# Patient Record
Sex: Male | Born: 1955
Health system: Southern US, Community
[De-identification: ages and names within clinical notes are randomized; demographics above are authoritative.]

## PROBLEM LIST (undated history)

## (undated) DIAGNOSIS — G8929 Other chronic pain: Secondary | ICD-10-CM

## (undated) DIAGNOSIS — H35039 Hypertensive retinopathy, unspecified eye: Secondary | ICD-10-CM

## (undated) DIAGNOSIS — Z1509 Genetic susceptibility to other malignant neoplasm: Secondary | ICD-10-CM

## (undated) DIAGNOSIS — Z86718 Personal history of other venous thrombosis and embolism: Secondary | ICD-10-CM

## (undated) DIAGNOSIS — I10 Essential (primary) hypertension: Secondary | ICD-10-CM

## (undated) DIAGNOSIS — F419 Anxiety disorder, unspecified: Secondary | ICD-10-CM

## (undated) DIAGNOSIS — N200 Calculus of kidney: Secondary | ICD-10-CM

## (undated) DIAGNOSIS — I2510 Atherosclerotic heart disease of native coronary artery without angina pectoris: Secondary | ICD-10-CM

## (undated) DIAGNOSIS — F17200 Nicotine dependence, unspecified, uncomplicated: Secondary | ICD-10-CM

## (undated) DIAGNOSIS — M1990 Unspecified osteoarthritis, unspecified site: Secondary | ICD-10-CM

## (undated) DIAGNOSIS — H269 Unspecified cataract: Secondary | ICD-10-CM

## (undated) DIAGNOSIS — D039 Melanoma in situ, unspecified: Secondary | ICD-10-CM

## (undated) DIAGNOSIS — K219 Gastro-esophageal reflux disease without esophagitis: Secondary | ICD-10-CM

## (undated) DIAGNOSIS — C649 Malignant neoplasm of unspecified kidney, except renal pelvis: Secondary | ICD-10-CM

## (undated) DIAGNOSIS — Z9289 Personal history of other medical treatment: Secondary | ICD-10-CM

## (undated) DIAGNOSIS — Z981 Arthrodesis status: Secondary | ICD-10-CM

## (undated) DIAGNOSIS — F32A Depression, unspecified: Secondary | ICD-10-CM

## (undated) DIAGNOSIS — F329 Major depressive disorder, single episode, unspecified: Secondary | ICD-10-CM

## (undated) DIAGNOSIS — Z79899 Other long term (current) drug therapy: Secondary | ICD-10-CM

## (undated) DIAGNOSIS — J449 Chronic obstructive pulmonary disease, unspecified: Secondary | ICD-10-CM

## (undated) DIAGNOSIS — G709 Myoneural disorder, unspecified: Secondary | ICD-10-CM

## (undated) DIAGNOSIS — C4359 Malignant melanoma of other part of trunk: Secondary | ICD-10-CM

## (undated) DIAGNOSIS — M549 Dorsalgia, unspecified: Secondary | ICD-10-CM

## (undated) DIAGNOSIS — D219 Benign neoplasm of connective and other soft tissue, unspecified: Secondary | ICD-10-CM

## (undated) DIAGNOSIS — E559 Vitamin D deficiency, unspecified: Secondary | ICD-10-CM

## (undated) DIAGNOSIS — C159 Malignant neoplasm of esophagus, unspecified: Secondary | ICD-10-CM

## (undated) DIAGNOSIS — E785 Hyperlipidemia, unspecified: Secondary | ICD-10-CM

## (undated) DIAGNOSIS — IMO0002 Reserved for concepts with insufficient information to code with codable children: Secondary | ICD-10-CM

## (undated) DIAGNOSIS — K635 Polyp of colon: Secondary | ICD-10-CM

## (undated) HISTORY — DX: Melanoma in situ, unspecified: D03.9

## (undated) HISTORY — DX: Other long term (current) drug therapy: Z79.899

## (undated) HISTORY — PX: ANTERIOR CRUCIATE LIGAMENT REPAIR: SHX115

## (undated) HISTORY — DX: Unspecified osteoarthritis, unspecified site: M19.90

## (undated) HISTORY — PX: MELANOMA EXCISION: SHX5266

## (undated) HISTORY — DX: Personal history of other venous thrombosis and embolism: Z86.718

## (undated) HISTORY — DX: Gastro-esophageal reflux disease without esophagitis: K21.9

## (undated) HISTORY — DX: Essential (primary) hypertension: I10

## (undated) HISTORY — DX: Hyperlipidemia, unspecified: E78.5

## (undated) HISTORY — DX: Polyp of colon: K63.5

## (undated) HISTORY — DX: Hypertensive retinopathy, unspecified eye: H35.039

## (undated) HISTORY — PX: HAND LIGAMENT RECONSTRUCTION: SHX1726

## (undated) HISTORY — DX: Anxiety disorder, unspecified: F41.9

## (undated) HISTORY — DX: Malignant neoplasm of esophagus, unspecified: C15.9

## (undated) HISTORY — DX: Arthrodesis status: Z98.1

## (undated) HISTORY — DX: Reserved for concepts with insufficient information to code with codable children: IMO0002

## (undated) HISTORY — PX: CARDIAC CATHETERIZATION: SHX172

## (undated) HISTORY — DX: Myoneural disorder, unspecified: G70.9

## (undated) HISTORY — DX: Unspecified cataract: H26.9

## (undated) HISTORY — PX: LIGAMENT REPAIR: SHX5444

## (undated) HISTORY — DX: Benign neoplasm of connective and other soft tissue, unspecified: D21.9

## (undated) HISTORY — DX: Vitamin D deficiency, unspecified: E55.9

## (undated) HISTORY — PX: SPINE SURGERY: SHX786

## (undated) HISTORY — DX: Chronic obstructive pulmonary disease, unspecified: J44.9

## (undated) HISTORY — DX: Calculus of kidney: N20.0

## (undated) HISTORY — DX: Nicotine dependence, unspecified, uncomplicated: F17.200

## (undated) LAB — BASIC METABOLIC PANEL: Sodium: 141 (ref 137–147)

---
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
## 1990-06-19 HISTORY — PX: HARVEST BONE GRAFT: SHX377

## 1990-06-19 HISTORY — PX: ANTERIOR CERVICAL DECOMP/DISCECTOMY FUSION: SHX1161

## 1998-05-04 ENCOUNTER — Ambulatory Visit: Admission: RE | Admit: 1998-05-04 | Discharge: 1998-05-04 | Payer: Self-pay | Admitting: Internal Medicine

## 1999-06-27 ENCOUNTER — Ambulatory Visit (HOSPITAL_COMMUNITY): Admission: RE | Admit: 1999-06-27 | Discharge: 1999-06-27 | Payer: Self-pay | Admitting: Orthopedic Surgery

## 1999-06-27 ENCOUNTER — Encounter: Payer: Self-pay | Admitting: Orthopedic Surgery

## 1999-07-12 ENCOUNTER — Ambulatory Visit (HOSPITAL_BASED_OUTPATIENT_CLINIC_OR_DEPARTMENT_OTHER): Admission: RE | Admit: 1999-07-12 | Discharge: 1999-07-12 | Payer: Self-pay | Admitting: Orthopedic Surgery

## 2002-06-19 HISTORY — PX: INGUINAL HERNIA REPAIR: SUR1180

## 2003-01-30 ENCOUNTER — Ambulatory Visit (HOSPITAL_BASED_OUTPATIENT_CLINIC_OR_DEPARTMENT_OTHER): Admission: RE | Admit: 2003-01-30 | Discharge: 2003-01-30 | Payer: Self-pay

## 2006-03-16 ENCOUNTER — Ambulatory Visit: Payer: Self-pay | Admitting: Internal Medicine

## 2006-03-29 ENCOUNTER — Ambulatory Visit: Payer: Self-pay | Admitting: Internal Medicine

## 2006-03-29 ENCOUNTER — Encounter (INDEPENDENT_AMBULATORY_CARE_PROVIDER_SITE_OTHER): Payer: Self-pay | Admitting: *Deleted

## 2008-09-26 ENCOUNTER — Emergency Department (HOSPITAL_COMMUNITY): Admission: EM | Admit: 2008-09-26 | Discharge: 2008-09-27 | Payer: Self-pay | Admitting: Emergency Medicine

## 2008-09-26 IMAGING — CR DG CHEST 2V
2 series · 2 of 2 positions shown · non-contrast
Comparison: None

CLINICAL DATA: Cough

CHEST - 2 VIEW

[w chest pa]
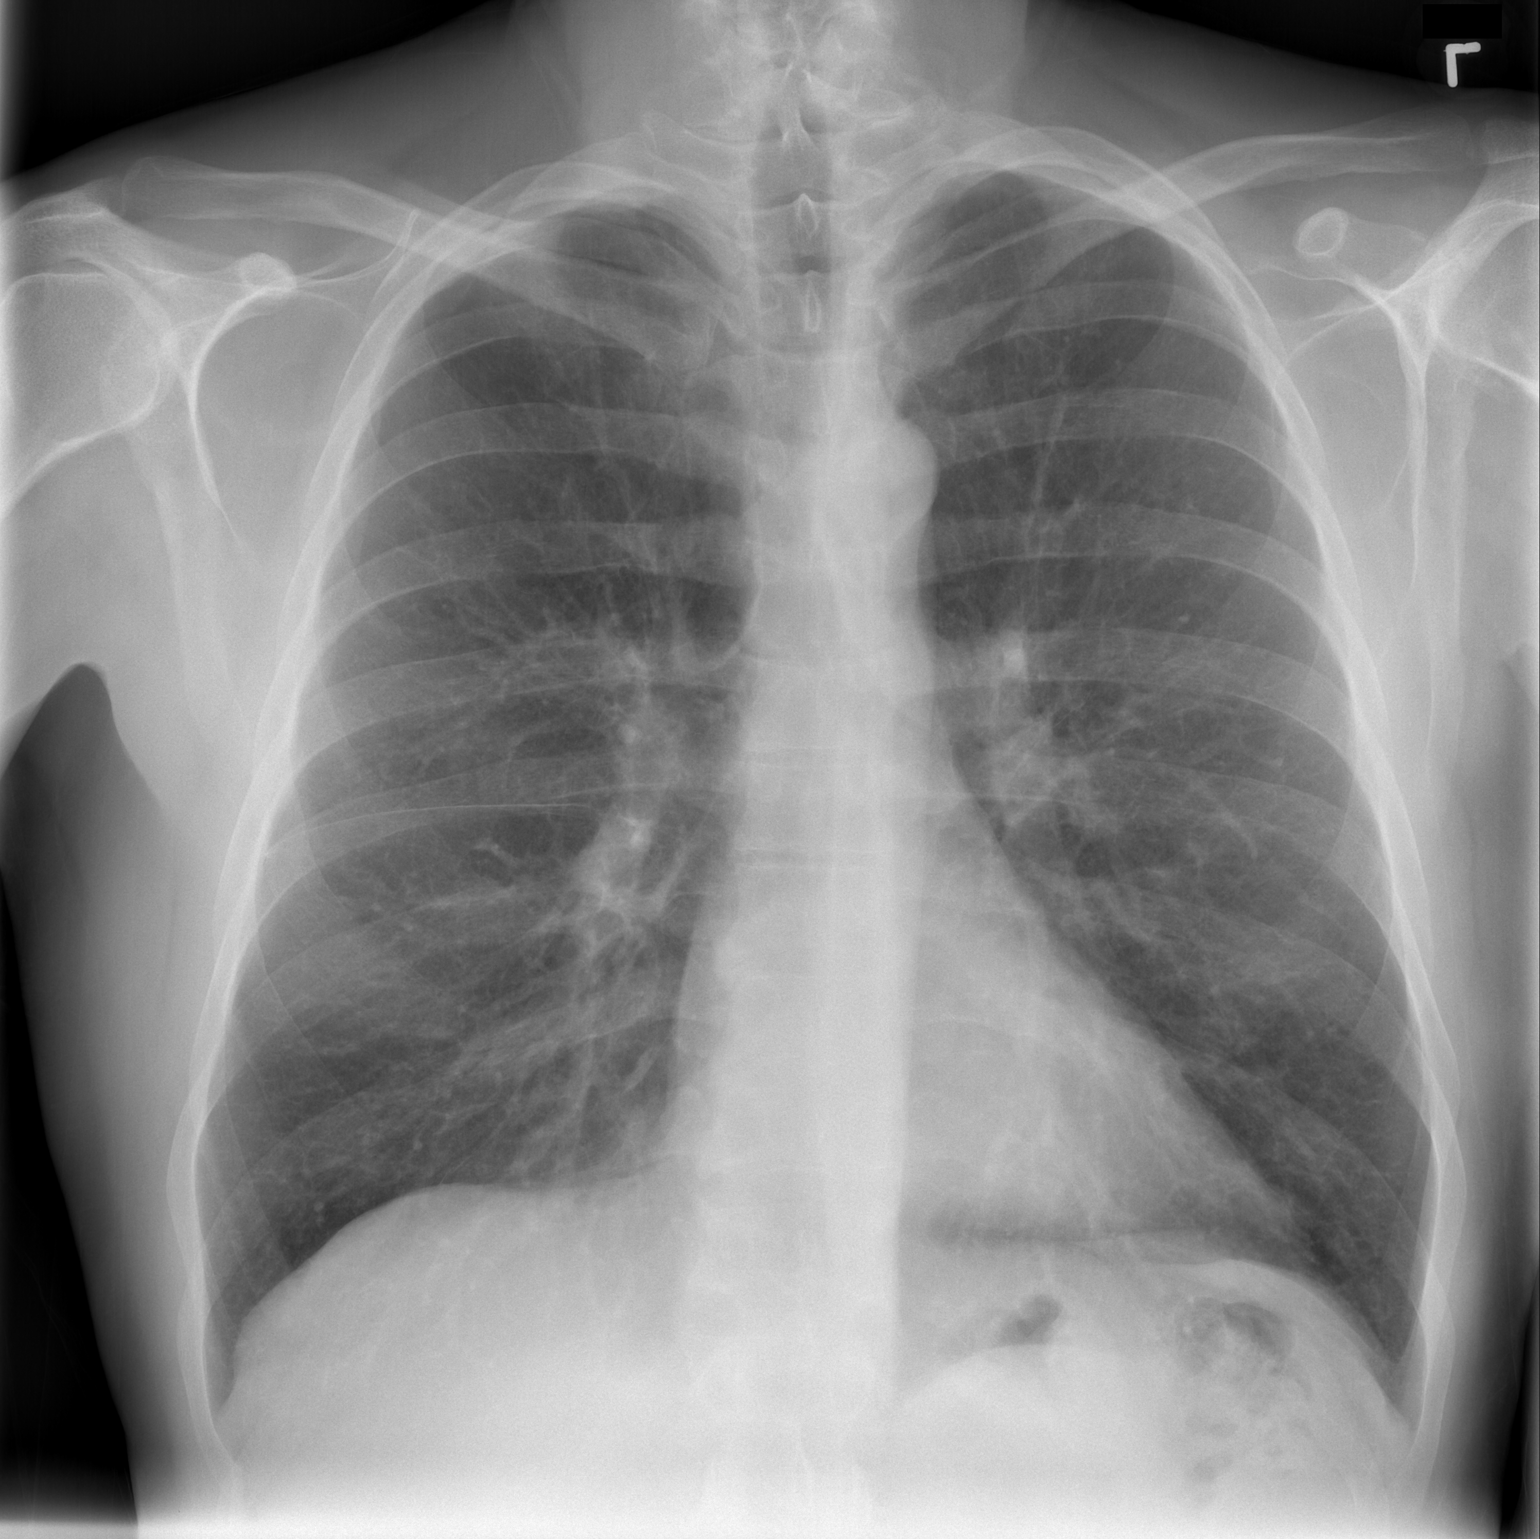

[w chest lat]
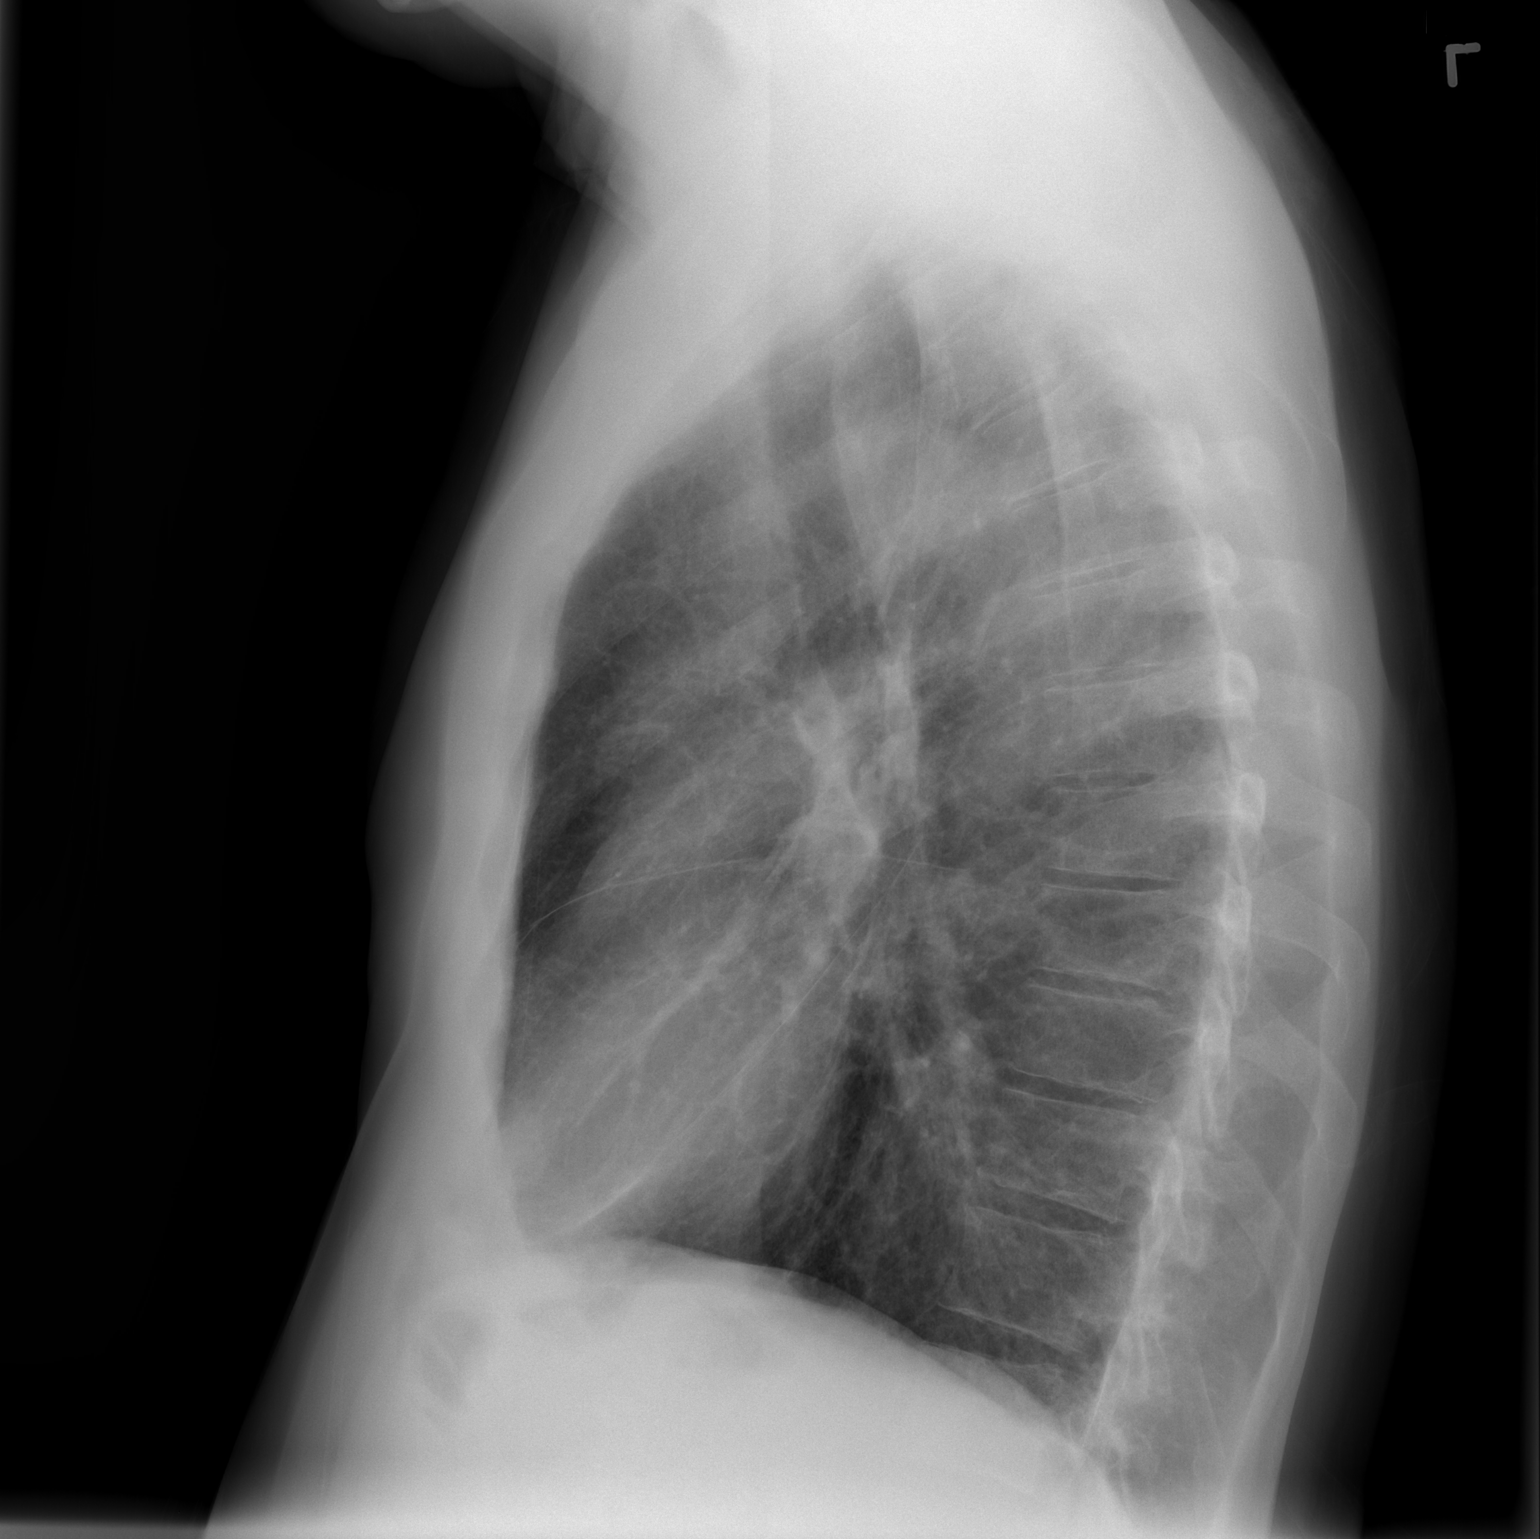

[2 of 2 positions shown; findings below may reference images not displayed]

FINDINGS: The lungs are clear.  The heart and mediastinal contours
are normal.  Osseous structures unremarkable.
IMPRESSION: No acute cardiopulmonary process.

## 2008-10-16 ENCOUNTER — Encounter: Admission: RE | Admit: 2008-10-16 | Discharge: 2008-10-16 | Payer: Self-pay | Admitting: Emergency Medicine

## 2008-10-16 IMAGING — CT CT CHEST W/O CM
2 of 3 series · 15 of 36 positions shown, 18 images · non-contrast
Comparison: Chest x-ray [DATE]

CLINICAL DATA: Right lower lobe pulmonary nodule.

CT CHEST WITHOUT CONTRAST
TECHNIQUE: Multidetector CT imaging of the chest was performed
following the standard protocol without IV contrast.

[Series 2: wo · axial · 0.73mm/px · z∈[+1258,+1578]mm · 12 of 76 slices shown, 15 images]
[im 6/76  mediastinal]
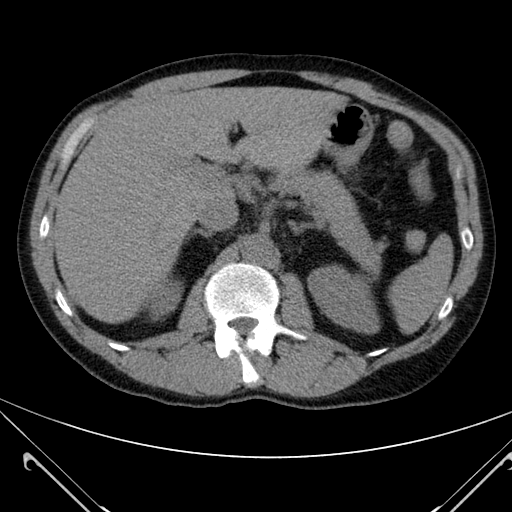
[im 6/76  lung]
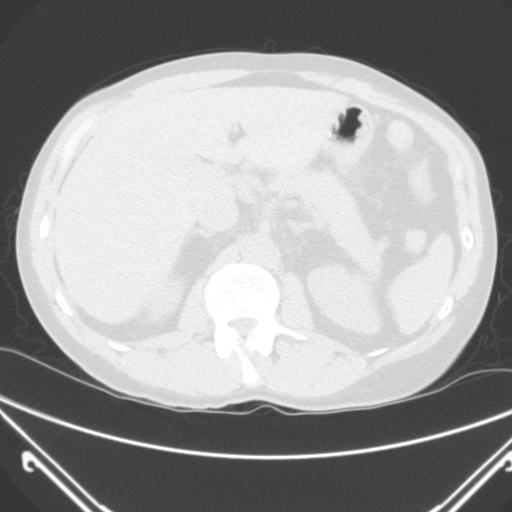
[im 12/76  lung]
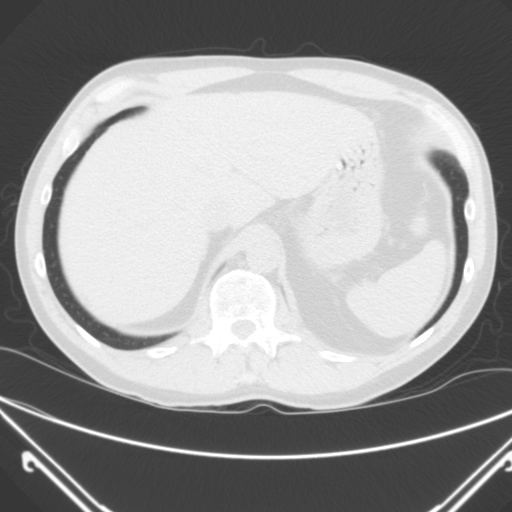
[im 17/76  lung]
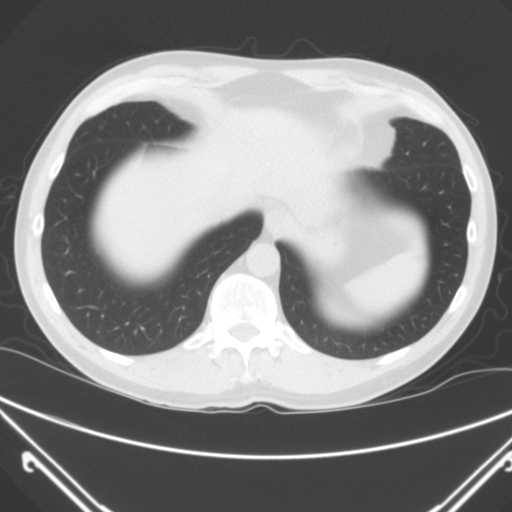
[im 23/76  lung]
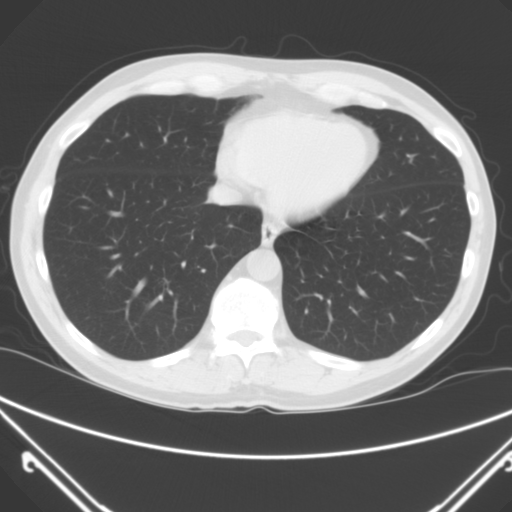
[im 28/76  mediastinal]
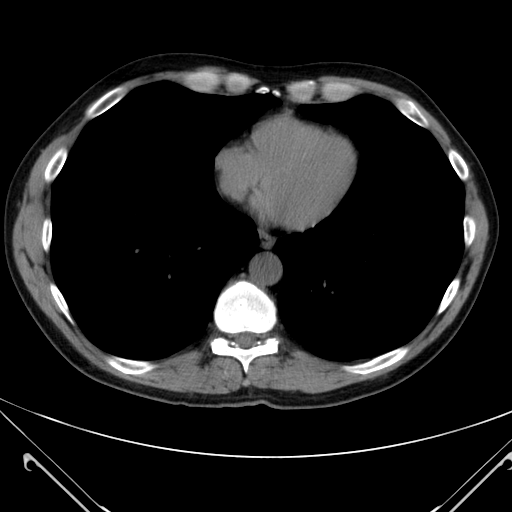
[im 28/76  lung]
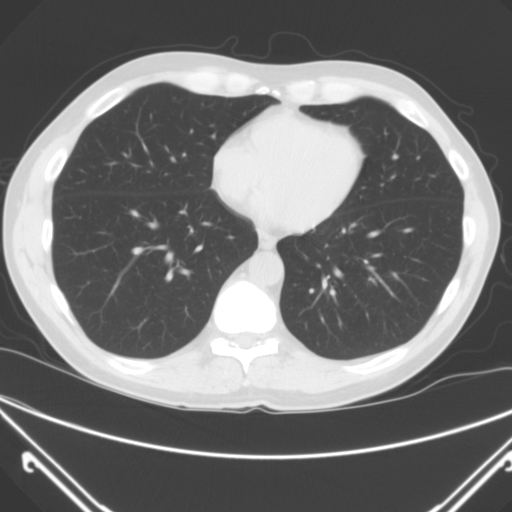
[im 34/76  lung]
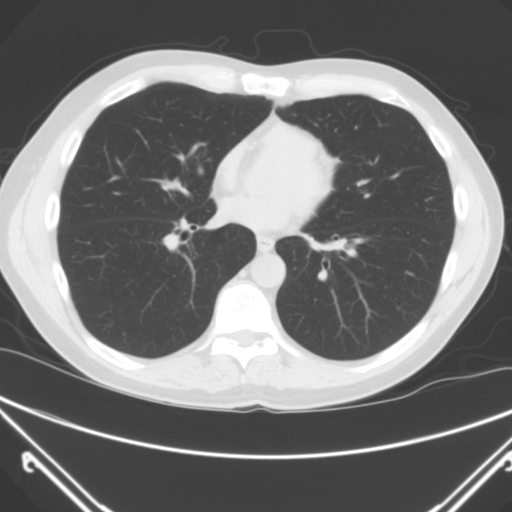
[im 42/76  lung]
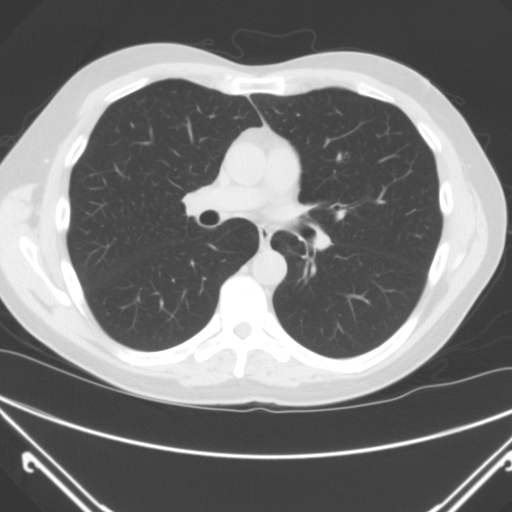
[im 48/76  lung]
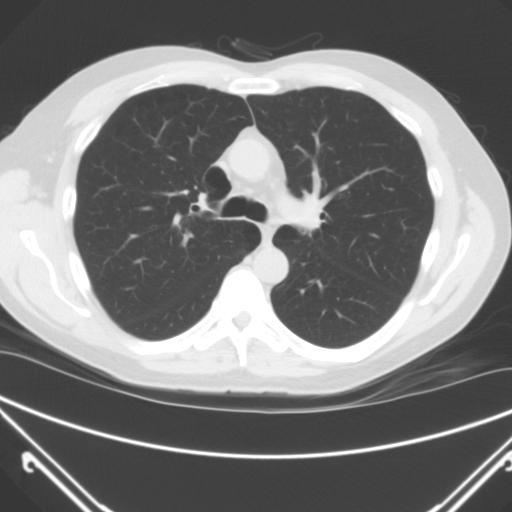
[im 53/76  mediastinal]
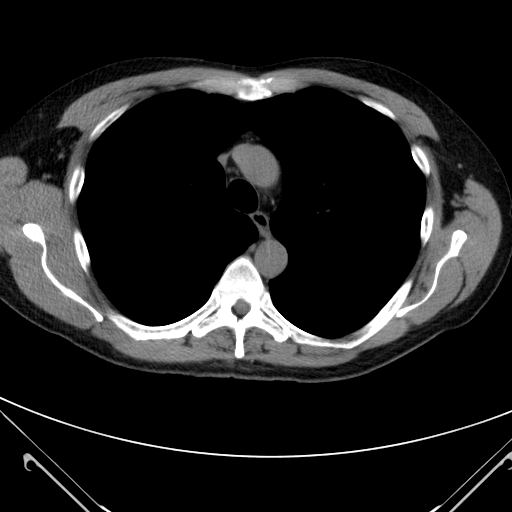
[im 53/76  lung]
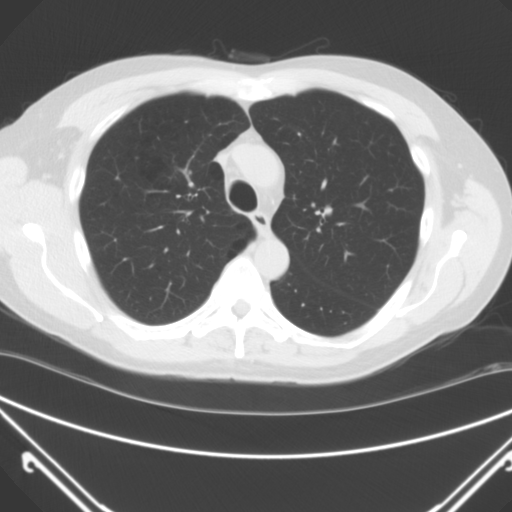
[im 59/76  lung]
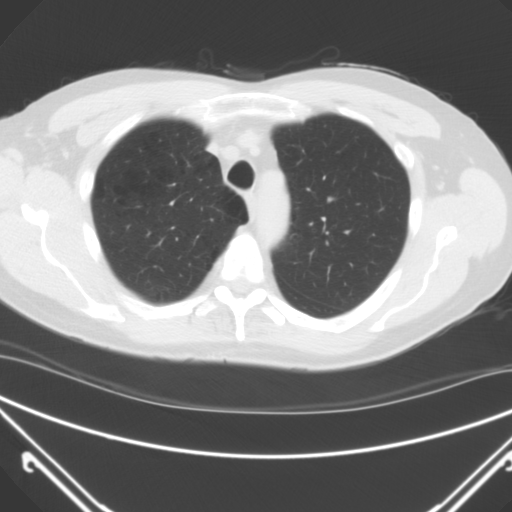
[im 64/76  lung]
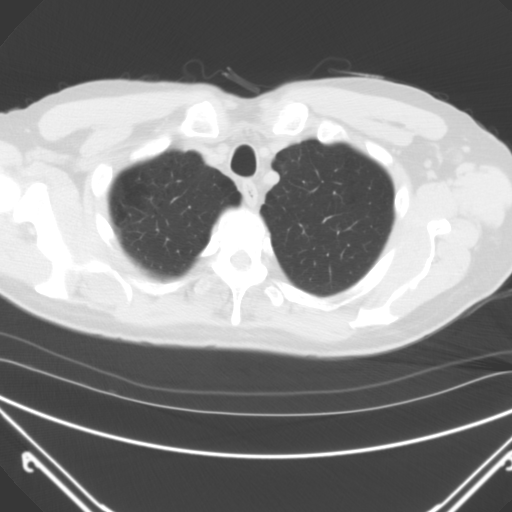
[im 70/76  lung]
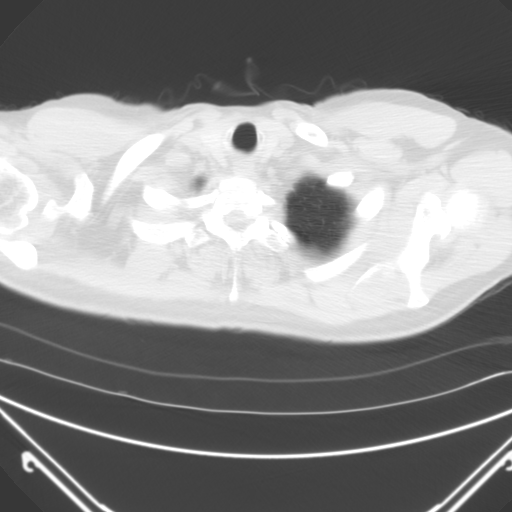

[coronals · coronal · 0.73mm/px · 3 of 100 slices shown]
[im 20/100  lung]
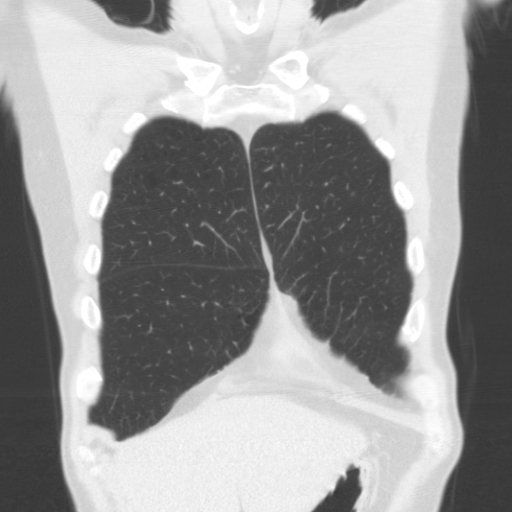
[im 40/100  lung]
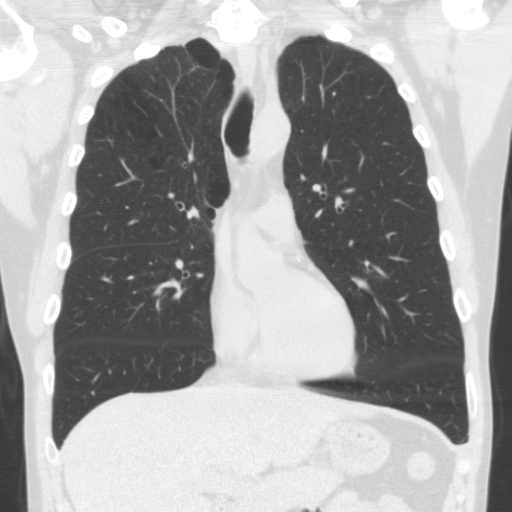
[im 60/100  lung]
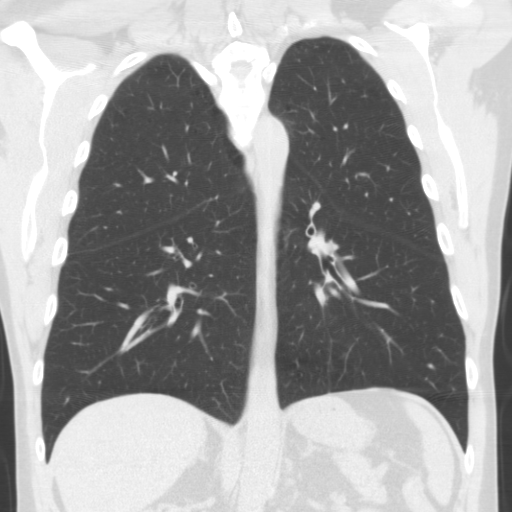

[15 of 36 positions shown; findings below may reference images not displayed]

FINDINGS: The chest wall is unremarkable.  No supraclavicular or
axillary adenopathy.  The bony thorax is intact.

The heart is normal in size.  No pericardial effusion.  No
mediastinal or hilar adenopathy.  The aorta is normal in caliber.
There are a few scattered coronary artery calcifications noted.
The esophagus is unremarkable.

Examination of the lung parenchyma demonstrates no acute pulmonary
findings.  There are early changes of COPD in the upper lobes
bilaterally with bullous changes.  No pulmonary mass lesions or
worrisome pulmonary nodules.  No pleural effusions.  The
tracheobronchial tree appears normal.

The upper abdomen demonstrates no significant findings.
IMPRESSION: 1.  No pulmonary mass lesions or nodules are identified.
2.  COPD changes.
3.  Normal CT appearance of the heart and great vessels.
4.  No mediastinal or hilar adenopathy.

## 2009-02-05 ENCOUNTER — Encounter: Admission: RE | Admit: 2009-02-05 | Discharge: 2009-02-05 | Payer: Self-pay | Admitting: Emergency Medicine

## 2009-02-05 IMAGING — CT CT PARANASAL SINUSES LIMITED
1 series · 15 of 17 positions shown, 19 images · non-contrast
Comparison: None

CLINICAL DATA: Chronic congestion, history of reconstructive nasal
surgery

CT PARANASAL SINUS LIMITED WITHOUT CONTRAST
TECHNIQUE: Multidetector CT images of the paranasal sinuses were
obtained in a single plane without contrast.

[Series 2: sinus · axial · 0.33mm/px · z∈[-352,-251]mm · 15 of 17 slices shown, 19 images]
[im 2/17  brain]
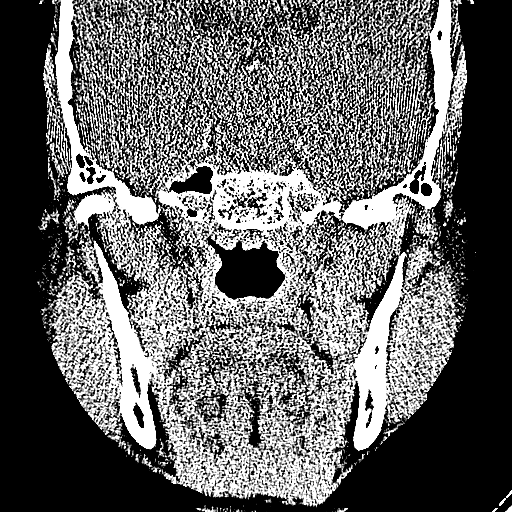
[im 2/17  bone]
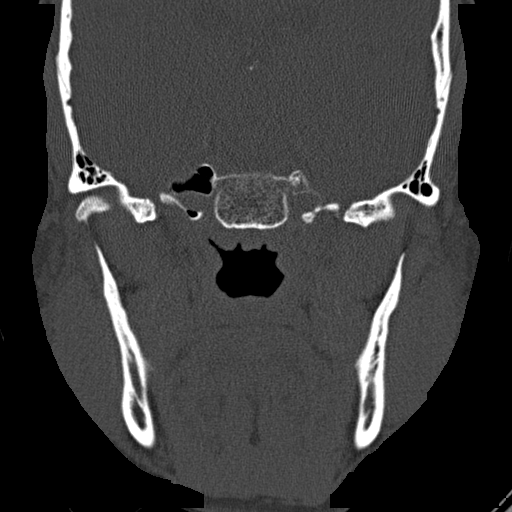
[im 3/17  bone]
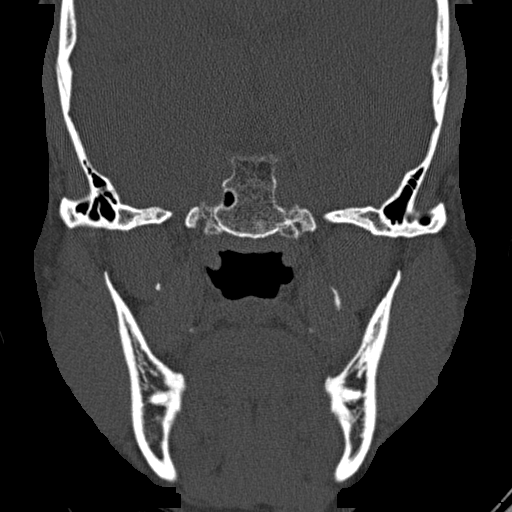
[im 4/17  bone]
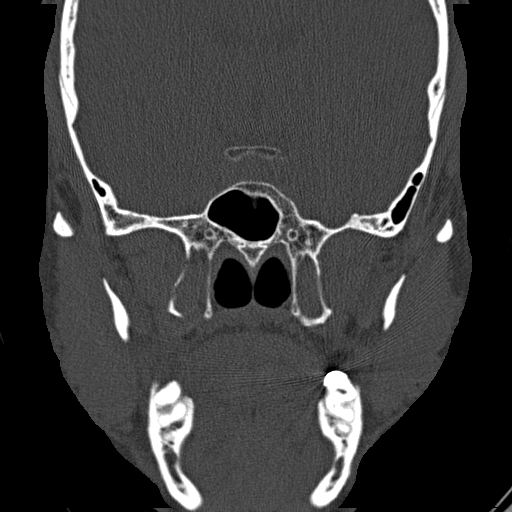
[im 5/17  bone]
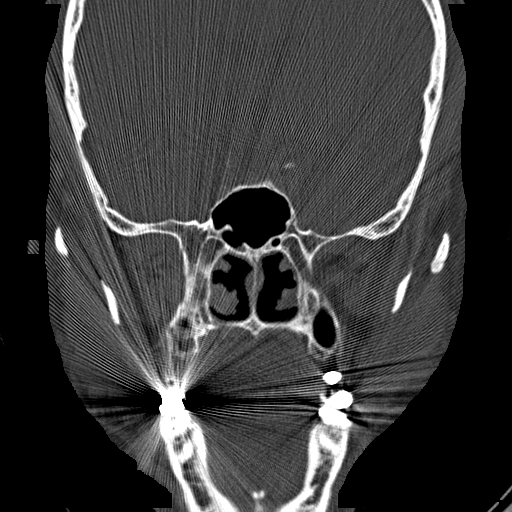
[im 6/17  brain]
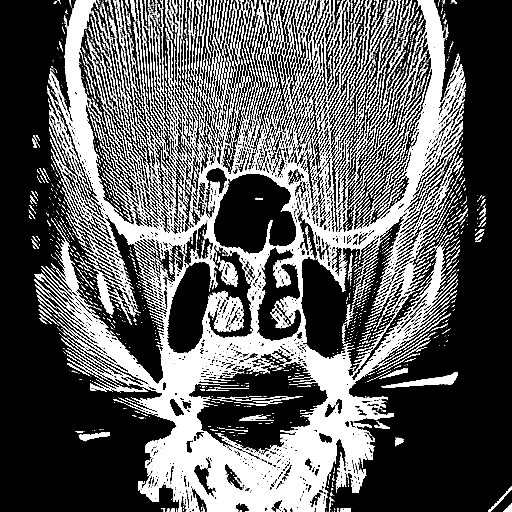
[im 6/17  bone]
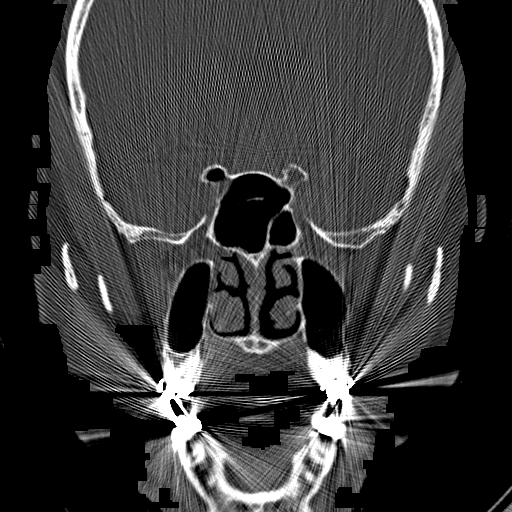
[im 7/17  bone]
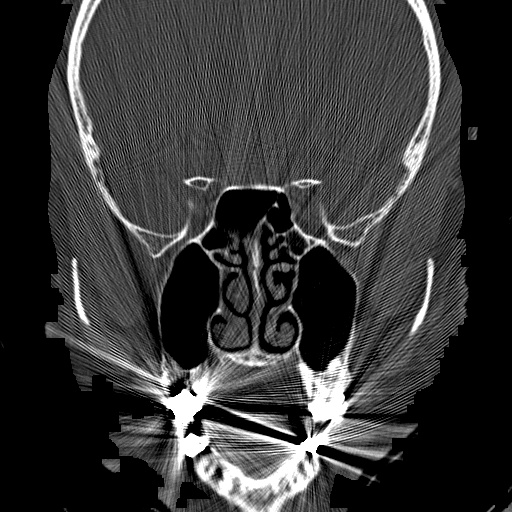
[im 8/17  bone]
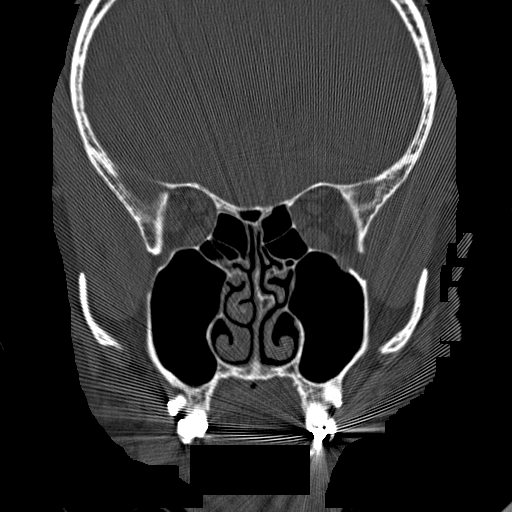
[im 9/17  bone]
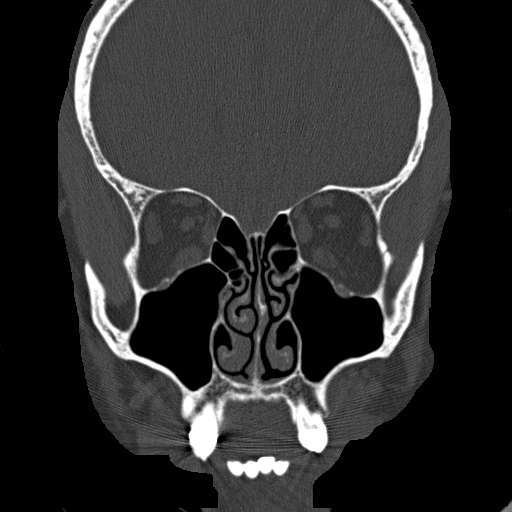
[im 10/17  brain]
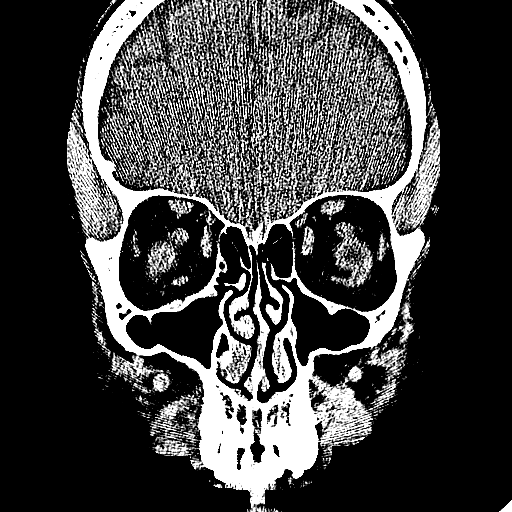
[im 10/17  bone]
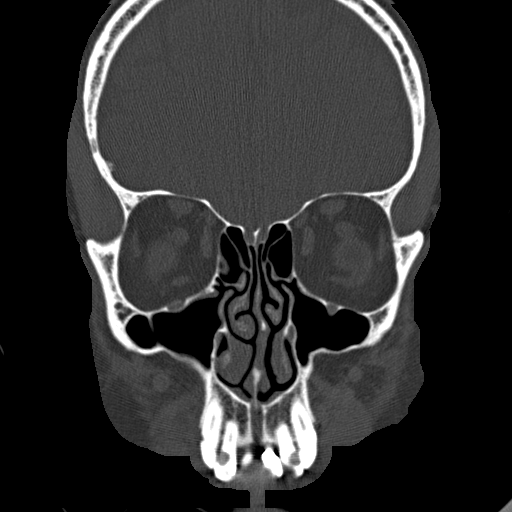
[im 11/17  bone]
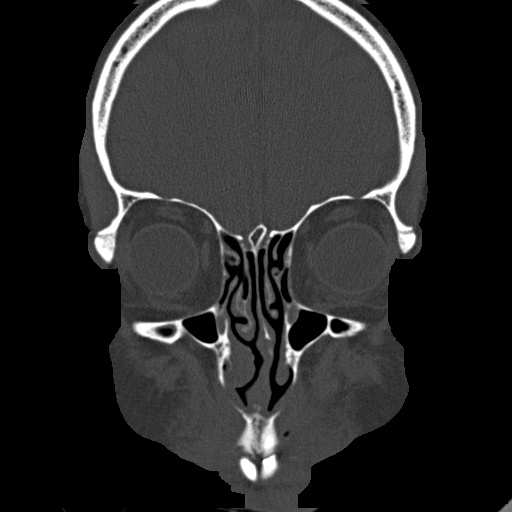
[im 12/17  bone]
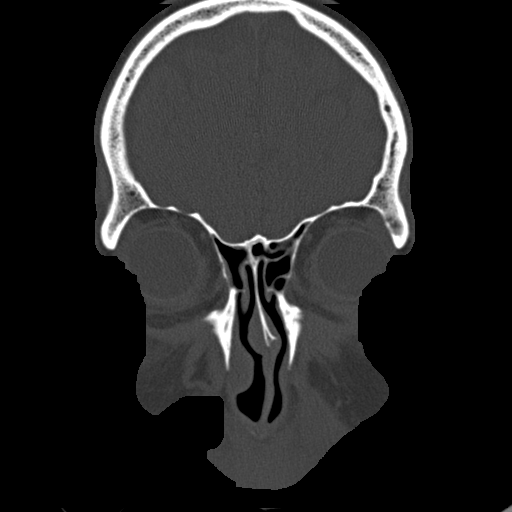
[im 13/17  bone]
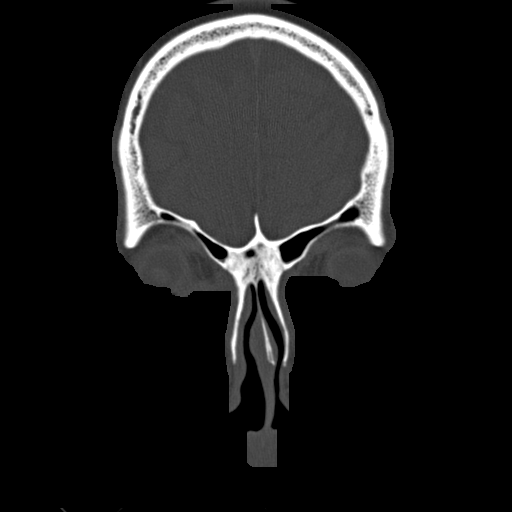
[im 14/17  brain]
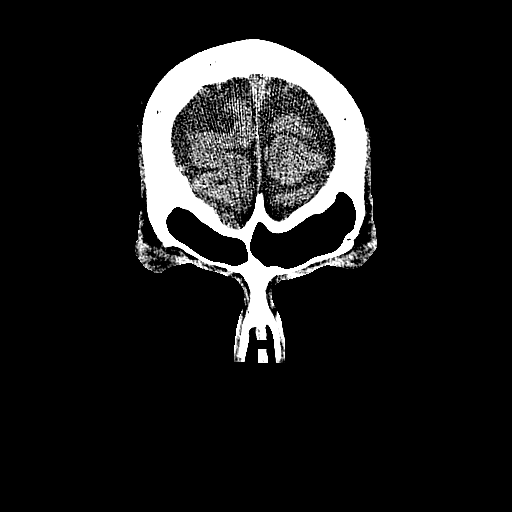
[im 14/17  bone]
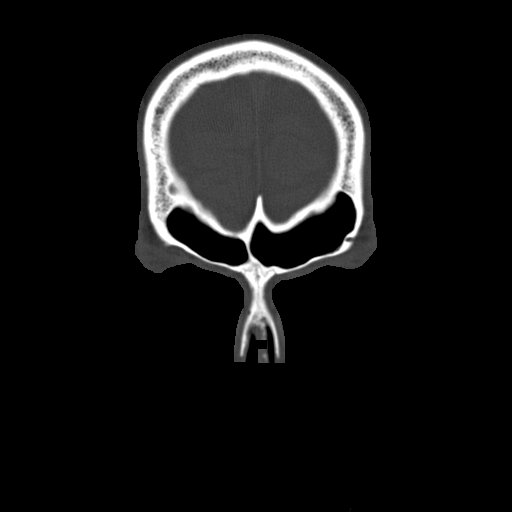
[im 15/17  bone]
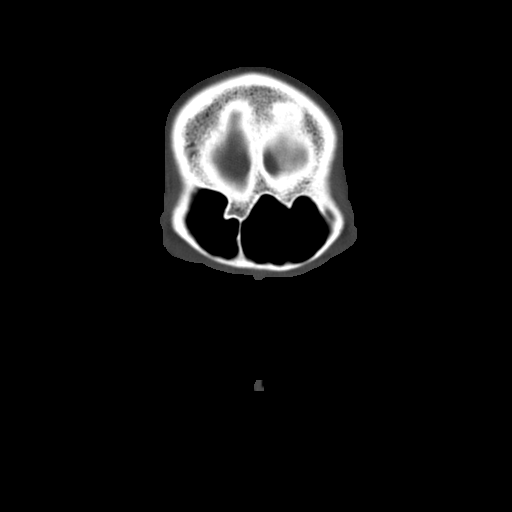
[im 16/17  bone]
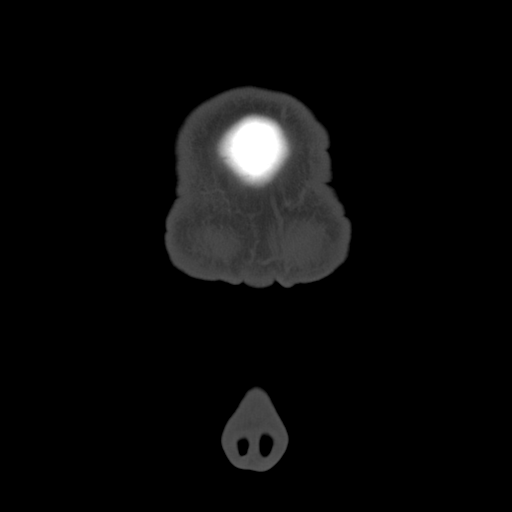

[15 of 17 positions shown; findings below may reference images not displayed]

FINDINGS: The paranasal sinuses are well pneumatized.  There is no
evidence of sinusitis.  The nasal turbinates are slightly
prominent, somewhat compromising the nasal airway, right more so
than left.  No bony abnormality is seen.
IMPRESSION: 1.  No evidence of sinusitis.
2.  Slight narrowing of the right nasal airway by somewhat
prominent turbinates.

## 2010-02-04 ENCOUNTER — Encounter: Admission: RE | Admit: 2010-02-04 | Discharge: 2010-02-04 | Payer: Self-pay | Admitting: Emergency Medicine

## 2010-02-04 IMAGING — CT CT ABD-PELV W/ CM
2 of 5 series · 16 of 46 positions shown, 18 images · IV contrast (READICAT/WATER & [ID] OMNI 300)
Comparison: None.

CLINICAL DATA: Recent skin biopsy from the left upper arm
suspicious for leiomyosarcoma.  Unknown primary.  History of
melanoma removed from his back approximately 4 years ago.  Surgical
history includes right inguinal hernia repair.

CT ABDOMEN AND PELVIS WITH CONTRAST [DATE]:
TECHNIQUE: Multidetector CT imaging of the abdomen and pelvis was
performed following the standard protocol during bolus
administration of intravenous contrast.
Contrast: 100 ml [L7] IV.  Dilute Omnipaque as oral
contrast.

[Series 2: abdomen w/ · axial · 0.75mm/px · z∈[-439,-84]mm · 13 of 80 slices shown, 15 images]
[im 5/80  soft-tissue]
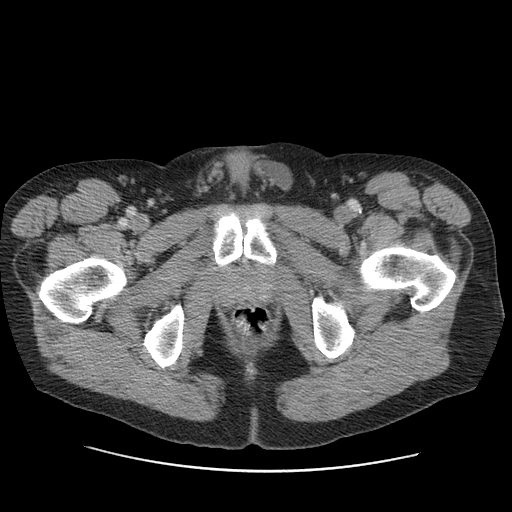
[im 5/80  bone]
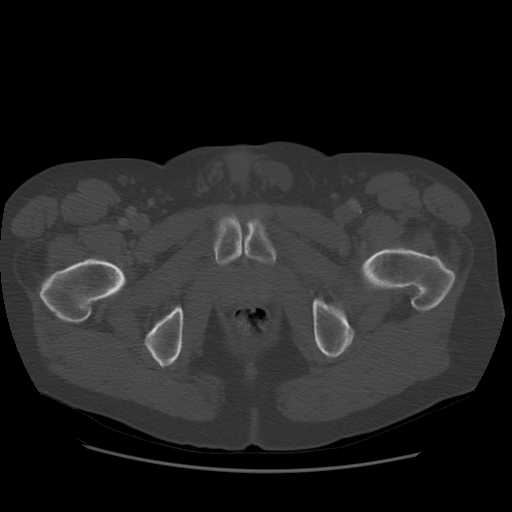
[im 9/80  soft-tissue]
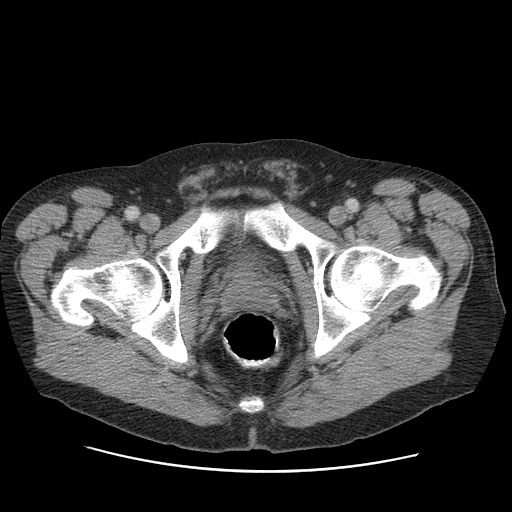
[im 18/80  soft-tissue]
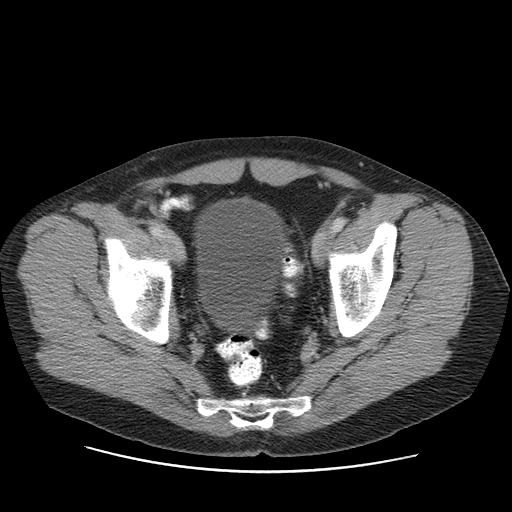
[im 22/80  soft-tissue]
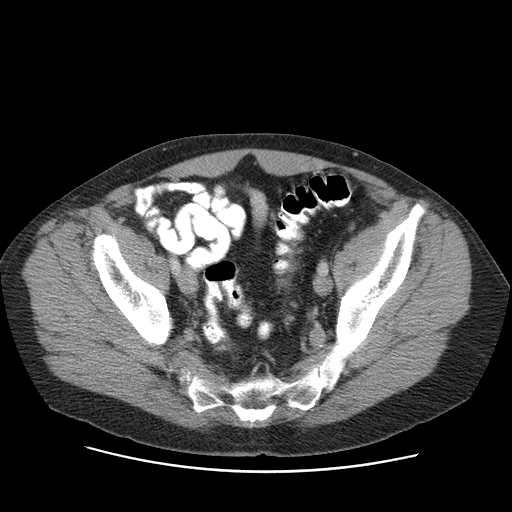
[im 27/80  soft-tissue]
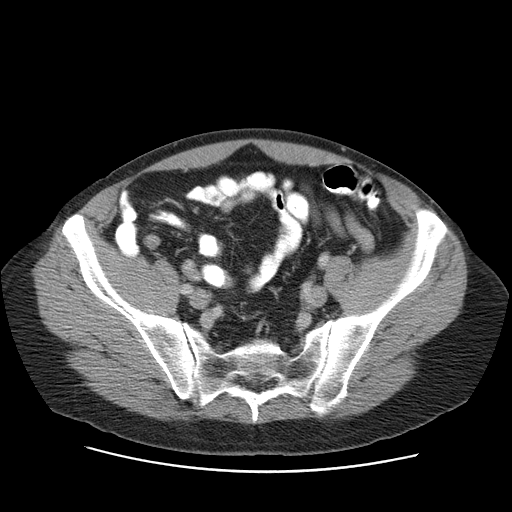
[im 36/80  soft-tissue]
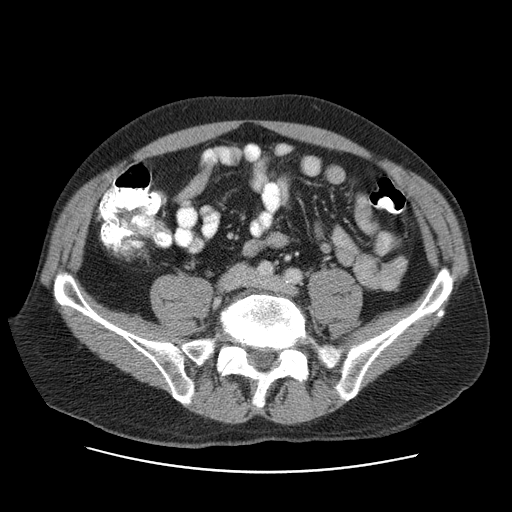
[im 40/80  soft-tissue]
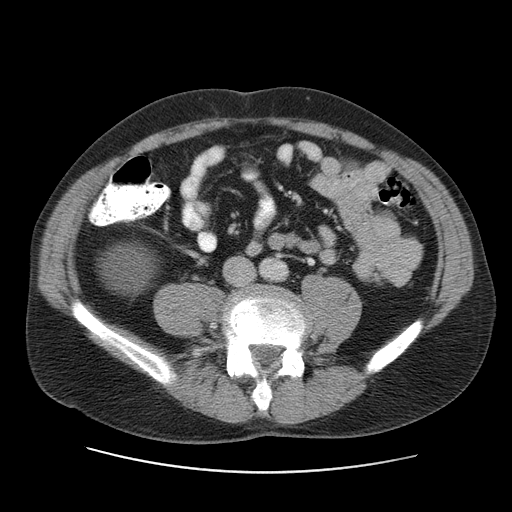
[im 44/80  soft-tissue]
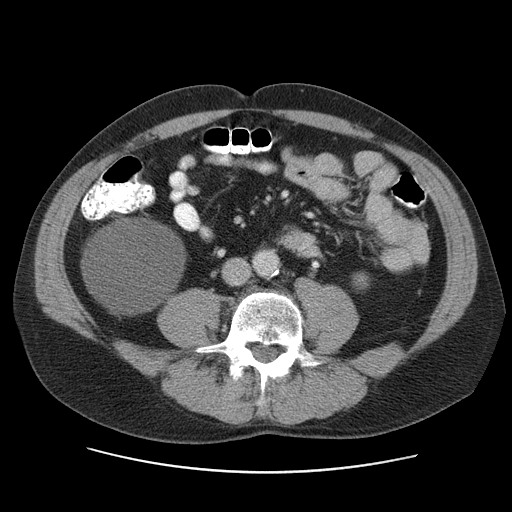
[im 53/80  soft-tissue]
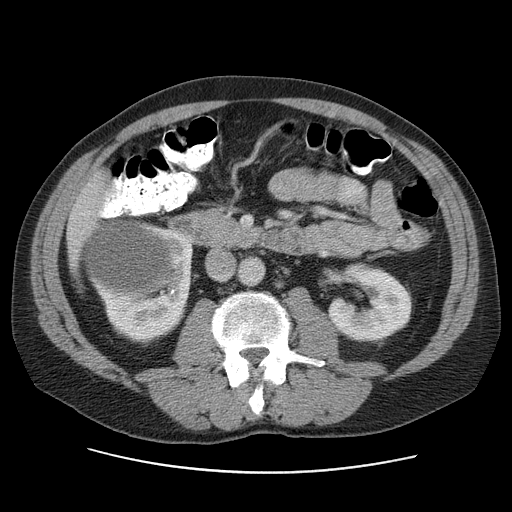
[im 53/80  bone]
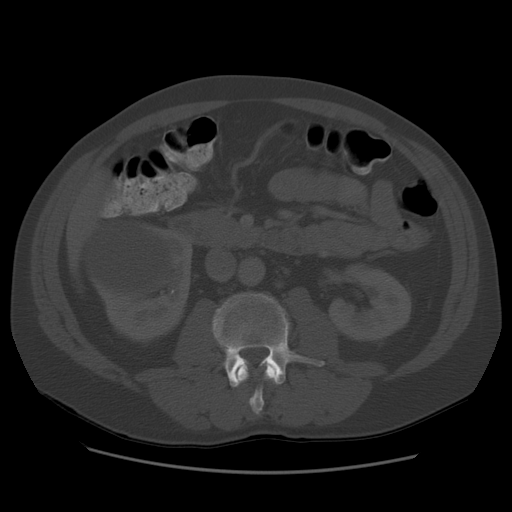
[im 58/80  soft-tissue]
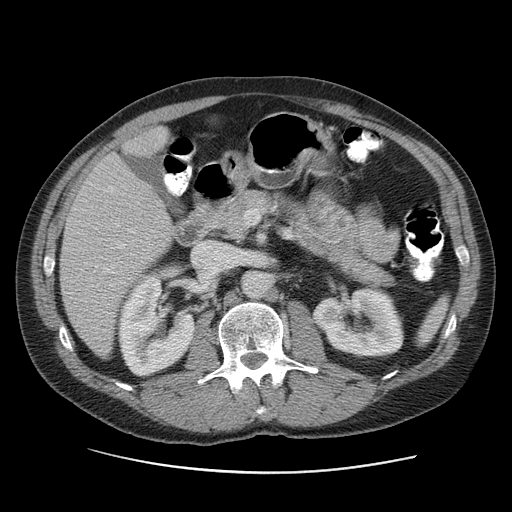
[im 62/80  soft-tissue]
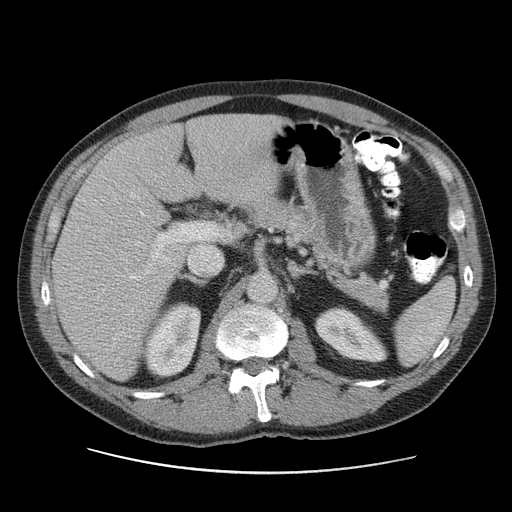
[im 71/80  soft-tissue]
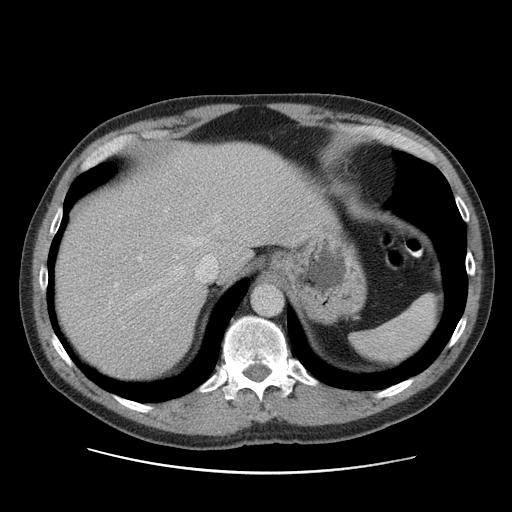
[im 75/80  soft-tissue]
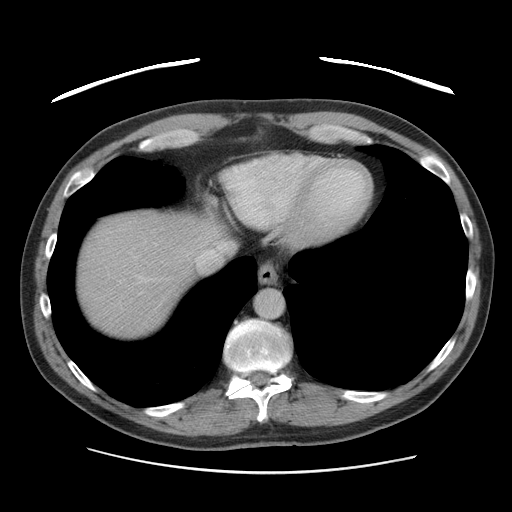

[Series 400: coronal · coronal · 0.85mm/px · 3 of 126 slices shown]
[im 42/126  soft-tissue]
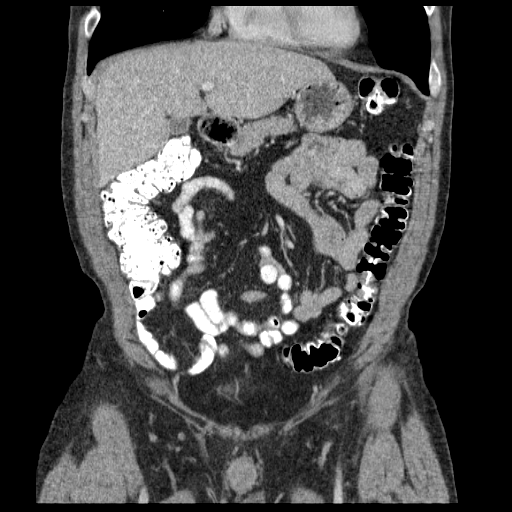
[im 56/126  soft-tissue]
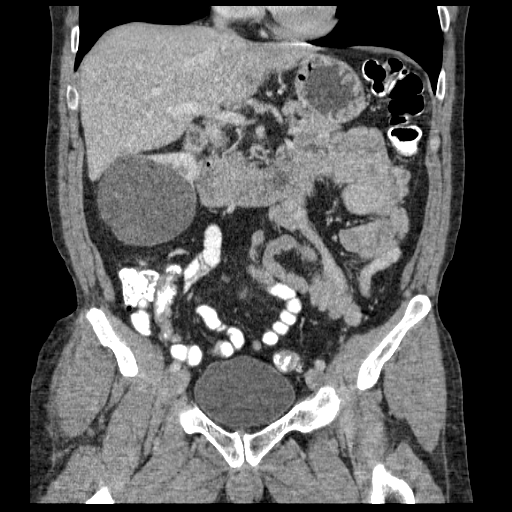
[im 70/126  soft-tissue]
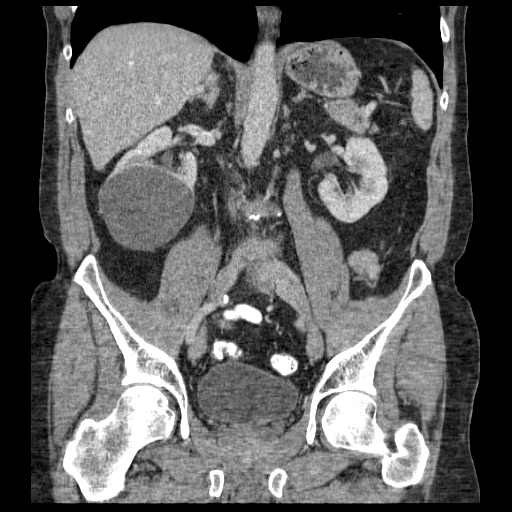

[16 of 46 positions shown; findings below may reference images not displayed]

FINDINGS: No masses identified within the abdomen or pelvis to
suggest a primary lesion.  Normal appearing liver, spleen,
pancreas, adrenal glands, and left kidney.  Focus of accessory
splenic tissue anterior to the lower pole of the spleen.
Approximate 8-9 cm cyst arising from the lower pole of the right
kidney.  Approximate 4 mm non-obstructing calculus in a lower pole
calix of the right kidney.  Gallbladder contracted and unremarkable
by CT.  No biliary ductal dilation.

Stomach normal in appearance by CT.  Small bowel and colon normal
in appearance.  Normal appendix in the right upper pelvis.  No
significant lymphadenopathy.  No ascites.  Mild aorto-iliofemoral
atherosclerosis.  Patent visceral arteries.

Urinary bladder unremarkable.  Prostate gland and seminal vesicles
normal for age.  Bilateral scrotal hydroceles, left greater than
right, with fluid in the left inguinal canal.  No inguinal hernias.
Visualized lung bases clear.  Bone window images demonstrate
degenerative disc disease and spondylosis at L3-4, L4-5, and L5-S1
and diffuse facet degenerative changes throughout the lumbar spine.
IMPRESSION: 1.  No masses identified within the abdomen or pelvis to suggest a
primary tumor.  No evidence of metastatic disease in the abdomen or
pelvis.
2.  No acute abnormalities in the abdomen or pelvis.
3.  Approximate 8-9 cm cyst arising from the lower pole of the
right kidney.
4.  Non-obstructing approximate 4 mm right lower pole renal
calculus.
5.  Bilateral scrotal hydroceles, left greater than right.

## 2010-08-11 ENCOUNTER — Other Ambulatory Visit: Payer: Self-pay | Admitting: Dermatology

## 2010-09-28 LAB — COMPREHENSIVE METABOLIC PANEL
AST: 53 U/L — ABNORMAL HIGH (ref 0–37)
Albumin: 3.3 g/dL — ABNORMAL LOW (ref 3.5–5.2)
BUN: 12 mg/dL (ref 6–23)
CO2: 24 mEq/L (ref 19–32)
Calcium: 9.2 mg/dL (ref 8.4–10.5)
Chloride: 103 mEq/L (ref 96–112)
Creatinine, Ser: 1.09 mg/dL (ref 0.4–1.5)
GFR calc Af Amer: >60 mL/min (ref >60)
GFR calc non Af Amer: >60 mL/min (ref >60)
Total Bilirubin: 0.5 mg/dL (ref 0.3–1.2)

## 2010-09-28 LAB — CBC
HCT: 43.6 % (ref 39.0–52.0)
Hemoglobin: 15 g/dL (ref 13.0–17.0)
MCV: 95.7 fL (ref 78.0–100.0)
Platelets: 354 10*3/uL (ref 150–400)
RDW: 12.3 % (ref 11.5–15.5)

## 2010-09-28 LAB — DIFFERENTIAL
Basophils Absolute: 0 10*3/uL (ref 0.0–0.1)
Eosinophils Absolute: 0 10*3/uL (ref 0.0–0.7)
Eosinophils Relative: 0 % (ref 0–5)
Lymphocytes Relative: 17 % (ref 12–46)
Monocytes Absolute: 0.3 10*3/uL (ref 0.1–1.0)

## 2010-11-04 NOTE — Op Note (Signed)
[place]  Patient:    Douglas [name]                     MRN: [id number] Proc. Date: 07/12/99 Adm. Date:  [id number] Attending:  Ronne Binning CC:         Nicki Reaper, M.D. (2 copies)                           Operative Report  PREOPERATIVE DIAGNOSIS:  Rupture of radial collateral ligament, metacarpophalangeal joint, right little finger.  POSTOPERATIVE DIAGNOSIS:   Rupture of radial collateral ligament, metacarpophalangeal joint, right little finger.  OPERATION:  Repair radial collateral ligament, metacarpophalangeal joint, right  little finger.  SURGEON:  Nicki Reaper, M.D.  ASSISTANT:  Joaquin Courts, R.N.  ANESTHESIA:  Axillary block.  ANESTHESIOLOGIST:  Janetta Hora. Gelene Mink, M.D.  HISTORY:  The patient is a 55 year old male who suffered an injury to his right  little finger.  He has had instability. X-rays reveal a detachment of the radial collateral ligament, metacarpophalangeal joint, right little finger.  DESCRIPTION OF PROCEDURE:  The patient was brought to the operating room where n axillary block was carried out without  difficulty.  He was prepped and draped using Betadine scrub and solution with the right arm free.  The limb was exsanguinated with an Esmarch bandage.  Tourniquet placed high on the arm was inflated to 250 mmHg.  A curvilinear incision was made over the dorsum of the metacarpophalangeal joint, carrying the apex to the radial side, carried down through subcutaneous tissues.  Bleeders were electrocauterized.  The extensor tendons were protected.  An incision was made in the sagittal fibers over the radial side, carried down into the joint.  The joint was opened, and the ruptured collateral ligament was immediately apparent.  There was no attachment to the proximal phalanx.  The old attachment area was roughened with a curet.  Drill holes were then passed with a 3-5 K wire.  The ligament was freshened  on its end.  A Bunnell modified suture was placed into the tendon substance using 2-0 Prolene.  The suture was then passed to the ulnar side of the finger and tied over a button with a bolster made out of Webril protection of the skin.  The finger was placed in full flexion and pinned with a 3-5 K wire, confirmed with AP, lateral, and oblique x-rays.  The suture was then tied over the button, securing the collateral ligament back into the defect on the proximal phalanx.  The patient was copiously irrigated with saline, the capsule closed with interrupted 5-0 Mersilene sutures.  The extensor tendon was repaired with figure-of-eight 5-0 Mersilene, and the skin with interrupted 5-0 nylon sutures.  A sterile compressive dressing and splint was applied.  The patient tolerated the procedure well and was taken to the recovery room for observation in satisfactory condition.  He is discharged home to return to the [facility] in one week on Vicodin and Keflex. DD:  07/12/99 TD:  07/12/99 Job: [id number]

## 2010-11-04 NOTE — Op Note (Signed)
   NAME:  Douglas [name], [place] NO.:  [id number]   MEDICAL RECORD NO.:  [id number]                   PATIENT TYPE:  AMB   LOCATION:  [facility]SC                                  FACILITY:  [facility]   PHYSICIAN:  Lorre Munroe., M.D.            DATE OF BIRTH:  Nov 08, 1955   DATE OF PROCEDURE:  01/30/2003  DATE OF DISCHARGE:                                 OPERATIVE REPORT   PREOPERATIVE DIAGNOSIS:  Indirect right inguinal hernia.   POSTOPERATIVE DIAGNOSIS:  Indirect right inguinal hernia.   OPERATION:  Repair of right inguinal hernia.   SURGEON:  Zigmund Daniel, M.D.   ANESTHESIA:  Local with sedation.   PROCEDURE:  After the patient was monitored and sedated and had routine  preparation and draping of the right inguinal region, I liberally infused  local anesthetic in the area and achieved a good block.  I reinforced that  as I deepened my dissection.  I made a transverse 6 cm incision beginning  just above the pubic tubercle and dissected down to the external oblique  getting hemostasis with the Bovie.  I opened the external oblique in the  direction of its fibers and took care to avoid injury to the ilioinguinal  nerve.  I encircled the cord with a Penrose drain at the level of the pubic  tubercle and dissected it off the floor to the level of the inguinal ring.  There was no direct hernia.  I dissected the cord proximally and found a  large indirect sac which was easily reducible and contained no viscera at  the time of dissection.  I reduced it and then plugged the superior aspect  of the deep ring with a plug of polypropylene mesh held in place with a  single 2-0 silk stitch.  I fashioned a patch of polypropylene mesh to fit  the inguinal floor and sewed it from the pubic tubercle superiorly and  medially with a basting 2-0 Prolene stitch in the internal oblique fascia  and laterally and inferiorly with a running stitch in the shelving edge of  the  inguinal ligament.  I used a single suture to join the tails of the mesh  together lateral to the cord and I felt I had a good hernia repair.  Hemostasis was good.  I closed the external oblique and subcutaneous tissues  with separate running layers of 3-0 Vicryl and closed the skin with  interrupted intracuticular 4-0 Vicryl and Steri-Strips.  The patient  tolerated the procedure well.                                               Lorre Munroe., M.D.    WB/MEDQ  D:  01/30/2003  T:  01/30/2003  Job:  [id number]

## 2011-01-24 ENCOUNTER — Other Ambulatory Visit: Payer: Self-pay | Admitting: Emergency Medicine

## 2011-01-24 DIAGNOSIS — C649 Malignant neoplasm of unspecified kidney, except renal pelvis: Secondary | ICD-10-CM

## 2011-02-07 ENCOUNTER — Ambulatory Visit
Admission: RE | Admit: 2011-02-07 | Discharge: 2011-02-07 | Disposition: A | Discharge status: Home / Self Care | Payer: BC Managed Care – PPO | Source: Ambulatory Visit | Attending: Emergency Medicine | Admitting: Emergency Medicine

## 2011-02-07 DIAGNOSIS — C649 Malignant neoplasm of unspecified kidney, except renal pelvis: Secondary | ICD-10-CM

## 2011-02-07 IMAGING — CT CT ABD-PELV W/ CM
2 of 5 series · 17 of 46 positions shown, 19 images · IV contrast (READICAT/WATER & [ID] OMNI 300)
Comparison: CT abdomen and pelvis of [DATE]

CLINICAL DATA: No right renal cyst, history of leiomyosarcoma also
history of melanoma removed 4 years ago

CT ABDOMEN AND PELVIS WITH CONTRAST
TECHNIQUE: Multidetector CT imaging of the abdomen and pelvis was
performed following the standard protocol during bolus
administration of intravenous contrast.
Contrast: 100 ml [FS]

[Series 2: abdomen w/ · axial · 0.70mm/px · z∈[-416,-56]mm · 14 of 81 slices shown, 16 images]
[im 5/81  soft-tissue]
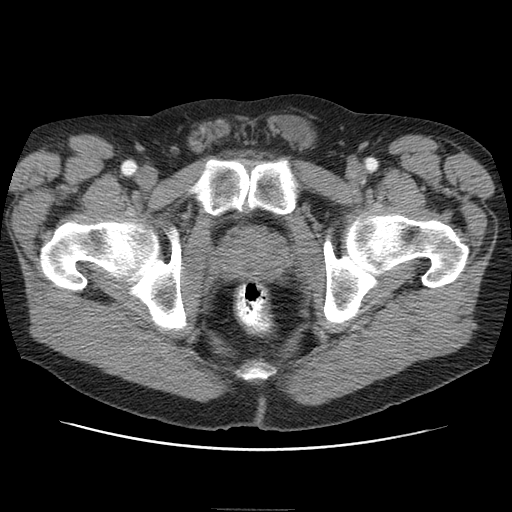
[im 5/81  bone]
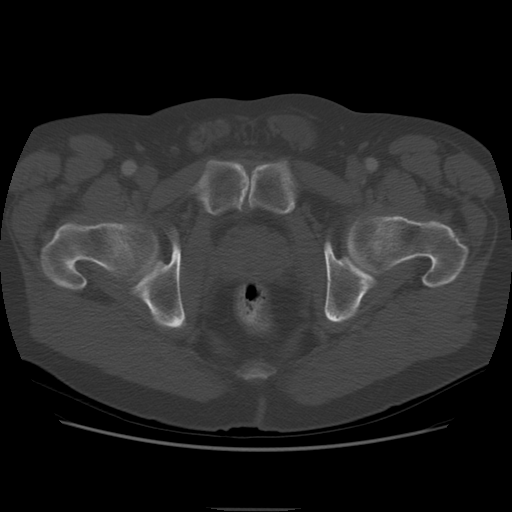
[im 9/81  soft-tissue]
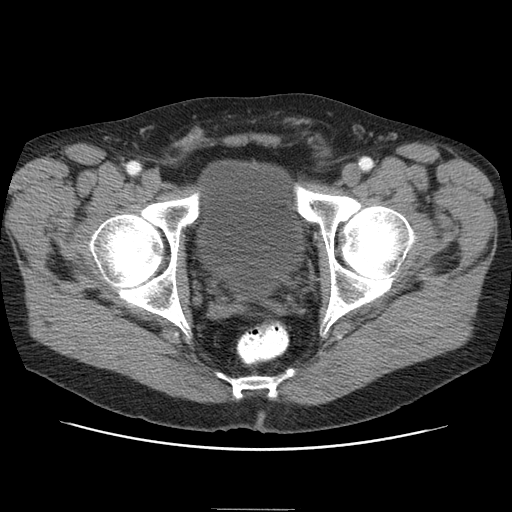
[im 18/81  soft-tissue]
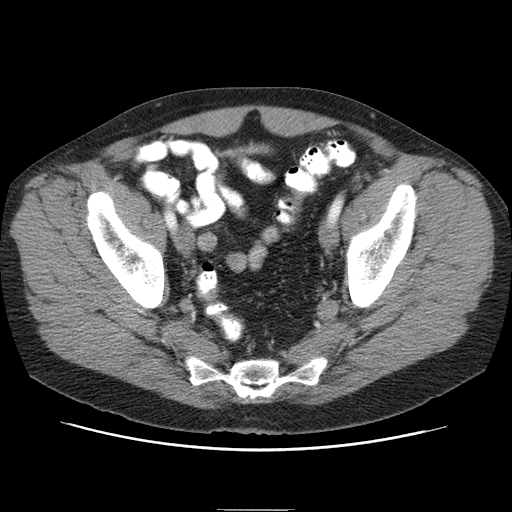
[im 23/81  soft-tissue]
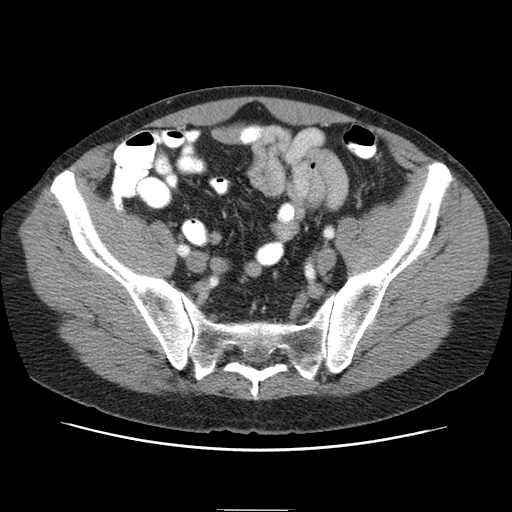
[im 27/81  soft-tissue]
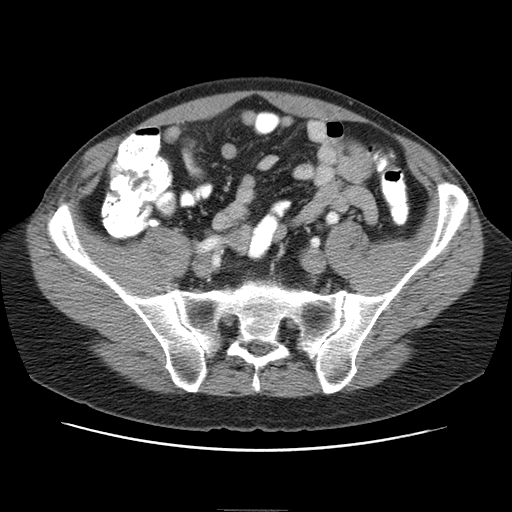
[im 32/81  soft-tissue]
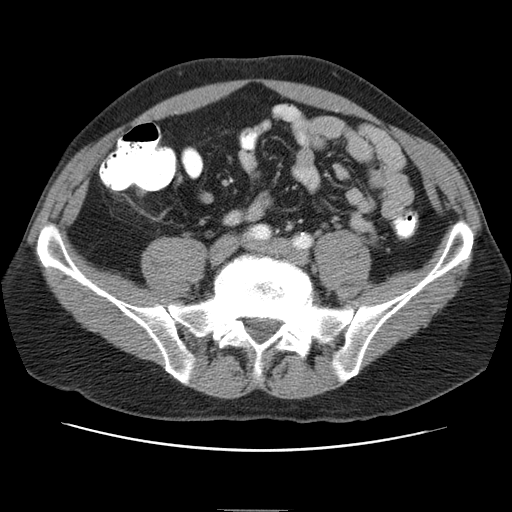
[im 36/81  soft-tissue]
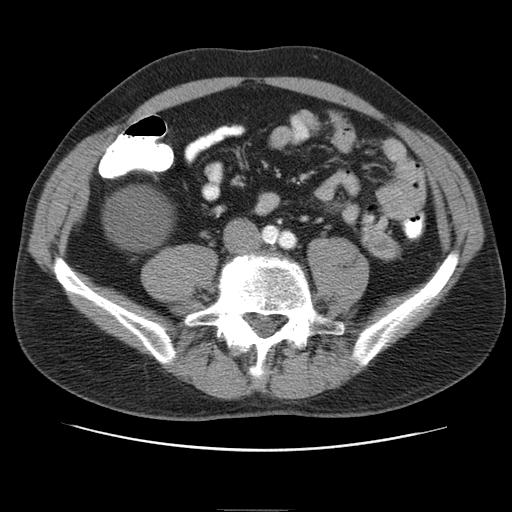
[im 45/81  soft-tissue]
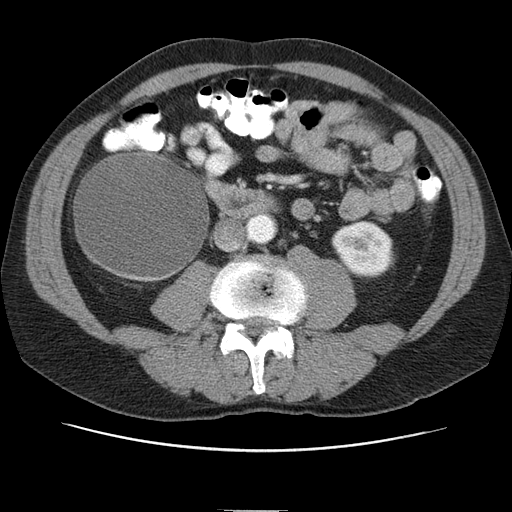
[im 49/81  soft-tissue]
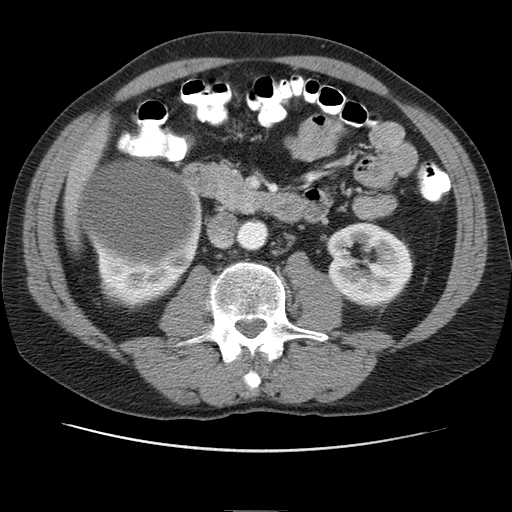
[im 49/81  bone]
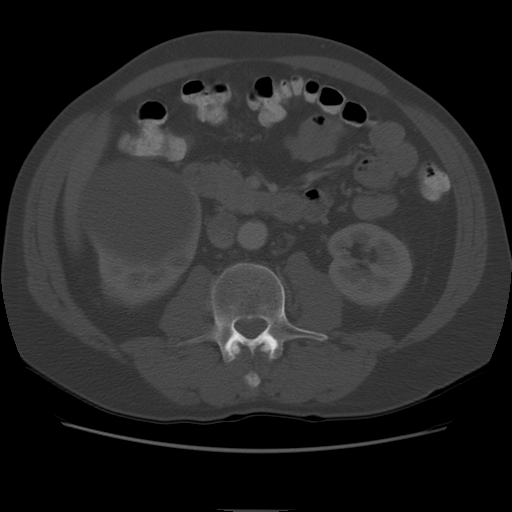
[im 54/81  soft-tissue]
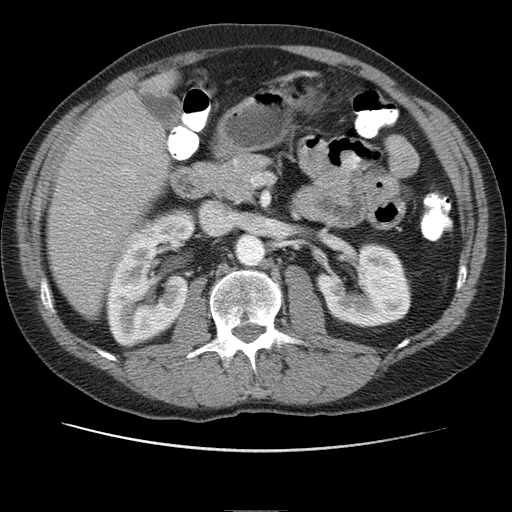
[im 58/81  soft-tissue]
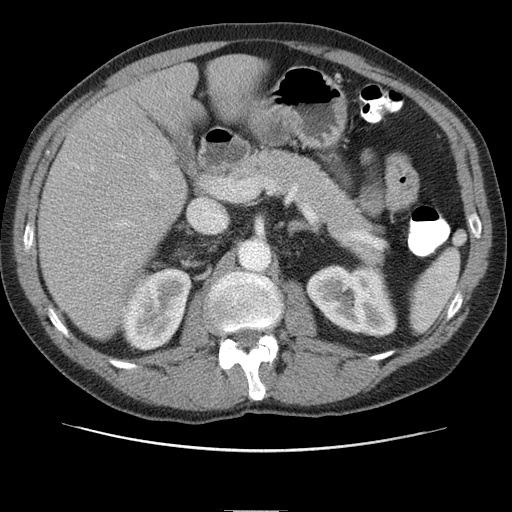
[im 63/81  soft-tissue]
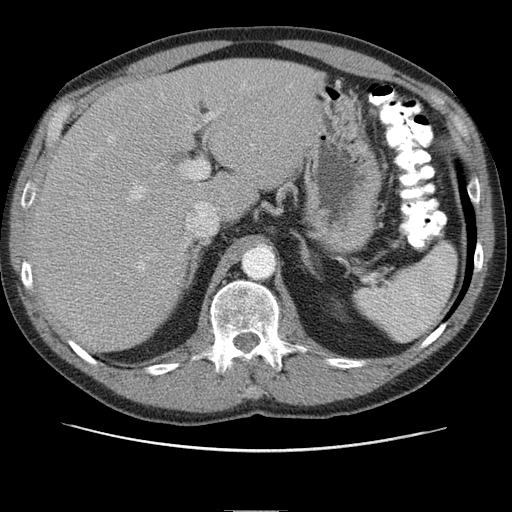
[im 72/81  soft-tissue]
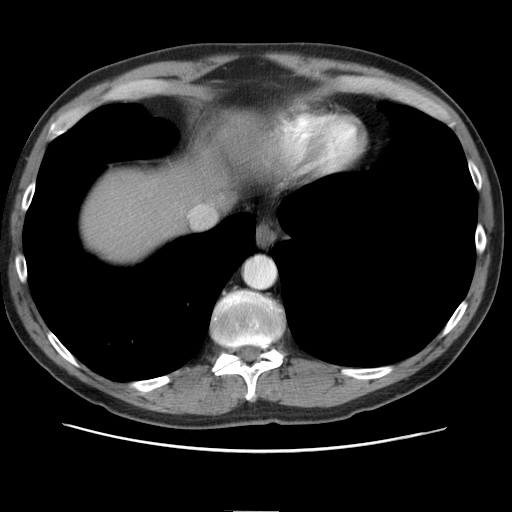
[im 76/81  soft-tissue]
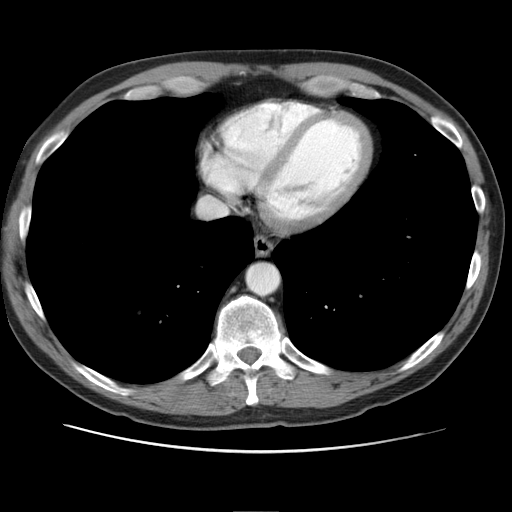

[Series 400: cor · coronal · 0.93mm/px · 3 of 124 slices shown]
[im 42/124  soft-tissue]
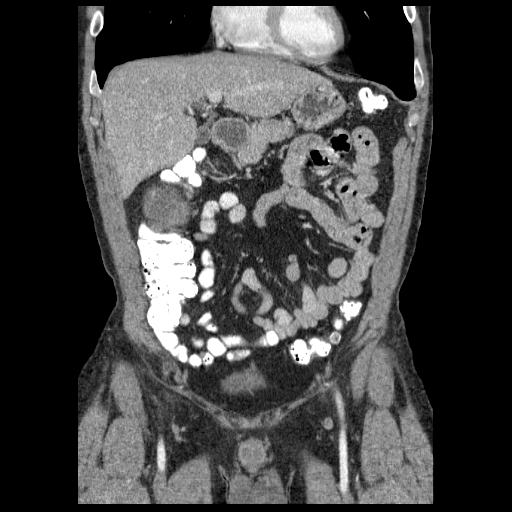
[im 55/124  soft-tissue]
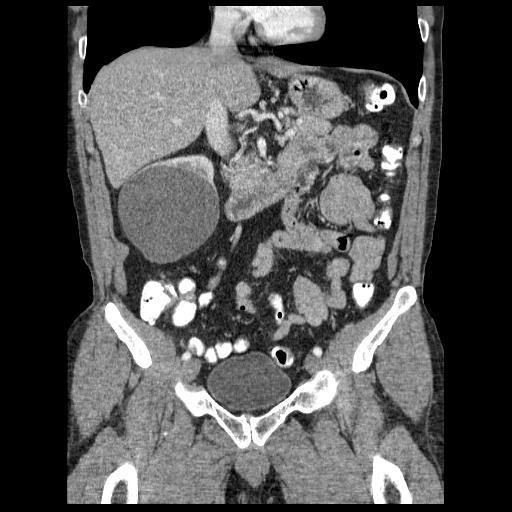
[im 69/124  soft-tissue]
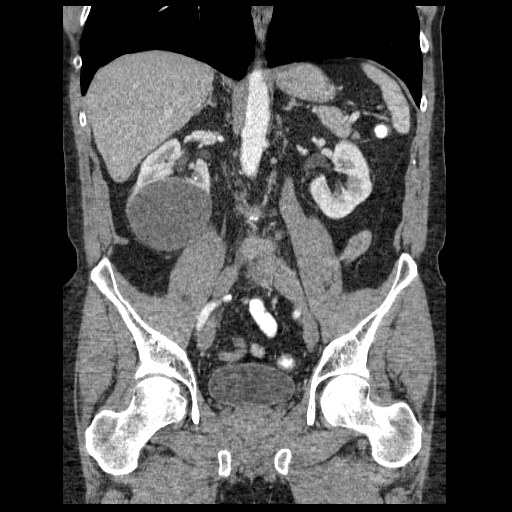

[17 of 46 positions shown; findings below may reference images not displayed]

FINDINGS: The lung bases are clear.  The liver enhances with no
focal abnormality.  No ductal dilatation is seen.  The gallbladder
is unremarkable.  The pancreas is normal in size and the pancreatic
duct is not dilated.  The adrenal glands and spleen are
unremarkable.  The stomach is not well distended.  The kidneys
enhance and again a large cyst is noted in the lower pole of the
right kidney measuring 9.5 x 9.0 cm compared to 8.8 x 8.5 cm
previously.  This cyst appears simple with no complicating
features.  A nonobstructing calculus is again noted within the
right lower pole collecting system.  The abdominal aorta is normal
in caliber with atheromatous change present.  No adenopathy is
seen.

The urinary bladder is unremarkable.  The prostate is normal in
size.  There do appear to be a scrotal hydroceles present on the
images obtained.  Scattered rectosigmoid colonic diverticula are
present.  The appendix and terminal ileum are moderately well seen
and are unremarkable.  No pelvic mass or fluid is seen.
Degenerative disc disease again is noted at L3-4, L4-5, and L5-S1
levels.
IMPRESSION: 1.  No abdominal or pelvic mass or adenopathy.
2.  Little change in simple appearing right lower pole renal cyst.
3.  Nonobstructing right lower pole renal calculus of 4-5 mm in
diameter.
4.  The appendix and terminal ileum appear normal.
5.  Scrotal hydroceles.

## 2011-02-07 MED ORDER — IOHEXOL 300 MG/ML  SOLN
100.0000 mL | Freq: Once | INTRAMUSCULAR | Status: AC | PRN
  Administered 2011-02-07: 100 mL via INTRAVENOUS

## 2011-05-23 ENCOUNTER — Encounter: Payer: Self-pay | Admitting: Internal Medicine

## 2011-05-24 ENCOUNTER — Ambulatory Visit: Payer: BC Managed Care – PPO

## 2011-07-08 ENCOUNTER — Encounter: Payer: Self-pay | Admitting: *Deleted

## 2011-07-08 DIAGNOSIS — J309 Allergic rhinitis, unspecified: Secondary | ICD-10-CM | POA: Insufficient documentation

## 2011-07-08 DIAGNOSIS — I10 Essential (primary) hypertension: Secondary | ICD-10-CM

## 2011-07-08 DIAGNOSIS — F17200 Nicotine dependence, unspecified, uncomplicated: Secondary | ICD-10-CM

## 2011-07-08 DIAGNOSIS — J449 Chronic obstructive pulmonary disease, unspecified: Secondary | ICD-10-CM

## 2011-07-08 DIAGNOSIS — E559 Vitamin D deficiency, unspecified: Secondary | ICD-10-CM | POA: Insufficient documentation

## 2011-07-08 DIAGNOSIS — N200 Calculus of kidney: Secondary | ICD-10-CM

## 2011-07-08 DIAGNOSIS — Z79899 Other long term (current) drug therapy: Secondary | ICD-10-CM

## 2011-07-08 DIAGNOSIS — M1990 Unspecified osteoarthritis, unspecified site: Secondary | ICD-10-CM

## 2011-07-08 DIAGNOSIS — K219 Gastro-esophageal reflux disease without esophagitis: Secondary | ICD-10-CM

## 2011-07-08 DIAGNOSIS — E785 Hyperlipidemia, unspecified: Secondary | ICD-10-CM

## 2011-07-19 ENCOUNTER — Ambulatory Visit (INDEPENDENT_AMBULATORY_CARE_PROVIDER_SITE_OTHER): Payer: BC Managed Care – PPO | Admitting: Emergency Medicine

## 2011-07-19 VITALS — BP 124/80 | HR 84 | Temp 98.3°F | Resp 16 | Ht 72.5 in | Wt 186.0 lb

## 2011-07-19 DIAGNOSIS — M542 Cervicalgia: Secondary | ICD-10-CM

## 2011-07-19 MED ORDER — HYDROCODONE-ACETAMINOPHEN 7.5-750 MG PO TABS: 1.0000 | ORAL_TABLET | Freq: Four times a day (QID) | ORAL | Status: DC | PRN

## 2011-07-19 NOTE — Progress Notes (Signed)
  Subjective:    Patient ID: Douglas [name], male    DOB: Dec 24, 1955, 56 y.o.   MRN: [id number]  HPIPersistent neck and shoulder pain    Review of Systems  Constitutional: Negative.   Musculoskeletal: Positive for back pain and joint swelling.       Objective:   Physical Exam  Musculoskeletal: He exhibits tenderness.       Decrease rom left shoulder.Tender left side of neck          Assessment & Plan:

## 2011-07-19 NOTE — Patient Instructions (Signed)
Discharge Instructions Browse by Alphabet A B C D E F G H I J K L M N O P Q R S T U V W X Y Z  Browse by Category All Documents Allergy and Immunology Anesthesiology [facility] Bioterrorism Cardiology Critical Care Dentistry Dermatology Diabetes Dietary Easy-to-Read Emergency Medicine Endocrinology ENT Family Medicine Forms Gastroenterology Geriatrics Infectious Disease Internal Medicine Labs and Tests Neonatology Nephrology Neurology Obstetrics and Gynecology Oncology Ophthalmology Orthopedics Pediatrics Pharmacology Physical Medicine and Rehabilitation Podiatry Preventative Medicine Procedures Psychiatry Pulmonary Medicine Radiology Rheumatology Surgery Urology Drug Information Sheets All Drug Information Sheets  Browse by Alphabet A B C D E F G H I J K L M N O P Q R S T U V W X Y Z Cholesterol Cholesterol is a white, waxy, fat-like protein needed by your body in small amounts. The liver makes all the cholesterol you need. It is carried from the liver by the blood through the blood vessels. Deposits (plaque) may build up on blood vessel walls. This makes the arteries narrower and stiffer. Plaque increases the risk for heart attack and stroke. You cannot feel your cholesterol level even if it is very high. The only way to know is by a blood test to check your lipid (fats) levels. Once you know your cholesterol levels, you should keep a record of the test results. Work with your caregiver to to keep your levels in the desired range. WHAT THE RESULTS MEAN:  Total cholesterol is a rough measure of all the cholesterol in your blood.     LDL is the so-called bad cholesterol. This is the type that deposits cholesterol in the walls of the arteries. You want this level to be low.     HDL is the good cholesterol because it cleans the arteries and carries the LDL away. You want this level to be high.     Triglycerides are fat that the body can either burn for energy  or store. High levels are closely linked to heart disease.  DESIRED LEVELS:  Total cholesterol below 200.     LDL below 100 for people at risk, below 70 for very high risk.     HDL above 50 is good, above 60 is best.     Triglycerides below 150.  HOW TO LOWER YOUR CHOLESTEROL:  Diet.     Choose fish or white meat chicken and [place], roasted or baked. Limit fatty cuts of red meat, fried foods, and processed meats, such as sausage and lunch meat.     Eat lots of fresh fruits and vegetables. Choose whole grains, beans, pasta, potatoes and cereals.     Use only small amounts of olive, corn or canola oils. Avoid butter, mayonnaise, shortening or palm kernel oils. Avoid foods with trans-fats.     Use skim/nonfat milk and low-fat/nonfat yogurt and cheeses. Avoid whole milk, cream, ice cream, egg yolks and cheeses. Healthy desserts include angel food cake, ginger snaps, animal crackers, hard candy, popsicles, and low-fat/nonfat frozen yogurt. Avoid pastries, cakes, pies and cookies.     Exercise.    A regular program helps decrease LDL and raises HDL.     Helps with weight control.     Do things that increase your activity level like gardening, walking, or taking the stairs.     Medication.    May be prescribed by your caregiver to help lowering cholesterol and the risk for heart disease.     You may need medicine even if your  levels are normal if you have several risk factors.  HOME CARE INSTRUCTIONS    Follow your diet and exercise programs as suggested by your caregiver.     Take medications as directed.     Have blood work done when your caregiver feels it is necessary.  MAKE SURE YOU:    Understand these instructions.     Will watch your condition.     Will get help right away if you are not doing well or get worse.  Document Released: 02/28/2001 Document Revised: 02/15/2011 Document Reviewed: 08/21/2007 [facility] Patient Information 2012 [place],  [place].Cholesterol Cholesterol is a white, waxy, fat-like protein needed by your body in small amounts. The liver makes all the cholesterol you need. It is carried from the liver by the blood through the blood vessels. Deposits (plaque) may build up on blood vessel walls. This makes the arteries narrower and stiffer. Plaque increases the risk for heart attack and stroke. You cannot feel your cholesterol level even if it is very high. The only way to know is by a blood test to check your lipid (fats) levels. Once you know your cholesterol levels, you should keep a record of the test results. Work with your caregiver to to keep your levels in the desired range. WHAT THE RESULTS MEAN:  Total cholesterol is a rough measure of all the cholesterol in your blood.     LDL is the so-called bad cholesterol. This is the type that deposits cholesterol in the walls of the arteries. You want this level to be low.     HDL is the good cholesterol because it cleans the arteries and carries the LDL away. You want this level to be high.     Triglycerides are fat that the body can either burn for energy or store. High levels are closely linked to heart disease.  DESIRED LEVELS:  Total cholesterol below 200.     LDL below 100 for people at risk, below 70 for very high risk.     HDL above 50 is good, above 60 is best.     Triglycerides below 150.  HOW TO LOWER YOUR CHOLESTEROL:  Diet.     Choose fish or white meat chicken and [place], roasted or baked. Limit fatty cuts of red meat, fried foods, and processed meats, such as sausage and lunch meat.     Eat lots of fresh fruits and vegetables. Choose whole grains, beans, pasta, potatoes and cereals.     Use only small amounts of olive, corn or canola oils. Avoid butter, mayonnaise, shortening or palm kernel oils. Avoid foods with trans-fats.     Use skim/nonfat milk and low-fat/nonfat yogurt and cheeses. Avoid whole milk, cream, ice cream, egg yolks and cheeses.  Healthy desserts include angel food cake, ginger snaps, animal crackers, hard candy, popsicles, and low-fat/nonfat frozen yogurt. Avoid pastries, cakes, pies and cookies.     Exercise.    A regular program helps decrease LDL and raises HDL.     Helps with weight control.     Do things that increase your activity level like gardening, walking, or taking the stairs.     Medication.    May be prescribed by your caregiver to help lowering cholesterol and the risk for heart disease.     You may need medicine even if your levels are normal if you have several risk factors.  HOME CARE INSTRUCTIONS    Follow your diet and exercise programs as suggested by your caregiver.  Take medications as directed.     Have blood work done when your caregiver feels it is necessary.  MAKE SURE YOU:    Understand these instructions.     Will watch your condition.     Will get help right away if you are not doing well or get worse.  Document Released: 02/28/2001 Document Revised: 02/15/2011 Document Reviewed: 08/21/2007 [facility] Patient Information 2012 [place], [place].Cholesterol Cholesterol is a white, waxy, fat-like protein needed by your body in small amounts. The liver makes all the cholesterol you need. It is carried from the liver by the blood through the blood vessels. Deposits (plaque) may build up on blood vessel walls. This makes the arteries narrower and stiffer. Plaque increases the risk for heart attack and stroke. You cannot feel your cholesterol level even if it is very high. The only way to know is by a blood test to check your lipid (fats) levels. Once you know your cholesterol levels, you should keep a record of the test results. Work with your caregiver to to keep your levels in the desired range. WHAT THE RESULTS MEAN:  Total cholesterol is a rough measure of all the cholesterol in your blood.     LDL is the so-called bad cholesterol. This is the type that deposits cholesterol in  the walls of the arteries. You want this level to be low.     HDL is the good cholesterol because it cleans the arteries and carries the LDL away. You want this level to be high.     Triglycerides are fat that the body can either burn for energy or store. High levels are closely linked to heart disease.  DESIRED LEVELS:  Total cholesterol below 200.     LDL below 100 for people at risk, below 70 for very high risk.     HDL above 50 is good, above 60 is best.     Triglycerides below 150.  HOW TO LOWER YOUR CHOLESTEROL:  Diet.     Choose fish or white meat chicken and [place], roasted or baked. Limit fatty cuts of red meat, fried foods, and processed meats, such as sausage and lunch meat.     Eat lots of fresh fruits and vegetables. Choose whole grains, beans, pasta, potatoes and cereals.     Use only small amounts of olive, corn or canola oils. Avoid butter, mayonnaise, shortening or palm kernel oils. Avoid foods with trans-fats.     Use skim/nonfat milk and low-fat/nonfat yogurt and cheeses. Avoid whole milk, cream, ice cream, egg yolks and cheeses. Healthy desserts include angel food cake, ginger snaps, animal crackers, hard candy, popsicles, and low-fat/nonfat frozen yogurt. Avoid pastries, cakes, pies and cookies.     Exercise.    A regular program helps decrease LDL and raises HDL.     Helps with weight control.     Do things that increase your activity level like gardening, walking, or taking the stairs.     Medication.    May be prescribed by your caregiver to help lowering cholesterol and the risk for heart disease.     You may need medicine even if your levels are normal if you have several risk factors.  HOME CARE INSTRUCTIONS    Follow your diet and exercise programs as suggested by your caregiver.     Take medications as directed.     Have blood work done when your caregiver feels it is necessary.  MAKE SURE YOU:    Understand these instructions.  Will  watch your condition.     Will get help right away if you are not doing well or get worse.  Document Released: 02/28/2001 Document Revised: 02/15/2011 Document Reviewed: 08/21/2007 [facility] Patient Information 2012 [place], [place].

## 2011-08-08 ENCOUNTER — Ambulatory Visit: Payer: Self-pay | Admitting: Emergency Medicine

## 2011-08-21 ENCOUNTER — Encounter: Payer: Self-pay | Admitting: Internal Medicine

## 2011-09-13 ENCOUNTER — Other Ambulatory Visit: Payer: Self-pay

## 2011-09-13 MED ORDER — ATORVASTATIN CALCIUM 10 MG PO TABS: 10.0000 mg | ORAL_TABLET | Freq: Every day | ORAL | Status: DC

## 2011-09-13 MED ORDER — LOSARTAN POTASSIUM 50 MG PO TABS: 50.0000 mg | ORAL_TABLET | Freq: Every day | ORAL | Status: DC

## 2011-09-13 MED ORDER — FLUTICASONE PROPIONATE 50 MCG/ACT NA SUSP: 2.0000 | Freq: Every day | NASAL | Status: DC

## 2011-09-25 ENCOUNTER — Ambulatory Visit (AMBULATORY_SURGERY_CENTER): Payer: BC Managed Care – PPO | Admitting: *Deleted

## 2011-09-25 VITALS — Ht 74.0 in | Wt 190.0 lb

## 2011-09-25 DIAGNOSIS — Z1211 Encounter for screening for malignant neoplasm of colon: Secondary | ICD-10-CM

## 2011-09-25 DIAGNOSIS — Z86010 Personal history of colonic polyps: Secondary | ICD-10-CM

## 2011-09-25 MED ORDER — PEG-KCL-NACL-NASULF-NA ASC-C 100 G PO SOLR: ORAL | Status: DC

## 2011-10-09 ENCOUNTER — Ambulatory Visit (AMBULATORY_SURGERY_CENTER): Payer: BC Managed Care – PPO | Admitting: Internal Medicine

## 2011-10-09 ENCOUNTER — Encounter: Payer: Self-pay | Admitting: Internal Medicine

## 2011-10-09 VITALS — BP 127/84 | HR 67 | Temp 98.6°F | Resp 18 | Ht 74.0 in | Wt 190.0 lb

## 2011-10-09 DIAGNOSIS — D126 Benign neoplasm of colon, unspecified: Secondary | ICD-10-CM

## 2011-10-09 DIAGNOSIS — Z86010 Personal history of colonic polyps: Secondary | ICD-10-CM

## 2011-10-09 DIAGNOSIS — Z1211 Encounter for screening for malignant neoplasm of colon: Secondary | ICD-10-CM

## 2011-10-09 DIAGNOSIS — K635 Polyp of colon: Secondary | ICD-10-CM

## 2011-10-09 MED ORDER — SODIUM CHLORIDE 0.9 % IV SOLN: 500.0000 mL | INTRAVENOUS | Status: DC

## 2011-10-09 NOTE — Op Note (Signed)
[facility] [address] [place], [place]  [address]  COLONOSCOPY PROCEDURE REPORT  PATIENT:  Douglas [name], Douglas [name]  MR#:  [id number] BIRTHDATE:  [birth year], 55 yrs. old  GENDER:  male ENDOSCOPIST:  Hedwig Morton. Juanda Chance, MD REF. BY:  Lesle Chris, M.D. PROCEDURE DATE:  10/09/2011 PROCEDURE:  Colonoscopy with biopsy and snare polypectomy ASA CLASS:  Class I INDICATIONS:  history of pre-cancerous (adenomatous) colon polyps 2007 colon- 2 adenomatous polyps MEDICATIONS:   MAC sedation, administered by CRNA, propofol (Diprivan) 400 mg  DESCRIPTION OF PROCEDURE:   After the risks and benefits and of the procedure were explained, informed consent was obtained. Digital rectal exam was performed and revealed no rectal masses. The LB [id number]-[id number] endoscope was introduced through the anus and advanced to the cecum, which was identified by both the appendix and ileocecal valve.  The quality of the prep was good, using MoviPrep.  The instrument was then slowly withdrawn as the colon was fully examined. <<PROCEDUREIMAGES>>  FINDINGS:  There were multiple polyps identified and removed. throughout the colon. 3 diminutive 2 mm polyps in rectosigmoid at 15 cm, 1 sessile 6 mm polyp at 140 cm, 1 5 mm sessile polyp at 100c The polyps were removed using cold biopsy forceps. Polyps were snared without cautery. Retrieval was successful (see image1, image2, image4, image9, and image8). snare polyp  This was otherwise a normal examination of the colon (see image11, image6, image7, and image5).   Retroflexed views in the rectum revealed no abnormalities.    The scope was then withdrawn from the patient and the procedure completed.  COMPLICATIONS:  None ENDOSCOPIC IMPRESSION: 1) Polyps, multiple throughout the colon 2) Otherwise normal examination RECOMMENDATIONS: 1) Await pathology results 2) High fiber diet.  REPEAT EXAM:  In 5 year(s) for.  ______________________________ Hedwig Morton. Juanda Chance,  MD  CC:  n. eSIGNED:   Hedwig Morton. Kahne Helfand at 10/09/2011 10:29 AM  [name], [phone]

## 2011-10-09 NOTE — Progress Notes (Signed)
Patient did not experience any of the following events: a burn prior to discharge; a fall within the facility; wrong site/side/patient/procedure/implant event; or a hospital transfer or hospital admission upon discharge from the facility. (G8907) Patient did not have preoperative order for IV antibiotic SSI prophylaxis. (G8918)  

## 2011-10-09 NOTE — Patient Instructions (Signed)

## 2011-10-10 ENCOUNTER — Telehealth: Payer: Self-pay | Admitting: *Deleted

## 2011-10-12 ENCOUNTER — Encounter: Payer: Self-pay | Admitting: Internal Medicine

## 2011-10-16 ENCOUNTER — Other Ambulatory Visit: Payer: Self-pay | Admitting: Dermatology

## 2011-10-18 NOTE — Telephone Encounter (Signed)
  Follow up Call-  Call back number 10/09/2011  Post procedure Call Back phone  # cell [phone]  Permission to leave phone message Yes     Patient questions:  Do you have a fever, pain , or abdominal swelling? no Pain Score  0 *  Have you tolerated food without any problems? yes  Have you been able to return to your normal activities? yes  Do you have any questions about your discharge instructions: Diet   no Medications  no Follow up visit  no  Do you have questions or concerns about your Care? no  Actions: * If pain score is 4 or above: No action needed, pain <4.

## 2011-12-17 ENCOUNTER — Ambulatory Visit (INDEPENDENT_AMBULATORY_CARE_PROVIDER_SITE_OTHER): Payer: BC Managed Care – PPO | Admitting: Emergency Medicine

## 2011-12-17 VITALS — BP 119/80 | HR 76 | Temp 98.1°F | Resp 16 | Ht 73.0 in | Wt 186.2 lb

## 2011-12-17 DIAGNOSIS — E559 Vitamin D deficiency, unspecified: Secondary | ICD-10-CM

## 2011-12-17 DIAGNOSIS — N281 Cyst of kidney, acquired: Secondary | ICD-10-CM

## 2011-12-17 DIAGNOSIS — J449 Chronic obstructive pulmonary disease, unspecified: Secondary | ICD-10-CM

## 2011-12-17 DIAGNOSIS — M542 Cervicalgia: Secondary | ICD-10-CM

## 2011-12-17 LAB — COMPREHENSIVE METABOLIC PANEL
ALT: 26 U/L (ref 0–53)
CO2: 24 mEq/L (ref 19–32)
Calcium: 10 mg/dL (ref 8.4–10.5)
Chloride: 106 mEq/L (ref 96–112)
Creat: 0.86 mg/dL (ref 0.50–1.35)
Total Protein: 6.8 g/dL (ref 6.0–8.3)

## 2011-12-17 LAB — POCT CBC
HCT, POC: 51.7 % (ref 43.5–53.7)
Hemoglobin: 16.7 g/dL (ref 14.1–18.1)
MCH, POC: 32.1 pg — AB (ref 27–31.2)
MCV: 99.2 fL — AB (ref 80–97)
MID (cbc): 0.3 (ref 0–0.9)
RBC: 5.21 M/uL (ref 4.69–6.13)
WBC: 6 10*3/uL (ref 4.6–10.2)

## 2011-12-17 MED ORDER — HYDROCODONE-ACETAMINOPHEN 7.5-750 MG PO TABS: 1.0000 | ORAL_TABLET | Freq: Four times a day (QID) | ORAL | Status: DC | PRN

## 2011-12-17 NOTE — Progress Notes (Signed)
  Subjective:    Patient ID: Douglas [name], male    DOB: May 13, 1956, 56 y.o.   MRN: [id number]  HPI Shoulder worsening.  Shooting pain radiating down shoulder.  Passed kidney stone. Smoking 1/2 pack cigarette daily.  Using gum to quit smoking. Will try electronic cigarette.    Review of Systems     Objective:   Physical Exam  Constitutional: He appears well-developed and well-nourished.  HENT:  Head: Normocephalic.  Eyes: Pupils are equal, round, and reactive to light.  Neck:       Tender over c spine. Decreased ROM.  Cardiovascular: Normal rate and regular rhythm.   Pulmonary/Chest: Effort normal and breath sounds normal.  Abdominal: Soft. Bowel sounds are normal. There is no tenderness. There is no rebound.          Assessment & Plan:

## 2011-12-17 NOTE — Progress Notes (Signed)
  Subjective:    Patient ID: Douglas [name], male    DOB: 1956-06-19, 56 y.o.   MRN: [id number]  HPI patient moves his neck yesterday and tweaked his neck. He had significant pain down his left arm to his also had significant discomfort in his left shoulder and his shoulder seems to slip when he tries to abduct it. He has been to see Dr. Dion Saucier who has recommended surgery but he wants to hold off on this.    Review of Systems     Objective:   Physical Exam the neck is tender over the left posterior cervical area. When the patient abducts his left arm he can only get to about 90 and feels a popping sensation. Chest is clear heart regular rate no murmur        Assessment & Plan:  We'll check a CBC and Cmet today. Ultrasound of the abdomen is ordered to followup a renal cyst.  he has a history of abnormal skin biopsies that put him at risk for developing renal cell carcinoma. He had a CT of the abdomen last year which did not reveal any signs of a renal cell cancer. His regular physical is scheduled for September. We'll get the ultrasound done prior to his visit. Hydrocodone was refilled this as it has been taking a half in the morning and one at night .

## 2011-12-18 ENCOUNTER — Encounter: Payer: Self-pay | Admitting: *Deleted

## 2012-02-05 ENCOUNTER — Ambulatory Visit
Admission: RE | Admit: 2012-02-05 | Discharge: 2012-02-05 | Disposition: A | Discharge status: Home / Self Care | Payer: BC Managed Care – PPO | Source: Ambulatory Visit | Attending: Emergency Medicine | Admitting: Emergency Medicine

## 2012-02-05 DIAGNOSIS — N281 Cyst of kidney, acquired: Secondary | ICD-10-CM

## 2012-02-05 IMAGING — US US ABDOMEN COMPLETE
1 series · 13 of 25 positions shown · non-contrast
Comparison: CT abdomen pelvis of [DATE]

CLINICAL DATA: Follow up of renal cysts, genetic tendency to
develop renal cell carcinoma, leiomyosarcoma, and melanoma

COMPLETE ABDOMINAL ULTRASOUND

[Series 1: us abdomen complete · 0.20mm/px · 13 of 89 slices shown]
[im 1/89]
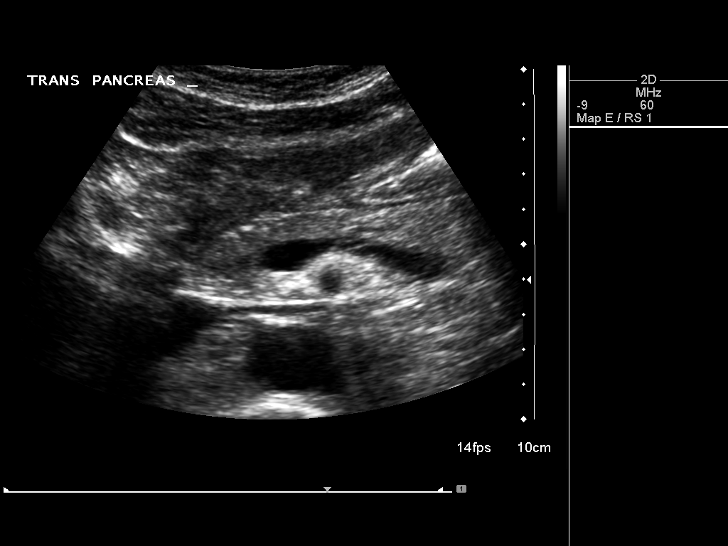
[im 8/89]
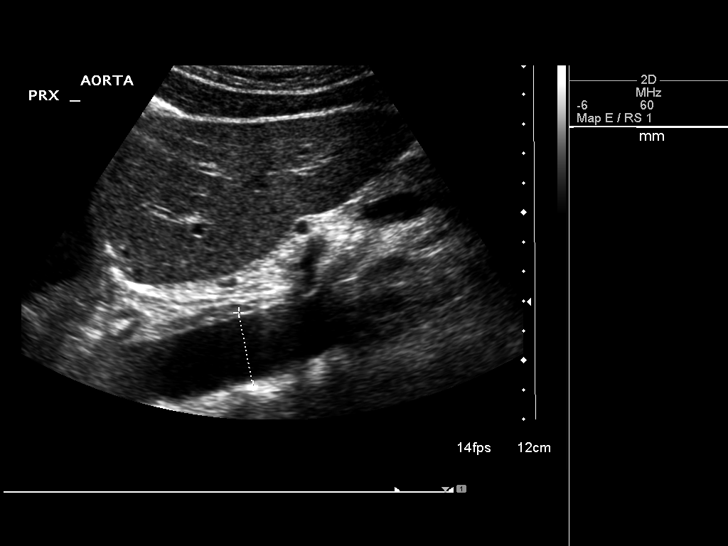
[im 15/89]
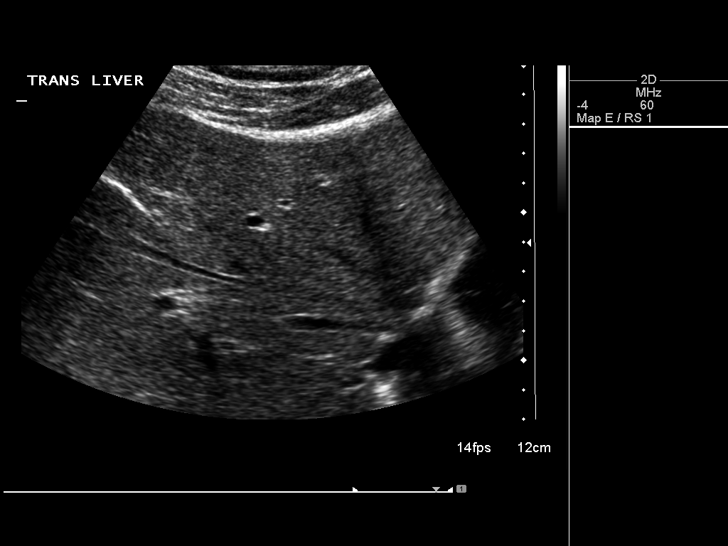
[im 23/89]
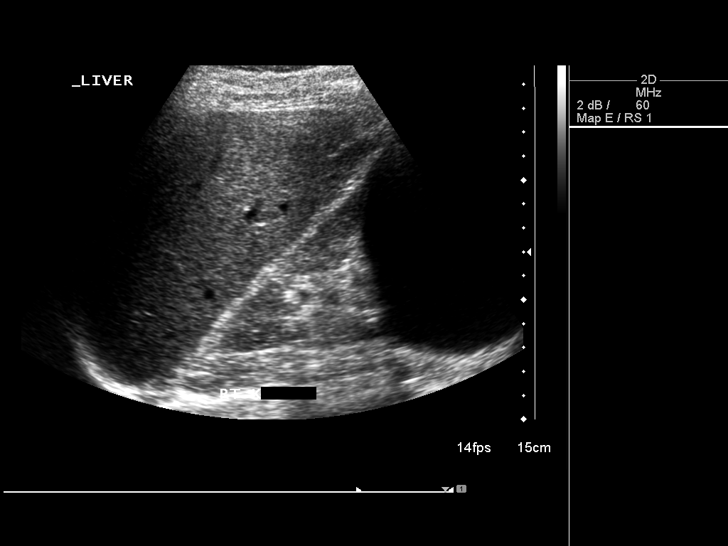
[im 30/89]
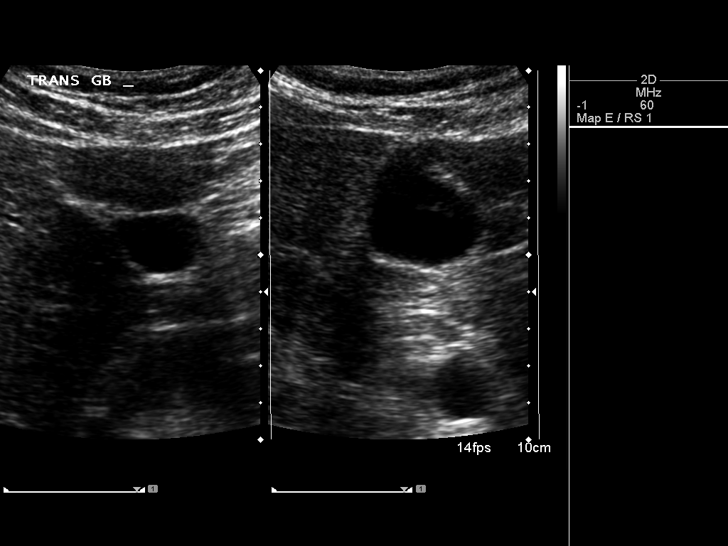
[im 37/89]
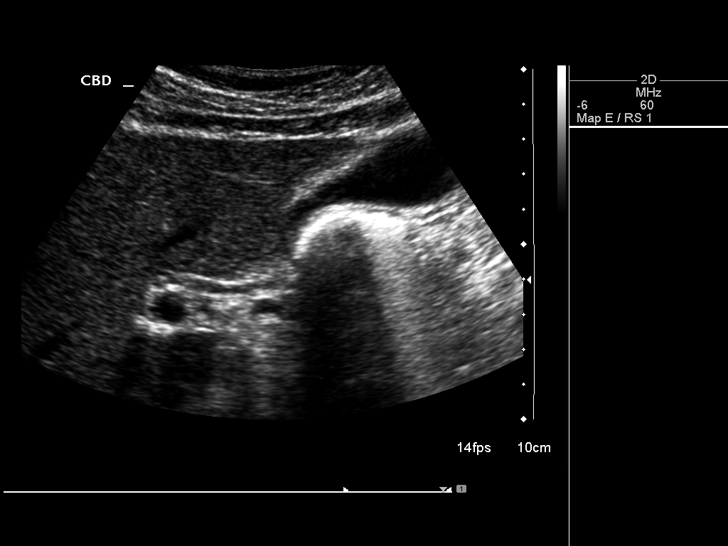
[im 45/89]
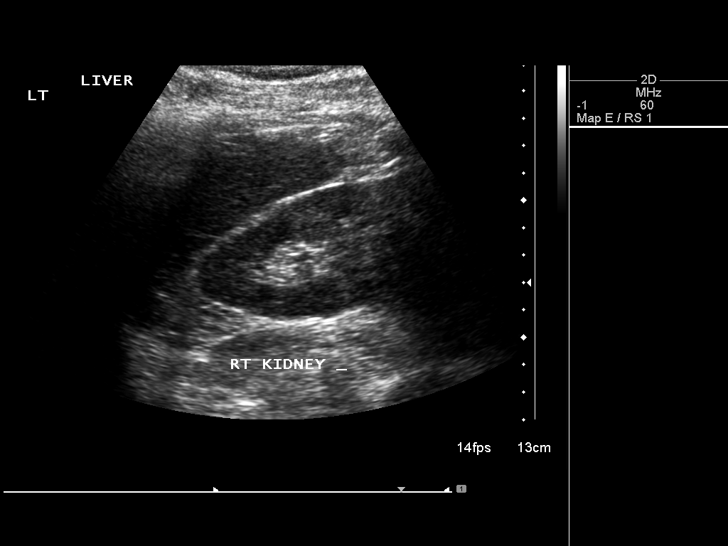
[im 52/89]
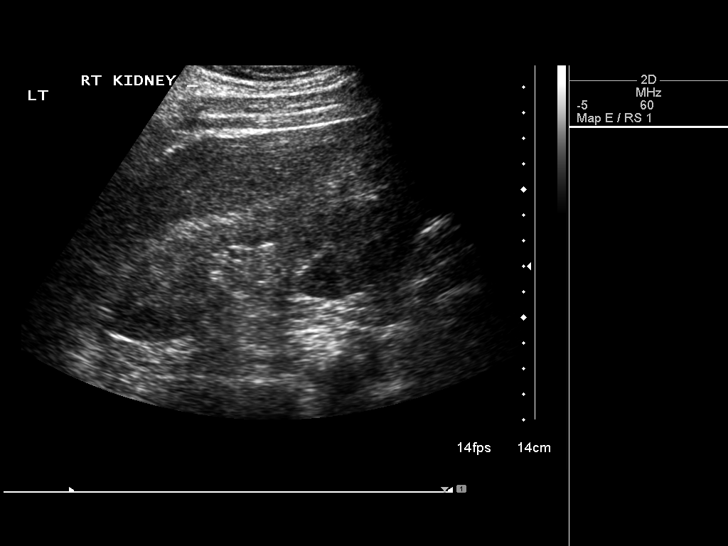
[im 59/89]
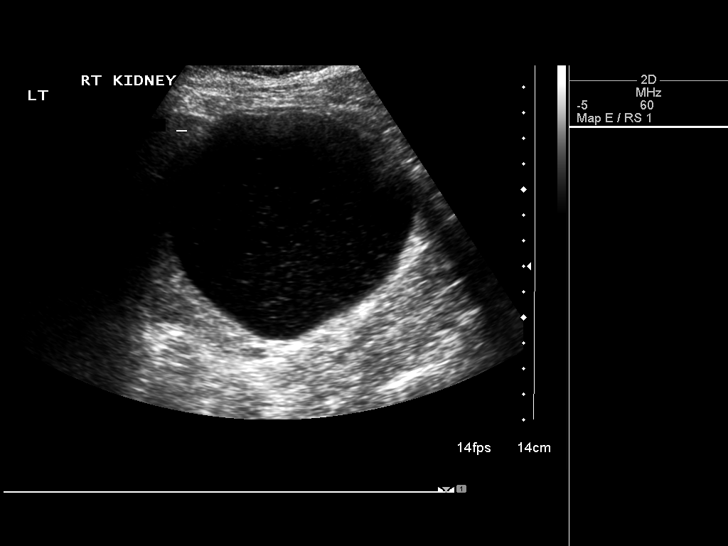
[im 67/89]
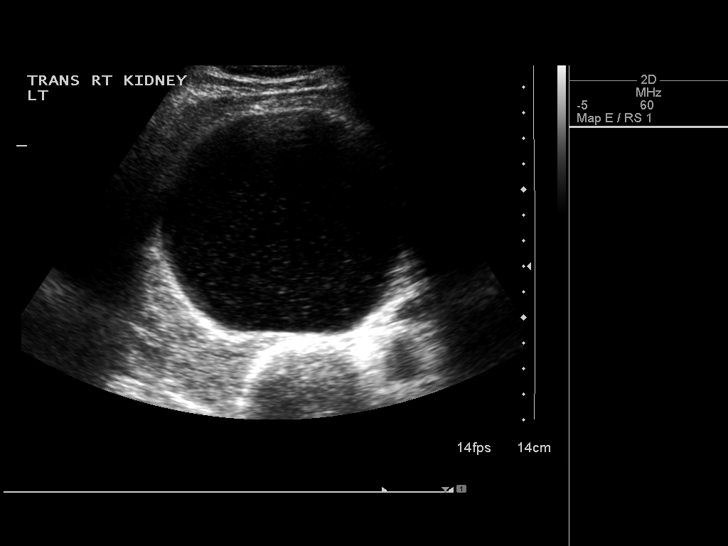
[im 74/89]
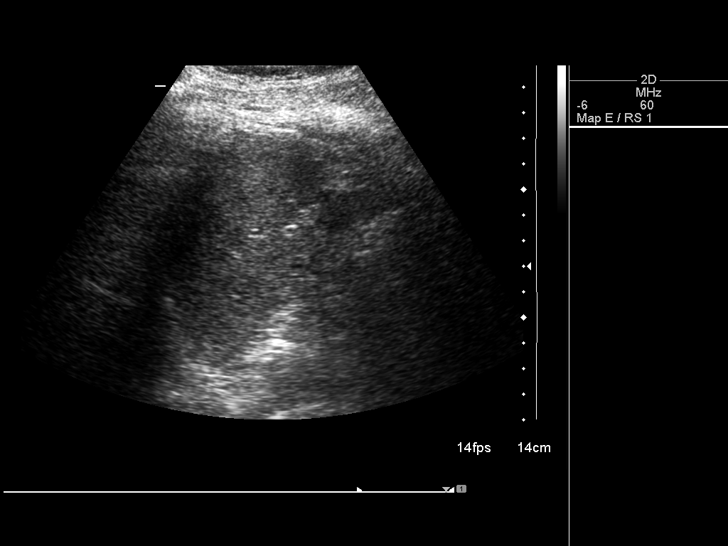
[im 81/89]
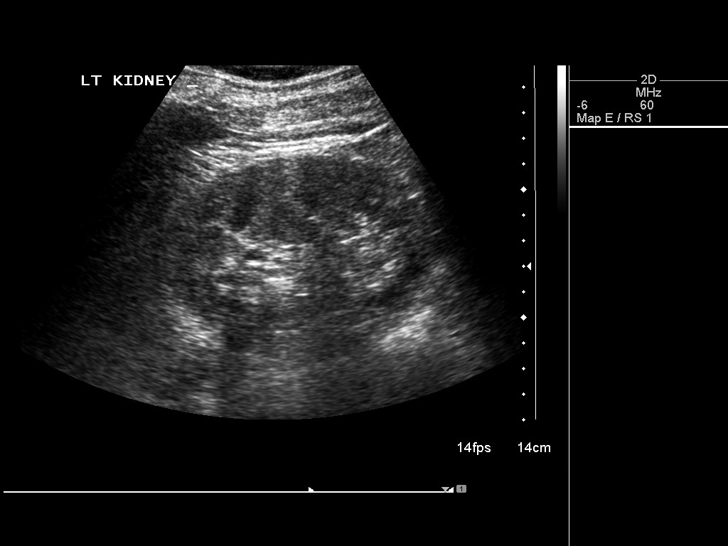
[im 89/89]
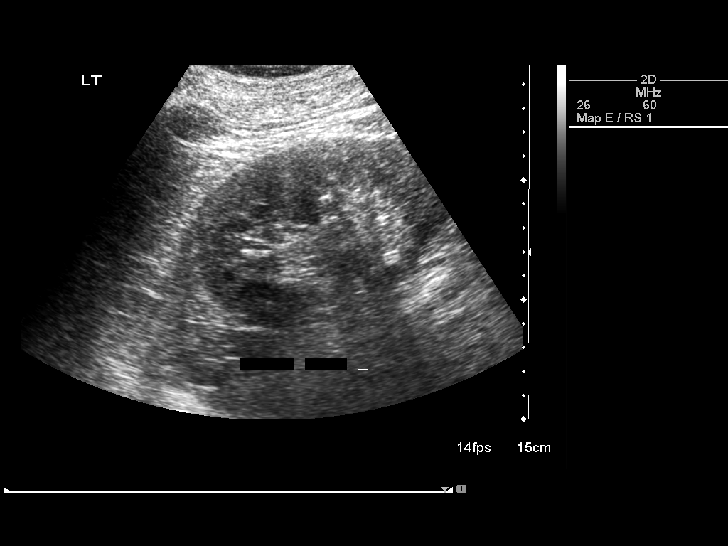

[13 of 25 positions shown; findings below may reference images not displayed]

FINDINGS: Gallbladder:  The gallbladder is visualized and no gallstones are
noted.  There is no pain over gallbladder with compression.

Common bile duct:  The common bile duct is normal measuring 3.0 mm
in diameter.

Liver:  The liver has a normal echogenic pattern.  No ductal
dilatation is seen.

IVC:  Appears normal.

Pancreas:  No focal abnormality seen.

Spleen:  The spleen is normal measuring 6.2 cm sagittally.

Right Kidney:  No hydronephrosis is seen.  However, there is a
large somewhat complex cyst emanating from the lower pole measuring
10.0 x 9.5 x 9.9 cm.  There is a soft tissue mural nodule within
this lesion and a cystic neoplasm cannot be excluded. In view of
the mural component, Urology consultation may be warranted.
Inclusive of the cyst the right kidney measures 16.6 cm sagittally,
12.2 cm not included to assess.

Left Kidney:  No hydronephrosis is seen.  The left kidney measures
12.6 cm sagittally.

Abdominal aorta:  The abdominal aorta is normal in caliber.
IMPRESSION: Complex cyst in the lower pole of the right kidney of 10.0 x 9.5 x
9.9 cm with a soft tissue mural nodule.  A cystic renal neoplasm
cannot be excluded.  Urology consultation is recommended.

## 2012-02-06 ENCOUNTER — Other Ambulatory Visit: Payer: Self-pay | Admitting: Emergency Medicine

## 2012-02-06 DIAGNOSIS — N281 Cyst of kidney, acquired: Secondary | ICD-10-CM

## 2012-02-20 ENCOUNTER — Ambulatory Visit: Payer: BC Managed Care – PPO

## 2012-02-20 ENCOUNTER — Ambulatory Visit (INDEPENDENT_AMBULATORY_CARE_PROVIDER_SITE_OTHER): Payer: BC Managed Care – PPO | Admitting: Emergency Medicine

## 2012-02-20 VITALS — BP 122/82 | HR 72 | Temp 98.2°F | Resp 16 | Ht 73.0 in | Wt 185.8 lb

## 2012-02-20 DIAGNOSIS — M542 Cervicalgia: Secondary | ICD-10-CM

## 2012-02-20 DIAGNOSIS — D4100 Neoplasm of uncertain behavior of unspecified kidney: Secondary | ICD-10-CM

## 2012-02-20 DIAGNOSIS — E782 Mixed hyperlipidemia: Secondary | ICD-10-CM

## 2012-02-20 DIAGNOSIS — Z0000 Encounter for general adult medical examination without abnormal findings: Secondary | ICD-10-CM

## 2012-02-20 DIAGNOSIS — N2889 Other specified disorders of kidney and ureter: Secondary | ICD-10-CM

## 2012-02-20 DIAGNOSIS — B354 Tinea corporis: Secondary | ICD-10-CM

## 2012-02-20 DIAGNOSIS — F17200 Nicotine dependence, unspecified, uncomplicated: Secondary | ICD-10-CM

## 2012-02-20 LAB — CBC WITH DIFFERENTIAL/PLATELET
Basophils Absolute: 0 10*3/uL (ref 0.0–0.1)
Eosinophils Absolute: 0.2 10*3/uL (ref 0.0–0.7)
Eosinophils Relative: 3 % (ref 0–5)
Lymphs Abs: 2 10*3/uL (ref 0.7–4.0)
MCH: 32.2 pg (ref 26.0–34.0)
Neutrophils Relative %: 59 % (ref 43–77)
Platelets: 313 10*3/uL (ref 150–400)
RBC: 5.13 MIL/uL (ref 4.22–5.81)
RDW: 13 % (ref 11.5–15.5)
WBC: 6.4 10*3/uL (ref 4.0–10.5)

## 2012-02-20 LAB — POCT URINALYSIS DIPSTICK
Bilirubin, UA: NEGATIVE
Glucose, UA: NEGATIVE
Leukocytes, UA: NEGATIVE
Nitrite, UA: NEGATIVE
Urobilinogen, UA: 0.2

## 2012-02-20 LAB — COMPREHENSIVE METABOLIC PANEL
ALT: 26 U/L (ref 0–53)
AST: 18 U/L (ref 0–37)
Alkaline Phosphatase: 37 U/L — ABNORMAL LOW (ref 39–117)
CO2: 21 mEq/L (ref 19–32)
Creat: 0.9 mg/dL (ref 0.50–1.35)
Sodium: 138 mEq/L (ref 135–145)
Total Bilirubin: 0.7 mg/dL (ref 0.3–1.2)
Total Protein: 6.6 g/dL (ref 6.0–8.3)

## 2012-02-20 LAB — POCT UA - MICROSCOPIC ONLY
Casts, Ur, LPF, POC: NEGATIVE
Mucus, UA: NEGATIVE
WBC, Ur, HPF, POC: NEGATIVE
Yeast, UA: NEGATIVE

## 2012-02-20 LAB — LIPID PANEL
HDL: 58 mg/dL (ref >39)
LDL Cholesterol: 96 mg/dL (ref 0–99)
Total CHOL/HDL Ratio: 3 Ratio
VLDL: 22 mg/dL (ref 0–40)

## 2012-02-20 LAB — PSA: PSA: 0.95 ng/mL (ref <=4.00)

## 2012-02-20 LAB — TSH: TSH: 1.393 u[IU]/mL (ref 0.350–4.500)

## 2012-02-20 LAB — POCT SKIN KOH: Skin KOH, POC: NEGATIVE

## 2012-02-20 IMAGING — CR DG CHEST 2V
2 series · 2 of 2 positions shown · non-contrast
Comparison: None.

CLINICAL DATA: Smoker.  Physical exam.

CHEST - 2 VIEW

[PA]
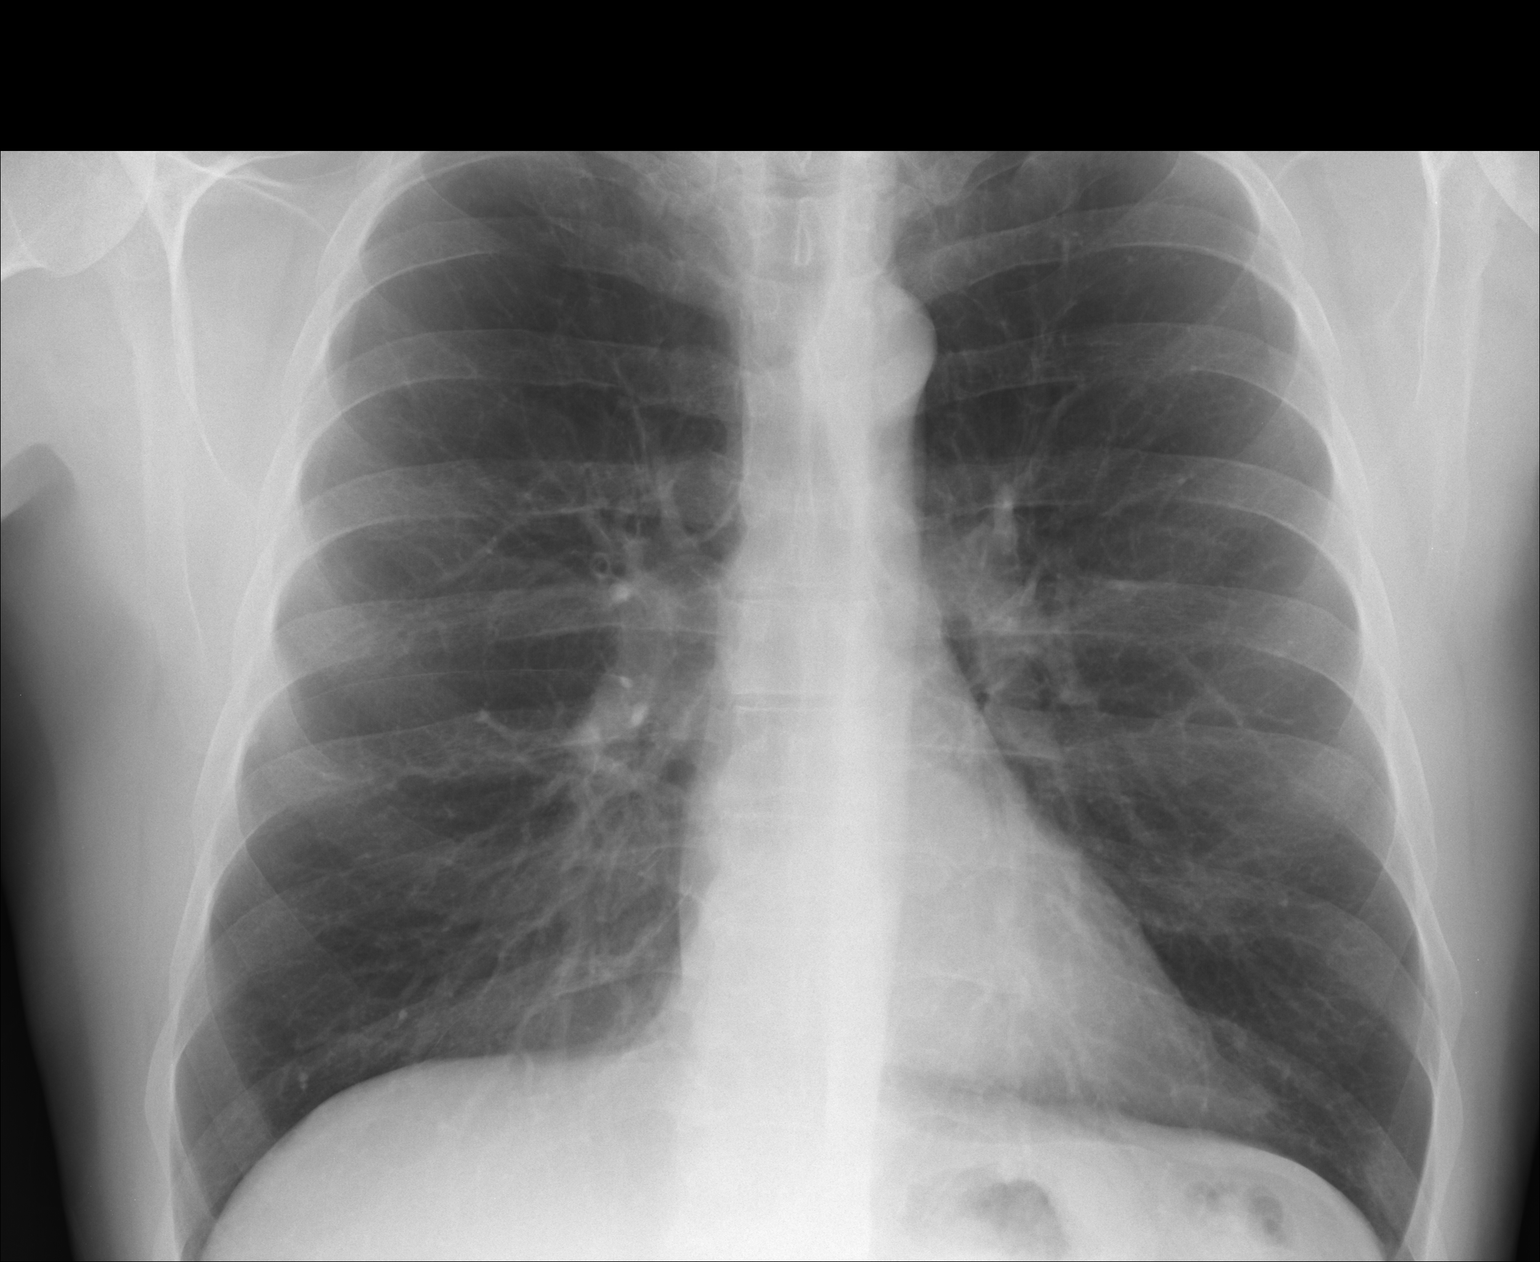

[lateral]
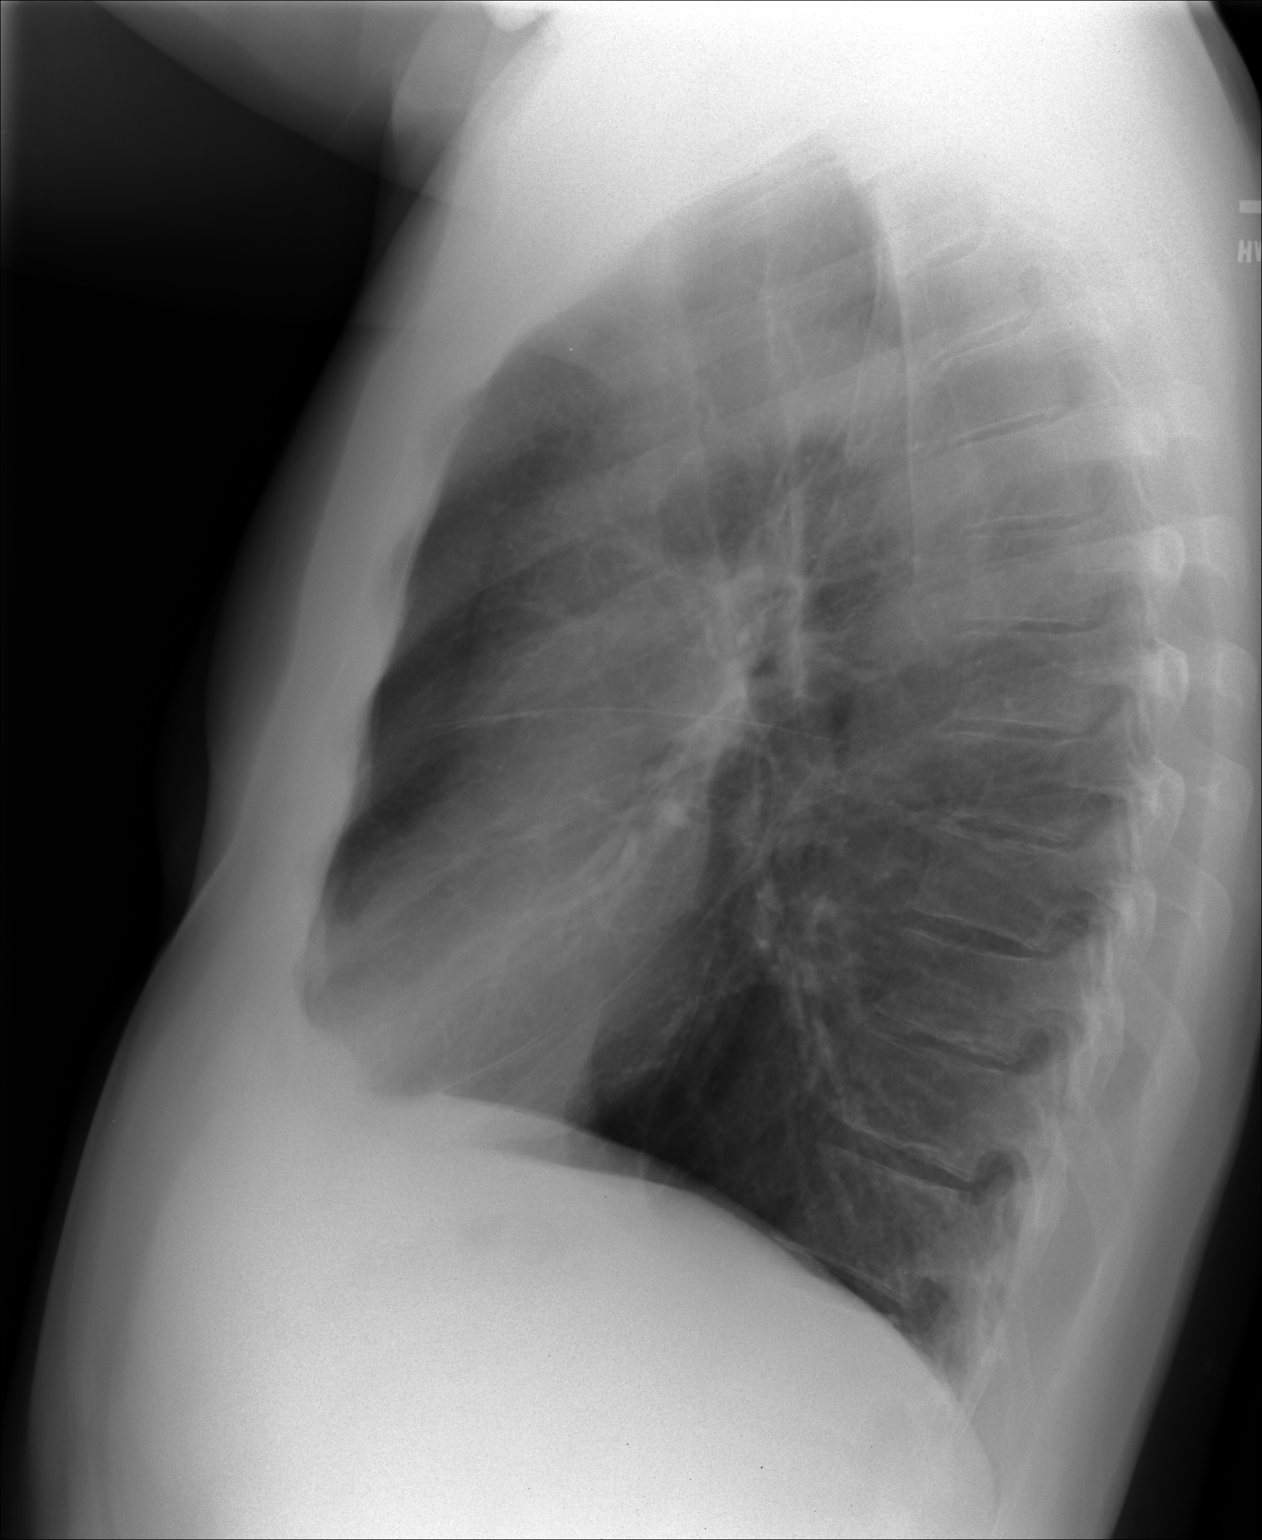

[2 of 2 positions shown; findings below may reference images not displayed]

FINDINGS: Cardiopericardial silhouette within normal limits.
Mediastinal contours normal. Trachea midline.  No airspace disease
or effusion.  Hyperinflation is present.  Flattening of the
hemidiaphragms and enlargement retrosternal clear space compatible
with emphysema. Extreme costophrenic angles are excluded from view.
IMPRESSION: Emphysema without acute cardiopulmonary disease.

Clinically significant discrepancy from primary report, if
provided: None

## 2012-02-20 MED ORDER — FLUTICASONE PROPIONATE 50 MCG/ACT NA SUSP: 2.0000 | Freq: Every day | NASAL | Status: DC

## 2012-02-20 MED ORDER — KETOCONAZOLE 2 % EX CREA: TOPICAL_CREAM | Freq: Every day | CUTANEOUS | Status: DC

## 2012-02-20 MED ORDER — ATORVASTATIN CALCIUM 10 MG PO TABS: 10.0000 mg | ORAL_TABLET | Freq: Every day | ORAL | Status: DC

## 2012-02-20 MED ORDER — LOSARTAN POTASSIUM 50 MG PO TABS: 50.0000 mg | ORAL_TABLET | Freq: Every day | ORAL | Status: DC

## 2012-02-20 MED ORDER — OMEPRAZOLE 40 MG PO CPDR: 40.0000 mg | DELAYED_RELEASE_CAPSULE | Freq: Every day | ORAL | Status: DC

## 2012-02-20 MED ORDER — HYDROCODONE-ACETAMINOPHEN 7.5-750 MG PO TABS: 1.0000 | ORAL_TABLET | Freq: Four times a day (QID) | ORAL | Status: DC | PRN

## 2012-02-20 NOTE — Progress Notes (Signed)
@[facility] @  Patient ID: Douglas [name] MRN: [id number], DOB: [birth year] 56 y.o. Date of Encounter: 02/20/2012, 8:37 AM  Primary Physician: Lucilla Edin, MD  Chief Complaint: Physical (CPE)  HPI: 56 y.o. y/o male with history noted below here for CPE.  Doing well. No issues/complaints.  Review of Systems: Patient has been under a lot of stress recently with his wife's recent surgery for colloid cyst in the third ventricle Consitutional: No fever, chills, fatigue, night sweats, lymphadenopathy, or weight changes. Eyes: No visual changes, eye redness, or discharge. ENT/Mouth: Ears: No otalgia, tinnitus, hearing loss, discharge. Nose: No congestion, rhinorrhea, sinus pain, or epistaxis. Throat: No sore throat, post nasal drip, or teeth pain. Cardiovascular: No CP, palpitations, diaphoresis, DOE, edema, orthopnea, PND. Respiratory: No cough, hemoptysis, SOB, or wheezing. Gastrointestinal: No anorexia, dysphagia, reflux, pain, nausea, vomiting, hematemesis, diarrhea, constipation, BRBPR, or melena. Genitourinary: No dysuria, frequency, urgency, hematuria, incontinence, nocturia, decreased urinary stream, discharge, impotence, or testicular pain/masses patient was found to have a renal mass on recent ultrasound screening and is scheduled for CT of the kidney on Friday. Musculoskeletal: He has a lot of discomfort in his neck and into her shoulder and is probably going to need surgery on his shoulder.  Skin: No rash, erythema, lesion changes, pain, warmth, jaundice, or pruritis. Neurological: No headache, dizziness, syncope, seizures, tremors, memory loss, coordination problems, or paresthesias. Psychological: No anxiety, depression, hallucinations, SI/HI. Endocrine: No fatigue, polydipsia, polyphagia, polyuria, or known diabetes. All other systems were reviewed and are otherwise negative.  Past Medical History  Diagnosis Date  . Renal calculi   . Compression fracture     S/P L-SPINE  .  GERD (gastroesophageal reflux disease)   . History of surgical fusion joint     DDD C-SPINE  . Hemoptysis   . Allergic rhinitis   . Osteoarthritis   . Hyperlipidemia   . Encounter for long-term (current) use of other medications   . COPD (chronic obstructive pulmonary disease)     FVC .66  . Tobacco use disorder   . Melanoma in situ   . Unspecified vitamin D deficiency   . Leiomyoma OF SKIN    INCREASED RISK RENAL CELL CA  NEEDS CT OF KIDNEYS EVERY 2 YEARS NEXT DUE  02/06/13  . Hypertension      Past Surgical History  Procedure Date  . Anterior cruciate ligament repair     BILATERAL  . Cervicalfusion     with bone graft from right hip  . Hernia repair   . Hand ligament reconstruction     right hand    Home Meds:  Prior to Admission medications   Medication Sig Start Date End Date Taking? Authorizing Provider  atorvastatin (LIPITOR) 10 MG tablet Take 1 tablet (10 mg total) by mouth at bedtime. 09/13/11  Yes Morrell Riddle, PA-C  b complex vitamins tablet Take 1 tablet by mouth daily.   Yes Historical Provider, MD  cholecalciferol (VITAMIN D) 1000 UNITS tablet Take 1,000 Units by mouth daily.   Yes Historical Provider, MD  fluticasone (FLONASE) 50 MCG/ACT nasal spray Place 2 sprays into the nose daily. 09/13/11  Yes Morrell Riddle, PA-C  HYDROcodone-acetaminophen (VICODIN ES) 7.5-750 MG per tablet Take 1 tablet by mouth every 6 (six) hours as needed. 12/17/11  Yes Collene Gobble, MD  losartan (COZAAR) 50 MG tablet Take 1 tablet (50 mg total) by mouth daily. 09/13/11  Yes Morrell Riddle, PA-C  Multiple Vitamin (MULTIVITAMIN) tablet Take 1 tablet by  mouth daily.   Yes Historical Provider, MD  omeprazole (PRILOSEC) 40 MG capsule Take 20 mg by mouth daily.    Yes Historical Provider, MD  Pseudoephedrine HCl (SUDAFED PO) Take by mouth as needed.   Yes Historical Provider, MD  vitamin C (ASCORBIC ACID) 500 MG tablet Take 500 mg by mouth daily.   Yes Historical Provider, MD  vitamin E 400  UNIT capsule Take 400 Units by mouth daily.   Yes Historical Provider, MD  PHENYLEPHRINE HCL PO Take 1-2 tablets by mouth daily.    Historical Provider, MD    Allergies:  Allergies  Allergen Reactions  . Avelox (Moxifloxacin Hcl In Nacl) Hives and Swelling    History   Social History  . Marital Status: Married    Spouse Name: N/A    Number of Children: N/A  . Years of Education: N/A   Occupational History  . Not on file.   Social History Main Topics  . Smoking status: Current Everyday Smoker -- 0.5 packs/day for 35 years    Types: Cigarettes  . Smokeless tobacco: Current User    Types: Snuff   Comment: Pt chewing Nicorette gum currently  . Alcohol Use: 12.6 oz/week    21 Cans of beer per week     3 beers daily  . Drug Use: No  . Sexually Active: Not on file   Other Topics Concern  . Not on file   Social History Narrative  . No narrative on file    Family History  Problem Relation Age of Onset  . Adopted: Yes    Physical Exam:  Blood pressure 122/82, pulse 72, temperature 98.2 F (36.8 C), temperature source Oral, resp. rate 16, height 6\' 1"  (1.854 m), weight 185 lb 12.8 oz (84.278 kg), SpO2 97.00%.  General: Well developed, well nourished, in no acute distress. HEENT: Normocephalic, atraumatic. Conjunctiva pink, sclera non-icteric. Pupils 2 mm constricting to 1 mm, round, regular, and equally reactive to light and accomodation. EOMI. Internal auditory canal clear. TMs with good cone of light and without pathology. Nasal mucosa pink. Nares are without discharge. No sinus tenderness. Oral mucosa pink. Dentition . Pharynx without exudate.   Neck: Supple. Trachea midline. No thyromegaly. Full ROM. No lymphadenopathy. Lungs: Clear to auscultation bilaterally without wheezes, rales, or rhonchi. Breathing is of normal effort and unlabored. Cardiovascular: RRR with S1 S2. No murmurs, rubs, or gallops appreciated. Distal pulses 2+ symmetrically. No carotid or abdominal  bruits.  Abdomen: Soft, non-tender, non-distended with normoactive bowel sounds. No hepatosplenomegaly or masses. No rebound/guarding. No CVA tenderness. Without hernias.  Rectal: No external hemorrhoids or fissures. Rectal vault without masses.   Genitourinary:   circumcised male. No penile lesions. Testes descended bilaterally, and smooth without tenderness or masses.  Musculoskeletal: Full range of motion and 5/5 strength throughout. Without swelling, atrophy, tenderness, crepitus, or warmth. Extremities without clubbing, cyanosis, or edema. Calves supple. Skin: Warm and moist without erythema, ecchymosis, wounds, Is redness and scaling involving the right anterior portion of the distal right foot as well as some redness over the lateral portion of the left foot. Neuro: A+Ox3. CN II-XII grossly intact. Moves all extremities spontaneously. Full sensation throughout. Normal gait. DTR 2+ throughout upper and lower extremities. Finger to nose intact. Psych:  Responds to questions appropriately with a normal affect.   Studies: CBC, CMET, Lipid, PSA, TSH,   all pending. Patient is doing fairly well. For CT of his kidney on Friday and then make a urological referral following that.  He is continuing to smoke but using Nicorette gum. We'll check labs plus EKG chest x-ray today.  UA: [facility] reading (PRIMARY) by  Dr.Travaris Kosh COPD   Assessment/Plan:  56 y.o. y/o   male here for CPE  -  Signed, Earl Lites, MD 02/20/2012 8:37 AM

## 2012-02-21 ENCOUNTER — Encounter: Payer: Self-pay | Admitting: *Deleted

## 2012-02-23 ENCOUNTER — Ambulatory Visit
Admission: RE | Admit: 2012-02-23 | Discharge: 2012-02-23 | Disposition: A | Discharge status: Home / Self Care | Payer: BC Managed Care – PPO | Source: Ambulatory Visit | Attending: Emergency Medicine | Admitting: Emergency Medicine

## 2012-02-23 ENCOUNTER — Other Ambulatory Visit: Payer: Self-pay | Admitting: Emergency Medicine

## 2012-02-23 ENCOUNTER — Telehealth: Payer: Self-pay

## 2012-02-23 DIAGNOSIS — N281 Cyst of kidney, acquired: Secondary | ICD-10-CM

## 2012-02-23 IMAGING — CT CT ABD-PEL WO/W CM
2 of 9 series · 12 of 36 positions shown, 18 images · IV contrast (READICAT/WATER)
Comparison: [DATE]

CLINICAL DATA: Follow up abnormal ultrasound

CT ABDOMEN AND PELVIS WITHOUT AND WITH CONTRAST
TECHNIQUE: Multidetector CT imaging of the abdomen and pelvis was
performed without contrast material in one or both body regions,
followed by contrast material(s) and further sections in one or
both body regions.
Contrast: 100mL OMNIPAQUE IOHEXOL 300 MG/ML  SOLN

[Series 5: arterial/nephrographic · axial · arterial · 0.74mm/px · z∈[-440,-75]mm · 10 of 349 slices shown, 16 images]
[im 32/349  soft-tissue]
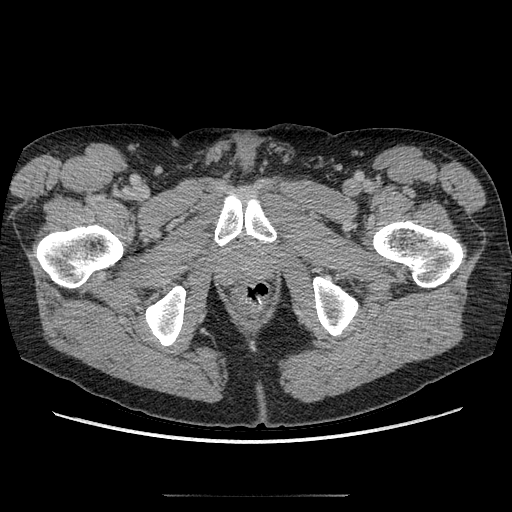
[im 32/349  bone]
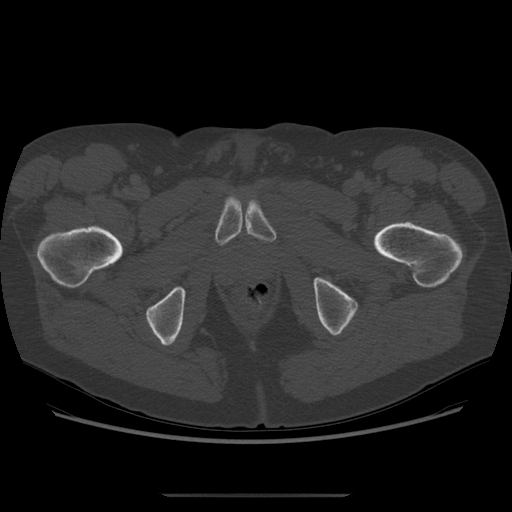
[im 64/349  soft-tissue]
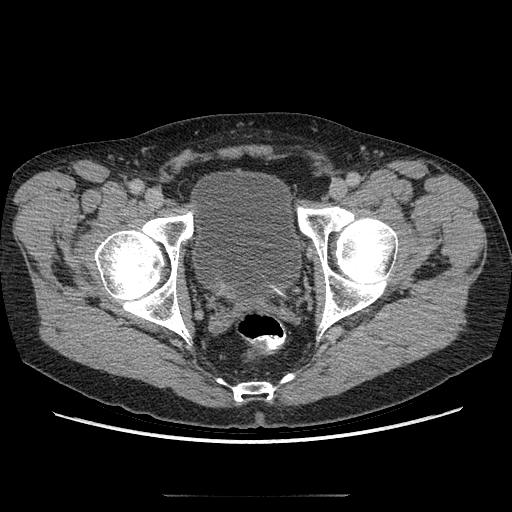
[im 95/349  soft-tissue]
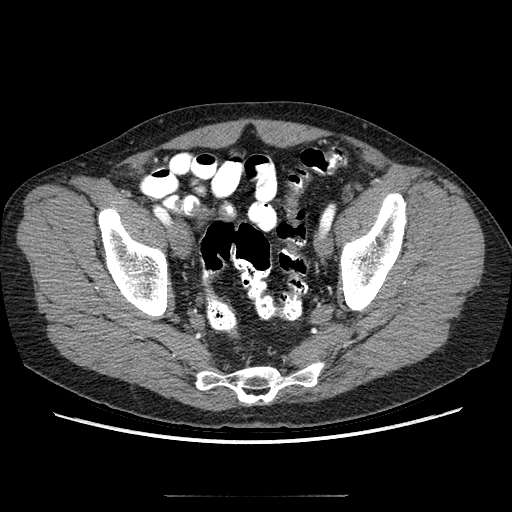
[im 127/349  soft-tissue]
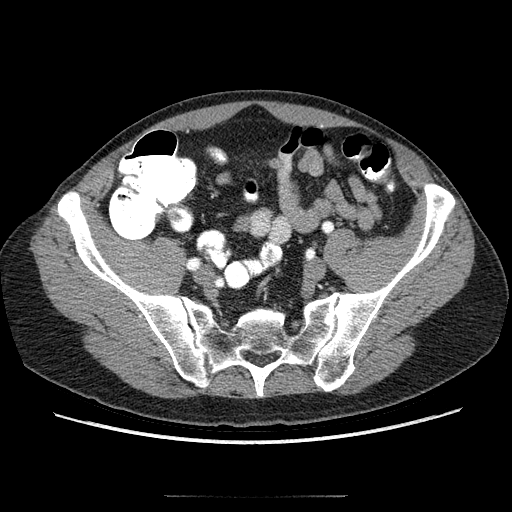
[im 159/349  soft-tissue]
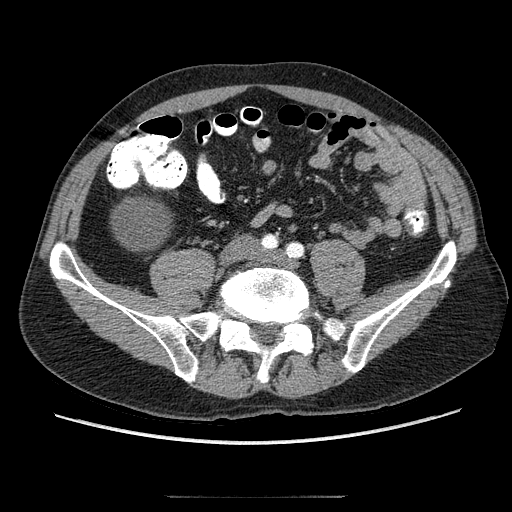
[im 190/349  soft-tissue]
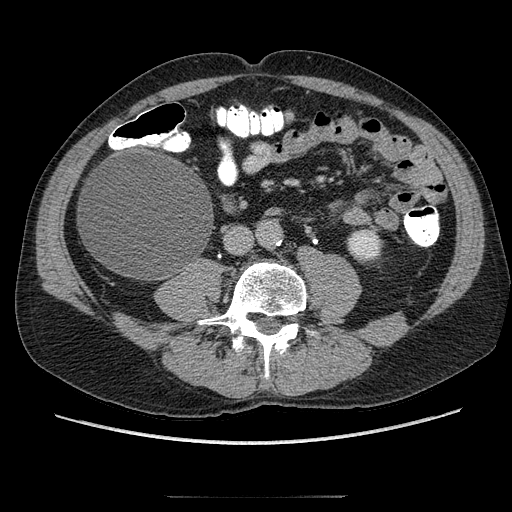
[im 222/349  soft-tissue]
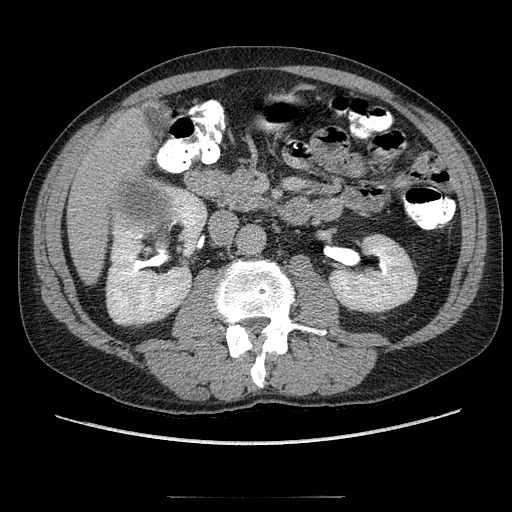
[im 222/349  lung]
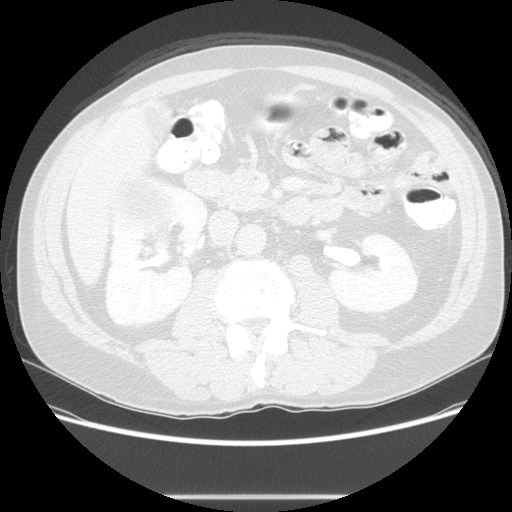
[im 254/349  soft-tissue]
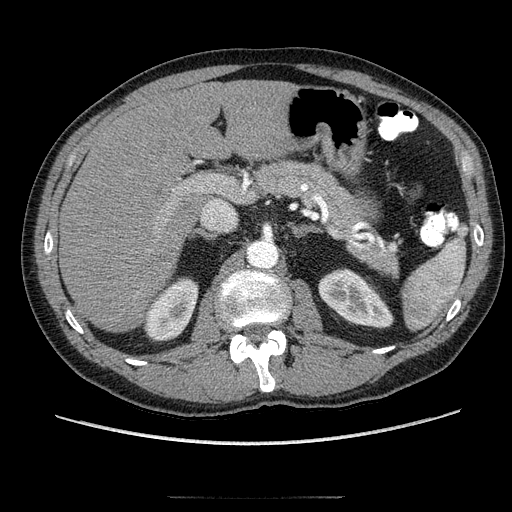
[im 254/349  lung]
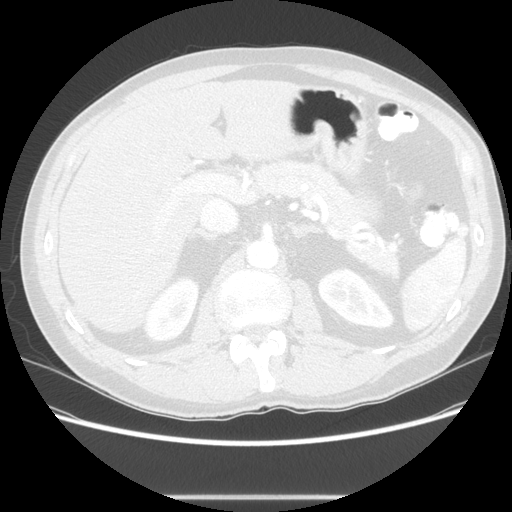
[im 285/349  soft-tissue]
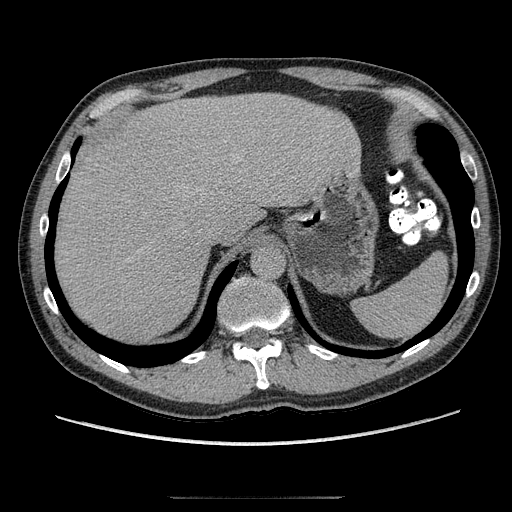
[im 285/349  lung]
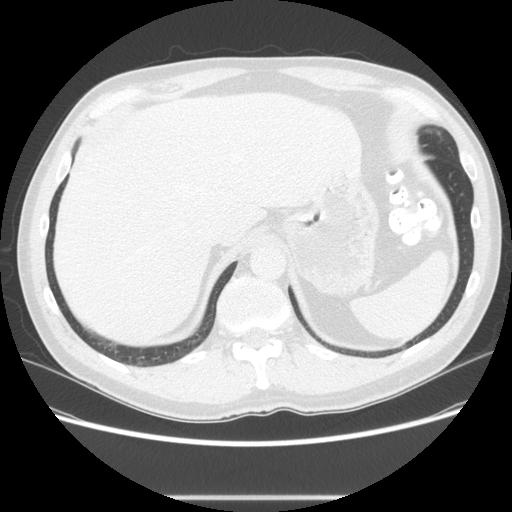
[im 285/349  bone]
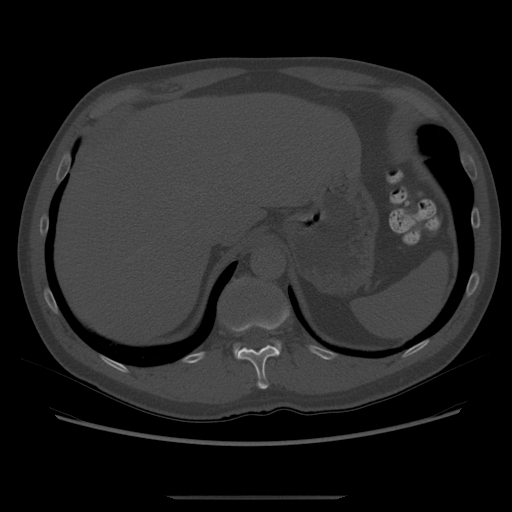
[im 317/349  soft-tissue]
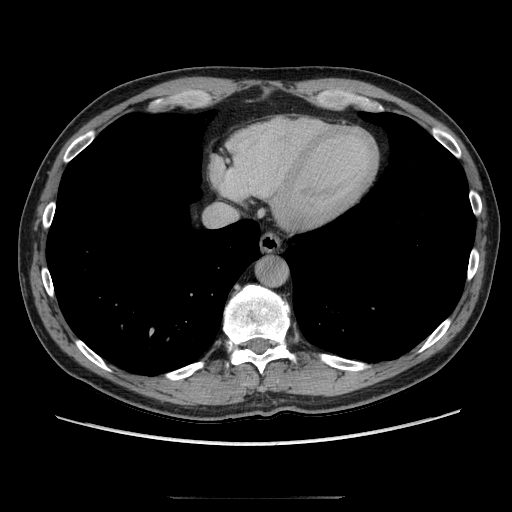
[im 317/349  lung]
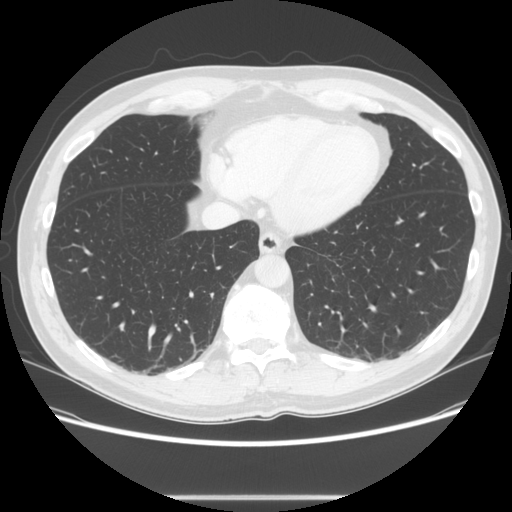

[Series 602: sagittal body · sagittal · 0.80mm/px · 2 of 153 slices shown]
[im 39/153  soft-tissue]
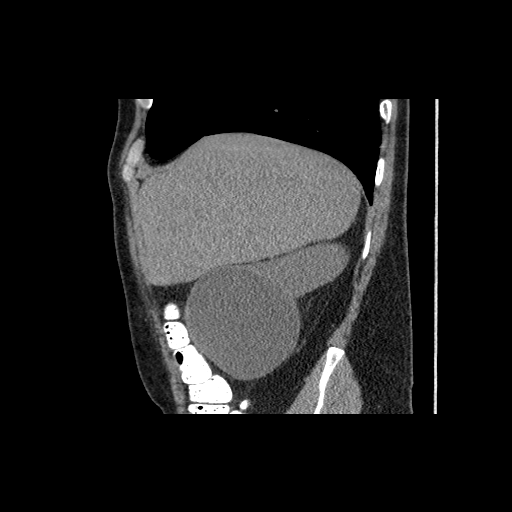
[im 77/153  soft-tissue]
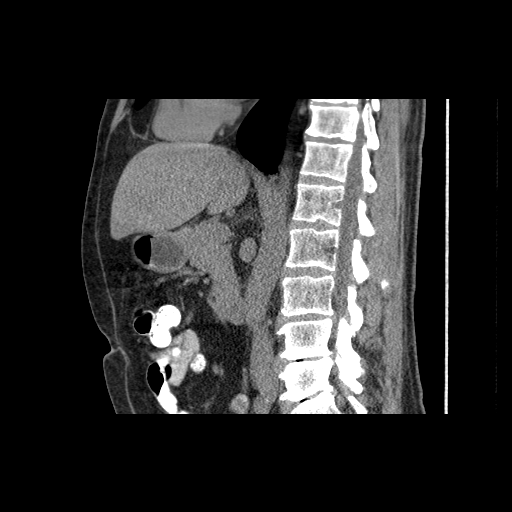

[12 of 36 positions shown; findings below may reference images not displayed]

FINDINGS: The lung bases appear clear.  There is no pericardial or
pleural effusion identified.  Small hypodensity within the right
hepatic lobe measures 3 mm, image 41.  Too small to reliably
characterize.  The gallbladder appears within normal limits.  The
pancreas appears normal.  Normal appearance of the spleen.

Both adrenal glands are normal.  The left kidney is unremarkable.
Large cyst is identified involving the lower pole of the right
kidney.  This measures 8.4 x 9.8 cm, image 255 of series 5. Along
the medial wall of this cyst there is a small enhancing soft tissue
attenuating structure which measures 6 x 10.30 mm, image 75. This
is indeterminate.  This may represent small area of renal
parenchyma versus
small renal cell carcinoma.  This is unchanged when compared with
[DATE].   Again noted is a small 4 mm nonobstructing stone
within the lower pole of the right kidney, image 69.  The urinary
bladder appears normal.  The prostate gland and seminal vesicles
are unremarkable.

No enlarged lymph nodes within the upper abdomen.  There is no
pelvic or inguinal adenopathy identified.

The stomach and the small bowel loops are normal in appearance.
The appendix is identified and appears normal.  Normal appearance
of the colon.

Review of the visualized osseous structures shows a sclerotic
density within the left iliac bone.  This contains a chondroid
matrix and likely represents a benign enchondroma or bone infarct.
Degenerative disc disease is seen at multiple levels within the
lumbar spine.
IMPRESSION: 1.  Bosniak type IIF  right renal cyst.  Continued follow-up is
recommended to ensure stability.  The next follow-up examination
should be obtained in 1 year.

## 2012-02-23 MED ORDER — IOHEXOL 300 MG/ML  SOLN
100.0000 mL | Freq: Once | INTRAMUSCULAR | Status: AC | PRN
  Administered 2012-02-23: 100 mL via INTRAVENOUS

## 2012-02-23 NOTE — Telephone Encounter (Signed)
Pt called and states dr Cleta Alberts wrote rx for mail order comp and scripts were faxed to them at the time of appt on Tuesday 02/20/12. Per pt mailorder comp has questions re: the script. They need [place] to call them at [phone] and reference order number [id number] to resolve

## 2012-02-27 NOTE — Telephone Encounter (Signed)
Called Mail Order and clarified rx for Flonase.

## 2012-04-16 ENCOUNTER — Other Ambulatory Visit: Payer: Self-pay | Admitting: Urology

## 2012-05-06 ENCOUNTER — Encounter (HOSPITAL_COMMUNITY): Payer: Self-pay | Admitting: Pharmacy Technician

## 2012-05-08 ENCOUNTER — Other Ambulatory Visit (HOSPITAL_COMMUNITY): Payer: Self-pay | Admitting: Urology

## 2012-05-09 ENCOUNTER — Encounter (HOSPITAL_COMMUNITY): Payer: Self-pay

## 2012-05-09 ENCOUNTER — Encounter (HOSPITAL_COMMUNITY)
Admission: RE | Admit: 2012-05-09 | Discharge: 2012-05-09 | Disposition: A | Discharge status: Home / Self Care | Payer: BC Managed Care – PPO | Source: Ambulatory Visit | Attending: Urology | Admitting: Urology

## 2012-05-09 LAB — BASIC METABOLIC PANEL
BUN: 13 mg/dL (ref 6–23)
Chloride: 103 mEq/L (ref 96–112)
Creatinine, Ser: 0.92 mg/dL (ref 0.50–1.35)
GFR calc Af Amer: >90 mL/min (ref >90)
Glucose, Bld: 70 mg/dL (ref 70–99)

## 2012-05-09 LAB — CBC
HCT: 45.5 % (ref 39.0–52.0)
Hemoglobin: 16.1 g/dL (ref 13.0–17.0)
MCH: 32.5 pg (ref 26.0–34.0)
MCHC: 35.4 g/dL (ref 30.0–36.0)
RDW: 12.6 % (ref 11.5–15.5)

## 2012-05-09 LAB — SURGICAL PCR SCREEN: Staphylococcus aureus: POSITIVE — AB

## 2012-05-09 NOTE — Patient Instructions (Signed)
20      Your procedure is scheduled on:  Wednesday 05/22/2012 at 1215 pm   Report to [facility] at 0945 AM.  Call this number if you have problems the morning of surgery: [phone]   Remember: FOLLOW BOWEL PREP INSTRUCTIONS FROM DR. MANNY'S OFFICE AND ON CLEAR LIQUID DIET 05/21/2012 ALL DAY!   Do not eat food after 05/20/2012 and then ON CLEAR LIQUID DIET Tuesday 05/21/2012.  Take these medicines the morning of surgery with A SIP OF WATER: Prilosec   Do not bring valuables to the hospital.  .  Leave suitcase in the car. After surgery it may be brought to your room.  For patients admitted to the hospital, checkout time is 11:00 AM the day of              Discharge.    Special Instructions: See [facility] Preparing  For Surgery Instruction Sheet. Do not wear jewelry, lotions powders, perfumes. Women do not shave legs or underarms for 12 hours before showers. Contacts, partial plates,  or dentures may not be worn into surgery.                          Patients discharged the day of surgery will not be allowed to drive home. If going home the same day of surgery, must have someone stay with you first 24 hrs.at home and arrange for someone to drive you home from the              Hospital. YOUR DRIVER IS:   Please read over the following fact sheets that you were given: MRSA INFORMATION,INCENTIVE SPIROMETRY SHEET, SLEEP APNEA SHEET, BLOOD TRANSFUSION SHEET                            Telford Nab.Shene Maxfield,RN,BSN     [phone]

## 2012-05-09 NOTE — Progress Notes (Signed)
05/09/12 1130  OBSTRUCTIVE SLEEP APNEA  Have you ever been diagnosed with sleep apnea through a sleep study? No  Do you snore loudly (loud enough to be heard through closed doors)?  1  Do you often feel tired, fatigued, or sleepy during the daytime? 0  Has anyone observed you stop breathing during your sleep? 0  Do you have, or are you being treated for high blood pressure? 1  BMI more than 35 kg/m2? 0  Age over 57 years old? 1  Neck circumference greater than 40 cm/18 inches? 0  Gender: 1  Obstructive Sleep Apnea Score 4   Score 4 or greater  Results sent to PCP

## 2012-05-10 ENCOUNTER — Other Ambulatory Visit: Payer: Self-pay | Admitting: Emergency Medicine

## 2012-05-14 ENCOUNTER — Telehealth: Payer: Self-pay

## 2012-05-14 NOTE — Telephone Encounter (Signed)
Called pt per Dr Ellis Parents request to advise him his pre-surg OSA screen was pos and asked pt if he would like [place] to refer him for a sleep study. Pt stated that he is scheduled for surgery next week and really doesn't want to schedule anything else until he has recovered from that. Pt states that he thinks he just snores (probably from his deviated septum), but he doesn't have any daytime sleepiness and wife has never noticed that he stops breathing while sleeping. Pt will discuss w/Dr Cleta Alberts at next OV, or will call after his surgery to get something scheduled. Dr Cleta Alberts, forwarding this to you just FYI.

## 2012-05-21 ENCOUNTER — Telehealth: Payer: Self-pay | Admitting: Emergency Medicine

## 2012-05-22 ENCOUNTER — Ambulatory Visit (HOSPITAL_COMMUNITY): Payer: BC Managed Care – PPO | Admitting: Anesthesiology

## 2012-05-22 ENCOUNTER — Encounter (HOSPITAL_COMMUNITY): Payer: Self-pay | Admitting: *Deleted

## 2012-05-22 ENCOUNTER — Encounter (HOSPITAL_COMMUNITY): Payer: Self-pay | Admitting: Anesthesiology

## 2012-05-22 ENCOUNTER — Encounter (HOSPITAL_COMMUNITY)
Admission: RE | Disposition: A | Discharge status: Home / Self Care | Payer: Self-pay | Source: Ambulatory Visit | Attending: Urology

## 2012-05-22 ENCOUNTER — Inpatient Hospital Stay (HOSPITAL_COMMUNITY)
Admission: RE | Admit: 2012-05-22 | Discharge: 2012-05-24 | DRG: 305 | Disposition: A | Discharge status: Home / Self Care | Payer: BC Managed Care – PPO | Source: Ambulatory Visit | Attending: Urology | Admitting: Urology

## 2012-05-22 DIAGNOSIS — Q619 Cystic kidney disease, unspecified: Secondary | ICD-10-CM

## 2012-05-22 DIAGNOSIS — N289 Disorder of kidney and ureter, unspecified: Principal | ICD-10-CM | POA: Diagnosis present

## 2012-05-22 HISTORY — PX: ROBOTIC ASSITED PARTIAL NEPHRECTOMY: SHX6087

## 2012-05-22 LAB — BASIC METABOLIC PANEL
BUN: 13 mg/dL (ref 6–23)
CO2: 26 mEq/L (ref 19–32)
Chloride: 102 mEq/L (ref 96–112)
Creatinine, Ser: 1.23 mg/dL (ref 0.50–1.35)
Glucose, Bld: 130 mg/dL — ABNORMAL HIGH (ref 70–99)

## 2012-05-22 LAB — HEMOGLOBIN AND HEMATOCRIT, BLOOD
HCT: 41.3 % (ref 39.0–52.0)
Hemoglobin: 15.2 g/dL (ref 13.0–17.0)

## 2012-05-22 LAB — TYPE AND SCREEN
ABO/RH(D): B POS
Antibody Screen: NEGATIVE

## 2012-05-22 SURGERY — ROBOTIC ASSITED PARTIAL NEPHRECTOMY
Anesthesia: General | Laterality: Right | Wound class: Clean

## 2012-05-22 MED ORDER — OXYCODONE HCL 5 MG/5ML PO SOLN
5.0000 mg | Freq: Once | ORAL | Status: DC | PRN
  Filled 2012-05-22: qty 5

## 2012-05-22 MED ORDER — ACETAMINOPHEN 10 MG/ML IV SOLN
INTRAVENOUS | Status: AC
  Filled 2012-05-22: qty 100

## 2012-05-22 MED ORDER — ONDANSETRON HCL 4 MG/2ML IJ SOLN
INTRAMUSCULAR | Status: DC | PRN
  Administered 2012-05-22 (×2): 2 mg via INTRAVENOUS

## 2012-05-22 MED ORDER — DEXAMETHASONE SODIUM PHOSPHATE 10 MG/ML IJ SOLN
INTRAMUSCULAR | Status: DC | PRN
  Administered 2012-05-22: 10 mg via INTRAVENOUS

## 2012-05-22 MED ORDER — ONDANSETRON HCL 4 MG/2ML IJ SOLN
4.0000 mg | INTRAMUSCULAR | Status: DC | PRN
  Administered 2012-05-22 – 2012-05-23 (×2): 4 mg via INTRAVENOUS
  Filled 2012-05-22 (×2): qty 2

## 2012-05-22 MED ORDER — HYDROMORPHONE HCL PF 1 MG/ML IJ SOLN: 0.2500 mg | INTRAMUSCULAR | Status: DC | PRN

## 2012-05-22 MED ORDER — PANTOPRAZOLE SODIUM 40 MG PO TBEC
40.0000 mg | DELAYED_RELEASE_TABLET | Freq: Every day | ORAL | Status: DC
  Administered 2012-05-23 – 2012-05-24 (×2): 40 mg via ORAL
  Filled 2012-05-22 (×2): qty 1

## 2012-05-22 MED ORDER — HYDROMORPHONE HCL PF 1 MG/ML IJ SOLN
INTRAMUSCULAR | Status: AC
  Filled 2012-05-22: qty 1

## 2012-05-22 MED ORDER — PROPOFOL 10 MG/ML IV EMUL
INTRAVENOUS | Status: DC | PRN
  Administered 2012-05-22: 200 mg via INTRAVENOUS

## 2012-05-22 MED ORDER — CEFAZOLIN SODIUM-DEXTROSE 2-3 GM-% IV SOLR
2.0000 g | INTRAVENOUS | Status: AC
  Administered 2012-05-22: 2 g via INTRAVENOUS

## 2012-05-22 MED ORDER — ACETAMINOPHEN 10 MG/ML IV SOLN: 1000.0000 mg | Freq: Once | INTRAVENOUS | Status: DC | PRN

## 2012-05-22 MED ORDER — SUCCINYLCHOLINE CHLORIDE 20 MG/ML IJ SOLN
INTRAMUSCULAR | Status: DC | PRN
  Administered 2012-05-22: 100 mg via INTRAVENOUS

## 2012-05-22 MED ORDER — PROMETHAZINE HCL 25 MG/ML IJ SOLN: 6.2500 mg | INTRAMUSCULAR | Status: DC | PRN

## 2012-05-22 MED ORDER — BUPIVACAINE LIPOSOME 1.3 % IJ SUSP
20.0000 mL | Freq: Once | INTRAMUSCULAR | Status: AC
  Administered 2012-05-22: 18 mL
  Filled 2012-05-22: qty 20

## 2012-05-22 MED ORDER — STERILE WATER FOR IRRIGATION IR SOLN
Status: DC | PRN
  Administered 2012-05-22: 3000 mL

## 2012-05-22 MED ORDER — NEOSTIGMINE METHYLSULFATE 1 MG/ML IJ SOLN
INTRAMUSCULAR | Status: DC | PRN
  Administered 2012-05-22: 4 mg via INTRAVENOUS

## 2012-05-22 MED ORDER — ATORVASTATIN CALCIUM 10 MG PO TABS
10.0000 mg | ORAL_TABLET | Freq: Every day | ORAL | Status: DC
  Administered 2012-05-22 – 2012-05-23 (×2): 10 mg via ORAL
  Filled 2012-05-22 (×3): qty 1

## 2012-05-22 MED ORDER — CEFAZOLIN SODIUM-DEXTROSE 2-3 GM-% IV SOLR
INTRAVENOUS | Status: AC
  Filled 2012-05-22: qty 50

## 2012-05-22 MED ORDER — MANNITOL 25 % IV SOLN
25.0000 g | Freq: Once | INTRAVENOUS | Status: DC
  Filled 2012-05-22: qty 100

## 2012-05-22 MED ORDER — KCL IN DEXTROSE-NACL 10-5-0.45 MEQ/L-%-% IV SOLN
INTRAVENOUS | Status: AC
  Filled 2012-05-22: qty 1000

## 2012-05-22 MED ORDER — OXYCODONE HCL 5 MG PO TABS
5.0000 mg | ORAL_TABLET | ORAL | Status: DC | PRN
  Administered 2012-05-23 – 2012-05-24 (×5): 5 mg via ORAL
  Filled 2012-05-22 (×6): qty 1

## 2012-05-22 MED ORDER — OXYCODONE HCL 5 MG PO TABS: 5.0000 mg | ORAL_TABLET | Freq: Once | ORAL | Status: DC | PRN

## 2012-05-22 MED ORDER — LACTATED RINGERS IV SOLN
INTRAVENOUS | Status: DC
  Administered 2012-05-22: 1000 mL via INTRAVENOUS

## 2012-05-22 MED ORDER — CISATRACURIUM BESYLATE (PF) 10 MG/5ML IV SOLN
INTRAVENOUS | Status: DC | PRN
  Administered 2012-05-22: 6 mg via INTRAVENOUS
  Administered 2012-05-22: 2 mg via INTRAVENOUS

## 2012-05-22 MED ORDER — KCL IN DEXTROSE-NACL 10-5-0.45 MEQ/L-%-% IV SOLN
INTRAVENOUS | Status: DC
  Administered 2012-05-22: 1000 mL via INTRAVENOUS
  Administered 2012-05-23 (×2): via INTRAVENOUS
  Filled 2012-05-22 (×4): qty 1000

## 2012-05-22 MED ORDER — GLYCOPYRROLATE 0.2 MG/ML IJ SOLN
INTRAMUSCULAR | Status: DC | PRN
  Administered 2012-05-22: .6 mg via INTRAVENOUS

## 2012-05-22 MED ORDER — ACETAMINOPHEN 10 MG/ML IV SOLN
1000.0000 mg | Freq: Four times a day (QID) | INTRAVENOUS | Status: AC
  Administered 2012-05-22 – 2012-05-23 (×4): 1000 mg via INTRAVENOUS
  Filled 2012-05-22 (×4): qty 100

## 2012-05-22 MED ORDER — DOCUSATE SODIUM 100 MG PO CAPS
100.0000 mg | ORAL_CAPSULE | Freq: Two times a day (BID) | ORAL | Status: DC
  Administered 2012-05-22 – 2012-05-24 (×4): 100 mg via ORAL
  Filled 2012-05-22 (×5): qty 1

## 2012-05-22 MED ORDER — HYDROMORPHONE HCL PF 1 MG/ML IJ SOLN
0.2500 mg | INTRAMUSCULAR | Status: DC | PRN
  Administered 2012-05-22 (×5): 0.5 mg via INTRAVENOUS

## 2012-05-22 MED ORDER — ACETAMINOPHEN 325 MG PO TABS: 650.0000 mg | ORAL_TABLET | ORAL | Status: DC | PRN

## 2012-05-22 MED ORDER — LACTATED RINGERS IV SOLN
INTRAVENOUS | Status: DC | PRN
  Administered 2012-05-22 (×2): via INTRAVENOUS

## 2012-05-22 MED ORDER — LACTATED RINGERS IR SOLN
Status: DC | PRN
  Administered 2012-05-22: 1000 mL

## 2012-05-22 MED ORDER — OMEPRAZOLE MAGNESIUM 20 MG PO TBEC: 20.0000 mg | DELAYED_RELEASE_TABLET | Freq: Every day | ORAL | Status: DC

## 2012-05-22 MED ORDER — SENNA 8.6 MG PO TABS
1.0000 | ORAL_TABLET | Freq: Two times a day (BID) | ORAL | Status: DC
  Administered 2012-05-22 – 2012-05-24 (×4): 8.6 mg via ORAL
  Filled 2012-05-22 (×4): qty 1

## 2012-05-22 MED ORDER — LIDOCAINE HCL (CARDIAC) 20 MG/ML IV SOLN
INTRAVENOUS | Status: DC | PRN
  Administered 2012-05-22: 100 mg via INTRAVENOUS

## 2012-05-22 MED ORDER — ACETAMINOPHEN 10 MG/ML IV SOLN
INTRAVENOUS | Status: DC | PRN
  Administered 2012-05-22: 1000 mg via INTRAVENOUS

## 2012-05-22 MED ORDER — MEPERIDINE HCL 50 MG/ML IJ SOLN: 6.2500 mg | INTRAMUSCULAR | Status: DC | PRN

## 2012-05-22 MED ORDER — HYDROMORPHONE HCL PF 1 MG/ML IJ SOLN
0.5000 mg | INTRAMUSCULAR | Status: DC | PRN
  Administered 2012-05-22 – 2012-05-24 (×10): 1 mg via INTRAVENOUS
  Filled 2012-05-22 (×9): qty 1

## 2012-05-22 MED ORDER — FLUTICASONE PROPIONATE 50 MCG/ACT NA SUSP
1.0000 | Freq: Every day | NASAL | Status: DC
  Administered 2012-05-22 – 2012-05-23 (×2): 1 via NASAL
  Filled 2012-05-22 (×2): qty 16

## 2012-05-22 MED ORDER — MIDAZOLAM HCL 5 MG/5ML IJ SOLN
INTRAMUSCULAR | Status: DC | PRN
  Administered 2012-05-22 (×2): 1 mg via INTRAVENOUS
  Administered 2012-05-22: 2 mg via INTRAVENOUS

## 2012-05-22 MED ORDER — LOSARTAN POTASSIUM 50 MG PO TABS
50.0000 mg | ORAL_TABLET | Freq: Every day | ORAL | Status: DC
  Administered 2012-05-23 – 2012-05-24 (×2): 50 mg via ORAL
  Filled 2012-05-22 (×2): qty 1

## 2012-05-22 MED ORDER — FENTANYL CITRATE 0.05 MG/ML IJ SOLN
INTRAMUSCULAR | Status: DC | PRN
  Administered 2012-05-22: 50 ug via INTRAVENOUS
  Administered 2012-05-22: 100 ug via INTRAVENOUS
  Administered 2012-05-22 (×3): 50 ug via INTRAVENOUS
  Administered 2012-05-22: 100 ug via INTRAVENOUS
  Administered 2012-05-22: 50 ug via INTRAVENOUS

## 2012-05-22 SURGICAL SUPPLY — 79 items
ADH SKN CLS APL DERMABOND .7 (GAUZE/BANDAGES/DRESSINGS) ×1
APL ESCP 34 STRL LF DISP (HEMOSTASIS)
APPLICATOR SURGIFLO ENDO (HEMOSTASIS) IMPLANT
BAG SPEC RTRVL LRG 6X4 10 (ENDOMECHANICALS) ×2
CANNULA SEAL DVNC (CANNULA) ×3 IMPLANT
CANNULA SEALS DA VINCI (CANNULA)
CHLORAPREP W/TINT 26ML (MISCELLANEOUS) ×2 IMPLANT
CLIP LIGATING HEM O LOK PURPLE (MISCELLANEOUS) ×1 IMPLANT
CLIP LIGATING HEMO LOK XL GOLD (MISCELLANEOUS) ×2 IMPLANT
CLIP LIGATING HEMO O LOK GREEN (MISCELLANEOUS) ×2 IMPLANT
CLOTH BEACON ORANGE TIMEOUT ST (SAFETY) ×2 IMPLANT
CORDS BIPOLAR (ELECTRODE) ×2 IMPLANT
COVER SURGICAL LIGHT HANDLE (MISCELLANEOUS) ×2 IMPLANT
COVER TIP SHEARS 8 DVNC (MISCELLANEOUS) ×1 IMPLANT
COVER TIP SHEARS 8MM DA VINCI (MISCELLANEOUS) ×1
CUTTER FLEX LINEAR 45M (STAPLE) ×1 IMPLANT
DECANTER SPIKE VIAL GLASS SM (MISCELLANEOUS) ×1 IMPLANT
DERMABOND ADVANCED (GAUZE/BANDAGES/DRESSINGS) ×1
DERMABOND ADVANCED .7 DNX12 (GAUZE/BANDAGES/DRESSINGS) ×2 IMPLANT
DRAIN CHANNEL 15F RND FF 3/16 (WOUND CARE) ×2 IMPLANT
DRAPE INCISE IOBAN 66X45 STRL (DRAPES) ×2 IMPLANT
DRAPE LAPAROSCOPIC ABDOMINAL (DRAPES) ×2 IMPLANT
DRAPE LG THREE QUARTER DISP (DRAPES) ×4 IMPLANT
DRAPE TABLE BACK 44X90 PK DISP (DRAPES) ×2 IMPLANT
DRAPE WARM FLUID 44X44 (DRAPE) ×2 IMPLANT
DRSG TEGADERM 4X4.75 (GAUZE/BANDAGES/DRESSINGS) ×1 IMPLANT
ELECT REM PT RETURN 9FT ADLT (ELECTROSURGICAL) ×4
ELECTRODE REM PT RTRN 9FT ADLT (ELECTROSURGICAL) ×2 IMPLANT
EVACUATOR SILICONE 100CC (DRAIN) ×2 IMPLANT
GAUZE SPONGE 2X2 8PLY STRL LF (GAUZE/BANDAGES/DRESSINGS) IMPLANT
GAUZE VASELINE 3X9 (GAUZE/BANDAGES/DRESSINGS) IMPLANT
GLOVE BIO SURGEON STRL SZ 6.5 (GLOVE) ×2 IMPLANT
GLOVE BIOGEL M STRL SZ7.5 (GLOVE) ×4 IMPLANT
GLOVE SURG SS PI 8.5 STRL IVOR (GLOVE) ×2
GLOVE SURG SS PI 8.5 STRL STRW (GLOVE) IMPLANT
GOWN STRL NON-REIN LRG LVL3 (GOWN DISPOSABLE) ×5 IMPLANT
GOWN STRL REIN 2XL LVL4 (GOWN DISPOSABLE) ×1 IMPLANT
GOWN STRL REIN XL XLG (GOWN DISPOSABLE) ×5 IMPLANT
KIT ACCESSORY DA VINCI DISP (KITS) ×1
KIT ACCESSORY DVNC DISP (KITS) ×1 IMPLANT
KIT BASIN OR (CUSTOM PROCEDURE TRAY) ×2 IMPLANT
LOOP VESSEL MAXI BLUE (MISCELLANEOUS) ×1 IMPLANT
NEEDLE INSUFFLATION 14GA 120MM (NEEDLE) ×2 IMPLANT
NS IRRIG 1000ML POUR BTL (IV SOLUTION) ×1 IMPLANT
PENCIL BUTTON HOLSTER BLD 10FT (ELECTRODE) ×2 IMPLANT
POSITIONER SURGICAL ARM (MISCELLANEOUS) IMPLANT
POUCH SPECIMEN RETRIEVAL 10MM (ENDOMECHANICALS) ×3 IMPLANT
RELOAD 45 VASCULAR/THIN (ENDOMECHANICALS) IMPLANT
RELOAD STAPLE 45 2.5 WHT GRN (ENDOMECHANICALS) ×3 IMPLANT
SET TUBE IRRIG SUCTION NO TIP (IRRIGATION / IRRIGATOR) IMPLANT
SOLUTION ANTI FOG 6CC (MISCELLANEOUS) ×2 IMPLANT
SOLUTION ELECTROLUBE (MISCELLANEOUS) ×2 IMPLANT
SPONGE DRAIN TRACH 4X4 STRL 2S (GAUZE/BANDAGES/DRESSINGS) ×1 IMPLANT
SPONGE GAUZE 2X2 STER 10/PKG (GAUZE/BANDAGES/DRESSINGS) ×1
SPONGE LAP 18X18 X RAY DECT (DISPOSABLE) ×1 IMPLANT
SPONGE LAP 4X18 X RAY DECT (DISPOSABLE) ×2 IMPLANT
SURGIFLO W/THROMBIN 8M KIT (HEMOSTASIS) ×1 IMPLANT
SUT ETHILON 3 0 PS 1 (SUTURE) ×3 IMPLANT
SUT MNCRL AB 4-0 PS2 18 (SUTURE) ×4 IMPLANT
SUT PDS AB 1 TP1 54 (SUTURE) ×2 IMPLANT
SUT PDS AB 1 TP1 96 (SUTURE) ×1 IMPLANT
SUT V-LOC BARB 180 2/0GR6 GS22 (SUTURE)
SUT V-LOC BARB 180 2/0GR9 GS23 (SUTURE)
SUT VIC AB 0 CT1 27 (SUTURE) ×2
SUT VIC AB 0 CT1 27XBRD ANTBC (SUTURE) ×2 IMPLANT
SUT VICRYL 0 UR6 27IN ABS (SUTURE) ×5 IMPLANT
SUT VLOC BARB 180 ABS3/0GR12 (SUTURE)
SUTURE V-LC BRB 180 2/0GR6GS22 (SUTURE) IMPLANT
SUTURE V-LC BRB 180 2/0GR9GS23 (SUTURE) IMPLANT
SUTURE VLOC BRB 180 ABS3/0GR12 (SUTURE) ×1 IMPLANT
SYR BULB IRRIGATION 50ML (SYRINGE) IMPLANT
TAPE CLOTH SURG 4X10 WHT LF (GAUZE/BANDAGES/DRESSINGS) ×1 IMPLANT
TOWEL OR NON WOVEN STRL DISP B (DISPOSABLE) ×3 IMPLANT
TRAY FOLEY CATH 14FRSI W/METER (CATHETERS) ×2 IMPLANT
TRAY LAP CHOLE (CUSTOM PROCEDURE TRAY) ×2 IMPLANT
TROCAR ENDOPATH XCEL 12X100 BL (ENDOMECHANICALS) ×2 IMPLANT
TROCAR XCEL 12X100 BLDLESS (ENDOMECHANICALS) ×2 IMPLANT
TUBING INSUFFLATION 10FT LAP (TUBING) ×2 IMPLANT
WATER STERILE IRR 1500ML POUR (IV SOLUTION) ×2 IMPLANT

## 2012-05-22 NOTE — Addendum Note (Signed)
Addendum  created 05/22/12 1702 by Einar Pheasant, MD   Modules edited:Orders, PRL Based Order Sets

## 2012-05-22 NOTE — Brief Op Note (Signed)
05/22/2012  3:06 PM  PATIENT:  Douglas [name]  56 y.o. male  PRE-OPERATIVE DIAGNOSIS:  RIGHT COMPLEX KIDNEY CYSTS  POST-OPERATIVE DIAGNOSIS:  RIGHT COMPLEX KIDNEY CYSTS  PROCEDURE:  Procedure(s) (LRB) with comments: ROBOTIC ASSITED PARTIAL NEPHRECTOMY (Right) - Right Robotic Cyst Decortication and Partial Nephrectomy   SURGEON:  Surgeon(s) and Role:    * Sebastian Ache, MD - Primary  PHYSICIAN ASSISTANT: Lujean Rave, PA  ASSISTANTS: none   ANESTHESIA:   local and general  EBL:  Total I/O In: 1000 [I.V.:1000] Out: 525 [Urine:100; Other:400; Blood:25]  BLOOD ADMINISTERED:none  DRAINS: (1) Jackson-Pratt drain(s) with closed bulb suction in the RLQ   LOCAL MEDICATIONS USED:  MARCAINE     SPECIMEN:  Source of Specimen:  1 - Rt Renal Cyst Wall 2- Rt Renal Cyst Nodule  DISPOSITION OF SPECIMEN:  PATHOLOGY  COUNTS:  YES  TOURNIQUET:  * No tourniquets in log *  DICTATION: .Other Dictation: Dictation Number  [id number]  PLAN OF CARE: Admit to inpatient   PATIENT DISPOSITION:  PACU - hemodynamically stable.   Delay start of Pharmacological VTE agent (>24hrs) due to surgical blood loss or risk of bleeding: yes

## 2012-05-22 NOTE — Progress Notes (Signed)
Note dressing around JP drain saturated with bloody drainage and scant oozing from dressing; JP drain emptied 30 cc; abdomen soft; scant amt bloody drainage at umbilicus; 2 x 2 dressing applied; Dr. Berneice Heinrich notified

## 2012-05-22 NOTE — H&P (Signed)
Douglas [name] is an 56 y.o. male.   Chief Complaint: Pre-Op Rt Robotic Renal Cyst Decortication + Partial Nephrectomy HPI:   1 -Expanding Right Renal Cyst with Enhancing Nodule - Incidental predominantly simple cyst first noted 2011 on w/u multiple leimyomas. First 8cm now 11+ cm by CT imaging this year with stable but enhancing 1cm nodule on antero-medial lip. [place] confimrs otherwise simple cyst. Rt 1 vein, 1 early branching dominant artery, 1 small accessory lower pole artery. Contralateral kidney normal / no additional lesions. No flank pain / early satiety / hematuria.  Recent CBC, CMP normal.  PMH sig for ortho surgeries, Right inguinal hernia repair with mesh, GERD, arthritis, history of melanoma of his skin, hypertension and hypercholesterolemia. No CV disease. No blood thinners.   Today Douglas [name] presents for robotic rt cyst decortication + partial nephrectomy.  Past Medical History  Diagnosis Date  . Renal calculi   . Compression fracture     S/P L-SPINE  . GERD (gastroesophageal reflux disease)   . History of surgical fusion joint     DDD C-SPINE  . Hemoptysis   . Allergic rhinitis   . Osteoarthritis   . Hyperlipidemia   . Encounter for long-term (current) use of other medications   . COPD (chronic obstructive pulmonary disease)     FVC .66  . Tobacco use disorder   . Melanoma in situ   . Unspecified vitamin D deficiency   . Leiomyoma OF SKIN    INCREASED RISK RENAL CELL CA  NEEDS CT OF KIDNEYS EVERY 2 YEARS NEXT DUE  02/06/13  . Hypertension     Past Surgical History  Procedure Date  . Anterior cruciate ligament repair     BILATERAL  . Cervicalfusion     with bone graft from right hip  . Hernia repair   . Hand ligament reconstruction     right hand  . Knee surgery     bilateral-(R)1974,(L)1975    Family History  Problem Relation Age of Onset  . Adopted: Yes   Social History:  reports that he has been smoking Cigarettes.  He has a 17.5 pack-year smoking  history. He uses smokeless tobacco. He reports that he drinks about 12.6 ounces of alcohol per week. He reports that he does not use illicit drugs.  Allergies:  Allergies  Allergen Reactions  . Avelox (Moxifloxacin Hcl In Nacl) Hives, Shortness Of Breath and Swelling    No prescriptions prior to admission    No results found for this or any previous visit (from the past 48 hour(s)). No results found.  Review of Systems  Constitutional: Negative.  Negative for fever and chills.  HENT: Negative.   Eyes: Negative.   Respiratory: Negative.   Cardiovascular: Negative.   Gastrointestinal: Negative.   Genitourinary: Negative.   Musculoskeletal: Negative.   Skin: Negative.   Neurological: Negative.   Endo/Heme/Allergies: Negative.  Does not bruise/bleed easily.  Psychiatric/Behavioral: Negative.     There were no vitals taken for this visit. Physical Exam  Constitutional: He is oriented to person, place, and time. He appears well-developed and well-nourished.  HENT:  Head: Normocephalic and atraumatic.  Eyes: EOM are normal. Pupils are equal, round, and reactive to light.  Neck: Normal range of motion. Neck supple.  Cardiovascular: Normal rate and regular rhythm.   Respiratory: Effort normal and breath sounds normal.  GI: Soft. Bowel sounds are normal.       Rt abdominal fullness c/w large cyst.  Genitourinary: Penis normal.  Musculoskeletal:  Normal range of motion.  Neurological: He is alert and oriented to person, place, and time.  Skin: Skin is warm and dry.  Psychiatric: He has a normal mood and affect. His behavior is normal. Judgment and thought content normal.     Assessment/Plan  1 -Expanding Right Renal Cyst with Enhancing Nodule -   Although mass predominantly simple cyst, its expanding size and enhanding nodule are worrisome. I specifically adressed the role of surgery, with preferred approach being robotic cyst decortication with separate partial nephrectomy /  excision of worrisome nodule as my recomended treatment v. continued surveillance.    He wants to proceed with surgery.  We the re-iterated role of partial nephrectomy and cyst excision with the overall goals being a balance of trying to achieve complete surgical excision (negative margins) while minimizing loss of normally functioning kidney. We then discussed surgical approaches including robotic and open techniques with robotic associated with a shorter convalescence. I showed the patient on their abdomen the approximately 4-6 incision (trocar) sites as well as presumed extraction sites with robotic approach as well as possible open incision sites. We specifically addressed that there may be need to alter operative plans according to intraopertive findings including conversion to open procedure or conversion to radical nephrectomy as well as need for adjunctive procedures such as ureteral stenting to promote correct renal healing. We discussed specific peri-operative risks including bleeding, infection, deep vein thrombosis, pulmonary embolism, compartment syndrome, neuropathy / neuropraxia, heart attack, stroke, death, as well as long-term risks such as non-cure / need for additional therapy and need for imaging and lab based post-op surveillance protocols. We discussed typical hospital course of approximately 2 day hospitalization, need for peri-operative drains / catheters, and typical post-hospital course with return to most non-strenuous activities by 2 weeks and ability to return to most jobs and more strenuous activity such as exercise by 6 weeks.  Charene Mccallister 05/22/2012, 6:43 AM

## 2012-05-22 NOTE — Anesthesia Postprocedure Evaluation (Signed)
Anesthesia Post Note  Patient: Douglas [name]  Procedure(s) Performed: Procedure(s) (LRB): ROBOTIC ASSITED PARTIAL NEPHRECTOMY (Right)  Anesthesia type: General  Patient location: PACU  Post pain: Pain level controlled  Post assessment: Post-op Vital signs reviewed  Last Vitals: BP 140/87  Pulse 88  Temp 36.7 C  Resp 14  SpO2 98%  Post vital signs: Reviewed  Level of consciousness: sedated  Complications: No apparent anesthesia complications

## 2012-05-22 NOTE — Progress Notes (Signed)
Result of H&H called to Dr. Sherron Monday; OK to transfer pt to room

## 2012-05-22 NOTE — Anesthesia Preprocedure Evaluation (Addendum)
Anesthesia Evaluation  Patient identified by MRN, date of birth, ID band Patient awake    Reviewed: Allergy & Precautions, H&P , NPO status , Patient's Chart, lab work & pertinent test results  Airway Mallampati: I TM Distance: >3 FB Neck ROM: Limited    Dental  (+) Dental Advisory Given and Teeth Intact   Pulmonary COPD COPD inhaler, Current Smoker,  breath sounds clear to auscultation  Pulmonary exam normal       Cardiovascular hypertension, Pt. on medications Rhythm:Regular Rate:Normal     Neuro/Psych    GI/Hepatic Neg liver ROS, GERD-  Medicated,  Endo/Other  negative endocrine ROS  Renal/GU      Musculoskeletal negative musculoskeletal ROS (+)   Abdominal   Peds  Hematology negative hematology ROS (+)   Anesthesia Other Findings   Reproductive/Obstetrics                        Anesthesia Physical Anesthesia Plan  ASA: III  Anesthesia Plan: General   Post-op Pain Management:    Induction: Intravenous  Airway Management Planned: Oral ETT  Additional Equipment:   Intra-op Plan:   Post-operative Plan: Extubation in OR  Informed Consent: I have reviewed the patients History and Physical, chart, labs and discussed the procedure including the risks, benefits and alternatives for the proposed anesthesia with the patient or authorized representative who has indicated his/her understanding and acceptance.   Dental advisory given  Plan Discussed with: CRNA  Anesthesia Plan Comments:         Anesthesia Quick Evaluation

## 2012-05-22 NOTE — Preoperative (Signed)
Beta Blockers   Reason not to administer Beta Blockers:Not Applicable 

## 2012-05-22 NOTE — Progress Notes (Signed)
Dr. Sherron Monday in to check pt; then new dressing applied around JP drain

## 2012-05-22 NOTE — Transfer of Care (Signed)
Immediate Anesthesia Transfer of Care Note  Patient: Douglas [name]  Procedure(s) Performed: Procedure(s) (LRB) with comments: ROBOTIC ASSITED PARTIAL NEPHRECTOMY (Right) - Right Robotic Cyst Decortication and Partial Nephrectomy   Patient Location: PACU  Anesthesia Type:General  Level of Consciousness: awake, alert , oriented and patient cooperative  Airway & Oxygen Therapy: Patient Spontanous Breathing and Patient connected to face mask oxygen  Post-op Assessment: Report given to PACU RN and Post -op Vital signs reviewed and stable  Post vital signs: Reviewed and stable  Complications: No apparent anesthesia complications

## 2012-05-23 ENCOUNTER — Encounter (HOSPITAL_COMMUNITY): Payer: Self-pay | Admitting: Urology

## 2012-05-23 LAB — BASIC METABOLIC PANEL
BUN: 11 mg/dL (ref 6–23)
CO2: 24 mEq/L (ref 19–32)
Calcium: 9.2 mg/dL (ref 8.4–10.5)
Chloride: 101 mEq/L (ref 96–112)
Creatinine, Ser: 0.92 mg/dL (ref 0.50–1.35)
Glucose, Bld: 123 mg/dL — ABNORMAL HIGH (ref 70–99)

## 2012-05-23 LAB — HEMOGLOBIN AND HEMATOCRIT, BLOOD: Hemoglobin: 14.3 g/dL (ref 13.0–17.0)

## 2012-05-23 MED ORDER — DIPHENHYDRAMINE HCL 50 MG PO CAPS
50.0000 mg | ORAL_CAPSULE | Freq: Every day | ORAL | Status: DC
  Administered 2012-05-23: 50 mg via ORAL
  Filled 2012-05-23 (×2): qty 1

## 2012-05-23 NOTE — Progress Notes (Signed)
1 Day Post-Op  Subjective: 1 - Rt Renal Cyst and Enhancing Nodule - POD1 s/p right robotic cyst decortication and partial nephrectomy. Pt with abd soreness as expected. NO n/v. JP output minimal. Hgb and Cr stable.  Objective: Vital signs in last 24 hours: Temp:  [97.2 F (36.2 C)-98.7 F (37.1 C)] 98.3 F (36.8 C) (12/05 0618) Pulse Rate:  [66-92] 66  (12/05 0618) Resp:  [12-20] 18  (12/05 0618) BP: (118-162)/(75-104) 121/81 mmHg (12/05 0618) SpO2:  [97 %-100 %] 97 % (12/05 0618) Weight:  [86.183 kg (190 lb)] 86.183 kg (190 lb) (12/04 1700) Last BM Date: 05/21/12  Intake/Output from previous day: 12/04 0701 - 12/05 0700 In: 3260 [I.V.:3020; IV Piggyback:200] Out: 1805 [Urine:1380; Blood:25] Intake/Output this shift: Total I/O In: 1055 [I.V.:835; Other:20; IV Piggyback:200] Out: 1200 [Urine:1200]  General appearance: alert, cooperative and appears stated age Head: Normocephalic, without obvious abnormality, atraumatic Eyes: conjunctivae/corneas clear. PERRL, EOM's intact. Fundi benign. Ears: normal TM's and external ear canals both ears Nose: Nares normal. Septum midline. Mucosa normal. No drainage or sinus tenderness. Throat: lips, mucosa, and tongue normal; teeth and gums normal Neck: no adenopathy, no carotid bruit, no JVD, supple, symmetrical, trachea midline and thyroid not enlarged, symmetric, no tenderness/mass/nodules Back: symmetric, no curvature. ROM normal. No CVA tenderness. Resp: clear to auscultation bilaterally Chest wall: no tenderness Cardio: regular rate and rhythm, S1, S2 normal, no murmur, click, rub or gallop GI: soft, non-tender; bowel sounds normal; no masses,  no organomegaly Male genitalia: normal, Foley removed Extremities: extremities normal, atraumatic, no cyanosis or edema Pulses: 2+ and symmetric Skin: Skin color, texture, turgor normal. No rashes or lesions Lymph nodes: Cervical, supraclavicular, and axillary nodes normal. Neurologic: Grossly  normal Incision/Wound: Incision sites c/d/i. RLQ JP with minimal serosanguinous output.   Lab Results:   Basename 05/23/12 0455 05/22/12 1927  WBC -- --  HGB 14.3 14.7  HCT 40.8 41.3  PLT -- --   BMET  Basename 05/23/12 0455 05/22/12 1522  NA 136 139  K 4.2 4.1  CL 101 102  CO2 24 26  GLUCOSE 123* 130*  BUN 11 13  CREATININE 0.92 1.23  CALCIUM 9.2 9.4   PT/INR No results found for this basename: LABPROT:2,INR:2 in the last 72 hours ABG No results found for this basename: PHART:2,PCO2:2,PO2:2,HCO3:2 in the last 72 hours  Studies/Results: No results found.  Anti-infectives: Anti-infectives     Start     Dose/Rate Route Frequency Ordered Stop   05/22/12 0957   ceFAZolin (ANCEF) IVPB 2 g/50 mL premix        2 g 100 mL/hr over 30 Minutes Intravenous 30 min pre-op 05/22/12 0957 05/22/12 1230          Assessment/Plan: 1 - Rt Renal Cyst and Enhancing Nodule - Doing well POD 1.  - DC Foley, JP to stay for now - Ambulate -Regular Diet - Encouraged PO pain meds to mimic home regimen - Possible DC later today v. Tomorrow.  [facility], Jacquelina Hewins 05/23/2012

## 2012-05-23 NOTE — Telephone Encounter (Signed)
Patient having surgery tomorrow.

## 2012-05-23 NOTE — Op Note (Signed)
NAME:  Douglas [name], Douglas [name] NO.:  [id number]  MEDICAL RECORD NO.:  [id number]  LOCATION:  1421                         FACILITY:  [facility]  PHYSICIAN:  Douglas Ache, MD     DATE OF BIRTH:  [birth year]  DATE OF PROCEDURE:  05/22/2012 DATE OF DISCHARGE:                              OPERATIVE REPORT   DIAGNOSIS: 1. Large right renal cyst. 2. Right renal mass.  PROCEDURE: 1. Robotic-assisted laparoscopic right renal cyst decortication. 2. Right robotic partial nephrectomy.  ASSISTANT:  Douglas Leisure, PA.  SPECIMEN: 1. Right renal cyst wall. 2. Right renal cyst wall nodule/renal mass.  FINDINGS: 1. Predominantly simple-appearing right renal cyst, containing 400 mL     of rust-colored fluid. 2. Single nodule of the cyst wall and renal parenchymal border, this     was completely excised. 3. Significant adhesions of the perirenal fat and ascending colonic mesentery to     the medial border of the cyst on the right kidney.  DRAINS: 1. Jackson-Pratt drain to bulb suction right lower quadrant. 2. Foley catheter straight drain.  COMPLICATIONS:  None.  INDICATION:  Douglas [name] is a pleasant 56 year old gentleman who is found on work-up of  right flank pain to have a large right renal cyst.  He underwent initial observation, however, the cyst increased in size in complexity, went from 8-11 cm and a nodule, that was somewhat enhancing, that appeared to be involved with the cyst wall and inferior lip of the kidney border.  Options were discussed for management including percutaneous aspiration, active surveillance, radical nephrectomy or variations of partial nephrectomy and cyst decortication with and without minimally invasive assistance.  The patient elected to proceed with robotic-assisted decortication with partial nephrectomy.  Informed consent was obtained and placed in medical record.  PROCEDURE IN DETAIL:  The patient being Douglas [name], was verified  and the procedure being right robotic-assisted cyst decortication, partial nephrectomy was confirmed.  Procedure was carried out.  Time-out was performed.  Intravenous antibiotics administered.  General LMA anesthesia was introduced.  The patient was placed into a right side up with full flank position applying the superior arm elevator axillary roll.  Kidney rest, sequential compression devices, and bean bag apparatus and the patient's left shoulder was very carefully padded and placed into position which he felt comfortable with before anesthesia due to prior left shoulder problems.  Sterile field was created by prepping and draping the patient's right flank and entire abdomen using chlorhexidine gluconate x3.  Next a high-flow low pressure pneumoperitoneum was obtained using Veress technique in the right lower quadrant, having passed the aspiration and drop test.  A 12-mm robotic camera port was then placed 3 fingerbreadths to the right of midline, midway between the xiphoid and the umbilicus.  Camera was then placed in the peritoneal cavity revealed no significant adhesions, no visceral injury.  A large retroperitoneal cyst was clearly seen and additional port was then placed as follows; a right subcostal 8-mm robotic port, right far lateral 8-mm robotic port, 2 fingerbreadths superomedial to the anterior iliac spine, a right paramedian inferior 8- mm robotic port, and a 12-mm assistant port in the midline just below the umbilicus.  Robot was  docked and passed its electronic checks.  Incision was made lateral to the ascending colon from the area of the hepatic flexure inferiorly towards the cecum, this was carefully mobilized medially.  The duodenum was encountered and carefully Kocherized medially as well the anterior surface of the kidney, cyst wall.  There was significant and very dense fatty adhesions to the anterior border of the cyst wall and the colonic mesentery and these were  very carefully taken down sharply.  The lateral edges of the cyst attachments were also taken down.  The cyst was then entered on the  lateral aspect away from where the presumed nodular area was and immediately 400 mL of rust-colored fluid were suctioned.  The cyst was then completely decorticated excising approximately 80% of its volume, remaining 20% being directly opposed to the kidney, directly apposed portion was fulgurated using coagulation current.  On the medial border of the kidney and cyst wall indeed nodular mass was found.  The cyst wall was excised in such fashion with a wide margin around this and the cyst wall specimen was set aside for permanent pathology. Attention was then directed to partial nephrectomy of this nodular mass. As mass appeared somewhat small less than 2 cm at the very distal aspect of the lip of the kidney, it was felt that no ischemia was warranted as such coagulation current scissorts were used to free up this nodule from the inferomedial lip of the kidney which what appeared to be grossly normal parenchymal margin, this was set aside as partial nephrectomy specimen. Following this,  hemostasis appeared excellent.  All sponge and needle counts were correct.  There were significant adhesions to the medial aspect of the cyst, so by the identification of the ureter was warranted.  The ureter was seen coursing over the anteromedial psoas muscle.  It was seen from the area of the UPJ towards the area of the iliac vessels and was completely intact and non-injured.  Closed suction drain was brought through the right lateral most port site.  Robot was then undocked.  Specimens retrieved by bringing the endocatch bags out of the previous 12 mm assistant port site in their entirety setting aside for permanent pathology.  All 12-mm port sites were closed at the level of fascia using figure-of-eight, 0 Vicryl, 40 mL of 50:50 mix of Lipolized Marcain was then  infiltrated into all incisions.  The skin was reapproximated using subcuticular Monocryl followed by Dermabond. Drain stitch was applied.  Procedure was then terminated.  The patient tolerated the procedure well.  There were no immediate periprocedural complications.  The patient was taken to postanesthesia care unit in stable condition.          ______________________________ Douglas Ache, MD     TM/MEDQ  D:  05/22/2012  T:  05/23/2012  Job:  [id number]

## 2012-05-23 NOTE — Care Management Note (Signed)
    Page 1 of 1   05/24/2012     11:42:19 AM   CARE MANAGEMENT NOTE 05/24/2012  Patient:  Douglas [name], Douglas [name]   Account Number:  [id number]  Date Initiated:  05/23/2012  Documentation initiated by:  Lanier Clam  Subjective/Objective Assessment:   ADMITTED W/R RENAL MASS.     Action/Plan:   FROM HOME   Anticipated DC Date:  05/24/2012   Anticipated DC Plan:  HOME/SELF CARE      DC Planning Services  CM consult      Choice offered to / List presented to:             Status of service:  Completed, signed off Medicare Important Message given?   (If response is "NO", the following Medicare IM given date fields will be blank) Date Medicare IM given:   Date Additional Medicare IM given:    Discharge Disposition:  HOME/SELF CARE  Per UR Regulation:  Reviewed for med. necessity/level of care/duration of stay  If discussed at Long Length of Stay Meetings, dates discussed:    Comments:  05/23/12 Areej Tayler RN,BSN NCM [phone] POD#1 R PARTIAL NEPHRECTOMY.

## 2012-05-24 MED ORDER — SENNA-DOCUSATE SODIUM 8.6-50 MG PO TABS: 1.0000 | ORAL_TABLET | Freq: Every day | ORAL | Status: DC

## 2012-05-24 MED ORDER — OXYCODONE-ACETAMINOPHEN 5-325 MG PO TABS
1.0000 | ORAL_TABLET | ORAL | Status: DC | PRN
Start: 2012-05-24 — End: 2012-09-10

## 2012-05-24 NOTE — Discharge Summary (Signed)
Physician Discharge Summary  Patient ID: Douglas [name] MRN: [id number] DOB/AGE: [birth year] 56 y.o.  Admit date: 05/22/2012 Discharge date: 05/24/2012  Admission Diagnoses: Rt Renal Cyst + Mass  Discharge Diagnoses:  Rt Renal Cyst + Mass   Discharged Condition: good  Hospital Course:   Pt underwent robotic rt renal cyst decortication and partial nephrectomy on the day of admission without acute complications. He was admitted to the 4th floor Urology post-op where he began his vigorous recovery. His Hgb post-op remained stable. His foley was removed POD1 and he voided well. His JP was also removed POD1 as the output had been minimal and serous. By POD2, the day of discharge, the patient was ambulatory, tolerating a diet, and pain controlled on PO meds. He was felt adequate for discharge.   Path pending at time of DC.   Consults: None  Significant Diagnostic Studies: labs: Hgb, Cr both stable pre/post-op.  Treatments: surgery: robotic rt renal cyst decortication and partial nephrectomy  Discharge Exam: Blood pressure 130/74, pulse 73, temperature 98.7 F (37.1 C), temperature source Oral, resp. rate 16, height 6\' 2"  (1.88 m), weight 86.183 kg (190 lb), SpO2 97.00%. General appearance: alert, cooperative and appears stated age Head: Normocephalic, without obvious abnormality, atraumatic Eyes: conjunctivae/corneas clear. PERRL, EOM's intact. Fundi benign. Ears: normal TM's and external ear canals both ears Nose: Nares normal. Septum midline. Mucosa normal. No drainage or sinus tenderness. Throat: lips, mucosa, and tongue normal; teeth and gums normal Neck: no adenopathy, no carotid bruit, no JVD, supple, symmetrical, trachea midline and thyroid not enlarged, symmetric, no tenderness/mass/nodules Back: symmetric, no curvature. ROM normal. No CVA tenderness. Resp: clear to auscultation bilaterally Chest wall: no tenderness Cardio: regular rate and rhythm, S1, S2 normal, no murmur,  click, rub or gallop GI: Recent port sites and extraction sties c/d/i. No pus, No swelling. Prior JP site c/d/i. Male genitalia: normal Extremities: extremities normal, atraumatic, no cyanosis or edema Pulses: 2+ and symmetric Skin: Skin color, texture, turgor normal. No rashes or lesions Lymph nodes: Cervical, supraclavicular, and axillary nodes normal. Neurologic: Grossly normal Incision/Wound:  Disposition: Final discharge disposition not confirmed  Discharge Orders    Future Appointments: Provider: Department: Dept Phone: Center:   08/20/2012 7:45 AM Collene Gobble, MD [facility] [phone]       Medication List     As of 05/24/2012  7:19 AM    STOP taking these medications         HYDROcodone-acetaminophen 7.5-750 MG per tablet   Commonly known as: VICODIN ES      TAKE these medications         atorvastatin 10 MG tablet   Commonly known as: LIPITOR   Take 1 tablet (10 mg total) by mouth at bedtime.      b complex vitamins tablet   Take 1 tablet by mouth daily.      cholecalciferol 1000 UNITS tablet   Commonly known as: VITAMIN D   Take 1,000 Units by mouth daily.      fluticasone 50 MCG/ACT nasal spray   Commonly known as: FLONASE   Place 2 sprays into the nose at bedtime.      losartan 50 MG tablet   Commonly known as: COZAAR   Take 50 mg by mouth daily before breakfast.      multivitamin tablet   Take 1 tablet by mouth daily.      omeprazole 20 MG tablet   Commonly known as: PRILOSEC OTC  Take 20 mg by mouth daily.      oxyCODONE-acetaminophen 5-325 MG per tablet   Commonly known as: PERCOCET/ROXICET   Take 1-2 tablets by mouth every 4 (four) hours as needed for pain.      phenylephrine 10 MG Tabs   Commonly known as: SUDAFED PE   Take 10 mg by mouth 2 (two) times daily as needed. congestion      sennosides-docusate sodium 8.6-50 MG tablet   Commonly known as: SENOKOT-S   Take 1 tablet by mouth daily.      vitamin C 500 MG  tablet   Commonly known as: ASCORBIC ACID   Take 500 mg by mouth daily.      vitamin E 400 UNIT capsule   Take 400 Units by mouth daily.           Follow-up Information    Follow up with Sebastian Ache, MD. On 06/06/2012. (8:30 AM)    Contact information:   [address] [place] [address] [phone]          Signed: Sebastian Ache 05/24/2012, 7:19 AM

## 2012-06-13 ENCOUNTER — Other Ambulatory Visit: Payer: Self-pay | Admitting: Dermatology

## 2012-06-27 ENCOUNTER — Ambulatory Visit (INDEPENDENT_AMBULATORY_CARE_PROVIDER_SITE_OTHER): Payer: BC Managed Care – PPO | Admitting: Emergency Medicine

## 2012-06-27 VITALS — BP 118/80 | HR 82 | Temp 98.4°F | Resp 18 | Ht 72.5 in | Wt 188.8 lb

## 2012-06-27 DIAGNOSIS — C649 Malignant neoplasm of unspecified kidney, except renal pelvis: Secondary | ICD-10-CM

## 2012-06-27 DIAGNOSIS — M25519 Pain in unspecified shoulder: Secondary | ICD-10-CM

## 2012-06-27 MED ORDER — BACITRACIN 500 UNIT/GM EX OINT: 1.0000 "application " | TOPICAL_OINTMENT | Freq: Two times a day (BID) | CUTANEOUS | Status: DC

## 2012-06-27 MED ORDER — HYDROCODONE-ACETAMINOPHEN 5-325 MG PO TABS: 1.0000 | ORAL_TABLET | Freq: Four times a day (QID) | ORAL | Status: DC | PRN

## 2012-06-27 NOTE — Progress Notes (Signed)
  Subjective:    Patient ID: Douglas [name], male    DOB: [birth year], 57 y.o.   MRN: [id number]  HPI patient here for followup recent surgery for renal cell cancer. He had a renal cell tumor developed in the wall of a cyst. Surgery was successful and the margin of the surgical sites was clear of infection. He is in today for recheck he overall is doing well. He is having significant trouble with his left shoulder. He has a lot of pain when he raises his shoulder. He is seeing Dr. Dion Saucier in the past and had injections with improvement    Review of Systems     Objective:   Physical Exam patient is alert and cooperative and not in distress. His neck is supple. His chest is clear to both auscultation and percussion. The portal sites for his surgery are healing there is some weeping from one of the portal sites right midabdomen. There is significant discomfort with elevation of the left arm.        Assessment & Plan:  The portal sites clean with soap and water. He can apply Bactroban ointment to these areas. I did refill his hydrocodone. I did encourage him to make an appointment to see the orthopedist regarding his shoulder discomfort.

## 2012-07-31 ENCOUNTER — Ambulatory Visit (INDEPENDENT_AMBULATORY_CARE_PROVIDER_SITE_OTHER): Payer: BC Managed Care – PPO | Admitting: Emergency Medicine

## 2012-07-31 VITALS — BP 123/81 | HR 88 | Temp 98.1°F | Resp 16 | Ht 73.25 in | Wt 186.0 lb

## 2012-07-31 DIAGNOSIS — L24A9 Irritant contact dermatitis due friction or contact with other specified body fluids: Secondary | ICD-10-CM

## 2012-07-31 DIAGNOSIS — S31109A Unspecified open wound of abdominal wall, unspecified quadrant without penetration into peritoneal cavity, initial encounter: Secondary | ICD-10-CM

## 2012-07-31 DIAGNOSIS — M25519 Pain in unspecified shoulder: Secondary | ICD-10-CM

## 2012-07-31 DIAGNOSIS — C649 Malignant neoplasm of unspecified kidney, except renal pelvis: Secondary | ICD-10-CM

## 2012-07-31 DIAGNOSIS — T148XXA Other injury of unspecified body region, initial encounter: Secondary | ICD-10-CM

## 2012-07-31 MED ORDER — HYDROCODONE-ACETAMINOPHEN 7.5-325 MG PO TABS: 1.0000 | ORAL_TABLET | Freq: Four times a day (QID) | ORAL | Status: DC | PRN

## 2012-07-31 MED ORDER — MUPIROCIN 2 % EX OINT: TOPICAL_OINTMENT | Freq: Three times a day (TID) | CUTANEOUS | Status: DC

## 2012-07-31 NOTE — Progress Notes (Signed)
  Subjective:    Patient ID: Douglas [name], male    DOB: 08/01/1955, 57 y.o.   MRN: [id number]  HPI patient in for recheck. He is 10 weeks out from surgery for renal cell cancer. Psychologically he stresses over this a lot and is worried about recurrence of his cancer in the future. He is also concerned about a continued draining site right lower abdomen. He sees Dr. Dion Saucier for his chronic shoulder pain.    Review of Systems     Objective:   Physical Exam looks to be some atrophy around the left deltoid area. He is unable to abduct. His chest is clear his cardiac exam is unremarkable the portal sites on the abdomen continued to be slightly red the portal site on the right lower abdomen is also draining some.        Assessment & Plan:  Wound culture was done of the lesion right lower abdomen. He's going to be going to a support group at [facility] if this does not work out will refer him to [facility]. I have refilled his pain medications. Last time he had a 5 mg hydrocodone he actually needs to 7.5 hydrocodone.

## 2012-08-02 LAB — WOUND CULTURE
Gram Stain: NONE SEEN
Gram Stain: NONE SEEN
Gram Stain: NONE SEEN
Organism ID, Bacteria: NO GROWTH

## 2012-08-03 ENCOUNTER — Other Ambulatory Visit: Payer: Self-pay

## 2012-08-20 ENCOUNTER — Other Ambulatory Visit: Payer: Self-pay | Admitting: Emergency Medicine

## 2012-08-20 ENCOUNTER — Ambulatory Visit (INDEPENDENT_AMBULATORY_CARE_PROVIDER_SITE_OTHER): Payer: BC Managed Care – PPO

## 2012-08-20 ENCOUNTER — Ambulatory Visit (INDEPENDENT_AMBULATORY_CARE_PROVIDER_SITE_OTHER): Payer: BC Managed Care – PPO | Admitting: Emergency Medicine

## 2012-08-20 ENCOUNTER — Encounter: Payer: Self-pay | Admitting: Emergency Medicine

## 2012-08-20 VITALS — BP 108/76 | HR 109 | Temp 98.2°F | Resp 16 | Ht 72.25 in | Wt 189.0 lb

## 2012-08-20 DIAGNOSIS — S31109A Unspecified open wound of abdominal wall, unspecified quadrant without penetration into peritoneal cavity, initial encounter: Secondary | ICD-10-CM

## 2012-08-20 DIAGNOSIS — F439 Reaction to severe stress, unspecified: Secondary | ICD-10-CM

## 2012-08-20 DIAGNOSIS — C641 Malignant neoplasm of right kidney, except renal pelvis: Secondary | ICD-10-CM

## 2012-08-20 DIAGNOSIS — C649 Malignant neoplasm of unspecified kidney, except renal pelvis: Secondary | ICD-10-CM

## 2012-08-20 DIAGNOSIS — T8189XS Other complications of procedures, not elsewhere classified, sequela: Secondary | ICD-10-CM

## 2012-08-20 DIAGNOSIS — Z1509 Genetic susceptibility to other malignant neoplasm: Secondary | ICD-10-CM | POA: Insufficient documentation

## 2012-08-20 DIAGNOSIS — Z733 Stress, not elsewhere classified: Secondary | ICD-10-CM

## 2012-08-20 NOTE — Progress Notes (Signed)
  Subjective:    Patient ID: Douglas [name], male    DOB: 12-Jul-1955, 57 y.o.   MRN: [id number]  HPI this patient has been diagnosed with a genetic disorder predisposing him to renal cell cancer. This was diagnosed after removal of 2 angiolipoma's by Dr. Karlyn [name]. He had genetic counseling at [facility] and has yearly followup scans of his kidneys. Last year he was found to have a cystic lesion with a small less than 1 cm appearing nodule in the wall of the cyst. Subsequently in December he had surgery which revealed this to be a type II renal cell carcinoma. He discussed this with Dr. Sharlene Motts who is a geneticist at [facility] who was recommended he undergo extensive workup for metastatic disease in that he carries the HLRCC mutation.   Patient is very concerned and he already has metastatic disease. In the research that he has done lesions greater than 1 cm apparently are known to already metastasized.   Review of Systems     Objective:   Physical Exam physical exam reveals a 1 x 1 cm open draining area right side of abdomen. There are no other masses felt .        Assessment & Plan:  Patient is a scheduled to be seen by the oncologist at [facility] in 10 days. He is scheduled to have scans of the head, chest ,and abdomen. I called Douglas [name] to see if we can get him started with counseling. I put a call into Dr. Stevie [name] just to be sure that they have all the information they need. I put in a call to  Dr. Berneice [name] to talk with him and I made a referral to Douglas [name] for a lesion right side of the abdomen which is nonhealing . A culture was done of this lesion. Douglas [name] said he did not want to take medication at the present time for stress.

## 2012-08-22 ENCOUNTER — Telehealth: Payer: Self-pay | Admitting: Radiology

## 2012-08-22 ENCOUNTER — Other Ambulatory Visit: Payer: Self-pay | Admitting: Dermatology

## 2012-08-22 LAB — WOUND CULTURE: Organism ID, Bacteria: NO GROWTH

## 2012-08-22 NOTE — Telephone Encounter (Signed)
I have spoken with the nurse practitioner and relayed as much information as I had regarding his imaging studies.

## 2012-08-22 NOTE — Telephone Encounter (Signed)
[facility] Dr Cleta Alberts wants to speak to Barkley Bruns the RN who will see this patient at [facility]. Dr Max Fickle number may be [phone] (this is the main #). I called and this patient will be seen in radiology, a different number given is [phone] I was transferred and her pager # [phone] I have paged her to call you on your cell/ if you do not hear from her please let me know we can call again.

## 2012-08-22 NOTE — Telephone Encounter (Signed)
Called Dr Kathrynn Running office at [facility] urology to have him call you back on your cell #. It will be Monday before you hear back from him.

## 2012-08-22 NOTE — Telephone Encounter (Signed)
Called again, since they have not called, Dr Cleta Alberts has spoken to the nurse. Amy

## 2012-09-10 ENCOUNTER — Ambulatory Visit (INDEPENDENT_AMBULATORY_CARE_PROVIDER_SITE_OTHER): Payer: BC Managed Care – PPO | Admitting: Emergency Medicine

## 2012-09-10 VITALS — BP 118/82 | HR 72 | Temp 98.0°F | Resp 16 | Ht 73.0 in | Wt 191.0 lb

## 2012-09-10 DIAGNOSIS — L929 Granulomatous disorder of the skin and subcutaneous tissue, unspecified: Secondary | ICD-10-CM

## 2012-09-10 DIAGNOSIS — D235 Other benign neoplasm of skin of trunk: Secondary | ICD-10-CM

## 2012-09-10 DIAGNOSIS — M25519 Pain in unspecified shoulder: Secondary | ICD-10-CM

## 2012-09-10 DIAGNOSIS — C649 Malignant neoplasm of unspecified kidney, except renal pelvis: Secondary | ICD-10-CM

## 2012-09-10 NOTE — Progress Notes (Signed)
  Subjective:    Patient ID: Douglas [name], male    DOB: 06/30/1955, 57 y.o.   MRN: [id number]  HPI patient is to followup on his papillary renal cell cancer. His appointment at [facility] did not disclose any evidence of metastatic disease. He's currently on no further treatment for this disorder. He is scheduled to see Dr. Berneice Heinrich this summer. He is feeling well he is no longer depressed he continues to deal with a lesion on his abdomen at the site of his surgery.      Review of Systems     Objective:   Physical Exam patient is alert and cooperative. Chest is clear. Abdomen is soft there is a 1.5 x 0.5 cm raised red area at the portal site right upper abdomen        Assessment & Plan:  He is going to see Dr. Karlyn Agee for evaluation of the site and consideration for steroid injections

## 2012-10-09 ENCOUNTER — Ambulatory Visit (INDEPENDENT_AMBULATORY_CARE_PROVIDER_SITE_OTHER): Payer: BC Managed Care – PPO | Admitting: Emergency Medicine

## 2012-10-09 VITALS — BP 124/76 | HR 80 | Temp 98.7°F | Resp 16 | Ht 72.5 in | Wt 186.8 lb

## 2012-10-09 DIAGNOSIS — M25519 Pain in unspecified shoulder: Secondary | ICD-10-CM

## 2012-10-09 DIAGNOSIS — M542 Cervicalgia: Secondary | ICD-10-CM

## 2012-10-09 DIAGNOSIS — Z5189 Encounter for other specified aftercare: Secondary | ICD-10-CM

## 2012-10-09 DIAGNOSIS — IMO0001 Reserved for inherently not codable concepts without codable children: Secondary | ICD-10-CM

## 2012-10-09 DIAGNOSIS — M25512 Pain in left shoulder: Secondary | ICD-10-CM

## 2012-10-09 MED ORDER — HYDROCODONE-ACETAMINOPHEN 7.5-325 MG PO TABS: 1.0000 | ORAL_TABLET | Freq: Four times a day (QID) | ORAL | Status: DC | PRN

## 2012-10-09 MED ORDER — DOXYCYCLINE HYCLATE 100 MG PO CAPS: 100.0000 mg | ORAL_CAPSULE | Freq: Two times a day (BID) | ORAL | Status: DC

## 2012-10-09 NOTE — Progress Notes (Signed)
  Subjective:    Patient ID: Douglas [name], male    DOB: [birth year], 57 y.o.   MRN: [id number]  HPI 57 yo male who had surgery for kidney cancer in December. Now having draining from the site where the drainage tube was placed. First noticed the drainage 5 days ago. Squeezed it and got a tablespoon of yellow pus. Has continued to squeeze it and is able to express more purulence.  Saw dermatologist last week who said this spot was healing well, although there is a lump underneath the lesion that is questionable. Has had cultures and biopsies which have revealed nothing.  Also reports he had an illness last week. Had been coughing until he vomits. Had fever until this weekend.    Review of Systems  Constitutional: Positive for fever (Last week). Negative for chills.  HENT: Positive for congestion and postnasal drip.   Respiratory: Positive for cough.        Objective:   Physical Exam  Constitutional: He is oriented to person, place, and time. He appears well-developed and well-nourished.  HENT:  Head: Normocephalic and atraumatic.  Cardiovascular: Normal rate, regular rhythm and normal heart sounds.   Pulmonary/Chest: Effort normal and breath sounds normal.  Neurological: He is alert and oriented to person, place, and time.  Skin:     Small 1cm ucler with small erythematous ring around it. Expressed yellow purulence.           Assessment & Plan:  Pain meds were refilled today. We'll treat with doxycycline 100 twice a day. Culture was done of the purulent drainage. Call was placed to arrange for a dermatologist and they will see the patient next week when Dr. Yetta Barre returns. Call was placed to Dr. Berneice Heinrich  and he is to return my call in 3 and we'll get Mr. sink one in to see him to evaluate the surgical site.

## 2012-10-10 ENCOUNTER — Encounter: Payer: Self-pay | Admitting: Family Medicine

## 2012-10-11 LAB — WOUND CULTURE
Gram Stain: NONE SEEN
Organism ID, Bacteria: NO GROWTH

## 2012-10-14 ENCOUNTER — Encounter: Payer: Self-pay | Admitting: Radiology

## 2012-10-17 ENCOUNTER — Other Ambulatory Visit: Payer: Self-pay | Admitting: Dermatology

## 2013-01-06 ENCOUNTER — Ambulatory Visit (INDEPENDENT_AMBULATORY_CARE_PROVIDER_SITE_OTHER): Payer: BC Managed Care – PPO | Admitting: Emergency Medicine

## 2013-01-06 DIAGNOSIS — M25519 Pain in unspecified shoulder: Secondary | ICD-10-CM

## 2013-01-06 DIAGNOSIS — L03116 Cellulitis of left lower limb: Secondary | ICD-10-CM

## 2013-01-06 DIAGNOSIS — L24A9 Irritant contact dermatitis due friction or contact with other specified body fluids: Secondary | ICD-10-CM

## 2013-01-06 DIAGNOSIS — L02419 Cutaneous abscess of limb, unspecified: Secondary | ICD-10-CM

## 2013-01-06 DIAGNOSIS — C641 Malignant neoplasm of right kidney, except renal pelvis: Secondary | ICD-10-CM

## 2013-01-06 DIAGNOSIS — C649 Malignant neoplasm of unspecified kidney, except renal pelvis: Secondary | ICD-10-CM

## 2013-01-06 DIAGNOSIS — T148XXA Other injury of unspecified body region, initial encounter: Secondary | ICD-10-CM

## 2013-01-06 DIAGNOSIS — M25512 Pain in left shoulder: Secondary | ICD-10-CM

## 2013-01-06 DIAGNOSIS — L03119 Cellulitis of unspecified part of limb: Secondary | ICD-10-CM

## 2013-01-06 LAB — POCT UA - MICROSCOPIC ONLY
Bacteria, U Microscopic: NEGATIVE
Casts, Ur, LPF, POC: NEGATIVE
Crystals, Ur, HPF, POC: NEGATIVE
Yeast, UA: NEGATIVE

## 2013-01-06 LAB — POCT URINALYSIS DIPSTICK
Protein, UA: NEGATIVE
Spec Grav, UA: 1.015
Urobilinogen, UA: 0.2

## 2013-01-06 MED ORDER — HYDROCODONE-ACETAMINOPHEN 7.5-325 MG PO TABS: 1.0000 | ORAL_TABLET | Freq: Four times a day (QID) | ORAL | Status: DC | PRN

## 2013-01-06 MED ORDER — MUPIROCIN 2 % EX OINT: TOPICAL_OINTMENT | Freq: Three times a day (TID) | CUTANEOUS | Status: DC

## 2013-01-06 NOTE — Progress Notes (Signed)
  Subjective:    Patient ID: Douglas [name], male    DOB: [birth year], 57 y.o.   MRN: [id number]  HPI patient enters for refill of his pain medication. He has persistent pain in his neck and down into his left shoulder. He also has noticed an unusual odor to his urine. He is concerned about a bite-like area on the inside of his left leg. He was hiking last week at [facility] but does not know of any tick exposure. He did not see a bite. He also has an irritated area on his right arm    Review of Systems     Objective:   Physical Exam patient has decreased range of motion of the left shoulder. He is unable to abduct his arm past 45. He has decreased range of motion of his neck. His chest is clear the abdomen is soft the portal site on the right side of his abdomen is healed. There is a 1 x 2 cm bite-like area on the inside of his left thigh  Results for orders placed in visit on 01/06/13  POCT UA - MICROSCOPIC ONLY      Result Value Range   WBC, Ur, HPF, POC neg     RBC, urine, microscopic 1-2     Bacteria, U Microscopic neg     Mucus, UA neg     Epithelial cells, urine per micros neg     Crystals, Ur, HPF, POC neg     Casts, Ur, LPF, POC neg     Yeast, UA neg    POCT URINALYSIS DIPSTICK      Result Value Range   Color, UA yellow     Clarity, UA clear     Glucose, UA neg     Bilirubin, UA neg     Ketones, UA neg     Spec Grav, UA 1.015     Blood, UA trace-lysed     pH, UA 6.0     Protein, UA neg     Urobilinogen, UA 0.2     Nitrite, UA neg     Leukocytes, UA Negative          Assessment & Plan:  Pain meds refilled. He is to use Bactroban to the bite on his left leg and to notify me if he gets any symptoms of tick  disease.

## 2013-01-08 LAB — WOUND CULTURE
Gram Stain: NONE SEEN
Gram Stain: NONE SEEN

## 2013-02-21 ENCOUNTER — Other Ambulatory Visit: Payer: Self-pay

## 2013-02-21 MED ORDER — FLUTICASONE PROPIONATE 50 MCG/ACT NA SUSP: 2.0000 | Freq: Every day | NASAL | Status: DC

## 2013-02-21 MED ORDER — ATORVASTATIN CALCIUM 10 MG PO TABS: 10.0000 mg | ORAL_TABLET | Freq: Every day | ORAL | Status: DC

## 2013-02-21 MED ORDER — LOSARTAN POTASSIUM 50 MG PO TABS: 50.0000 mg | ORAL_TABLET | Freq: Every day | ORAL | Status: DC

## 2013-02-21 NOTE — Telephone Encounter (Signed)
Pt has appt sch for CPE on 03/25/13. I will RF losartan fluticasone and atorvastatin through [employer] to cover him until appt (90 day supplies for mail order).

## 2013-03-18 ENCOUNTER — Telehealth: Payer: Self-pay

## 2013-03-18 NOTE — Telephone Encounter (Signed)
DR. Cleta Alberts IS REQUESTING RECORDS FROM [facility] FROM SCANS DONE 03/03/13. [facility] SAYS THE FASTEST WAY TO DO THAT IS FOR OUR OFFICE TO SEND A REQUEST TO THEM AND THERE ARE TWO DIFFERENT OFFICES TO FAX THE REQUEST.  FAX# FOR SCANS DONE ON 03/03/13 [phone] FAX# FOR RECORDS ON 03/03/13 [phone]

## 2013-03-19 NOTE — Telephone Encounter (Signed)
Requests for records faxed to both fax numbers with confirmation. Will wait for records.

## 2013-03-25 ENCOUNTER — Ambulatory Visit (INDEPENDENT_AMBULATORY_CARE_PROVIDER_SITE_OTHER): Payer: BC Managed Care – PPO | Admitting: Emergency Medicine

## 2013-03-25 ENCOUNTER — Encounter: Payer: Self-pay | Admitting: Emergency Medicine

## 2013-03-25 VITALS — HR 74 | Temp 98.8°F | Resp 16 | Ht 72.0 in | Wt 189.9 lb

## 2013-03-25 DIAGNOSIS — M25519 Pain in unspecified shoulder: Secondary | ICD-10-CM

## 2013-03-25 DIAGNOSIS — C649 Malignant neoplasm of unspecified kidney, except renal pelvis: Secondary | ICD-10-CM

## 2013-03-25 DIAGNOSIS — M25512 Pain in left shoulder: Secondary | ICD-10-CM

## 2013-03-25 DIAGNOSIS — Z0000 Encounter for general adult medical examination without abnormal findings: Secondary | ICD-10-CM

## 2013-03-25 DIAGNOSIS — R21 Rash and other nonspecific skin eruption: Secondary | ICD-10-CM

## 2013-03-25 LAB — CBC WITH DIFFERENTIAL/PLATELET
Basophils Absolute: 0 10*3/uL (ref 0.0–0.1)
Basophils Relative: 0 % (ref 0–1)
Eosinophils Absolute: 0.2 10*3/uL (ref 0.0–0.7)
Eosinophils Relative: 2 % (ref 0–5)
HCT: 46.4 % (ref 39.0–52.0)
MCHC: 35.3 g/dL (ref 30.0–36.0)
MCV: 91.5 fL (ref 78.0–100.0)
Monocytes Absolute: 0.4 10*3/uL (ref 0.1–1.0)
RDW: 13.3 % (ref 11.5–15.5)

## 2013-03-25 LAB — LIPID PANEL
HDL: 62 mg/dL (ref >39)
LDL Cholesterol: 72 mg/dL (ref 0–99)
Total CHOL/HDL Ratio: 2.5 Ratio

## 2013-03-25 LAB — POCT SKIN KOH: Skin KOH, POC: NEGATIVE

## 2013-03-25 LAB — COMPREHENSIVE METABOLIC PANEL
AST: 16 U/L (ref 0–37)
Alkaline Phosphatase: 39 U/L (ref 39–117)
BUN: 16 mg/dL (ref 6–23)
Calcium: 10 mg/dL (ref 8.4–10.5)
Creat: 0.98 mg/dL (ref 0.50–1.35)

## 2013-03-25 MED ORDER — HYDROCODONE-ACETAMINOPHEN 7.5-325 MG PO TABS: ORAL_TABLET | ORAL | Status: DC

## 2013-03-25 NOTE — Progress Notes (Signed)
  Subjective:    Patient ID: Douglas [name], male    DOB: [birth year], 57 y.o.   MRN: [id number]  HPI    Review of Systems  Constitutional: Negative.   HENT: Positive for neck pain.   Eyes: Negative.   Respiratory: Negative.   Cardiovascular: Negative.   Gastrointestinal: Negative.   Endocrine: Negative.   Genitourinary: Negative.   Musculoskeletal: Positive for myalgias and back pain.  Skin: Positive for rash.  Allergic/Immunologic: Negative.   Neurological: Negative.   Hematological: Negative.   Psychiatric/Behavioral: Negative.    patient is in for followup of his known genetic mutation which puts him at risk for papillary renal cancer. Apparently on his last CT at [facility] 2 lesions were seen at the surgical site. He has been into see Dr. Berneice Heinrich he is now scheduled to be seen and assessed further evaluation . They will do a PET scan to air. If he needs to have surgery he wants Dr. Berneice Heinrich to be his surgeon .     Objective:   Physical Exam HEENT exam is unremarkable except for limited range of motion of the neck. He also has limited range of motion involving the left shoulder with popping sensation. His chest was clear his heart is regular rate without murmurs abdomen is soft liver and spleen not enlarged no areas of tenderness. Skin exam reveals reddened crusty areas on the right forearm.        Results for orders placed in visit on 03/25/13  POCT SKIN KOH      Result Value Range   Skin KOH, POC Negative     Assessment & Plan:  Patient is abnormal areas on his last CT of the abdomen. Apparently the chest CT was unremarkable. I have requested copies of his reports. [facility]. They will do a PET scan. Recommendations will be made at that time regarding need for further surgery and removal of the kidney. He will discuss with them whether he needs to continue to be seen at [facility] or whether he can see an oncologist here in  [place]. Dr. Berneice Heinrich will be his surgeon. I am sure he will have regular visits to [facility]. Decision to be made on whether he also needs his oncologist in [place]. Contact numbers for [facility] office are Mardene Cel[facility] [address] [place]  [place] [address] [phone].

## 2013-03-26 LAB — PSA: PSA: 1.1 ng/mL (ref <=4.00)

## 2013-04-24 ENCOUNTER — Other Ambulatory Visit: Payer: Self-pay

## 2013-04-29 ENCOUNTER — Encounter: Payer: Self-pay | Admitting: Emergency Medicine

## 2013-04-29 ENCOUNTER — Ambulatory Visit (INDEPENDENT_AMBULATORY_CARE_PROVIDER_SITE_OTHER): Payer: BC Managed Care – PPO | Admitting: Emergency Medicine

## 2013-04-29 VITALS — BP 120/74 | HR 79 | Temp 98.5°F | Resp 16 | Ht 72.0 in | Wt 192.4 lb

## 2013-04-29 DIAGNOSIS — C649 Malignant neoplasm of unspecified kidney, except renal pelvis: Secondary | ICD-10-CM

## 2013-04-29 DIAGNOSIS — C641 Malignant neoplasm of right kidney, except renal pelvis: Secondary | ICD-10-CM

## 2013-04-29 NOTE — Progress Notes (Signed)
  Subjective:    Patient ID: Douglas [name], male    DOB: 02-15-1956, 57 y.o.   MRN: [id number]  HPI patient here to discuss findings of his recent visit to the [facility]. He carries the HL RCC mutation putting him at risk for renal cell cancer. He had surgery for his renal cell cancer but on recent scanning there is a abnormal area noted on scan . He went to the [facility] where a PET scan revealed no liver lesion however there is a suspicious area in the right hip. He does not know anymore about it except that he did have an MRI of the hip a student does not know the results. Plan was to have repeat studies done at the [facility] in 3 months to include a PET scan .    Review of Systems     Objective:   Physical Exam He is alert and cooperative we spent our time discussing his situation.       Assessment & Plan:  A put a call in to talk to Manon Hilding phone number [phone] she is a Financial planner for the oncology division at the [facility]. I asked for her to return my call so I can get more information regarding the MRI he had done of his hip. Once I have this information I will have speech come back in and we will discuss results. He has a followup visit with Dr. Berneice Heinrich next week.

## 2013-04-30 ENCOUNTER — Telehealth: Payer: Self-pay | Admitting: Emergency Medicine

## 2013-04-30 NOTE — Addendum Note (Signed)
Addended by: Lesle Chris A on: 04/30/2013 08:00 AM   Modules accepted: Orders

## 2013-04-30 NOTE — Telephone Encounter (Signed)
Call patient and let him know I am scheduling a CT of the pelvis to be sure the area that was seen on previous CT is stable

## 2013-04-30 NOTE — Telephone Encounter (Signed)
Called him. Left message for him to call me back.  

## 2013-05-01 NOTE — Telephone Encounter (Signed)
[facility] wants this to be ordered as CT abdomen and pelvis/ please advise.

## 2013-05-01 NOTE — Telephone Encounter (Signed)
Go ahead and cancel this order. He is going to followup [facility].

## 2013-05-02 NOTE — Addendum Note (Signed)
Addended byCaffie Damme on: 05/02/2013 08:21 AM   Modules accepted: Orders

## 2013-05-02 NOTE — Telephone Encounter (Signed)
Cancelled order. To Sherlon Handing

## 2013-05-26 ENCOUNTER — Other Ambulatory Visit: Payer: Self-pay | Admitting: Radiology

## 2013-05-26 MED ORDER — FLUTICASONE PROPIONATE 50 MCG/ACT NA SUSP: 2.0000 | Freq: Every day | NASAL | Status: DC

## 2013-05-26 MED ORDER — ATORVASTATIN CALCIUM 10 MG PO TABS: 10.0000 mg | ORAL_TABLET | Freq: Every day | ORAL | Status: DC

## 2013-05-26 MED ORDER — LOSARTAN POTASSIUM 50 MG PO TABS: 50.0000 mg | ORAL_TABLET | Freq: Every day | ORAL | Status: DC

## 2013-06-03 ENCOUNTER — Ambulatory Visit: Payer: BC Managed Care – PPO | Admitting: Emergency Medicine

## 2013-06-05 ENCOUNTER — Ambulatory Visit (INDEPENDENT_AMBULATORY_CARE_PROVIDER_SITE_OTHER): Payer: BC Managed Care – PPO | Admitting: Emergency Medicine

## 2013-06-05 VITALS — BP 124/84 | HR 87 | Temp 98.5°F | Resp 16 | Ht 73.5 in | Wt 194.0 lb

## 2013-06-05 DIAGNOSIS — M542 Cervicalgia: Secondary | ICD-10-CM

## 2013-06-05 DIAGNOSIS — M25512 Pain in left shoulder: Secondary | ICD-10-CM

## 2013-06-05 DIAGNOSIS — M25519 Pain in unspecified shoulder: Secondary | ICD-10-CM

## 2013-06-05 DIAGNOSIS — C649 Malignant neoplasm of unspecified kidney, except renal pelvis: Secondary | ICD-10-CM

## 2013-06-05 MED ORDER — HYDROCODONE-ACETAMINOPHEN 7.5-325 MG PO TABS: ORAL_TABLET | ORAL | Status: DC

## 2013-06-05 NOTE — Progress Notes (Signed)
Subjective:  This chart was scribed for Lesle Chris, MD by Carl Best, Medical Scribe. This patient was seen in Room 4 and the patient's care was started at 8:57 AM.   Patient ID: Douglas [name], male    DOB: [birth year], 57 y.o.   MRN: [id number]  HPI HPI Comments: Douglas [name] is a 57 y.o. male with a history of Hyperlipidemia, GERD, Hypertension, Papillary Renal Cell Cancer caused by a genetic mutation, and Osteoarthritis who presents to the [facility] needing a refill for his pain medication.  The patient is still being followed [facility].  The patient states that his left shoulder pain worsens with the weather and he recently strained it again on Saturday while cleaning gutters.  He states that he believes he will run out of his pain medication by the end of the month.  He states that he is thinking about getting surgery in the next three years.  He states that the right fifth metacarpal pain has progressively gotten better but has migrated to his middle finger.  The patient is also complaining of constant back pain three weeks ago when he pulled a muscle in his back fishing.  He states that his pain medication has helped alleviate his pain.  He states that his back pain has resolved.  He denies needing a refill for any of his other prescriptions.   The patient states that he is doing well but states that he gets more apprehensive the closer it gets to February.  He states that he would not like to take Xanax for his mild anxiety.  The patient states that his wife is stressed out and has used his oxygen at home.  He states that she is currently taking anti-seizure medication and she is worried about the side effects.      Past Medical History  Diagnosis Date  . Renal calculi   . Compression fracture     S/P L-SPINE  . GERD (gastroesophageal reflux disease)   . History of surgical fusion joint     DDD C-SPINE  . Hemoptysis   . Allergic rhinitis   .  Osteoarthritis   . Hyperlipidemia   . Encounter for long-term (current) use of other medications   . COPD (chronic obstructive pulmonary disease)     FVC .66  . Tobacco use disorder   . Melanoma in situ   . Unspecified vitamin D deficiency   . Leiomyoma OF SKIN    INCREASED RISK RENAL CELL CA  NEEDS CT OF KIDNEYS EVERY 2 YEARS NEXT DUE  02/06/13  . Hypertension   . Cancer   . Neuromuscular disorder    Past Surgical History  Procedure Laterality Date  . Anterior cruciate ligament repair      BILATERAL  . Cervicalfusion      with bone graft from right hip  . Hernia repair    . Hand ligament reconstruction      right hand  . Knee surgery      bilateral-(R)1974,(L)1975  . Robotic assited partial nephrectomy  05/22/2012    Procedure: ROBOTIC ASSITED PARTIAL NEPHRECTOMY;  Surgeon: Sebastian Ache, MD;  Location: [facility];  Service: Urology;  Laterality: Right;  Right Robotic Cyst Decortication and Partial Nephrectomy   . Spine surgery     Family History  Problem Relation Age of Onset  . Adopted: Yes   History   Social History  . Marital Status: Married    Spouse Name: N/A  Number of Children: N/A  . Years of Education: N/A   Occupational History  . sales [employer]   Social History Main Topics  . Smoking status: Current Every Day Smoker -- 0.50 packs/day for 35 years    Types: Cigarettes  . Smokeless tobacco: Current User     Comment: Pt chewing Nicorette gum currently  . Alcohol Use: 12.6 oz/week    21 Cans of beer per week     Comment: 3 beers daily  . Drug Use: No  . Sexual Activity: Yes   Other Topics Concern  . Not on file   Social History Narrative   Walks daily for exercise.   Allergies  Allergen Reactions  . Avelox [Moxifloxacin Hcl In Nacl] Hives, Shortness Of Breath and Swelling    Review of Systems  Musculoskeletal: Positive for arthralgias (left shoulder, right middle finger).  All other systems reviewed and are  negative.     Objective:  Physical Exam Physical Exam  Nursing note and vitals reviewed. Constitutional: She is oriented to person, place, and time. She appears well-developed and well-nourished. No distress.  HENT:  Head: Normocephalic and atraumatic.  Eyes: Conjunctivae and EOM are normal. Pupils are equal, round, and reactive to light.  Neck: Neck supple.  Cardiovascular: Normal rate.   Pulmonary/Chest: Effort normal.  Neurological: She is alert and oriented to person, place, and time. No cranial nerve deficit.  Psychiatric: She has a normal mood and affect. Her behavior is normal.  There is tenderness over the lower lumbar spine. Deep tendon reflexes 2+ and symmetrical ankles and the He has decreased range of motion of the left shoulder    Assessment & Plan:  Pain meds were refilled. [facility]. He will see me after that appointment. He is having followup CAT scans and MRIs. He has some anxiety related to this but overall is compensating for this well. I personally performed the services described in this documentation, which was scribed in my presence. The recorded information has been reviewed and is accurate.

## 2013-07-31 ENCOUNTER — Other Ambulatory Visit: Payer: Self-pay | Admitting: Emergency Medicine

## 2013-08-05 ENCOUNTER — Ambulatory Visit: Payer: BC Managed Care – PPO | Admitting: Emergency Medicine

## 2013-08-08 ENCOUNTER — Encounter: Payer: Self-pay | Admitting: Emergency Medicine

## 2013-08-08 ENCOUNTER — Ambulatory Visit (INDEPENDENT_AMBULATORY_CARE_PROVIDER_SITE_OTHER): Payer: BC Managed Care – PPO | Admitting: Emergency Medicine

## 2013-08-08 VITALS — BP 124/72 | HR 88 | Temp 98.6°F | Resp 16 | Ht 72.0 in | Wt 192.0 lb

## 2013-08-08 DIAGNOSIS — C649 Malignant neoplasm of unspecified kidney, except renal pelvis: Secondary | ICD-10-CM

## 2013-08-08 DIAGNOSIS — F4311 Post-traumatic stress disorder, acute: Secondary | ICD-10-CM

## 2013-08-08 DIAGNOSIS — F4310 Post-traumatic stress disorder, unspecified: Secondary | ICD-10-CM

## 2013-08-08 DIAGNOSIS — M542 Cervicalgia: Secondary | ICD-10-CM

## 2013-08-08 MED ORDER — HYDROCODONE-ACETAMINOPHEN 7.5-325 MG PO TABS: ORAL_TABLET | ORAL | Status: DC

## 2013-08-08 NOTE — Progress Notes (Deleted)
   Subjective:    Patient ID: Douglas [name], male    DOB: [birth year], 58 y.o.   MRN: [id number]  HPI   Patient here to discuss reports.  He met with a surgeon and had a bone biopsy last week.  The radiologist was not impressed with the spot on the bone.  He felt like they should get on with surgery.  Dr. Hartford Poli went ahead and done the procedure.  Patient has doubts about the place.  Dr. Anderson Malta wanted to be aggressive and just cut the place out.  He feels like they want him to go into a chemotherapy trial.  Patient would like to go ahead and have procedure.  He feels like the spot is aggressive and he would like to have this taken out.  Wife is going to contact [facility] and get a second opinion.  Patient is really afraid and feels like he is going to be fighting for his life with the spot growing.      Review of Systems     Objective:   Physical Exam        Assessment & Plan:

## 2013-08-08 NOTE — Progress Notes (Signed)
   Subjective:    Patient ID: Douglas [name], male    DOB: 1956/03/28, 58 y.o.   MRN: [id number]  HPI patient here to followup and discuss his renal cell cancer. [facility] has been at the [facility] and he found to suspicious lesions that are going to be removed surgically. He is scheduled for surgery in the near future. He also had an abnormal area in his right pelvis which was recently biopsied. He overall feels well anxious to have his surgery done . He is worried he is going to die .    Review of Systems     Objective:   Physical Exam patient alert and cooperative anxious. Chest clear. Heart regular rate no murmurs        Assessment & Plan:  Awaiting biopsy results. Further decisions to be planned after the biopsy results are in. Initial plan is to have surgical removal of suspected implants. These metastatic implants appear to be in the tissue surrounding his previous surgical site. Preliminary report on his bone biopsy is benign.

## 2013-08-18 ENCOUNTER — Other Ambulatory Visit: Payer: Self-pay | Admitting: Emergency Medicine

## 2013-08-22 ENCOUNTER — Other Ambulatory Visit: Payer: Self-pay

## 2013-08-22 MED ORDER — ATORVASTATIN CALCIUM 10 MG PO TABS: 10.0000 mg | ORAL_TABLET | Freq: Every day | ORAL | Status: DC

## 2013-08-22 MED ORDER — LOSARTAN POTASSIUM 50 MG PO TABS: 50.0000 mg | ORAL_TABLET | Freq: Every day | ORAL | Status: DC

## 2013-09-17 DIAGNOSIS — Z9289 Personal history of other medical treatment: Secondary | ICD-10-CM

## 2013-09-17 HISTORY — DX: Personal history of other medical treatment: Z92.89

## 2013-09-22 HISTORY — PX: APPENDECTOMY: SHX54

## 2013-09-22 HISTORY — PX: CHOLECYSTECTOMY: SHX55

## 2013-09-22 HISTORY — PX: PARTIAL NEPHRECTOMY: SHX414

## 2013-09-22 HISTORY — PX: LYMPH NODE DISSECTION: SHX5087

## 2013-09-22 HISTORY — PX: RENAL ARTERY STENT: SHX2321

## 2013-09-22 HISTORY — PX: RIGHT COLECTOMY: SHX853

## 2013-09-22 HISTORY — PX: OTHER SURGICAL HISTORY: SHX169

## 2013-10-01 ENCOUNTER — Telehealth: Payer: Self-pay

## 2013-10-01 NOTE — Telephone Encounter (Signed)
Patients social worker wanted to ask if one of the providers are willing to sign orders for home health services at [facility] (Dr. Everlene Farrier or Dr. Lorelei Pont. ) patients sees them both. For further questions call Leonides Sake the Education officer, museum.    Leonides Sake telephone: [phone]

## 2013-10-01 NOTE — Telephone Encounter (Signed)
She is faxing the orders today BTW

## 2013-10-02 ENCOUNTER — Telehealth: Payer: Self-pay | Admitting: *Deleted

## 2013-10-02 NOTE — Telephone Encounter (Signed)
Pt is post right heminephrectomy, right choley he has a peritoneal drain that empties into a urostomy bag due to high output- Including urine leak He is unable to be set up with a home health care agency due to staffing issues- Leonides Sake has called all of the agencies in the area and the soonest pt would receive wound care is the end of next week- [facility] He is nervous about doing self wound care- he is very anxious and concerned about getting infection.  Can we provide wound care for him in our office until hh can be set up? Pt wife will transport pt from home to [place] if possible.   Leonides Sake [phone]

## 2013-10-02 NOTE — Telephone Encounter (Signed)
Call patient and let him know we will be sure he has wound care once he gets back to [facility].

## 2013-10-02 NOTE — Telephone Encounter (Signed)
Lm for Leonides Sake to call back- pt needs to RTC as soon as possible once pt rtn to [place]. Need to give her Dr. Everlene Farrier schedule for pt to come in

## 2013-10-04 NOTE — Telephone Encounter (Signed)
lmom that we would do his wound care and pt wife to call with schedule

## 2013-10-05 ENCOUNTER — Ambulatory Visit (INDEPENDENT_AMBULATORY_CARE_PROVIDER_SITE_OTHER): Payer: BC Managed Care – PPO | Admitting: Emergency Medicine

## 2013-10-05 VITALS — BP 110/70 | HR 90 | Temp 98.2°F | Resp 18 | Ht 72.0 in | Wt 190.0 lb

## 2013-10-05 DIAGNOSIS — L24A9 Irritant contact dermatitis due friction or contact with other specified body fluids: Secondary | ICD-10-CM

## 2013-10-05 DIAGNOSIS — F17200 Nicotine dependence, unspecified, uncomplicated: Secondary | ICD-10-CM

## 2013-10-05 DIAGNOSIS — T148XXA Other injury of unspecified body region, initial encounter: Secondary | ICD-10-CM

## 2013-10-05 DIAGNOSIS — C649 Malignant neoplasm of unspecified kidney, except renal pelvis: Secondary | ICD-10-CM

## 2013-10-05 DIAGNOSIS — S31109A Unspecified open wound of abdominal wall, unspecified quadrant without penetration into peritoneal cavity, initial encounter: Secondary | ICD-10-CM

## 2013-10-05 NOTE — Progress Notes (Addendum)
   Subjective:  This chart was scribed for [name], MD by Ludger Nutting, ED Scribe. This patient was seen in room 5 and the patient's care was started 3:15 PM.    Patient ID: Douglas [name], male    DOB: [birth year], 58 y.o.   MRN: [id number]  HPI HPI Comments: Douglas [name] is a 58 y.o. male who presents to [facility] requesting follow up today. He has a history of hereditary leiomyomatosis and renal cell cancer and recently had surgery to remove the right colon, appendix, gallbladder, and fascia overlying the right psoas. He has a stent placed in the right renal pelvis to drain urine but he is also able to void. Patient states he was discharged from the hospital on 10/03/13. Patient states he developed an infection to the surgical site which was caused by the surgical glue that was used. He is currently taking Keflex and applying an astringent solution to the wound site with improvement. Patient has been able to tolerate food but does have a few dietary restrictions.    Review of Systems  Constitutional: Negative for fever and chills.  Respiratory: Negative for shortness of breath.   Skin:       + irritation to the surgical site        Objective:   Physical Exam  Vitals reviewed.   CONSTITUTIONAL: Well developed/well nourished alert and talkative. HEAD: Normocephalic/atraumatic EYES: EOMI/PERRL ENMT: Mucous membranes moist NECK: supple no meningeal signs SPINE:entire spine nontender CV: S1/S2 noted, no murmurs/rubs/gallops noted LUNGS: Lungs are clear to auscultation bilaterally, no apparent distress ABDOMEN: There is a L-shaped incision over the midportion of the abdomen extending towards right lower quadrant. There is a renal pelvis stent with bag in the right lower abdomen. There are no areas of specific tenderness. There is redness with some blister formation over the surgical site. GU:no cva tenderness NEURO: Pt is awake/alert, moves all extremitiesx4 EXTREMITIES: pulses  normal, full ROM SKIN: warm, color normal PSYCH: no abnormalities of mood noted        Assessment & Plan:  Patient here for followup of his renal papillary cancer. He carries a genetic predisposition to renal cancer. He has had metastatic spread to the tissue around the right kidney extending into the right side of the colon and tissue over the psoas muscle. He underwent any extensive surgical resection. A portion of the kidney was left in order to have good kidney function but the possibility of chemotherapy in the future. I did speak with his treating physician Dr. [name].. We will arrange home health care. Medications will be left unchanged. He will see Dr. Jonette Eva in [facility] who trained with Dr. Thereasa Distance I personally performed the services described in this documentation, which was scribed in my presence. The recorded information has been reviewed and is accurate.

## 2013-10-06 ENCOUNTER — Telehealth: Payer: Self-pay | Admitting: Emergency Medicine

## 2013-10-06 NOTE — Telephone Encounter (Signed)
I received a call from Dr. Meade Maw. 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 He is agreeable to accept [facility] as a patient.

## 2013-10-07 NOTE — Telephone Encounter (Signed)
Gave wife [name] info about Dr Nevada Crane and she stated that Home health was out to see pt yesterday and will be returning Friday. Dr Everlene Farrier, [facility].

## 2013-10-08 ENCOUNTER — Other Ambulatory Visit: Payer: Self-pay | Admitting: Emergency Medicine

## 2013-10-08 MED ORDER — TRIAMCINOLONE ACETONIDE 0.1 % EX CREA: 1.0000 "application " | TOPICAL_CREAM | Freq: Two times a day (BID) | CUTANEOUS | Status: DC

## 2013-10-08 MED ORDER — CLINDAMYCIN PHOSPHATE 1 % EX LOTN: TOPICAL_LOTION | Freq: Two times a day (BID) | CUTANEOUS | Status: DC

## 2013-10-09 ENCOUNTER — Telehealth: Payer: Self-pay

## 2013-10-09 NOTE — Telephone Encounter (Signed)
Patient's wife called to request a refill for patient's pain medication (oxycodon 5mg  tablets). She states she spoke with Dr. Everlene Farrier today and he advised her to call in an put in a refill request so that he could refill the medication. Thank you.

## 2013-10-10 ENCOUNTER — Other Ambulatory Visit: Payer: Self-pay | Admitting: Emergency Medicine

## 2013-10-10 MED ORDER — OXYCODONE HCL 5 MG PO TABS: ORAL_TABLET | ORAL | Status: DC

## 2013-10-10 MED ORDER — OXYCODONE HCL 5 MG PO TABA: 1.0000 | ORAL_TABLET | ORAL | Status: DC

## 2013-10-10 NOTE — Telephone Encounter (Signed)
Rx printed and wife notified ready for p/up.

## 2013-10-13 ENCOUNTER — Other Ambulatory Visit: Payer: Self-pay | Admitting: Emergency Medicine

## 2013-10-13 ENCOUNTER — Other Ambulatory Visit: Payer: Self-pay | Admitting: Radiology

## 2013-10-13 ENCOUNTER — Telehealth: Payer: Self-pay | Admitting: Emergency Medicine

## 2013-10-13 DIAGNOSIS — C649 Malignant neoplasm of unspecified kidney, except renal pelvis: Secondary | ICD-10-CM

## 2013-10-13 MED ORDER — OXYCODONE HCL ER 20 MG PO T12A: 20.0000 mg | EXTENDED_RELEASE_TABLET | Freq: Two times a day (BID) | ORAL | Status: DC

## 2013-10-13 MED ORDER — OXYCODONE HCL ER 20 MG PO T12A: 40.0000 mg | EXTENDED_RELEASE_TABLET | Freq: Two times a day (BID) | ORAL | Status: DC

## 2013-10-13 NOTE — Telephone Encounter (Signed)
Patient has had decreased drainage from his indwelling catheter which is placed in the right renal pelvis. He talked with his Psychologist, sport and exercise at [place]. Plan is for him to go to the emergency room at [facility] long if he develops increasing pain.

## 2013-10-16 ENCOUNTER — Ambulatory Visit (INDEPENDENT_AMBULATORY_CARE_PROVIDER_SITE_OTHER): Payer: BC Managed Care – PPO | Admitting: Emergency Medicine

## 2013-10-16 ENCOUNTER — Inpatient Hospital Stay (HOSPITAL_COMMUNITY)
Admission: AD | Admit: 2013-10-16 | Discharge: 2013-10-19 | DRG: 690 | Disposition: A | Discharge status: Home / Self Care | Payer: BC Managed Care – PPO | Source: Ambulatory Visit | Attending: Family Medicine | Admitting: Family Medicine

## 2013-10-16 ENCOUNTER — Inpatient Hospital Stay (HOSPITAL_COMMUNITY): Payer: BC Managed Care – PPO

## 2013-10-16 ENCOUNTER — Encounter (HOSPITAL_COMMUNITY): Payer: Self-pay | Admitting: General Practice

## 2013-10-16 VITALS — BP 106/64 | HR 109 | Temp 98.1°F | Resp 18 | Ht 72.0 in | Wt 178.8 lb

## 2013-10-16 DIAGNOSIS — G8929 Other chronic pain: Secondary | ICD-10-CM | POA: Diagnosis present

## 2013-10-16 DIAGNOSIS — Z905 Acquired absence of kidney: Secondary | ICD-10-CM

## 2013-10-16 DIAGNOSIS — F17200 Nicotine dependence, unspecified, uncomplicated: Secondary | ICD-10-CM | POA: Diagnosis present

## 2013-10-16 DIAGNOSIS — R350 Frequency of micturition: Secondary | ICD-10-CM

## 2013-10-16 DIAGNOSIS — F411 Generalized anxiety disorder: Secondary | ICD-10-CM | POA: Diagnosis present

## 2013-10-16 DIAGNOSIS — E43 Unspecified severe protein-calorie malnutrition: Secondary | ICD-10-CM | POA: Insufficient documentation

## 2013-10-16 DIAGNOSIS — Z1509 Genetic susceptibility to other malignant neoplasm: Secondary | ICD-10-CM | POA: Diagnosis present

## 2013-10-16 DIAGNOSIS — N12 Tubulo-interstitial nephritis, not specified as acute or chronic: Secondary | ICD-10-CM | POA: Diagnosis present

## 2013-10-16 DIAGNOSIS — Z79899 Other long term (current) drug therapy: Secondary | ICD-10-CM

## 2013-10-16 DIAGNOSIS — J449 Chronic obstructive pulmonary disease, unspecified: Secondary | ICD-10-CM | POA: Diagnosis present

## 2013-10-16 DIAGNOSIS — K5900 Constipation, unspecified: Secondary | ICD-10-CM | POA: Diagnosis present

## 2013-10-16 DIAGNOSIS — C786 Secondary malignant neoplasm of retroperitoneum and peritoneum: Secondary | ICD-10-CM | POA: Diagnosis present

## 2013-10-16 DIAGNOSIS — M545 Low back pain, unspecified: Secondary | ICD-10-CM | POA: Diagnosis present

## 2013-10-16 DIAGNOSIS — J309 Allergic rhinitis, unspecified: Secondary | ICD-10-CM | POA: Diagnosis present

## 2013-10-16 DIAGNOSIS — I10 Essential (primary) hypertension: Secondary | ICD-10-CM

## 2013-10-16 DIAGNOSIS — E46 Unspecified protein-calorie malnutrition: Secondary | ICD-10-CM | POA: Diagnosis present

## 2013-10-16 DIAGNOSIS — C649 Malignant neoplasm of unspecified kidney, except renal pelvis: Secondary | ICD-10-CM | POA: Diagnosis present

## 2013-10-16 DIAGNOSIS — N390 Urinary tract infection, site not specified: Secondary | ICD-10-CM

## 2013-10-16 DIAGNOSIS — N3090 Cystitis, unspecified without hematuria: Principal | ICD-10-CM | POA: Diagnosis present

## 2013-10-16 DIAGNOSIS — J4489 Other specified chronic obstructive pulmonary disease: Secondary | ICD-10-CM | POA: Diagnosis present

## 2013-10-16 DIAGNOSIS — E785 Hyperlipidemia, unspecified: Secondary | ICD-10-CM | POA: Diagnosis present

## 2013-10-16 DIAGNOSIS — K219 Gastro-esophageal reflux disease without esophagitis: Secondary | ICD-10-CM | POA: Diagnosis present

## 2013-10-16 DIAGNOSIS — R1110 Vomiting, unspecified: Secondary | ICD-10-CM

## 2013-10-16 HISTORY — DX: Major depressive disorder, single episode, unspecified: F32.9

## 2013-10-16 HISTORY — DX: Genetic susceptibility to other malignant neoplasm: Z15.09

## 2013-10-16 HISTORY — DX: Malignant melanoma of other part of trunk: C43.59

## 2013-10-16 HISTORY — DX: Unspecified osteoarthritis, unspecified site: M19.90

## 2013-10-16 HISTORY — DX: Other chronic pain: G89.29

## 2013-10-16 HISTORY — DX: Personal history of other medical treatment: Z92.89

## 2013-10-16 HISTORY — DX: Dorsalgia, unspecified: M54.9

## 2013-10-16 HISTORY — DX: Malignant neoplasm of unspecified kidney, except renal pelvis: C64.9

## 2013-10-16 HISTORY — DX: Depression, unspecified: F32.A

## 2013-10-16 LAB — CBC WITH DIFFERENTIAL/PLATELET
BASOS ABS: 0 10*3/uL (ref 0.0–0.1)
Basophils Relative: 0 % (ref 0–1)
EOS ABS: 0 10*3/uL (ref 0.0–0.7)
Eosinophils Relative: 0 % (ref 0–5)
HCT: 34.4 % — ABNORMAL LOW (ref 39.0–52.0)
Hemoglobin: 11.7 g/dL — ABNORMAL LOW (ref 13.0–17.0)
Lymphocytes Relative: 8 % — ABNORMAL LOW (ref 12–46)
Lymphs Abs: 1.3 10*3/uL (ref 0.7–4.0)
MCH: 30.4 pg (ref 26.0–34.0)
MCHC: 34 g/dL (ref 30.0–36.0)
MCV: 89.4 fL (ref 78.0–100.0)
Monocytes Absolute: 1.1 10*3/uL — ABNORMAL HIGH (ref 0.1–1.0)
Monocytes Relative: 7 % (ref 3–12)
NEUTROS PCT: 85 % — AB (ref 43–77)
Neutro Abs: 13.3 10*3/uL — ABNORMAL HIGH (ref 1.7–7.7)
PLATELETS: 308 10*3/uL (ref 150–400)
RBC: 3.85 MIL/uL — ABNORMAL LOW (ref 4.22–5.81)
RDW: 14.1 % (ref 11.5–15.5)
WBC: 15.6 10*3/uL — ABNORMAL HIGH (ref 4.0–10.5)

## 2013-10-16 LAB — POCT UA - MICROSCOPIC ONLY
AMORPHOUS UA: POSITIVE
CRYSTALS, UR, HPF, POC: NEGATIVE
Casts, Ur, LPF, POC: NEGATIVE
Epithelial cells, urine per micros: NEGATIVE
MUCUS UA: NEGATIVE
Yeast, UA: NEGATIVE

## 2013-10-16 LAB — BASIC METABOLIC PANEL
BUN: 12 mg/dL (ref 6–23)
CHLORIDE: 101 meq/L (ref 96–112)
CO2: 22 mEq/L (ref 19–32)
Calcium: 9.3 mg/dL (ref 8.4–10.5)
Creat: 0.95 mg/dL (ref 0.50–1.35)
Glucose, Bld: 103 mg/dL — ABNORMAL HIGH (ref 70–99)
POTASSIUM: 4 meq/L (ref 3.5–5.3)
Sodium: 135 mEq/L (ref 135–145)

## 2013-10-16 LAB — POCT URINALYSIS DIPSTICK
Bilirubin, UA: NEGATIVE
Bilirubin, UA: NEGATIVE
Glucose, UA: NEGATIVE
Glucose, UA: NEGATIVE
KETONES UA: NEGATIVE
KETONES UA: NEGATIVE
NITRITE UA: POSITIVE
Nitrite, UA: POSITIVE
PH UA: 5.5
Protein, UA: >=300
Protein, UA: 100
Spec Grav, UA: >=1.03
Spec Grav, UA: 1.025
Urobilinogen, UA: 0.2
Urobilinogen, UA: 0.2
pH, UA: 5.5

## 2013-10-16 LAB — POCT CBC
Granulocyte percent: 86.4 %G — AB (ref 37–80)
HEMATOCRIT: 95.5 % — AB (ref 43.5–53.7)
HEMOGLOBIN: 11.6 g/dL — AB (ref 14.1–18.1)
LYMPH, POC: 1.5 (ref 0.6–3.4)
MCH, POC: 30.2 pg (ref 27–31.2)
MCHC: 32.7 g/dL (ref 31.8–35.4)
MCV: 92.5 fL (ref 80–97)
MID (cbc): 0.9 (ref 0–0.9)
MPV: 8 fL (ref 0–99.8)
PLATELET COUNT, POC: 363 10*3/uL (ref 142–424)
POC Granulocyte: 15.4 — AB (ref 2–6.9)
POC LYMPH PERCENT: 8.6 %L — AB (ref 10–50)
POC MID %: 5 %M (ref 0–12)
RBC: 3.84 M/uL — AB (ref 4.69–6.13)
RDW, POC: 13.9 %
WBC: 17.8 10*3/uL — AB (ref 4.6–10.2)

## 2013-10-16 LAB — CREATININE, SERUM
CREATININE: 1.05 mg/dL (ref 0.50–1.35)
GFR calc Af Amer: 89 mL/min — ABNORMAL LOW (ref >90)
GFR, EST NON AFRICAN AMERICAN: 76 mL/min — AB (ref >90)

## 2013-10-16 IMAGING — CT CT ABD-PELV W/ CM
2 of 5 series · 15 of 46 positions shown, 17 images · IV contrast (Omni 300)
Comparison: [DATE] and CT from [DATE]

CLINICAL DATA: Right-sided discomfort. Trouble urinating. History
of right partial nephrectomy with right colectomy, cholecystectomy
and appendectomy.

EXAM:
CT ABDOMEN AND PELVIS WITH CONTRAST
TECHNIQUE: Multidetector CT imaging of the abdomen and pelvis was performed
using the standard protocol following bolus administration of
intravenous contrast.
CONTRAST:  100mL OMNIPAQUE IOHEXOL 300 MG/ML  SOLN

[Series 2: abd/ pelvis 5.0 i30f 1 · axial · 0.76mm/px · z∈[+973,+1398]mm · 12 of 95 slices shown, 14 images]
[im 5/95  soft-tissue]
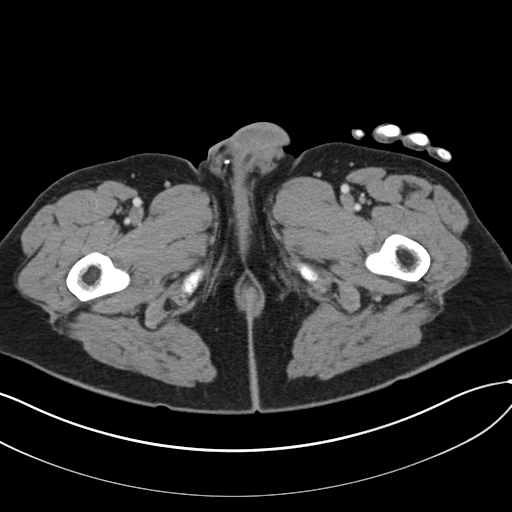
[im 5/95  bone]
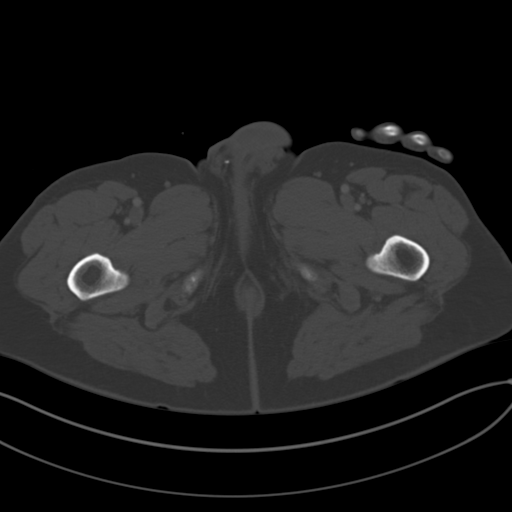
[im 15/95  soft-tissue]
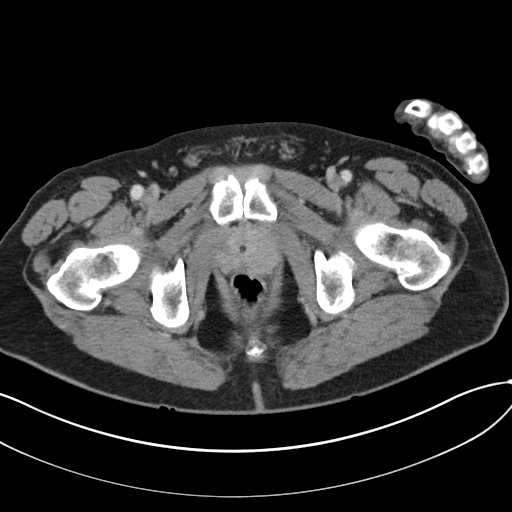
[im 20/95  soft-tissue]
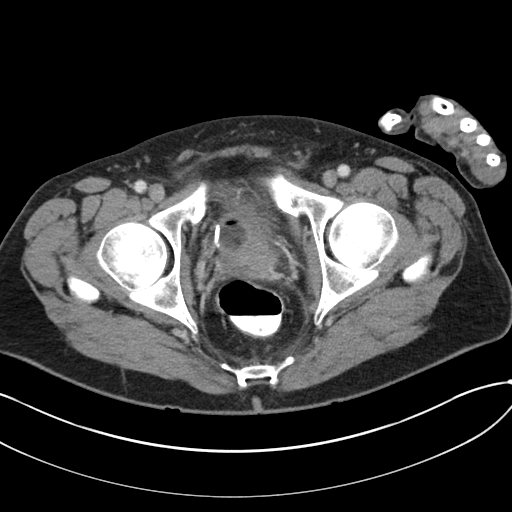
[im 30/95  soft-tissue]
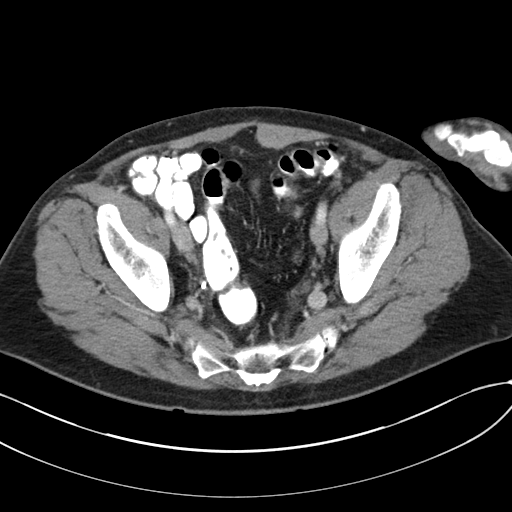
[im 35/95  soft-tissue]
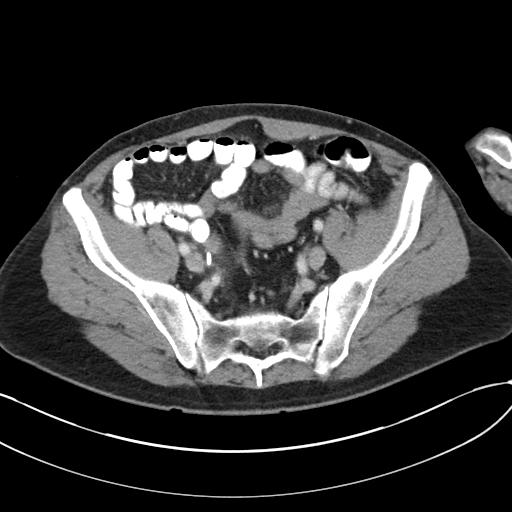
[im 45/95  soft-tissue]
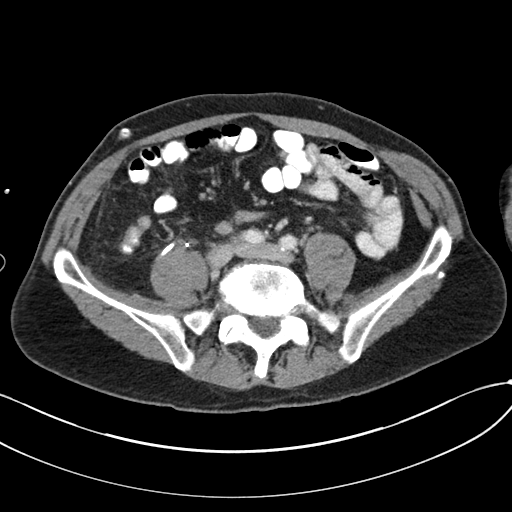
[im 50/95  soft-tissue]
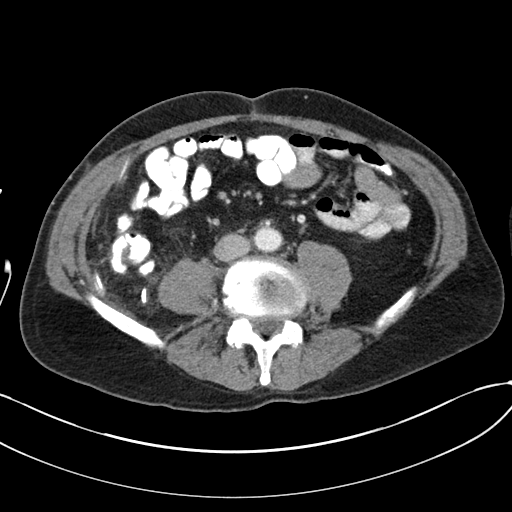
[im 60/95  soft-tissue]
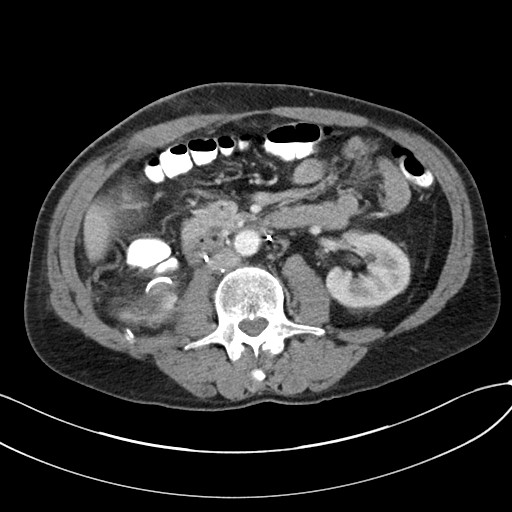
[im 65/95  soft-tissue]
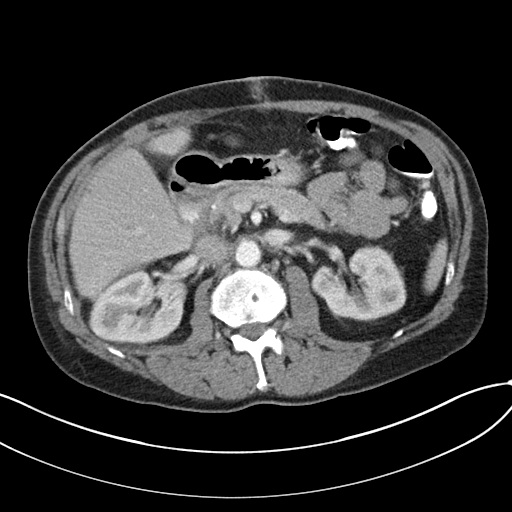
[im 65/95  bone]
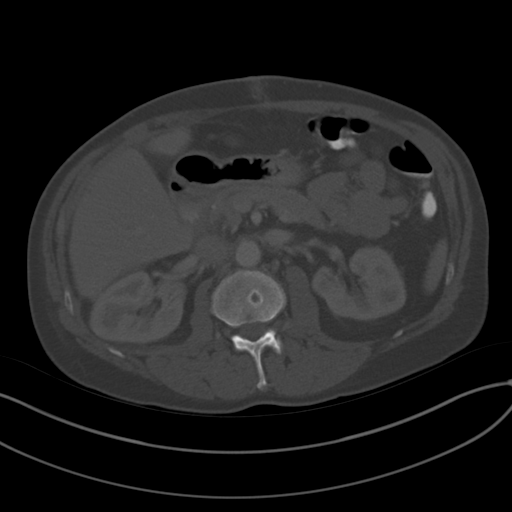
[im 75/95  soft-tissue]
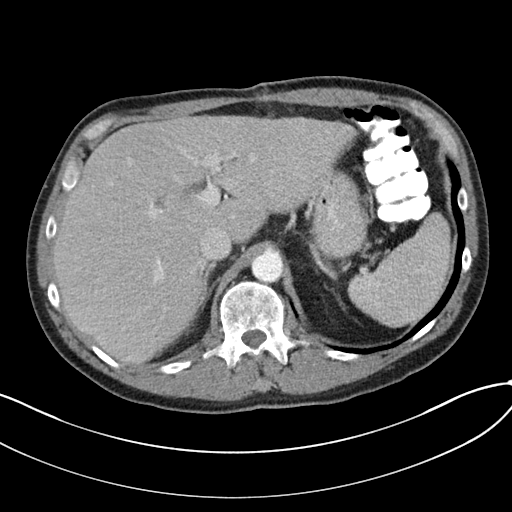
[im 80/95  soft-tissue]
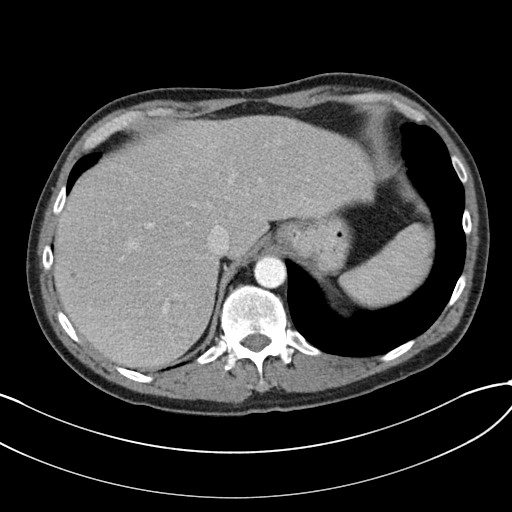
[im 90/95  soft-tissue]
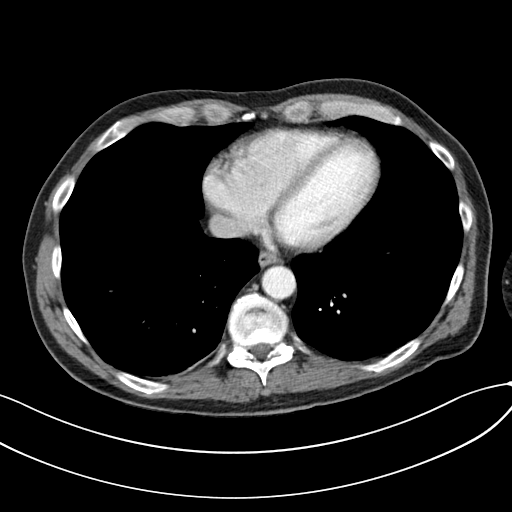

[Series 5: coronals · coronal · 0.72mm/px · 3 of 122 slices shown]
[im 41/122  soft-tissue]
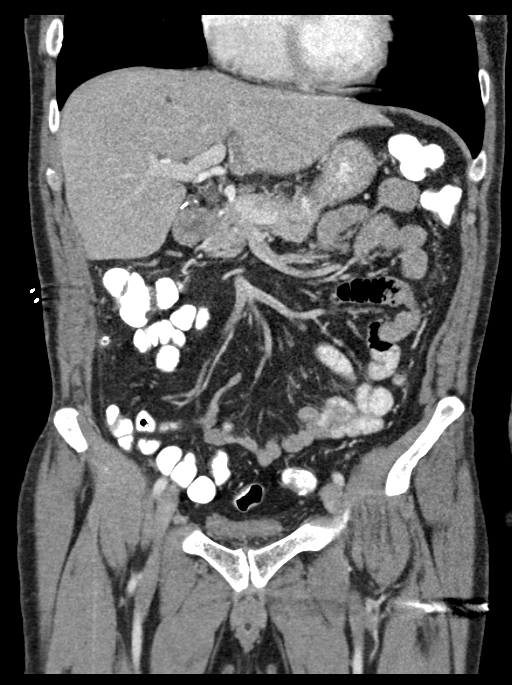
[im 54/122  soft-tissue]
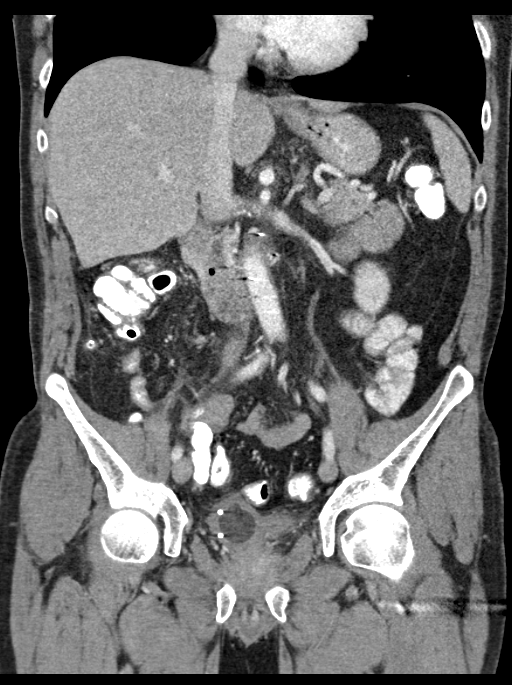
[im 68/122  soft-tissue]
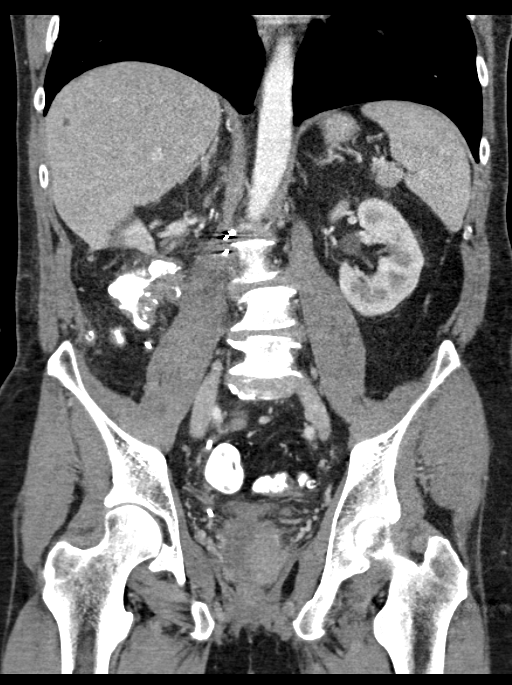

[15 of 46 positions shown; findings below may reference images not displayed]

FINDINGS: Lung bases are clear.  No evidence for free air.

Multiple tiny low-density structures in the liver probably represent
small cysts. The portal venous system is patent and the gallbladder
has been removed. There is new focal low-density in the liver near
the gallbladder fossa which could represent focal edema or fluid.
This area is as seen on sequence 2, image 27. Normal appearance of
the adrenal glands, spleen and left kidney. There is a Foley
catheter in the urinary bladder. There is a right ureter stent that
extends from the bladder to the right renal pelvis.

Patient had a partial nephrectomy along the inferior right kidney.
There is a right surgical drain that terminates posterior to the
right kidney. There is no significant fluid in the right abdomen or
around the drain. No evidence for an abscess collection. Prostate
measures 5.5 cm in transverse dimension. There are surgical clips
around the inferior vena cava and aorta consistent with prior lymph
node dissection.

There appears to be focal wall thickening or possibly stool-like
material in the right colon near the surgical clips. This is best
seen on sequence 2, image 40. The ileal-colonic anastomosis is
patent. There is contrast in the small and large bowel and no
evidence for an obstruction. Mild stranding or edematous changes in
the right abdomen consistent with recent surgery. There appears to
be a duodenum diverticulum near the second and third portions of the
duodenum. No gross abnormality to the pancreas. Distal common bile
duct is mildly dilated and probably secondary to the
cholecystectomy.

Stable area of sclerosis along the left ilium and left side of the
SI joint. Degenerative disc disease in the lower lumbar spine.
IMPRESSION: Postsurgical changes in the right abdomen consistent with a partial
right nephrectomy and right colectomy. There is no evidence for a
postoperative fluid collection or abscess.

Focal wall thickening in the right colon near the surgical clips.
The anastomosis is patent.

Post cholecystectomy changes. There is a new area of low density
involving the central aspect of the left hepatic lobe. This could
represent edematous changes from the recent surgery.

Right ureter stent appears to be appropriately positioned.

## 2013-10-16 MED ORDER — SODIUM CHLORIDE 0.9 % IV SOLN
INTRAVENOUS | Status: DC
  Administered 2013-10-16: 10 mL/h via INTRAVENOUS

## 2013-10-16 MED ORDER — ATORVASTATIN CALCIUM 10 MG PO TABS
10.0000 mg | ORAL_TABLET | Freq: Every day | ORAL | Status: DC
  Administered 2013-10-16 – 2013-10-18 (×3): 10 mg via ORAL
  Filled 2013-10-16 (×6): qty 1

## 2013-10-16 MED ORDER — ONE-DAILY MULTI VITAMINS PO TABS: 1.0000 | ORAL_TABLET | Freq: Every day | ORAL | Status: DC

## 2013-10-16 MED ORDER — IOHEXOL 300 MG/ML  SOLN
100.0000 mL | Freq: Once | INTRAMUSCULAR | Status: AC | PRN
  Administered 2013-10-16: 100 mL via INTRAVENOUS

## 2013-10-16 MED ORDER — NICOTINE POLACRILEX 2 MG MT GUM: 4.0000 mg | CHEWING_GUM | OROMUCOSAL | Status: DC | PRN

## 2013-10-16 MED ORDER — B COMPLEX PO TABS: 1.0000 | ORAL_TABLET | Freq: Every day | ORAL | Status: DC

## 2013-10-16 MED ORDER — VITAMIN E 180 MG (400 UNIT) PO CAPS: 400.0000 [IU] | ORAL_CAPSULE | Freq: Every day | ORAL | Status: DC

## 2013-10-16 MED ORDER — ZOLPIDEM TARTRATE 5 MG PO TABS
5.0000 mg | ORAL_TABLET | Freq: Every evening | ORAL | Status: DC | PRN
  Administered 2013-10-17 – 2013-10-19 (×3): 5 mg via ORAL
  Filled 2013-10-16 (×3): qty 1

## 2013-10-16 MED ORDER — ONDANSETRON HCL 4 MG/2ML IJ SOLN
4.0000 mg | Freq: Once | INTRAMUSCULAR | Status: AC
  Administered 2013-10-17: 4 mg via INTRAVENOUS
  Filled 2013-10-16: qty 2

## 2013-10-16 MED ORDER — TRIAMCINOLONE ACETONIDE 0.1 % EX CREA
1.0000 "application " | TOPICAL_CREAM | Freq: Two times a day (BID) | CUTANEOUS | Status: DC
  Administered 2013-10-17 – 2013-10-19 (×4): 1 via TOPICAL
  Filled 2013-10-16: qty 15

## 2013-10-16 MED ORDER — ACETAMINOPHEN 325 MG PO TABS: 650.0000 mg | ORAL_TABLET | Freq: Four times a day (QID) | ORAL | Status: DC | PRN

## 2013-10-16 MED ORDER — OXYCODONE HCL 5 MG PO TABS
5.0000 mg | ORAL_TABLET | ORAL | Status: DC | PRN
  Administered 2013-10-16 – 2013-10-19 (×15): 5 mg via ORAL
  Filled 2013-10-16 (×16): qty 1

## 2013-10-16 MED ORDER — LIDOCAINE HCL 2 % EX GEL
Freq: Once | CUTANEOUS | Status: AC
  Administered 2013-10-16: 19:00:00 via URETHRAL
  Filled 2013-10-16: qty 5

## 2013-10-16 MED ORDER — FLUTICASONE PROPIONATE 50 MCG/ACT NA SUSP
1.0000 | Freq: Every day | NASAL | Status: DC
  Administered 2013-10-16 – 2013-10-19 (×4): 1 via NASAL
  Filled 2013-10-16: qty 16

## 2013-10-16 MED ORDER — HEPARIN SODIUM (PORCINE) 5000 UNIT/ML IJ SOLN
5000.0000 [IU] | Freq: Three times a day (TID) | INTRAMUSCULAR | Status: DC
  Administered 2013-10-16 – 2013-10-19 (×8): 5000 [IU] via SUBCUTANEOUS
  Filled 2013-10-16 (×13): qty 1

## 2013-10-16 MED ORDER — CLINDAMYCIN PHOSPHATE 1 % EX LOTN
TOPICAL_LOTION | Freq: Two times a day (BID) | CUTANEOUS | Status: DC
  Administered 2013-10-17 – 2013-10-18 (×2): via TOPICAL

## 2013-10-16 MED ORDER — LOSARTAN POTASSIUM 50 MG PO TABS
50.0000 mg | ORAL_TABLET | Freq: Every day | ORAL | Status: DC
  Administered 2013-10-17 – 2013-10-19 (×3): 50 mg via ORAL
  Filled 2013-10-16 (×4): qty 1

## 2013-10-16 MED ORDER — SENNOSIDES-DOCUSATE SODIUM 8.6-50 MG PO TABS
1.0000 | ORAL_TABLET | Freq: Every day | ORAL | Status: DC
  Administered 2013-10-16: 1 via ORAL
  Filled 2013-10-16: qty 1

## 2013-10-16 MED ORDER — ACETAMINOPHEN 650 MG RE SUPP
650.0000 mg | Freq: Four times a day (QID) | RECTAL | Status: DC | PRN
Start: 2013-10-16 — End: 2013-10-19

## 2013-10-16 MED ORDER — IOHEXOL 300 MG/ML  SOLN
25.0000 mL | INTRAMUSCULAR | Status: AC
  Administered 2013-10-16: 25 mL via ORAL

## 2013-10-16 MED ORDER — VITAMIN C 500 MG PO TABS: 500.0000 mg | ORAL_TABLET | Freq: Every day | ORAL | Status: DC

## 2013-10-16 MED ORDER — POLYETHYLENE GLYCOL 3350 17 G PO PACK
17.0000 g | PACK | Freq: Every day | ORAL | Status: DC
  Filled 2013-10-16: qty 1

## 2013-10-16 MED ORDER — DOCUSATE SODIUM 100 MG PO CAPS
100.0000 mg | ORAL_CAPSULE | Freq: Every day | ORAL | Status: DC
  Administered 2013-10-19: 100 mg via ORAL
  Filled 2013-10-16 (×2): qty 1

## 2013-10-16 MED ORDER — PIPERACILLIN-TAZOBACTAM 3.375 G IVPB
3.3750 g | Freq: Three times a day (TID) | INTRAVENOUS | Status: DC
  Administered 2013-10-16 – 2013-10-19 (×8): 3.375 g via INTRAVENOUS
  Filled 2013-10-16 (×10): qty 50

## 2013-10-16 MED ORDER — VITAMIN D3 25 MCG (1000 UNIT) PO TABS: 1000.0000 [IU] | ORAL_TABLET | Freq: Every day | ORAL | Status: DC

## 2013-10-16 MED ORDER — NICOTINE POLACRILEX 2 MG MT GUM
4.0000 mg | CHEWING_GUM | OROMUCOSAL | Status: DC | PRN
  Filled 2013-10-16: qty 2

## 2013-10-16 MED ORDER — PANTOPRAZOLE SODIUM 40 MG PO TBEC
40.0000 mg | DELAYED_RELEASE_TABLET | Freq: Every day | ORAL | Status: DC
  Administered 2013-10-16: 40 mg via ORAL
  Filled 2013-10-16: qty 1

## 2013-10-16 MED ORDER — DIAZEPAM 2 MG PO TABS
2.0000 mg | ORAL_TABLET | Freq: Three times a day (TID) | ORAL | Status: DC | PRN
  Administered 2013-10-16: 4 mg via ORAL
  Filled 2013-10-16: qty 2

## 2013-10-16 NOTE — Progress Notes (Signed)
UR completed.  Maraya Gwilliam, RN BSN [facility] CCM Trauma/Neuro ICU Case Manager [phone]  

## 2013-10-16 NOTE — H&P (Signed)
[facility] ATTENDING ADMISSION NOTE Douglas Boulos,Douglas [name] I  have seen and examined this patient, reviewed their chart. I have discussed this patient with the resident. I agree with the resident's findings, assessment and care plan.  58 Y/O M with Pmx of Hereditary Leiomyomatosis Renal Cell CA S/P partial nephrectomy x2,GERD,HLD,HTN was sent to the hospital by his PCP for worsening urinary urgency and hesitancy,chills but no fever, he has also noticed change in his urine color over the past week. He described his urine to be cloudy. No obvious blood in his urine, he denies any dysuria but feels pressure like symptom on the tip of his penis and well as irritation of his scrotum in the last few days, he denies scrotal swelling or pain. Last partial nephrectomy was about 3wks ago at [facility].  No current facility-administered medications on file prior to encounter.   Current Outpatient Prescriptions on File Prior to Encounter  Medication Sig Dispense Refill  . atorvastatin (LIPITOR) 10 MG tablet Take 1 tablet (10 mg total) by mouth at bedtime.  90 tablet  0  . clindamycin (CLEOCIN T) 1 % lotion Apply topically 2 (two) times daily. Applied to the bumps on your anterior chest twice a day  60 mL  0  . diazepam (VALIUM) 2 MG tablet Take 2-4 mg by mouth every 8 (eight) hours as needed for anxiety.      . docusate sodium (COLACE) 100 MG capsule Take 100 mg by mouth daily.      . fluticasone (FLONASE) 50 MCG/ACT nasal spray USE 2 SPRAYS IN EACH NOSTRIL AT BEDTIME  48 g  1  . losartan (COZAAR) 50 MG tablet Take 1 tablet (50 mg total) by mouth daily before breakfast.  90 tablet  0  . nicotine polacrilex (NICORETTE) 4 MG gum Take 4 mg by mouth as needed for smoking cessation.      Marland Kitchen omeprazole (PRILOSEC OTC) 20 MG tablet Take 20 mg by mouth daily.      Marland Kitchen oxyCODONE (OXY IR/ROXICODONE) 5 MG immediate release tablet Take 1-2 tablets by mouth every 4 hours as needed for pain.  60 tablet  0  . OxyCODONE (OXYCONTIN) 20 mg T12A 12  hr tablet Take 2 tablets (40 mg total) by mouth every 12 (twelve) hours.  60 tablet  0  . triamcinolone cream (KENALOG) 0.1 % Apply 1 application topically 2 (two) times daily. Applied to the adjacent area around your incision twice a day  90 g  2  . zolpidem (AMBIEN) 5 MG tablet Take 5 mg by mouth at bedtime as needed for sleep.       Past Medical History  Diagnosis Date  . Renal calculi   . Compression fracture     S/P L-SPINE; pt does not recall this hx on 10/16/2013  . GERD (gastroesophageal reflux disease)   . History of surgical fusion joint     DDD C-SPINE  . Hemoptysis   . Allergic rhinitis   . Hyperlipidemia   . Encounter for long-term (current) use of other medications   . COPD (chronic obstructive pulmonary disease)     FVC .66  . Tobacco use disorder   . Melanoma in situ   . Unspecified vitamin D deficiency   . Leiomyoma OF SKIN    INCREASED RISK RENAL CELL CA  NEEDS CT OF KIDNEYS EVERY 2 YEARS NEXT DUE  02/06/13  . Hypertension   . Neuromuscular disorder   . History of blood transfusion 09/2013    "think so;  not sure; related to big OR"  . Osteoarthritis   . Arthritis     "all over my body" (10/16/2013)  . Chronic back pain     "neck to lower back" (10/16/2013)  . Depression     "situational since 09/22/2013 OR"  . Melanoma of back   . 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 Renal cell carcinoma     type 2; papillary   Filed Vitals:   10/16/13 1317  BP: 115/70  Pulse: 88  Temp: 98.5 F (36.9 C)  TempSrc: Oral  Resp: 18  Height: 6' (1.829 m)  Weight: 177 lb (80.287 kg)  SpO2: 100%   Exam: Gen: Awake and alert,not in distress,calm in bed. Resp: Air entry equal and clear B/L. CV: S1 S2 normal,no murmur. Abdomen:BS+ and normoactive. Presence of healing incision scar on his abdomen from around his umbilicus to his right mid quadrant,this is mildly tender to touch. Mild suprapubic tenderness,no abdominal distension.Presence of bulb suction  on his right mid abdominal quadrant draining cloudy yellow urine,does not appear bloody. Ext: No edema  A/P: 58 Y/O M with urinary symptoms ( Urgency, hesitancy and chills): R/O Pyelonephritis and or perinephric abscess s/p kidney instrumentation ( partial right nephrectomy).     UA check, urine culture pending.     Currently on broad spectrum a/b coverage.     Dr Purcell Mouton ( Urologist from [facility]) contacted me to check on patient. He discussed the procedure done for him 3 wks ago and arrangement made for him to follow up in 3 months for stent replacement.     He mentioned after his procedure he had urine leak, he was concern if this could be contributing to his symptoms.     He will call back tomorrow for update about patient.     We will obtain CT abdomen and pelvic to assess for abscess.     He might need stent removal.     Urologist consulted here.

## 2013-10-16 NOTE — Progress Notes (Signed)
This chart was scribed for [name] MD by Allena Earing, ED Scribe. This patient was seen in room 10 and the patient's care was started at 8:10 AM .  Subjective:    Patient ID: Douglas [name], male    DOB: 1955/10/26, 58 y.o.   MRN: [id number]  HPI  HPI Comments: RAQUAN IANNONE is a 58 y.o. male with h/o renal cell canceer who presents to the [facility] for a f/u following hospitalization, "he wonders if he will ever be better and able to fish again".  He reports with concerns about bladder, he "constantly feels like he needs to go, but he is unable to". These concerns began yesterday, he states that it comes on suddenly and he has had "accidents before". He also reports sweats and chills, but he has not measured a temperature that was above normal. He reports associated lose of appetite, "when he tries to eat, he gags and throws up".   He also states that his medications are making him emotional.  He also has sinus drainage that he believes causes his vomiting.     Review of Systems  Constitutional: Positive for chills. Negative for fever.  HENT: Positive for rhinorrhea.   Respiratory: Positive for cough.   Genitourinary: Positive for dysuria and urgency.  Psychiatric/Behavioral: Negative for confusion.       Objective:   Physical Exam  Nursing note and vitals reviewed.  CONSTITUTIONAL: tearful male. HEAD: Normocephalic/atraumatic EYES: EOMI/PERRL ENMT: Mucous membranes moist NECK: supple no meningeal signs SPINE:entire spine nontender CV: S1/S2 noted, no murmurs/rubs/gallops noted, pt is tachycardic LUNGS: Lungs are clear to auscultation bilaterally, no apparent distress ABDOMEN: soft, nontender, no rebound or guarding. Well healed scars, the previous inflammation and redness have healed. Stent tube present RLQ. GU:no cva tenderness NEURO: Pt is awake/alert, moves all extremitiesx4 EXTREMITIES: pulses normal, full ROM SKIN: warm, color  normal PSYCH: no abnormalities of mood noted        Results for orders placed in visit on 10/16/13  POCT UA - MICROSCOPIC ONLY      Result Value Ref Range   WBC, Ur, HPF, POC TNTC     RBC, urine, microscopic TNTC     Bacteria, U Microscopic 2+     Mucus, UA neg     Epithelial cells, urine per micros neg     Crystals, Ur, HPF, POC neg     Casts, Ur, LPF, POC neg     Yeast, UA neg    POCT URINALYSIS DIPSTICK      Result Value Ref Range   Color, UA YELLOW     Clarity, UA TURBID     Glucose, UA NEG     Bilirubin, UA NEG     Ketones, UA NEG     Spec Grav, UA >=1.030     Blood, UA LARGE     pH, UA 5.5     Protein, UA >=300     Urobilinogen, UA 0.2     Nitrite, UA POSITIVE     Leukocytes, UA large (3+)    POCT CBC      Result Value Ref Range   WBC 17.8 (*) 4.6 - 10.2 K/uL   Lymph, poc 1.5  0.6 - 3.4   POC LYMPH PERCENT 8.6 (*) 10 - 50 %L   MID (cbc) 0.9  0 - 0.9   POC MID % 5.0  0 - 12 %M   POC Granulocyte 15.4 (*) 2 - 6.9  Granulocyte percent 86.4 (*) 37 - 80 %G   RBC 3.84 (*) 4.69 - 6.13 M/uL   Hemoglobin 11.6 (*) 14.1 - 18.1 g/dL   HCT, POC 95.5 (*) 43.5 - 53.7 %   MCV 92.5  80 - 97 fL   MCH, POC 30.2  27 - 31.2 pg   MCHC 32.7  31.8 - 35.4 g/dL   RDW, POC 13.9     Platelet Count, POC 363  142 - 424 K/uL   MPV 8.0  0 - 99.8 fL  POCT UA - MICROSCOPIC ONLY      Result Value Ref Range   WBC, Ur, HPF, POC TNTC     RBC, urine, microscopic TNTC     Bacteria, U Microscopic 2+     Mucus, UA       Epithelial cells, urine per micros       Crystals, Ur, HPF, POC       Casts, Ur, LPF, POC       Yeast, UA       Amorphous, UA positive    POCT URINALYSIS DIPSTICK      Result Value Ref Range   Color, UA dk yellow     Clarity, UA cloudy     Glucose, UA neg     Bilirubin, UA neg     Ketones, UA neg     Spec Grav, UA 1.025     Blood, UA moderate     pH, UA 5.5     Protein, UA 100     Urobilinogen, UA 0.2     Nitrite, UA positive     Leukocytes, UA large (3+)       Assessment & Plan:  I suspect the sweats are more likely secondary to infection rather than withdrawal from his pain medications. The urine from his stent has too numerous to count reds whites with positive nitrite. Patient will be a direct admit for IV antibiotics I personally performed the services described in this documentation, which was scribed in my presence. The recorded information has been reviewed and is accurate.

## 2013-10-16 NOTE — H&P (Signed)
[facility] Admission History and Physical Service Pager: [phone]  Patient name: Douglas [name] Medical record number: [id number] Date of birth: Oct 13, 1955 Age: 58 y.o. Gender: male  Primary Care Provider: Jenny Reichmann, MD Consultants: urology Code Status: full  Chief Complaint: hesitancy  Assessment and Plan: Douglas [name] is a 58 y.o. male presenting with hesit . PMH is significant for hereditary leiomyomatosis and renal cell cancer.   #Urology; Renal Cell Carcinoma/hereditary leiomyomatosis; Hesitancy and dysuria for several days duration in setting of recent right partial nephrectomy and right colectomy, now with renal stent in place; Ddx includes pyelo/uti (elevated white count, rigors and chills, u/a with TNTC rbcs and wbcs) vs displaced drain or stent (decreased drain/urinary output) vs neurogenic bladder (recent extensive exploratory surgery involving muscle manipulation, near L-spine) vs prostatitis (mildly enlarged on exam, but non tender with no nodules) vs constipation (may have increased pressure placed on bladder with obstructive sx). Pt otherwise well appearing. Consider current stent and nephrostomy drain as source of infection. -admit to med-surg under Dr. Gwendlyn Deutscher -vs per floor protocol -obtain blood cultures X2 -urine culture -start zosyn 3.375 q6 -CT abdomen pelvis wo contrast -oxycodone 5mg  q4 (avoid over medication) -tylenol 650prn mild pain -bladder scan -track i/os -urology consulted, appreciate recs -[facility] surgeon Dr. [name] to be updated as needed  #ID: concern for pyelo/abscess (see above); topical skin reaction to surgical glue; s/p 5 day course of keflex; drain site clean  -cultures and antibiotics as above -topical clinda and kenalog cream -wound care  #CV: HTN; on home lipitor and cozaar; normotensive on admission -restart home meds in house -vs per floor protocol  #FEN/GI: constipation/recent weight loss  (severe malnutrition); pt reporting recent gaging on food; may be related to anxiety around recent illness vs delayed gut emptying post surgery vs GER; no signs of obstruction in oral cavity -colace 100mg  daily -miralax 17g daily -senokot -reg diet  -consider nutrition eval -if continues to gag consider barium swallow  #Pulm/HEENT: COPD; allergic rhinitis; good air movement on admission; no home medications or O2 requirement -monitor O2 sats on vitals -smoking cessation -flonase BID  #Psych/Social: Tobacco abuse; anxiety -nicorette gum -smoking cessation -valium 2-4mg  q8 prn -ambien nightly -monitor for signs of delirium in setting of likely acute infection  Prophylaxis: hepsq  Disposition: admit to med-surg under Dr. Gwendlyn Deutscher   History of Present Illness: Douglas [name] is a 58 y.o. male presenting with increased hesitancy and dysuria after recent extensive surgery (including right partial nephrectomy colectomy, cholecystectomy, appendectomy and removal of fascia overlying the right psoas). Pt is s/p stent and drain placement into right renal pelvis. Surgery done at [facility]. Pt discharge on 10/03/13, followed up with Dr. Everlene Farrier (PCP) two days later with concerns for infection at surgical site (2/2 glue). Pt initially received keflex (for approx 5 day course). But then switched over to topical steroids and antibiotics.   Since Monday patient has noted decreased drainage from his renal drain and reports hx of attempts to urinate with much difficulty. No pain with stream/at penis but suprapubic discomfort upon straining. This was followed by episode of total incontinence. Also reporting chills and rigors for the past 2 weeks. Decreased appetite and gagging on food while trying to eat. Has a 14lb weight loss since the beginning of April. Tmin at home 97, no febrile episodes.    At PCPs office, U/A with TNTC wbcs, reds and positive nitrites. Pt admitted for further w/up and IV antibiotics for  presumed infection. CBC with WBC of 17.8. BMET largely unremarkable.   Review Of Systems: Per HPI with the following additions: Does note some issues with taking high dose oxy Otherwise 12 point review of systems was performed and was unremarkable.  Patient Active Problem List   Diagnosis Date Noted  . Complicated UTI (urinary tract infection) 10/16/2013  . 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 Renal cell cancer 06/27/2012  . Renal calculi   . GERD (gastroesophageal reflux disease)   . Allergic rhinitis   . Osteoarthritis   . Encounter for long-term (current) use of other medications   . Hyperlipidemia   . Tobacco use disorder   . COPD (chronic obstructive pulmonary disease)   . Unspecified vitamin D deficiency   . Hypertension    Past Medical History: Past Medical History  Diagnosis Date  . Renal calculi   . Compression fracture     S/P L-SPINE  . GERD (gastroesophageal reflux disease)   . History of surgical fusion joint     DDD C-SPINE  . Hemoptysis   . Allergic rhinitis   . Osteoarthritis   . Hyperlipidemia   . Encounter for long-term (current) use of other medications   . COPD (chronic obstructive pulmonary disease)     FVC .66  . Tobacco use disorder   . Melanoma in situ   . Unspecified vitamin D deficiency   . Leiomyoma OF SKIN    INCREASED RISK RENAL CELL CA  NEEDS CT OF KIDNEYS EVERY 2 YEARS NEXT DUE  02/06/13  . Hypertension   . Cancer   . Neuromuscular disorder    Past Surgical History: Past Surgical History  Procedure Laterality Date  . Anterior cruciate ligament repair      BILATERAL  . Cervicalfusion      with bone graft from right hip  . Hernia repair    . Hand ligament reconstruction      right hand  . Knee surgery      bilateral-(R)1974,(L)1975  . Robotic assited partial nephrectomy  05/22/2012    Procedure: ROBOTIC ASSITED PARTIAL NEPHRECTOMY;  Surgeon: Alexis Frock, MD;  Location: [facility];  Service:  Urology;  Laterality: Right;  Right Robotic Cyst Decortication and Partial Nephrectomy   . Spine surgery     Social History: History  Substance Use Topics  . Smoking status: Current Every Day Smoker -- 0.50 packs/day for 35 years    Types: Cigarettes  . Smokeless tobacco: Current User     Comment: Pt chewing Nicorette gum currently  . Alcohol Use: 12.6 oz/week    21 Cans of beer per week     Comment: 3 beers daily   Additional social history: current smoker Please also refer to relevant sections of EMR.  Family History: Family History  Problem Relation Age of Onset  . Adopted: Yes   Allergies and Medications: Allergies  Allergen Reactions  . Avelox [Moxifloxacin Hcl In Nacl] Hives, Shortness Of Breath and Swelling   No current facility-administered medications on file prior to encounter.   Current Outpatient Prescriptions on File Prior to Encounter  Medication Sig Dispense Refill  . Aluminum Acetate SOLN Apply topically.      Marland Kitchen atorvastatin (LIPITOR) 10 MG tablet Take 1 tablet (10 mg total) by mouth at bedtime.  90 tablet  0  . b complex vitamins tablet Take 1 tablet by mouth daily.      . bacitracin 500 UNIT/GM ointment Apply 1 application topically 2 (two) times  daily.  15 g  1  . cephALEXin (KEFLEX) 500 MG capsule Take 500 mg by mouth 4 (four) times daily.      . cholecalciferol (VITAMIN D) 1000 UNITS tablet Take 1,000 Units by mouth daily.      . clindamycin (CLEOCIN T) 1 % lotion Apply topically 2 (two) times daily. Applied to the bumps on your anterior chest twice a day  60 mL  0  . diazepam (VALIUM) 2 MG tablet Take 2-4 mg by mouth every 8 (eight) hours as needed for anxiety.      . docusate sodium (COLACE) 100 MG capsule Take 100 mg by mouth daily.      . fluticasone (FLONASE) 50 MCG/ACT nasal spray USE 2 SPRAYS IN EACH NOSTRIL AT BEDTIME  48 g  1  . HYDROcodone-acetaminophen (NORCO) 7.5-325 MG per tablet One in AM  And one half at lunch  135 tablet  0  . losartan  (COZAAR) 50 MG tablet Take 1 tablet (50 mg total) by mouth daily before breakfast.  90 tablet  0  . Multiple Vitamin (MULTIVITAMIN) tablet Take 1 tablet by mouth daily.      . mupirocin ointment (BACTROBAN) 2 % Apply topically 3 (three) times daily.  22 g  5  . nicotine polacrilex (NICORETTE) 4 MG gum Take 4 mg by mouth as needed for smoking cessation.      Marland Kitchen omeprazole (PRILOSEC OTC) 20 MG tablet Take 20 mg by mouth daily.      Marland Kitchen oxyCODONE (OXY IR/ROXICODONE) 5 MG immediate release tablet Take 1-2 tablets by mouth every 4 hours as needed for pain.  60 tablet  0  . OxyCODONE (OXYCONTIN) 20 mg T12A 12 hr tablet Take 2 tablets (40 mg total) by mouth every 12 (twelve) hours.  60 tablet  0  . phenylephrine (SUDAFED PE) 10 MG TABS Take 10 mg by mouth every 4 (four) hours as needed.      . triamcinolone cream (KENALOG) 0.1 % Apply 1 application topically 2 (two) times daily. Applied to the adjacent area around your incision twice a day  90 g  2  . vitamin C (ASCORBIC ACID) 500 MG tablet Take 500 mg by mouth daily.      . vitamin E 400 UNIT capsule Take 400 Units by mouth daily.      Marland Kitchen zolpidem (AMBIEN) 5 MG tablet Take 5 mg by mouth at bedtime as needed for sleep.        Objective: BP 115/70  Pulse 88  Temp(Src) 98.5 F (36.9 C) (Oral)  Resp 18  Ht 6' (1.829 m)  Wt 177 lb (80.287 kg)  BMI 24.00 kg/m2  SpO2 100% Exam: General: awake, alert, talkative; gagging on food when entering the room HEENT: NCAT, MMM, EOMI, PERRL, poor dentition Cardiovascular: RRR, nml s/1, no murmurs Respiratory: CTAB, normal WOB, no retractions or wheezing Abdomen: soft; visible peristalsis, midline scar extending to right flank with visible raised papular rash, tender to light palpation over surgical incision, bulb drain with cloudy pink output GU: minimally enlarged, smooth, nontender Extremities: WWP, moves spontaneously Skin: scattered papules (see above) Neuro: A&OX3; verbose   Labs and Imaging: CBC BMET    Recent Labs Lab 10/16/13 0845  WBC 17.8*  HGB 11.6*  HCT 95.5*    Recent Labs Lab 10/16/13 0841 10/16/13 1520  NA 135  --   K 4.0  --   CL 101  --   CO2 22  --   BUN 12  --  CREATININE 0.95 1.05  GLUCOSE 103*  --   CALCIUM 9.3  --       Langston Masker, MD 10/16/2013, 1:20 PM PGY-1, [facility] pager: [phone], text pages welcome  Upper Level Addendum:  I have seen and evaluated this patient along with Dr. Skeet Simmer and reviewed the above note, making necessary revisions in red.   Tommi Rumps, MD Family Medicine PGY-2

## 2013-10-16 NOTE — Consult Note (Signed)
Urology Consult  CC: Referring physician: Dr. Gwendlyn Deutscher Reason for referral: Recent complex urologic surgery with probably infected urine from bladder and surgical drain.  History of Present Illness: Douglas [name] is a 58 year old male with Renal Cell Carcinoma/hereditary leiomyomatosis who underwent a previous right robotic partial nephrectomy by Dr. Tresa Moore. He was found to have this rare hereditary condition with multiple tumors involving the right kidney and therefore underwent a repeat right partial nephrectomy with associated right colectomy, cholecystectomy, appendectomy and excision of lesions from the area of the psoas fascia on the right side as well. Postoperatively a drain was left in the area of the right kidney and a right renal stent was placed as well. The patient has been home and monitoring the output from his drain. He reports it has been putting out approximately 100 cc every 2-3 hours. It was placed to an ostomy bag but was converted to a JP drain about 2 days ago.  For about 2-3 days he has been experiencing increase in his urinary frequency as well as significant nocturia with urgency and also experienced an episode of urge incontinence. He said the urine in the bulb appeared to be cloudy although his wife believes it has been cloudy for some time before this. He then noted the drain ceased to have any output 3 days ago. He increased his fluid intake and eventually noted return of output through his drain. He has been experiencing what sounds like shaking chills with but no fever. He also reports that he had been taking a great deal of narcotic pain medication and developed some constipation. After he had a bowel movement recently his sense of urgency improved significantly. He presented to his primary care physician's office earlier today and was found to have an elevated white count of 17.8. A voided urine specimen as well as the fluid from his drain revealed nitrate positivity as well as  too numerous to count white blood cells and red blood cells. He was sent to the hospital for antibiotic therapy and admission.   Past Medical History  Diagnosis Date  . Renal calculi   . Compression fracture     S/P L-SPINE  . GERD (gastroesophageal reflux disease)   . History of surgical fusion joint     DDD C-SPINE  . Hemoptysis   . Allergic rhinitis   . Osteoarthritis   . Hyperlipidemia   . Encounter for long-term (current) use of other medications   . COPD (chronic obstructive pulmonary disease)     FVC .66  . Tobacco use disorder   . Melanoma in situ   . Unspecified vitamin D deficiency   . Leiomyoma OF SKIN    INCREASED RISK RENAL CELL CA  NEEDS CT OF KIDNEYS EVERY 2 YEARS NEXT DUE  02/06/13  . Hypertension   . Cancer   . Neuromuscular disorder    Past Surgical History  Procedure Laterality Date  . Anterior cruciate ligament repair      BILATERAL  . Cervicalfusion      with bone graft from right hip  . Hernia repair    . Hand ligament reconstruction      right hand  . Knee surgery      bilateral-(R)1974,(L)1975  . Robotic assited partial nephrectomy  05/22/2012    Procedure: ROBOTIC ASSITED PARTIAL NEPHRECTOMY;  Surgeon: Alexis Frock, MD;  Location: [facility];  Service: Urology;  Laterality: Right;  Right Robotic Cyst Decortication and Partial Nephrectomy   . Spine surgery  Medications:  Prior to Admission:  Prescriptions prior to admission  Medication Sig Dispense Refill  . atorvastatin (LIPITOR) 10 MG tablet Take 1 tablet (10 mg total) by mouth at bedtime.  90 tablet  0  . clindamycin (CLEOCIN T) 1 % lotion Apply topically 2 (two) times daily. Applied to the bumps on your anterior chest twice a day  60 mL  0  . diazepam (VALIUM) 2 MG tablet Take 2-4 mg by mouth every 8 (eight) hours as needed for anxiety.      . docusate sodium (COLACE) 100 MG capsule Take 100 mg by mouth daily.      . fluticasone (FLONASE) 50 MCG/ACT nasal spray USE 2 SPRAYS IN EACH  NOSTRIL AT BEDTIME  48 g  1  . losartan (COZAAR) 50 MG tablet Take 1 tablet (50 mg total) by mouth daily before breakfast.  90 tablet  0  . nicotine polacrilex (NICORETTE) 4 MG gum Take 4 mg by mouth as needed for smoking cessation.      Marland Kitchen omeprazole (PRILOSEC OTC) 20 MG tablet Take 20 mg by mouth daily.      Marland Kitchen oxyCODONE (OXY IR/ROXICODONE) 5 MG immediate release tablet Take 1-2 tablets by mouth every 4 hours as needed for pain.  60 tablet  0  . OxyCODONE (OXYCONTIN) 20 mg T12A 12 hr tablet Take 2 tablets (40 mg total) by mouth every 12 (twelve) hours.  60 tablet  0  . psyllium (METAMUCIL) 58.6 % packet Take 1 packet by mouth daily.      Marland Kitchen senna (SENOKOT) 8.6 MG tablet Take 1 tablet by mouth daily.      Marland Kitchen triamcinolone cream (KENALOG) 0.1 % Apply 1 application topically 2 (two) times daily. Applied to the adjacent area around your incision twice a day  90 g  2  . zolpidem (AMBIEN) 5 MG tablet Take 5 mg by mouth at bedtime as needed for sleep.       Scheduled: . atorvastatin  10 mg Oral QHS  . clindamycin   Topical BID  . [START ON 10/17/2013] docusate sodium  100 mg Oral Daily  . fluticasone  1 spray Each Nare Daily  . heparin  5,000 Units Subcutaneous 3 times per day  . [START ON 10/17/2013] losartan  50 mg Oral QAC breakfast  . pantoprazole  40 mg Oral Daily  . piperacillin-tazobactam (ZOSYN)  IV  3.375 g Intravenous Q8H  . polyethylene glycol  17 g Oral Daily  . senna-docusate  1 tablet Oral QHS  . triamcinolone cream  1 application Topical BID   Continuous: . sodium chloride     XFG:[id number], acetaminophen, diazepam, nicotine polacrilex, oxyCODONE, zolpidem  Allergies:  Allergies  Allergen Reactions  . Avelox [Moxifloxacin Hcl In Nacl] Hives, Shortness Of Breath and Swelling    Family History  Problem Relation Age of Onset  . Adopted: Yes    Social History:  reports that he has been smoking Cigarettes.  He has a 17.5 pack-year smoking history. He uses smokeless tobacco.  He reports that he drinks about 12.6 ounces of alcohol per week. He reports that he does not use illicit drugs.  Review of Systems: Pertinent items are noted in HPI. A comprehensive review of systems was negative except as above as well as a rash on his anterior abdomen where his previous surgical glue was located.  Physical Exam:  Vital signs in last 24 hours: Temp:  [98.1 F (36.7 C)-98.5 F (36.9 C)] 98.5 F (36.9 C) (04/30  1317) Pulse Rate:  [88-109] 88 (04/30 1317) Resp:  [18] 18 (04/30 1317) BP: (106-115)/(64-70) 115/70 mmHg (04/30 1317) SpO2:  [98 %-100 %] 100 % (04/30 1317) Weight:  [80.287 kg (177 lb)-81.103 kg (178 lb 12.8 oz)] 80.287 kg (177 lb) (04/30 1317) General appearance: alert and appears stated age Head: Normocephalic, without obvious abnormality, atraumatic Eyes: conjunctivae/corneas clear. EOM's intact.  Oropharynx: moist mucous membranes Neck: supple, symmetrical, trachea midline Resp: normal respiratory effort Cardio: regular rate and rhythm Back: symmetric, no curvature. ROM normal. No CVA tenderness. GI: soft, slightly tender; bowel sounds normal; no masses,  no organomegaly there is a healing right-sided chevron incision. A drain is exiting the abdomen on the right-hand side. Male genitalia: penis: normal male phallus with no lesions or discharge.Testes: bilaterally descended with no masses or tenderness. no hernias Extremities: extremities normal, atraumatic, no cyanosis or edema Skin: Skin color normal. No visible rashes or lesions Neurologic: Grossly normal  Laboratory Data:   Recent Labs  10/16/13 0845  WBC 17.8*  HGB 11.6*  HCT 95.5*   BMET  Recent Labs  10/16/13 0841  NA 135  K 4.0  CL 101  CO2 22  GLUCOSE 103*  BUN 12  CREATININE 0.95  CALCIUM 9.3   No results found for this basename: LABPT, INR,  in the last 72 hours No results found for this basename: LABURIN,  in the last 72 hours Results for orders placed in visit on  01/06/13  WOUND CULTURE     Status: None   Collection Time    01/06/13 10:15 AM      Result Value Ref Range Status   Gram Stain No WBC Seen   Final   Gram Stain No Squamous Epithelial Cells Seen   Final   Gram Stain No Organisms Seen   Final   Organism ID, Bacteria NO GROWTH 2 DAYS   Final   Creatinine:  Recent Labs  10/16/13 0841  CREATININE 0.95    Imaging: No results found.  Impression/Assessment:  Renal Cell Carcinoma/hereditary leiomyomatosis: He underwent significant surgery up at the [facility] and is scheduled to undergo what sounds like some form of experimental chemotherapy once he fully recovers.  Apparent urine leak from the right kidney: He has a stent in place but does not have a Foley catheter in at this time. His drain is putting out a significant amount and I don't believe this needs to be sent for creatinine at this time as this is almost assuredly urine. It sounds like he was sent home with his drain placed to passive drainage and a bag but this was converted to bulb suction by home health. Until I see the results of his CT scan am going to leave his drain to bulb suction but I believe in the long-term placing this to passive rather than active drainage will hasten closure of his renal leak. In addition I have discussed with him the fact that this tube could potentially be removed sooner if a Foley catheter was indwelling although apparently this was quite uncomfortable for him and therefore I told him I would wait at least until I saw the CT scan results before having a catheter placed but I believe this will be necessary as well.  Urinary tract infection: It would appear that he either got a bladder infection that also has infected the kidney in a retrograde fashion as the fluid from his drain appears infected however I think is more likely that when his drain was to  passive drainage which was an ostomy bag that he very possibly got infection into the system that way and  secondarily infected the bladder which is what resulted in his significant urinary symptoms such as urgency, frequency, urge incontinence and suprapubic discomfort as well as a feeling of incomplete emptying. That has improved after having a bowel movement and I therefore am not going to recommend any form of anticholinergic at this time. He was not having difficulty with bladder spasms from the stent after the surgery and therefore treatment of the infection will likely result in resolution of most if not all of his bladder symptoms. His urine has been cultured and he is on appropriate broad spectrum antibiotics at this time.  Postoperative drains: He has a ureteral stent in place and he told me he is [facility] in July to undergo stent removal under some form of sedation. I don't believe anything needs to be done with the stent at this time. As for his right renal drain the output indicates this is almost certainly a urine leak and I think placing the drain to a passive drainage system and placing a Foley catheter would likely hasten closure of his right renal leak. He asked if local anesthetic could be used for placement of the catheter it was necessary. I told him that that was a possibility.    Plan:  1. A CT scan will be obtained to make sure there is no perinephric fluid present. 2. His drain will remain to bulb suction with monitoring of output overnight. 3. I'm going to hold off for Foley catheter for now but this will likely be needed. 4. Urine and drain fluid cultures are pending. He is currently on broad-spectrum antibiotics.  5. I told him I would inform Dr. Tresa Moore of his admission.  Douglas [name] 10/16/2013, 4:08 PM

## 2013-10-17 ENCOUNTER — Telehealth: Payer: Self-pay

## 2013-10-17 DIAGNOSIS — N390 Urinary tract infection, site not specified: Secondary | ICD-10-CM

## 2013-10-17 DIAGNOSIS — C649 Malignant neoplasm of unspecified kidney, except renal pelvis: Secondary | ICD-10-CM

## 2013-10-17 DIAGNOSIS — D4819 Other specified neoplasm of uncertain behavior of connective and other soft tissue: Secondary | ICD-10-CM

## 2013-10-17 DIAGNOSIS — E43 Unspecified severe protein-calorie malnutrition: Secondary | ICD-10-CM | POA: Insufficient documentation

## 2013-10-17 DIAGNOSIS — D481 Neoplasm of uncertain behavior of connective and other soft tissue: Secondary | ICD-10-CM

## 2013-10-17 LAB — COMPREHENSIVE METABOLIC PANEL
ALBUMIN: 3.2 g/dL — AB (ref 3.5–5.2)
ALT: 48 U/L (ref 0–53)
AST: 46 U/L — ABNORMAL HIGH (ref 0–37)
Alkaline Phosphatase: 65 U/L (ref 39–117)
BUN: 13 mg/dL (ref 6–23)
CALCIUM: 9.4 mg/dL (ref 8.4–10.5)
CO2: 20 mEq/L (ref 19–32)
CREATININE: 1.02 mg/dL (ref 0.50–1.35)
Chloride: 96 mEq/L (ref 96–112)
GFR calc Af Amer: >90 mL/min (ref >90)
GFR calc non Af Amer: 79 mL/min — ABNORMAL LOW (ref >90)
Glucose, Bld: 125 mg/dL — ABNORMAL HIGH (ref 70–99)
Potassium: 3.7 mEq/L (ref 3.7–5.3)
Sodium: 134 mEq/L — ABNORMAL LOW (ref 137–147)
Total Bilirubin: 0.4 mg/dL (ref 0.3–1.2)
Total Protein: 7 g/dL (ref 6.0–8.3)

## 2013-10-17 LAB — CBC WITH DIFFERENTIAL/PLATELET
BASOS ABS: 0 10*3/uL (ref 0.0–0.1)
BASOS PCT: 0 % (ref 0–1)
EOS PCT: 0 % (ref 0–5)
Eosinophils Absolute: 0 10*3/uL (ref 0.0–0.7)
HCT: 33.6 % — ABNORMAL LOW (ref 39.0–52.0)
Hemoglobin: 11.3 g/dL — ABNORMAL LOW (ref 13.0–17.0)
Lymphocytes Relative: 9 % — ABNORMAL LOW (ref 12–46)
Lymphs Abs: 0.8 10*3/uL (ref 0.7–4.0)
MCH: 29.8 pg (ref 26.0–34.0)
MCHC: 33.6 g/dL (ref 30.0–36.0)
MCV: 88.7 fL (ref 78.0–100.0)
MONO ABS: 0.6 10*3/uL (ref 0.1–1.0)
Monocytes Relative: 7 % (ref 3–12)
Neutro Abs: 7.9 10*3/uL — ABNORMAL HIGH (ref 1.7–7.7)
Neutrophils Relative %: 84 % — ABNORMAL HIGH (ref 43–77)
Platelets: 310 10*3/uL (ref 150–400)
RBC: 3.79 MIL/uL — ABNORMAL LOW (ref 4.22–5.81)
RDW: 14 % (ref 11.5–15.5)
WBC: 9.4 10*3/uL (ref 4.0–10.5)

## 2013-10-17 LAB — CREATININE, FLUID (PLEURAL, PERITONEAL, JP DRAINAGE): Creat, Fluid: >50 mg/dL

## 2013-10-17 MED ORDER — PANTOPRAZOLE SODIUM 40 MG PO TBEC
40.0000 mg | DELAYED_RELEASE_TABLET | Freq: Every day | ORAL | Status: DC
  Administered 2013-10-17 – 2013-10-19 (×3): 40 mg via ORAL
  Filled 2013-10-17 (×3): qty 1

## 2013-10-17 MED ORDER — ADULT MULTIVITAMIN W/MINERALS CH
1.0000 | ORAL_TABLET | Freq: Every day | ORAL | Status: DC
  Administered 2013-10-17 – 2013-10-19 (×3): 1 via ORAL
  Filled 2013-10-17 (×5): qty 1

## 2013-10-17 NOTE — Progress Notes (Signed)
[facility]MTS ATTENDING  NOTE Kehinde Eniola,MD I  have seen and examined this patient, reviewed their chart. I have discussed this patient with the resident. I agree with the resident's findings, assessment and care plan. Patient clinically improving,symptoms resolving. vital signs stables. I discussed with patient Dr Nicholes Stairs call to me from [facility] on behalf of Dr Arnetha Massy agreed to give update to Dr Purcell Mouton and or Dr Thereasa Distance, he is aware of his 3 months follow up at [facility]. All his questions were answered today. Urine culture pending,may d/c on oral a/b once we have urine culture report and sensitivity.

## 2013-10-17 NOTE — Progress Notes (Signed)
Subjective:  1 - Right Renal Urine Leak / Nephrocuteneous Fistuala - s/p open right partial nephrectomy, partial colectomy, psoas stripping, chole, appy at [facility] in [place] 09/24/2013 for metastatic renal cell carcinoma. Final path from that surgery showed benign kidney, but focal metastasis in colonic mesentery / psoas fascia. Send home with JP drain and has right JJ stent in place with apparent copious urine output per drain site since surgery, sometimes up to 100cc per hour.   2 -  Hereditary Leiyomyomas and Renal Cell Carcinoma Syndrome - Pt with confirmed [facility], now with primary urologic care at [facility] in [place]. S/p left partial nephrectomy 2013 and subsequent partial 09/2012 with concomitant extirpation as per above. CT this admission without any obvious new foci of disease.   3- Complex Urinary Tract Infection - Pt admitted 4/30 with luekocytosis, several days malaise and pyuria c/w complex UTI. Now on empiric Zosyn, UCX pending.   Today [name] is feeling much better. Less malaise, apatite coming back. Does not like foley, but understands necessary.    Objective: Vital signs in last 24 hours: Temp:  [98 F (36.7 C)-98.5 F (36.9 C)] 98 F (36.7 C) (05/01 0558) Pulse Rate:  [88-95] 88 (05/01 0558) Resp:  [18] 18 (05/01 0558) BP: (115-121)/(70-72) 121/70 mmHg (05/01 0558) SpO2:  [99 %-100 %] 99 % (05/01 0558) Weight:  [80.287 kg (177 lb)] 80.287 kg (177 lb) (04/30 1317) Last BM Date: 10/16/13  Intake/Output from previous day: 04/30 0701 - 05/01 0700 In: 441.7 [P.O.:240; I.V.:101.7; IV Piggyback:100] Out: 1210 [Urine:925; Drains:285] Intake/Output this shift:    General appearance: alert, cooperative, appears stated age and wife at bedside Head: Normocephalic, without obvious abnormality, atraumatic Throat: lips, mucosa, and tongue normal; teeth and gums normal Back: symmetric, no curvature. ROM normal. No CVA tenderness. Resp:  no labored breathing Cardio: Nl rate GI: soft, non-tender; bowel sounds normal; no masses,  no organomegaly Male genitalia: normal, foley c/d/i with yellow urine. Extremities: extremities normal, atraumatic, no cyanosis or edema Pulses: 2+ and symmetric Skin: Skin color, texture, turgor normal. No rashes or lesions Lymph nodes: Cervical, supraclavicular, and axillary nodes normal. Neurologic: Grossly normal Incision/Wound: Prior chevron incision c/d/i. JP with copious uriniferous fluid in JP bulb, this was take OFF suction.   Lab Results:   Recent Labs  10/16/13 0845 10/16/13 1520  WBC 17.8* 15.6*  HGB 11.6* 11.7*  HCT 95.5* 34.4*  PLT  --  308   BMET  Recent Labs  10/16/13 0841 10/16/13 1520  NA 135  --   K 4.0  --   CL 101  --   CO2 22  --   GLUCOSE 103*  --   BUN 12  --   CREATININE 0.95 1.05  CALCIUM 9.3  --    PT/INR No results found for this basename: LABPROT, INR,  in the last 72 hours ABG No results found for this basename: PHART, PCO2, PO2, HCO3,  in the last 72 hours  Studies/Results: Ct Abdomen Pelvis W Contrast  10/17/2013   CLINICAL DATA:  Right-sided discomfort. Trouble urinating. History of right partial nephrectomy with right colectomy, cholecystectomy and appendectomy.  EXAM: CT ABDOMEN AND PELVIS WITH CONTRAST  TECHNIQUE: Multidetector CT imaging of the abdomen and pelvis was performed using the standard protocol following bolus administration of intravenous contrast.  CONTRAST:  161mL OMNIPAQUE IOHEXOL 300 MG/ML  SOLN  COMPARISON:  02/23/2012 and CT from 08/30/2012  FINDINGS: Lung bases are clear.  No evidence for  free air.  Multiple tiny low-density structures in the liver probably represent small cysts. The portal venous system is patent and the gallbladder has been removed. There is new focal low-density in the liver near the gallbladder fossa which could represent focal edema or fluid. This area is as seen on sequence 2, image 27. Normal appearance of  the adrenal glands, spleen and left kidney. There is a Foley catheter in the urinary bladder. There is a right ureter stent that extends from the bladder to the right renal pelvis.  Patient had a partial nephrectomy along the inferior right kidney. There is a right surgical drain that terminates posterior to the right kidney. There is no significant fluid in the right abdomen or around the drain. No evidence for an abscess collection. Prostate measures 5.5 cm in transverse dimension. There are surgical clips around the inferior vena cava and aorta consistent with prior lymph node dissection.  There appears to be focal wall thickening or possibly stool-like material in the right colon near the surgical clips. This is best seen on sequence 2, image 40. The ileal-colonic anastomosis is patent. There is contrast in the small and large bowel and no evidence for an obstruction. Mild stranding or edematous changes in the right abdomen consistent with recent surgery. There appears to be a duodenum diverticulum near the second and third portions of the duodenum. No gross abnormality to the pancreas. Distal common bile duct is mildly dilated and probably secondary to the cholecystectomy.  Stable area of sclerosis along the left ilium and left side of the SI joint. Degenerative disc disease in the lower lumbar spine.  IMPRESSION: Postsurgical changes in the right abdomen consistent with a partial right nephrectomy and right colectomy. There is no evidence for a postoperative fluid collection or abscess.  Focal wall thickening in the right colon near the surgical clips. The anastomosis is patent.  Post cholecystectomy changes. There is a new area of low density involving the central aspect of the left hepatic lobe. This could represent edematous changes from the recent surgery.  Right ureter stent appears to be appropriately positioned.   Electronically Signed   By: Markus Daft M.D.   On: 10/17/2013 08:38     Anti-infectives: Anti-infectives   Start     Dose/Rate Route Frequency Ordered Stop   10/16/13 1600  piperacillin-tazobactam (ZOSYN) IVPB 3.375 g     3.375 g 12.5 mL/hr over 240 Minutes Intravenous Every 8 hours 10/16/13 1415        Assessment/Plan:  1 - Right Renal Urine Leak / Nephrocuteneous Fistuala - difficult problem. Explained to patient and wife need for foley and take JP off suction to maximally favor urine down-stream flow which will hopefully allow urine leak to heal, but may take several weeks. They will keep foley bag lower than kidney and JP and record daily JP output at discharge.   2 -  Hereditary Leiyomyomas and Renal Cell Carcinoma Syndrome - Pt with metastatic cancer. He is planning on undergoing "experimental" chemo via [facility] once healed from recent surgery.   3- Complex Urinary Tract Infection - Improving on empiric therapy. I feel pt would likely need 2 week course of CX-specific ABX given his urine leak. PO ABX at discharge likley sufficient as no abscess by imaging.   4 - Will follow, pt clear for DC from GU perspective once UCX final and we will arrange outpatient f/u in about 2 weeks. Call with questions.    Alexis Frock 10/17/2013

## 2013-10-17 NOTE — Progress Notes (Signed)
Dr Purcell Mouton from [facility] called for update about patient. CT report discussed. Urine culture + for Ecoli but sensitivity pending. He is advised to contact residents tomorrow if necessary for update.

## 2013-10-17 NOTE — Progress Notes (Signed)
A request was made by patient to receive communion, Chaplain made visit to inform pt.that [facility] would be by on today to offer communion. Pt was agreeable to this information.  Charyl Dancer, Chaplain

## 2013-10-17 NOTE — Progress Notes (Signed)
INITIAL NUTRITION ASSESSMENT  DOCUMENTATION CODES Per approved criteria  -Severe malnutrition in the context of acute illness or injury   INTERVENTION: Provide Carnation Instant Breakfast BID Provide Magic Cup ice cream once daily Provide Multivitamin with minerals daily Encourage PO intake  NUTRITION DIAGNOSIS: Inadequate oral intake related to poor appetite as evidenced by 7% weight loss in less than one month.   Goal: Pt to meet >/= 90% of their estimated nutrition needs   Monitor:  PO intake, weight trend, labs  Reason for Assessment: Malnutrition Screening Tool  58 y.o. male  Admitting Dx: Complicated UTI (urinary tract infection)  ASSESSMENT: 58 Y/O M with Pmx of Hereditary Leiomyomatosis Renal Cell CA S/P partial nephrectomy x2,GERD,HLD,HTN was sent to the hospital by his PCP for worsening urinary urgency and hesitancy,chills but no fever, he has also noticed change in his urine color over the past week. Last partial nephrectomy was about 3wks ago at [facility].  Weight history shows pt has lost 13 lbs (7% of his body weight) in the past 2 weeks. Pt states that after surgery the beginning of April he didn't have much of an appetite and he lost from his usual weight of 190 lbs down to 177 lbs. He states he usually eats 3 meals daily plus snacks but, for the past 3-4 weeks he has been eating >50% less than usual.  Pt reports his appetite is better today and he ate 100% of his breakfast. Per nursing notes, pt ate 75% of dinner last night.   Nutrition Focused Physical Exam:  Subcutaneous Fat:  Orbital Region: wnl Upper Arm Region: mild wasting Thoracic and Lumbar Region: NA  Muscle:  Temple Region: mild wasting Clavicle Bone Region: moderate wasting Clavicle and Acromion Bone Region: mild wasting Scapular Bone Region: wnl Dorsal Hand: wnl Patellar Region: mild wasting Anterior Thigh Region: moderate wasting Posterior Calf Region: mild wasting  Edema: none noted  Pt  meets criteria for SEVERE MALNUTRITION in the context of ACUTE ILLNESS/INJURY as evidenced by estimated energy intake <50% of estimated energy needs for > 3 weeks, 7% weight loss in less than one month, and moderate muscle wasting per physical exam.   Height: Ht Readings from Last 1 Encounters:  10/16/13 6' (1.829 m)    Weight: Wt Readings from Last 1 Encounters:  10/16/13 177 lb (80.287 kg)    Ideal Body Weight: 178 lbs  % Ideal Body Weight: 99%  Wt Readings from Last 10 Encounters:  10/16/13 177 lb (80.287 kg)  10/16/13 178 lb 12.8 oz (81.103 kg)  10/05/13 190 lb (86.183 kg)  08/08/13 192 lb (87.091 kg)  06/05/13 194 lb (87.998 kg)  04/29/13 192 lb 6.4 oz (87.272 kg)  03/25/13 189 lb 14.4 oz (86.138 kg)  10/09/12 186 lb 12.8 oz (84.732 kg)  09/10/12 191 lb (86.637 kg)  08/20/12 189 lb (85.73 kg)    Usual Body Weight: 190 lbs  % Usual Body Weight: 93%  BMI:  Body mass index is 24 kg/(m^2).  Estimated Nutritional Needs: Kcal: 2200-2400 Protein: 95-110 grams Fluid: 2.2-2.4 L/day  Skin: closed system drain to RLQ  Diet Order: General  EDUCATION NEEDS: -No education needs identified at this time   Intake/Output Summary (Last 24 hours) at 10/17/13 1009 Last data filed at 10/17/13 0500  Gross per 24 hour  Intake 441.67 ml  Output   1210 ml  Net -768.33 ml    Last BM: 4/30  Labs:   Recent Labs Lab 10/16/13 0841 10/16/13 1520  NA 135  --  K 4.0  --   CL 101  --   CO2 22  --   BUN 12  --   CREATININE 0.95 1.05  CALCIUM 9.3  --   GLUCOSE 103*  --     CBG (last 3)  No results found for this basename: GLUCAP,  in the last 72 hours  Scheduled Meds: . atorvastatin  10 mg Oral QHS  . clindamycin   Topical BID  . docusate sodium  100 mg Oral Daily  . fluticasone  1 spray Each Nare Daily  . heparin  5,000 Units Subcutaneous 3 times per day  . losartan  50 mg Oral QAC breakfast  . pantoprazole  40 mg Oral Daily  . piperacillin-tazobactam (ZOSYN)   IV  3.375 g Intravenous Q8H  . triamcinolone cream  1 application Topical BID    Continuous Infusions: . sodium chloride 10 mL/hr (10/16/13 1850)    Past Medical History  Diagnosis Date  . Renal calculi   . Compression fracture     S/P L-SPINE; pt does not recall this hx on 10/16/2013  . GERD (gastroesophageal reflux disease)   . History of surgical fusion joint     DDD C-SPINE  . Hemoptysis   . Allergic rhinitis   . Hyperlipidemia   . Encounter for long-term (current) use of other medications   . COPD (chronic obstructive pulmonary disease)     FVC .66  . Tobacco use disorder   . Melanoma in situ   . Unspecified vitamin D deficiency   . Leiomyoma OF SKIN    INCREASED RISK RENAL CELL CA  NEEDS CT OF KIDNEYS EVERY 2 YEARS NEXT DUE  02/06/13  . Hypertension   . Neuromuscular disorder   . History of blood transfusion 09/2013    "think so; not sure; related to big OR"  . Osteoarthritis   . Arthritis     "all over my body" (10/16/2013)  . Chronic back pain     "neck to lower back" (10/16/2013)  . Depression     "situational since 09/22/2013 OR"  . Melanoma of back   . 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 Renal cell carcinoma     type 2; papillary    Past Surgical History  Procedure Laterality Date  . Anterior cruciate ligament repair Bilateral [id number]    [phone]  . Anterior cervical decomp/discectomy fusion  1992  . Hand ligament reconstruction Right   . Robotic assited partial nephrectomy  05/22/2012    Procedure: ROBOTIC ASSITED PARTIAL NEPHRECTOMY;  Surgeon: Alexis Frock, MD;  Location: [facility];  Service: Urology;  Laterality: Right;  Right Robotic Cyst Decortication and Partial Nephrectomy   . Spine surgery    . Right colectomy Right 09/22/2013  . Cholecystectomy  09/22/2013    "had tumors in it"  . Appendectomy  09/22/2013  . Removal of fascia overlying the right psoas Right 09/22/2013  . Partial nephrectomy Right 09/22/2013  . Renal artery  stent Right 09/22/2013  . Lymph node dissection Right 09/22/2013    "in the area of the kidney; they were clean"  . Harvest bone graft Right 1992    "hip; for neck fusion"  . Inguinal hernia repair Right 2004  . Ligament repair Right ~ 2001    "little finger"  . Melanoma excision  ~ 2007    "off my back"    Pryor Ochoa RD, LDN Inpatient Clinical Dietitian Pager: [phone] After Hours Pager: [phone]

## 2013-10-17 NOTE — Telephone Encounter (Signed)
Pt is currently in the hospital and saw Dr. Tresa Moore this morning. Pt text Dr. Everlene Farrier and asked if we could call Dr. Zettie Pho nurse and see if Dr. Tresa Moore could call pt on his cell later because he had more questions. Spoke with Dr. Zettie Pho nurse. He is in surgery this morning but will replay the message to him for me

## 2013-10-17 NOTE — Progress Notes (Signed)
Family Medicine Teaching Service Daily Progress Note Intern Pager: [phone]  Patient name: Douglas [name] Medical record number: [id number] Date of birth: Jun 03, 1956 Age: 58 y.o. Gender: male  Primary Care Provider: Jenny Reichmann, MD Consultants: urology Code Status: full  Pt Overview and Major Events to Date:   Assessment and Plan: Douglas [name] is a 58 y.o. male presenting with hesit . PMH is significant for hereditary leiomyomatosis and renal cell cancer.   #Urology; Renal Cell Carcinoma/hereditary leiomyomatosis; Hesitancy and dysuria for several days duration in setting of recent right partial nephrectomy and right colectomy, now with renal stent in place; Most likely pyelo/uti (elevated white count, rigors and chills, u/a with TNTC rbcs and wbc current stent and nephrostomy drain as possible source of infection. However no signs of abscess of cyst formation on imaging) vs displaced drain or stent (CT demonstrating proper positioning) vs neurogenic bladder (recent extensive exploratory surgery involving muscle manipulation, near L-spine) vs prostatitis (mildly enlarged on exam, but non tender with no nodules). Pt otherwise well appearing. WBC trending down 17.8 -> 15.6. S/p placement of foley cath last night.  -vs per floor protocol  - blood cultures X2 pending -urine culture  -zosyn 3.375 q6, consider broadening with addition of vanc -oxycodone 5mg  q4 (avoid over medication)  -tylenol 650prn mild pain  -track i/os  -urology consulted, appreciate recs, possible need for stent removal (as nidus for infection?) -[facility] surgeon Dr. [name] to be updated as needed; Urologist from [place] spoke with Dr. Gwendlyn Deutscher yesterday and rec f/up in 3 months for stent replacement  #ID: concern for pyelo/abscess (see above); topical skin reaction to surgical glue; s/p 5 day course of keflex; drain site clean  -cultures and antibiotics as above  -topical clinda and kenalog cream  -wound care    #CV: HTN; on home lipitor and cozaar; normotensive on admission  -restart home meds in house  -vs per floor protocol   #FEN/GI: constipation/recent weight loss (severe malnutrition); pt reporting recent gaging on food; may be related to anxiety around recent illness vs delayed gut emptying post surgery vs GER; no signs of obstruction in oral cavity; now with diarrhea will d/c stool meds -reg diet  -consider nutrition eval  -if continues to gag consider barium swallow   #Pulm/HEENT: COPD; allergic rhinitis; good air movement on admission; no home medications or O2 requirement  -monitor O2 sats on vitals  -smoking cessation  -flonase BID   #Psych/Social: Tobacco abuse; anxiety  -nicorette gum  -smoking cessation  -valium 2-4mg  q8 prn  -ambien nightly  -monitor for signs of delirium in setting of likely acute infection   Prophylaxis: hepsq  Disposition: further results of cultures; narrowing of abx regimen, urology recommendations  Subjective: Feels much improve this morning; minimal rest overnight; continues to have a lot of stress anxiety around recent surgery and health of wife and mother  Objective: Temp:  [98 F (36.7 C)-98.5 F (36.9 C)] 98 F (36.7 C) (05/01 0558) Pulse Rate:  [88-95] 88 (05/01 0558) Resp:  [18] 18 (05/01 0558) BP: (115-121)/(70-72) 121/70 mmHg (05/01 0558) SpO2:  [99 %-100 %] 99 % (05/01 0558) Weight:  [177 lb (80.287 kg)] 177 lb (80.287 kg) (04/30 1317) Physical Exam: General: awake, alert, talkative; gagging on food when entering the room  HEENT: NCAT, MMM, EOMI, PERRL, poor dentition  Cardiovascular: RRR, nml s/1, no murmurs  Respiratory: CTAB, normal WOB, no retractions or wheezing  Abdomen: soft; visible peristalsis, midline scar extending to right flank with visible  raised papular rash, tender to light palpation over surgical incision, bulb drain with cloudy pink output  GU: minimally enlarged, smooth, nontender  Extremities: WWP, moves  spontaneously  Skin: scattered papules (see above)  Neuro: A&OX3; verbose   Laboratory:  Recent Labs Lab 10/16/13 0845 10/16/13 1520  WBC 17.8* 15.6*  HGB 11.6* 11.7*  HCT 95.5* 34.4*  PLT  --  308    Recent Labs Lab 10/16/13 0841 10/16/13 1520  NA 135  --   K 4.0  --   CL 101  --   CO2 22  --   BUN 12  --   CREATININE 0.95 1.05  CALCIUM 9.3  --   GLUCOSE 103*  --     Bcx pending Ucx pending  Imaging/Diagnostic Tests: CT read pending  Langston Masker, MD 10/17/2013, 8:20 AM PGY-1, [place] Intern pager: [phone], text pages welcome

## 2013-10-18 DIAGNOSIS — I10 Essential (primary) hypertension: Secondary | ICD-10-CM

## 2013-10-18 LAB — COMPREHENSIVE METABOLIC PANEL
ALK PHOS: 65 U/L (ref 39–117)
ALT: 82 U/L — ABNORMAL HIGH (ref 0–53)
AST: 51 U/L — AB (ref 0–37)
Albumin: 3.2 g/dL — ABNORMAL LOW (ref 3.5–5.2)
BILIRUBIN TOTAL: 0.2 mg/dL — AB (ref 0.3–1.2)
BUN: 12 mg/dL (ref 6–23)
CHLORIDE: 101 meq/L (ref 96–112)
CO2: 21 mEq/L (ref 19–32)
CREATININE: 1.15 mg/dL (ref 0.50–1.35)
Calcium: 9.5 mg/dL (ref 8.4–10.5)
GFR calc Af Amer: 79 mL/min — ABNORMAL LOW (ref >90)
GFR calc non Af Amer: 68 mL/min — ABNORMAL LOW (ref >90)
Glucose, Bld: 106 mg/dL — ABNORMAL HIGH (ref 70–99)
Potassium: 3.9 mEq/L (ref 3.7–5.3)
Sodium: 139 mEq/L (ref 137–147)
Total Protein: 6.8 g/dL (ref 6.0–8.3)

## 2013-10-18 LAB — URINE CULTURE: Colony Count: >=100000

## 2013-10-18 LAB — CBC WITH DIFFERENTIAL/PLATELET
Basophils Absolute: 0 10*3/uL (ref 0.0–0.1)
Basophils Relative: 0 % (ref 0–1)
Eosinophils Absolute: 0.1 10*3/uL (ref 0.0–0.7)
Eosinophils Relative: 1 % (ref 0–5)
HEMATOCRIT: 32.9 % — AB (ref 39.0–52.0)
HEMOGLOBIN: 11.1 g/dL — AB (ref 13.0–17.0)
LYMPHS PCT: 27 % (ref 12–46)
Lymphs Abs: 1.7 10*3/uL (ref 0.7–4.0)
MCH: 29.7 pg (ref 26.0–34.0)
MCHC: 33.7 g/dL (ref 30.0–36.0)
MCV: 88 fL (ref 78.0–100.0)
MONO ABS: 0.6 10*3/uL (ref 0.1–1.0)
Monocytes Relative: 10 % (ref 3–12)
Neutro Abs: 3.8 10*3/uL (ref 1.7–7.7)
Neutrophils Relative %: 62 % (ref 43–77)
Platelets: 321 10*3/uL (ref 150–400)
RBC: 3.74 MIL/uL — ABNORMAL LOW (ref 4.22–5.81)
RDW: 14 % (ref 11.5–15.5)
WBC: 6.2 10*3/uL (ref 4.0–10.5)

## 2013-10-18 MED ORDER — DIPHENHYDRAMINE HCL 25 MG PO CAPS
25.0000 mg | ORAL_CAPSULE | ORAL | Status: DC | PRN
  Administered 2013-10-19: 25 mg via ORAL
  Filled 2013-10-18: qty 1

## 2013-10-18 NOTE — Progress Notes (Signed)
Family Medicine Teaching Service Daily Progress Note Intern Pager: [phone]  Patient name: Douglas [name] Medical record number: [id number] Date of birth: 09-07-1955 Age: 58 y.o. Gender: male  Primary Care Provider: Jenny Reichmann, MD Consultants: urology Code Status: full  Pt Overview and Major Events to Date:   Assessment and Plan: Douglas [name] is a 58 y.o. male presenting with hesit . PMH is significant for hereditary leiomyomatosis and renal cell cancer.   #Urology; Renal Cell Carcinoma/hereditary leiomyomatosis; Hesitancy and dysuria for several days duration in setting of recent right partial nephrectomy and right colectomy, now with renal stent in place; Most likely pyelo/uti (elevated white count, rigors and chills, u/a with TNTC rbcs and wbc current stent and nephrostomy drain as possible source of infection. However no signs of abscess of cyst formation on imaging) vs displaced drain or stent (CT demonstrating proper positioning) vs neurogenic bladder (recent extensive exploratory surgery involving muscle manipulation, near L-spine) vs prostatitis (mildly enlarged on exam, but non tender with no nodules). Pt otherwise well appearing. WBC have returned to normal . S/p placement of foley cath.  -vs per floor protocol  - blood cultures X2 NGTD - urine culture positive for E coli pending speciation - on zosyn, Urology recs are 2 weeks course of specific abx given his urine leak and discharged with foley cath.  - pain control with tylenol and Oxycodone PRN - [facility] surgeon Dr. [name] to be updated as needed; Urologist from [place] spoke with Dr. Gwendlyn Deutscher yesterday and rec f/up in 3 months for stent replacement  #ID: concern for pyelo/abscess (see above); topical skin reaction to surgical glue; s/p 5 day course of keflex; drain site clean  -cultures and antibiotics as above  -topical clinda and kenalog cream  -wound care   #CV: HTN; on home lipitor and cozaar; normotensive on  admission  -restart home meds in house  -vs per floor protocol   #FEN/GI: (severe malnutrition); - nutrition eval   #Pulm/HEENT: COPD; allergic rhinitis; good air movement on admission; no home medications or O2 requirement  -monitor O2 sats on vitals  -smoking cessation  -flonase BID   #Psych/Social: Tobacco abuse; anxiety  -nicorette gum  -smoking cessation  -valium 2-4mg  q8 prn  -ambien nightly   Prophylaxis: hepsq  Disposition: Pending narrowing of abx regimen, after that pt can be discharge with 2 weeks of abx and urology outpatient f/u. Please discharge with foley.   Subjective: Feels much improve this morning, has been able to tolerate diet. Afebrile.   Objective: Temp:  [98.2 F (36.8 C)-99.1 F (37.3 C)] 98.3 F (36.8 C) (05/02 0844) Pulse Rate:  [68-87] 68 (05/02 0844) Resp:  [16-19] 16 (05/02 0844) BP: (100-113)/(66-77) 113/77 mmHg (05/02 0844) SpO2:  [97 %-100 %] 98 % (05/02 0844) Physical Exam: General: awake, alert, talkative; gagging on food when entering the room  HEENT: NCAT, MMM, EOMI, PERRL, poor dentition  Cardiovascular: RRR, nml s/1, no murmurs  Respiratory: CTAB, normal WOB, no retractions or wheezing  Abdomen: soft; visible peristalsis, midline scar extending to right flank with visible raised papular rash, tender to light palpation over surgical incision, bulb drain with cloudy pink output  GU: minimally enlarged, smooth, nontender  Extremities: WWP, moves spontaneously  Skin: scattered papules (see above)  Neuro: A&OX3; verbose  Laboratory:  Recent Labs Lab 10/16/13 1520 10/17/13 1035 10/18/13 0600  WBC 15.6* 9.4 6.2  HGB 11.7* 11.3* 11.1*  HCT 34.4* 33.6* 32.9*  PLT 308 310 321    Recent  Labs Lab 10/16/13 0841 10/16/13 1520 10/17/13 1035 10/18/13 0600  NA 135  --  134* 139  K 4.0  --  3.7 3.9  CL 101  --  96 101  CO2 22  --  20 21  BUN 12  --  13 12  CREATININE 0.95 1.05 1.02 1.15  CALCIUM 9.3  --  9.4 9.5  PROT  --   --   7.0 6.8  BILITOT  --   --  0.4 0.2*  ALKPHOS  --   --  65 65  ALT  --   --  48 82*  AST  --   --  46* 51*  GLUCOSE 103*  --  125* 106*    Bcx negative in 24h Ucx positive for e coli pending speciation.   Imaging/Diagnostic Tests: 10/16/13 CT  IMPRESSION:  Postsurgical changes in the right abdomen consistent with a partial right nephrectomy and right colectomy. There is no evidence for a  postoperative fluid collection or abscess. Focal wall thickening in the right colon near the surgical clips.  The anastomosis is patent. Post cholecystectomy changes. There is a new area of low density involving the central aspect of the left hepatic lobe. This could represent edematous changes from the recent surgery. Right ureter stent appears to be appropriately positioned.   [place], MD 10/18/2013, 9:24 AM PGY-3, [place] Intern pager: [phone], text pages welcome

## 2013-10-18 NOTE — Progress Notes (Signed)
[facility]MTS Attending Note  I personally saw and evaluated the patient. The plan of care was discussed with the resident team. I agree with the assessment and plan as documented by the resident.   Symptoms improving on Zosyn. Urine culture grew greater than 100,000 Ecoli, awaiting sensitivities to narrow antibiotic coverage, likely home tomorrow if ok with Urology  Dossie Arbour MD

## 2013-10-18 NOTE — Progress Notes (Signed)
NUTRITION FOLLOW UP  Intervention:   Provide Carnation Instant Breakfast BID  Provide Magic Cup ice cream once daily  Provide Multivitamin with minerals daily  Encourage PO intake   NUTRITION DIAGNOSIS:  Inadequate oral intake related to poor appetite as evidenced by 7% weight loss in less than one month.   Goal:  Pt to meet >/= 90% of their estimated nutrition needs   Monitor:  PO intake, weight trend, labs  Assessment:   58 Y/O M with Pmx of Hereditary Leiomyomatosis Renal Cell CA S/P partial nephrectomy x2,GERD,HLD,HTN was sent to the hospital by his PCP for worsening urinary urgency and hesitancy,chills but no fever, he has also noticed change in his urine color over the past week. Last partial nephrectomy was about 3wks ago at [facility].  Pt assessed by RD 10/17/2013.  This RD met with pt to confirm all interventions remain in place and are being provided to pt. Pt states he has not received [employer] and would like to have one with dinner.   RD discussed home nutrition.  Pt and wife are without questions at this time.  Appropriate interventions remain in place.   Height: Ht Readings from Last 1 Encounters:  10/16/13 6' (1.829 m)    Weight Status:   Wt Readings from Last 1 Encounters:  10/16/13 177 lb (80.287 kg)    Re-estimated needs:  Kcal: 2200-2400  Protein: 95-110 grams  Fluid: 2.2-2.4 L/day  Skin: no issues noted  Diet Order: General   Intake/Output Summary (Last 24 hours) at 10/18/13 1612 Last data filed at 10/18/13 1343  Gross per 24 hour  Intake    990 ml  Output   2660 ml  Net  -1670 ml    Last BM: 5/2   Labs:   Recent Labs Lab 10/16/13 0841 10/16/13 1520 10/17/13 1035 10/18/13 0600  NA 135  --  134* 139  K 4.0  --  3.7 3.9  CL 101  --  96 101  CO2 22  --  20 21  BUN 12  --  13 12  CREATININE 0.95 1.05 1.02 1.15  CALCIUM 9.3  --  9.4 9.5  GLUCOSE 103*  --  125* 106*    CBG (last 3)  No results found for this basename:  GLUCAP,  in the last 72 hours  Scheduled Meds: . atorvastatin  10 mg Oral QHS  . docusate sodium  100 mg Oral Daily  . fluticasone  1 spray Each Nare Daily  . heparin  5,000 Units Subcutaneous 3 times per day  . losartan  50 mg Oral QAC breakfast  . multivitamin with minerals  1 tablet Oral Daily  . pantoprazole  40 mg Oral Daily  . piperacillin-tazobactam (ZOSYN)  IV  3.375 g Intravenous Q8H  . triamcinolone cream  1 application Topical BID    Continuous Infusions: . sodium chloride 10 mL/hr (10/16/13 1850)    Brynda Greathouse, MS RD LDN Clinical Inpatient Dietitian Pager: [phone] Weekend/After hours pager: [phone]

## 2013-10-19 DIAGNOSIS — J449 Chronic obstructive pulmonary disease, unspecified: Secondary | ICD-10-CM

## 2013-10-19 DIAGNOSIS — J4489 Other specified chronic obstructive pulmonary disease: Secondary | ICD-10-CM

## 2013-10-19 LAB — COMPREHENSIVE METABOLIC PANEL
ALBUMIN: 3 g/dL — AB (ref 3.5–5.2)
ALK PHOS: 56 U/L (ref 39–117)
ALT: 73 U/L — AB (ref 0–53)
AST: 29 U/L (ref 0–37)
BUN: 13 mg/dL (ref 6–23)
CALCIUM: 9.2 mg/dL (ref 8.4–10.5)
CO2: 22 mEq/L (ref 19–32)
Chloride: 102 mEq/L (ref 96–112)
Creatinine, Ser: 1.06 mg/dL (ref 0.50–1.35)
GFR calc Af Amer: 88 mL/min — ABNORMAL LOW (ref >90)
GFR calc non Af Amer: 76 mL/min — ABNORMAL LOW (ref >90)
Glucose, Bld: 109 mg/dL — ABNORMAL HIGH (ref 70–99)
POTASSIUM: 3.9 meq/L (ref 3.7–5.3)
Sodium: 139 mEq/L (ref 137–147)
Total Bilirubin: <0.2 mg/dL — ABNORMAL LOW (ref 0.3–1.2)
Total Protein: 6.4 g/dL (ref 6.0–8.3)

## 2013-10-19 LAB — CBC WITH DIFFERENTIAL/PLATELET
BASOS ABS: 0 10*3/uL (ref 0.0–0.1)
BASOS PCT: 0 % (ref 0–1)
EOS ABS: 0.3 10*3/uL (ref 0.0–0.7)
EOS PCT: 5 % (ref 0–5)
HCT: 30.3 % — ABNORMAL LOW (ref 39.0–52.0)
Hemoglobin: 10.3 g/dL — ABNORMAL LOW (ref 13.0–17.0)
Lymphocytes Relative: 31 % (ref 12–46)
Lymphs Abs: 2 10*3/uL (ref 0.7–4.0)
MCH: 29.9 pg (ref 26.0–34.0)
MCHC: 34 g/dL (ref 30.0–36.0)
MCV: 87.8 fL (ref 78.0–100.0)
Monocytes Absolute: 0.7 10*3/uL (ref 0.1–1.0)
Monocytes Relative: 11 % (ref 3–12)
NEUTROS PCT: 53 % (ref 43–77)
Neutro Abs: 3.5 10*3/uL (ref 1.7–7.7)
PLATELETS: 316 10*3/uL (ref 150–400)
RBC: 3.45 MIL/uL — ABNORMAL LOW (ref 4.22–5.81)
RDW: 14.2 % (ref 11.5–15.5)
WBC: 6.5 10*3/uL (ref 4.0–10.5)

## 2013-10-19 MED ORDER — CEPHALEXIN 500 MG PO CAPS
500.0000 mg | ORAL_CAPSULE | Freq: Four times a day (QID) | ORAL | Status: DC
  Administered 2013-10-19 (×2): 500 mg via ORAL
  Filled 2013-10-19 (×2): qty 1

## 2013-10-19 MED ORDER — PHENAZOPYRIDINE HCL 100 MG PO TABS: 100.0000 mg | ORAL_TABLET | Freq: Three times a day (TID) | ORAL | Status: DC | PRN

## 2013-10-19 MED ORDER — OXYCODONE HCL 5 MG PO TABS
5.0000 mg | ORAL_TABLET | Freq: Once | ORAL | Status: AC
  Administered 2013-10-19: 5 mg via ORAL

## 2013-10-19 MED ORDER — CEPHALEXIN 500 MG PO CAPS: 500.0000 mg | ORAL_CAPSULE | Freq: Four times a day (QID) | ORAL | Status: DC

## 2013-10-19 NOTE — Discharge Instructions (Signed)
Douglas [name], Douglas [name] were seen in the hospital for increased hesitancy with urination and associated discomfort. We drew blood and urine cultures and obtained imaging of your abdomen. We were pleased to see that you did not have an abscess or collection of pus that needed to be drained and that your stent and drain looked like they were in the proper position. We placed you on broad antibiotics for coverage of this infection. Once we knew what specific bacteria was growing we were able to transition you over to the oral form of that medication.You should follow up closely with urology in 2 weeks time for recheck and about 3 months time with [facility] for changing your stent. Should you have worsening fevers or chills prior to then or return of your symptoms please call your PCP or 911 immediately as these could be signs of a medical emergency. The following are signs to call the urologist immediately: Your catheter comes out; do not try to replace it yourself You have a temperature of 101 F (38.3 C) or higher You have a decrease in the amount of urine you are making You have foul-smelling urine You have bright red blood or large blood clots in your urine You have abdominal pain and no urine in your catheter bag  **Daily Catheter Care**  Perform catheter care every day. You can do this while in the shower, but not while taking a tub bath.  You will need the following supplies:  Mild soap, such as Air traffic controller (not one already used for bathing) or a 4x 4 piece of gauze 1 Cath-Secure Night drainage bag 2 alcohol swabs Wash your hands thoroughly with soap and water. Using mild soap and water, clean your genital area. Men should retract the foreskin, if needed, and clean the area, including the penis. Clean your urinary opening, which is where the catheter enters your body. Clean the catheter from where it enters your body and then down, away from your body. Hold the catheter at the  point it enters your body so that you don't put tension on it. Rinse the area well and dry it gently. Use a Cath-Secure to keep the catheter from moving. Changing the Drainage Bag  You will change your drainage bag twice a day.  In the morning after you shower, change the night bag to the leg bag At night before you go to bed, change the leg bag to the night bag Wash your hands thoroughly with soap and water. Empty the urine from the drainage bag into the toilet before you change it. Pinch off the catheter with your fingers and disconnect the used bag. Wipe the end of the catheter using an alcohol pad. Wipe the connector on the new bag using the second alcohol pad. Connect the clean bag to the catheter and release your finger pinch. Check all connections. Straighten any kinks or twists in the tubing. Caring for the Leg Bag  Always wear the leg bag below your knee. This will help it drain. Keep the leg bag secure with the Velcro straps. If the straps leave a mark on your leg, they are too tight and should be loosened. Leaving the straps too tight can decrease your circulation and lead to blood clots. Empty the leg bag through the spout at the bottom every 2 to 4 hours, as needed. Do not let the bag become completely full. Do not lie down for longer than 2 hours while you are wearing the leg  bag. Caring for the Night Bag  Always keep the night bag below the level of your bladder. To hang your night bag while you sleep, place a clean plastic bag inside of a wastebasket. Hang the night bag on the inside of the wastebasket. Cleaning the Drainage Bags  Wash your hands thoroughly with soap and water. Rinse the equipment with cool water. Do not use hot water because it can damage the plastic equipment. Wash the equipment with a mild liquid detergent (e.g., Ivory) and rinse with cool water. To decrease odor, fill the bag halfway with a mixture of 1 part white vinegar and 3 parts water. Shake  the bag and let it sit for 15 minutes. Rinse the bag with cool water and hang it up to dry. Preventing Infection  Keep the drainage bag below the level of your bladder and off the floor at all times. Keep the catheter secured to your thigh to prevent it from moving. Do not lie on or block the flow of urine in the tubing. Shower daily to keep the catheter clean. Clean your hands before and after touching the catheter or bag. The spout of the drainage bag should never touch the side of the toilet or any emptying container. Special Points  You may see some blood or urine around where the catheter enters your body, especially when walking or having a bowel movement. This is normal, as long as there is urine draining into the drainage bag. Drink 1 to 2 glasses of liquids every 2 hours while you're awake.

## 2013-10-19 NOTE — Progress Notes (Signed)
Discharge instructions explained and given to pt. And wife.  Both pt. And wife verbalized understanding of all orders/instructions.  All questions answered.  IV removed by NT.  Pt. Sent home with urinary catheter leg bag and also given night bag.  Pt. Ordered to pick prescriptions up at pharmacy.  Pt.  In stable condition for discharge.  Pt. Discharged to home with wife. Syliva Overman

## 2013-10-19 NOTE — Discharge Summary (Signed)
FMTS Attending Note  I personally saw and evaluated the patient. The plan of care was discussed with the resident team. I agree with the assessment and plan as documented by the resident.   Patient transitioned to oral antibiotics. Stable for discharge with Urology follow up as outpatient.   Dossie Arbour MD

## 2013-10-19 NOTE — Discharge Summary (Signed)
Douglas [name]  Patient name: Douglas [name] Medical record number: [id number] Date of birth: 09/03/1955 Age: 58 y.o. Gender: male Date of Admission: 10/16/2013  Date of Discharge: 10/20/13 Admitting Physician: Andrena Mews, MD  Primary Care Provider: Jenny Reichmann, MD Consultants: urology  Indication for Hospitalization: hesitancy, incontinence  Discharge Diagnoses/Problem List:  [place] UTI In dwelling stent HTN Malnutrition COPD Allergic rhinitis Anxiety Tobacco abuse  Disposition: stable to home  Discharge Condition: improved  Discharge Exam:  BP 116/70  Pulse 73  Temp(Src) 98.5 F (36.9 C) (Oral)  Resp 16  Ht 6' (1.829 m)  Wt 177 lb (80.287 kg)  BMI 24.00 kg/m2  SpO2 100% General: awake, alert, talkative HEENT: NCAT, MMM, EOMI, PERRL Cardiovascular: RRR, nml s/1, no murmurs  Respiratory: CTAB, normal WOB, no retractions or wheezing  Abdomen: soft; visible peristalsis, midline scar extending to right flank with visible raised papular rash, tender to light palpation over surgical incision, bulb drain with minimal outpt; foley cath in place Extremities: WWP, moves spontaneously  Skin: scattered papules (see above)  Neuro: A&OX3; verbose  Brief Hospital Course:  Douglas [name] is a 58 y.o. male presenting with hesit . PMH is significant for hereditary leiomyomatosis and renal cell cancer.   #Urology:Hx of Renal Cell Carcinoma/hereditary leiomyomatosis. Pt presenting to PCP with increased urinary urgency, hesitancy and chills along with decreased change in drain output and cloudy appearance in the last week. Sx in setting of recent right partial nephrectomy and right colectomy and placement of renal stent. Admitted to hospital for concerns of pyelo/uti, afebrile on admission and otherwise well appearing. Urology consulted, placed foley cath for possible urine leak s/p procedure. Blood, urine drain cultures obtained.  Placed on zosyn, given instrumentation. U/A with WBC/RBCs TNTC. CBC with WBC of 17. Obtained CT abdomen pelvis which was negative for abscess or drain/stent displacement. No signs of prostatitis on exam. Pain controlled with oxy/tylenol prn. Urine cultures growing ecoli resistant only to amp and bactrim. Pt switched to keflex QID to complete 14 day course total abx. Clinically improved by time of discharge with down trending wbc to 9, afebrile. Will follow up in 2 weeks for further urology eval. [facility] aware of patient's admission. Agree with f/up in 3 months time (as already scheduled) for stent replacement.   #ID/Skin: (as above in addition to skin) Pt with prior topical skin reaction to surgical glue; s/p 5 day course of keflex. Stopped approx 7-10 days prior to admission. Placed on topical clinda, kenalog cream. Pt continued on cutaneous treatments in house. Wound care per nursing. Drain site appeared clean and intact. No dehiscence from surgical scar.   #CV: HTN; On home lipitor and cozaar; Normotensive throughout admission. Continued on home medications.   #FEN/GI: (severe malnutrition): Pt initially complaining of choking on food; On PPI home med; started on formulary substitute here. Nutrition consulted here, provided information of supplements including carnation instant breakfast, multivitamin. Est approx 7% wt loss in <31month. No further questions per pt and wife.  #Pulm/HEENT: hx of COPD; allergic rhinitis; Had good air movement throughout admission; no home medications or O2 requirement. Provided smoking cessation, home med flonase BID  #Psych/Social: Hx of current Tobacco abuse and anxiety; Significant anxiety surrounding recent surgery and wife's diagnosis. Placed on nicorette gum, counseled on smoking cessation. Continued on home valium and ambien. Consider mediation adjustment as outpt given meds being on BEERs list.   Issues for Follow Up:  1. Outpt follow up with  urology in  2 weeks 2. 3 month follow up with [facility] for stent change 3. Completion of abx course (14 days); relief of dysuria   Significant Procedures: none  Significant Labs and Imaging:   Recent Labs Lab 10/17/13 1035 10/18/13 0600 10/19/13 0535  WBC 9.4 6.2 6.5  HGB 11.3* 11.1* 10.3*  HCT 33.6* 32.9* 30.3*  PLT 310 321 316    Recent Labs Lab 10/16/13 0841 10/16/13 1520 10/17/13 1035 10/18/13 0600 10/19/13 0535  NA 135  --  134* 139 139  K 4.0  --  3.7 3.9 3.9  CL 101  --  96 101 102  CO2 22  --  20 21 22   GLUCOSE 103*  --  125* 106* 109*  BUN 12  --  13 12 13   CREATININE 0.95 1.05 1.02 1.15 1.06  CALCIUM 9.3  --  9.4 9.5 9.2  ALKPHOS  --   --  65 65 56  AST  --   --  46* 51* 29  ALT  --   --  48 82* 73*  ALBUMIN  --   --  3.2* 3.2* 3.0*   Bcx neg X48hrs Ucx ecoli (resistant to amp bactrim)  10/16/13 CT  IMPRESSION:  Postsurgical changes in the right abdomen consistent with a partial right nephrectomy and right colectomy. There is no evidence for a  postoperative fluid collection or abscess. Focal wall thickening in the right colon near the surgical clips.  The anastomosis is patent. Post cholecystectomy changes. There is a new area of low density involving the central aspect of the left hepatic lobe. This could represent edematous changes from the recent surgery. Right ureter stent appears to be appropriately positioned   Results/Tests Pending at Time of Discharge: blood cultures (finalized read)  Discharge Medications:    Medication List         atorvastatin 10 MG tablet  Commonly known as:  LIPITOR  Take 1 tablet (10 mg total) by mouth at bedtime.     cephALEXin 500 MG capsule  Commonly known as:  KEFLEX  Take 1 capsule (500 mg total) by mouth every 6 (six) hours.     clindamycin 1 % lotion  Commonly known as:  CLEOCIN T  Apply topically 2 (two) times daily. Applied to the bumps on your anterior chest twice a day     diazepam 2 MG tablet  Commonly known as:   VALIUM  Take 2-4 mg by mouth every 8 (eight) hours as needed for anxiety.     docusate sodium 100 MG capsule  Commonly known as:  COLACE  Take 100 mg by mouth daily.     fluticasone 50 MCG/ACT nasal spray  Commonly known as:  FLONASE  USE 2 SPRAYS IN EACH NOSTRIL AT BEDTIME     losartan 50 MG tablet  Commonly known as:  COZAAR  Take 1 tablet (50 mg total) by mouth daily before breakfast.     nicotine polacrilex 4 MG gum  Commonly known as:  NICORETTE  Take 4 mg by mouth as needed for smoking cessation.     omeprazole 20 MG tablet  Commonly known as:  PRILOSEC OTC  Take 20 mg by mouth daily.     oxyCODONE 5 MG immediate release tablet  Commonly known as:  Oxy IR/ROXICODONE  Take 1-2 tablets by mouth every 4 hours as needed for pain.     phenazopyridine 100 MG tablet  Commonly known as:  PYRIDIUM  Take 1 tablet (100  mg total) by mouth 3 (three) times daily as needed for pain.     psyllium 58.6 % packet  Commonly known as:  METAMUCIL  Take 1 packet by mouth daily.     senna 8.6 MG tablet  Commonly known as:  SENOKOT  Take 1 tablet by mouth daily.     triamcinolone cream 0.1 %  Commonly known as:  KENALOG  Apply 1 application topically 2 (two) times daily. Applied to the adjacent area around your incision twice a day     zolpidem 5 MG tablet  Commonly known as:  AMBIEN  Take 5 mg by mouth at bedtime as needed for sleep.        Discharge Instructions: Please refer to Patient Instructions section of EMR for full details.  Patient was counseled important signs and symptoms that should prompt return to medical care, changes in medications, dietary instructions, activity restrictions, and follow up appointments.   Follow-Up Appointments:     Follow-up Information   Schedule an appointment as soon as possible for a visit with DAUB, Lina Sayre, MD.   Specialty:  Family Medicine   Contact information:   [place] [address] [phone]       Schedule  an appointment as soon as possible for a visit with [place]. (in 2 weeks)    Contact information:   [address] [place] [address] [phone]      Langston Masker, MD 10/19/2013, 10:10 AM PGY-1, [place]

## 2013-10-20 ENCOUNTER — Ambulatory Visit (INDEPENDENT_AMBULATORY_CARE_PROVIDER_SITE_OTHER): Payer: BC Managed Care – PPO | Admitting: Family Medicine

## 2013-10-20 VITALS — BP 118/72 | HR 72 | Temp 98.0°F | Resp 16 | Ht 72.0 in | Wt 178.0 lb

## 2013-10-20 DIAGNOSIS — C649 Malignant neoplasm of unspecified kidney, except renal pelvis: Secondary | ICD-10-CM

## 2013-10-20 DIAGNOSIS — R21 Rash and other nonspecific skin eruption: Secondary | ICD-10-CM

## 2013-10-20 NOTE — Progress Notes (Signed)
[facility] [address], [place] [address] [phone]  Date:  10/20/2013   Name:  Douglas [name]   DOB:  05-Jun-1956   MRN:  [id number]  PCP:  Jenny Reichmann, MD    Chief Complaint: Follow-up   History of Present Illness:  Douglas [name] is a 58 y.o. very pleasant male patient who presents with the following:  He was recently admitted to [facility] from 4/30 to 5/3 with UTI, indwelling stent, HTN, malnutrition, COPD. He has a history of renal cell carcinoma. He was admitted in [facility] in April- he had an extensive surgery for his renal call carcinoma which included a partial nephrectomy, cholecystectomy, appendectomy and partial colectomy. He unfortunatly has a rare and aggressive renal cell cancer which has metastasized.   He had an e coli UTI on 10/16/13.  He had leukocytosis up to 17.8.   He is not a candidate for chemo/ radiation due to the type of cancer that he has.  His treatment is surgical. He is here today because he has a JP drain in his right abdomen and wanted [place] to look at her.  He thinks this drain goes to his right kidney.  He notes that the output from his drain is becoming much less over the last few days and he wonders if it is time to have it removed.  His local urologist is Dr. Tresa Moore  He also is quite upset about a situation that occurred in the hospital recently.  Dr. Tresa Moore came to see him and they handed him some reports from [facility] without reading them.  They later realized that this report stated that Darrol was not happy with Dr. Zettie Pho care.  He adamantly denies ever saying anything of the sort and is very embarrassed and upset.   He actually thinks quite highly of Dr. Tresa Moore and recommends him whenever possible.  He is worried that Dr. Tresa Moore will not want to care for him any more.    Patient Active Problem List   Diagnosis Date Noted  . Protein-calorie malnutrition, severe 10/17/2013  . Complicated UTI (urinary tract infection)  10/16/2013  . Pyelonephritis 10/16/2013  . 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 Renal cell cancer 06/27/2012  . Renal calculi   . GERD (gastroesophageal reflux disease)   . Allergic rhinitis   . Osteoarthritis   . Encounter for long-term (current) use of other medications   . Hyperlipidemia   . Tobacco use disorder   . COPD (chronic obstructive pulmonary disease)   . Unspecified vitamin D deficiency   . Hypertension     Past Medical History  Diagnosis Date  . Renal calculi   . Compression fracture     S/P L-SPINE; pt does not recall this hx on 10/16/2013  . GERD (gastroesophageal reflux disease)   . History of surgical fusion joint     DDD C-SPINE  . Hemoptysis   . Allergic rhinitis   . Hyperlipidemia   . Encounter for long-term (current) use of other medications   . COPD (chronic obstructive pulmonary disease)     FVC .66  . Tobacco use disorder   . Melanoma in situ   . Unspecified vitamin D deficiency   . Leiomyoma OF SKIN    INCREASED RISK RENAL CELL CA  NEEDS CT OF KIDNEYS EVERY 2 YEARS NEXT DUE  02/06/13  . Hypertension   . Neuromuscular disorder   . History of blood transfusion 09/2013    "  think so; not sure; related to big OR"  . Osteoarthritis   . Arthritis     "all over my body" (10/16/2013)  . Chronic back pain     "neck to lower back" (10/16/2013)  . Depression     "situational since 09/22/2013 OR"  . Melanoma of back   . 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 Renal cell carcinoma     type 2; papillary    Past Surgical History  Procedure Laterality Date  . Anterior cruciate ligament repair Bilateral [id number]    [phone]  . Anterior cervical decomp/discectomy fusion  1992  . Hand ligament reconstruction Right   . Robotic assited partial nephrectomy  05/22/2012    Procedure: ROBOTIC ASSITED PARTIAL NEPHRECTOMY;  Surgeon: Alexis Frock, MD;  Location: [facility]S;  Service: Urology;  Laterality:  Right;  Right Robotic Cyst Decortication and Partial Nephrectomy   . Spine surgery    . Right colectomy Right 09/22/2013  . Cholecystectomy  09/22/2013    "had tumors in it"  . Appendectomy  09/22/2013  . Removal of fascia overlying the right psoas Right 09/22/2013  . Partial nephrectomy Right 09/22/2013  . Renal artery stent Right 09/22/2013  . Lymph node dissection Right 09/22/2013    "in the area of the kidney; they were clean"  . Harvest bone graft Right 1992    "hip; for neck fusion"  . Inguinal hernia repair Right 2004  . Ligament repair Right ~ 2001    "little finger"  . Melanoma excision  ~ 2007    "off my back"    History  Substance Use Topics  . Smoking status: Current Every Day Smoker -- 0.50 packs/day for 40 years    Types: Cigarettes  . Smokeless tobacco: Never Used     Comment: Pt chewing Nicorette gum currently  . Alcohol Use: Yes     Comment: 10/16/2013 "2-3 beers daily generally; nothing last month or so"    Family History  Problem Relation Age of Onset  . Adopted: Yes    Allergies  Allergen Reactions  . Avelox [Moxifloxacin Hcl In Nacl] Hives, Shortness Of Breath and Swelling    Medication list has been reviewed and updated.  Current Outpatient Prescriptions on File Prior to Visit  Medication Sig Dispense Refill  . atorvastatin (LIPITOR) 10 MG tablet Take 1 tablet (10 mg total) by mouth at bedtime.  90 tablet  0  . cephALEXin (KEFLEX) 500 MG capsule Take 1 capsule (500 mg total) by mouth every 6 (six) hours.  31 capsule  0  . clindamycin (CLEOCIN T) 1 % lotion Apply topically 2 (two) times daily. Applied to the bumps on your anterior chest twice a day  60 mL  0  . diazepam (VALIUM) 2 MG tablet Take 2-4 mg by mouth every 8 (eight) hours as needed for anxiety.      . docusate sodium (COLACE) 100 MG capsule Take 100 mg by mouth daily.      . fluticasone (FLONASE) 50 MCG/ACT nasal spray USE 2 SPRAYS IN EACH NOSTRIL AT BEDTIME  48 g  1  . losartan (COZAAR) 50 MG  tablet Take 1 tablet (50 mg total) by mouth daily before breakfast.  90 tablet  0  . nicotine polacrilex (NICORETTE) 4 MG gum Take 4 mg by mouth as needed for smoking cessation.      Marland Kitchen omeprazole (PRILOSEC OTC) 20 MG tablet Take 20 mg by mouth daily.      Marland Kitchen  oxyCODONE (OXY IR/ROXICODONE) 5 MG immediate release tablet Take 1-2 tablets by mouth every 4 hours as needed for pain.  60 tablet  0  . phenazopyridine (PYRIDIUM) 100 MG tablet Take 1 tablet (100 mg total) by mouth 3 (three) times daily as needed for pain.  10 tablet  0  . psyllium (METAMUCIL) 58.6 % packet Take 1 packet by mouth daily.      Marland Kitchen senna (SENOKOT) 8.6 MG tablet Take 1 tablet by mouth daily.      Marland Kitchen triamcinolone cream (KENALOG) 0.1 % Apply 1 application topically 2 (two) times daily. Applied to the adjacent area around your incision twice a day  90 g  2  . zolpidem (AMBIEN) 5 MG tablet Take 5 mg by mouth at bedtime as needed for sleep.       No current facility-administered medications on file prior to visit.    Review of Systems:  As per HPI- otherwise negative.   Physical Examination: Filed Vitals:   10/20/13 1056  BP: 118/72  Pulse: 72  Temp: 98 F (36.7 C)  Resp: 16   Filed Vitals:   10/20/13 1056  Height: 6' (1.829 m)  Weight: 178 lb (80.74 kg)   Body mass index is 24.14 kg/(m^2). Ideal Body Weight: Weight in (lb) to have BMI = 25: 183.9  GEN: WDWN, NAD, Non-toxic, A & O x 3, looks fairly well today HEENT: Atraumatic, Normocephalic. Neck supple. No masses, No LAD. Ears and Nose: No external deformity. CV: RRR, No M/G/R. No JVD. No thrill. No extra heart sounds. PULM: CTA B, no wheezes, crackles, rhonchi. No retractions. No resp. distress. No accessory muscle use. ABD: S, NT, ND, +BS. No rebound. No HSM. He had an allergic reaction over the L shaped incision from his recent operation.  They are using a steroid cream and this areas sees to be improving.  They showed me pictures and it certainly looks much  better There is a JP drain in the right lower abdomen that appears to be in good repair and contains a few drops of a yellow liquid   EXTR: No c/c/e NEURO Normal gait.  PSYCH: Normally interactive. Conversant. Not depressed or anxious appearing.  Calm demeanor.    Assessment and Plan: Renal cell carcinoma  Rash and nonspecific skin eruption  Rash is getting much better.  They may use the steroid cream just once a day for the next week or so and then DC if it continues to get better.  Called and was able to get an earlier appt with Dr. Tresa Moore this week- they may be able to remove his drain this week.  Spoke with triage RN and sent him a message to explain recent troublesome situation.    Signed Lamar Blinks, MD

## 2013-10-21 ENCOUNTER — Encounter: Payer: Self-pay | Admitting: Radiology

## 2013-10-22 LAB — CULTURE, BLOOD (ROUTINE X 2)
Culture: NO GROWTH
Culture: NO GROWTH

## 2013-10-31 DIAGNOSIS — Z1509 Genetic susceptibility to other malignant neoplasm: Secondary | ICD-10-CM

## 2013-11-12 ENCOUNTER — Ambulatory Visit (INDEPENDENT_AMBULATORY_CARE_PROVIDER_SITE_OTHER): Payer: BC Managed Care – PPO | Admitting: Emergency Medicine

## 2013-11-12 VITALS — BP 112/70 | HR 93 | Temp 98.1°F | Resp 16 | Ht 72.0 in | Wt 182.4 lb

## 2013-11-12 DIAGNOSIS — N390 Urinary tract infection, site not specified: Secondary | ICD-10-CM

## 2013-11-12 DIAGNOSIS — R369 Urethral discharge, unspecified: Secondary | ICD-10-CM

## 2013-11-12 DIAGNOSIS — R350 Frequency of micturition: Secondary | ICD-10-CM

## 2013-11-12 LAB — BASIC METABOLIC PANEL
BUN: 16 mg/dL (ref 6–23)
CALCIUM: 9.7 mg/dL (ref 8.4–10.5)
CO2: 20 mEq/L (ref 19–32)
Chloride: 107 mEq/L (ref 96–112)
Creat: 0.91 mg/dL (ref 0.50–1.35)
Glucose, Bld: 73 mg/dL (ref 70–99)
POTASSIUM: 4.5 meq/L (ref 3.5–5.3)
SODIUM: 137 meq/L (ref 135–145)

## 2013-11-12 LAB — POCT CBC
GRANULOCYTE PERCENT: 61.7 % (ref 37–80)
HCT, POC: 43.9 % (ref 43.5–53.7)
Hemoglobin: 14.2 g/dL (ref 14.1–18.1)
Lymph, poc: 2 (ref 0.6–3.4)
MCH: 30.3 pg (ref 27–31.2)
MCHC: 32.3 g/dL (ref 31.8–35.4)
MCV: 93.7 fL (ref 80–97)
MID (cbc): 0.4 (ref 0–0.9)
MPV: 8.5 fL (ref 0–99.8)
PLATELET COUNT, POC: 336 10*3/uL (ref 142–424)
POC Granulocyte: 4 (ref 2–6.9)
POC LYMPH PERCENT: 31.5 %L (ref 10–50)
POC MID %: 6.8 % (ref 0–12)
RBC: 4.69 M/uL (ref 4.69–6.13)
RDW, POC: 16.4 %
WBC: 6.5 10*3/uL (ref 4.6–10.2)

## 2013-11-12 LAB — POCT URINALYSIS DIPSTICK
Bilirubin, UA: NEGATIVE
Glucose, UA: NEGATIVE
Ketones, UA: NEGATIVE
NITRITE UA: POSITIVE
PROTEIN UA: NEGATIVE
Spec Grav, UA: <=1.005
UROBILINOGEN UA: 0.2
pH, UA: 6

## 2013-11-12 LAB — POCT UA - MICROSCOPIC ONLY
BACTERIA, U MICROSCOPIC: NEGATIVE
CASTS, UR, LPF, POC: NEGATIVE
CRYSTALS, UR, HPF, POC: NEGATIVE
Epithelial cells, urine per micros: NEGATIVE
Mucus, UA: NEGATIVE
Yeast, UA: POSITIVE

## 2013-11-12 MED ORDER — CEPHALEXIN 500 MG PO CAPS: 500.0000 mg | ORAL_CAPSULE | Freq: Four times a day (QID) | ORAL | Status: DC

## 2013-11-12 MED ORDER — HYDROCODONE-ACETAMINOPHEN 7.5-325 MG PO TABS: ORAL_TABLET | ORAL | Status: DC

## 2013-11-12 NOTE — Progress Notes (Addendum)
Subjective:  This chart was scribed for Dymond A. Lyrik Buresh MD,   by Stacy Gardner, [facility] Scribe. The patient was seen in room and the patient's care was started at 9:17 AM.   Patient ID: Douglas [name], male    DOB: 28-Oct-1955, 58 y.o.   MRN: [id number] Chief Complaint  Patient presents with  . Follow-up    HPI HPI Comments: HAROLD MONCUS is a 59 y.o. male , hx of renal cancer, of who arrives to the [facility] for a follow up. Pt complains of UTI symptoms. Pt complains of cloudy discharge and a "weird" sensation to the tip of his penis. Pt has been on Keflex since 5/3. He had a previous episode of urgency and a weak stream  ('dribble") when he urinated.   Pt denies penile discharge. Pt had  Pt had the cathter tube removed last week. He walks a mile a day. He reports losing 19 lbs. He reports having generalized bloating. Pt states for the past two weeks he has trouble lifting heavy weight due to weakness and soreness in his left arm. He has a drainage cathter to his left lower abdomen that was removed last week.    Patient Active Problem List   Diagnosis Date Noted  . Protein-calorie malnutrition, severe 10/17/2013  . Complicated UTI (urinary tract infection) 10/16/2013  . Pyelonephritis 10/16/2013  . 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 Renal cell cancer 06/27/2012  . Renal calculi   . GERD (gastroesophageal reflux disease)   . Allergic rhinitis   . Osteoarthritis   . Encounter for long-term (current) use of other medications   . Hyperlipidemia   . Tobacco use disorder   . COPD (chronic obstructive pulmonary disease)   . Unspecified vitamin D deficiency   . Hypertension    Past Medical History  Diagnosis Date  . Renal calculi   . Compression fracture     S/P L-SPINE; pt does not recall this hx on 10/16/2013  . GERD (gastroesophageal reflux disease)   . History of surgical fusion joint     DDD  C-SPINE  . Hemoptysis   . Allergic rhinitis   . Hyperlipidemia   . Encounter for long-term (current) use of other medications   . COPD (chronic obstructive pulmonary disease)     FVC .66  . Tobacco use disorder   . Melanoma in situ   . Unspecified vitamin D deficiency   . Leiomyoma OF SKIN    INCREASED RISK RENAL CELL CA  NEEDS CT OF KIDNEYS EVERY 2 YEARS NEXT DUE  02/06/13  . Hypertension   . Neuromuscular disorder   . History of blood transfusion 09/2013    "think so; not sure; related to big OR"  . Osteoarthritis   . Arthritis     "all over my body" (10/16/2013)  . Chronic back pain     "neck to lower back" (10/16/2013)  . Depression     "situational since 09/22/2013 OR"  . Melanoma of back   . 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 Renal cell carcinoma     type 2; papillary   Past Surgical History  Procedure Laterality Date  . Anterior cruciate ligament repair Bilateral [id number]    [phone]  . Anterior cervical decomp/discectomy fusion  1992  . Hand ligament reconstruction Right   . Robotic assited partial nephrectomy  05/22/2012    Procedure: ROBOTIC ASSITED PARTIAL NEPHRECTOMY;  Surgeon: Alexis Frock, MD;  Location: [facility];  Service: Urology;  Laterality: Right;  Right Robotic Cyst Decortication and Partial Nephrectomy   . Spine surgery    . Right colectomy Right 09/22/2013  . Cholecystectomy  09/22/2013    "had tumors in it"  . Appendectomy  09/22/2013  . Removal of fascia overlying the right psoas Right 09/22/2013  . Partial nephrectomy Right 09/22/2013  . Renal artery stent Right 09/22/2013  . Lymph node dissection Right 09/22/2013    "in the area of the kidney; they were clean"  . Harvest bone graft Right 1992    "hip; for neck fusion"  . Inguinal hernia repair Right 2004  . Ligament repair Right ~ 2001    "little finger"  . Melanoma excision  ~ 2007    "off my back"   Allergies  Allergen Reactions  . Avelox [Moxifloxacin Hcl In Nacl]  Hives, Shortness Of Breath and Swelling   Prior to Admission medications   Medication Sig Start Date End Date Taking? Authorizing Provider  atorvastatin (LIPITOR) 10 MG tablet Take 1 tablet (10 mg total) by mouth at bedtime. 08/22/13  Yes [name], MD  cephALEXin (KEFLEX) 500 MG capsule Take 1 capsule (500 mg total) by mouth every 6 (six) hours. 10/19/13  Yes Langston Masker, MD  clindamycin (CLEOCIN T) 1 % lotion Apply topically 2 (two) times daily. Applied to the bumps on your anterior chest twice a day 10/08/13  Yes [name], MD  fluticasone [facility]) 50 MCG/ACT nasal spray USE 2 SPRAYS IN EACH NOSTRIL AT BEDTIME 08/18/13  Yes [name], MD  losartan (COZAAR) 50 MG tablet Take 1 tablet (50 mg total) by mouth daily before breakfast. 08/22/13  Yes [name], MD  nicotine polacrilex (NICORETTE) 4 MG gum Take 4 mg by mouth as needed for smoking cessation.   Yes Historical Provider, MD  omeprazole (PRILOSEC OTC) 20 MG tablet Take 20 mg by mouth daily.   Yes Historical Provider, MD  triamcinolone cream (KENALOG) 0.1 % Apply 1 application topically 2 (two) times daily. Applied to the adjacent area around your incision twice a day 10/08/13  Yes [name], MD   History   Social History  . Marital Status: Married    Spouse Name: N/A    Number of Children: N/A  . 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 Smoking status: Current Every Day Smoker -- 0.50 packs/day for 40 years    Types: Cigarettes  . Smokeless tobacco: Never Used     Comment: Pt chewing Nicorette gum currently  . Alcohol Use: Yes     Comment: 10/16/2013 "2-3 beers daily generally; nothing last month or so"  . Drug Use: No  . Sexual Activity: Not Currently   Other Topics Concern  . Not on file   Social History Narrative   Walks daily for exercise.    Review of Systems  Constitutional: Positive for fatigue and unexpected weight change.    Genitourinary: Positive for urgency, decreased urine volume and difficulty urinating. Negative for dysuria, frequency, hematuria, discharge, penile swelling and enuresis.  Musculoskeletal: Positive for arthralgias and myalgias.       Objective:   Physical Exam  CONSTITUTIONAL: Well developed/well nourished HEAD: Normocephalic/atraumatic EYES: EOMI/PERRL ENMT: Mucous membranes moist NECK: supple no meningeal signs SPINE:entire spine nontender CV: S1/S2 noted, no murmurs/rubs/gallops noted LUNGS: Lungs are clear to auscultation bilaterally,  no apparent distress ABDOMEN: soft, nontender, no rebound or guarding there is a healed abdominal incision. His catheter site in the right lower abdomen has healed over. Penis is normal. GU:no cva tenderness NEURO: Pt is awake/alert, moves all extremitiesx4 EXTREMITIES: pulses normal, full ROM SKIN: warm, color normal PSYCH: no abnormalities of mood noted Results for orders placed in visit on 11/12/13  POCT CBC      Result Value Ref Range   WBC 6.5  4.6 - 10.2 K/uL   Lymph, poc 2.0  0.6 - 3.4   POC LYMPH PERCENT 31.5  10 - 50 %L   MID (cbc) 0.4  0 - 0.9   POC MID % 6.8  0 - 12 %M   POC Granulocyte 4.0  2 - 6.9   Granulocyte percent 61.7  37 - 80 %G   RBC 4.69  4.69 - 6.13 M/uL   Hemoglobin 14.2  14.1 - 18.1 g/dL   HCT, POC 43.9  43.5 - 53.7 %   MCV 93.7  80 - 97 fL   MCH, POC 30.3  27 - 31.2 pg   MCHC 32.3  31.8 - 35.4 g/dL   RDW, POC 16.4     Platelet Count, POC 336  142 - 424 K/uL   MPV 8.5  0 - 99.8 fL  POCT UA - MICROSCOPIC ONLY      Result Value Ref Range   WBC, Ur, HPF, POC TNTC     RBC, urine, microscopic eg     Bacteria, U Microscopic neg     Mucus, UA neg     Epithelial cells, urine per micros neg     Crystals, Ur, HPF, POC neg     Casts, Ur, LPF, POC neg     Yeast, UA positive    POCT URINALYSIS DIPSTICK      Result Value Ref Range   Color, UA yellow     Clarity, UA cloudy     Glucose, UA neg     Bilirubin, UA neg      Ketones, UA neg     Spec Grav, UA <=1.005     Blood, UA small     pH, UA 6.0     Protein, UA neg     Urobilinogen, UA 0.2     Nitrite, UA positive     Leukocytes, UA large (3+)          Assessment & Plan:  Patient is too numerous to count white cells on his urine. He also had multiple hyphae present consistent with yeast overgrowth. Cultures were done for routine and for yeast. I did place a call to Dr. Kendal Hymen to update him on [name] situation. I refilled the cephalexin and hydrocodone

## 2013-11-14 LAB — URINE CULTURE

## 2013-11-17 MED ORDER — NITROFURANTOIN MONOHYD MACRO 100 MG PO CAPS
100.0000 mg | ORAL_CAPSULE | Freq: Two times a day (BID) | ORAL | Status: DC
Start: 2013-11-17 — End: 2013-11-26

## 2013-11-17 NOTE — Addendum Note (Signed)
Addended by: Orion Crook on: 11/17/2013 09:48 AM   Modules accepted: Orders

## 2013-11-21 ENCOUNTER — Telehealth: Payer: Self-pay

## 2013-11-21 NOTE — Telephone Encounter (Signed)
Called pt per Dr Perfecto Kingdom instr's and advised him that he also had some yeast in his urine, but will treat for that when he finishes his Abx for bacterial infection, and asked that he come for re-check w/Dr Daub next week. Pt agreed and plans to be here on Wed afternoon.

## 2013-11-26 ENCOUNTER — Ambulatory Visit (INDEPENDENT_AMBULATORY_CARE_PROVIDER_SITE_OTHER): Payer: BC Managed Care – PPO | Admitting: Emergency Medicine

## 2013-11-26 VITALS — BP 115/70 | HR 104 | Temp 97.8°F | Resp 18 | Ht 72.0 in | Wt 181.0 lb

## 2013-11-26 DIAGNOSIS — R1031 Right lower quadrant pain: Secondary | ICD-10-CM

## 2013-11-26 DIAGNOSIS — C649 Malignant neoplasm of unspecified kidney, except renal pelvis: Secondary | ICD-10-CM

## 2013-11-26 DIAGNOSIS — N390 Urinary tract infection, site not specified: Secondary | ICD-10-CM

## 2013-11-26 LAB — BASIC METABOLIC PANEL
BUN: 14 mg/dL (ref 6–23)
CHLORIDE: 106 meq/L (ref 96–112)
CO2: 23 meq/L (ref 19–32)
CREATININE: 0.93 mg/dL (ref 0.50–1.35)
Calcium: 9.4 mg/dL (ref 8.4–10.5)
Glucose, Bld: 121 mg/dL — ABNORMAL HIGH (ref 70–99)
POTASSIUM: 3.9 meq/L (ref 3.5–5.3)
Sodium: 139 mEq/L (ref 135–145)

## 2013-11-26 LAB — POCT CBC
GRANULOCYTE PERCENT: 70 % (ref 37–80)
HCT, POC: 44.1 % (ref 43.5–53.7)
HEMOGLOBIN: 14.1 g/dL (ref 14.1–18.1)
Lymph, poc: 1.9 (ref 0.6–3.4)
MCH: 29.8 pg (ref 27–31.2)
MCHC: 32 g/dL (ref 31.8–35.4)
MCV: 93.3 fL (ref 80–97)
MID (cbc): 0.4 (ref 0–0.9)
MPV: 8.1 fL (ref 0–99.8)
PLATELET COUNT, POC: 352 10*3/uL (ref 142–424)
POC Granulocyte: 5.2 (ref 2–6.9)
POC LYMPH PERCENT: 25 %L (ref 10–50)
POC MID %: 5 %M (ref 0–12)
RBC: 4.73 M/uL (ref 4.69–6.13)
RDW, POC: 16.8 %
WBC: 7.4 10*3/uL (ref 4.6–10.2)

## 2013-11-26 LAB — POCT URINALYSIS DIPSTICK
Bilirubin, UA: NEGATIVE
Glucose, UA: NEGATIVE
Ketones, UA: NEGATIVE
NITRITE UA: NEGATIVE
PH UA: 5.5
PROTEIN UA: 30
Spec Grav, UA: 1.015
Urobilinogen, UA: 0.2

## 2013-11-26 LAB — POCT UA - MICROSCOPIC ONLY
CASTS, UR, LPF, POC: NEGATIVE
CRYSTALS, UR, HPF, POC: NEGATIVE
YEAST UA: POSITIVE

## 2013-11-26 MED ORDER — NITROFURANTOIN MONOHYD MACRO 100 MG PO CAPS: 100.0000 mg | ORAL_CAPSULE | Freq: Two times a day (BID) | ORAL | Status: DC

## 2013-11-26 MED ORDER — HYDROCODONE-ACETAMINOPHEN 7.5-325 MG PO TABS: ORAL_TABLET | ORAL | Status: DC

## 2013-11-26 NOTE — Progress Notes (Signed)
Subjective:    Patient ID: Douglas [name], male    DOB: 01/04/1956, 58 y.o.   MRN: [id number]  HPI  58 year old male presents to [facility] with on-going right lower quadrant abdomen pain, right lower quadrant abdomen swelling, no fever, no chills, no burning with urination, has occasional urinary urgency Off Keflex, now on Macrobid Patient has [place] 3/4 of a mile a day after work.  Working 4 hour days  Rehabiltating left shoulder using 8-15 pound dumbells, decreased ROM after renal surgery Lost 19 pounds post renal surgery, yet has gained 7 pounds back  Patient plans to go to his friends cancer support group to help with his depression He feels a lot better than 5 weeks ago at last OV  Review of Systems     Objective:   Physical Exam physical exam. Neck supple without adenopathy. Chest clear to auscultation and percussion. Heart regular rate and rhythm no murmurs. Abdomen. Bowel sounds are present there is a well-healed surgical scar there is a healed portal site. There is tenderness to palpation deep in the right lower abdomen  Results for orders placed in visit on 11/26/13  POCT URINALYSIS DIPSTICK      Result Value Ref Range   Color, UA yellow     Clarity, UA cloudy     Glucose, UA neg     Bilirubin, UA neg     Ketones, UA neg     Spec Grav, UA 1.015     Blood, UA moderate     pH, UA 5.5     Protein, UA 30     Urobilinogen, UA 0.2     Nitrite, UA neg     Leukocytes, UA large (3+)    POCT UA - MICROSCOPIC ONLY      Result Value Ref Range   WBC, Ur, HPF, POC 20-25     RBC, urine, microscopic 15-20     Bacteria, U Microscopic trace     Mucus, UA trace     Epithelial cells, urine per micros 1-3     Crystals, Ur, HPF, POC neg     Casts, Ur, LPF, POC neg     Yeast, UA positive    POCT CBC      Result Value Ref Range   WBC 7.4  4.6 - 10.2 K/uL   Lymph, poc 1.9  0.6 - 3.4   POC LYMPH PERCENT 25.0  10 - 50 %L   MID (cbc) 0.4  0 - 0.9     POC MID % 5.0  0 - 12 %M   POC Granulocyte 5.2  2 - 6.9   Granulocyte percent 70.0  37 - 80 %G   RBC 4.73  4.69 - 6.13 M/uL   Hemoglobin 14.1  14.1 - 18.1 g/dL   HCT, POC 44.1  43.5 - 53.7 %   MCV 93.3  80 - 97 fL   MCH, POC 29.8  27 - 31.2 pg   MCHC 32.0  31.8 - 35.4 g/dL   RDW, POC 16.8     Platelet Count, POC 352  142 - 424 K/uL   MPV 8.1  0 - 99.8 fL        Assessment & Plan:  This is a complicated history. Patient had papillary renal cancer. He underwent surgery and then developed metastatic disease. He underwent repeat surgery at the [facility] with removal of metastatic implants portion of the liver a sending colon gallbladder and  appendix. Last culture grew Candida and Enterobacter workup. He was initially on cephalexin and this was switched to Macrobid. The Candida has not been treated yet. He feels a fullness in the lower abdomen we'll proceed with CT of the abdomen with oral contrast only. I do not want to give him IV contrast because he has had a partial nephrectomy and may need to undergo chemotherapy in the future and needs his good kidney function as possible. If his urine culture is negative we'll consider treating for Candida superinfection. We'll discuss this with  ID first.

## 2013-11-27 ENCOUNTER — Telehealth: Payer: Self-pay

## 2013-11-27 NOTE — Telephone Encounter (Signed)
To expedite the review of your request, contact [facility]IM at [phone] to find out what additional information is needed or if the ordering provider would like to discuss this case with a physician reviewer.         Order Request Summary     Request Status:  In Progress                     Health Plan: [employer]               Case Due to Close On/Before:  12/01/2013         Scheduled Date of Service:  11/27/2013                    Member Information: Douglas [name], Douglas [name] Member #:[id number] [address] [place] Date of Birth:[birth year] Phone:   Ordering Provider:  DAUB,Nephtali A [place] [phone] [phone] INO:[id number]   Imaging Facility:      [facility] [place] Phone:[phone] Fax: [phone]                      Exam Information:               The information below was obtained from the Ordering Provider and has not been independently verified by AIM. AIM assumes no responsibility for the accuracy of this information or for its consistency with the patient's medical record.         SUMMARY OF EXAMS (2)       Exam   Exam Request Status       Abdomen - CT   Without Contrast            Review ExamWithdraw Exam                                     Pelvis - CT   Without Contrast            Review ExamWithdraw Exam                                                                           = Multiple Decisions Rendered                     -------------------------------------------------------------------------------- The issuance of an Order ID is not a guarantee of benefits; payment is subject to the  member's eligibility and plan provisions in effect at the time of service. --------------------------------------------------------------------------------                              [employer] [place] is an Air traffic controller of the [employer] and [employer].                   Have a comment or suggestion?     Copyright  2012, [place], All Rights Reserved.                              --------------------------------------------------------------------------------

## 2013-11-27 NOTE — Telephone Encounter (Signed)
CT abdomen and pelvis needs review,additional information , I called to give additional clinical information approval 12/26/2013 Josem Kaufmann number is [phone], Butch Penny advised in referrals.

## 2013-11-28 LAB — URINE CULTURE
Colony Count: NO GROWTH
ORGANISM ID, BACTERIA: NO GROWTH

## 2013-11-29 ENCOUNTER — Other Ambulatory Visit: Payer: Self-pay | Admitting: Radiology

## 2013-12-02 ENCOUNTER — Other Ambulatory Visit: Payer: Self-pay | Admitting: Emergency Medicine

## 2013-12-02 NOTE — Telephone Encounter (Signed)
Dr Everlene Farrier, you have seen pt several times recently but not for HTN since Dec. Can we give him 90 day RFs to mail order?

## 2013-12-04 ENCOUNTER — Inpatient Hospital Stay (HOSPITAL_COMMUNITY)
Admission: AD | Admit: 2013-12-04 | Discharge: 2013-12-06 | DRG: 690 | Disposition: A | Discharge status: Home / Self Care | Payer: BC Managed Care – PPO | Source: Ambulatory Visit | Attending: Family Medicine | Admitting: Family Medicine

## 2013-12-04 ENCOUNTER — Ambulatory Visit (INDEPENDENT_AMBULATORY_CARE_PROVIDER_SITE_OTHER): Payer: BC Managed Care – PPO | Admitting: Family Medicine

## 2013-12-04 ENCOUNTER — Ambulatory Visit
Admission: RE | Admit: 2013-12-04 | Discharge: 2013-12-04 | Disposition: A | Discharge status: Home / Self Care | Payer: BC Managed Care – PPO | Source: Ambulatory Visit | Attending: Emergency Medicine | Admitting: Emergency Medicine

## 2013-12-04 ENCOUNTER — Telehealth: Payer: Self-pay | Admitting: Family Medicine

## 2013-12-04 ENCOUNTER — Other Ambulatory Visit: Payer: Self-pay | Admitting: Family Medicine

## 2013-12-04 VITALS — BP 114/70 | HR 121 | Temp 100.3°F | Resp 20 | Ht 73.0 in | Wt 181.0 lb

## 2013-12-04 DIAGNOSIS — C649 Malignant neoplasm of unspecified kidney, except renal pelvis: Secondary | ICD-10-CM

## 2013-12-04 DIAGNOSIS — R3915 Urgency of urination: Secondary | ICD-10-CM

## 2013-12-04 DIAGNOSIS — C641 Malignant neoplasm of right kidney, except renal pelvis: Secondary | ICD-10-CM

## 2013-12-04 DIAGNOSIS — R1031 Right lower quadrant pain: Secondary | ICD-10-CM

## 2013-12-04 DIAGNOSIS — E43 Unspecified severe protein-calorie malnutrition: Secondary | ICD-10-CM

## 2013-12-04 DIAGNOSIS — J449 Chronic obstructive pulmonary disease, unspecified: Secondary | ICD-10-CM | POA: Diagnosis present

## 2013-12-04 DIAGNOSIS — I10 Essential (primary) hypertension: Secondary | ICD-10-CM

## 2013-12-04 DIAGNOSIS — F3289 Other specified depressive episodes: Secondary | ICD-10-CM | POA: Diagnosis present

## 2013-12-04 DIAGNOSIS — E785 Hyperlipidemia, unspecified: Secondary | ICD-10-CM

## 2013-12-04 DIAGNOSIS — K219 Gastro-esophageal reflux disease without esophagitis: Secondary | ICD-10-CM

## 2013-12-04 DIAGNOSIS — R509 Fever, unspecified: Secondary | ICD-10-CM

## 2013-12-04 DIAGNOSIS — R000 Tachycardia, unspecified: Secondary | ICD-10-CM

## 2013-12-04 DIAGNOSIS — Z85528 Personal history of other malignant neoplasm of kidney: Secondary | ICD-10-CM

## 2013-12-04 DIAGNOSIS — N12 Tubulo-interstitial nephritis, not specified as acute or chronic: Secondary | ICD-10-CM | POA: Diagnosis present

## 2013-12-04 DIAGNOSIS — Z905 Acquired absence of kidney: Secondary | ICD-10-CM

## 2013-12-04 DIAGNOSIS — N390 Urinary tract infection, site not specified: Principal | ICD-10-CM

## 2013-12-04 DIAGNOSIS — R1011 Right upper quadrant pain: Secondary | ICD-10-CM

## 2013-12-04 DIAGNOSIS — Z1509 Genetic susceptibility to other malignant neoplasm: Secondary | ICD-10-CM | POA: Diagnosis present

## 2013-12-04 DIAGNOSIS — E559 Vitamin D deficiency, unspecified: Secondary | ICD-10-CM | POA: Diagnosis present

## 2013-12-04 DIAGNOSIS — F17200 Nicotine dependence, unspecified, uncomplicated: Secondary | ICD-10-CM

## 2013-12-04 DIAGNOSIS — F329 Major depressive disorder, single episode, unspecified: Secondary | ICD-10-CM | POA: Diagnosis present

## 2013-12-04 DIAGNOSIS — R319 Hematuria, unspecified: Secondary | ICD-10-CM

## 2013-12-04 DIAGNOSIS — D481 Neoplasm of uncertain behavior of connective and other soft tissue: Secondary | ICD-10-CM

## 2013-12-04 DIAGNOSIS — J4489 Other specified chronic obstructive pulmonary disease: Secondary | ICD-10-CM | POA: Diagnosis present

## 2013-12-04 LAB — POCT URINALYSIS DIPSTICK
Glucose, UA: NEGATIVE
Nitrite, UA: NEGATIVE
Protein, UA: 100
Spec Grav, UA: 1.01
Urobilinogen, UA: 1
pH, UA: 5.5

## 2013-12-04 LAB — POCT CBC
Granulocyte percent: 86.1 % — AB (ref 37–80)
HCT, POC: 39.7 % — AB (ref 43.5–53.7)
Hemoglobin: 12.8 g/dL — AB (ref 14.1–18.1)
Lymph, poc: 1.2 (ref 0.6–3.4)
MCH, POC: 29.5 pg (ref 27–31.2)
MCHC: 32.2 g/dL (ref 31.8–35.4)
MCV: 91.5 fL (ref 80–97)
MID (cbc): 0.5 (ref 0–0.9)
MPV: 7.9 fL (ref 0–99.8)
POC Granulocyte: 10.6 — AB (ref 2–6.9)
POC LYMPH PERCENT: 9.6 % — AB (ref 10–50)
POC MID %: 4.3 %M (ref 0–12)
Platelet Count, POC: 298 10*3/uL (ref 142–424)
RBC: 4.34 M/uL — AB (ref 4.69–6.13)
RDW, POC: 16.5 %
WBC: 12.3 10*3/uL — AB (ref 4.6–10.2)

## 2013-12-04 LAB — POCT UA - MICROSCOPIC ONLY
Casts, Ur, LPF, POC: NEGATIVE
Crystals, Ur, HPF, POC: NEGATIVE
Mucus, UA: NEGATIVE

## 2013-12-04 IMAGING — CT CT ABD-PELV W/O CM
3 of 4 series · 13 of 36 positions shown, 19 images · IV contrast (READICAT/WATER)
Comparison: [DATE]

CLINICAL DATA: Right lower quadrant abdominal pain and swelling.
Weight loss diarrhea. History of metastatic papillary renal cell
cancer.

EXAM:
CT ABDOMEN AND PELVIS WITHOUT CONTRAST
TECHNIQUE: Multidetector CT imaging of the abdomen and pelvis was performed
following the standard protocol without IV contrast.

[Series 3: abd/pelvis w/o · axial · non-contrast · 0.78mm/px · z∈[-365,-20]mm · 8 of 89 slices shown, 13 images]
[im 10/89  soft-tissue]
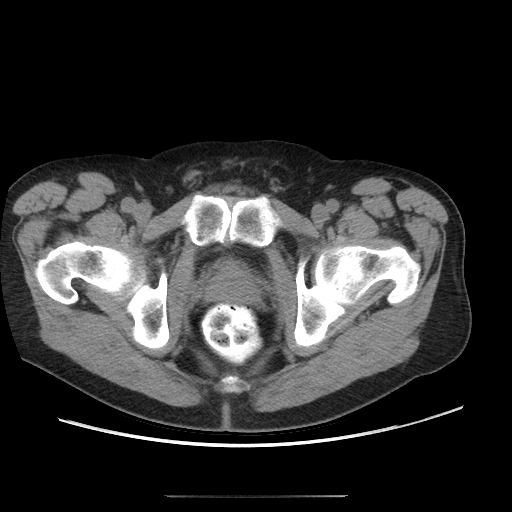
[im 10/89  bone]
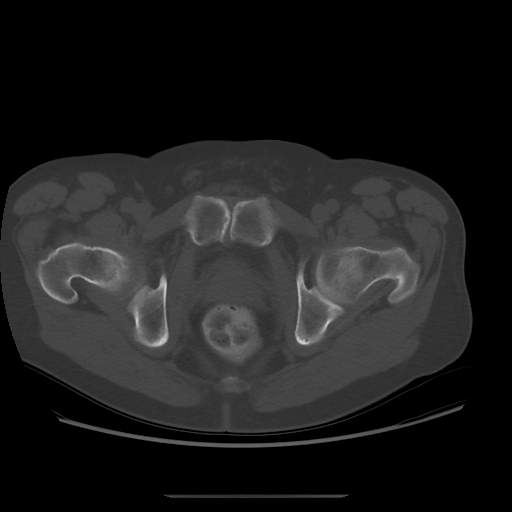
[im 20/89  soft-tissue]
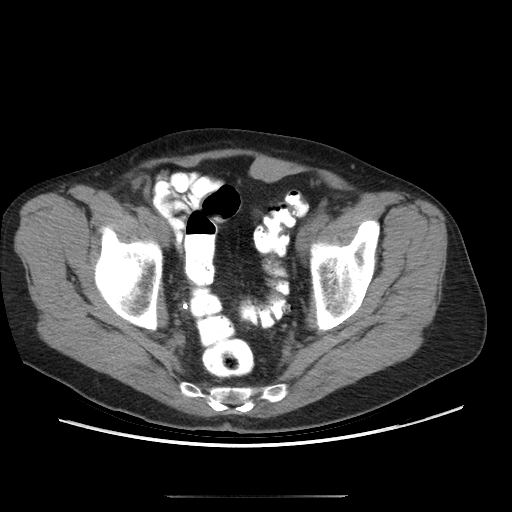
[im 30/89  soft-tissue]
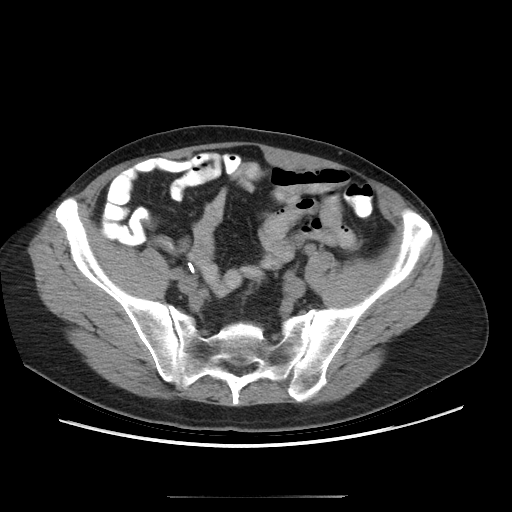
[im 40/89  soft-tissue]
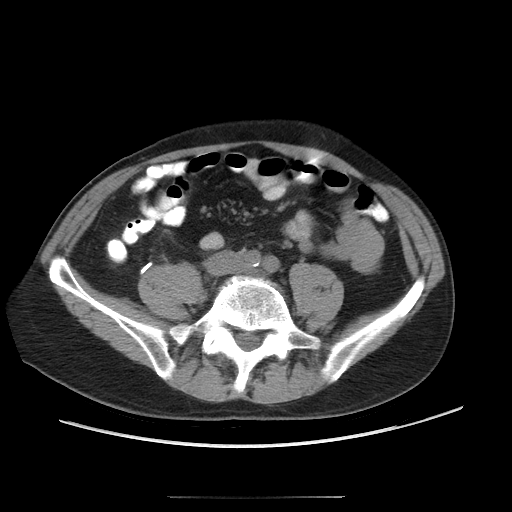
[im 49/89  soft-tissue]
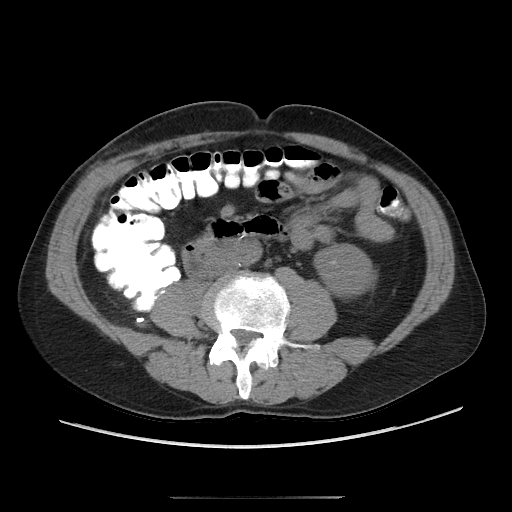
[im 49/89  lung]
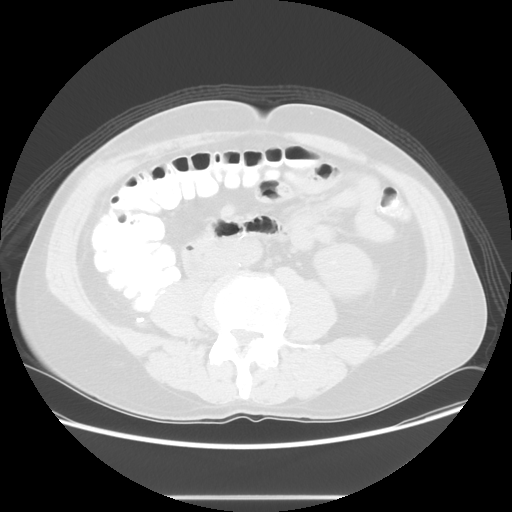
[im 59/89  soft-tissue]
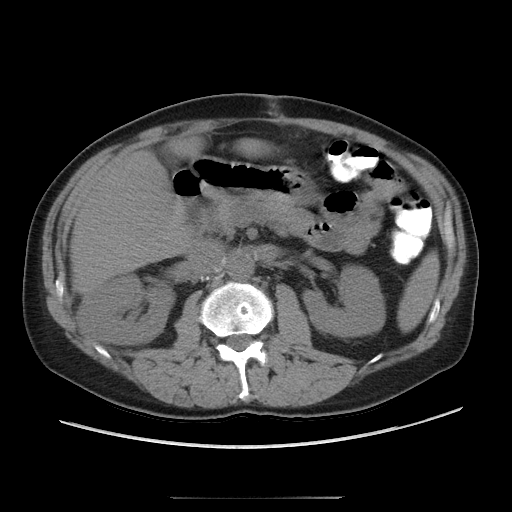
[im 59/89  lung]
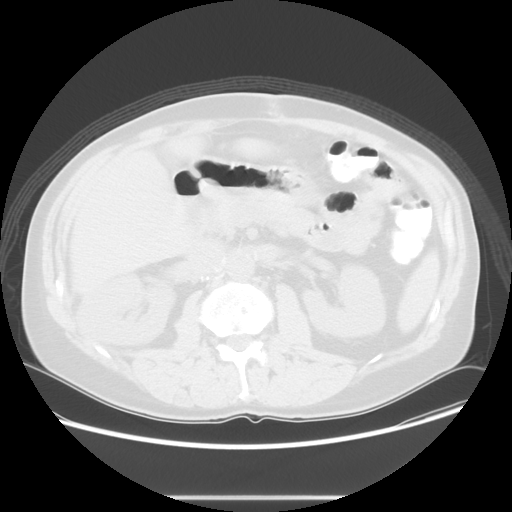
[im 69/89  soft-tissue]
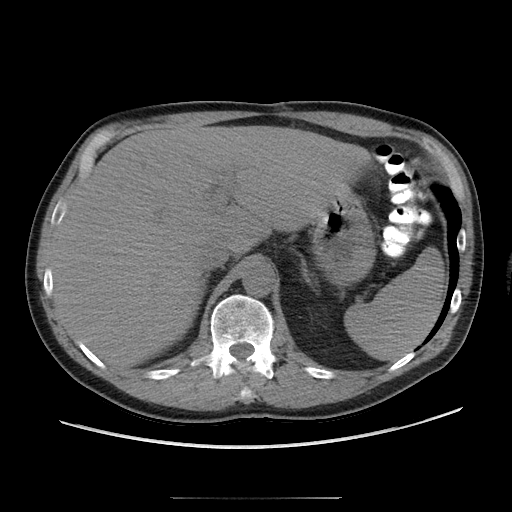
[im 69/89  lung]
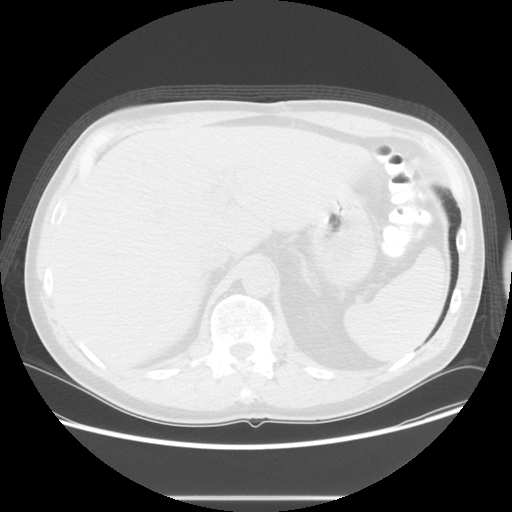
[im 79/89  soft-tissue]
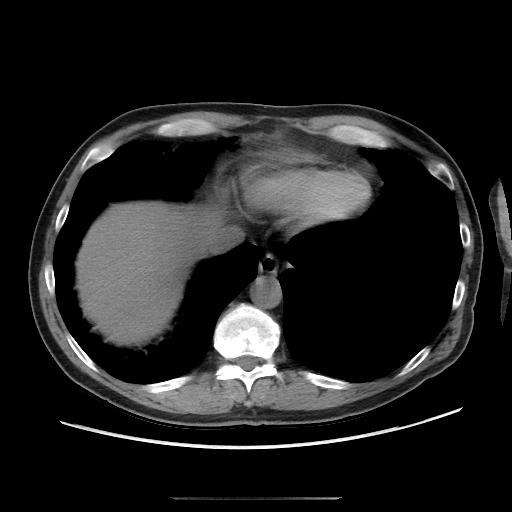
[im 79/89  lung]
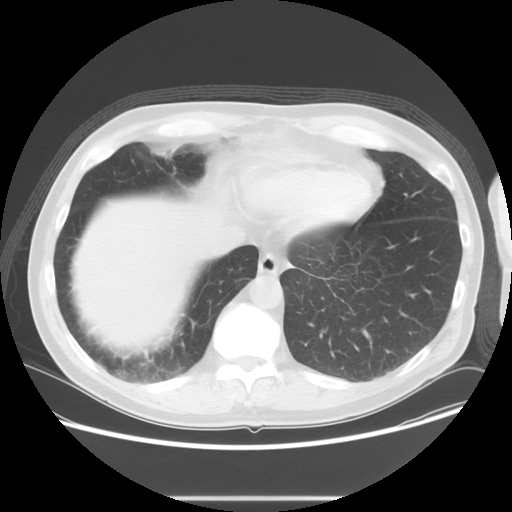

[Series 601: coronal body · coronal · 0.94mm/px · 1 of 110 slices shown, 2 images]
[im 37/110  soft-tissue]
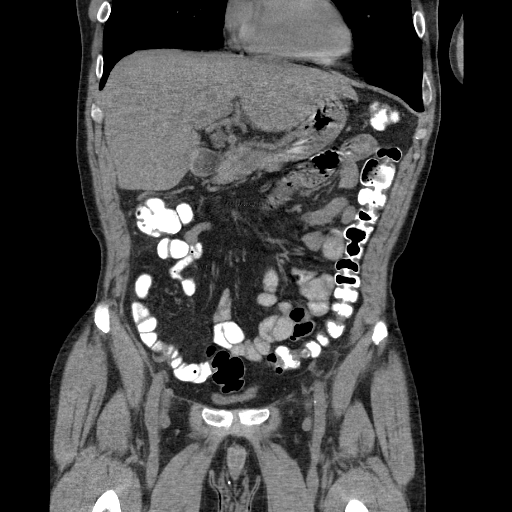
[im 37/110  bone]
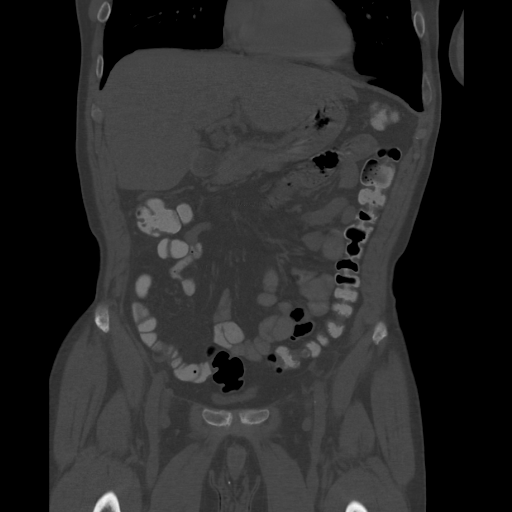

[Series 602: sagittal body · sagittal · 0.94mm/px · 4 of 161 slices shown]
[im 18/161  soft-tissue]
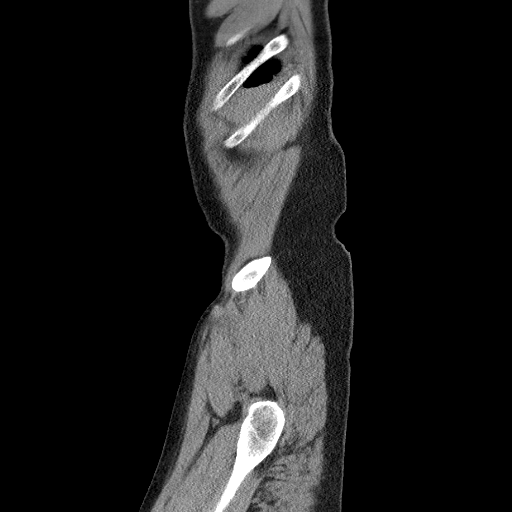
[im 36/161  soft-tissue]
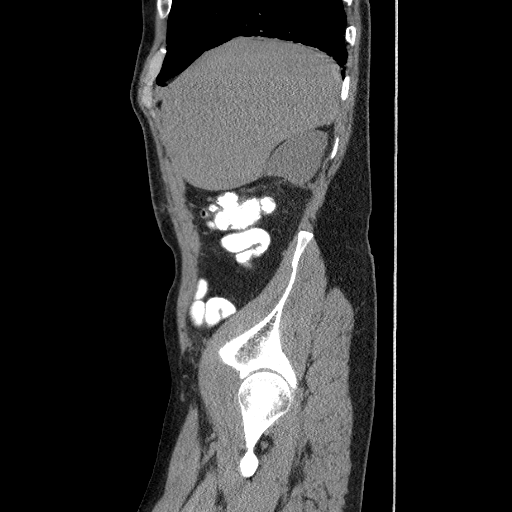
[im 54/161  soft-tissue]
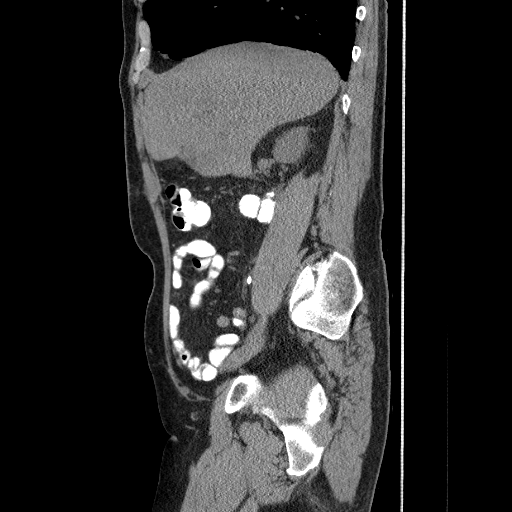
[im 72/161  soft-tissue]
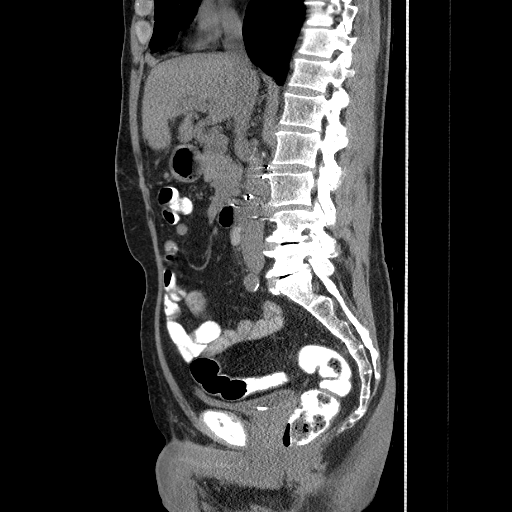

[13 of 36 positions shown; findings below may reference images not displayed]

FINDINGS: The lung bases are clear. No pleural effusion. The heart is normal
in size. No pericardial effusion.

The unenhanced appearance of the liver is unremarkable. No focal
lesions or biliary dilatation. The spleen is normal in size. No
focal lesions. The pancreas is grossly normal.

The adrenal glands and kidneys are stable. Double-J ureteral stent
is again noted on the right side. No hydronephrosis.

The stomach, duodenum, small bowel and colon are grossly normal. No
inflammatory changes, mass lesions or obstructive findings. No
mesenteric or retroperitoneal mass or adenopathy. The aorta is
normal in caliber. Retroperitoneal surgical changes are noted on the
right side.

The bladder, prostate gland and seminal vesicles are unremarkable.
No pelvic mass, adenopathy or free pelvic fluid collections. No
inguinal mass or adenopathy.

The bony structures are unremarkable. Mild scoliosis and
degenerative changes.
IMPRESSION: No acute abdominal/ pelvic findings, mass lesions or adenopathy.

Stable right-sided double-J ureteral stent.

## 2013-12-04 MED ORDER — LOSARTAN POTASSIUM 50 MG PO TABS
50.0000 mg | ORAL_TABLET | Freq: Every day | ORAL | Status: DC
  Administered 2013-12-05: 50 mg via ORAL
  Filled 2013-12-04 (×2): qty 1

## 2013-12-04 MED ORDER — ATORVASTATIN CALCIUM 10 MG PO TABS
10.0000 mg | ORAL_TABLET | Freq: Every day | ORAL | Status: DC
  Administered 2013-12-05: 10 mg via ORAL
  Filled 2013-12-04 (×2): qty 1

## 2013-12-04 MED ORDER — HEPARIN SODIUM (PORCINE) 5000 UNIT/ML IJ SOLN
5000.0000 [IU] | Freq: Three times a day (TID) | INTRAMUSCULAR | Status: DC
  Administered 2013-12-05 (×3): 5000 [IU] via SUBCUTANEOUS
  Filled 2013-12-04 (×8): qty 1

## 2013-12-04 MED ORDER — SODIUM CHLORIDE 0.9 % IV SOLN
INTRAVENOUS | Status: AC
  Administered 2013-12-05: 05:00:00 via INTRAVENOUS

## 2013-12-04 MED ORDER — ALBUTEROL SULFATE (2.5 MG/3ML) 0.083% IN NEBU: 2.5000 mg | INHALATION_SOLUTION | Freq: Four times a day (QID) | RESPIRATORY_TRACT | Status: DC | PRN

## 2013-12-04 MED ORDER — ONDANSETRON HCL 4 MG/2ML IJ SOLN: 4.0000 mg | Freq: Four times a day (QID) | INTRAMUSCULAR | Status: DC | PRN

## 2013-12-04 MED ORDER — ONDANSETRON HCL 4 MG PO TABS: 4.0000 mg | ORAL_TABLET | Freq: Four times a day (QID) | ORAL | Status: DC | PRN

## 2013-12-04 MED ORDER — PANTOPRAZOLE SODIUM 40 MG PO TBEC
40.0000 mg | DELAYED_RELEASE_TABLET | Freq: Every day | ORAL | Status: DC
  Administered 2013-12-05 – 2013-12-06 (×2): 40 mg via ORAL
  Filled 2013-12-04 (×2): qty 1

## 2013-12-04 MED ORDER — ACETAMINOPHEN 325 MG PO TABS: 650.0000 mg | ORAL_TABLET | Freq: Four times a day (QID) | ORAL | Status: DC | PRN

## 2013-12-04 MED ORDER — NICOTINE POLACRILEX 2 MG MT GUM
4.0000 mg | CHEWING_GUM | OROMUCOSAL | Status: DC | PRN
  Filled 2013-12-04: qty 2

## 2013-12-04 MED ORDER — ACETAMINOPHEN 650 MG RE SUPP: 650.0000 mg | Freq: Four times a day (QID) | RECTAL | Status: DC | PRN

## 2013-12-04 NOTE — Progress Notes (Signed)
Chief Complaint:  Chief Complaint  Patient presents with  . Nausea    pt spoke with Dr. Everlene Farrier and he requested that he come in for WBC check.     HPI: Douglas [name] is a 58 y.o. male who is here for new  1-2 day onset of fevers, chills,  And also lower right sided abd pain at the site of his recent surgical incision site. Of pertinent interest he has renal cell cancer and  was recently in  Mid April up at the [place] and had a  2nd right partial nephrectomy, colon resection, and also ureteral stent placement, subsequently upon his return to [facility] developed a UTI that grew out Enterobacter infection and  was put on Keflex, however Enterobacter was resistant to Keflex  So his abx was  changed to [facility] and he has been on this for the  last 3-4 weeks. Last urine cx while on Macrobid on 11/26/13 was negative. His last cbc  On the same day was also  negative.   He also had a CT abd/pelvis today which did not show anything acute  CT Abd/ Pelvis IMPRESSION:  No acute abdominal/ pelvic findings, mass lesions or adenopathy.  Stable right-sided double-J ureteral stent.  Electronically Signed  By: Kalman Jewels M.D.  On: 12/04/2013 16:24     3wk ago   Culture ENTEROBACTER AEROGENES    Colony Count >=100,000 COLONIES/ML    Organism ID, Bacteria ENTEROBACTER AEROGENES    Resulting Agency [facility]   Culture & Susceptibility    Antibiotic  Organism Organism Organism     ENTEROBACTER AEROGENES     AMOX/CLAVULANIC  >=32 R Final       CEFAZOLIN  >=64 R Final       CEFEPIME  <=1 S Final       CEFTAZIDIME  >=64 R Final       CEFTRIAXONE  16 I Final       CIPROFLOXACIN  <=0.25 S Final       GENTAMICIN  <=1 S Final       IMIPENEM  1 S Final       LEVOFLOXACIN  <=0.12 S Final       NITROFURANTOIN  32 S Final       PIP/TAZO  >=128 R Final       TOBRAMYCIN  <=1 S Final       TRIMETH/SULFA  <=20 S Final                  Past Medical History  Diagnosis Date  . Renal calculi   .  Compression fracture     S/P L-SPINE; pt does not recall this hx on 10/16/2013  . GERD (gastroesophageal reflux disease)   . History of surgical fusion joint     DDD C-SPINE  . Hemoptysis   . Allergic rhinitis   . Hyperlipidemia   . Encounter for long-term (current) use of other medications   . COPD (chronic obstructive pulmonary disease)     FVC .66  . Tobacco use disorder   . Melanoma in situ   . Unspecified vitamin D deficiency   . Leiomyoma OF SKIN    INCREASED RISK RENAL CELL CA  NEEDS CT OF KIDNEYS EVERY 2 YEARS NEXT DUE  02/06/13  . Hypertension   . Neuromuscular disorder   . History of blood transfusion 09/2013    "think so; not sure; related to big OR"  . Osteoarthritis   .  Arthritis     "all over my body" (10/16/2013)  . Chronic back pain     "neck to lower back" (10/16/2013)  . Depression     "situational since 09/22/2013 OR"  . Melanoma of back   . 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 Renal cell carcinoma     type 2; papillary   Past Surgical History  Procedure Laterality Date  . Anterior cruciate ligament repair Bilateral [id number]    [id number]  . Anterior cervical decomp/discectomy fusion  1992  . Hand ligament reconstruction Right   . Robotic assited partial nephrectomy  05/22/2012    Procedure: ROBOTIC ASSITED PARTIAL NEPHRECTOMY;  Surgeon: Alexis Frock, MD;  Location: [facility];  Service: Urology;  Laterality: Right;  Right Robotic Cyst Decortication and Partial Nephrectomy   . Spine surgery    . Right colectomy Right 09/22/2013  . Cholecystectomy  09/22/2013    "had tumors in it"  . Appendectomy  09/22/2013  . Removal of fascia overlying the right psoas Right 09/22/2013  . Partial nephrectomy Right 09/22/2013  . Renal artery stent Right 09/22/2013  . Lymph node dissection Right 09/22/2013    "in the area of the kidney; they were clean"  . Harvest bone graft Right 1992    "hip; for neck fusion"  . Inguinal hernia repair Right 2004  .  Ligament repair Right ~ 2001    "little finger"  . Melanoma excision  ~ 2007    "off my back"   History   Social History  . Marital Status: Married    Spouse Name: N/A    Number of Children: N/A  . 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 Smoking status: Current Every Day Smoker -- 0.50 packs/day for 40 years    Types: Cigarettes  . Smokeless tobacco: Never Used     Comment: Pt chewing Nicorette gum currently  . Alcohol Use: Yes     Comment: 10/16/2013 "2-3 beers daily generally; nothing last month or so"  . Drug Use: No  . Sexual Activity: Not Currently   Other Topics Concern  . None   Social History Narrative   Walks daily for exercise.   Family History  Problem Relation Age of Onset  . Adopted: Yes   Allergies  Allergen Reactions  . Avelox [Moxifloxacin Hcl In Nacl] Hives, Shortness Of Breath and Swelling   Prior to Admission medications   Medication Sig Start Date End Date Taking? Authorizing Provider  atorvastatin (LIPITOR) 10 MG tablet TAKE 1 BY MOUTH AT BEDTIME 12/02/13  Yes [name], MD  fluticasone [facility]) 50 MCG/ACT nasal spray USE 2 SPRAYS IN EACH NOSTRIL AT BEDTIME 08/18/13  Yes [name], MD  HYDROcodone-acetaminophen (NORCO) 7.5-325 MG per tablet Take one every 8 hours for pain. 11/26/13  Yes [name], MD  losartan (COZAAR) 50 MG tablet TAKE 1 BY MOUTH DAILY BEFORE BREAKFAST 12/02/13  Yes [name], MD  nicotine polacrilex (NICORETTE) 4 MG gum Take 4 mg by mouth as needed for smoking cessation.   Yes Historical Provider, MD  nitrofurantoin, macrocrystal-monohydrate, (MACROBID) 100 MG capsule Take 1 capsule (100 mg total) by mouth 2 (two) times daily. 11/26/13  Yes [name], MD  omeprazole (PRILOSEC OTC) 20 MG tablet Take 20 mg by mouth daily.   Yes Historical Provider, MD     ROS: The patient denies  night  sweats, unintentional weight loss, chest pain, palpitations,  wheezing, dyspnea on exertion, nausea, vomiting, dysuria, hematuria, melena, numbness, weakness, or tingling.   All other systems have been reviewed and were otherwise negative with the exception of those mentioned in the HPI and as above.    PHYSICAL EXAM: Filed Vitals:   12/04/13 2044  BP:   Pulse: 121  Temp:   Resp:    Filed Vitals:   12/04/13 1945  Height: 6\' 1"  (1.854 m)  Weight: 181 lb (82.101 kg)   Body mass index is 23.89 kg/(m^2).  General: Alert, mild acute distress HEENT:  Normocephalic, atraumatic, oropharynx patent. EOMI, PERRLA Cardiovascular:  Sinus tachycardia, no rubs murmurs or gallops.  No Carotid bruits, radial pulse intact. No pedal edema.  Respiratory: Clear to auscultation bilaterally.  No wheezes, rales, or rhonchi.  No cyanosis, no use of accessory musculature GI: No organomegaly, abdomen is soft and + tender, positive bowel sounds.  No masses. + incision wounds without any e/o infection, no drainage.  Skin: No rashes. Neurologic: Facial musculature symmetric. Psychiatric: Patient is appropriate throughout our interaction. Lymphatic: No cervical lymphadenopathy Musculoskeletal: Gait intact.   LABS: Results for orders placed in visit on 12/04/13  POCT CBC      Result Value Ref Range   WBC 12.3 (*) 4.6 - 10.2 K/uL   Lymph, poc 1.2  0.6 - 3.4   POC LYMPH PERCENT 9.6 (*) 10 - 50 %L   MID (cbc) 0.5  0 - 0.9   POC MID % 4.3  0 - 12 %M   POC Granulocyte 10.6 (*) 2 - 6.9   Granulocyte percent 86.1 (*) 37 - 80 %G   RBC 4.34 (*) 4.69 - 6.13 M/uL   Hemoglobin 12.8 (*) 14.1 - 18.1 g/dL   HCT, POC 39.7 (*) 43.5 - 53.7 %   MCV 91.5  80 - 97 fL   MCH, POC 29.5  27 - 31.2 pg   MCHC 32.2  31.8 - 35.4 g/dL   RDW, POC 16.5     Platelet Count, POC 298  142 - 424 K/uL   MPV 7.9  0 - 99.8 fL  POCT UA - MICROSCOPIC ONLY      Result Value Ref Range   WBC, Ur, HPF, POC TNTC     RBC, urine, microscopic 4-8     Bacteria, U Microscopic 2+     Mucus, UA neg      Epithelial cells, urine per micros 0-1     Crystals, Ur, HPF, POC neg     Casts, Ur, LPF, POC neg     Yeast, UA nrg    POCT URINALYSIS DIPSTICK      Result Value Ref Range   Color, UA yellow     Clarity, UA clear     Glucose, UA neg     Bilirubin, UA small     Ketones, UA trace     Spec Grav, UA 1.010     Blood, UA large     pH, UA 5.5     Protein, UA 100     Urobilinogen, UA 1.0     Nitrite, UA neg     Leukocytes, UA moderate (2+)       EKG/XRAY:   Primary read interpreted by Dr. Marin Comment at [facility].   ASSESSMENT/PLAN: Encounter Diagnoses  Name Primary?  . Fever, unspecified Yes  . Tachycardia   . Urinary tract infection with hematuria, site unspecified   . Abdominal pain, right upper quadrant   .  Urgency of micturation     Pleasant 58 y/o male with a PMH of renal cell cancer and recurrent UTIs  who recently underwent a  2nd right nephrectomy with right sided colon resection and stent placement in mid April at the [place].  UTI was resistent to keflex, he has been on macrobid for the last 3-4 weeks or more and has allergies to fluoroquinolones, he will require IV abx which we do not have here Have asked for Direct Admit to [facility] Service, D/w Dr Caryl Bis, patient and wife informed of plan and  To [facility] by private vehicle   Gross sideeffects, risk and benefits, and alternatives of medications d/w patient. Patient is aware that all medications have potential sideeffects and we are unable to predict every sideeffect or drug-drug interaction that may occur.  LE, Echo, DO 12/04/2013 9:34 PM

## 2013-12-04 NOTE — H&P (Signed)
[facility] Admission History and Physical Service Pager: [phone]  Patient name: Douglas [name] Medical record number: [id number] Date of birth: September 20, 1955 Age: 58 y.o. Gender: male  Primary Care Nyquan Selbe: Jenny Reichmann, MD Consultants: none Code Status: Full (confirmed on admission)  Chief Complaint: lower abdominal pain, vomiting, tachycardia  Assessment and Plan: [name] is a 58 y.o. male presenting directly from Urgent Care with possible early pyelonephritis, with low-grade fever 100.768F, tachycardia 120s, and concerning UA. PMH is significant for HLRCC (s/p multiple abdominal surgeries, tumor removal), partial R-nephrectomy x2 with R-ureteral stent placement, HTN, HLD, tobacco abuse. Significant recent admission 10/16/13 for pyelonephritis, and recently treated for Enterobacter UTI < 1 month ago.  # Presumed early Pyelonephritis vs complicated UTI, complicated in setting of R-partial nephrectomy with R-ureteral stent - On admission, presenting with chills / low-grade fever to 100.768F (currently afebrile), vomiting x 1, and urinary urgency, but denies dysuria or hematuria. Reviewed labs from UC with mild elevated WBC 12.3, UA (neg nitrite, mod leuk, WBC TNTC). Recent UTI with Enterobacter (cultured, 11/12/13) started on Keflex switched to Macrobid based on sensitivities, completed 3-4x weeks. Repeat urine culture 11/26/13 (negative). Additionally consider viral illness as cause, though less likely given appearance of patient and clinical history. - Admit to FPTS, med-surg - insert IV - CMET / CBC - follow-up Urine culture (from [place], 12/04/13) - ordered Blood Cultures x 2 (prior to antibiotics) - Cefepime IV per pharm - (reviewed sensitivities from last Enterobacter cx, also allergy to FQs) - Plan to de-escalate to PO in 24-48 hours if afebrile, improved, likely to Bactrim - Zofran PRN - f/u fever curve, Tylenol PRN  # Tachycardia - Suspected  secondary to infectious etiology, possible mild dehydration with poor recent PO intake - monitor fever curve - IVF rehydration  # H/o Hereditary Leiomyomatosis / Renal Cell Carcinoma (HLRCC), s/p partial R-nephrectomy - Followed by [facility] surgeon Dr. Thereasa Distance, update as necessary - followed here by Dr Tresa Moore, [facility] - Recent removal of external drain 3 weeks ago, following recent 2nd partial R-nephrectomy  # Chronic lower abdominal pain, R>L - Suspected intermittent sharp abdominal pain, increasing fullness RLQ, likely due to significant history of multiple abdominal surgeries. Notable h/o prior R-inguinal hernia s/p surgical mesh repair (2004). Possible localized edema with resection of large number of lymph nodes - Currently abdomen benign on exam, multiple healed scars - Obtained CT Abd / Pelvis (12/04/13) from outpatient work-up - no acute findings, stable R-ureteral stent - continue to monitor  # HTN - Stable, 140/82 - continue Losartan 50mg  daily  # HLD - Continue atorvastatin 10mg  daily  # H/o left torn rotator cuff - Chronic injury, s/p rehab, currently with improved but still limited function. Eventually plans for shoulder replacement surgery - Continue home Norco, ordered as 5/325, take 1-2 tabs q 6 hr PRN  # COPD - Stable respiratory status on admission, clear lungs, no wheezing, no O2 req - Albuterol nebs q 6 hr PRN  # Tobacco Abuse - Recent smoker, long smoking hx - Continue nicorette gum  FEN/GI: NS @ 125 cc / hr / Protonix PO Prophylaxis: SQ Heparin  Disposition: Admit to FPTS, inpatient status, presumed pyelo vs complicated UTI, start IV antibiotics, pending clinical improvement, f/u urine culture, plan to transition to PO antibiotics, discharge to home in 2-3 days.  History of Present Illness:   Douglas [name] is a 58 y.o. male presenting with complaints of recent chills and subjective fevers at home for  past 1-2 days, with increased urinary urgency.  Additionally decreased appetite, and vomiting x 1 tonight (after dinner, attributes this to drinking contrast media earlier today). Overall, he feels similar to how he did previously before he got sick with UTI requiring hospitalization about 2 months ago. He states that he had the CT scan of his abdomen today, and received call from PCP Dr. Everlene Farrier that it was okay, and after telling him his symptoms, was advised to go to Urgent Care tonight to be checked out. At Urgent Care, they checked his blood and found elevated WBC (POCT test 12.3), and UA (concerning for UTI), also given low grade fever 100.17F, notified FPTS team and accepted for direct admission.  Admits occasional chills, intermittent sharp abdominal pain (RLQ) Denies CP, SOB, HA, nausea, diarrhea.  Of note patient with a relatively complicated PMH. He has [place] and had partial right nephrectomy, ascending colectomy, cholecystectomy, appendectomy, and removal of the fascia overlying the psoas muscle on the right. Pt is s/p stent and drain placement into right renal pelvis (the drain has since been removed). This occurred at [place] in April. He was admitted here shortly after that for hesitancy and uregency. He was found to have an e coli infection and was discharged on keflex. On 5/27 he was seen by Dr Everlene Farrier for urgency and was found to have an enterobacter infection and was treated with a course of macrobid. He had a negative UCx on 6/10. Though at that time he noted on-going RLQ abdominal pain and swelling, so a CT abd was ordered and performed 6/18. This was noted to be without acute changes.  Review Of Systems: Per HPI with the following additions: none Otherwise 12 point review of systems was performed and was unremarkable.  Patient Active Problem List   Diagnosis Date Noted  . Protein-calorie malnutrition, severe 10/17/2013  . Complicated UTI (urinary tract infection) 10/16/2013  . Pyelonephritis 10/16/2013  . Hereditary leiomyomatosis and  renal cell cancer (HLRCC) 08/20/2012  . Renal cell cancer 06/27/2012  . Renal calculi   . GERD (gastroesophageal reflux disease)   . Allergic rhinitis   . Osteoarthritis   . Encounter for long-term (current) use of other medications   . Hyperlipidemia   . Tobacco use disorder   . COPD (chronic obstructive pulmonary disease)   . Unspecified vitamin D deficiency   . Hypertension    Past Medical History: Past Medical History  Diagnosis Date  . Renal calculi   . Compression fracture     S/P L-SPINE; pt does not recall this hx on 10/16/2013  . GERD (gastroesophageal reflux disease)   . History of surgical fusion joint     DDD C-SPINE  . Hemoptysis   . Allergic rhinitis   . Hyperlipidemia   . Encounter for long-term (current) use of other medications   . COPD (chronic obstructive pulmonary disease)     FVC .66  . Tobacco use disorder   . Melanoma in situ   . Unspecified vitamin D deficiency   . Leiomyoma OF SKIN    INCREASED RISK RENAL CELL CA  NEEDS CT OF KIDNEYS EVERY 2 YEARS NEXT DUE  02/06/13  . Hypertension   . Neuromuscular disorder   . History of blood transfusion 09/2013    "think so; not sure; related to big OR"  . Osteoarthritis   . Arthritis     "all over my body" (10/16/2013)  . Chronic back pain     "neck to lower back" (10/16/2013)  .  Depression     "situational since 09/22/2013 OR"  . Melanoma of back   . 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 Renal cell carcinoma     type 2; papillary   Past Surgical History: Past Surgical History  Procedure Laterality Date  . Anterior cruciate ligament repair Bilateral [id number]    [phone]  . Anterior cervical decomp/discectomy fusion  1992  . Hand ligament reconstruction Right   . Robotic assited partial nephrectomy  05/22/2012    Procedure: ROBOTIC ASSITED PARTIAL NEPHRECTOMY;  Surgeon: Alexis Frock, MD;  Location: [facility];  Service: Urology;  Laterality: Right;  Right Robotic Cyst  Decortication and Partial Nephrectomy   . Spine surgery    . Right colectomy Right 09/22/2013  . Cholecystectomy  09/22/2013    "had tumors in it"  . Appendectomy  09/22/2013  . Removal of fascia overlying the right psoas Right 09/22/2013  . Partial nephrectomy Right 09/22/2013  . Renal artery stent Right 09/22/2013  . Lymph node dissection Right 09/22/2013    "in the area of the kidney; they were clean"  . Harvest bone graft Right 1992    "hip; for neck fusion"  . Inguinal hernia repair Right 2004  . Ligament repair Right ~ 2001    "little finger"  . Melanoma excision  ~ 2007    "off my back"   Social History: History  Substance Use Topics  . Smoking status: Current Every Day Smoker -- 0.50 packs/day for 40 years    Types: Cigarettes  . Smokeless tobacco: Never Used     Comment: Pt chewing Nicorette gum currently  . Alcohol Use: Yes     Comment: 10/16/2013 "2-3 beers daily generally; nothing last month or so"   Additional social history: none  Please also refer to relevant sections of EMR.  Family History: Family History  Problem Relation Age of Onset  . Adopted: Yes   Allergies and Medications: Allergies  Allergen Reactions  . Avelox [Moxifloxacin Hcl In Nacl] Hives, Shortness Of Breath and Swelling   No current facility-administered medications on file prior to encounter.   Current Outpatient Prescriptions on File Prior to Encounter  Medication Sig Dispense Refill  . atorvastatin (LIPITOR) 10 MG tablet TAKE 1 BY MOUTH AT BEDTIME  90 tablet  0  . fluticasone (FLONASE) 50 MCG/ACT nasal spray USE 2 SPRAYS IN EACH NOSTRIL AT BEDTIME  48 g  1  . HYDROcodone-acetaminophen (NORCO) 7.5-325 MG per tablet Take one every 8 hours for pain.  42 tablet  0  . losartan (COZAAR) 50 MG tablet TAKE 1 BY MOUTH DAILY BEFORE BREAKFAST  90 tablet  0  . nicotine polacrilex (NICORETTE) 4 MG gum Take 4 mg by mouth as needed for smoking cessation.      . nitrofurantoin, macrocrystal-monohydrate,  (MACROBID) 100 MG capsule Take 1 capsule (100 mg total) by mouth 2 (two) times daily.  20 capsule  0  . omeprazole (PRILOSEC OTC) 20 MG tablet Take 20 mg by mouth daily.        Objective: BP 140/82  Pulse 116  Temp(Src) 98.7 F (37.1 C) (Oral)  Resp 20  Ht 6\' 1"  (1.854 m)  Wt 181 lb (82.101 kg)  BMI 23.89 kg/m2  SpO2 95% Exam: General: well-appearing, sitting up on side of bed, pleasant, conversational, NAD HEENT: NCAT, PERRL, EOMI, oropharynx clear, mild dry MM Cardiovascular: tachycardia, no murmurs Respiratory: CTAB, no wheezing, rhonchi, or focal crackles. Nml effort Abdomen: soft, minimal tenderness RLQ, non-distended,  focal area of mild soft tissue bulging RLQ (larger on standing), multiple healed surgical scars, +active BS MSK: no CVAT Extremities: moves all ext, non-tender, no edema, +2 peripheral pulses intact Skin: warm, dry, no rashes Neuro: awake, alert, oriented, grossly non-focal, gait normal  Labs and Imaging: CBC BMET   Recent Labs Lab 12/05/13 0145  WBC 11.5*  HGB 13.3  HCT 38.8*  PLT 285    Recent Labs Lab 12/05/13 0145  NA 134*  K 3.8  CL 95*  CO2 21  BUN 13  CREATININE 0.99  GLUCOSE 120*  CALCIUM 9.8     UA - neg nitrite, mod leuks, large hgb, neg yeast, WBC TNTC  Urine Culture - (pending)  6/18 CT Abd / Pelvis w/o contrast - (ordered outpatient) IMPRESSION:  No acute abdominal/ pelvic findings, mass lesions or adenopathy.  Stable right-sided double-J ureteral stent.  Blood Culture x 2 (collected 6/18) >> pending  Nobie Putnam, DO 12/05/2013, 12:38 AM PGY-1, [place] Intern pager: [phone], text pages welcome  Upper Level Addendum:  I have seen and evaluated this patient along with Dr. Jerene Pitch and reviewed the above note, making necessary revisions in red.   Tommi Rumps, MD Family Medicine PGY-2

## 2013-12-04 NOTE — Telephone Encounter (Signed)
Emergency Line Phone Call  Received call from Dr Marin Comment regarding patient. Seen in office at this time. Patient with fever and tachycardia. Also with lower right sided abdominal pain. Patient with recent UTI growing enterobacter that was treated with macrobid. Most recent UCx was negative. Dr Marin Comment requests direct admission. Patient with stable blood pressure, though meets SIRS criteria with fever and tachycardia. I called bed control and patient has a bed. Dr Marin Comment informed of this and patient is to come to the ED registration window to be admitted. Will see patient and obtain blood cultures and start antibiotics when the patient arrives.  Tommi Rumps, MD [place] PGY2

## 2013-12-05 ENCOUNTER — Encounter (HOSPITAL_COMMUNITY): Payer: Self-pay | Admitting: *Deleted

## 2013-12-05 DIAGNOSIS — I10 Essential (primary) hypertension: Secondary | ICD-10-CM

## 2013-12-05 LAB — CBC WITH DIFFERENTIAL/PLATELET
BASOS PCT: 0 % (ref 0–1)
Basophils Absolute: 0 10*3/uL (ref 0.0–0.1)
EOS PCT: 4 % (ref 0–5)
Eosinophils Absolute: 0.4 10*3/uL (ref 0.0–0.7)
HEMATOCRIT: 38.8 % — AB (ref 39.0–52.0)
Hemoglobin: 13.3 g/dL (ref 13.0–17.0)
Lymphocytes Relative: 14 % (ref 12–46)
Lymphs Abs: 1.6 10*3/uL (ref 0.7–4.0)
MCH: 30.3 pg (ref 26.0–34.0)
MCHC: 34.3 g/dL (ref 30.0–36.0)
MCV: 88.4 fL (ref 78.0–100.0)
MONO ABS: 0.9 10*3/uL (ref 0.1–1.0)
Monocytes Relative: 8 % (ref 3–12)
Neutro Abs: 8.6 10*3/uL — ABNORMAL HIGH (ref 1.7–7.7)
Neutrophils Relative %: 74 % (ref 43–77)
PLATELETS: 285 10*3/uL (ref 150–400)
RBC: 4.39 MIL/uL (ref 4.22–5.81)
RDW: 15 % (ref 11.5–15.5)
WBC: 11.5 10*3/uL — AB (ref 4.0–10.5)

## 2013-12-05 LAB — COMPLETE METABOLIC PANEL WITH GFR
ALT: 46 U/L (ref 0–53)
AST: 37 U/L (ref 0–37)
Albumin: 4.4 g/dL (ref 3.5–5.2)
CO2: 23 mEq/L (ref 19–32)
Calcium: 9.6 mg/dL (ref 8.4–10.5)
Chloride: 102 mEq/L (ref 96–112)
GFR, Est African American: 85 mL/min
GFR, Est Non African American: 74 mL/min
Glucose, Bld: 127 mg/dL — ABNORMAL HIGH (ref 70–99)
Potassium: 4.2 mEq/L (ref 3.5–5.3)
Sodium: 135 mEq/L (ref 135–145)
Total Protein: 7.1 g/dL (ref 6.0–8.3)

## 2013-12-05 LAB — COMPREHENSIVE METABOLIC PANEL
ALK PHOS: 60 U/L (ref 39–117)
ALT: 41 U/L (ref 0–53)
AST: 29 U/L (ref 0–37)
Albumin: 3.6 g/dL (ref 3.5–5.2)
BUN: 13 mg/dL (ref 6–23)
CHLORIDE: 95 meq/L — AB (ref 96–112)
CO2: 21 mEq/L (ref 19–32)
Calcium: 9.8 mg/dL (ref 8.4–10.5)
Creatinine, Ser: 0.99 mg/dL (ref 0.50–1.35)
GFR calc Af Amer: >90 mL/min (ref >90)
GFR calc non Af Amer: 88 mL/min — ABNORMAL LOW (ref >90)
Glucose, Bld: 120 mg/dL — ABNORMAL HIGH (ref 70–99)
Potassium: 3.8 mEq/L (ref 3.7–5.3)
SODIUM: 134 meq/L — AB (ref 137–147)
TOTAL PROTEIN: 7.5 g/dL (ref 6.0–8.3)
Total Bilirubin: 0.5 mg/dL (ref 0.3–1.2)

## 2013-12-05 LAB — BASIC METABOLIC PANEL
BUN: 11 mg/dL (ref 6–23)
CALCIUM: 9.7 mg/dL (ref 8.4–10.5)
CO2: 23 meq/L (ref 19–32)
CREATININE: 0.87 mg/dL (ref 0.50–1.35)
Chloride: 100 mEq/L (ref 96–112)
GFR calc Af Amer: >90 mL/min (ref >90)
GFR calc non Af Amer: >90 mL/min (ref >90)
Glucose, Bld: 100 mg/dL — ABNORMAL HIGH (ref 70–99)
Potassium: 4.1 mEq/L (ref 3.7–5.3)
Sodium: 139 mEq/L (ref 137–147)

## 2013-12-05 LAB — CBC
HCT: 38.2 % — ABNORMAL LOW (ref 39.0–52.0)
Hemoglobin: 13.1 g/dL (ref 13.0–17.0)
MCH: 30 pg (ref 26.0–34.0)
MCHC: 34.3 g/dL (ref 30.0–36.0)
MCV: 87.4 fL (ref 78.0–100.0)
Platelets: 272 10*3/uL (ref 150–400)
RBC: 4.37 MIL/uL (ref 4.22–5.81)
RDW: 14.7 % (ref 11.5–15.5)
WBC: 9.1 10*3/uL (ref 4.0–10.5)

## 2013-12-05 LAB — COMPLETE METABOLIC PANEL WITHOUT GFR
Alkaline Phosphatase: 53 U/L (ref 39–117)
BUN: 13 mg/dL (ref 6–23)
Creat: 1.1 mg/dL (ref 0.50–1.35)
Total Bilirubin: 0.9 mg/dL (ref 0.2–1.2)

## 2013-12-05 MED ORDER — DEXTROSE 5 % IV SOLN
1.0000 g | Freq: Three times a day (TID) | INTRAVENOUS | Status: DC
  Administered 2013-12-05 (×3): 1 g via INTRAVENOUS
  Filled 2013-12-05 (×6): qty 1

## 2013-12-05 MED ORDER — HYDROCODONE-ACETAMINOPHEN 5-325 MG PO TABS
1.0000 | ORAL_TABLET | Freq: Four times a day (QID) | ORAL | Status: DC | PRN
  Administered 2013-12-05 – 2013-12-06 (×3): 2 via ORAL
  Filled 2013-12-05 (×4): qty 2

## 2013-12-05 MED ORDER — SULFAMETHOXAZOLE-TMP DS 800-160 MG PO TABS
1.0000 | ORAL_TABLET | Freq: Two times a day (BID) | ORAL | Status: DC
Start: 2013-12-06 — End: 2013-12-06
  Administered 2013-12-06: 1 via ORAL
  Filled 2013-12-05 (×2): qty 1

## 2013-12-05 MED ORDER — HYDROXYZINE HCL 25 MG PO TABS
25.0000 mg | ORAL_TABLET | Freq: Three times a day (TID) | ORAL | Status: DC | PRN
  Filled 2013-12-05: qty 1

## 2013-12-05 MED ORDER — HYDROCODONE-ACETAMINOPHEN 5-325 MG PO TABS
2.0000 | ORAL_TABLET | ORAL | Status: AC
  Administered 2013-12-05: 2 via ORAL

## 2013-12-05 NOTE — Progress Notes (Signed)
Family Medicine Teaching Service Daily Progress Note Intern Pager: [phone]  Patient name: Douglas [name] Medical record number: [id number] Date of birth: 1955-07-09 Age: 58 y.o. Gender: male  Primary Care Judit Awad: Jenny Reichmann, MD Consultants: none Code Status: Full (confirmed on admission)  Pt Overview and Major Events to Date:  6/18 - Direct admitted from Urgent Care, concern for pyelo/UTI, HR 120s, UA (+concern infection)         - Started Cefepime IV (6/19>>), Blood / Urine cx 6/19 - Afebrile, WBC 11.5, clinically improved, tolerating PO, plan to transition to Bactrim PO tomorrow  Assessment and Plan: Douglas [name] is a 58 y.o. male presenting directly from Urgent Care with possible early pyelonephritis, with low-grade fever 100.49F, tachycardia 120s, and concerning UA. PMH is significant for HLRCC (s/p multiple abdominal surgeries, tumor removal), partial R-nephrectomy x2 with R-ureteral stent placement, HTN, HLD, tobacco abuse. Significant recent admission 10/16/13 for pyelonephritis, and recently treated for Enterobacter UTI < 1 month ago.  # Presumed early Pyelo vs complicated UTI, complicated with R-partial nephrectomy with R-ureteral stent - Improved - On admission, presenting with chills / low-grade fever to 100.49F (currently afebrile), vomiting x 1, and urinary urgency, but denies dysuria or hematuria. Reviewed labs from UC with mild elevated WBC 12.3, UA (neg nitrite, mod leuk, WBC TNTC). Recent UTI with Enterobacter (cultured, 11/12/13) started on Keflex switched to Macrobid based on sensitivities, completed 3-4x weeks. Repeat urine culture 11/26/13 (negative). Additionally consider viral illness as cause, though less likely given appearance of patient and clinical history.  - Currently stable vitals, afebrile, resolved tachycardia, overall well appearing - Regular diet, tolerating PO - CMET / CBC unremarkable >> WBC 9.1 - follow-up Cultures: Urine (from [facility], 12/04/13) /  Blood x 2 (prior to abx) - Cefepime IV (6/19>>) per pharm - (reviewed sensitivities from last Enterobacter cx, also allergy to FQs)  - Plan to de-escalate to PO in 24 hours if afebrile / improved, likely Bactrim DS BID on 6/20,(total 10 days, last day 6/28) - If patient discharged prior to urine culture results, would need to notify if change antibiotics needed  # Tachycardia - Resolved - Suspected secondary to infectious etiology, possible mild dehydration with poor recent PO intake  - monitor fever curve  - IVF rehydration  # H/o Hereditary Leiomyomatosis / Renal Cell Carcinoma ([place]), s/p partial R-nephrectomy  - Followed by [facility] surgeon Dr. Thereasa Distance, update as necessary  - followed here by Dr Tresa Moore, [facility]  - Recent removal of external drain 3 weeks ago, following recent 2nd partial R-nephrectomy  # Chronic lower abdominal pain, R>L  - Suspected intermittent sharp abdominal pain, increasing fullness RLQ, likely due to significant history of multiple abdominal surgeries. Notable h/o prior R-inguinal hernia s/p surgical mesh repair (2004). Possible localized edema with resection of large number of lymph nodes  - Currently abdomen benign on exam, multiple healed scars  - Obtained CT Abd / Pelvis (12/04/13) from outpatient work-up - no acute findings, stable R-ureteral stent  - No evidence of hernia on CT. Continue to monitor outpatient - Possibly due to R-ureteral stent, to be removed end of July 2015, determine if pain improved at that time  # HTN  - Stable, 140/82  - continue Losartan 50mg  daily   # HLD  - Continue atorvastatin 10mg  daily  # H/o left torn rotator cuff  - Chronic injury, s/p rehab, currently with improved but still limited function. Eventually plans for shoulder replacement surgery  - Continue home Norco,  ordered as 5/325, take 1-2 tabs q 6 hr PRN  # COPD  - Stable respiratory status on admission, clear lungs, no wheezing, no O2 req  - Albuterol nebs q 6  hr PRN  # Tobacco Abuse  - Recent smoker, long smoking hx  - Continue nicorette gum  FEN/GI: NS @ 125 cc / hr / Protonix PO  Prophylaxis: SQ Heparin  Disposition: Admit to FPTS, inpatient status, presumed pyelo vs complicated UTI, start IV antibiotics, pending clinical improvement, f/u urine culture, plan to transition to PO antibiotics tomorrow, discharge to home tomorrow.  Subjective:  Feels really good this morning, did not feel that bad last night either. Has good appetite. Denies nausea. No further vomiting episodes since admission. Denies dysuria. Admits to unchanged intermittent sharp lower abd pain.  Objective: Temp:  [97.9 F (36.6 C)-100.3 F (37.9 C)] 97.9 F (36.6 C) (06/19 0510) Pulse Rate:  [74-128] 74 (06/19 0510) Resp:  [18-20] 18 (06/19 0510) BP: (109-140)/(70-82) 109/73 mmHg (06/19 0510) SpO2:  [94 %-95 %] 95 % (06/19 0510) Weight:  [181 lb (82.101 kg)] 181 lb (82.101 kg) (06/18 2305) Physical Exam: General: well-appearing, resting in bed, conversational, NAD Cardiovascular: RRR, no murmurs (resolved tachycardia) Respiratory: CTAB, no wheezing. nml effort Abdomen: soft, mild discomfort RLQ, non-distended, otherwise non-tender, multiple healed surgical scars, +active BS Extremities: moves all ext, non-tender, no edema Neuro: awake, alert, oriented  Laboratory:  Recent Labs Lab 12/04/13 2046 12/05/13 0145  WBC 12.3* 11.5*  HGB 12.8* 13.3  HCT 39.7* 38.8*  PLT  --  285    Recent Labs Lab 12/05/13 0145  NA 134*  K 3.8  CL 95*  CO2 21  BUN 13  CREATININE 0.99  CALCIUM 9.8  PROT 7.5  BILITOT 0.5  ALKPHOS 60  ALT 41  AST 29  GLUCOSE 120*   UA - neg nitrite, mod leuks, large hgb, neg yeast, WBC TNTC  Urine Culture (collected @ [facility] 12/04/13) - (pending)  6/18 CT Abd / Pelvis w/o contrast - (ordered outpatient)  IMPRESSION:  No acute abdominal/ pelvic findings, mass lesions or adenopathy.  Stable right-sided double-J ureteral  stent.  Blood Culture x 2 (collected 6/18) >> pending   Imaging/Diagnostic Tests:  6/18 CT Abd / Pelvis w/o contrast - (ordered outpatient)  IMPRESSION:  No acute abdominal/ pelvic findings, mass lesions or adenopathy.  Stable right-sided double-J ureteral stent.  Nobie Putnam, DO 12/05/2013, 6:57 AM PGY-1, [place] Intern pager: [phone], text pages welcome

## 2013-12-05 NOTE — H&P (Signed)
Attending Addendum  I examined the patient and discussed the assessment and plan with Dr. Caryl Bis. I have reviewed the note and agree.  Briefly, 58 yo M with a very interesting medical history consisting of renal cell carcinoma s/p extensive resection in April of 2015 admitted with urgency, chills, emesis x one. Patient with Y-type ureteral stent on R and recurrent UTI since April 2015. Mostly recently on daily macrobid for enterobacter. He feels well today. Still with intermittent RLQ pains. No chills, fever, nausea or emesis. Chronic L shoulder joint pain.   BP 109/73  Pulse 74  Temp(Src) 97.9 F (36.6 C) (Oral)  Resp 18  Ht 6\' 1"  (1.854 m)  Wt 181 lb (82.101 kg)  BMI 23.89 kg/m2  SpO2 95% General appearance: alert, cooperative and no distress Back: symmetric, no curvature. ROM normal. No CVA tenderness. Lungs: clear to auscultation bilaterally Heart: regular rate and rhythm, S1, S2 normal, no murmur, click, rub or gallop Abdomen: healed L shape abdominal scar, healed J-P drain scar (RLQ) fullness and tenderness w/o rebound or guarding RLQ more pronounced with standing.    A/P: 58 yo M with urgency chills, nausea and emesis concerning for complicated UTI vs early pyelonephritis.  Improving on Maxipime. Continue IV Abx pending urine culture and sensitivity results.   Boykin Nearing, [place]

## 2013-12-05 NOTE — Progress Notes (Signed)
UR completed 

## 2013-12-05 NOTE — Consult Note (Signed)
ANTIBIOTIC CONSULT NOTE  Pharmacy Consult for  Cefepime Indication:  UTI  Allergies  Allergen Reactions  . Avelox [Moxifloxacin Hcl In Nacl] Hives, Shortness Of Breath and Swelling    Patient Measurements: Height: 6\' 1"  (185.4 cm) Weight: 181 lb (82.101 kg) IBW/kg (Calculated) : 79.9  Dosing Body Weight: 82 kg  Vital Signs: Temp: 98.7 F (37.1 C) (06/18 2305) Temp src: Oral (06/18 2305) BP: 140/82 mmHg (06/18 2305) Pulse Rate: 116 (06/18 2305)  Labs:  Recent Labs  12/04/13 2046  WBC 12.3*  HGB 12.8*    Estimated Creatinine Clearance: 97.8 ml/min (by C-G formula based on Cr of 0.93).  Microbiology: Recent Results (from the past 720 hour(s))  URINE CULTURE     Status: None   Collection Time    11/12/13  9:52 AM      Result Value Ref Range Status   Culture ENTEROBACTER AEROGENES   Final   Colony Count >=100,000 COLONIES/ML   Final   Organism ID, Bacteria ENTEROBACTER AEROGENES   Final  FUNGUS CULTURE W SMEAR     Status: None   Collection Time    11/12/13 10:09 AM      Result Value Ref Range Status   Smear Result No Yeast or Fungal Elements Seen   Preliminary   Preliminary Report CANDIDA ALBICANS   Preliminary  URINE CULTURE     Status: None   Collection Time    11/26/13  3:01 PM      Result Value Ref Range Status   Colony Count NO GROWTH   Final   Organism ID, Bacteria NO GROWTH   Final    Medical History: Past Medical History  Diagnosis Date  . Renal calculi   . Compression fracture     S/P L-SPINE; pt does not recall this hx on 10/16/2013  . GERD (gastroesophageal reflux disease)   . History of surgical fusion joint     DDD C-SPINE  . Hemoptysis   . Allergic rhinitis   . Hyperlipidemia   . Encounter for long-term (current) use of other medications   . COPD (chronic obstructive pulmonary disease)     FVC .66  . Tobacco use disorder   . Melanoma in situ   . Unspecified vitamin D deficiency   . Leiomyoma OF SKIN    INCREASED RISK RENAL CELL CA   NEEDS CT OF KIDNEYS EVERY 2 YEARS NEXT DUE  02/06/13  . Hypertension   . Neuromuscular disorder   . History of blood transfusion 09/2013    "think so; not sure; related to big OR"  . Osteoarthritis   . Arthritis     "all over my body" (10/16/2013)  . Chronic back pain     "neck to lower back" (10/16/2013)  . Depression     "situational since 09/22/2013 OR"  . Melanoma of back   . 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 Renal cell carcinoma     type 2; papillary    Current Medication[s] Include: Medication PTA: Prescriptions prior to admission  Medication Sig Dispense Refill  . atorvastatin (LIPITOR) 10 MG tablet TAKE 1 BY MOUTH AT BEDTIME  90 tablet  0  . fluticasone (FLONASE) 50 MCG/ACT nasal spray USE 2 SPRAYS IN EACH NOSTRIL AT BEDTIME  48 g  1  . HYDROcodone-acetaminophen (NORCO) 7.5-325 MG per tablet Take one every 8 hours for pain.  42 tablet  0  . losartan (COZAAR) 50 MG tablet TAKE 1 BY MOUTH DAILY BEFORE BREAKFAST  90 tablet  0  . nicotine polacrilex (NICORETTE) 4 MG gum Take 4 mg by mouth as needed for smoking cessation.      . nitrofurantoin, macrocrystal-monohydrate, (MACROBID) 100 MG capsule Take 1 capsule (100 mg total) by mouth 2 (two) times daily.  20 capsule  0  . omeprazole (PRILOSEC OTC) 20 MG tablet Take 20 mg by mouth daily.        Scheduled:  Scheduled:  . atorvastatin  10 mg Oral q1800  . heparin  5,000 Units Subcutaneous 3 times per day  . losartan  50 mg Oral Daily  . pantoprazole  40 mg Oral Daily   Infusion[s]: Infusions:  . sodium chloride     Antibiotic[s]: Anti-infectives   None      Assessment:  58 y/o male with an extensive medical history is admitted with UTI.  Recently treated for Enterobacter UTI with Macrobid.  Patient admitted with elevated WBC, Tachycardia, Elevated BP.  Meets criteria for SIRS.  Cefepime to be started per Pharmacy Consult.  Goal of Therapy:  Cefepime selected for UTI and dosed per clinical  parameters and renal function as required.   Plan:  1. Begin Cefepime 1 gm IV q 8 hours. 2. Monitor renal function, WBC, fever curve, any cultures/sensitivities, and clinical progression.  Stramoski, Craig Guess,  Pharm.D     12/05/2013,12:27 AM

## 2013-12-05 NOTE — Discharge Summary (Signed)
[facility] Discharge Summary  Patient name: Douglas [name] Medical record number: [id number] Date of birth: [birth year] Age: 58 y.o. Gender: male Date of Admission: 12/04/2013  Date of Discharge: 12/06/13 Admitting Physician: Minerva Ends, MD  Primary Care Huan Pollok: Jenny Reichmann, MD Consultants: None  Indication for Hospitalization: abdominal pain, tachycardia, nausea / vomiting  Discharge Diagnoses/Problem List:  Presumed early Pyelonephritis vs Complicated UTI, with R-partial nephrectomy, R-ureteral stent - Improved Tachycardia, likely secondary to mild dehydration - Resolved Hereditary Leiomyomatosis / Renal Cell Carcinoma (HLRCC), s/p partial R-nephrectomy Chronic lower abdominal pain, R>L HTN HLD H/o Torn rotator cuff, left COPD Tobacco abuse  Disposition: Home  Discharge Condition: Stable  Discharge Exam: General: well-appearing, sitting at bedside with wife, conversational, NAD  Cardiovascular: RRR  Respiratory: CTAB, nml effort  Abdomen: soft, persistent mild discomfort RLQ, non-distended, otherwise non-tender, multiple healed surgical scars, +active BS  MSK - no CVAT  Extremities: moves all ext, non-tender, no edema  Neuro: awake, alert, oriented  Brief Hospital Course:  Douglas [name] is a 58 y.o. male who presented directly from [facility] with possible early pyelonephritis. PMH is significant for HLRCC (s/p multiple abdominal surgeries, tumor removal), partial R-nephrectomy x2 with R-ureteral stent placement, HTN, HLD, tobacco abuse. Significant recent admission 10/16/13 for pyelonephritis, and recently treated for Enterobacter UTI < 1 month ago.  On admission, patient was overall well-appearing with mild dehydration and presenting with tachycardia, occasional chills, otherwise afebrile, complains of urinary urgency without dysuria. Initial labs (collected at [facility]) with mild elevated WBC 12.3, UA (neg nitrite, mod leuk, WBC TNTC) and Urine  Culture (collected 6/18). Recent UTI with Enterobacter (cultured, 11/12/13) started on Keflex switched to Macrobid based on sensitivities, completed 3-4x weeks. Repeat urine culture 11/26/13 (negative). Started on IVF rehydration and empiric Cefepime IV per pharmacy, after review of prior Enterobacter culture sensitivities, note blood cultures collected (6/18) prior to start of antibiotics. Additionally, with c/o RLQ abdominal pain, somewhat chronic in nature, just completed an outpatient CT Abd scan (on day of admission, 6/18), which was read as stable without acute findings.  During hospitalization, patient demonstrated significant improvement within 24 hours, remained afebrile, stable vitals, WBC improved, tolerated PO without any nausea / vomiting, resolved urinary symptoms, but stable intermittent abdominal pain. Given clinical improvement, transitioned from Cefepime IV to Bactrim-DS PO. Prior to discharge, contacted PCP Dr. Everlene Farrier to provide updates and confirm follow-up plans for pending Urine Culture (collected at [facility]), pending results on discharge. Patient discharged on Bactrim PO course, with plans for close follow-up.  Issues for Follow Up:  1. Urine Culture (12/04/13, collected at [facility]) - Updated results after discharge with "Yeast - 20,000 colonies", will contact Dr. Everlene Farrier to notify of results, recommend discontinuing Bactrim PO course, and starting Fluconazole with follow-up UA / Urine culture.  2. [facility] / Ureteral Stent follow-up  3. RLQ Abdominal Pain / Fullness - Outpatient CT negative. Pt with prior R-inguinal hernia (repaired with mesh), abdomen complicated with h/o multiple surgeries. Concern for possible hernia / weakened abdominal wall as etiology of pain.  Significant Procedures: none  Significant Labs and Imaging:   Recent Labs Lab 12/04/13 2046 12/05/13 0145 12/05/13 0825  WBC 12.3* 11.5* 9.1  HGB 12.8* 13.3 13.1  HCT 39.7* 38.8* 38.2*  PLT  --  285 272    Recent Labs Lab  12/04/13 2046 12/05/13 0145 12/05/13 0825  NA 135 134* 139  K 4.2 3.8 4.1  CL 102 95* 100  CO2 23 21 23  GLUCOSE 127* 120* 100*  BUN 13 13 11   CREATININE 1.10 0.99 0.87  CALCIUM 9.6 9.8 9.7  ALKPHOS 53 60  --   AST 37 29  --   ALT 46 41  --   ALBUMIN 4.4 3.6  --    UA - neg nitrite, mod leuks, large hgb, neg yeast, WBC TNTC  Urine Culture (collected @ [facility] 12/04/13) - Yeast, 20k colonies  Blood Culture x 2 (collected 6/18) >> NGTD  Imaging/Diagnostic Tests:  6/18 CT Abd / Pelvis w/o contrast - (ordered outpatient)  IMPRESSION:  No acute abdominal/ pelvic findings, mass lesions or adenopathy.  Stable right-sided double-J ureteral stent.  Results/Tests Pending at Time of Discharge:  1. Blood Culture x 2 (collected 6/18) >> NGTD  Discharge Medications:    Medication List         atorvastatin 10 MG tablet  Commonly known as:  LIPITOR  Take 10 mg by mouth at bedtime.     B-complex with vitamin C tablet  Take 1 tablet by mouth daily.     cholecalciferol 1000 UNITS tablet  Commonly known as:  VITAMIN D  Take 1,000 Units by mouth daily.     fluticasone 50 MCG/ACT nasal spray  Commonly known as:  FLONASE  Place 1 spray into both nostrils at bedtime.     HYDROcodone-acetaminophen 7.5-325 MG per tablet  Commonly known as:  NORCO  Take 1 tablet by mouth every 4 (four) hours as needed for moderate pain.     losartan 50 MG tablet  Commonly known as:  COZAAR  Take 50 mg by mouth daily before breakfast.     multivitamin with minerals Tabs tablet  Take 1 tablet by mouth daily.     nicotine polacrilex 4 MG gum  Commonly known as:  NICORETTE  Take 4 mg by mouth as needed for smoking cessation.     omeprazole 20 MG tablet  Commonly known as:  PRILOSEC OTC  Take 20 mg by mouth daily.     sulfamethoxazole-trimethoprim 800-160 MG per tablet  Commonly known as:  BACTRIM DS  Take 1 tablet by mouth every 12 (twelve) hours.     vitamin C 500 MG tablet   Commonly known as:  ASCORBIC ACID  Take 500 mg by mouth daily.        Discharge Instructions: Please refer to Patient Instructions section of EMR for full details.  Patient was counseled important signs and symptoms that should prompt return to medical care, changes in medications, dietary instructions, activity restrictions, and follow up appointments.   Follow-Up Appointments: Follow-up Information   Follow up with DAUB, [name] A, MD. Schedule an appointment as soon as possible for a visit in 1 week. (for hospital follow-up)    Specialty:  Family Medicine   Contact information:   [place] [address] [phone]       Nobie Putnam, DO 12/07/2013, 4:05 PM PGY-1, [place]

## 2013-12-05 NOTE — Progress Notes (Signed)
FMTS Attending Daily Note:  Annabell Sabal MD  [phone] pager  Family Practice pager:  [phone] I have discussed this patient with the resident Dr. Parks Ranger and attending physician Dr. Adrian Blackwater.  I agree with their findings, assessment, and care plan.  Rehydrating and awaiting cultures.  24 hours on IV antibiotics with eye towards moving to PO antibiotics this PM.  If continues improving on PO antibiotics look to DC home toomorrow

## 2013-12-06 DIAGNOSIS — E43 Unspecified severe protein-calorie malnutrition: Secondary | ICD-10-CM

## 2013-12-06 LAB — URINE CULTURE

## 2013-12-06 MED ORDER — SULFAMETHOXAZOLE-TMP DS 800-160 MG PO TABS: 1.0000 | ORAL_TABLET | Freq: Two times a day (BID) | ORAL | Status: DC

## 2013-12-06 NOTE — Discharge Instructions (Signed)
You were hospitalized directly from Urgent Care due to concern for a complicated Urinary Tract Infection vs possible Pyelonephritis. Given your complex kidney history, we agreed with the plan for IV fluids, IV antibiotics, and close observation. Overall, it looks like you've demonstrated great improvement over 48 hours and meet criteria to discharge home. - We have transitioned you to oral antibiotics (Bactrim-DS, take 1 tablet every 12 hours or twice daily for the next 8 days, last day of therapy is on 12/14/13 - The Urine Culture results are still "pending". I have called this lab this morning, and they say it is expected to have Preliminary results tomorrow on Sunday, however we may not know the antibiotic results until Monday or Tuesday. - I have called Dr. Everlene Farrier and provided him these updates. He will plan to follow-up the Urine Culture as soon as it becomes available early next week and will notify you about any potential antibiotic change at that time. - Dr. Everlene Farrier says that he is working all next week, and you are welcome to return for a follow-up visit at anytime next week. Perhaps, if you wait until at least Tues or Wednesday, then they may have the results for you. I will leave this up to you and how you feel. - For your abdominal pain / fullness, it does not seem urgent at this time (given a normal CT scan), it still may be an early hernia (please discuss this further with Dr. Everlene Farrier), otherwise you may have some further answers after you see your doctor at [facility]  If you develop worsening symptoms, fever/chills, nausea/vomiting, burning with urination, please return to Urgent Care or call with any concerns.

## 2013-12-06 NOTE — Progress Notes (Signed)
CALL PAGER [phone] for any questions or notifications regarding this patient   FMTS Attending Daily Note: Dorcas Mcmurray MD Attending pager: [phone]  office [phone] I have discussed this patient with the resident and reviewed the assessment and plan as documented above. I agree with the resident's findings and plan. We started oral antibiotics last evening and he has been afebrile overnight. We are going to discharge him with close followup by his PCP. This seems remarkably similar to a prior episode. His PCP is following the cultures that were obtained from urgent care. We will keep him on a 10 day course of oral antibiotics, Bactrim DS twice daily.

## 2013-12-06 NOTE — Progress Notes (Signed)
Family Medicine Teaching Service Daily Progress Note Intern Pager: [phone]  Patient name: Fermin Yan Medical record number: [id number] Date of birth: 06-07-1956 Age: 58 y.o. Gender: male  Primary Care Provider: Jenny Reichmann, MD Consultants: none Code Status: Full (confirmed on admission)  Pt Overview and Major Events to Date:  6/18 - Direct admitted from Urgent Care, concern for pyelo/UTI, HR 120s, UA (+concern infection)         - Started Cefepime IV (6/19>>), Blood / Urine cx 6/19 - Afebrile, WBC 11.5 >> 9.1, clinically improved, tolerating PO, plan to transition to Bactrim PO tomorrow 6/20 - Afebrile, start Bactrim PO (DC'd Cefepime IV x 24 hrs), anticipate discharge today  Assessment and Plan: Nyaire Denbleyker is a 58 y.o. male presenting directly from Urgent Care with possible early pyelonephritis, with low-grade fever 100.38F, tachycardia 120s, and concerning UA. PMH is significant for HLRCC (s/p multiple abdominal surgeries, tumor removal), partial R-nephrectomy x2 with R-ureteral stent placement, HTN, HLD, tobacco abuse. Significant recent admission 10/16/13 for pyelonephritis, and recently treated for Enterobacter UTI < 1 month ago.  # Presumed early Pyelo vs complicated UTI, with R-partial nephrectomy with R-ureteral stent - Improved - On admission, presenting with chills / low-grade fever to 100.38F (currently afebrile), vomiting x 1, and urinary urgency, but denies dysuria or hematuria. Reviewed labs from UC with mild elevated WBC 12.3, UA (neg nitrite, mod leuk, WBC TNTC). Recent UTI with Enterobacter (cultured, 11/12/13) started on Keflex switched to Macrobid based on sensitivities, completed 3-4x weeks. Repeat urine culture 11/26/13 (negative). - Currently stable vitals, remains afebrile, well-appearing at baseline - Regular diet, tolerating PO - Improved WBC >> 9.1 - follow-up Cultures: Urine (from [facility], 12/04/13) / Blood x 2 (prior to abx) - Start Bactrim-DS PO BID  (total 10 day abx course, last day 12/14/13), DC'd Cefepime IV x 24 hrs - (reviewed sensitivities from last Enterobacter cx, also allergy to FQs) - If patient discharged prior to urine culture results, would need to notify if change antibiotics needed  # Tachycardia - Resolved - Suspected secondary to infectious etiology, possible mild dehydration with poor recent PO intake  - resolution s/p IVF rehydration  # H/o Hereditary Leiomyomatosis / Renal Cell Carcinoma ([place]), s/p partial R-nephrectomy  - Followed [facility] Dr. Thereasa Distance, update as necessary  - followed here by Dr Tresa Moore, [facility]  - Recent removal of external drain 3 weeks ago, following recent 2nd partial R-nephrectomy  # Chronic lower abdominal pain, R>L  - Suspected intermittent sharp abdominal pain, increasing fullness RLQ, likely due to significant history of multiple abdominal surgeries. Notable h/o prior R-inguinal hernia s/p surgical mesh repair (2004). Possible localized edema with resection of large number of lymph nodes  - Currently abdomen benign on exam, multiple healed scars  - Obtained CT Abd / Pelvis (12/04/13) from outpatient work-up - no acute findings, stable R-ureteral stent  - No evidence of hernia on CT. Continue to monitor outpatient - Possibly due to R-ureteral stent, to be removed end of July 2015, determine if pain improved at that time  # HTN  - Stable, 100-140s / 70-80s  - continue Losartan 50mg  daily   # HLD  - Continue atorvastatin 10mg  daily  # H/o left torn rotator cuff  - Chronic injury, s/p rehab, currently with improved but still limited function. Eventually plans for shoulder replacement surgery  - Continue home Norco, ordered as 5/325, take 1-2 tabs q 6 hr PRN  # COPD  - Stable respiratory status  on admission, clear lungs, no wheezing, no O2 req  - Albuterol nebs q 6 hr PRN  # Tobacco Abuse  - Recent smoker, long smoking hx  - Continue nicorette gum  FEN/GI: SLIV /  Protonix PO  Prophylaxis: SQ Heparin  Disposition: Admit to FPTS, inpatient status, presumed pyelo vs complicated UTI, initially on IV antibiotics, significant clinical improvement, pending urine culture, transition to PO antibiotics today with plans for discharge home, follow-up urine culture.  Subjective: Patient reports still feeling well today. Wife at bedside, agrees that he is near baseline. Improved appetite, tolerating regular diet, denies fever/chills, dysuria, vomiting, flank pain. Admits persistent intermittent brief sharp RLQ abd pain (unchanged from admission). Already spoke with Dr. Everlene Farrier regarding hospital course and follow-up plan, arranged to do walk-in appointment on Monday for f/u.  Objective: Temp:  [97.9 F (36.6 C)-98.1 F (36.7 C)] 97.9 F (36.6 C) (06/20 0603) Pulse Rate:  [67-93] 67 (06/20 0603) Resp:  [18] 18 (06/20 0603) BP: (100-122)/(70-85) 108/75 mmHg (06/20 0603) SpO2:  [93 %-97 %] 96 % (06/20 0603) Weight:  [181 lb 8 oz (82.328 kg)] 181 lb 8 oz (82.328 kg) (06/19 2221) Physical Exam: General: well-appearing, sitting at bedside with wife, conversational, NAD Cardiovascular: RRR Respiratory: CTAB, nml effort Abdomen: soft, persistent mild discomfort RLQ, non-distended, otherwise non-tender, multiple healed surgical scars, +active BS MSK - no CVAT Extremities: moves all ext, non-tender, no edema Neuro: awake, alert, oriented  Laboratory:  Recent Labs Lab 12/04/13 2046 12/05/13 0145 12/05/13 0825  WBC 12.3* 11.5* 9.1  HGB 12.8* 13.3 13.1  HCT 39.7* 38.8* 38.2*  PLT  --  285 272    Recent Labs Lab 12/04/13 2046 12/05/13 0145 12/05/13 0825  NA 135 134* 139  K 4.2 3.8 4.1  CL 102 95* 100  CO2 23 21 23   BUN 13 13 11   CREATININE 1.10 0.99 0.87  CALCIUM 9.6 9.8 9.7  PROT 7.1 7.5  --   BILITOT 0.9 0.5  --   ALKPHOS 53 60  --   ALT 46 41  --   AST 37 29  --   GLUCOSE 127* 120* 100*   UA - neg nitrite, mod leuks, large hgb, neg yeast, WBC  TNTC  Urine Culture (collected @ [facility] 12/04/13) - (pending)  6/18 CT Abd / Pelvis w/o contrast - (ordered outpatient)  IMPRESSION:  No acute abdominal/ pelvic findings, mass lesions or adenopathy.  Stable right-sided double-J ureteral stent.  Blood Culture x 2 (collected 6/18) >> pending  Imaging/Diagnostic Tests:  6/18 CT Abd / Pelvis w/o contrast - (ordered outpatient)  IMPRESSION:  No acute abdominal/ pelvic findings, mass lesions or adenopathy.  Stable right-sided double-J ureteral stent.  Nobie Putnam, DO 12/06/2013, 9:45 AM PGY-1, [place] Intern pager: [phone], text pages welcome

## 2013-12-08 ENCOUNTER — Telehealth: Payer: Self-pay | Admitting: Emergency Medicine

## 2013-12-08 ENCOUNTER — Ambulatory Visit (INDEPENDENT_AMBULATORY_CARE_PROVIDER_SITE_OTHER): Payer: BC Managed Care – PPO | Admitting: Emergency Medicine

## 2013-12-08 VITALS — BP 124/82 | HR 91 | Temp 98.5°F | Resp 14 | Ht 72.0 in | Wt 180.4 lb

## 2013-12-08 DIAGNOSIS — N390 Urinary tract infection, site not specified: Secondary | ICD-10-CM

## 2013-12-08 DIAGNOSIS — D4819 Other specified neoplasm of uncertain behavior of connective and other soft tissue: Secondary | ICD-10-CM

## 2013-12-08 DIAGNOSIS — C641 Malignant neoplasm of right kidney, except renal pelvis: Secondary | ICD-10-CM

## 2013-12-08 DIAGNOSIS — D481 Neoplasm of uncertain behavior of connective and other soft tissue: Secondary | ICD-10-CM

## 2013-12-08 DIAGNOSIS — C649 Malignant neoplasm of unspecified kidney, except renal pelvis: Secondary | ICD-10-CM

## 2013-12-08 LAB — POCT URINALYSIS DIPSTICK
BILIRUBIN UA: NEGATIVE
GLUCOSE UA: NEGATIVE
Ketones, UA: NEGATIVE
Leukocytes, UA: NEGATIVE
NITRITE UA: NEGATIVE
Protein, UA: NEGATIVE
SPEC GRAV UA: 1.01
Urobilinogen, UA: 0.2
pH, UA: 5.5

## 2013-12-08 LAB — POCT CBC
Granulocyte percent: 59.5 %G (ref 37–80)
HCT, POC: 41.5 % — AB (ref 43.5–53.7)
HEMOGLOBIN: 13.1 g/dL — AB (ref 14.1–18.1)
Lymph, poc: 1.8 (ref 0.6–3.4)
MCH, POC: 29 pg (ref 27–31.2)
MCHC: 31.6 g/dL — AB (ref 31.8–35.4)
MCV: 92.1 fL (ref 80–97)
MID (cbc): 0.4 (ref 0–0.9)
MPV: 8 fL (ref 0–99.8)
POC GRANULOCYTE: 3.3 (ref 2–6.9)
POC LYMPH PERCENT: 32.8 %L (ref 10–50)
POC MID %: 7.7 %M (ref 0–12)
Platelet Count, POC: 380 10*3/uL (ref 142–424)
RBC: 4.51 M/uL — AB (ref 4.69–6.13)
RDW, POC: 17.1 %
WBC: 5.6 10*3/uL (ref 4.6–10.2)

## 2013-12-08 LAB — POCT UA - MICROSCOPIC ONLY
BACTERIA, U MICROSCOPIC: NEGATIVE
Casts, Ur, LPF, POC: NEGATIVE
Crystals, Ur, HPF, POC: NEGATIVE
MUCUS UA: NEGATIVE
YEAST UA: NEGATIVE

## 2013-12-08 LAB — FUNGUS CULTURE W SMEAR: SMEAR RESULT: NONE SEEN

## 2013-12-08 MED ORDER — HYDROCODONE-ACETAMINOPHEN 7.5-325 MG PO TABS: 1.0000 | ORAL_TABLET | ORAL | Status: DC | PRN

## 2013-12-08 NOTE — Progress Notes (Signed)
   Subjective:    Patient ID: Douglas [name], male    DOB: 17-Jul-1955, 58 y.o.   MRN: [id number]  HPI 58 year old male pt here for a follow up. He is going to have PET and CT scans done the last week in July. He is planning on having his stent taken out the last week in July. Patient has a complicated history of urinary tract infections following surgical resection of recurrent renal cancer. His initial culture was sensitive to Keflex however followup culture was sensitive to Macrobid and Septra. He was subsequently treated with Macrobid and then developed fever tachycardia and was readmitted and placed on Septra. Cultures have also always grown Candida. He has a persistent fullness in the right lower abdomen.   Review of Systems     Objective:   Physical Exam patient is alert and cooperative he is not ill appearing today. His neck is supple. His chest is clear heart regular rate no murmurs. There are well-healed surgical scars over the entire right side of the abdomen and in the midline. He has a healed portal site. There is tenderness in the right lower abdomen with a distinctive fullness in this area but no mass formation Results for orders placed in visit on 12/08/13  POCT CBC      Result Value Ref Range   WBC 5.6  4.6 - 10.2 K/uL   Lymph, poc 1.8  0.6 - 3.4   POC LYMPH PERCENT 32.8  10 - 50 %L   MID (cbc) 0.4  0 - 0.9   POC MID % 7.7  0 - 12 %M   POC Granulocyte 3.3  2 - 6.9   Granulocyte percent 59.5  37 - 80 %G   RBC 4.51 (*) 4.69 - 6.13 M/uL   Hemoglobin 13.1 (*) 14.1 - 18.1 g/dL   HCT, POC 41.5 (*) 43.5 - 53.7 %   MCV 92.1  80 - 97 fL   MCH, POC 29.0  27 - 31.2 pg   MCHC 31.6 (*) 31.8 - 35.4 g/dL   RDW, POC 17.1     Platelet Count, POC 380  142 - 424 K/uL   MPV 8.0  0 - 99.8 fL  POCT UA - MICROSCOPIC ONLY      Result Value Ref Range   WBC, Ur, HPF, POC 2-6     RBC, urine, microscopic 1-3     Bacteria, U Microscopic negative     Mucus, UA negative     Epithelial cells,  urine per micros 0-1     Crystals, Ur, HPF, POC negative     Casts, Ur, LPF, POC negative     Yeast, UA negative          Assessment & Plan:  We'll finish out Septra for now. I spoke with Dr. Megan Salon they will see him in ID clinic. He has to complete  his full ten-day course of Septra. We'll see him back next week. He will be out of work this week next week he will work for four days.

## 2013-12-08 NOTE — Discharge Summary (Signed)
Family Medicine Teaching Service  Discharge Note : Attending Sara Neal MD Pager [phone] Office [phone] I have seen and examined this patient, reviewed their chart and discussed discharge planning wit the resident at the time of discharge. I agree with the discharge plan as above.  

## 2013-12-08 NOTE — Telephone Encounter (Signed)
Called patient to inform him that I have received a faxed document for his disability claim. I am placing this in Dr. Perfecto Kingdom box and will fax back to [facility] upon completion.

## 2013-12-10 LAB — URINE CULTURE
Colony Count: NO GROWTH
ORGANISM ID, BACTERIA: NO GROWTH

## 2013-12-11 LAB — CULTURE, BLOOD (ROUTINE X 2)
CULTURE: NO GROWTH
Culture: NO GROWTH

## 2013-12-11 LAB — CULTURE, FUNGUS WITHOUT SMEAR

## 2013-12-12 NOTE — Telephone Encounter (Signed)
Pt sent a text message saying he feels like his urinary infection is coming back. He feels like "he has to pee real bad." He also wanted to double check that his antibiotics did not change and have a new rx waiting at the pharmacy. He states he does not have a fever. He will see Dr. Megan Salon on Tuesday.   The last culture grew yeast again. You're due to see Dr. Megan Salon on Tuesday regarding treatment for yeast infection. Continue to drink large quantities of water and finish the antibiotics you already have. Be sure you also take probiotics twice a day along with your antibiotics

## 2013-12-12 NOTE — Telephone Encounter (Signed)
LM for pt to rtn call- finish antibiotic, drink fluids and reminding of appt on Tuesday.

## 2013-12-15 ENCOUNTER — Telehealth: Payer: Self-pay | Admitting: Family Medicine

## 2013-12-15 NOTE — Telephone Encounter (Signed)
Okay to put a note that states patient can return to work for a day starting on 12/15/2013. This will continue until 12/22/2013 at which time he can resume eight hour days.

## 2013-12-15 NOTE — Telephone Encounter (Signed)
Return to work note done. Patient will pick up today

## 2013-12-15 NOTE — Telephone Encounter (Signed)
Called to status. States he is doing ok. Took his last ABX last night but thinks infection is coming back. He has appt with Dr. Megan Salon (ID) tomorrow. He went to work today, half day. He needs a note for work stating it is on for him to work half days this week and return to FT next week.

## 2013-12-16 ENCOUNTER — Encounter: Payer: Self-pay | Admitting: Internal Medicine

## 2013-12-16 ENCOUNTER — Ambulatory Visit (INDEPENDENT_AMBULATORY_CARE_PROVIDER_SITE_OTHER): Payer: BC Managed Care – PPO | Admitting: Internal Medicine

## 2013-12-16 VITALS — BP 118/82 | HR 87 | Temp 98.1°F | Ht 73.0 in | Wt 186.2 lb

## 2013-12-16 DIAGNOSIS — N390 Urinary tract infection, site not specified: Secondary | ICD-10-CM

## 2013-12-16 NOTE — Progress Notes (Signed)
Patient ID: Douglas [name], male   DOB: 30-Jan-1956, 58 y.o.   MRN: [id number]         [facility]  Reason for Consult: Recurrent urinary tract infections Referring Physician: Dr. [name]  Patient Active Problem List   Diagnosis Date Noted  . Complicated UTI (urinary tract infection) 10/16/2013    Priority: High  . 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) 08/20/2012    Priority: High  . Protein-calorie malnutrition, severe 10/17/2013  . Pyelonephritis 10/16/2013  . Renal cell cancer 06/27/2012  . Renal calculi   . GERD (gastroesophageal reflux disease)   . Allergic rhinitis   . Osteoarthritis   . Encounter for long-term (current) use of other medications   . Hyperlipidemia   . Tobacco use disorder   . COPD (chronic obstructive pulmonary disease)   . Unspecified vitamin D deficiency   . Hypertension     Patient's Medications  New Prescriptions   No medications on file  Previous Medications   ATORVASTATIN (LIPITOR) 10 MG TABLET    Take 10 mg by mouth at bedtime.   B COMPLEX-C (B-COMPLEX WITH VITAMIN C) TABLET    Take 1 tablet by mouth daily.   CHOLECALCIFEROL (VITAMIN D) 1000 UNITS TABLET    Take 1,000 Units by mouth daily.   FLUTICASONE (FLONASE) 50 MCG/ACT NASAL SPRAY    Place 1 spray into both nostrils at bedtime.   HYDROCODONE-ACETAMINOPHEN (NORCO) 7.5-325 MG PER TABLET    Take 1 tablet by mouth every 4 (four) hours as needed for moderate pain.   LOSARTAN (COZAAR) 50 MG TABLET    Take 50 mg by mouth daily before breakfast.   MULTIPLE VITAMIN (MULTIVITAMIN WITH MINERALS) TABS TABLET    Take 1 tablet by mouth daily.   NICOTINE POLACRILEX (NICORETTE) 4 MG GUM    Take 4 mg by mouth as needed for smoking cessation.   OMEPRAZOLE (PRILOSEC OTC) 20 MG TABLET    Take 20 mg by mouth daily.   VITAMIN C (ASCORBIC ACID) 500 MG TABLET    Take 500 mg by mouth daily.  Modified Medications   No medications on file  Discontinued  Medications   SULFAMETHOXAZOLE-TRIMETHOPRIM (BACTRIM DS) 800-160 MG PER TABLET    Take 1 tablet by mouth every 12 (twelve) hours.    Recommendations: 1. Repeat UA and urine culture. I will call him with the results. 2. Observe off of antibiotics for now   Assessment: He has had recurrent urinary tract infections with a variety of organisms since his recent complex renal surgery. His double-J stent and altered anatomy are likely to contribute to increased risk of infection. The early relapse after recent treatment with trimethoprim sulfamethoxazole may be due to a symptomatic candidal cystitis. I will repeat UA and cultures and make a decision about treatment.  HPI: Douglas [name] is a 58 y.o. male who has hereditary leiomyomatosis with renal cell cancer. He underwent partial right nephrectomy in 2013 blood developed a local recurrences late last year. He underwent repeat surgery at the [facility] on 09/22/2013. He had more of his right kidney removed as well as a right hemicolectomy, cholecystectomy, appendectomy and debridement of lymph nodes along the psoas muscle. A double-J stent was placed as well as an external Jackson-Pratt drain. He returned home to [facility] but developed fever and dysuria and was admitted to the hospital with an Escherichia coli UTI. He was discharged on oral cephalexin and improved.  In late May he began to have similar  symptoms along with urinary urgency and was found to have Enterobacter and Candida albicans in his urine. He was treated with nitrofurantoin and improved. A repeat urine culture on June 10 was negative.  He began to have fever, chills, dysuria and urgency again and was readmitted on June 18. His urine culture grew yeast. His discharge summary he states that he was going to be treated with trimethoprim sulfamethoxazole and fluconazole. He never filled the fluconazole but completed the trimethoprim sulfamethoxazole 3 days ago. He was feeling better but is already  starting to have a return of his urinary urgency. He's not had any more fever, chills or sweats.  Review of Systems: Constitutional: negative Eyes: negative Ears, nose, mouth, throat, and face: negative Respiratory: negative Cardiovascular: negative Gastrointestinal: negative Genitourinary:positive for dysuria and urgency, negative for frequency, hematuria, hesitancy and urinary incontinence    Past Medical History  Diagnosis Date  . Renal calculi   . Compression fracture     S/P L-SPINE; pt does not recall this hx on 10/16/2013  . GERD (gastroesophageal reflux disease)   . History of surgical fusion joint     DDD C-SPINE  . Hemoptysis   . Allergic rhinitis   . Hyperlipidemia   . Encounter for long-term (current) use of other medications   . COPD (chronic obstructive pulmonary disease)     FVC .66  . Tobacco use disorder   . Melanoma in situ   . Unspecified vitamin D deficiency   . Leiomyoma OF SKIN    INCREASED RISK RENAL CELL CA  NEEDS CT OF KIDNEYS EVERY 2 YEARS NEXT DUE  02/06/13  . Hypertension   . Neuromuscular disorder   . History of blood transfusion 09/2013    "think so; not sure; related to big OR"  . Osteoarthritis   . Arthritis     "all over my body" (10/16/2013)  . Chronic back pain     "neck to lower back" (10/16/2013)  . Depression     "situational since 09/22/2013 OR"  . Melanoma of back   . 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 Renal cell carcinoma     type 2; papillary    History  Substance Use Topics  . Smoking status: Current Every Day Smoker -- 0.50 packs/day for 40 years    Types: Cigarettes  . Smokeless tobacco: Never Used     Comment: Pt chewing Nicorette gum currently  . Alcohol Use: Yes     Comment: 10/16/2013 "2-3 beers daily generally; nothing last month or so"    Family History  Problem Relation Age of Onset  . Adopted: Yes  . Hypertension Father    Allergies  Allergen Reactions  . Avelox [Moxifloxacin Hcl  In Nacl] Hives, Shortness Of Breath and Swelling    OBJECTIVE: Blood pressure 118/82, pulse 87, temperature 98.1 F (36.7 C), temperature source Oral, height 6\' 1"  (1.854 m), weight 186 lb 4 oz (84.482 kg). General: He is in very good spirits Skin: No rash Lungs: Clear Cor: Regular S1-S2 with no murmurs Abdomen: Healed midline and right lower quadrant incisions. His Jackson-Pratt drain has been removed and the site has healed. He has no tenderness with palpation. He has no CVA or suprapubic tenderness   Microbiology: Recent Results (from the past 240 hour(s))  URINE CULTURE     Status: None   Collection Time    12/08/13 12:29 PM      Result Value Ref Range Status   Colony Count NO  GROWTH   Final   Organism ID, Bacteria NO GROWTH   Final  CULTURE, FUNGUS WITHOUT SMEAR     Status: None   Collection Time    12/08/13 12:29 PM      Result Value Ref Range Status   Organism ID, Bacteria CANDIDA ALBICANS   Final    Michel Bickers, MD [facility] [phone] pager   [phone] cell 12/16/2013, 4:47 PM

## 2013-12-17 LAB — URINALYSIS, MICROSCOPIC ONLY
CASTS: NONE SEEN
Crystals: NONE SEEN
WBC, UA: >50 WBC/hpf — AB (ref <3)

## 2013-12-17 LAB — URINALYSIS, ROUTINE W REFLEX MICROSCOPIC
Bilirubin Urine: NEGATIVE
Glucose, UA: NEGATIVE mg/dL
KETONES UR: NEGATIVE mg/dL
Nitrite: POSITIVE — AB
PROTEIN: NEGATIVE mg/dL
Specific Gravity, Urine: 1.022 (ref 1.005–1.030)
UROBILINOGEN UA: 0.2 mg/dL (ref 0.0–1.0)
pH: 5.5 (ref 5.0–8.0)

## 2013-12-18 ENCOUNTER — Ambulatory Visit (INDEPENDENT_AMBULATORY_CARE_PROVIDER_SITE_OTHER): Payer: BC Managed Care – PPO | Admitting: Emergency Medicine

## 2013-12-18 ENCOUNTER — Encounter: Payer: Self-pay | Admitting: Emergency Medicine

## 2013-12-18 ENCOUNTER — Telehealth: Payer: Self-pay | Admitting: Internal Medicine

## 2013-12-18 VITALS — BP 110/76 | HR 69 | Temp 98.1°F | Resp 16 | Ht 72.0 in | Wt 180.6 lb

## 2013-12-18 DIAGNOSIS — R1031 Right lower quadrant pain: Secondary | ICD-10-CM

## 2013-12-18 DIAGNOSIS — N390 Urinary tract infection, site not specified: Secondary | ICD-10-CM

## 2013-12-18 DIAGNOSIS — M25512 Pain in left shoulder: Secondary | ICD-10-CM

## 2013-12-18 DIAGNOSIS — M25519 Pain in unspecified shoulder: Secondary | ICD-10-CM

## 2013-12-18 DIAGNOSIS — C641 Malignant neoplasm of right kidney, except renal pelvis: Secondary | ICD-10-CM

## 2013-12-18 DIAGNOSIS — C649 Malignant neoplasm of unspecified kidney, except renal pelvis: Secondary | ICD-10-CM

## 2013-12-18 LAB — URINE CULTURE: Colony Count: >=100000

## 2013-12-18 MED ORDER — CEPHALEXIN 500 MG PO CAPS: 500.0000 mg | ORAL_CAPSULE | Freq: Four times a day (QID) | ORAL | Status: DC

## 2013-12-18 MED ORDER — HYDROCODONE-ACETAMINOPHEN 7.5-325 MG PO TABS: 1.0000 | ORAL_TABLET | ORAL | Status: DC | PRN

## 2013-12-18 NOTE — Progress Notes (Addendum)
Subjective:  This chart was scribed for [name], MD by Ladene Artist, ED Scribe. The patient was seen in room 23. Patient's care was started at 12:47 PM.   Patient ID: Douglas [name], male    DOB: 04-01-1956, 58 y.o.   MRN: [id number]  Chief Complaint  Patient presents with  . Follow-up   HPI HPI Comments: Douglas [name] is a 58 y.o. male who presents to the [facility] for a follow-up. Pt reports urinary frequency. He states that he has to use the restroom every 2 hours. Pt is scheduled to have stent removed at the end of this month.  Pt reports L shoulder pain and R middle finger pain. Pt states that he is able do 7 reps of 15 lbs now. He is considering possible injections to both areas. Pt takes 2 Hydrocodone pills daily; 1 in the morning and 1 at lunch. He states that he does not take a pill at night since he drinks 1-2 beers.  Pt states that he is doing well with chewing gum. He reports a decrease in tobacco use.  Pt plans to travel for the next 2 weeks, work 1 week and at [employer] following that. [facility] appointment is [facility]. Pt plans to file for intermittent disability with his job which covers him for 6 months. Pt suspects that he will have to have PET scans every 3 months over the past 2 years.   Past Medical History  Diagnosis Date  . Renal calculi   . Compression fracture     S/P L-SPINE; pt does not recall this hx on 10/16/2013  . GERD (gastroesophageal reflux disease)   . History of surgical fusion joint     DDD C-SPINE  . Hemoptysis   . Allergic rhinitis   . Hyperlipidemia   . Encounter for long-term (current) use of other medications   . COPD (chronic obstructive pulmonary disease)     FVC .66  . Tobacco use disorder   . Melanoma in situ   . Unspecified vitamin D deficiency   . Leiomyoma OF SKIN    INCREASED RISK RENAL CELL CA  NEEDS CT OF KIDNEYS EVERY 2 YEARS NEXT DUE  02/06/13  . Hypertension   . Neuromuscular disorder   . History of  blood transfusion 09/2013    "think so; not sure; related to big OR"  . Osteoarthritis   . Arthritis     "all over my body" (10/16/2013)  . Chronic back pain     "neck to lower back" (10/16/2013)  . Depression     "situational since 09/22/2013 OR"  . Melanoma of back   . 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 Renal cell carcinoma     type 2; papillary   Current Outpatient Prescriptions on File Prior to Visit  Medication Sig Dispense Refill  . atorvastatin (LIPITOR) 10 MG tablet Take 10 mg by mouth at bedtime.      . B Complex-C (B-COMPLEX WITH VITAMIN C) tablet Take 1 tablet by mouth daily.      . cholecalciferol (VITAMIN D) 1000 UNITS tablet Take 1,000 Units by mouth daily.      . fluticasone (FLONASE) 50 MCG/ACT nasal spray Place 1 spray into both nostrils at bedtime.      Marland Kitchen HYDROcodone-acetaminophen (NORCO) 7.5-325 MG per tablet Take 1 tablet by mouth every 4 (four) hours as needed for moderate pain.  42 tablet  0  . losartan (COZAAR) 50 MG  tablet Take 50 mg by mouth daily before breakfast.      . Multiple Vitamin (MULTIVITAMIN WITH MINERALS) TABS tablet Take 1 tablet by mouth daily.      . nicotine polacrilex (NICORETTE) 4 MG gum Take 4 mg by mouth as needed for smoking cessation.      Marland Kitchen omeprazole (PRILOSEC OTC) 20 MG tablet Take 20 mg by mouth daily.      . vitamin C (ASCORBIC ACID) 500 MG tablet Take 500 mg by mouth daily.       No current facility-administered medications on file prior to visit.   Allergies  Allergen Reactions  . Avelox [Moxifloxacin Hcl In Nacl] Hives, Shortness Of Breath and Swelling   Review of Systems  Gastrointestinal: Positive for abdominal pain.  Genitourinary: Positive for frequency.  Musculoskeletal: Positive for arthralgias.      Objective:   Physical Exam CONSTITUTIONAL: Well developed/well nourished HEAD: Normocephalic/atraumatic EYES: EOMI/PERRL ENMT: Mucous membranes moist NECK: supple no meningeal signs SPINE:entire  spine nontender CV: S1/S2 noted, no murmurs/rubs/gallops noted LUNGS: Lungs are clear to auscultation bilaterally, no apparent distress ABDOMEN: soft, tenderness in RLQ, healed scar over mid abdomen and R lower abdomen GU:no cva tenderness NEURO: Pt is awake/alert, moves all extremitiesx4 EXTREMITIES: pulses normal, full ROM SKIN: warm, color normal PSYCH: no abnormalities of mood noted    Assessment & Plan:  I spoke with Dr. Megan Salon about the situation. He is going to restart cephalexin for urinary tract infection. His latest culture growing Escherichia coli. I did refill the patient's hydrocodone. I gave him #60 tablets. He had 20 tablets left in his bottle this should last him for a full month from today. He should have a followup urine checked when off of antibiotics . He is scheduled for removal of the stent in his right kidney in approximately 3 weeks. He has triggering of the fingers of his right hand with persistent severe left shoulder pain and is going to see Dr. Mardelle Matte about this. I personally performed the services described in this documentation, which was scribed in my presence. The recorded information has been reviewed and is accurate.

## 2013-12-18 NOTE — Telephone Encounter (Signed)
Mr. Rossbach had a markedly abnormal urinalysis and his urine culture grew Escherichia coli again. It is resistant to trimethoprim sulfamethoxazole that he was on most recently but remains susceptible to cephalexin. He has not had any documented fever recently but he has had some mild chills. His urinary urgency is getting worse and he was up about every 2 hours last night to urinate. He denies dysuria but states that he has a slight burning sensation at the tip of his penis. I will treat him with cephalexin 500 mg 4 times daily for 2 weeks.  His revolving door urinary tract infections recently are likely due to the anatomical abnormalities and ureteral stent resulting from his recent complex surgery. He is scheduled to [facility] this [facility] by Dr. [name] and may have his stent removed at that time.

## 2013-12-25 ENCOUNTER — Telehealth: Payer: Self-pay | Admitting: *Deleted

## 2013-12-25 NOTE — Telephone Encounter (Signed)
Patient is about to go out of town on vacation.  He will be completing his 2 weeks of cephalexin 500mg  4 times daily next Thursday 7/16.  There is 1 refill available on this prescription.   Pt denies any symptoms currently.  Pt would like to know if he should refill the medicine and continue taking it for another 2 weeks.   Landis Gandy, RN

## 2013-12-26 NOTE — Telephone Encounter (Signed)
Patient notified

## 2013-12-26 NOTE — Telephone Encounter (Signed)
Please tell him that he does not need to refill cephalexin. I would have him stay off of antibiotics as long as he is feeling well.

## 2014-01-04 ENCOUNTER — Ambulatory Visit (INDEPENDENT_AMBULATORY_CARE_PROVIDER_SITE_OTHER): Payer: BC Managed Care – PPO | Admitting: Family Medicine

## 2014-01-04 VITALS — BP 112/74 | HR 101 | Temp 98.3°F | Resp 18 | Ht 73.0 in | Wt 183.8 lb

## 2014-01-04 DIAGNOSIS — N390 Urinary tract infection, site not specified: Secondary | ICD-10-CM

## 2014-01-04 DIAGNOSIS — R319 Hematuria, unspecified: Principal | ICD-10-CM

## 2014-01-04 DIAGNOSIS — Z85528 Personal history of other malignant neoplasm of kidney: Secondary | ICD-10-CM

## 2014-01-04 LAB — POCT UA - MICROSCOPIC ONLY
CASTS, UR, LPF, POC: NEGATIVE
CRYSTALS, UR, HPF, POC: NEGATIVE
Mucus, UA: NEGATIVE
YEAST UA: POSITIVE

## 2014-01-04 LAB — POCT URINALYSIS DIPSTICK
Bilirubin, UA: NEGATIVE
Glucose, UA: NEGATIVE
Ketones, UA: NEGATIVE
Nitrite, UA: POSITIVE
Protein, UA: 30
Spec Grav, UA: <=1.005
UROBILINOGEN UA: 0.2
pH, UA: 5.5

## 2014-01-04 MED ORDER — CEPHALEXIN 500 MG PO CAPS: 500.0000 mg | ORAL_CAPSULE | Freq: Four times a day (QID) | ORAL | Status: DC

## 2014-01-04 NOTE — Patient Instructions (Addendum)
We will restart Keflex, as most recent urine culture was sensitive to this, but will know more once we have the culture back in a few days. We will also check a fungal urine culture.  Start a probiotic - can ask pharmacist about some options.  Keep follow up with Dr. Everlene Farrier in 4 days, but if any worsening (fever, abdominal or back pain, nausea or vomiting), return sooner.   Return to the clinic or go to the nearest emergency room if any of your symptoms worsen or new symptoms occur.

## 2014-01-04 NOTE — Progress Notes (Signed)
Subjective:   This chart was scribed for [name], MD  by Forrestine Him, [facility] Scribe. This patient was seen in room 13 and the patient's care was started 9:23 AM.    Patient ID: Douglas [name], male    DOB: 14-Jul-1955, 58 y.o.   MRN: [id number]  Chief Complaint  Patient presents with  . Urinary Tract Infection    x last night   HPI  HPI Comments: Douglas [name] is a 58 y.o. male who presents to [facility] with a PMHx of renal cancer followed by [facility] with Urologist Dr. Tresa Moore locally. He has a R renal stent and has had recurrent UTIs with the stent including hospitalizations prior. He has also received consult from infectious disease-Dr. Megan Salon for recurrent UTIs. Last seen 6/30 by infectious disease. Was thought stent altered anatomy, or likely contributed to risk of infection. Most recent urine positive for E. Coli in 6/30. Sensitive to Cephalasporin. Was treated with Keflex 500 mg QID for 14 days. 2 previous urine cultures on 6/22 and 6/18 were without significant bacteria but yeast was noted on 6/18 culture. Renal function test performed 6/22 during hospital stay which was positive for candida albicans. Pt was treated with Septra during 6/20 admission. He now c/o UTI x 1 day. He has noted cloudy urine and urinary frequency. Pt states most recent previous UTI consisted of a "weird feeling when urinating" and urinary urgency. States he recently finished course of Keflex antibiotic 3 days ago from previous infection. At this time he denies any fever, chills, abdominal pain, or rash. Pt has a scheduled follow up appointment with Dr. Everlene Farrier 6/23 and plans to follow up with [facility] 7/27. At that time, his stent will be removed. No other concerns this visit.  Patient Active Problem List   Diagnosis Date Noted  . Protein-calorie malnutrition, severe 10/17/2013  . Complicated UTI (urinary tract infection) 10/16/2013  . Pyelonephritis 10/16/2013  .  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 Renal cell cancer 06/27/2012  . Renal calculi   . GERD (gastroesophageal reflux disease)   . Allergic rhinitis   . Osteoarthritis   . Encounter for long-term (current) use of other medications   . Hyperlipidemia   . Tobacco use disorder   . COPD (chronic obstructive pulmonary disease)   . Unspecified vitamin D deficiency   . Hypertension    Past Medical History  Diagnosis Date  . Renal calculi   . Compression fracture     S/P L-SPINE; pt does not recall this hx on 10/16/2013  . GERD (gastroesophageal reflux disease)   . History of surgical fusion joint     DDD C-SPINE  . Hemoptysis   . Allergic rhinitis   . Hyperlipidemia   . Encounter for long-term (current) use of other medications   . COPD (chronic obstructive pulmonary disease)     FVC .66  . Tobacco use disorder   . Melanoma in situ   . Unspecified vitamin D deficiency   . Leiomyoma OF SKIN    INCREASED RISK RENAL CELL CA  NEEDS CT OF KIDNEYS EVERY 2 YEARS NEXT DUE  02/06/13  . Hypertension   . Neuromuscular disorder   . History of blood transfusion 09/2013    "think so; not sure; related to big OR"  . Osteoarthritis   . Arthritis     "all over my body" (10/16/2013)  . Chronic back pain     "neck to  lower back" (10/16/2013)  . Depression     "situational since 09/22/2013 OR"  . Melanoma of back   . 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 Renal cell carcinoma     type 2; papillary   Past Surgical History  Procedure Laterality Date  . Anterior cruciate ligament repair Bilateral [id number]    [id number]  . Anterior cervical decomp/discectomy fusion  1992  . Hand ligament reconstruction Right   . Robotic assited partial nephrectomy  05/22/2012    Procedure: ROBOTIC ASSITED PARTIAL NEPHRECTOMY;  Surgeon: Alexis Frock, MD;  Location: [facility];  Service: Urology;  Laterality: Right;  Right Robotic Cyst Decortication and Partial  Nephrectomy   . Spine surgery    . Right colectomy Right 09/22/2013  . Cholecystectomy  09/22/2013    "had tumors in it"  . Appendectomy  09/22/2013  . Removal of fascia overlying the right psoas Right 09/22/2013  . Partial nephrectomy Right 09/22/2013  . Renal artery stent Right 09/22/2013  . Lymph node dissection Right 09/22/2013    "in the area of the kidney; they were clean"  . Harvest bone graft Right 1992    "hip; for neck fusion"  . Inguinal hernia repair Right 2004  . Ligament repair Right ~ 2001    "little finger"  . Melanoma excision  ~ 2007    "off my back"   Allergies  Allergen Reactions  . Avelox [Moxifloxacin Hcl In Nacl] Hives, Shortness Of Breath and Swelling   Prior to Admission medications   Medication Sig Start Date End Date Taking? Authorizing Provider  atorvastatin (LIPITOR) 10 MG tablet Take 10 mg by mouth at bedtime.   Yes Historical Provider, MD  B Complex-C (B-COMPLEX WITH VITAMIN C) tablet Take 1 tablet by mouth daily.   Yes Historical Provider, MD  cholecalciferol (VITAMIN D) 1000 UNITS tablet Take 1,000 Units by mouth daily.   Yes Historical Provider, MD  fluticasone (FLONASE) 50 MCG/ACT nasal spray Place 1 spray into both nostrils at bedtime.   Yes Historical Provider, MD  HYDROcodone-acetaminophen (NORCO) 7.5-325 MG per tablet Take 1 tablet by mouth every 4 (four) hours as needed for moderate pain. 12/18/13  Yes [name], MD  losartan (COZAAR) 50 MG tablet Take 50 mg by mouth daily before breakfast.   Yes Historical Provider, MD  Multiple Vitamin (MULTIVITAMIN WITH MINERALS) TABS tablet Take 1 tablet by mouth daily.   Yes Historical Provider, MD  nicotine polacrilex (NICORETTE) 4 MG gum Take 4 mg by mouth as needed for smoking cessation.   Yes Historical Provider, MD  omeprazole (PRILOSEC OTC) 20 MG tablet Take 20 mg by mouth daily.   Yes Historical Provider, MD  vitamin C (ASCORBIC ACID) 500 MG tablet Take 500 mg by mouth daily.   Yes Historical Provider, MD    History   Social History  . Marital Status: Married    Spouse Name: N/A    Number of Children: N/A  . 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 Smoking status: Current Every Day Smoker -- 0.50 packs/day for 40 years    Types: Cigarettes  . Smokeless tobacco: Never Used     Comment: Pt chewing Nicorette gum currently  . Alcohol Use: Yes     Comment: 10/16/2013 "2-3 beers daily generally; nothing last month or so"  . Drug Use: No  . Sexual Activity: Not Currently   Other Topics  Concern  . Not on file   Social History Narrative   Walks daily for exercise.     Review of Systems  Constitutional: Negative for fever and chills.  Gastrointestinal: Negative for abdominal pain.  Genitourinary: Positive for frequency.  Skin: Negative for rash.  Psychiatric/Behavioral: Negative for confusion.     Objective:  Physical Exam  Nursing note and vitals reviewed. Constitutional: He is oriented to person, place, and time. He appears well-developed and well-nourished.  HENT:  Head: Normocephalic.  Eyes: EOM are normal.  Neck: Normal range of motion.  Pulmonary/Chest: Effort normal.  Abdominal: Soft. He exhibits no distension. There is no tenderness.  Genitourinary:  No CVA tenderness  Musculoskeletal: Normal range of motion.  Neurological: He is alert and oriented to person, place, and time.  Psychiatric: He has a normal mood and affect.    Filed Vitals:   01/04/14 0850  BP: 112/74  Pulse: 101  Temp: 98.3 F (36.8 C)  TempSrc: Oral  Resp: 18  Height: 6\' 1"  (1.854 m)  Weight: 183 lb 12.8 oz (83.371 kg)  SpO2: 96%     Assessment & Plan:   Douglas [name] is a 58 y.o. male Urinary tract infection with hematuria, site unspecified - Plan: POCT urinalysis dipstick, POCT UA - Microscopic Only, Urine culture, cephALEXin (KEFLEX) 500 MG capsule, Culture, fungus without smear  Personal history of renal  cancer - Plan: cephALEXin (KEFLEX) 500 MG capsule, Culture, fungus without smear Uti, recurrent, with underlying renal cancer and stent in place. Most liley caused of recurrent UTIs. He has been followed by ID and complicated history as above. He has also had [place] on multiple urine tests but has not needed treatment for this prior. Most recent urine culture resistant to Sepra so will restart Keflex with close eye on culture to change as needed as infection started just at the end of the course of Keflex most recently. Discussed checking both urine fungal and bacterial cultures. RTC/ER precautions. Also advised to start probiotic.   Meds ordered this encounter  Medications  . cephALEXin (KEFLEX) 500 MG capsule    Sig: Take 1 capsule (500 mg total) by mouth 4 (four) times daily.    Dispense:  40 capsule    Refill:  0   Patient Instructions  We will restart Keflex, as most recent urine culture was sensitive to this, but will know more once we have the culture back in a few days. We will also check a fungal urine culture.  Start a probiotic - can ask pharmacist about some options.  Keep follow up with Dr. Everlene Farrier in 4 days, but if any worsening (fever, abdominal or back pain, nausea or vomiting), return sooner.   Return to the clinic or go to the nearest emergency room if any of your symptoms worsen or new symptoms occur.    I personally performed the services described in this documentation, which was scribed in my presence. The recorded information has been reviewed and is accurate.

## 2014-01-07 LAB — URINE CULTURE

## 2014-01-08 ENCOUNTER — Encounter: Payer: Self-pay | Admitting: Emergency Medicine

## 2014-01-08 ENCOUNTER — Ambulatory Visit (INDEPENDENT_AMBULATORY_CARE_PROVIDER_SITE_OTHER): Payer: BC Managed Care – PPO | Admitting: Emergency Medicine

## 2014-01-08 VITALS — BP 127/84 | HR 75 | Temp 98.7°F | Resp 16 | Ht 73.0 in | Wt 186.4 lb

## 2014-01-08 DIAGNOSIS — F4311 Post-traumatic stress disorder, acute: Secondary | ICD-10-CM

## 2014-01-08 DIAGNOSIS — C649 Malignant neoplasm of unspecified kidney, except renal pelvis: Secondary | ICD-10-CM

## 2014-01-08 DIAGNOSIS — R319 Hematuria, unspecified: Principal | ICD-10-CM

## 2014-01-08 DIAGNOSIS — Z85528 Personal history of other malignant neoplasm of kidney: Secondary | ICD-10-CM

## 2014-01-08 DIAGNOSIS — C641 Malignant neoplasm of right kidney, except renal pelvis: Secondary | ICD-10-CM

## 2014-01-08 DIAGNOSIS — F4310 Post-traumatic stress disorder, unspecified: Secondary | ICD-10-CM

## 2014-01-08 DIAGNOSIS — N390 Urinary tract infection, site not specified: Secondary | ICD-10-CM

## 2014-01-08 DIAGNOSIS — D481 Neoplasm of uncertain behavior of connective and other soft tissue: Secondary | ICD-10-CM

## 2014-01-08 MED ORDER — HYDROCODONE-ACETAMINOPHEN 7.5-325 MG PO TABS: 1.0000 | ORAL_TABLET | ORAL | Status: DC | PRN

## 2014-01-08 NOTE — Progress Notes (Signed)
Subjective:    Patient ID: Douglas [name], male    DOB: 1955-08-02, 58 y.o.   MRN: [id number]  This chart was scribed for [name], MD by Cathie Hoops, ED Scribe. The patient was seen in Room 21. The patient's care was started at 8:04 AM.   Chief Complaint  Patient presents with  . Follow-up    folllow up on infections since surgery   Past Medical History  Diagnosis Date  . Renal calculi   . Compression fracture     S/P L-SPINE; pt does not recall this hx on 10/16/2013  . GERD (gastroesophageal reflux disease)   . History of surgical fusion joint     DDD C-SPINE  . Hemoptysis   . Allergic rhinitis   . Hyperlipidemia   . Encounter for long-term (current) use of other medications   . COPD (chronic obstructive pulmonary disease)     FVC .66  . Tobacco use disorder   . Melanoma in situ   . Unspecified vitamin D deficiency   . Leiomyoma OF SKIN    INCREASED RISK RENAL CELL CA  NEEDS CT OF KIDNEYS EVERY 2 YEARS NEXT DUE  02/06/13  . Hypertension   . Neuromuscular disorder   . History of blood transfusion 09/2013    "think so; not sure; related to big OR"  . Osteoarthritis   . Arthritis     "all over my body" (10/16/2013)  . Chronic back pain     "neck to lower back" (10/16/2013)  . Depression     "situational since 09/22/2013 OR"  . Melanoma of back   . 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 Renal cell carcinoma     type 2; papillary   HPI HPI Comments: Douglas [name] is a 58 y.o. male with past medical history of Hereditary leiomyomatosis and renal cell cancer who presents to the [facility] complaining of new, gradually worsening, moderate fatigue. Pt states he got very upset with his wife 01/07/14 because she refuses to wear her heart rate monitor for her heart condition.  Pt states he saw his therapist, Dr. Wilfred Curtis, yesterday, and everything is normal. Pt reports needing a prescription refills on his Norco for his shoulder  and abdomen.  Pt has metastatic renal cancer. Pt got surgery at [place]. Pt will leave to go to [facility] on [facility]. Pt states he will have his stent checked on 01/12/14. Pt reports the nurse at [place] states he will have to go for a follow-up PET/CT scans at [place] every 3-4 months. Pt reports he is having some tingling in the LLQ of his abdomen.  Pt reports his wife states she will attempt to wear her heart rate monitor more often. Pt also reports his wife is not doing well psychologically with her condition.   Pt states he wants to discuss his HLRCC and subsequent infections with Dr. Viona Gilmore. [name] when he visits the [facility].   Review of Systems  Constitutional: Negative for fever and chills.  Respiratory: Positive for cough (mild). Negative for shortness of breath.   Gastrointestinal: Negative for nausea and vomiting.  Neurological: Negative for weakness.     Triage Vitals: BP 127/84  Pulse 75  Temp(Src) 98.7 F (37.1 C) (Oral)  Resp 16  Ht 6\' 1"  (1.854 m)  Wt 186 lb 6.4 oz (84.55 kg)  BMI 24.60 kg/m2  SpO2 97%  Objective:   Physical Exam  Abdominal:  Large surgical scar  on the right side of his abdomen. There is tenderness and some apparent swelling in the right lower abdomen. No rebound. No definite mass.   CONSTITUTIONAL: Well developed/well nourished HEAD: Normocephalic/atraumatic EYES: EOMI/PERRL ENMT: Mucous membranes moist NECK: supple no meningeal signs SPINE:entire spine nontender CV: S1/S2 noted, no murmurs/rubs/gallops noted LUNGS: Lungs are clear to auscultation bilaterally, no apparent distress ABDOMEN: soft with no guarding. Large surgical scar on the right side of his abdomen. There is tenderness and some apparent swelling in the right lower abdomen. No rebound. No definite mass. GU:no cva tenderness NEURO: Pt is awake/alert, moves all extremitiesx4 EXTREMITIES: pulses normal, full ROM SKIN: warm, color normal PSYCH: no abnormalities of mood noted     Assessment &  Plan:  .DIAGNOSTIC STUDIES: Oxygen Saturation is 97% on RA, adequate by my interpretation.    COORDINATION OF CARE: 8:23 AM- Patient informed of current plan for treatment and evaluation and agrees with plan at this time.  Patient leaving for the [facility] on this Saturday. He will have followup scans done at that time. His current cultures growing Escherichia coli and it is sensitive to Keflex . His wife is also experiencing significant medical problems at the present time. This along with the ongoing treatment of his cancer has been very stressful. He does have a therapist who helps him. I will see him back on his return from [place] and their evaluation. I did advise him to try a six-inch Ace wrap or a binder on his lower abdomen to see if this would give him some support and help with his lower abdominal discomfort

## 2014-01-09 LAB — CULTURE, FUNGUS WITHOUT SMEAR

## 2014-01-12 ENCOUNTER — Telehealth: Payer: Self-pay | Admitting: Emergency Medicine

## 2014-01-12 NOTE — Telephone Encounter (Signed)
I received a phone call from Mr. sink 1. Patient states his wife does not want to go with him to the [facility]. I advised him to call the patient's father and give him an update on the situation. I called and spoke with Mrs. Almyra Deforest. She is agreeable to go to [place] long for an evaluation.

## 2014-01-18 ENCOUNTER — Ambulatory Visit (INDEPENDENT_AMBULATORY_CARE_PROVIDER_SITE_OTHER): Payer: BC Managed Care – PPO | Admitting: Family Medicine

## 2014-01-18 VITALS — BP 110/82 | HR 73 | Temp 98.0°F | Resp 16 | Ht 72.25 in | Wt 183.4 lb

## 2014-01-18 DIAGNOSIS — Z85528 Personal history of other malignant neoplasm of kidney: Secondary | ICD-10-CM

## 2014-01-18 DIAGNOSIS — R300 Dysuria: Secondary | ICD-10-CM

## 2014-01-18 DIAGNOSIS — N390 Urinary tract infection, site not specified: Secondary | ICD-10-CM

## 2014-01-18 DIAGNOSIS — R319 Hematuria, unspecified: Secondary | ICD-10-CM

## 2014-01-18 LAB — POCT URINALYSIS DIPSTICK
Bilirubin, UA: NEGATIVE
Glucose, UA: NEGATIVE
KETONES UA: NEGATIVE
Nitrite, UA: POSITIVE
Protein, UA: 30
UROBILINOGEN UA: 0.2
pH, UA: 5

## 2014-01-18 LAB — POCT UA - MICROSCOPIC ONLY
CASTS, UR, LPF, POC: NEGATIVE
Crystals, Ur, HPF, POC: NEGATIVE
EPITHELIAL CELLS, URINE PER MICROSCOPY: NEGATIVE

## 2014-01-18 MED ORDER — CEPHALEXIN 500 MG PO CAPS: 500.0000 mg | ORAL_CAPSULE | Freq: Four times a day (QID) | ORAL | Status: DC

## 2014-01-18 NOTE — Progress Notes (Addendum)
This chart was scribed for Douglas Ray, MD by Douglas [name], ED Scribe. This patient was seen in room 3 and the patient's care was started at 11:09 AM.  Authored by Douglas Forehand, MD - unable to change in [facility].  Subjective:    Patient ID: Douglas [name], male    DOB: 04/11/1956, 58 y.o.   MRN: [id number]  Urinary Tract Infection  Associated symptoms include frequency. Pertinent negatives include no chills, nausea or vomiting.   Chief Complaint  Patient presents with   Urinary Tract Infection    finished keflex on wednesday and started back the UTI symptoms.     HPI Comments: Douglas [name] is a 58 y.o. male who presents to the [facility] complaining of UTI. Pt has complicated med history including renal cancer followed by [facility]. Right renal stent with recurrent UTI along with yeast. See prior visits. He had infectious disease consults and was thought that the altered anatomy with the stent has contributed to recurrent infections. Last treated by me July 19th with 10 days of keflex with a urine culture posiive with ecoli with sensitivity. Additionally he had fungal infection of the urine that was positive with candida which has been present before but has not required treatment. Initially planned on visit [facility] last week with possible removal of stent but due to his wife's condition he was unable to make that trip. Pt reports stent removal in about 3 weeks.  Today pt reports 1 day ago while fishing. He reports he finished 10 day coarse of keflex 4 days ago. He reports he started feeling better within 3 days of the treatment. He reports an increase frequency with a decreased amount, cloudy, dark colored urine yesterday. Pt reports an increase of water intake and drinking a jug of cranberry juice 1 day ago with mild relief. At this time pt reports feeling better than yesterday. Pt denies feeling cold.   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) 08/20/2012   Renal cell cancer 06/27/2012   Renal calculi    GERD (gastroesophageal reflux disease)    Allergic rhinitis    Osteoarthritis    Encounter for long-term (current) use of other medications    Hyperlipidemia    Tobacco use disorder    COPD (chronic obstructive pulmonary disease)    Unspecified vitamin D deficiency    Hypertension    Past Medical History  Diagnosis Date   Renal calculi    Compression fracture     S/P L-SPINE; pt does not recall this hx on 10/16/2013   GERD (gastroesophageal reflux disease)    History of surgical fusion joint     DDD C-SPINE   Hemoptysis    Allergic rhinitis    Hyperlipidemia    Encounter for long-term (current) use of other medications    COPD (chronic obstructive pulmonary disease)     FVC .77   Tobacco use disorder    Melanoma in situ    Unspecified vitamin D deficiency    Leiomyoma OF SKIN    INCREASED RISK RENAL CELL CA  NEEDS CT OF KIDNEYS EVERY 2 YEARS NEXT DUE  02/06/13   Hypertension    Neuromuscular disorder    History of blood transfusion 09/2013    "think so; not sure; related to big OR"   Osteoarthritis  Arthritis     "all over my body" (10/16/2013)   Chronic back pain     "neck to lower back" (10/16/2013)   Depression     "situational since 09/22/2013 OR"   Melanoma of back    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)    Renal cell carcinoma     type 2; papillary   Past Surgical History  Procedure Laterality Date   Anterior cruciate ligament repair Bilateral [phone]   Anterior cervical decomp/discectomy fusion  1992   Hand ligament reconstruction Right    Robotic assited partial nephrectomy  05/22/2012    Procedure: ROBOTIC ASSITED PARTIAL NEPHRECTOMY;  Surgeon:  Alexis Frock, MD;  Location: [facility];  Service: Urology;  Laterality: Right;  Right Robotic Cyst Decortication and Partial Nephrectomy    Spine surgery     Right colectomy Right 09/22/2013   Cholecystectomy  09/22/2013    "had tumors in it"   Appendectomy  09/22/2013   Removal of fascia overlying the right psoas Right 09/22/2013   Partial nephrectomy Right 09/22/2013   Renal artery stent Right 09/22/2013   Lymph node dissection Right 09/22/2013    "in the area of the kidney; they were clean"   Harvest bone graft Right 1992    "hip; for neck fusion"   Inguinal hernia repair Right 2004   Ligament repair Right ~ 2001    "little finger"   Melanoma excision  ~ 2007    "off my back"   Allergies  Allergen Reactions   Avelox [Moxifloxacin Hcl In Nacl] Hives, Shortness Of Breath and Swelling   Prior to Admission medications   Medication Sig Start Date End Date Taking? Authorizing Provider  atorvastatin (LIPITOR) 10 MG tablet Take 10 mg by mouth at bedtime.   Yes Historical Provider, MD  B Complex-C (B-COMPLEX WITH VITAMIN C) tablet Take 1 tablet by mouth daily.   Yes Historical Provider, MD  cholecalciferol (VITAMIN D) 1000 UNITS tablet Take 1,000 Units by mouth daily.   Yes Historical Provider, MD  fluticasone (FLONASE) 50 MCG/ACT nasal spray Place 1 spray into both nostrils at bedtime.   Yes Historical Provider, MD  HYDROcodone-acetaminophen (NORCO) 7.5-325 MG per tablet Take 1 tablet by mouth every 4 (four) hours as needed for moderate pain. 01/08/14  Yes [name], MD  losartan (COZAAR) 50 MG tablet Take 50 mg by mouth daily before breakfast.   Yes Historical Provider, MD  Multiple Vitamin (MULTIVITAMIN WITH MINERALS) TABS tablet Take 1 tablet by mouth daily.   Yes Historical Provider, MD  nicotine polacrilex (NICORETTE) 4 MG gum Take 4 mg by mouth as needed for smoking cessation.   Yes Historical Provider, MD  omeprazole (PRILOSEC OTC) 20 MG tablet Take 20 mg by mouth daily.   Yes  Historical Provider, MD  vitamin C (ASCORBIC ACID) 500 MG tablet Take 500 mg by mouth daily.   Yes Historical Provider, MD   History   Social History   Marital Status: Married    Spouse Name: N/A    Number of Children: N/A   Years of Education: N/A   Occupational History   Games developer   Social History Main Topics   Smoking status: Current Every Day Smoker -- 0.50 packs/day for 40 years    Types: Cigarettes   Smokeless tobacco: Never Used     Comment: Pt chewing Nicorette gum currently   Alcohol Use: Yes     Comment: 10/16/2013 "2-3 beers daily  generally; nothing last month or so"   Drug Use: No   Sexual Activity: Not Currently   Other Topics Concern   Not on file   Social History Narrative   Walks daily for exercise.   Review of Systems  Constitutional: Negative for fever and chills.  HENT: Negative for congestion and rhinorrhea.   Respiratory: Negative for cough.   Gastrointestinal: Negative for nausea, vomiting and diarrhea.  Genitourinary: Positive for frequency and decreased urine volume.       Objective:   Physical Exam  Nursing note and vitals reviewed. Constitutional: He is oriented to person, place, and time. He appears well-developed and well-nourished. No distress.  HENT:  Head: Normocephalic and atraumatic.  Eyes: Conjunctivae and EOM are normal.  Neck: Neck supple.  Cardiovascular: Normal rate.   Pulmonary/Chest: Effort normal.  Abdominal: Soft. He exhibits no distension ( RLQ). There is tenderness ( minimal) in the right lower quadrant. There is no CVA tenderness.  Well healed scar on right abdomen.  Musculoskeletal: Normal range of motion.  Neurological: He is alert and oriented to person, place, and time.  Skin: Skin is warm and dry.  Psychiatric: He has a normal mood and affect. His behavior is normal.   Filed Vitals:   01/18/14 0942  BP: 110/82  Pulse: 73  Temp: 98 F (36.7 C)  TempSrc: Oral  Resp: 16  Height: 6'  0.25" (1.835 m)  Weight: 183 lb 6.4 oz (83.19 kg)  SpO2: 98%   Results for orders placed in visit on 01/18/14  POCT UA - MICROSCOPIC ONLY      Result Value Ref Range   WBC, Ur, HPF, POC tntc     RBC, urine, microscopic tntc     Bacteria, U Microscopic 2+     Mucus, UA small     Epithelial cells, urine per micros neg     Crystals, Ur, HPF, POC neg     Casts, Ur, LPF, POC neg     Yeast, UA buds    POCT URINALYSIS DIPSTICK      Result Value Ref Range   Color, UA yellow     Clarity, UA cloudy     Glucose, UA neg     Bilirubin, UA neg     Ketones, UA neg     Spec Grav, UA <=1.005     Blood, UA mod     pH, UA 5.0     Protein, UA 30     Urobilinogen, UA 0.2     Nitrite, UA pos     Leukocytes, UA large (3+)      Assessment & Plan:   Douglas [name] is a 58 y.o. male Dysuria - Plan: POCT UA - Microscopic Only, POCT urinalysis dipstick, Urine culture  History of renal cell cancer - Plan: Urine culture  Recurrent UTI - Plan: Urine culture  Urinary tract infection with hematuria, site unspecified - Plan: cephALEXin (KEFLEX) 500 MG capsule  Personal history of renal cancer - Plan: cephALEXin (KEFLEX) 500 MG capsule  Complicated, recurrent UTI with hx of renal CA, and stent in place.  Quick improvement with most recent UTI on Keflex, and although candida isolated prior, with quick improvement on KEflex, may be more incidental and not primary cause of infection.  Deferred treatment for yeast at present, restart Kefle 500mg  QID, urine cx and RTC for recheck at scheduled appt with Dr. Everlene Farrier in 2 days.  As he is not scheduled to be seen at [place] for a few  weeks, with anticipated removal of stent then, may need to be on prophylactic abx after current treatment vs. Extended course of Keflex beyond 10 days. This and candida presence on prior cx (and buds on urinalysis today) can be discussed further with Dr. Everlene Farrier at follow up. Push fluids, RTC precautions discussed. recheck 2 days.   Meds  ordered this encounter  Medications   cephALEXin (KEFLEX) 500 MG capsule    Sig: Take 1 capsule (500 mg total) by mouth 4 (four) times daily.    Dispense:  40 capsule    Refill:  0   Patient Instructions  Restart Keflex, but we will check another urine culture to make sure this is the appropriate antibiotic for this infection. Keep follow up with Dr. Everlene Farrier in 2 days to decide on duration of antibiotic treatment, and to discuss if antibiotic needed for prevention until visit to [facility].  Candida (yeast) did grow on prior urine test, but may not necessarily need to be treated at this point - can also discuss this further with Dr. Everlene Farrier. Return to the clinic or go to the nearest emergency room if any of your symptoms worsen or new symptoms occur. Urinary Tract Infection Urinary tract infections (UTIs) can develop anywhere along your urinary tract. Your urinary tract is your body's drainage system for removing wastes and extra water. Your urinary tract includes two kidneys, two ureters, a bladder, and a urethra. Your kidneys are a pair of bean-shaped organs. Each kidney is about the size of your fist. They are located below your ribs, one on each side of your spine. CAUSES Infections are caused by microbes, which are microscopic organisms, including fungi, viruses, and bacteria. These organisms are so small that they can only be seen through a microscope. Bacteria are the microbes that most commonly cause UTIs. SYMPTOMS  Symptoms of UTIs may vary by age and gender of the patient and by the location of the infection. Symptoms in young women typically include a frequent and intense urge to urinate and a painful, burning feeling in the bladder or urethra during urination. Older women and men are more likely to be tired, shaky, and weak and have muscle aches and abdominal pain. A fever may mean the infection is in your kidneys. Other symptoms of a kidney infection include pain in your back or sides below the ribs,  nausea, and vomiting. DIAGNOSIS To diagnose a UTI, your caregiver will ask you about your symptoms. Your caregiver also will ask to provide a urine sample. The urine sample will be tested for bacteria and white blood cells. White blood cells are made by your body to help fight infection. TREATMENT  Typically, UTIs can be treated with medication. Because most UTIs are caused by a bacterial infection, they usually can be treated with the use of antibiotics. The choice of antibiotic and length of treatment depend on your symptoms and the type of bacteria causing your infection. HOME CARE INSTRUCTIONS  If you were prescribed antibiotics, take them exactly as your caregiver instructs you. Finish the medication even if you feel better after you have only taken some of the medication.  Drink enough water and fluids to keep your urine clear or pale yellow.  Avoid caffeine, tea, and carbonated beverages. They tend to irritate your bladder.  Empty your bladder often. Avoid holding urine for long periods of time.  Empty your bladder before and after sexual intercourse.  After a bowel movement, women should cleanse from front to back. Use each  tissue only once. SEEK MEDICAL CARE IF:   You have back pain.  You develop a fever.  Your symptoms do not begin to resolve within 3 days. SEEK IMMEDIATE MEDICAL CARE IF:   You have severe back pain or lower abdominal pain.  You develop chills.  You have nausea or vomiting.  You have continued burning or discomfort with urination. MAKE SURE YOU:   Understand these instructions.  Will watch your condition.  Will get help right away if you are not doing well or get worse. Document Released: 03/15/2005 Document Revised: 12/05/2011 Document Reviewed: 07/14/2011 [facility] Patient Information 2015 [place], [place]. This information is not intended to replace advice given to you by your health care provider. Make sure you discuss any questions you have with  your health care provider.      .I personally performed the services described in this documentation, which was scribed in my presence. The recorded information has been reviewed and considered, and addended by me as needed.

## 2014-01-18 NOTE — Patient Instructions (Signed)
Restart Keflex, but we will check another urine culture to make sure this is the appropriate antibiotic for this infection. Keep follow up with Dr. Everlene Farrier in 2 days to decide on duration of antibiotic treatment, and to discuss if antibiotic needed for prevention until visit to [facility].  Candida (yeast) did grow on prior urine test, but may not necessarily need to be treated at this point - can also discuss this further with Dr. Everlene Farrier. Return to the clinic or go to the nearest emergency room if any of your symptoms worsen or new symptoms occur. Urinary Tract Infection Urinary tract infections (UTIs) can develop anywhere along your urinary tract. Your urinary tract is your body's drainage system for removing wastes and extra water. Your urinary tract includes two kidneys, two ureters, a bladder, and a urethra. Your kidneys are a pair of bean-shaped organs. Each kidney is about the size of your fist. They are located below your ribs, one on each side of your spine. CAUSES Infections are caused by microbes, which are microscopic organisms, including fungi, viruses, and bacteria. These organisms are so small that they can only be seen through a microscope. Bacteria are the microbes that most commonly cause UTIs. SYMPTOMS  Symptoms of UTIs may vary by age and gender of the patient and by the location of the infection. Symptoms in young women typically include a frequent and intense urge to urinate and a painful, burning feeling in the bladder or urethra during urination. Older women and men are more likely to be tired, shaky, and weak and have muscle aches and abdominal pain. A fever may mean the infection is in your kidneys. Other symptoms of a kidney infection include pain in your back or sides below the ribs, nausea, and vomiting. DIAGNOSIS To diagnose a UTI, your caregiver will ask you about your symptoms. Your caregiver also will ask to provide a urine sample. The urine sample will be tested for bacteria and white  blood cells. White blood cells are made by your body to help fight infection. TREATMENT  Typically, UTIs can be treated with medication. Because most UTIs are caused by a bacterial infection, they usually can be treated with the use of antibiotics. The choice of antibiotic and length of treatment depend on your symptoms and the type of bacteria causing your infection. HOME CARE INSTRUCTIONS  If you were prescribed antibiotics, take them exactly as your caregiver instructs you. Finish the medication even if you feel better after you have only taken some of the medication.  Drink enough water and fluids to keep your urine clear or pale yellow.  Avoid caffeine, tea, and carbonated beverages. They tend to irritate your bladder.  Empty your bladder often. Avoid holding urine for long periods of time.  Empty your bladder before and after sexual intercourse.  After a bowel movement, women should cleanse from front to back. Use each tissue only once. SEEK MEDICAL CARE IF:   You have back pain.  You develop a fever.  Your symptoms do not begin to resolve within 3 days. SEEK IMMEDIATE MEDICAL CARE IF:   You have severe back pain or lower abdominal pain.  You develop chills.  You have nausea or vomiting.  You have continued burning or discomfort with urination. MAKE SURE YOU:   Understand these instructions.  Will watch your condition.  Will get help right away if you are not doing well or get worse. Document Released: 03/15/2005 Document Revised: 12/05/2011 Document Reviewed: 07/14/2011 [employer] Patient Information 2015  [employer]. This information is not intended to replace advice given to you by your health care provider. Make sure you discuss any questions you have with your health care provider.

## 2014-01-20 ENCOUNTER — Ambulatory Visit (INDEPENDENT_AMBULATORY_CARE_PROVIDER_SITE_OTHER): Payer: BC Managed Care – PPO | Admitting: Emergency Medicine

## 2014-01-20 ENCOUNTER — Encounter: Payer: Self-pay | Admitting: Emergency Medicine

## 2014-01-20 VITALS — BP 119/82 | HR 75 | Temp 98.7°F | Resp 16 | Ht 72.0 in | Wt 183.6 lb

## 2014-01-20 DIAGNOSIS — N390 Urinary tract infection, site not specified: Secondary | ICD-10-CM

## 2014-01-20 DIAGNOSIS — N200 Calculus of kidney: Secondary | ICD-10-CM

## 2014-01-20 LAB — URINE CULTURE: Colony Count: >=100000

## 2014-01-20 LAB — POCT UA - MICROSCOPIC ONLY
CRYSTALS, UR, HPF, POC: NEGATIVE
Casts, Ur, LPF, POC: NEGATIVE
EPITHELIAL CELLS, URINE PER MICROSCOPY: NEGATIVE
MUCUS UA: NEGATIVE
Yeast, UA: NEGATIVE

## 2014-01-20 LAB — POCT URINALYSIS DIPSTICK
Bilirubin, UA: NEGATIVE
Glucose, UA: NEGATIVE
Ketones, UA: NEGATIVE
Nitrite, UA: NEGATIVE
PH UA: 5.5
PROTEIN UA: 30
Spec Grav, UA: 1.02
Urobilinogen, UA: 0.2

## 2014-01-20 NOTE — Progress Notes (Signed)
Subjective:  This chart was scribed for [name], MD by Donato Schultz, Medical Scribe. This patient was seen in Room 22 and the patient's care was started at 3:31 PM.   Patient ID: Douglas [name], male    DOB: 01-28-1956, 58 y.o.   MRN: [id number]  HPI HPI Comments: Douglas [name] is a 58 y.o. male who presents to the [facility] for a follow-up appointment.    He is complaining of urinary frequency with associated hesitancy that started on Saturday.  He stopped taking Keflex 2-3 days ago but had been taking it 4 times a day.  He states that he was unable to sleep two nights ago.  [facility].  He has an appointment with Dr. Tresa Moore in September.  He does not need a refill of his medications.  He is also complaining of itchy bumps located on his abdomen.    He states that his wife has been doing well this week.  He appetite is still not back to baseline but she is eating.  Her English has been improving and she started taking Zoloft again.  He states that his wife will get very fatigued and her headaches will return.  She did not give any indication as to what started her symptoms.  She is trying to get an appointment with Dr. Caprice Beaver.    Past Medical History  Diagnosis Date  . Renal calculi   . Compression fracture     S/P L-SPINE; pt does not recall this hx on 10/16/2013  . GERD (gastroesophageal reflux disease)   . History of surgical fusion joint     DDD C-SPINE  . Hemoptysis   . Allergic rhinitis   . Hyperlipidemia   . Encounter for long-term (current) use of other medications   . COPD (chronic obstructive pulmonary disease)     FVC .66  . Tobacco use disorder   . Melanoma in situ   . Unspecified vitamin D deficiency   . Leiomyoma OF SKIN    INCREASED RISK RENAL CELL CA  NEEDS CT OF KIDNEYS EVERY 2 YEARS NEXT DUE  02/06/13  . Hypertension   . Neuromuscular disorder   . History of blood transfusion 09/2013    "think  so; not sure; related to big OR"  . Osteoarthritis   . Arthritis     "all over my body" (10/16/2013)  . Chronic back pain     "neck to lower back" (10/16/2013)  . Depression     "situational since 09/22/2013 OR"  . Melanoma of back   . 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 Renal cell carcinoma     type 2; papillary   Past Surgical History  Procedure Laterality Date  . Anterior cruciate ligament repair Bilateral [id number]    [phone]  . Anterior cervical decomp/discectomy fusion  1992  . Hand ligament reconstruction Right   . Robotic assited partial nephrectomy  05/22/2012    Procedure: ROBOTIC ASSITED PARTIAL NEPHRECTOMY;  Surgeon: Alexis Frock, MD;  Location: [facility];  Service: Urology;  Laterality: Right;  Right Robotic Cyst Decortication and Partial Nephrectomy   . Spine surgery    . Right colectomy Right 09/22/2013  . Cholecystectomy  09/22/2013    "had tumors in it"  . Appendectomy  09/22/2013  . Removal of fascia overlying the right psoas Right 09/22/2013  . Partial nephrectomy Right 09/22/2013  . Renal artery stent Right 09/22/2013  .  Lymph node dissection Right 09/22/2013    "in the area of the kidney; they were clean"  . Harvest bone graft Right 1992    "hip; for neck fusion"  . Inguinal hernia repair Right 2004  . Ligament repair Right ~ 2001    "little finger"  . Melanoma excision  ~ 2007    "off my back"   Family History  Problem Relation Age of Onset  . Adopted: Yes  . Hypertension Father    History   Social History  . Marital Status: Married    Spouse Name: N/A    Number of Children: N/A  . 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 Smoking status: Current Every Day Smoker -- 0.50 packs/day for 40 years    Types: Cigarettes  . Smokeless tobacco: Never Used     Comment: Pt chewing Nicorette gum currently  . Alcohol Use: Yes     Comment: 10/16/2013 "2-3 beers daily  generally; nothing last month or so"  . Drug Use: No  . Sexual Activity: Not Currently   Other Topics Concern  . Not on file   Social History Narrative   Walks daily for exercise.   Allergies  Allergen Reactions  . Avelox [Moxifloxacin Hcl In Nacl] Hives, Shortness Of Breath and Swelling    Review of Systems  Genitourinary: Positive for frequency.  Psychiatric/Behavioral: Positive for sleep disturbance.     Objective:  Physical Exam  Nursing note and vitals reviewed. Constitutional: He is oriented to person, place, and time. He appears well-developed and well-nourished.  HENT:  Head: Normocephalic and atraumatic.  Right Ear: Hearing, tympanic membrane, external ear and ear canal normal.  Left Ear: Hearing, tympanic membrane, external ear and ear canal normal.  Nose: Nose normal.  Mouth/Throat: Oropharynx is clear and moist. No oropharyngeal exudate.  Eyes: Conjunctivae and EOM are normal. Pupils are equal, round, and reactive to light.  Neck: Normal range of motion. Neck supple. No thyromegaly present.  Cardiovascular: Normal rate, regular rhythm and normal heart sounds.  Exam reveals no gallop and no friction rub.   No murmur heard. Pulmonary/Chest: Effort normal and breath sounds normal. No respiratory distress. He has no wheezes. He has no rales.  Abdominal: Soft. There is no tenderness.  There is a large scar over the abdomen. There is a fullness in the right lower abdomen with mild tenderness. There is no rebound noted  Musculoskeletal: Normal range of motion.  Neurological: He is alert and oriented to person, place, and time.  Skin: Skin is warm and dry.  Psychiatric: He has a normal mood and affect. His behavior is normal.   Results for orders placed in visit on 01/20/14  POCT UA - MICROSCOPIC ONLY      Result Value Ref Range   WBC, Ur, HPF, POC tntc     RBC, urine, microscopic tntc     Bacteria, U Microscopic trace     Mucus, UA neg     Epithelial cells,  urine per micros neg     Crystals, Ur, HPF, POC neg     Casts, Ur, LPF, POC neg     Yeast, UA neg    POCT URINALYSIS DIPSTICK      Result Value Ref Range   Color, UA yellow     Clarity, UA clear     Glucose, UA neg     Bilirubin, UA neg  Ketones, UA neg     Spec Grav, UA 1.020     Blood, UA small     pH, UA 5.5     Protein, UA 30     Urobilinogen, UA 0.2     Nitrite, UA neg     Leukocytes, UA small (1+)      BP 119/82  Pulse 75  Temp(Src) 98.7 F (37.1 C) (Oral)  Resp 16  Ht 6' (1.829 m)  Wt 183 lb 9.6 oz (83.28 kg)  BMI 24.90 kg/m2  SpO2 97% Assessment & Plan:  Last urine culture grew Escherichia coli sensitive to Keflex. He is currently on Keflex 504 times a day and will need to stay on this until he is able [facility] which is in his right kidney which has been the nidus for his recurrent infections .  I personally performed the services described in this documentation, which was scribed in my presence. The recorded information has been reviewed and is accurate.

## 2014-01-27 ENCOUNTER — Other Ambulatory Visit: Payer: Self-pay | Admitting: Radiology

## 2014-01-27 DIAGNOSIS — N390 Urinary tract infection, site not specified: Secondary | ICD-10-CM

## 2014-01-27 DIAGNOSIS — R319 Hematuria, unspecified: Principal | ICD-10-CM

## 2014-01-27 DIAGNOSIS — Z85528 Personal history of other malignant neoplasm of kidney: Secondary | ICD-10-CM

## 2014-01-27 MED ORDER — CEPHALEXIN 500 MG PO CAPS: 500.0000 mg | ORAL_CAPSULE | Freq: Four times a day (QID) | ORAL | Status: DC

## 2014-01-27 MED ORDER — HYDROCODONE-ACETAMINOPHEN 7.5-325 MG PO TABS: 1.0000 | ORAL_TABLET | ORAL | Status: DC | PRN

## 2014-02-04 ENCOUNTER — Other Ambulatory Visit: Payer: Self-pay | Admitting: Emergency Medicine

## 2014-02-04 DIAGNOSIS — Z85528 Personal history of other malignant neoplasm of kidney: Secondary | ICD-10-CM

## 2014-02-04 DIAGNOSIS — N390 Urinary tract infection, site not specified: Secondary | ICD-10-CM

## 2014-02-04 DIAGNOSIS — R319 Hematuria, unspecified: Principal | ICD-10-CM

## 2014-02-04 MED ORDER — CEPHALEXIN 500 MG PO CAPS: 500.0000 mg | ORAL_CAPSULE | Freq: Four times a day (QID) | ORAL | Status: DC

## 2014-02-18 ENCOUNTER — Ambulatory Visit (INDEPENDENT_AMBULATORY_CARE_PROVIDER_SITE_OTHER): Payer: BC Managed Care – PPO | Admitting: Emergency Medicine

## 2014-02-18 VITALS — BP 124/86 | HR 88 | Temp 98.3°F | Resp 18 | Ht 73.0 in | Wt 187.0 lb

## 2014-02-18 DIAGNOSIS — C649 Malignant neoplasm of unspecified kidney, except renal pelvis: Secondary | ICD-10-CM

## 2014-02-18 DIAGNOSIS — D4819 Other specified neoplasm of uncertain behavior of connective and other soft tissue: Secondary | ICD-10-CM

## 2014-02-18 DIAGNOSIS — D481 Neoplasm of uncertain behavior of connective and other soft tissue: Secondary | ICD-10-CM

## 2014-02-18 NOTE — Progress Notes (Signed)
   Subjective:    Patient ID: Douglas [name], male    DOB: 02/20/1956, 58 y.o.   MRN: [id number]  HPI patient just returned from [place]. He has multiple questions regarding his recent visit for followup of renal cell cancer.    Review of Systems     Objective:   Physical Exam patient not examined today.        Assessment & Plan:  Patient will see me tomorrow at 104 to discuss the issues he has gone on .

## 2014-02-19 ENCOUNTER — Encounter: Payer: Self-pay | Admitting: Emergency Medicine

## 2014-02-19 ENCOUNTER — Ambulatory Visit (INDEPENDENT_AMBULATORY_CARE_PROVIDER_SITE_OTHER): Payer: BC Managed Care – PPO | Admitting: Emergency Medicine

## 2014-02-19 VITALS — BP 110/74 | HR 99 | Temp 98.2°F | Resp 16 | Ht 72.5 in | Wt 186.0 lb

## 2014-02-19 DIAGNOSIS — R21 Rash and other nonspecific skin eruption: Secondary | ICD-10-CM

## 2014-02-19 DIAGNOSIS — I2510 Atherosclerotic heart disease of native coronary artery without angina pectoris: Secondary | ICD-10-CM

## 2014-02-19 DIAGNOSIS — I2584 Coronary atherosclerosis due to calcified coronary lesion: Secondary | ICD-10-CM

## 2014-02-19 LAB — POCT SKIN KOH: Skin KOH, POC: NEGATIVE

## 2014-02-19 MED ORDER — HYDROCODONE-ACETAMINOPHEN 7.5-325 MG PO TABS: 1.0000 | ORAL_TABLET | ORAL | Status: DC | PRN

## 2014-02-19 NOTE — Progress Notes (Addendum)
Subjective:  This chart was scribed for [name], MD by Ladene Artist, ED Scribe. The patient was seen in room 21. Patient's care was started at 1:16 PM.   Patient ID: Douglas [name], male    DOB: [birth year], 58 y.o.   MRN: [id number]  Chief Complaint  Patient presents with  . Follow-up   HPI HPI Comments: Douglas [name] is a 58 y.o. male,with a h/o HTN, hyperlipidemia, renal cell carcinoma, presents to the [facility] for follow[facility]up regarding [facility] for [facility] cell carcinoma. Pt saw Dr. Tammi Klippel this morning who suspects that constant, sharp RLQ pain is attributed to incision site. UA and culture was obtained at visit which looked fine.   Pt also reports gradually worsening L shoulder pain since 10/2013. Pt follows up with Dr. Mardelle Matte.    Pt also reports a new rash to the back of his neck. He has applied hydrocortisone cream and bacitracin to the area with no relief.    Pt's last stress test was 5-6 years ago.   Pt had a MRI in 07/2013 and again prior to surgery. He states that he had to take an Ativan tablet before MRI. Next MRI is in 04/2014. Pt requests a prescription for Ativan prior to next MRI.   Pt has returned to working regularly. 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   Past Medical History  Diagnosis Date  . Renal calculi   . Compression fracture     S/P L-SPINE; pt does not recall this hx on 10/16/2013  . GERD (gastroesophageal reflux disease)   . History of surgical fusion joint     DDD C-SPINE  . Hemoptysis   . Allergic rhinitis   . Hyperlipidemia   . Encounter for long-term (current) use of other medications   . COPD (chronic obstructive pulmonary disease)     FVC .66  . Tobacco use disorder   . Melanoma in situ   . Unspecified vitamin D deficiency   . Leiomyoma OF SKIN    INCREASED RISK RENAL CELL CA  NEEDS CT OF KIDNEYS EVERY 2 YEARS NEXT DUE  02/06/13  . Hypertension   . Neuromuscular disorder   .  History of blood transfusion 09/2013    "think so; not sure; related to big OR"  . Osteoarthritis   . Arthritis     "all over my body" (10/16/2013)  . Chronic back pain     "neck to lower back" (10/16/2013)  . Depression     "situational since 09/22/2013 OR"  . Melanoma of back   . 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 Renal cell carcinoma     type 2; papillary   Current Outpatient Prescriptions on File Prior to Visit  Medication Sig Dispense Refill  . atorvastatin (LIPITOR) 10 MG tablet Take 10 mg by mouth at bedtime.      . B Complex-C (B-COMPLEX WITH VITAMIN C) tablet Take 1 tablet by mouth daily.      . cholecalciferol (VITAMIN D) 1000 UNITS tablet Take 1,000 Units by mouth daily.      . fluticasone (FLONASE) 50 MCG/ACT nasal spray Place 1 spray into both nostrils at bedtime.      Marland Kitchen HYDROcodone-acetaminophen (NORCO) 7.5-325 MG per tablet Take 1 tablet by mouth every 4 (four) hours as needed for moderate pain.  60 tablet  0  . losartan (COZAAR) 50 MG tablet Take 50 mg by mouth daily  before breakfast.      . Multiple Vitamin (MULTIVITAMIN WITH MINERALS) TABS tablet Take 1 tablet by mouth daily.      . nicotine polacrilex (NICORETTE) 4 MG gum Take 4 mg by mouth as needed for smoking cessation.      Marland Kitchen omeprazole (PRILOSEC OTC) 20 MG tablet Take 20 mg by mouth daily.      . vitamin C (ASCORBIC ACID) 500 MG tablet Take 500 mg by mouth daily.      . cephALEXin (KEFLEX) 500 MG capsule Take 1 capsule (500 mg total) by mouth 4 (four) times daily.  40 capsule  0   No current facility-administered medications on file prior to visit.   Allergies  Allergen Reactions  . Avelox [Moxifloxacin Hcl In Nacl] Hives, Shortness Of Breath and Swelling   Review of Systems  Gastrointestinal: Positive for abdominal pain.  Musculoskeletal: Positive for arthralgias (prior).  Skin: Positive for rash.      Objective:   Physical Exam CONSTITUTIONAL: Well developed/well nourished HEAD:  Normocephalic/atraumatic EYES: EOMI/PERRL ENMT: Mucous membranes moist NECK: supple no meningeal signs SPINE:entire spine nontender CV: S1/S2 noted, no murmurs/rubs/gallops noted LUNGS: Lungs are clear to auscultation bilaterally, no apparent distress ABDOMEN: soft, nontender, no rebound or guarding, multiple scars over abdomen, mild swelling to RLQ GU:no cva tenderness NEURO: Pt is awake/alert, moves all extremitiesx4 EXTREMITIES: pulses normal, full ROM, muscle wasting along L shoulder, pain with internal and external rotation SKIN: warm, color normal, raised red bumps on the back of neck PSYCH: no abnormalities of mood noted    Assessment & Plan:  Pain meds were refilled. Advised to make an appointment to see Dr. Mardelle Matte. He will recheck next week and we'll do a culture at that time. Also need to do pulmonary function tests at that time. Referral made to Dr. Einar Gip for evaluation of coronary calcifications. He has a rash on the back of his neck he will use Kenalog for this . I personally performed the services described in this documentation, which was scribed in my presence. The recorded information has been reviewed and is accurate.

## 2014-02-24 ENCOUNTER — Encounter: Payer: Self-pay | Admitting: Emergency Medicine

## 2014-02-24 ENCOUNTER — Ambulatory Visit (INDEPENDENT_AMBULATORY_CARE_PROVIDER_SITE_OTHER): Payer: BC Managed Care – PPO | Admitting: Emergency Medicine

## 2014-02-24 VITALS — BP 118/82 | HR 67 | Temp 98.6°F | Resp 16 | Ht 72.5 in | Wt 186.6 lb

## 2014-02-24 DIAGNOSIS — N390 Urinary tract infection, site not specified: Secondary | ICD-10-CM

## 2014-02-24 NOTE — Progress Notes (Signed)
Subjective:  This chart was scribed for [name], MD by Donato Schultz, Medical Scribe. This patient was seen in Room 21 and the patient's care was started at 4:16 PM.   Patient ID: Douglas [name], male    DOB: 02-16-1956, 58 y.o.   MRN: [id number]  HPI HPI Comments: Douglas [name] is a 58 y.o. male who presents to the [facility] for a follow-up appointment.  He was seen at the [facility] last week and had his stents removed and scans of his kidneys performed.  He has been complaining of constant shoulder pain since his visit at the [facility].  He is going to make an appointment with Dr. Karin Lieu.    He stopped taking Keflex 5 days ago.  There is still some confusion regarding what side his kidney biopsy was performed on and he is in the process of getting clarity.  He is having all of his medical records and notes from that procedure sent to him directly from the [facility].  [facility] is [facility] before [facility].     Past Medical History  Diagnosis Date  . Renal calculi   . Compression fracture     S/P L-SPINE; pt does not recall this hx on 10/16/2013  . GERD (gastroesophageal reflux disease)   . History of surgical fusion joint     DDD C-SPINE  . Hemoptysis   . Allergic rhinitis   . Hyperlipidemia   . Encounter for long-term (current) use of other medications   . COPD (chronic obstructive pulmonary disease)     FVC .66  . Tobacco use disorder   . Melanoma in situ   . Unspecified vitamin D deficiency   . Leiomyoma OF SKIN    INCREASED RISK RENAL CELL CA  NEEDS CT OF KIDNEYS EVERY 2 YEARS NEXT DUE  02/06/13  . Hypertension   . Neuromuscular disorder   . History of blood transfusion 09/2013    "think so; not sure; related to big OR"  . Osteoarthritis   . Arthritis     "all over my body" (10/16/2013)  . Chronic back pain     "neck to lower back" (10/16/2013)  . Depression     "situational since 09/22/2013 OR"  . Melanoma of back   . Hereditary  leiomyomatosis and renal cell cancer (HLRCC)   . Renal cell carcinoma     type 2; papillary   Past Surgical History  Procedure Laterality Date  . Anterior cruciate ligament repair Bilateral [id number]    [phone]  . Anterior cervical decomp/discectomy fusion  1992  . Hand ligament reconstruction Right   . Robotic assited partial nephrectomy  05/22/2012    Procedure: ROBOTIC ASSITED PARTIAL NEPHRECTOMY;  Surgeon: Alexis Frock, MD;  Location: [facility];  Service: Urology;  Laterality: Right;  Right Robotic Cyst Decortication and Partial Nephrectomy   . Spine surgery    . Right colectomy Right 09/22/2013  . Cholecystectomy  09/22/2013    "had tumors in it"  . Appendectomy  09/22/2013  . Removal of fascia overlying the right psoas Right 09/22/2013  . Partial nephrectomy Right 09/22/2013  . Renal artery stent Right 09/22/2013  . Lymph node dissection Right 09/22/2013    "in the area of the kidney; they were clean"  . Harvest bone graft Right 1992    "hip; for neck fusion"  . Inguinal hernia repair Right 2004  . Ligament repair Right ~ 2001    "little finger"  .  Melanoma excision  ~ 2007    "off my back"   Family History  Problem Relation Age of Onset  . Adopted: Yes  . Hypertension Father    History   Social History  . Marital Status: Married    Spouse Name: N/A    Number of Children: N/A  . 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 Smoking status: Current Every Day Smoker -- 0.50 packs/day for 40 years    Types: Cigarettes  . Smokeless tobacco: Never Used     Comment: Pt chewing Nicorette gum currently  . Alcohol Use: Yes     Comment: 10/16/2013 "2-3 beers daily generally; nothing last month or so"  . Drug Use: No  . Sexual Activity: Not Currently   Other Topics Concern  . Not on file   Social History Narrative   Walks daily for exercise.   Allergies  Allergen Reactions  . Avelox [Moxifloxacin Hcl In  Nacl] Hives, Shortness Of Breath and Swelling    Review of Systems  Musculoskeletal: Positive for arthralgias.     Objective:  Physical Exam  Nursing note and vitals reviewed. Constitutional: He is oriented to person, place, and time. He appears well-developed and well-nourished.  HENT:  Head: Normocephalic and atraumatic.  Eyes: EOM are normal.  Neck: Normal range of motion.  Cardiovascular: Normal rate, regular rhythm and normal heart sounds.  Exam reveals no gallop and no friction rub.   No murmur heard. Pulmonary/Chest: Effort normal and breath sounds normal. No respiratory distress. He has no wheezes. He has no rales.  Abdominal: Soft. Bowel sounds are normal. There is tenderness.  Persistent tenderness and fullness in the right lower abdomen.  A large vertical and horizontal scar from previous surgery.  Musculoskeletal: Normal range of motion. He exhibits no edema.  Neurological: He is alert and oriented to person, place, and time.  Skin: Skin is warm and dry.  Psychiatric: He has a normal mood and affect. His behavior is normal.     BP 118/82  Pulse 67  Temp(Src) 98.6 F (37 C) (Oral)  Resp 16  Ht 6' 0.5" (1.842 m)  Wt 186 lb 9.6 oz (84.641 kg)  BMI 24.95 kg/m2  SpO2 97% Assessment & Plan:  I personally performed the services described in this documentation, which was scribed in my presence. The recorded information has been reviewed and is accurate.  Patient enters today to send off a urine for culture to see if he has developed a recurrent infection following stent removal. Records have been requested to be sure the biopsy he had done was of the right ilium. The area of abnormality is involving the left ilium and is read as metastatic disease

## 2014-02-25 LAB — URINE CULTURE
Colony Count: NO GROWTH
ORGANISM ID, BACTERIA: NO GROWTH

## 2014-03-10 ENCOUNTER — Ambulatory Visit (INDEPENDENT_AMBULATORY_CARE_PROVIDER_SITE_OTHER): Payer: BC Managed Care – PPO | Admitting: Emergency Medicine

## 2014-03-10 ENCOUNTER — Encounter: Payer: Self-pay | Admitting: Emergency Medicine

## 2014-03-10 VITALS — BP 119/77 | HR 73 | Temp 98.1°F | Resp 16 | Ht 72.5 in | Wt 187.4 lb

## 2014-03-10 DIAGNOSIS — M949 Disorder of cartilage, unspecified: Secondary | ICD-10-CM

## 2014-03-10 DIAGNOSIS — I10 Essential (primary) hypertension: Secondary | ICD-10-CM

## 2014-03-10 DIAGNOSIS — R109 Unspecified abdominal pain: Secondary | ICD-10-CM

## 2014-03-10 DIAGNOSIS — M25519 Pain in unspecified shoulder: Secondary | ICD-10-CM

## 2014-03-10 DIAGNOSIS — C649 Malignant neoplasm of unspecified kidney, except renal pelvis: Secondary | ICD-10-CM

## 2014-03-10 DIAGNOSIS — M25512 Pain in left shoulder: Secondary | ICD-10-CM

## 2014-03-10 DIAGNOSIS — M899 Disorder of bone, unspecified: Secondary | ICD-10-CM

## 2014-03-10 MED ORDER — HYDROCODONE-ACETAMINOPHEN 7.5-325 MG PO TABS: 1.0000 | ORAL_TABLET | ORAL | Status: DC | PRN

## 2014-03-10 NOTE — Progress Notes (Signed)
   Subjective:    Patient ID: Douglas [name], male    DOB: 06/15/1956, 58 y.o.   MRN: [id number] This chart was scribed for [name], MD by [name], ED Scribe. This patient was seen in room Room/bed 22 and the patient's care was started at 2:05 PM.   HPI HPI Comments: Douglas [name] is a 58 y.o. male with a hx of renal cell cancer with metastases who presents to the [facility] for a follow-up. Patient has been seeing an oncologist at the [facility]. He has brought various medical documents to go over. He had a bone biopsy done at the [facility] and has some concerns about some noduality in the left ileum. He has another PET scan scheduled in about 2 months. Patient has a PSHx of surgery on his left shoulder. He saw Dr. Mardelle Matte yesterday and he was given a shot for his left shoulder pain. Patient is concerned about weakness and nerve damage to his left shoulder. He is a current <0.5ppd smoker and is otherwise healthy. Patient is currently taking pain medications and needs some refills.     Review of Systems  Constitutional: Negative for fever.  Musculoskeletal: Positive for arthralgias (left shoulder).       Objective:   Physical Exam CONSTITUTIONAL: Well developed/well nourished HEAD: Normocephalic/atraumatic EYES: EOM/PERRL ENMT: Mucous membranes moist NECK: supple no meningeal signs SPINE: entire spine nontender CV: S1/S2 noted, no murmurs/rubs/gallops noted LUNGS: Lungs are clear to auscultation bilaterally, no apparent distress ABDOMEN: soft, no rebound or guarding, tenderness and fullness right side of abdomen that has started following surgery GU: no cva tenderness NEURO: Pt is awake/alert, moves all extremitiesx4 EXTREMITIES: pulses normal, full ROM SKIN: warm, color normal PSYCH: no abnormalities of mood noted        Assessment & Plan:  [facility] called to see if we can get a second opinion on his bone biopsy. Message left to see if we can get a  second opinion. Pain meds were refilled. I will see him after his visit [facility] at [facility]. He is scheduled to see his cardiologist regarding coronary calcifications. I personally performed the services described in this documentation, which was scribed in my presence. The recorded information has been reviewed and is accurate.

## 2014-03-10 NOTE — Addendum Note (Signed)
Addended by: [name] A on: 03/10/2014 03:36 PM   Modules accepted: Orders

## 2014-03-14 ENCOUNTER — Encounter: Payer: Self-pay | Admitting: Radiology

## 2014-03-23 ENCOUNTER — Other Ambulatory Visit: Payer: Self-pay | Admitting: Emergency Medicine

## 2014-03-24 ENCOUNTER — Other Ambulatory Visit: Payer: Self-pay

## 2014-03-24 MED ORDER — LOSARTAN POTASSIUM 50 MG PO TABS: ORAL_TABLET | ORAL | Status: DC

## 2014-03-24 NOTE — Telephone Encounter (Signed)
Dr Everlene Farrier, you just saw pt for check up, but I don't see Flonase discussed recently, and last lipid test was 03/2013. Can we give RFs on these two meds?

## 2014-03-25 ENCOUNTER — Encounter: Payer: Self-pay | Admitting: Cardiovascular Disease

## 2014-03-25 ENCOUNTER — Ambulatory Visit (INDEPENDENT_AMBULATORY_CARE_PROVIDER_SITE_OTHER): Payer: BC Managed Care – PPO | Admitting: Cardiovascular Disease

## 2014-03-25 VITALS — BP 118/72 | HR 97 | Ht 72.5 in | Wt 184.0 lb

## 2014-03-25 DIAGNOSIS — I10 Essential (primary) hypertension: Secondary | ICD-10-CM

## 2014-03-25 DIAGNOSIS — I2510 Atherosclerotic heart disease of native coronary artery without angina pectoris: Secondary | ICD-10-CM | POA: Insufficient documentation

## 2014-03-25 DIAGNOSIS — E785 Hyperlipidemia, unspecified: Secondary | ICD-10-CM

## 2014-03-25 DIAGNOSIS — R079 Chest pain, unspecified: Secondary | ICD-10-CM

## 2014-03-25 DIAGNOSIS — J438 Other emphysema: Secondary | ICD-10-CM

## 2014-03-25 NOTE — Assessment & Plan Note (Signed)
Counseled on smoking cessation no active wheezing  CT chest with no lung pathology f/u primary

## 2014-03-25 NOTE — Assessment & Plan Note (Signed)
As evidenced by coronary calcium seen in LAD/Circumflex on Chest CT.  ECG normal no complications during recent surgery  F/U ETT

## 2014-03-25 NOTE — Progress Notes (Signed)
Patient ID: Douglas [name], male   DOB: 15-Oct-1955, 58 y.o.   MRN: [id number]    58 yo referred for coronary artery calcification seen on CT scan done at [place] in August.  Has metastatic renal cell CA post surgery earlier this year with colectomy partial nephrectomy and uretal stent.  PET 8/15 with persistent hypermetabolic foci in left hip  Biopsy not CA  CRF smoking and HTN.  CT chest done 8/25 for CA showed calcification of the LAD and circumflex arteries Report in Epic but actual images not available   His CA is rate Some sort of papillary leiyomyoma.  Originally diagnosed by Dr Wilhemina Bonito from skin lesion and confirmed with genetic testing.  There is no cure and there is no chemoRx or Radiation for it  He denies SSCP, palpitations or dyspnea  Surgical scars have healed well  No hematuria. Compliant with meds.  No previous stress testing    ROS: Denies fever, malais, weight loss, blurry vision, decreased visual acuity, cough, sputum, SOB, hemoptysis, pleuritic pain, palpitaitons, heartburn, abdominal pain, melena, lower extremity edema, claudication, or rash.  All other systems reviewed and negative   General: Affect appropriate Healthy:  appears stated age 58: normal Neck supple with no adenopathy JVP normal no bruits no thyromegaly Lungs clear with no wheezing and good diaphragmatic motion Heart:  S1/S2 no murmur,rub, gallop or click PMI normal Abdomen: benighn, BS positve, no tenderness, no AAA lap surgical scan and large midline scar from recent surgery  no bruit.  No HSM or HJR Distal pulses intact with no bruits No edema Neuro non-focal Skin warm and dry No muscular weakness  Medications Current Outpatient Prescriptions  Medication Sig Dispense Refill  . atorvastatin (LIPITOR) 10 MG tablet Take 10 mg by mouth at bedtime.      . B Complex-C (B-COMPLEX WITH VITAMIN C) tablet Take 1 tablet by mouth daily.      . cholecalciferol (VITAMIN D) 1000 UNITS tablet Take 1,000 Units by  mouth daily.      . fluticasone (FLONASE) 50 MCG/ACT nasal spray Place 1 spray into both nostrils at bedtime.      Marland Kitchen HYDROcodone-acetaminophen (NORCO) 7.5-325 MG per tablet Take 1 tablet by mouth every 4 (four) hours as needed for moderate pain.  60 tablet  0  . losartan (COZAAR) 50 MG tablet TAKE 1 BY MOUTH DAILY BEFORE BREAKFAST  90 tablet  1  . Multiple Vitamin (MULTIVITAMIN WITH MINERALS) TABS tablet Take 1 tablet by mouth daily.      . nicotine polacrilex (NICORETTE) 4 MG gum Take 4 mg by mouth as needed for smoking cessation.      Marland Kitchen omeprazole (PRILOSEC OTC) 20 MG tablet Take 20 mg by mouth daily.      . vitamin C (ASCORBIC ACID) 500 MG tablet Take 500 mg by mouth daily.       No current facility-administered medications for this visit.    Allergies Avelox and Moxifloxacin  Family History: Family History  Problem Relation Age of Onset  . Adopted: Yes  . Hypertension Father     Social History: History   Social History  . Marital Status: Married    Spouse Name: N/A    Number of Children: N/A  . 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 Smoking status: Current Every Day Smoker -- 0.50 packs/day for 40 years    Types: Cigarettes  . Smokeless tobacco:  Never Used     Comment: Pt chewing Nicorette gum currently  . Alcohol Use: Yes     Comment: 10/16/2013 "2-3 beers daily generally; nothing last month or so"  . Drug Use: No  . Sexual Activity: Not Currently   Other Topics Concern  . Not on file   Social History Narrative   Walks daily for exercise.    Electrocardiogram:  Today NSR rate 97 normal   Assessment and Plan

## 2014-03-25 NOTE — Patient Instructions (Signed)
Your physician wants you to follow-up in:   YEAR WITH  DR NISHAN You will receive a reminder letter in the mail two months in advance. If you don't receive a letter, please call our office to schedule the follow-up appointment.  Your physician recommends that you continue on your current medications as directed. Please refer to the Current Medication list given to you today.   Your physician has requested that you have an exercise tolerance test. For further information please visit [email or web address]. Please also follow instruction sheet, as given.  

## 2014-03-25 NOTE — Assessment & Plan Note (Signed)
Cholesterol is at goal.  Continue current dose of statin and diet Rx.  No myalgias or side effects.  F/U  LFT's in 6 months. Lab Results  Component Value Date   LDLCALC 72 03/25/2013   LDL goal less than 70 with coronary calcium seen on CT

## 2014-03-25 NOTE — Assessment & Plan Note (Signed)
Well controlled.  Continue current medications and low sodium Dash type diet.    

## 2014-04-01 ENCOUNTER — Ambulatory Visit (INDEPENDENT_AMBULATORY_CARE_PROVIDER_SITE_OTHER): Payer: BC Managed Care – PPO | Admitting: Emergency Medicine

## 2014-04-01 VITALS — BP 114/70 | HR 76 | Temp 98.4°F | Resp 16 | Ht 72.0 in | Wt 188.6 lb

## 2014-04-01 DIAGNOSIS — Z87440 Personal history of urinary (tract) infections: Secondary | ICD-10-CM

## 2014-04-01 DIAGNOSIS — M25512 Pain in left shoulder: Secondary | ICD-10-CM

## 2014-04-01 DIAGNOSIS — R21 Rash and other nonspecific skin eruption: Secondary | ICD-10-CM

## 2014-04-01 DIAGNOSIS — C649 Malignant neoplasm of unspecified kidney, except renal pelvis: Secondary | ICD-10-CM

## 2014-04-01 MED ORDER — MUPIROCIN 2 % EX OINT: 1.0000 "application " | TOPICAL_OINTMENT | Freq: Two times a day (BID) | CUTANEOUS | Status: DC

## 2014-04-01 MED ORDER — LORAZEPAM 1 MG PO TABS: ORAL_TABLET | ORAL | Status: DC

## 2014-04-01 MED ORDER — HYDROCODONE-ACETAMINOPHEN 7.5-325 MG PO TABS: 1.0000 | ORAL_TABLET | ORAL | Status: DC | PRN

## 2014-04-01 NOTE — Progress Notes (Addendum)
   Subjective:    Patient ID: Douglas [name], male    DOB: 10-27-1955, 58 y.o.   MRN: [id number]  This chart was scribed for [name], MD by Edison Simon, ED Scribe. This patient was seen in room 10 and the patient's care was started at 8:50 AM.   HPI  HPI Comments: Douglas [name] is a 58 y.o. male with papillary renal cancer who presents to the [facility] with questions about his pathology report. He goes to the [facility] in [place] and has a doctor here too. Discussed with him that everything in his pathology report is benign. He states he went to his cancer support group last night and finds it helpful. He states he has 2 new nodules. He states he is [facility] right before [facility]; I recommended systemic treatment over surgery, though he is inclined toward surgery. He states he saw [name] recently who recommended a non-nuclear stress test. He reports playing golf and mowing his lawn recently for the first time in a while. He states he is getting shots to his shoulder. He expresses concern about a rash to his posterior neck. He states he is trying to decrease his smoking. He states he is taking 2.5-3 doses of his prescribed pain medication a day. He states that after his surgery, but testicles are sometimes tender and seem to hang lower; he also states that he has decreased force of ejaculation.  Review of Systems  Genitourinary:       Testicles tender   Musculoskeletal: Positive for arthralgias.  Skin: Positive for rash.       Objective:   Physical Exam CONSTITUTIONAL: Well developed/well nourished HEAD: Normocephalic/atraumatic EYES: EOMI/PERRL ENMT: Mucous membranes moist NECK: supple no meningeal signs SPINE:entire spine nontender CV: S1/S2 noted, no murmurs/rubs/gallops noted LUNGS: Few coarse rhonchi, no apparent distress ABDOMEN: soft, guarding, large scar in the upper and right mid abdomen, tenderness to palpation involving the whole  right side without rebound GU:no cva tenderness, large hydrocele to left testicle NEURO: Pt is awake/alert, moves all extremitiesx4 EXTREMITIES: pulses normal, full ROM, left shoulder pain with abduction and rotation of left shoulder SKIN: warm, color normal, Raised maculopapular rash involving back of the neck PSYCH: no abnormalities of mood noted      Assessment & Plan:    patient stable at present. He is going to a cancer support group and this has helped a lot. He continues to smoke. I did refill his pain medication in that he continues to have significant left shoulder pain. His flu shot was given yesterday will followup after [facility] I personally performed the services described in this documentation, which was scribed in my presence. The recorded information has been reviewed and is accurate. Pain meds were refilled today

## 2014-04-22 ENCOUNTER — Ambulatory Visit (INDEPENDENT_AMBULATORY_CARE_PROVIDER_SITE_OTHER): Payer: BC Managed Care – PPO | Admitting: Emergency Medicine

## 2014-04-22 VITALS — BP 128/82 | HR 84 | Temp 98.7°F | Resp 16 | Ht 72.0 in | Wt 190.0 lb

## 2014-04-22 DIAGNOSIS — Z87440 Personal history of urinary (tract) infections: Secondary | ICD-10-CM

## 2014-04-22 DIAGNOSIS — M25512 Pain in left shoulder: Secondary | ICD-10-CM

## 2014-04-22 MED ORDER — HYDROCODONE-ACETAMINOPHEN 7.5-325 MG PO TABS: 1.0000 | ORAL_TABLET | ORAL | Status: DC | PRN

## 2014-04-22 NOTE — Progress Notes (Addendum)
Subjective:  This chart was scribed for [name], MD by Ladene Artist, ED Scribe. The patient was seen in room 12. Patient's care was started at 9:27 AM.   Patient ID: Douglas [name], male    DOB: 1955/07/13, 58 y.o.   MRN: [id number]  Chief Complaint  Patient presents with  . Follow-up  . Medication Refill   HPI HPI Comments: Douglas [name] is a 58 y.o. male, with a h/o renal cell carcinoma, who presents to the [facility] for medication refill. Pt is taking 2.5 pain pills daily for L shoulder pain. He plans to reduce this to 2 pills per day. Pt denies any dysuria, hematuria, difficulty urinating, constipation, urinary or bowel incontinence.   Rash Pt saw Dr. Danella Sensing at [facility] for ring worm following last office visit. He was prescribed medication for 10 days which has provided relief.   Stress Test Pt is scheduled to have stress test done tomorrow. Last stress test was 5-6 years ago.   [facility] Pt's next appointment is in 2 weeks.  Support Group Next meeting on 04/28/14. Pt states that this is the best thing that he has decided to participate in.   Pt is a smoker. He reports having 6 cigarettes yesterday.   Past Medical History  Diagnosis Date  . Renal calculi   . Compression fracture     S/P L-SPINE; pt does not recall this hx on 10/16/2013  . GERD (gastroesophageal reflux disease)   . History of surgical fusion joint     DDD C-SPINE  . Hemoptysis   . Allergic rhinitis   . Hyperlipidemia   . Encounter for long-term (current) use of other medications   . COPD (chronic obstructive pulmonary disease)     FVC .66  . Tobacco use disorder   . Melanoma in situ   . Unspecified vitamin D deficiency   . Leiomyoma OF SKIN    INCREASED RISK RENAL CELL CA  NEEDS CT OF KIDNEYS EVERY 2 YEARS NEXT DUE  02/06/13  . Hypertension   . Neuromuscular disorder   . History of blood transfusion 09/2013    "think so; not sure; related  to big OR"  . Osteoarthritis   . Arthritis     "all over my body" (10/16/2013)  . Chronic back pain     "neck to lower back" (10/16/2013)  . Depression     "situational since 09/22/2013 OR"  . Melanoma of back   . 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 Renal cell carcinoma     type 2; papillary   Current Outpatient Prescriptions on File Prior to Visit  Medication Sig Dispense Refill  . atorvastatin (LIPITOR) 10 MG tablet Take 10 mg by mouth at bedtime.    . B Complex-C (B-COMPLEX WITH VITAMIN C) tablet Take 1 tablet by mouth daily.    . cholecalciferol (VITAMIN D) 1000 UNITS tablet Take 1,000 Units by mouth daily.    . fluticasone (FLONASE) 50 MCG/ACT nasal spray Place 1 spray into both nostrils at bedtime.    Marland Kitchen HYDROcodone-acetaminophen (NORCO) 7.5-325 MG per tablet Take 1 tablet by mouth every 4 (four) hours as needed for moderate pain. 60 tablet 0  . LORazepam (ATIVAN) 1 MG tablet Take one tablet approximately one hour prior to your scan 5 tablet 0  . losartan (COZAAR) 50 MG tablet TAKE 1 BY MOUTH DAILY BEFORE BREAKFAST 90 tablet 1  . Multiple Vitamin (MULTIVITAMIN WITH MINERALS)  TABS tablet Take 1 tablet by mouth daily.    . mupirocin ointment (BACTROBAN) 2 % Apply 1 application topically 2 (two) times daily. Apply to back of neck twice a day 22 g 1  . nicotine polacrilex (NICORETTE) 4 MG gum Take 4 mg by mouth as needed for smoking cessation.    Marland Kitchen omeprazole (PRILOSEC OTC) 20 MG tablet Take 20 mg by mouth daily.    . vitamin C (ASCORBIC ACID) 500 MG tablet Take 500 mg by mouth daily.     No current facility-administered medications on file prior to visit.   Review of Systems  Constitutional: Negative for fatigue and unexpected weight change.  Eyes: Negative for visual disturbance.  Respiratory: Negative for cough, chest tightness and shortness of breath.   Cardiovascular: Negative for chest pain.  Gastrointestinal: Positive for abdominal pain. Negative for  constipation.  Genitourinary: Negative.  Negative for dysuria, hematuria and difficulty urinating.  Musculoskeletal: Positive for arthralgias.  Neurological: Negative for dizziness, light-headedness and headaches.      Objective:   Physical Exam CONSTITUTIONAL: Well developed/well nourished HEAD: Normocephalic/atraumatic EYES: EOMI/PERRL ENMT: Mucous membranes moist NECK: supple no meningeal signs SPINE:entire spine nontender CV: S1/S2 noted, no murmurs/rubs/gallops noted LUNGS: Lungs are clear to auscultation bilaterally, no apparent distress ABDOMEN: soft, well-healed surgical scars with mild RLQ tenderness, no rebound GU:no cva tenderness NEURO: Pt is awake/alert, moves all extremitiesx4 EXTREMITIES: pulses normal, very limited ROM of L shoulder with muscle wasting of L shoulder SKIN: warm, color normal PSYCH: no abnormalities of mood noted    Assessment & Plan:  Hydrocodone was refilled. He was given #60 tablets. He takes 2-1/2-3 tablets a day which means he could call for a refill in 3 weeks and have it refilled. He is scheduled to see me December 8. He has an appointment at the [facility] the week before Thanksgiving for his reevaluation and staging of his cancer.screening urine culture was done I personally performed the services described in this documentation, which was scribed in my presence. The recorded information has been reviewed and is accurate.

## 2014-04-23 ENCOUNTER — Encounter: Payer: BC Managed Care – PPO | Admitting: Physician Assistant

## 2014-04-23 LAB — URINE CULTURE
COLONY COUNT: NO GROWTH
Organism ID, Bacteria: NO GROWTH

## 2014-05-11 ENCOUNTER — Telehealth: Payer: Self-pay

## 2014-05-11 DIAGNOSIS — M25512 Pain in left shoulder: Secondary | ICD-10-CM

## 2014-05-11 MED ORDER — HYDROCODONE-ACETAMINOPHEN 7.5-325 MG PO TABS: 1.0000 | ORAL_TABLET | ORAL | Status: DC | PRN

## 2014-05-11 NOTE — Telephone Encounter (Signed)
Pt spoke with Dr. Everlene Farrier today (11/23). Dr. Everlene Farrier said that we can write him a refill of Vicodin to pick up tomorrow.  Pt's # [id number]

## 2014-05-11 NOTE — Telephone Encounter (Signed)
Printed script and put in Dr. Everlene Farrier box for signature.

## 2014-05-26 ENCOUNTER — Ambulatory Visit (INDEPENDENT_AMBULATORY_CARE_PROVIDER_SITE_OTHER): Payer: BC Managed Care – PPO | Admitting: Emergency Medicine

## 2014-05-26 ENCOUNTER — Encounter: Payer: Self-pay | Admitting: Emergency Medicine

## 2014-05-26 VITALS — BP 119/80 | HR 92 | Temp 98.0°F | Resp 16 | Ht 72.5 in | Wt 194.0 lb

## 2014-05-26 DIAGNOSIS — M25512 Pain in left shoulder: Secondary | ICD-10-CM

## 2014-05-26 DIAGNOSIS — C641 Malignant neoplasm of right kidney, except renal pelvis: Secondary | ICD-10-CM

## 2014-05-26 DIAGNOSIS — D481 Neoplasm of uncertain behavior of connective and other soft tissue: Secondary | ICD-10-CM

## 2014-05-26 DIAGNOSIS — F17200 Nicotine dependence, unspecified, uncomplicated: Secondary | ICD-10-CM

## 2014-05-26 DIAGNOSIS — M899 Disorder of bone, unspecified: Secondary | ICD-10-CM

## 2014-05-26 DIAGNOSIS — Z720 Tobacco use: Secondary | ICD-10-CM

## 2014-05-26 MED ORDER — HYDROCODONE-ACETAMINOPHEN 7.5-325 MG PO TABS: 1.0000 | ORAL_TABLET | ORAL | Status: DC | PRN

## 2014-05-26 NOTE — Progress Notes (Signed)
MRN: [id number] DOB: 08-04-1955  Subjective:   Douglas [name] is a 58 y.o. male presenting for 1 month follow up on shoulder pain, history of renal cell carcinoma.  Shoulder - patient has ongoing left shoulder discomfort. Has been using Hydrocodone 3x daily, 2.5 - 3 tablets total in 24 hours, with some relief, still bothering him at night however. Reports that he wakes up a few times a night, is able to go back to sleep though. Patient was wondering if he should consider surgery for torn rotator cuff. Reports that he cut down smoking to ~1/2ppd, denies alcohol use.  H/o of renal cell carcinoma - patient has complicated history of rare renal cell carcinoma, s/p 2x partial right nephrectomy per patient and continues follow up with [facility], Dr. Derrel Nip, and urology, Dr. Tresa Moore. In 04/2014, patient had a follow/monitoring MRI with [facility]. Patient is unclear about the interpretation regarding a lesion in left illiac bone. Would like to have a second opinion and consideration for a second bone biopsy. Patient's concern is that this would potentially be a metastatic bone lesion. His next monitoring MRI with [facility] is scheduled in 08/2013 with Dr. Derrel Nip. Patient reports that she is now back in [place], [place]. Full-time and will be able to follow up with her more regularly.  Admits residual right-sided abdominal pain, unchanged from previous visits. Denies fevers, weight loss, chest pain, shob, n/v, diarrhea. Denies any other aggravating or relieving factors, no other questions or concerns.  Mr. Anas Reister has a current medication list which includes the following prescription(s): atorvastatin, b-complex with vitamin c, cholecalciferol, fluticasone, hydrocodone-acetaminophen, lorazepam, losartan, multivitamin with minerals, nicotine polacrilex, omeprazole, and vitamin c.  Mr. Damone Fancher is allergic to avelox and moxifloxacin.   Past Medical History  Diagnosis Date  . Renal calculi   . Compression  fracture     S/P L-SPINE; pt does not recall this hx on 10/16/2013  . GERD (gastroesophageal reflux disease)   . History of surgical fusion joint     DDD C-SPINE  . Hemoptysis   . Allergic rhinitis   . Hyperlipidemia   . Encounter for long-term (current) use of other medications   . COPD (chronic obstructive pulmonary disease)     FVC .66  . Tobacco use disorder   . Melanoma in situ   . Unspecified vitamin D deficiency   . Leiomyoma OF SKIN    INCREASED RISK RENAL CELL CA  NEEDS CT OF KIDNEYS EVERY 2 YEARS NEXT DUE  02/06/13  . Hypertension   . Neuromuscular disorder   . History of blood transfusion 09/2013    "think so; not sure; related to big OR"  . Osteoarthritis   . Arthritis     "all over my body" (10/16/2013)  . Chronic back pain     "neck to lower back" (10/16/2013)  . Depression     "situational since 09/22/2013 OR"  . Melanoma of back   . 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 Renal cell carcinoma     type 2; papillary     Past Surgical History  Procedure Laterality Date  . Anterior cruciate ligament repair Bilateral [id number]    [phone]  . Anterior cervical decomp/discectomy fusion  1992  . Hand ligament reconstruction Right   . Robotic assited partial nephrectomy  05/22/2012    Procedure: ROBOTIC ASSITED PARTIAL NEPHRECTOMY;  Surgeon: Alexis Frock, MD;  Location: [facility];  Service: Urology;  Laterality: Right;  Right Robotic Cyst Decortication and  Partial Nephrectomy   . Spine surgery    . Right colectomy Right 09/22/2013  . Cholecystectomy  09/22/2013    "had tumors in it"  . Appendectomy  09/22/2013  . Removal of fascia overlying the right psoas Right 09/22/2013  . Partial nephrectomy Right 09/22/2013  . Renal artery stent Right 09/22/2013  . Lymph node dissection Right 09/22/2013    "in the area of the kidney; they were clean"  . Harvest bone graft Right 1992    "hip; for neck fusion"  . Inguinal hernia repair Right 2004  . Ligament repair  Right ~ 2001    "little finger"  . Melanoma excision  ~ 2007    "off my back"     ROS As in subjective.  Objective:   Vitals: BP 119/80 mmHg  Pulse 92  Temp(Src) 98 F (36.7 C)  Resp 16  Ht 6' 0.5" (1.842 m)  Wt 194 lb (87.998 kg)  BMI 25.94 kg/m2  SpO2 97%  Wt Readings from Last 3 Encounters:  05/26/14 194 lb (87.998 kg)  04/22/14 190 lb (86.183 kg)  04/01/14 188 lb 9.6 oz (85.548 kg)   Physical Exam  Constitutional: He is oriented to person, place, and time and well-developed, well-nourished, and in no distress.  Eyes: Conjunctivae are normal. No scleral icterus.  Neck: Normal range of motion. No thyromegaly present.  Cardiovascular: Normal rate, regular rhythm, normal heart sounds and intact distal pulses.  Exam reveals no gallop and no friction rub.   No murmur heard. Pulmonary/Chest: Effort normal and breath sounds normal. No respiratory distress. He has no wheezes. He has no rales.  Abdominal: Soft. Bowel sounds are normal. He exhibits no distension and no mass. There is tenderness (mild in RLQ with mild swelling, not new in onset). There is no rebound and no guarding.  Musculoskeletal: He exhibits tenderness (mild tenderness with deep palpation over left posterior iliac crest). He exhibits no edema.  Lymphadenopathy:    He has no cervical adenopathy.  Neurological: He is alert and oriented to person, place, and time.  Skin: Skin is warm and dry. No rash noted. He is not diaphoretic. No erythema.  Psychiatric: Mood and affect normal.    Assessment and Plan :   1. Pain in joint, shoulder region, left - Advised that he hold off on surgery for the time being until further evaluation of potential bone metastasis - continue pain medication HYDROcodone-acetaminophen (NORCO) 7.5-325 MG per tablet; Take 1 tablet by mouth every 4 (four) hours as needed for moderate pain.  Dispense: 60 tablet; Refill: 0  2. 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 Derrel Nip, and MRI in 08/2013 - follow up with Dr. Everlene Farrier in 07/2013  3. Bone lesion - Discussed MRI results with Dr. Tresa Moore, patient's urologist; he is in agreement to refer to Dr. Redmond Pulling at [facility] for further evaluation and consideration of repeat bone biopsy - Ambulatory referral to Orthopedic Surgery  4. Tobacco use disorder - continue use of nicotine gum, efforts for smoking cessation   Jaynee Eagles, PA-C [facility] [phone] 05/26/2014 10:37 AM

## 2014-05-26 NOTE — Progress Notes (Deleted)
    MRN: [id number] DOB: Mar 14, 1956  Subjective:   Douglas [name] is a 58 y.o. male presenting for ***.  Went to [place] 3 weeks ago, completed MRI, used Ativan. Dr. Tresa Moore, urologist,  2 nodules unchanged from PET scan. Dr. Derrel Nip from [place] with [facility] reported that she saw 2 new lesions with "bad kidney".  *** smoking, *** alcohol.  Denies any other aggravating or relieving factors, no other questions or concerns.  has a current medication list which includes the following prescription(s): atorvastatin, b-complex with vitamin c, cholecalciferol, fluticasone, hydrocodone-acetaminophen, lorazepam, losartan, multivitamin with minerals, nicotine polacrilex, omeprazole, and vitamin c.  is allergic to avelox and moxifloxacin.   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   has past surgical history that includes Anterior cruciate ligament repair (Bilateral, [id number]); Anterior cervical decomp/discectomy fusion (1992); Hand ligament reconstruction (Right); Robotic assited partial nephrectomy (05/22/2012); Spine surgery; Right colectomy (Right, 09/22/2013); Cholecystectomy (09/22/2013); Appendectomy (09/22/2013); removal of fascia overlying the right psoas (Right, 09/22/2013); Partial nephrectomy (Right, 09/22/2013); Renal artery stent (Right, 09/22/2013); Lymph node dissection (Right, 09/22/2013); Harvest bone graft (Right, 1992); Inguinal hernia  repair (Right, 2004); Ligament repair (Right, ~ 2001); and Melanoma excision (~ 2007).  ROS As in subjective.  Objective:   Vitals: BP 119/80 mmHg  Pulse 92  Temp(Src) 98 F (36.7 C)  Resp 16  Ht 6' 0.5" (1.842 m)  Wt 194 lb (87.998 kg)  BMI 25.94 kg/m2  SpO2 97%  Physical Exam  Assessment and Plan :     Jaynee Eagles, PA-C [facility] [phone] 05/26/2014 9:35 AM

## 2014-05-26 NOTE — Patient Instructions (Addendum)
-   Dr. Everlene Farrier has contacted Dr. Tresa Moore regarding recent MRI results, they are in agreement to schedule referral to Dr. Redmond Pulling for further evaluation and consideration for repeat bone biopsy. You should get a call to schedule that appointment, please call Dr. Everlene Farrier and let him know if you have not been contacted for this in 2 weeks. - Continue follow up with Dr. Derrel Nip for your history and monitoring of renal cell carcinoma. Dr. Everlene Farrier will see you before then in February 2016. - Continue smoking cessation efforts.

## 2014-06-02 ENCOUNTER — Telehealth: Payer: Self-pay | Admitting: Family Medicine

## 2014-06-02 ENCOUNTER — Telehealth: Payer: Self-pay | Admitting: *Deleted

## 2014-06-02 ENCOUNTER — Ambulatory Visit (INDEPENDENT_AMBULATORY_CARE_PROVIDER_SITE_OTHER): Payer: BC Managed Care – PPO | Admitting: Physician Assistant

## 2014-06-02 DIAGNOSIS — I2510 Atherosclerotic heart disease of native coronary artery without angina pectoris: Secondary | ICD-10-CM

## 2014-06-02 DIAGNOSIS — M899 Disorder of bone, unspecified: Secondary | ICD-10-CM | POA: Insufficient documentation

## 2014-06-02 DIAGNOSIS — R079 Chest pain, unspecified: Secondary | ICD-10-CM

## 2014-06-02 NOTE — Telephone Encounter (Signed)
PT  AWARE OF  STRESS RESULTS./CY

## 2014-06-02 NOTE — Progress Notes (Signed)
Exercise Treadmill Test  Pre-Exercise Testing Evaluation Rhythm: normal sinus  Rate: 69 bpm     Test  Exercise Tolerance Test Ordering MD: Jenkins Rouge, MD  Interpreting MD: Richardson Dopp, PA-C  Unique Test No: 1  Treadmill:  1  Indication for ETT: chest pain - rule out ischemia  Contraindication to ETT: No   Stress Modality: exercise - treadmill  Cardiac Imaging Performed: non   Protocol: standard Bruce - maximal  Max BP:  164/89  Max MPHR (bpm):  161 85% MPR (bpm):  137  MPHR obtained (bpm):  160 % MPHR obtained:  99  Reached 85% MPHR (min:sec):  8:08 Total Exercise Time (min-sec):  10:00  Workload in METS:  11.4 Borg Scale: 16  Reason ETT Terminated:  desired heart rate attained    ST Segment Analysis At Rest: normal ST segments - no evidence of significant ST depression With Exercise: non-specific ST changes  Other Information Arrhythmia:  No Angina during ETT:  absent (0) Quality of ETT:  diagnostic  ETT Interpretation:  normal - no evidence of ischemia by ST analysis  Comments: Good exercise capacity. No chest pain. Normal BP response to exercise. No significant ST changes to suggest ischemia. There was insignificant up-sloping ST depression at peak exercise.  Recommendations: FU with Dr. Jenkins Rouge as directed. Signed,  Richardson Dopp, PA-C   06/02/2014 9:35 AM

## 2014-06-02 NOTE — Telephone Encounter (Signed)
-----   Message from Josue Hector, MD sent at 06/02/2014  1:02 PM EST -----   ----- Message -----    From: Liliane Shi, PA-C    Sent: 06/02/2014   9:36 AM      To: Josue Hector, MD

## 2014-06-02 NOTE — Progress Notes (Signed)
Normal ETT 

## 2014-06-22 ENCOUNTER — Ambulatory Visit (INDEPENDENT_AMBULATORY_CARE_PROVIDER_SITE_OTHER): Payer: BLUE CROSS/BLUE SHIELD | Admitting: Emergency Medicine

## 2014-06-22 VITALS — BP 120/84 | HR 72 | Temp 97.7°F | Resp 16 | Ht 73.5 in | Wt 197.0 lb

## 2014-06-22 DIAGNOSIS — M25512 Pain in left shoulder: Secondary | ICD-10-CM

## 2014-06-22 MED ORDER — HYDROCODONE-ACETAMINOPHEN 7.5-325 MG PO TABS: 1.0000 | ORAL_TABLET | ORAL | Status: DC | PRN

## 2014-06-22 NOTE — Progress Notes (Signed)
° °  Subjective:  This chart was scribed for Nena Jordan, MD by Dellis Filbert, ED Scribe at [facility].The patient was seen in exam room 02 and the patient's care was started at 11:19 AM.   Patient ID: Douglas [name], male    DOB: Apr 06, 1956, 59 y.o.   MRN: [id number] Chief Complaint  Patient presents with   Medication Refill    Vicodin   Follow-up   Bone specialist   HPI HPI Comments: Douglas [name] is a 59 y.o. male stage III renal cancer who presents to [facility] for a follow up and medication refill. He needs a refill for his Vicodin. Will follow up in April with Dr. Redmond Pulling. Has an appoitnemt with [facility] on the second of March. Has had a bone biopsy. Has had several scans done for surveillance of metastasis of his renal cancer.  He also has ongoing left shoulder pain. Pt is done to 4-5 cigarettes a day. No more UTI after stents. Has some nerve damage and does not feel the need to urinate.  Review of Systems  Musculoskeletal: Positive for arthralgias.      Objective:  BP 120/84 mmHg   Pulse 72   Temp(Src) 97.7 F (36.5 C) (Oral)   Resp 16   Ht 6' 1.5" (1.867 m)   Wt 197 lb (89.359 kg)   BMI 25.64 kg/m2   SpO2 98%  Physical Exam  Constitutional: He is oriented to person, place, and time. He appears well-developed and well-nourished.  HENT:  Head: Normocephalic and atraumatic.  Eyes: EOM are normal.  Neck: Normal range of motion.  Cardiovascular: Normal rate, regular rhythm and normal heart sounds.   Pulmonary/Chest: Effort normal and breath sounds normal.  Abdominal:  Large horizontal scar across the upper abdomen with tenderness at the right mid and lower abdomen.  Musculoskeletal: Normal range of motion.  Inability to abduct the left shoulder with pain of the left shoulder.  Neurological: He is alert and oriented to person, place, and time.  Skin: Skin is warm and dry.  Psychiatric: He has a normal mood and affect. His behavior is normal.  Nursing note and  vitals reviewed.      Assessment & Plan:  Pain medication refilled, will see pt after visit with [facility]. Patient is dealing with a lot of issues with his wife who has been unable to return to work due to headaches. He is concerned about his finances and ability to retire. I encouraged him to take one month at a time. He is due to go to the [place] in March and then will follow-up after his visit there.I personally performed the services described in this documentation, which was scribed in my presence. The recorded information has been reviewed and is accurate.

## 2014-06-22 NOTE — Progress Notes (Signed)
   Subjective:  This chart was scribed for Nena Jordan, MD by Dellis Filbert, ED Scribe at [facility].The patient was seen in exam room 02 and the patient's care was started at 11:19 AM.   Patient ID: Douglas [name], male    DOB: 07/29/1955, 59 y.o.   MRN: [id number] Chief Complaint  Patient presents with  . Medication Refill    Vicodin  . Follow-up  . Bone specialist   HPI HPI Comments: Douglas [name] is a 59 y.o. male stage III renal cancer who presents to [facility] for a follow up and medication refill. He needs a refill for his Vicodin. Will follow up in April with Dr. Redmond Pulling. Has an appoitnemt with [facility] on the second of March. Has had a bone biopsy. Has had several scans done for surveillance of metastasis of his renal cancer.  He also has ongoing left shoulder pain. Pt is done to 4-5 cigarettes a day. No more UTI after stents. Has some nerve damage and does not feel the need to urinate.  Review of Systems  Musculoskeletal: Positive for arthralgias.      Objective:  BP 120/84 mmHg  Pulse 72  Temp(Src) 97.7 F (36.5 C) (Oral)  Resp 16  Ht 6' 1.5" (1.867 m)  Wt 197 lb (89.359 kg)  BMI 25.64 kg/m2  SpO2 98%  Physical Exam  Constitutional: He is oriented to person, place, and time. He appears well-developed and well-nourished.  HENT:  Head: Normocephalic and atraumatic.  Eyes: EOM are normal.  Neck: Normal range of motion.  Cardiovascular: Normal rate, regular rhythm and normal heart sounds.   Pulmonary/Chest: Effort normal and breath sounds normal.  Abdominal:  Large horizontal scar across the upper abdomen with tenderness at the right mid and lower abdomen.  Musculoskeletal: Normal range of motion.  Inability to abduct the left shoulder with pain of the left shoulder.  Neurological: He is alert and oriented to person, place, and time.  Skin: Skin is warm and dry.  Psychiatric: He has a normal mood and affect. His behavior is normal.  Nursing note and  vitals reviewed.      Assessment & Plan:  Pain medication refilled, will see pt after visit with [facility]. Patient is dealing with a lot of issues with his wife who has been unable to return to work due to headaches. He is concerned about his finances and ability to retire. I encouraged him to take one month at a time. He is due to go to the [place] in March and then will follow-up after his visit there.I personally performed the services described in this documentation, which was scribed in my presence. The recorded information has been reviewed and is accurate.

## 2014-07-08 ENCOUNTER — Other Ambulatory Visit: Payer: Self-pay

## 2014-07-08 DIAGNOSIS — M25512 Pain in left shoulder: Secondary | ICD-10-CM

## 2014-07-08 MED ORDER — HYDROCODONE-ACETAMINOPHEN 7.5-325 MG PO TABS: 1.0000 | ORAL_TABLET | ORAL | Status: DC | PRN

## 2014-07-08 NOTE — Telephone Encounter (Signed)
Pt LM on my VM asking for RF of his Vicodin. He stated that Dr Everlene Farrier told him to call a few days bf he ran out. He is out of town until [place], but would like to p/up [employer] or Fri if possible.

## 2014-07-09 ENCOUNTER — Encounter: Payer: Self-pay | Admitting: Family Medicine

## 2014-07-09 ENCOUNTER — Telehealth: Payer: Self-pay | Admitting: *Deleted

## 2014-07-09 ENCOUNTER — Encounter: Payer: Self-pay | Admitting: *Deleted

## 2014-07-09 NOTE — Telephone Encounter (Signed)
Faxed signed authorization to release medical records from Magda Bernheim MD at [facility]

## 2014-07-09 NOTE — Telephone Encounter (Signed)
Pt p/up Rx.

## 2014-07-28 ENCOUNTER — Telehealth: Payer: Self-pay

## 2014-07-28 ENCOUNTER — Other Ambulatory Visit: Payer: Self-pay | Admitting: Emergency Medicine

## 2014-07-28 DIAGNOSIS — M25512 Pain in left shoulder: Secondary | ICD-10-CM

## 2014-07-28 MED ORDER — HYDROCODONE-ACETAMINOPHEN 7.5-325 MG PO TABS: 1.0000 | ORAL_TABLET | ORAL | Status: DC | PRN

## 2014-07-28 NOTE — Telephone Encounter (Signed)
Pt requesting hydrochodone refill    Best phone for pt is [phone]

## 2014-08-18 ENCOUNTER — Other Ambulatory Visit: Payer: Self-pay | Admitting: Emergency Medicine

## 2014-08-18 ENCOUNTER — Telehealth: Payer: Self-pay

## 2014-08-18 DIAGNOSIS — M25512 Pain in left shoulder: Secondary | ICD-10-CM

## 2014-08-18 MED ORDER — HYDROCODONE-ACETAMINOPHEN 7.5-325 MG PO TABS: 1.0000 | ORAL_TABLET | ORAL | Status: DC | PRN

## 2014-08-18 NOTE — Telephone Encounter (Signed)
Pt notified that rx is ready for pickup at [facility]

## 2014-08-18 NOTE — Telephone Encounter (Signed)
Pt requesting hydrocodeine refill   Best phone [phone]

## 2014-08-30 ENCOUNTER — Ambulatory Visit (INDEPENDENT_AMBULATORY_CARE_PROVIDER_SITE_OTHER): Payer: BLUE CROSS/BLUE SHIELD | Admitting: Emergency Medicine

## 2014-08-30 VITALS — BP 120/80 | HR 95 | Temp 97.9°F | Resp 12 | Ht 72.0 in | Wt 197.2 lb

## 2014-08-30 DIAGNOSIS — C641 Malignant neoplasm of right kidney, except renal pelvis: Secondary | ICD-10-CM

## 2014-08-30 DIAGNOSIS — M25512 Pain in left shoulder: Secondary | ICD-10-CM

## 2014-08-30 MED ORDER — HYDROCODONE-ACETAMINOPHEN 7.5-325 MG PO TABS: 1.0000 | ORAL_TABLET | ORAL | Status: DC | PRN

## 2014-08-30 NOTE — Progress Notes (Signed)
   Subjective:    Patient ID: Douglas [name], male    DOB: 1955-08-09, 59 y.o.   MRN: [id number] This chart was scribed for Douglas Queen, MD by [name], Medical Scribe. This patient was seen in room 2 and the patient's care was started at 10:43 AM.   HPI HPI Comments: Douglas [name] is a 59 y.o. male with a hx of renal cell cancer who presents to the [facility] for a follow-up for renal cell cancer. Patient carries the [place] mutation. He had surgery for renal cancer. He developed metastatic disease and had follow-up surgery. He is now under surveillance through the [facility]. Recent scans showed progression of his metastatic disease. [facility] to [facility] his oncologist, Dr. Derrel Nip, last week and did not get good news. The lesion in the abdomen found in August via MRI had decreased in size in November, but was found to be increased in size at the MRI monitoring last week. Another new lesion was also found near his bowels. The oncologists want him to return in 3 months. Patient does have a support group and goes once a month, but he has been more worried and scared lately. He is being considered for some experimental chemotherapy drugs. Surgery is still a consideration for him. He has an appointment with Dr. Tresa Moore, his urologist, next month, April 4th or 5th. He also wants a referral to see Dr. Ruthann Cancer, a urologist in [facility]. Patient is on schedule to have colonoscopies every 5 years; he is wondering if he needs to have them done more often.  Patient also reports having issues with seasonal allergies lately. He is planning to take Zyrtec and Benadryl, but he had been concerned about Benadryl because a study showed connection with Benadryl and Alzheimer's.    Patient has reduced his smoking to about 0.5 ppd. He enjoys watching college basketball and college football.   Review of Systems  Allergic/Immunologic: Positive for environmental allergies.       Objective:   Physical Exam CONSTITUTIONAL: Well developed/well nourished HEAD: Normocephalic/atraumatic EYES: EOM/PERRL ENMT: Mucous membranes moist NECK: supple no meningeal signs SPINE: entire spine nontender CV: S1/S2 noted, no murmurs/rubs/gallops noted. RRR. LUNGS: Lungs are clear to auscultation bilaterally, no apparent distress ABDOMEN: Healed scar midline and right upper abdomen. There is tenderness in the RUQ. No masses felt. GU: no cva tenderness NEURO: Pt is awake/alert, moves all extremitiesx4 EXTREMITIES: pulses normal, full ROM SKIN: warm, color normal PSYCH: no abnormalities of mood noted        Assessment & Plan:   Patient depressed over recent reports from [place]. He does have progression of his disease. He is going to explore possible investigational drug therapies. There is a possibility of surgery in the future. I did refill his hydrocodone that he can get on Friday. He is going to be involved with more support groups and consider counseling.I personally performed the services described in this documentation, which was scribed in my presence. The recorded information has been reviewed and is accurate.

## 2014-08-31 ENCOUNTER — Other Ambulatory Visit: Payer: Self-pay | Admitting: Emergency Medicine

## 2014-09-22 ENCOUNTER — Encounter: Payer: Self-pay | Admitting: Emergency Medicine

## 2014-09-22 ENCOUNTER — Ambulatory Visit (INDEPENDENT_AMBULATORY_CARE_PROVIDER_SITE_OTHER): Payer: BLUE CROSS/BLUE SHIELD | Admitting: Emergency Medicine

## 2014-09-22 VITALS — BP 112/74 | HR 77 | Temp 98.1°F | Resp 18 | Ht 72.5 in | Wt 199.0 lb

## 2014-09-22 DIAGNOSIS — M25512 Pain in left shoulder: Secondary | ICD-10-CM | POA: Diagnosis not present

## 2014-09-22 DIAGNOSIS — I10 Essential (primary) hypertension: Secondary | ICD-10-CM

## 2014-09-22 DIAGNOSIS — Z23 Encounter for immunization: Secondary | ICD-10-CM

## 2014-09-22 DIAGNOSIS — Z0184 Encounter for antibody response examination: Secondary | ICD-10-CM

## 2014-09-22 MED ORDER — HYDROCODONE-ACETAMINOPHEN 7.5-325 MG PO TABS: ORAL_TABLET | ORAL | Status: DC

## 2014-09-22 MED ORDER — ZOSTER VACCINE LIVE 19400 UNT/0.65ML ~~LOC~~ SOLR: 0.6500 mL | Freq: Once | SUBCUTANEOUS | Status: DC

## 2014-09-22 NOTE — Patient Instructions (Signed)
Pneumococcal Conjugate Vaccine: What You Need to Know  Your doctor recommends that you, or your child, get a dose of PCV13 today.  1. Why get vaccinated?  Pneumococcal conjugate vaccine (called PCV13 or Prevnar 13) is recommended to protect infants and toddlers, and some older children and adults with certain health conditions, from pneumococcal disease.  Pneumococcal disease is caused by infection with Streptococcus pneumoniae bacteria. These bacteria can spread from person to person through close contact.  Pneumococcal disease can lead to severe health problems, including pneumonia, blood infections, and meningitis.  Meningitis is an infection of the covering of the brain. Pneumococcal meningitis is fairly rare (less than 1 case per 100,000 people each year), but it leads to other health problems, including deafness and brain damage. In children, it is fatal in about 1 case out of 10.  Children younger than two are at higher risk for serious disease than older children.  People with certain medical conditions, people over age 65, and cigarette smokers are also at higher risk.  Before vaccine, pneumococcal infections caused many problems each year in the [place] in children younger than 5, including:  · more than 700 cases of meningitis,  · 13,000 blood infections,  · about 5 million ear infections, and  · about 200 deaths.  About 4,000 adults still die each year because of pneumococcal infections.  Pneumococcal infections can be hard to treat because some strains are resistant to antibiotics. This makes prevention through vaccination even more important.  2. PCV13 vaccine  There are more than 90 types of pneumococcal bacteria. PCV13 protects against 13 of them. These 13 strains cause most severe infections in children and about half of infections in adults.   PCV13 is routinely given to children at 2, 4, 6, and 12-15 months of age. Children in this age range are at greatest risk for serious diseases caused  by pneumococcal infection.  PCV13 vaccine may also be recommended for some older children or adults. Your doctor can give you details.  A second type of pneumococcal vaccine, called PPSV23, may also be given to some children and adults, including anyone over age 65. There is a separate Vaccine Information Statement for this vaccine.  3. Precautions   Anyone who has ever had a life-threatening allergic reaction to a dose of this vaccine, to an earlier pneumococcal vaccine called PCV7 (or Prevnar), or to any vaccine containing diphtheria toxoid (for example, DTaP), should not get PCV13.  Anyone with a severe allergy to any component of PCV13 should not get the vaccine. Tell your doctor if the person being vaccinated has any severe allergies.  If the person scheduled for vaccination is sick, your doctor might decide to reschedule the shot on another day.  Your doctor can give you more information about any of these precautions.  4. What are the risks of PCV13 vaccine?   With any medicine, including vaccines, there is a chance of side effects. These are usually mild and go away on their own, but serious reactions are also possible.  Reported problems associated with PCV13 vary by dose and age, but generally:  · About half of children became drowsy after the shot, had a temporary loss of appetite, or had redness or tenderness where the shot was given.  · About 1 out of 3 had swelling where the shot was given.  · About 1 out of 3 had a mild fever, and about 1 in 20 had a higher fever (over 102.2°F).  ·   Up to about 8 out of 10 became fussy or irritable.  Adults receiving the vaccine have reported redness, pain, and swelling where the shot was given. Mild fever, fatigue, headache, chills, or muscle pain have also been reported.  Life-threatening allergic reactions from any vaccine are very rare.  5. What if there is a serious reaction?  What should I look for?  · Look for anything that concerns you, such as signs of a  severe allergic reaction, very high fever, or behavior changes.  Signs of a severe allergic reaction can include hives, swelling of the face and throat, difficulty breathing, a fast heartbeat, dizziness, and weakness. These would start a few minutes to a few hours after the vaccination.  What should I do?  · If you think it is a severe allergic reaction or other emergency that can't wait, call 9-1-1 or get the person to the nearest hospital. Otherwise, call your doctor.  · Afterward, the reaction should be reported to the Vaccine Adverse Event Reporting System (VAERS). Your doctor might file this report, or you can do it yourself through the VAERS web site at www.vaers.hhs.gov, or by calling 1-800-822-7967.  VAERS is only for reporting reactions. They do not give medical advice.  6. The National Vaccine Injury Compensation Program  The National Vaccine Injury Compensation Program (VICP) is a federal program that was created to compensate people who may have been injured by certain vaccines.  Persons who believe they may have been injured by a vaccine can learn about the program and about filing a claim by calling 1-800-338-2382 or visiting the VICP website at www.hrsa.gov/vaccinecompensation.  7. How can I learn more?  · Ask your doctor.  · Call your local or state health department.  · Contact the Centers for Disease Control and Prevention (CDC):  ¨ Call 1-800-232-4636 (1-800-CDC-INFO) or  ¨ Visit CDC's website at www.cdc.gov/vaccines  CDC PCV13 Vaccine VIS (Interim) (08/16/11)  Document Released: 04/02/2006 Document Revised: 10/20/2013 Document Reviewed: 07/25/2013  ExitCare® Patient Information ©2015 [employer]. This information is not intended to replace advice given to you by your health care provider. Make sure you discuss any questions you have with your health care provider.

## 2014-09-22 NOTE — Progress Notes (Addendum)
Subjective:  This chart was scribe for Douglas Russian, MD by Douglas [name], ED Scribe. The patient was seen in Room 22 and the patient's care was started at 10:44 AM.   Patient ID: Douglas [name], male    DOB: 02/20/1956, 59 y.o.   MRN: [id number] Chief Complaint  Patient presents with  . Follow-up  . Hypertension    HPI HPI Comments: Douglas [name] is a 59 y.o. male with a hx of HTN and Metastatic Cancer who presents to the [facility] for a follow up for HTN. He reports that he was told that he has two options which is surgery or an experimental drugs and timing is critical. He states that Dr. Zettie [name] recommendation was to have surgery but he feels as though that he is being pushed towards the experimental drugs route. He reports that he was told that his cancer was terminal and speeding things up. He states that he has an appointment for another scan on the week of June 27,2016. He reports that he has been busy so he is unable to visit his group but he adds that they meet next week. He reports that he has been trying to cut back on his smoking as he has gone from one pack a day to about 5-8 cigarettes a day. He denies receiving the Prevnar vaccine or the shingles vaccine. He reports that he is UTD on his colonoscopy. He reports that he has 3 BM a day, one within 10 minutes of waking up and the BM become more loose as the day goes by.   Past Medical History  Diagnosis Date  . Renal calculi   . Compression fracture     S/P L-SPINE; pt does not recall this hx on 10/16/2013  . GERD (gastroesophageal reflux disease)   . History of surgical fusion joint     DDD C-SPINE  . Hemoptysis   . Allergic rhinitis   . Hyperlipidemia   . Encounter for long-term (current) use of other medications   . COPD (chronic obstructive pulmonary disease)     FVC .66  . Tobacco use disorder   . Melanoma in situ   . Unspecified vitamin D deficiency   . Leiomyoma OF SKIN    INCREASED  RISK RENAL CELL CA  NEEDS CT OF KIDNEYS EVERY 2 YEARS NEXT DUE  02/06/13  . Hypertension   . Neuromuscular disorder   . History of blood transfusion 09/2013    "think so; not sure; related to big OR"  . Osteoarthritis   . Arthritis     "all over my body" (10/16/2013)  . Chronic back pain     "neck to lower back" (10/16/2013)  . Depression     "situational since 09/22/2013 OR"  . Melanoma of back   . 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 Renal cell carcinoma     type 2; papillary   Past Surgical History  Procedure Laterality Date  . Anterior cruciate ligament repair Bilateral [id number]    [phone]  . Anterior cervical decomp/discectomy fusion  1992  . Hand ligament reconstruction Right   . Robotic assited partial nephrectomy  05/22/2012    Procedure: ROBOTIC ASSITED PARTIAL NEPHRECTOMY;  Surgeon: Alexis Frock, MD;  Location: [facility];  Service: Urology;  Laterality: Right;  Right Robotic Cyst Decortication and Partial Nephrectomy   . Spine surgery    . Right colectomy Right 09/22/2013  . Cholecystectomy  09/22/2013    "had  tumors in it"  . Appendectomy  09/22/2013  . Removal of fascia overlying the right psoas Right 09/22/2013  . Partial nephrectomy Right 09/22/2013  . Renal artery stent Right 09/22/2013  . Lymph node dissection Right 09/22/2013    "in the area of the kidney; they were clean"  . Harvest bone graft Right 1992    "hip; for neck fusion"  . Inguinal hernia repair Right 2004  . Ligament repair Right ~ 2001    "little finger"  . Melanoma excision  ~ 2007    "off my back"   Allergies  Allergen Reactions  . Avelox [Moxifloxacin Hcl In Nacl] Hives, Shortness Of Breath and Swelling  . Moxifloxacin Hives     Review of Systems  Constitutional: Negative for fever and chills.  HENT: Negative for congestion.   Respiratory: Negative for shortness of breath and wheezing.   Cardiovascular: Negative for chest pain.  Gastrointestinal: Positive for abdominal  pain (Pt stated "my gut hurts"). Negative for nausea, vomiting and diarrhea.  Musculoskeletal: Negative for myalgias and arthralgias.  Skin: Negative for rash.  Neurological: Negative for dizziness, light-headedness and headaches.       Objective:   Physical Exam  Vitals reviewed.  CONSTITUTIONAL: Well developed/well nourished HEAD: Normocephalic/atraumatic EYES: EOMI/PERRL ENMT: Mucous membranes moist NECK: supple no meningeal signs SPINE/BACK:entire spine nontender CV: S1/S2 noted, no murmurs/rubs/gallops noted LUNGS: Lungs are clear to auscultation bilaterally, no apparent distress. Scattered rhonchi.  ABDOMEN: soft, nontender, no rebound or guarding, bowel sounds noted throughout abdomen. Large central scar which extends through the R side. Area inferior to scar it tender to touch, unchanged from previous.  GU:no cva tenderness NEURO: Pt is awake/alert/appropriate, moves all extremitiesx4.  No facial droop.   EXTREMITIES: pulses normal/equal, full ROM SKIN: warm, color normal PSYCH: no abnormalities of mood noted, alert and oriented to situation        Assessment & Plan:  Patient will be given Prevnar today he has had the pneumovax 23 in the past. He has COPD as well as metastatic renal cancer. He continues to smoke. He will check with his physicians at [facility] and see if the shingles vaccine can be given. We'll check an IgG level for varicella.I personally performed the services described in this documentation, which was scribed in my presence. The recorded information has been reviewed and is accurate.

## 2014-09-23 LAB — VARICELLA ZOSTER ANTIBODY, IGG: VARICELLA IGG: 1787 {index} — AB (ref <135.00)

## 2014-09-29 ENCOUNTER — Telehealth: Payer: Self-pay | Admitting: Emergency Medicine

## 2014-09-29 NOTE — Telephone Encounter (Signed)
Patient request a refill on Losartan 50 MG and Atorvastatin 10 MG. Patient request for a 90 day supply. Pharmacy [facility] [phone]

## 2014-09-29 NOTE — Telephone Encounter (Signed)
Okay to call in 90 day prescriptions as requested.

## 2014-09-30 ENCOUNTER — Other Ambulatory Visit: Payer: Self-pay | Admitting: Radiology

## 2014-09-30 MED ORDER — ATORVASTATIN CALCIUM 10 MG PO TABS: ORAL_TABLET | ORAL | Status: DC

## 2014-09-30 MED ORDER — LOSARTAN POTASSIUM 50 MG PO TABS: ORAL_TABLET | ORAL | Status: DC

## 2014-09-30 NOTE — Telephone Encounter (Signed)
Will send in rx's

## 2014-10-12 ENCOUNTER — Telehealth: Payer: Self-pay

## 2014-10-12 NOTE — Telephone Encounter (Signed)
Patient requesting a refill on his "Hydrocodone 7.5/325". Please call patient at [phone] when ready to be picked up.

## 2014-10-13 ENCOUNTER — Other Ambulatory Visit: Payer: Self-pay | Admitting: Emergency Medicine

## 2014-10-13 ENCOUNTER — Telehealth: Payer: Self-pay

## 2014-10-13 DIAGNOSIS — M25512 Pain in left shoulder: Secondary | ICD-10-CM

## 2014-10-13 MED ORDER — HYDROCODONE-ACETAMINOPHEN 7.5-325 MG PO TABS: ORAL_TABLET | ORAL | Status: DC

## 2014-10-13 NOTE — Telephone Encounter (Signed)
Spoke to patient and advised him that his RX is ready to be picked up at [facility] app[facility] center.  He said he will either get it today or Thursday.

## 2014-11-02 ENCOUNTER — Telehealth: Payer: Self-pay

## 2014-11-02 ENCOUNTER — Other Ambulatory Visit: Payer: Self-pay | Admitting: Emergency Medicine

## 2014-11-02 DIAGNOSIS — M25512 Pain in left shoulder: Secondary | ICD-10-CM

## 2014-11-02 MED ORDER — HYDROCODONE-ACETAMINOPHEN 7.5-325 MG PO TABS: ORAL_TABLET | ORAL | Status: DC

## 2014-11-02 NOTE — Telephone Encounter (Signed)
Pt is requesting a refill of hydrocodone.

## 2014-11-18 ENCOUNTER — Ambulatory Visit (INDEPENDENT_AMBULATORY_CARE_PROVIDER_SITE_OTHER): Payer: BLUE CROSS/BLUE SHIELD | Admitting: Emergency Medicine

## 2014-11-18 VITALS — BP 108/70 | HR 65 | Temp 98.0°F | Resp 18 | Ht 73.25 in | Wt 201.0 lb

## 2014-11-18 DIAGNOSIS — C641 Malignant neoplasm of right kidney, except renal pelvis: Secondary | ICD-10-CM | POA: Diagnosis not present

## 2014-11-18 DIAGNOSIS — M25512 Pain in left shoulder: Secondary | ICD-10-CM

## 2014-11-18 DIAGNOSIS — R1011 Right upper quadrant pain: Secondary | ICD-10-CM | POA: Diagnosis not present

## 2014-11-18 DIAGNOSIS — Z720 Tobacco use: Secondary | ICD-10-CM | POA: Diagnosis not present

## 2014-11-18 DIAGNOSIS — F17200 Nicotine dependence, unspecified, uncomplicated: Secondary | ICD-10-CM

## 2014-11-18 DIAGNOSIS — I10 Essential (primary) hypertension: Secondary | ICD-10-CM

## 2014-11-18 MED ORDER — HYDROCODONE-ACETAMINOPHEN 7.5-325 MG PO TABS: 1.0000 | ORAL_TABLET | Freq: Three times a day (TID) | ORAL | Status: DC | PRN

## 2014-11-18 NOTE — Progress Notes (Addendum)
Subjective:    Patient ID: Douglas [name], male    DOB: Nov 27, 1955, 59 y.o.   MRN: [id number] This chart was scribed for [name], MD by Martinique Peace, ED Scribe. The patient was seen in Room/bed info not found. The patient's care was started at 11:34 AM.  Chief Complaint  Patient presents with  . Follow-up    cancer  . paperwork completed     HPI  HPI Comments: Douglas [name] is a 59 y.o. male with history of HLRCC presents to the [facility] seeking follow-up regarding renal cell cancer that has now spread throughout his abdomen. He reports that he was seen by a Cancer Doctor at [facility] who told him that the "worst" tumor he has is on his hepatoportal and he basically has a 0% chance for a cure. He also expresses that he doesn't want anything to be missed on his scans and would like to have his images sent to [place] for a second reading. Other than this, pt states that he is actually doing "great". He adds that his quality of life is the best that it has been in the past 15 months, his pain is being managed great, and he has been able to do some activities that he wants, giving him satisfaction on accomplishing them. Pt is current everyday smoker. History of Hypertension.    Past Medical History  Diagnosis Date  . Renal calculi   . Compression fracture     S/P L-SPINE; pt does not recall this hx on 10/16/2013  . GERD (gastroesophageal reflux disease)   . History of surgical fusion joint     DDD C-SPINE  . Hemoptysis   . Allergic rhinitis   . Hyperlipidemia   . Encounter for long-term (current) use of other medications   . COPD (chronic obstructive pulmonary disease)     FVC .66  . Tobacco use disorder   . Melanoma in situ   . Unspecified vitamin D deficiency   . Leiomyoma OF SKIN    INCREASED RISK RENAL CELL CA  NEEDS CT OF KIDNEYS EVERY 2 YEARS NEXT DUE  02/06/13  . Hypertension   . Neuromuscular disorder   . History of blood transfusion 09/2013    "think so; not  sure; related to big OR"  . Osteoarthritis   . Arthritis     "all over my body" (10/16/2013)  . Chronic back pain     "neck to lower back" (10/16/2013)  . Depression     "situational since 09/22/2013 OR"  . Melanoma of back   . 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 Renal cell carcinoma     type 2; papillary   Past Surgical History  Procedure Laterality Date  . Anterior cruciate ligament repair Bilateral [id number]    [phone]  . Anterior cervical decomp/discectomy fusion  1992  . Hand ligament reconstruction Right   . Robotic assited partial nephrectomy  05/22/2012    Procedure: ROBOTIC ASSITED PARTIAL NEPHRECTOMY;  Surgeon: Alexis Frock, MD;  Location: [facility];  Service: Urology;  Laterality: Right;  Right Robotic Cyst Decortication and Partial Nephrectomy   . Spine surgery    . Right colectomy Right 09/22/2013  . Cholecystectomy  09/22/2013    "had tumors in it"  . Appendectomy  09/22/2013  . Removal of fascia overlying the right psoas Right 09/22/2013  . Partial nephrectomy Right 09/22/2013  . Renal artery stent Right 09/22/2013  . Lymph node dissection Right 09/22/2013    "  in the area of the kidney; they were clean"  . Harvest bone graft Right 1992    "hip; for neck fusion"  . Inguinal hernia repair Right 2004  . Ligament repair Right ~ 2001    "little finger"  . Melanoma excision  ~ 2007    "off my back"   Allergies  Allergen Reactions  . Avelox [Moxifloxacin Hcl In Nacl] Hives, Shortness Of Breath and Swelling  . Moxifloxacin Hives   Current Outpatient Prescriptions on File Prior to Visit  Medication Sig Dispense Refill  . atorvastatin (LIPITOR) 10 MG tablet TAKE ONE BY MOUTH AT BEDTIME 30 tablet 3  . B Complex-C (B-COMPLEX WITH VITAMIN C) tablet Take 1 tablet by mouth daily.    . cetirizine (ZYRTEC) 10 MG tablet Take 10 mg by mouth daily.    . cholecalciferol (VITAMIN D) 1000 UNITS tablet Take 1,000 Units by mouth daily.    . fluticasone (FLONASE) 50  MCG/ACT nasal spray Place 1 spray into both nostrils at bedtime.    Marland Kitchen HYDROcodone-acetaminophen (NORCO) 7.5-325 MG per tablet 1 tablet every 8 hours 60 tablet 0  . LORazepam (ATIVAN) 1 MG tablet Take one tablet approximately one hour prior to your scan 5 tablet 0  . losartan (COZAAR) 50 MG tablet TAKE ONE BY MOUTH DAILY BEFORE BREAKFAST. 30 tablet 3  . Multiple Vitamin (MULTIVITAMIN WITH MINERALS) TABS tablet Take 1 tablet by mouth daily.    . nicotine polacrilex (NICORETTE) 4 MG gum Take 4 mg by mouth as needed for smoking cessation.    Marland Kitchen omeprazole (PRILOSEC OTC) 20 MG tablet Take 20 mg by mouth daily.    . vitamin C (ASCORBIC ACID) 500 MG tablet Take 500 mg by mouth daily.    Marland Kitchen zoster vaccine live, PF, (ZOSTAVAX) 99371 UNT/0.65ML injection Inject 19,400 Units into the skin once. 1 each 0   No current facility-administered medications on file prior to visit.       Review of Systems  Constitutional: Negative for activity change.  Gastrointestinal: Positive for abdominal pain.  Allergic/Immunologic: Positive for immunocompromised state.       Objective:   Physical Exam  Constitutional: He is oriented to person, place, and time. He appears well-developed and well-nourished. No distress.  HENT:  Head: Normocephalic and atraumatic.  Eyes: Conjunctivae and EOM are normal.  Neck: Neck supple. No tracheal deviation present.  Cardiovascular: Normal rate, regular rhythm and normal heart sounds.  Exam reveals no gallop and no friction rub.   No murmur heard. Pulmonary/Chest: Effort normal and breath sounds normal. No respiratory distress. He has no wheezes. He has no rales.  Abdominal: Soft. There is tenderness.  Scar that extends from midline to umbilicus to right lower abdomen. Significant tenderness to this area.   Musculoskeletal: Normal range of motion.  Neurological: He is alert and oriented to person, place, and time.  Skin: Skin is warm and dry.  Psychiatric: He has a normal mood  and affect. His behavior is normal.  Nursing note and vitals reviewed.    Filed Vitals:   11/18/14 0952  BP: 108/70  Pulse: 65  Temp: 98 F (36.7 C)  Resp: 18     11:51 AM- Treatment plan was discussed with patient who verbalizes understanding and agrees.        Assessment & Plan:  1. Renal cancer, right Patient due to gloved and I H June 1. I suspect he will start on a chemotherapy program at that time. He did see Dr. Nevada Crane  in [facility] who stated he should not undergo any further surgery  2. Pain in joint, shoulder region, left  - HYDROcodone-acetaminophen (NORCO) 7.5-325 MG per tablet; Take 1 tablet by mouth 3 (three) times daily as needed for moderate pain. 1 tablet every 8 hours  Dispense: 60 tablet; Refill: 0  3. Essential hypertension Blood pressures echo.  4. Abdominal pain, right upper quadrant He has persistent pain in the right upper quadrant I suspect this is related to his cancer. Currently this is controlled with hydrocodone.  5. Tobacco use disorder He continues to smoke and at present not currently able to cut back due to the stress he is under with both his cancer and his wife's bipolar disorder   I personally performed the services described in this documentation, which was scribed in my presence. The recorded information has been reviewed and is accurate.  [name], MD  [facility]  11/18/2014 2:44 PM

## 2014-11-20 ENCOUNTER — Telehealth: Payer: Self-pay | Admitting: Emergency Medicine

## 2014-11-20 NOTE — Telephone Encounter (Addendum)
Patient dropped off disability/FMLA forms during his OV on 11/18/2014. Filled in as much as possible, following last year's paperwork. However, some information may need to be updated by Dr. Everlene Farrier. I am returning paperwork to your box for review, completion and signature. Please return to disability department and I can call patient or fax documents. I have scanned a blank copy under release ID # [id number].    Thanks, [employer]

## 2014-11-23 NOTE — Telephone Encounter (Signed)
Pt called checking on status of this message, Please advise

## 2014-11-24 NOTE — Telephone Encounter (Signed)
Douglas [name], can you please let pt know status? Thanks!

## 2014-11-27 NOTE — Telephone Encounter (Signed)
Patient's wife picked up papers

## 2014-12-08 ENCOUNTER — Other Ambulatory Visit: Payer: Self-pay | Admitting: Emergency Medicine

## 2014-12-08 DIAGNOSIS — M25512 Pain in left shoulder: Secondary | ICD-10-CM

## 2014-12-08 MED ORDER — HYDROCODONE-ACETAMINOPHEN 7.5-325 MG PO TABS: 1.0000 | ORAL_TABLET | Freq: Three times a day (TID) | ORAL | Status: DC | PRN

## 2014-12-29 ENCOUNTER — Encounter: Payer: Self-pay | Admitting: Emergency Medicine

## 2014-12-29 ENCOUNTER — Ambulatory Visit (INDEPENDENT_AMBULATORY_CARE_PROVIDER_SITE_OTHER): Payer: BLUE CROSS/BLUE SHIELD | Admitting: Emergency Medicine

## 2014-12-29 VITALS — BP 112/76 | HR 69 | Temp 98.5°F | Resp 16 | Ht 74.0 in | Wt 200.0 lb

## 2014-12-29 DIAGNOSIS — M25512 Pain in left shoulder: Secondary | ICD-10-CM

## 2014-12-29 MED ORDER — HYDROCODONE-ACETAMINOPHEN 7.5-325 MG PO TABS: ORAL_TABLET | ORAL | Status: DC

## 2014-12-29 NOTE — Progress Notes (Signed)
   Subjective:  This chart was scribed for Douglas Queen, MD by Leandra Kern, Medical Scribe. This patient was seen in Room 21 and the patient's care was started at 4:46 PM.   Patient ID: Douglas [name], male    DOB: May 14, 1956, 59 y.o.   MRN: [id number]  HPI HPI Comments: Douglas [name] is a 59 y.o. male with a medical history of HLRCC who presents to [facility] for a medical consultation regarding renal cell cancer that has now spread throughout his abdomen. Pt is considering having a surgery. Pt notes that he currently has four masses: One spot above the kidney, and the radiologist believes that the mass might be in the liver; another spot on the kidney itself; another spot is in the mid of the abdomen; and the last spot is in his colon. Pt reports that the drugs have stopped working after 2-3 years. Therefore, he believes that the surgery route might be a better option for him. Pt states that the doctors are planning not to use stents as a result of his previous infections, they will not use glue for the site of incision due to the complications from last time. Pt does not have a surgical date set up yet. Pt has a good attitude about the surgery.   Pt reports that he has been going to his support group once a month, his quality of life is good. He notes that he walks about a mile every day in the morning, and exercises his upper body just to get his condition back to normal state before his previous surgery.  Pt is requesting a medication refill.  Pt still fishes for pleasure.    Review of Systems     Objective:   Physical Exam  Constitutional: He is oriented to person, place, and time. He appears well-developed and well-nourished. No distress.  HENT:  Head: Normocephalic and atraumatic.  Eyes: EOM are normal. Pupils are equal, round, and reactive to light.  Neck: Neck supple.  Cardiovascular: Normal rate.   Pulmonary/Chest: Effort normal.  Neurological: He is  alert and oriented to person, place, and time. No cranial nerve deficit.  Skin: Skin is warm and dry.  Psychiatric: He has a normal mood and affect. His behavior is normal.  Nursing note and vitals reviewed. ABD there is a healed scar over the abdomen with a large 6 by since injury area of right lower quadrant tenderness similar to before    Assessment & Plan:  Patient has been [facility]. He has 4 areas which need to be removed. He has decided he wants to proceed with surgery again. He is under the care of Dr. Loleta Dicker  at [facility]. He will continue his care here with Dr. Harrell Lark. His hydrocodone was refilled for his chronic neck and shoulder pain.I personally performed the services described in this documentation, which was scribed in my presence. The recorded information has been reviewed and is accurate.  Nena Jordan, MD

## 2014-12-29 NOTE — Progress Notes (Signed)
° °  Subjective:  This chart was scribed for [name], MD by Leandra Kern, Medical Scribe. This patient was seen in Room 21 and the patient's care was started at 4:46 PM.   Patient ID: Douglas [name], male    DOB: July 18, 1955, 59 y.o.   MRN: [id number]  HPI HPI Comments: Douglas [name] is a 59 y.o. male with a medical history of HLRCC who presents to [facility] for a medical consultation regarding renal cell cancer that has now spread throughout his abdomen. Pt is considering having a surgery. Pt notes that he currently has four masses: One spot above the kidney, and the radiologist believes that the mass might be in the liver; another spot on the kidney itself; another spot is in the mid of the abdomen; and the last spot is in his colon. Pt reports that the drugs have stopped working after 2-3 years. Therefore, he believes that the surgery route might be a better option for him. Pt states that the doctors are planning not to use stents as a result of his previous infections, they will not use glue for the site of incision due to the complications from last time. Pt does not have a surgical date set up yet. Pt has a good attitude about the surgery.   Pt reports that he has been going to his support group once a month, his quality of life is good. He notes that he walks about a mile every day in the morning, and exercises his upper body just to get his condition back to normal state before his previous surgery.  Pt is requesting a medication refill.  Pt still fishes for pleasure.    Review of Systems     Objective:   Physical Exam  Constitutional: He is oriented to person, place, and time. He appears well-developed and well-nourished. No distress.  HENT:  Head: Normocephalic and atraumatic.  Eyes: EOM are normal. Pupils are equal, round, and reactive to light.  Neck: Neck supple.  Cardiovascular: Normal rate.   Pulmonary/Chest: Effort normal.  Neurological: He is  alert and oriented to person, place, and time. No cranial nerve deficit.  Skin: Skin is warm and dry.  Psychiatric: He has a normal mood and affect. His behavior is normal.  Nursing note and vitals reviewed. ABD there is a healed scar over the abdomen with a large 6 by since injury area of right lower quadrant tenderness similar to before    Assessment & Plan:  Patient has been [facility]. He has 4 areas which need to be removed. He has decided he wants to proceed with surgery again. He is under the care of Dr. Loleta Dicker  at [facility]. He will continue his care here with Dr. Harrell Lark. His hydrocodone was refilled for his chronic neck and shoulder pain.I personally performed the services described in this documentation, which was scribed in my presence. The recorded information has been reviewed and is accurate.  Nena Jordan, MD

## 2015-01-12 ENCOUNTER — Ambulatory Visit (INDEPENDENT_AMBULATORY_CARE_PROVIDER_SITE_OTHER): Payer: BLUE CROSS/BLUE SHIELD | Admitting: Emergency Medicine

## 2015-01-12 ENCOUNTER — Encounter: Payer: Self-pay | Admitting: Internal Medicine

## 2015-01-12 ENCOUNTER — Encounter: Payer: Self-pay | Admitting: Emergency Medicine

## 2015-01-12 VITALS — BP 114/76 | HR 66 | Temp 97.6°F | Resp 16 | Ht 74.0 in | Wt 195.0 lb

## 2015-01-12 DIAGNOSIS — M25512 Pain in left shoulder: Secondary | ICD-10-CM

## 2015-01-12 DIAGNOSIS — D481 Neoplasm of uncertain behavior of connective and other soft tissue: Secondary | ICD-10-CM | POA: Diagnosis not present

## 2015-01-12 DIAGNOSIS — J302 Other seasonal allergic rhinitis: Secondary | ICD-10-CM | POA: Diagnosis not present

## 2015-01-12 DIAGNOSIS — I10 Essential (primary) hypertension: Secondary | ICD-10-CM | POA: Diagnosis not present

## 2015-01-12 DIAGNOSIS — C641 Malignant neoplasm of right kidney, except renal pelvis: Secondary | ICD-10-CM | POA: Diagnosis not present

## 2015-01-12 MED ORDER — HYDROCODONE-ACETAMINOPHEN 7.5-325 MG PO TABS: ORAL_TABLET | ORAL | Status: DC

## 2015-01-12 NOTE — Progress Notes (Deleted)
Subjective:    Patient ID: Douglas [name], male    DOB: Nov 23, 1955, 59 y.o.   MRN: [id number]  HPI  Pt with PMH of HLRCC, managed by [facility] presents for a follow-up appointment. In 2014/2015 the pt had two R partial nephrectomies, a cholecystectomy, appendectomy, partial colectomy of the ascending colon, and R lymph node dissection.  Pt got back from a week-long hiking trip on Saturday and states that he has been doing well. Pt states that he saw his local surgeon yesterday and agrees with [facility] treatment plan to perform another surgery to remove 4 tumors - one near a previous surgical site on the ascending colon, one near transverse colon, one near sigmoid colon, and one above his R kidney. Pt will also receive his third partial nephrectomy during this procedure. Pt states that the procedure is supposed to be scheduled for October 17th, which makes him nervous for his risk of metastasis in the next two months. Pt reports nervousness about potential for colostomy bag.  Pt reports dealing with the stress of his condition is his "new normal" and he is trying to stay positive. He prays a lot and has faith in his doctors. Pt states that despite his wife's health complications, she is supportive and helpful. He did express some stress related to scheduling his upcoming surgery.  Pt states his energy level is good, he goes fishing every Sunday. He is happy with his quality of life. He reports a deep pain in the RLQ and his left shoulder. He states that his medication, NORCO TID, controls his pain well and keeps it at a 2-3 out of 10 on the pain scale. Reports that he needs a refill - he runs out of [place] in 5-6 days.   Review of Systems  Constitutional: Negative.   HENT: Positive for congestion and postnasal drip (takes Zyrtec PO daily in PM, and breathrite strip over nose at night. ).   Eyes: Negative.   Respiratory: Negative.   Cardiovascular: Negative.   Gastrointestinal: Positive for abdominal pain.  Negative for nausea and constipation.       Variable stools - sometimes loose, sometimes soft and formed since his partial colectomy. BMs are regular, 2-3 times daily.   Endocrine: Negative.   Genitourinary: Negative.   Skin: Negative.   Neurological: Negative.   Psychiatric/Behavioral: Negative.       Objective:   Physical Exam  Constitutional: He is oriented to person, place, and time. He appears well-developed and well-nourished. No distress.  BP 114/76 mmHg  Pulse 66  Temp(Src) 97.6 F (36.4 C) (Oral)  Resp 16  Ht 6\' 2"  (1.88 m)  Wt 195 lb (88.451 kg)  BMI 25.03 kg/m2  SpO2 95%   HENT:  Head: Normocephalic and atraumatic.  Mouth/Throat: Oropharynx is clear and moist.  Eyes: Conjunctivae and EOM are normal. Right eye exhibits no discharge. Left eye exhibits no discharge. No scleral icterus.  Neck: Normal range of motion. Neck supple. No tracheal deviation present. No thyromegaly present.  Cardiovascular: Normal rate, regular rhythm, normal heart sounds and intact distal pulses.  Exam reveals no gallop and no friction rub.   No murmur heard. Pulmonary/Chest: Effort normal. No respiratory distress. He has no wheezes. He has no rales. He exhibits no tenderness.  Abdominal: Soft. Bowel sounds are normal. He exhibits no distension and no mass. There is tenderness. There is no rebound and no guarding.  Lymphadenopathy:    He has no cervical adenopathy.  Neurological: He is alert and  oriented to person, place, and time.  Skin: Skin is warm and dry. No rash noted. He is not diaphoretic. No erythema. No pallor.  Psychiatric: He has a normal mood and affect. His behavior is normal. Judgment and thought content normal.          Assessment & Plan:   1. Pain in joint, shoulder region, left HYDROcodone-acetaminophen (NORCO) 7.5-325 MG tablet; take 1 tablet q 8h as needed for pain. Disp: 60 tabs, 0 refills 2. 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  Essential hypertension Controlled on current medications Losartan 50 mg PO daily in AM  4. Other seasonal allergic rhinitis Controlled on current medications Zyrtec 10 mg tablet PO daily

## 2015-01-12 NOTE — Progress Notes (Signed)
Subjective:    Patient ID: Douglas [name], male    DOB: 1956-02-13, 59 y.o.   MRN: [id number]  HPI  Pt with PMH of HLRCC, managed by [facility] presents for a follow-up appointment. In 2014/2015 the pt had two R partial nephrectomies, a cholecystectomy, appendectomy, partial colectomy of the ascending colon, and R lymph node dissection.  Pt got back from a week-long hiking trip on Saturday and states that he has been doing well. Pt states that he saw his local surgeon yesterday and agrees with [facility] treatment plan to perform another surgery to remove 4 tumors - one near a previous surgical site on the ascending colon, one near transverse colon, one near sigmoid colon, and one above his R kidney. Pt will also receive his third partial nephrectomy during this procedure. Pt states that the procedure is supposed to be scheduled for October 17th, which makes him nervous for his risk of metastasis in the next two months. Pt reports nervousness about potential for colostomy bag.  Pt reports dealing with the stress of his condition is his "new normal" and he is trying to stay positive. He prays a lot and has faith in his doctors. Pt states that despite his wife's health complications, she is supportive and helpful. He did express some stress related to scheduling his upcoming surgery.  Pt states his energy level is good, he goes fishing every Sunday. He is happy with his quality of life. He reports a deep pain in the RLQ and his left shoulder. He states that his medication, NORCO TID, controls his pain well and keeps it at a 2-3 out of 10 on the pain scale. Reports that he needs a refill - he runs out of [place] in 5-6 days.   Review of Systems  Constitutional: Negative.   HENT: Positive for congestion and postnasal drip (takes Zyrtec PO daily in PM, and breathrite strip over nose at night. ).   Eyes: Negative.   Respiratory: Negative.   Cardiovascular: Negative.   Gastrointestinal: Positive for abdominal pain.  Negative for nausea and constipation.       Variable stools - sometimes loose, sometimes soft and formed since his partial colectomy. BMs are regular, 2-3 times daily.   Endocrine: Negative.   Genitourinary: Negative.   Skin: Negative.   Neurological: Negative.   Psychiatric/Behavioral: Negative.       Objective:   Physical Exam  Constitutional: He is oriented to person, place, and time. He appears well-developed and well-nourished. No distress.  BP 114/76 mmHg  Pulse 66  Temp(Src) 97.6 F (36.4 C) (Oral)  Resp 16  Ht 6\' 2"  (1.88 m)  Wt 195 lb (88.451 kg)  BMI 25.03 kg/m2  SpO2 95%   HENT:  Head: Normocephalic and atraumatic.  Mouth/Throat: Oropharynx is clear and moist.  Eyes: Conjunctivae and EOM are normal. Right eye exhibits no discharge. Left eye exhibits no discharge. No scleral icterus.  Neck: Normal range of motion. Neck supple. No tracheal deviation present. No thyromegaly present.  Cardiovascular: Normal rate, regular rhythm, normal heart sounds and intact distal pulses.  Exam reveals no gallop and no friction rub.   No murmur heard. Pulmonary/Chest: Effort normal. No respiratory distress. He has no wheezes. He has no rales. He exhibits no tenderness.  Abdominal: Soft. Bowel sounds are normal. He exhibits no distension and no mass. There is tenderness. There is no rebound and no guarding.  Lymphadenopathy:    He has no cervical adenopathy.  Neurological: He is alert and  oriented to person, place, and time.  Skin: Skin is warm and dry. No rash noted. He is not diaphoretic. No erythema. No pallor.  Psychiatric: He has a normal mood and affect. His behavior is normal. Judgment and thought content normal.          Assessment & Plan:   1. Pain in joint, shoulder region, left HYDROcodone-acetaminophen (NORCO) 7.5-325 MG tablet; take 1 tablet q 8h as needed for pain. Disp: 60 tabs, 0 refills 2. 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  Essential hypertension Controlled on current medications Losartan 50 mg PO daily in AM  4. Other seasonal allergic rhinitis Controlled on current medications Zyrtec 10 mg tablet PO daily

## 2015-02-01 ENCOUNTER — Other Ambulatory Visit: Payer: Self-pay

## 2015-02-01 DIAGNOSIS — M25512 Pain in left shoulder: Secondary | ICD-10-CM

## 2015-02-01 MED ORDER — HYDROCODONE-ACETAMINOPHEN 7.5-325 MG PO TABS: ORAL_TABLET | ORAL | Status: DC

## 2015-02-01 NOTE — Telephone Encounter (Signed)
Rx refill request for Hydrocodone.

## 2015-02-17 ENCOUNTER — Other Ambulatory Visit: Payer: Self-pay | Admitting: Emergency Medicine

## 2015-02-18 ENCOUNTER — Telehealth: Payer: Self-pay | Admitting: *Deleted

## 2015-02-18 ENCOUNTER — Other Ambulatory Visit: Payer: Self-pay | Admitting: Emergency Medicine

## 2015-02-18 DIAGNOSIS — M25512 Pain in left shoulder: Secondary | ICD-10-CM

## 2015-02-18 MED ORDER — HYDROCODONE-ACETAMINOPHEN 7.5-325 MG PO TABS: ORAL_TABLET | ORAL | Status: DC

## 2015-02-18 NOTE — Telephone Encounter (Signed)
lmom that rx for Hydrocodone is ready for pickup at [facility].

## 2015-02-25 ENCOUNTER — Ambulatory Visit (INDEPENDENT_AMBULATORY_CARE_PROVIDER_SITE_OTHER): Payer: BLUE CROSS/BLUE SHIELD

## 2015-02-25 DIAGNOSIS — Z23 Encounter for immunization: Secondary | ICD-10-CM

## 2015-03-02 NOTE — Telephone Encounter (Signed)
Spoke with patient.

## 2015-03-09 ENCOUNTER — Telehealth: Payer: Self-pay | Admitting: *Deleted

## 2015-03-09 DIAGNOSIS — M25512 Pain in left shoulder: Secondary | ICD-10-CM

## 2015-03-09 MED ORDER — HYDROCODONE-ACETAMINOPHEN 7.5-325 MG PO TABS: ORAL_TABLET | ORAL | Status: DC

## 2015-03-09 NOTE — Telephone Encounter (Signed)
PT REQUEST RX FOR PAIN MED.  PT CALLED AND NOTIFIED THAT RX IS READY FOR PICKUP

## 2015-03-23 ENCOUNTER — Encounter: Payer: Self-pay | Admitting: Emergency Medicine

## 2015-04-01 ENCOUNTER — Other Ambulatory Visit: Payer: Self-pay | Admitting: Emergency Medicine

## 2015-04-01 MED ORDER — LORAZEPAM 0.5 MG PO TABS: ORAL_TABLET | ORAL | Status: DC

## 2015-04-04 ENCOUNTER — Ambulatory Visit (INDEPENDENT_AMBULATORY_CARE_PROVIDER_SITE_OTHER): Payer: BLUE CROSS/BLUE SHIELD | Admitting: Emergency Medicine

## 2015-04-04 VITALS — BP 118/76 | HR 67 | Temp 98.0°F | Resp 18 | Ht 74.0 in | Wt 193.6 lb

## 2015-04-04 DIAGNOSIS — M25512 Pain in left shoulder: Secondary | ICD-10-CM | POA: Diagnosis not present

## 2015-04-04 MED ORDER — HYDROCODONE-ACETAMINOPHEN 7.5-325 MG PO TABS: ORAL_TABLET | ORAL | Status: DC

## 2015-04-04 NOTE — Progress Notes (Signed)
Patient ID: Douglas [name], male   DOB: Jun 21, 1955, 59 y.o.   MRN: [id number]     This chart was scribed for Douglas Queen, MD by [name], Medical Scribe. This patient was seen in room 10 and the patient's care was started at 10:53 AM.   Chief Complaint:  Chief Complaint  Patient presents with  . Follow-up    to discuss his cancer    HPI: Douglas [name] is a 59 y.o. male with a history of hereditary leiomyomatosis and renal cell cancer, managed by [facility], who reports to [facility] today for a follow-up. In 2014/2015 the pt had two R partial nephrectomies, a cholecystectomy, appendectomy, partial colectomy of the ascending colon, and R lymph node dissection. Patient was supposed to have surgery at the [facility] on 10/4, but this was canceled. He is set to return on 12/5 and is hoping to have the surgery next time. He has been taking the Ativan at times which helps him sleep, but notes that it makes him feel drowsy.   Patient feels like his quality of life is really good right now. He does have some physical limitations, but is able to work around those. He is thinking about retiring from [employer] in March of next year. He feels his support group has been very helpful and would be interested in joining another one.  Patient notes that he sustained a small laceration to his left thumb on a fishhook while he was fishing. He did apply neosporin and does not think the area is infected.  Past Medical History  Diagnosis Date  . Renal calculi   . Compression fracture     S/P L-SPINE; pt does not recall this hx on 10/16/2013  . GERD (gastroesophageal reflux disease)   . History of surgical fusion joint     DDD C-SPINE  . Hemoptysis   . Allergic rhinitis   . Hyperlipidemia   . Encounter for long-term (current) use of other medications   . COPD (chronic obstructive pulmonary disease) (HCC)     FVC .66  . Tobacco use disorder   . Melanoma in situ ([place])   . Unspecified vitamin D deficiency   .  Leiomyoma OF SKIN    INCREASED RISK RENAL CELL CA  NEEDS CT OF KIDNEYS EVERY 2 YEARS NEXT DUE  02/06/13  . Hypertension   . Neuromuscular disorder ([place])   . History of blood transfusion 09/2013    "think so; not sure; related to big OR"  . Osteoarthritis   . Arthritis     "all over my body" (10/16/2013)  . Chronic back pain     "neck to lower back" (10/16/2013)  . Depression     "situational since 09/22/2013 OR"  . Melanoma of back ([place])   . 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) ([place])   . Renal cell carcinoma ([place])     type 2; papillary   Past Surgical History  Procedure Laterality Date  . Anterior cruciate ligament repair Bilateral [id number]    [phone]  . Anterior cervical decomp/discectomy fusion  1992  . Hand ligament reconstruction Right   . Robotic assited partial nephrectomy  05/22/2012    Procedure: ROBOTIC ASSITED PARTIAL NEPHRECTOMY;  Surgeon: Alexis Frock, MD;  Location: [facility];  Service: Urology;  Laterality: Right;  Right Robotic Cyst Decortication and Partial Nephrectomy   . Spine surgery    . Right colectomy Right 09/22/2013  . Cholecystectomy  09/22/2013    "had tumors in it"  .  Appendectomy  09/22/2013  . Removal of fascia overlying the right psoas Right 09/22/2013  . Partial nephrectomy Right 09/22/2013  . Renal artery stent Right 09/22/2013  . Lymph node dissection Right 09/22/2013    "in the area of the kidney; they were clean"  . Harvest bone graft Right 1992    "hip; for neck fusion"  . Inguinal hernia repair Right 2004  . Ligament repair Right ~ 2001    "little finger"  . Melanoma excision  ~ 2007    "off my back"   Social History   Social History  . Marital Status: Married    Spouse Name: N/A  . Number of Children: N/A  . 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 Smoking status: Current Every Day Smoker -- 0.50 packs/day for 40 years    Types: Cigarettes  .  Smokeless tobacco: Never Used     Comment: Pt chewing Nicorette gum currently  . Alcohol Use: 0.0 oz/week    0 Standard drinks or equivalent per week     Comment: 10/16/2013 "2-3 beers daily generally; nothing last month or so"  . Drug Use: No  . Sexual Activity: Not Currently   Other Topics Concern  . None   Social History Narrative   Walks daily for exercise.   Family History  Problem Relation Age of Onset  . Adopted: Yes  . Hypertension Father    Allergies  Allergen Reactions  . Avelox [Moxifloxacin Hcl In Nacl] Hives, Shortness Of Breath and Swelling  . Moxifloxacin Hives   Prior to Admission medications   Medication Sig Start Date End Date Taking? Authorizing Provider  atorvastatin (LIPITOR) 10 MG tablet TAKE ONE BY MOUTH AT BEDTIME 09/30/14  Yes [name], MD  atorvastatin (LIPITOR) 10 MG tablet TAKE 1 BY MOUTH AT BEDTIME 02/18/15  Yes [name], MD  B Complex-C (B-COMPLEX WITH VITAMIN C) tablet Take 1 tablet by mouth daily.   Yes Historical Provider, MD  cetirizine (ZYRTEC) 10 MG tablet Take 10 mg by mouth daily.   Yes Historical Provider, MD  cholecalciferol (VITAMIN D) 1000 UNITS tablet Take 1,000 Units by mouth daily.   Yes Historical Provider, MD  fluticasone (FLONASE) 50 MCG/ACT nasal spray Place 1 spray into both nostrils at bedtime.   Yes Historical Provider, MD  HYDROcodone-acetaminophen (NORCO) 7.5-325 MG per tablet 1 tablet every 8 hours as needed for pain 03/09/15  Yes [name], MD  LORazepam (ATIVAN) 0.5 MG tablet Take 1 tablet twice a day as needed for stress 04/01/15  Yes [name], MD  losartan (COZAAR) 50 MG tablet TAKE ONE BY MOUTH DAILY BEFORE BREAKFAST. 09/30/14  Yes [name], MD  losartan (COZAAR) 50 MG tablet TAKE 1 BY MOUTH DAILY BEFORE BREAKFAST 02/18/15  Yes [name], MD  Multiple Vitamin (MULTIVITAMIN WITH MINERALS) TABS tablet Take 1 tablet by mouth daily.   Yes Historical Provider, MD  nicotine polacrilex (NICORETTE) 4 MG gum  Take 4 mg by mouth as needed for smoking cessation.   Yes Historical Provider, MD  omeprazole (PRILOSEC OTC) 20 MG tablet Take 20 mg by mouth daily.   Yes Historical Provider, MD  vitamin C (ASCORBIC ACID) 500 MG tablet Take 500 mg by mouth daily.   Yes Historical Provider, MD  zoster vaccine live, PF, (ZOSTAVAX) 76160 UNT/0.65ML injection Inject 19,400 Units into the skin once. 09/22/14  Yes Remo Lipps  A Daub, MD     ROS:   All other systems have been reviewed and were otherwise negative with the exception of those mentioned in the HPI and as above.    PHYSICAL EXAM: Filed Vitals:   04/04/15 1050  BP: 118/76  Pulse: 67  Temp: 98 F (36.7 C)  Resp: 18   Body mass index is 24.85 kg/(m^2).   General: Alert, no acute distress HEENT:  Normocephalic, atraumatic, oropharynx patent. Eye: Juliette Mangle [facility] Cardiovascular:  Regular rate and rhythm, no rubs murmurs or gallops.  No Carotid bruits, radial pulse intact. No pedal edema.  Respiratory: Clear to auscultation bilaterally.  No wheezes, rales, or rhonchi.  No cyanosis, no use of accessory musculature Abdominal: Large scar over the abdomen with tenderness right lower quadrant, unchanged from previous. Musculoskeletal: Gait intact. No edema, tenderness Skin: No rashes. Neurologic: Facial musculature symmetric. Psychiatric: Patient acts appropriately throughout our interaction. Lymphatic: No cervical or submandibular lymphadenopathy    LABS:    EKG/XRAY:   Primary read interpreted by Dr. Everlene Farrier at [facility].   ASSESSMENT/PLAN: This is a complicated case. [facility] but due to concerns about possible metastatic disease surgery was canceled. He has a follow-up appointment to be seen in December. Scans will be repeated at that time and decision made whether patient should have surgery or be enrolled in a investigational drug treatment program. He has also been stressed regarding his mother being down here for 1 month. He  was given the number of a counselor. He will continue to go to group therapy. I will discuss his situation with one of the nurses in our office and see if we could give him some more assistance.  By signing my name below, I, [name], attest that this documentation has been prepared under the direction and in the presence of Douglas Queen, MD.  Electronically Signed: [name], Medical Scribe. 04/04/2015. 10:53 AM.   Johney [place] sideeffects, risk and benefits, and alternatives of medications d/w patient. Patient is aware that all medications have potential sideeffects and we are unable to predict every sideeffect or drug-drug interaction that may occur.  Douglas Queen MD 04/04/2015 10:53 AM

## 2015-04-19 ENCOUNTER — Telehealth: Payer: Self-pay

## 2015-04-19 DIAGNOSIS — M25512 Pain in left shoulder: Secondary | ICD-10-CM

## 2015-04-19 NOTE — Telephone Encounter (Signed)
Patient is calling to request a refill for hydrocodone. He states that he has enough to last until Thursday and he has cancer. Please call when ready for pick up!

## 2015-04-20 MED ORDER — HYDROCODONE-ACETAMINOPHEN 7.5-325 MG PO TABS: ORAL_TABLET | ORAL | Status: DC

## 2015-04-20 NOTE — Telephone Encounter (Signed)
Refill provided in Dr. Perfecto Kingdom absence. Please have patient f/u with Dr. Everlene Farrier.

## 2015-04-21 NOTE — Telephone Encounter (Signed)
Pt.notified

## 2015-04-21 NOTE — Telephone Encounter (Signed)
Rx ready to pick up. In pick up draw.

## 2015-04-28 ENCOUNTER — Other Ambulatory Visit: Payer: Self-pay | Admitting: Emergency Medicine

## 2015-04-29 ENCOUNTER — Ambulatory Visit (INDEPENDENT_AMBULATORY_CARE_PROVIDER_SITE_OTHER): Payer: BLUE CROSS/BLUE SHIELD | Admitting: Emergency Medicine

## 2015-04-29 ENCOUNTER — Encounter: Payer: Self-pay | Admitting: Emergency Medicine

## 2015-04-29 VITALS — BP 110/86 | HR 69 | Temp 97.8°F | Resp 16 | Ht 72.5 in | Wt 192.4 lb

## 2015-04-29 DIAGNOSIS — C649 Malignant neoplasm of unspecified kidney, except renal pelvis: Secondary | ICD-10-CM | POA: Diagnosis not present

## 2015-04-29 DIAGNOSIS — C641 Malignant neoplasm of right kidney, except renal pelvis: Secondary | ICD-10-CM

## 2015-04-29 DIAGNOSIS — M899 Disorder of bone, unspecified: Secondary | ICD-10-CM

## 2015-04-29 DIAGNOSIS — F17200 Nicotine dependence, unspecified, uncomplicated: Secondary | ICD-10-CM | POA: Diagnosis not present

## 2015-04-29 DIAGNOSIS — D481 Neoplasm of uncertain behavior of connective and other soft tissue: Secondary | ICD-10-CM | POA: Diagnosis not present

## 2015-04-29 DIAGNOSIS — M25512 Pain in left shoulder: Secondary | ICD-10-CM | POA: Diagnosis not present

## 2015-04-29 NOTE — Progress Notes (Signed)
Subjective:  This chart was scribed for Douglas Russian, MD by Tamsen Roers, at [facility].  This patient was seen in room 22 and the patient's care was started at 10:35 AM.    Chief Complaint  Patient presents with  . Follow-up  . Hypertension  . Rrenal cancer     Patient ID: Douglas [name], male    DOB: May 01, 1956, 59 y.o.   MRN: [id number]  HPI  HPI Comments: Douglas [name] is a 59 y.o. male with a history of hereditary leiomyomatosis and renal cell cancer who presents to the [facility] for a follow up. Patient saw an oncologist at the [facility] and states that the visit went "excellent".  He states that the oncologist was willing to help him in many different ways and looked thoroughly though his past history.  He is currently deciding on whether to have surgery or start chemotherapy right away.  Patient is up to date with his immunizations.  He has been going to football games every week.  He is currently smoking less and is planning on quitting completely.   Left Shoulder/elbow pain: Patient had a fall on the bleachers at a football game recently and states that he caught himself with his hands afterwards and started having shoulder pain.  He states that the pain has been getting better all week.     Patient Active Problem List   Diagnosis Date Noted  . CAD (coronary artery disease) 03/25/2014  . Protein-calorie malnutrition, severe ([place]) 10/17/2013  . Complicated UTI (urinary tract infection) 10/16/2013  . Pyelonephritis 10/16/2013  . 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) ([place]) 08/20/2012  . Renal calculi   . GERD (gastroesophageal reflux disease)   . Allergic rhinitis   . Osteoarthritis   . Encounter for long-term (current) use of other medications   . Hyperlipidemia   . Tobacco use disorder   . COPD (chronic obstructive pulmonary disease) ([place])   . Unspecified vitamin D deficiency   .  Hypertension    Past Medical History  Diagnosis Date  . Renal calculi   . Compression fracture     S/P L-SPINE; pt does not recall this hx on 10/16/2013  . GERD (gastroesophageal reflux disease)   . History of surgical fusion joint     DDD C-SPINE  . Hemoptysis   . Allergic rhinitis   . Hyperlipidemia   . Encounter for long-term (current) use of other medications   . COPD (chronic obstructive pulmonary disease) (HCC)     FVC .66  . Tobacco use disorder   . Melanoma in situ ([place])   . Unspecified vitamin D deficiency   . Leiomyoma OF SKIN    INCREASED RISK RENAL CELL CA  NEEDS CT OF KIDNEYS EVERY 2 YEARS NEXT DUE  02/06/13  . Hypertension   . Neuromuscular disorder ([place])   . History of blood transfusion 09/2013    "think so; not sure; related to big OR"  . Osteoarthritis   . Arthritis     "all over my body" (10/16/2013)  . Chronic back pain     "neck to lower back" (10/16/2013)  . Depression     "situational since 09/22/2013 OR"  . Melanoma of back ([place])   . 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) ([place])   . Renal cell carcinoma ([place])     type 2; papillary   Past Surgical History  Procedure Laterality Date  . Anterior cruciate  ligament repair Bilateral [id number]    [phone]  . Anterior cervical decomp/discectomy fusion  1992  . Hand ligament reconstruction Right   . Robotic assited partial nephrectomy  05/22/2012    Procedure: ROBOTIC ASSITED PARTIAL NEPHRECTOMY;  Surgeon: Alexis Frock, MD;  Location: [facility];  Service: Urology;  Laterality: Right;  Right Robotic Cyst Decortication and Partial Nephrectomy   . Spine surgery    . Right colectomy Right 09/22/2013  . Cholecystectomy  09/22/2013    "had tumors in it"  . Appendectomy  09/22/2013  . Removal of fascia overlying the right psoas Right 09/22/2013  . Partial nephrectomy Right 09/22/2013  . Renal artery stent Right 09/22/2013  . Lymph node dissection Right 09/22/2013    "in the area of the kidney; they were  clean"  . Harvest bone graft Right 1992    "hip; for neck fusion"  . Inguinal hernia repair Right 2004  . Ligament repair Right ~ 2001    "little finger"  . Melanoma excision  ~ 2007    "off my back"   Allergies  Allergen Reactions  . Avelox [Moxifloxacin Hcl In Nacl] Hives, Shortness Of Breath and Swelling  . Moxifloxacin Hives   Prior to Admission medications   Medication Sig Start Date End Date Taking? Authorizing Provider  atorvastatin (LIPITOR) 10 MG tablet TAKE ONE BY MOUTH AT BEDTIME 09/30/14   Douglas Russian, MD  atorvastatin (LIPITOR) 10 MG tablet TAKE 1 BY MOUTH AT BEDTIME 02/18/15   Douglas Russian, MD  B Complex-C (B-COMPLEX WITH VITAMIN C) tablet Take 1 tablet by mouth daily.    Historical Provider, MD  cetirizine (ZYRTEC) 10 MG tablet Take 10 mg by mouth daily.    Historical Provider, MD  cholecalciferol (VITAMIN D) 1000 UNITS tablet Take 1,000 Units by mouth daily.    Historical Provider, MD  fluticasone (FLONASE) 50 MCG/ACT nasal spray Place 1 spray into both nostrils at bedtime.    Historical Provider, MD  HYDROcodone-acetaminophen (NORCO) 7.5-325 MG tablet 1 tablet every 8 hours as needed for pain 04/20/15   Jaynee Eagles, PA-C  LORazepam (ATIVAN) 0.5 MG tablet Take 1 tablet twice a day as needed for stress 04/01/15   Douglas Russian, MD  losartan (COZAAR) 50 MG tablet TAKE ONE BY MOUTH DAILY BEFORE BREAKFAST. 09/30/14   Douglas Russian, MD  losartan (COZAAR) 50 MG tablet TAKE 1 BY MOUTH DAILY BEFORE BREAKFAST 02/18/15   Douglas Russian, MD  Multiple Vitamin (MULTIVITAMIN WITH MINERALS) TABS tablet Take 1 tablet by mouth daily.    Historical Provider, MD  nicotine polacrilex (NICORETTE) 4 MG gum Take 4 mg by mouth as needed for smoking cessation.    Historical Provider, MD  omeprazole (PRILOSEC OTC) 20 MG tablet Take 20 mg by mouth daily.    Historical Provider, MD  vitamin C (ASCORBIC ACID) 500 MG tablet Take 500 mg by mouth daily.    Historical Provider, MD  zoster vaccine live, PF,  (ZOSTAVAX) 09811 UNT/0.65ML injection Inject 19,400 Units into the skin once. 09/22/14   Douglas Russian, MD   Social History   Social History  . Marital Status: Married    Spouse Name: N/A  . Number of Children: N/A  . 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 Smoking status: Current Every Day Smoker -- 0.50 packs/day for 40 years    Types: Cigarettes  . Smokeless tobacco: Never Used  Comment: Pt chewing Nicorette gum currently  . Alcohol Use: 0.0 oz/week    0 Standard drinks or equivalent per week     Comment: 10/16/2013 "2-3 beers daily generally; nothing last month or so"  . Drug Use: No  . Sexual Activity: Not Currently   Other Topics Concern  . Not on file   Social History Narrative   Walks daily for exercise.    Review of Systems  Constitutional: Negative for fever and chills.  Eyes: Negative for redness and itching.  Respiratory: Negative for cough, choking and shortness of breath.   Gastrointestinal: Negative for nausea and vomiting.  Musculoskeletal: Positive for myalgias. Negative for neck pain and neck stiffness.  Neurological: Negative for syncope and speech difficulty.       Objective:   Physical Exam  CONSTITUTIONAL: Well developed/well nourished HEAD: Normocephalic/atraumatic EYES: EOMI/PERRL CV: S1/S2 noted, no murmurs/rubs/gallops noted LUNGS: Lungs are clear to auscultation bilaterally, no apparent distress ABDOMEN: He has persistent tenderness in right lower abdomen GU:no cva tenderness NEURO: Pt is awake/alert/appropriate, moves all extremitiesx4.  No facial droop.   EXTREMITIES: pulses normal/equal, Atrophy around the left deltoid.   SKIN: warm, color normal PSYCH: no abnormalities of mood noted, alert and oriented to situation     Filed Vitals:   04/29/15 1026  BP: 110/86  Pulse: 69  Temp: 97.8 F (36.6 C)  TempSrc: Oral  Resp: 16  Height: 6' 0.5" (1.842 m)    Weight: 192 lb 6.4 oz (87.272 kg)  SpO2: 97%      Assessment & Plan:  [facility]. They will decide on need for surgery at that time. He saw the oncologist and [facility] who recommended he start on chemotherapy as soon as possible. He is agreeable to this. He will be in contact following his visit to [facility] for an updated PET scan.I personally performed the services described in this documentation, which was scribed in my presence. The recorded information has been reviewed and is accurate.

## 2015-05-05 ENCOUNTER — Other Ambulatory Visit: Payer: Self-pay | Admitting: *Deleted

## 2015-05-05 MED ORDER — FLUTICASONE PROPIONATE 50 MCG/ACT NA SUSP: 2.0000 | Freq: Every day | NASAL | Status: DC

## 2015-05-06 ENCOUNTER — Other Ambulatory Visit: Payer: Self-pay | Admitting: Emergency Medicine

## 2015-05-06 DIAGNOSIS — M25512 Pain in left shoulder: Secondary | ICD-10-CM

## 2015-05-06 MED ORDER — HYDROCODONE-ACETAMINOPHEN 7.5-325 MG PO TABS: 1.0000 | ORAL_TABLET | Freq: Four times a day (QID) | ORAL | Status: DC | PRN

## 2015-05-10 ENCOUNTER — Other Ambulatory Visit: Payer: Self-pay | Admitting: Emergency Medicine

## 2015-05-17 ENCOUNTER — Telehealth: Payer: Self-pay

## 2015-05-17 NOTE — Telephone Encounter (Signed)
Pt in need of his HYDROCODONE 7.5-325 MG. Please call [phone] when ready

## 2015-05-18 ENCOUNTER — Telehealth: Payer: Self-pay

## 2015-05-18 ENCOUNTER — Other Ambulatory Visit: Payer: Self-pay | Admitting: Emergency Medicine

## 2015-05-18 DIAGNOSIS — M25512 Pain in left shoulder: Secondary | ICD-10-CM

## 2015-05-18 MED ORDER — HYDROCODONE-ACETAMINOPHEN 7.5-325 MG PO TABS: ORAL_TABLET | ORAL | Status: DC

## 2015-05-18 NOTE — Telephone Encounter (Signed)
LMVM stating patient's RX is ready for pickup at front desk of [address].

## 2015-06-04 ENCOUNTER — Ambulatory Visit (INDEPENDENT_AMBULATORY_CARE_PROVIDER_SITE_OTHER): Payer: BLUE CROSS/BLUE SHIELD | Admitting: Emergency Medicine

## 2015-06-04 VITALS — BP 124/84 | HR 75 | Temp 98.3°F | Resp 16 | Ht 73.0 in | Wt 195.0 lb

## 2015-06-04 DIAGNOSIS — R1011 Right upper quadrant pain: Secondary | ICD-10-CM | POA: Diagnosis not present

## 2015-06-04 DIAGNOSIS — Z23 Encounter for immunization: Secondary | ICD-10-CM

## 2015-06-04 DIAGNOSIS — F17200 Nicotine dependence, unspecified, uncomplicated: Secondary | ICD-10-CM | POA: Diagnosis not present

## 2015-06-04 DIAGNOSIS — C649 Malignant neoplasm of unspecified kidney, except renal pelvis: Secondary | ICD-10-CM

## 2015-06-04 DIAGNOSIS — M25512 Pain in left shoulder: Secondary | ICD-10-CM | POA: Diagnosis not present

## 2015-06-04 DIAGNOSIS — M899 Disorder of bone, unspecified: Secondary | ICD-10-CM | POA: Diagnosis not present

## 2015-06-04 MED ORDER — HYDROCODONE-ACETAMINOPHEN 7.5-325 MG PO TABS: ORAL_TABLET | ORAL | Status: DC

## 2015-06-04 NOTE — Progress Notes (Addendum)
By signing my name below, I, Rawaa Al Rifaie, attest that this documentation has been prepared under the direction and in the presence of [name], MD.  Leandra Kern, Medical Scribe. 06/04/2015.  9:46 AM.  Chief Complaint:  Chief Complaint  Patient presents with  . Follow-up    HPI: Douglas [name] is a 59 y.o. male with a history of renal cell cancer with metastatic disease, and hereditary leiomyomatosis who reports to [facility] today for a follow up.  Pt has decided to undergo chemo therapy in January. The plan is that he is to [facility] and has repeated scanning.  Pt notes that he has been doing well. He reports that he followed up with the oncologist at [facility], whom he indicates was very knowledgeable about his disease, and explained his current condition to him and gave him his options. He was advised that the spot in his L3 could be severe DDD., in addition, the spot on the sternum has changed in growth much since October and it still does not show up in the CT therefore it is stable however it is still reflective of cancer. Pt states that his other spots are gradually growing, now up to 5-6 cm. Pt was very satisfied with this visit, and notes that he is happy that he has a plan now. He reports that exact date of starting the chemo therapy are not set as of yet due to scheduling difficulties. He is still to have all his scans repeated. Pt was advised to stop fishing, which is a hobby the patient is very passionate about, due to the possibility of experiencing side effects with the medications that he will start taking and the different risks with fishing. He still smokes about half a pack per day, however he plans to quit very soon due to possible side effects with the therapy that he will undergo. Pt does not drink alcohol. Pt reports that his work situation is not going very well, he has discussed with HR that he is to retire after a short period of time. He indicates that he has  arranged for his mother to move, which he is not very pleased with, however he does understand that this is a step that he has to take due to his condition.  He expresses that his quality of life is very satisfactory, he is staying active with mowing the lawn, and voices that he "does not let this thing stop him from anything".  Pt is UTD with his PNA, and shingles vaccine, he is interested in getting TDAP vaccine.    Past Medical History  Diagnosis Date  . Renal calculi   . Compression fracture     S/P L-SPINE; pt does not recall this hx on 10/16/2013  . GERD (gastroesophageal reflux disease)   . History of surgical fusion joint     DDD C-SPINE  . Hemoptysis   . Allergic rhinitis   . Hyperlipidemia   . Encounter for long-term (current) use of other medications   . COPD (chronic obstructive pulmonary disease) (HCC)     FVC .66  . Tobacco use disorder   . Melanoma in situ ([place])   . Unspecified vitamin D deficiency   . Leiomyoma OF SKIN    INCREASED RISK RENAL CELL CA  NEEDS CT OF KIDNEYS EVERY 2 YEARS NEXT DUE  02/06/13  . Hypertension   . Neuromuscular disorder ([place])   . History of blood transfusion 09/2013    "  think so; not sure; related to big OR"  . Osteoarthritis   . Arthritis     "all over my body" (10/16/2013)  . Chronic back pain     "neck to lower back" (10/16/2013)  . Depression     "situational since 09/22/2013 OR"  . Melanoma of back ([place])   . 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 Renal cell carcinoma ([place])     type 2; papillary   Past Surgical History  Procedure Laterality Date  . Anterior cruciate ligament repair Bilateral [id number]    [phone]  . Anterior cervical decomp/discectomy fusion  1992  . Hand ligament reconstruction Right   . Robotic assited partial nephrectomy  05/22/2012    Procedure: ROBOTIC ASSITED PARTIAL NEPHRECTOMY;  Surgeon: Alexis Frock, MD;  Location: [facility];  Service: Urology;  Laterality: Right;  Right  Robotic Cyst Decortication and Partial Nephrectomy   . Spine surgery    . Right colectomy Right 09/22/2013  . Cholecystectomy  09/22/2013    "had tumors in it"  . Appendectomy  09/22/2013  . Removal of fascia overlying the right psoas Right 09/22/2013  . Partial nephrectomy Right 09/22/2013  . Renal artery stent Right 09/22/2013  . Lymph node dissection Right 09/22/2013    "in the area of the kidney; they were clean"  . Harvest bone graft Right 1992    "hip; for neck fusion"  . Inguinal hernia repair Right 2004  . Ligament repair Right ~ 2001    "little finger"  . Melanoma excision  ~ 2007    "off my back"   Social History   Social History  . Marital Status: Married    Spouse Name: N/A  . Number of Children: N/A  . 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 Smoking status: Current Every Day Smoker -- 0.50 packs/day for 40 years    Types: Cigarettes  . Smokeless tobacco: Never Used     Comment: Pt chewing Nicorette gum currently  . Alcohol Use: 0.0 oz/week    0 Standard drinks or equivalent per week     Comment: 10/16/2013 "2-3 beers daily generally; nothing last month or so"  . Drug Use: No  . Sexual Activity: Not Currently   Other Topics Concern  . None   Social History Narrative   Walks daily for exercise.   Family History  Problem Relation Age of Onset  . Adopted: Yes  . Hypertension Father    Allergies  Allergen Reactions  . Avelox [Moxifloxacin Hcl In Nacl] Hives, Shortness Of Breath and Swelling  . Moxifloxacin Hives   Prior to Admission medications   Medication Sig Start Date End Date Taking? Authorizing Provider  atorvastatin (LIPITOR) 10 MG tablet TAKE 1 BY MOUTH AT BEDTIME 05/10/15  Yes [name], MD  B Complex-C (B-COMPLEX WITH VITAMIN C) tablet Take 1 tablet by mouth daily.   Yes Historical Provider, MD  cetirizine (ZYRTEC) 10 MG tablet Take 10 mg by mouth daily.   Yes Historical Provider,  MD  cholecalciferol (VITAMIN D) 1000 UNITS tablet Take 1,000 Units by mouth daily.   Yes Historical Provider, MD  fluticasone (FLONASE) 50 MCG/ACT nasal spray Place 2 sprays into both nostrils daily. 05/05/15  Yes [name], MD  HYDROcodone-acetaminophen (NORCO) 7.5-325 MG tablet 1 tablet every 8 hours as needed for pain 05/18/15  Yes [name], MD  LORazepam (ATIVAN)  0.5 MG tablet Take 1 tablet twice a day as needed for stress 04/01/15  Yes [name], MD  losartan (COZAAR) 50 MG tablet TAKE 1 BY MOUTH DAILY BEFORE BREAKFAST 05/10/15  Yes [name], MD  Multiple Vitamin (MULTIVITAMIN WITH MINERALS) TABS tablet Take 1 tablet by mouth daily.   Yes Historical Provider, MD  nicotine polacrilex (NICORETTE) 4 MG gum Take 4 mg by mouth as needed for smoking cessation.   Yes Historical Provider, MD  omeprazole (PRILOSEC OTC) 20 MG tablet Take 20 mg by mouth daily.   Yes Historical Provider, MD  vitamin C (ASCORBIC ACID) 500 MG tablet Take 500 mg by mouth daily.   Yes Historical Provider, MD  zoster vaccine live, PF, (ZOSTAVAX) 36644 UNT/0.65ML injection Inject 19,400 Units into the skin once. 09/22/14  Yes [name], MD     ROS: The patient denies fevers, chills, night sweats, unintentional weight loss, chest pain, palpitations, wheezing, dyspnea on exertion, nausea, vomiting, abdominal pain, dysuria, hematuria, melena, numbness, weakness, or tingling.   All other systems have been reviewed and were otherwise negative with the exception of those mentioned in the HPI and as above.    PHYSICAL EXAM: Filed Vitals:   06/04/15 0851  BP: 124/84  Pulse: 75  Temp: 98.3 F (36.8 C)  Resp: 16   Body mass index is 25.73 kg/(m^2).   General: Alert, no acute distress HEENT:  Normocephalic, atraumatic, oropharynx patent. Eye: Juliette Mangle [facility] Cardiovascular:  Regular rate and rhythm, no rubs murmurs or gallops.  No Carotid bruits, radial pulse intact. No pedal edema.  Respiratory: Clear to  auscultation bilaterally.  No wheezes, rales, or rhonchi.  No cyanosis, no use of accessory musculature Abdominal: No organomegaly, abdomen is soft and non-tender, positive bowel sounds.  No masses. Musculoskeletal: Gait intact. No edema, tenderness Skin: No rashes. Neurologic: Facial musculature symmetric. Psychiatric: Patient acts appropriately throughout our interaction. Lymphatic: No cervical or submandibular lymphadenopathy   LABS:    EKG/XRAY:   Primary read interpreted by Dr. [facility]lene Farrier at [facility].   ASSESSMENT/PLAN: Patient taking 4-5 pain meds per day. I did give him #80 tablets to last him until he returns from [place]. He is scheduled to be admitted to [facility] January 1 for initiation of chemotherapy. He will need repeat scans prior to this being done. We'll recheck once he returns. He was given a T dap  Today.I personally performed the services described in this documentation, which was scribed in my presence. The recorded information has been reviewed and is accurate.    Gross sideeffects, risk and benefits, and alternatives of medications d/w patient. Patient is aware that all medications have potential sideeffects and we are unable to predict every sideeffect or drug-drug interaction that may occur.  [name] MD 06/04/2015 9:25 AM

## 2015-06-21 ENCOUNTER — Ambulatory Visit (INDEPENDENT_AMBULATORY_CARE_PROVIDER_SITE_OTHER): Payer: BLUE CROSS/BLUE SHIELD | Admitting: Emergency Medicine

## 2015-06-21 VITALS — BP 116/78 | HR 76 | Temp 98.1°F | Resp 18 | Ht 72.75 in | Wt 195.6 lb

## 2015-06-21 DIAGNOSIS — F439 Reaction to severe stress, unspecified: Secondary | ICD-10-CM

## 2015-06-21 DIAGNOSIS — Z658 Other specified problems related to psychosocial circumstances: Secondary | ICD-10-CM | POA: Diagnosis not present

## 2015-06-21 DIAGNOSIS — I10 Essential (primary) hypertension: Secondary | ICD-10-CM | POA: Diagnosis not present

## 2015-06-21 DIAGNOSIS — C641 Malignant neoplasm of right kidney, except renal pelvis: Secondary | ICD-10-CM

## 2015-06-21 DIAGNOSIS — C649 Malignant neoplasm of unspecified kidney, except renal pelvis: Secondary | ICD-10-CM

## 2015-06-21 DIAGNOSIS — D481 Neoplasm of uncertain behavior of connective and other soft tissue: Secondary | ICD-10-CM

## 2015-06-21 DIAGNOSIS — M25512 Pain in left shoulder: Secondary | ICD-10-CM

## 2015-06-21 DIAGNOSIS — F17200 Nicotine dependence, unspecified, uncomplicated: Secondary | ICD-10-CM | POA: Diagnosis not present

## 2015-06-21 DIAGNOSIS — R1011 Right upper quadrant pain: Secondary | ICD-10-CM

## 2015-06-21 MED ORDER — HYDROCODONE-ACETAMINOPHEN 7.5-325 MG PO TABS: ORAL_TABLET | ORAL | Status: DC

## 2015-06-21 MED ORDER — LORAZEPAM 0.5 MG PO TABS: ORAL_TABLET | ORAL | Status: DC

## 2015-06-21 NOTE — Progress Notes (Signed)
Patient ID: Douglas [name], male   DOB: [birth year], 60 y.o.   MRN: [id number]     By signing my name below, I, [name], attest that this documentation has been prepared under the direction and in the presence of [name], MD.  Electronically Signed: [name], Medical Scribe. 06/21/2015. 9:55 AM.   Chief Complaint:  Chief Complaint  Patient presents with  . Follow-up    for test results    HPI: Douglas [name] is a 60 y.o. male with a history of hereditary leiomyomatosis and renal cell cancer who reports to [facility] today for a follow-up for test results. Patient is doing fine with his current pain medications; he usually takes 3-5 pills a day but usually 4 a day. He does not feel he needs to go to pain management. Patient is seeing an oral surgeon tomorrow. His imaging studies have not been scheduled yet. He notes that he has been smoking a bit more during his trip [facility], more than 10 cigarettes a day.  Past Medical History  Diagnosis Date  . Renal calculi   . Compression fracture     S/P L-SPINE; pt does not recall this hx on 10/16/2013  . GERD (gastroesophageal reflux disease)   . History of surgical fusion joint     DDD C-SPINE  . Hemoptysis   . Allergic rhinitis   . Hyperlipidemia   . Encounter for long-term (current) use of other medications   . COPD (chronic obstructive pulmonary disease) (HCC)     FVC .66  . Tobacco use disorder   . Melanoma in situ ([place])   . Unspecified vitamin D deficiency   . Leiomyoma OF SKIN    INCREASED RISK RENAL CELL CA  NEEDS CT OF KIDNEYS EVERY 2 YEARS NEXT DUE  02/06/13  . Hypertension   . Neuromuscular disorder ([place])   . History of blood transfusion 09/2013    "think so; not sure; related to big OR"  . Osteoarthritis   . Arthritis     "all over my body" (10/16/2013)  . Chronic back pain     "neck to lower back" (10/16/2013)  . Depression     "situational since 09/22/2013 OR"  . Melanoma of back ([place])   . Hereditary  leiomyomatosis and renal cell cancer (HLRCC) ([place])   . Renal cell carcinoma ([place])     type 2; papillary   Past Surgical History  Procedure Laterality Date  . Anterior cruciate ligament repair Bilateral [id number]    [phone]  . Anterior cervical decomp/discectomy fusion  1992  . Hand ligament reconstruction Right   . Robotic assited partial nephrectomy  05/22/2012    Procedure: ROBOTIC ASSITED PARTIAL NEPHRECTOMY;  Surgeon: Alexis Frock, MD;  Location: [facility];  Service: Urology;  Laterality: Right;  Right Robotic Cyst Decortication and Partial Nephrectomy   . Spine surgery    . Right colectomy Right 09/22/2013  . Cholecystectomy  09/22/2013    "had tumors in it"  . Appendectomy  09/22/2013  . Removal of fascia overlying the right psoas Right 09/22/2013  . Partial nephrectomy Right 09/22/2013  . Renal artery stent Right 09/22/2013  . Lymph node dissection Right 09/22/2013    "in the area of the kidney; they were clean"  . Harvest bone graft Right 1992    "hip; for neck fusion"  . Inguinal hernia repair Right 2004  . Ligament repair Right ~ 2001    "little finger"  . Melanoma excision  ~ 2007    "  off my back"   Social History   Social History  . Marital Status: Married    Spouse Name: N/A  . Number of Children: N/A  . 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 Smoking status: Current Every Day Smoker -- 0.50 packs/day for 40 years    Types: Cigarettes  . Smokeless tobacco: Never Used     Comment: Pt chewing Nicorette gum currently  . Alcohol Use: 0.0 oz/week    0 Standard drinks or equivalent per week     Comment: 10/16/2013 "2-3 beers daily generally; nothing last month or so"  . Drug Use: No  . Sexual Activity: Not Currently   Other Topics Concern  . None   Social History Narrative   Walks daily for exercise.   Family History  Problem Relation Age of Onset  . Adopted: Yes  . Hypertension Father     Allergies  Allergen Reactions  . Avelox [Moxifloxacin Hcl In Nacl] Hives, Shortness Of Breath and Swelling  . Moxifloxacin Hives   Prior to Admission medications   Medication Sig Start Date End Date Taking? Authorizing Provider  atorvastatin (LIPITOR) 10 MG tablet TAKE 1 BY MOUTH AT BEDTIME 05/10/15  Yes [name], MD  B Complex-C (B-COMPLEX WITH VITAMIN C) tablet Take 1 tablet by mouth daily.   Yes Historical Provider, MD  cetirizine (ZYRTEC) 10 MG tablet Take 10 mg by mouth daily.   Yes Historical Provider, MD  cholecalciferol (VITAMIN D) 1000 UNITS tablet Take 1,000 Units by mouth daily.   Yes Historical Provider, MD  fluticasone (FLONASE) 50 MCG/ACT nasal spray Place 2 sprays into both nostrils daily. 05/05/15  Yes [name], MD  HYDROcodone-acetaminophen (NORCO) 7.5-325 MG tablet 1 tablet every 4-6 hours for pain 06/04/15  Yes [name], MD  LORazepam (ATIVAN) 0.5 MG tablet Take 1 tablet twice a day as needed for stress 04/01/15  Yes [name], MD  losartan (COZAAR) 50 MG tablet TAKE 1 BY MOUTH DAILY BEFORE BREAKFAST 05/10/15  Yes [name], MD  Multiple Vitamin (MULTIVITAMIN WITH MINERALS) TABS tablet Take 1 tablet by mouth daily.   Yes Historical Provider, MD  nicotine polacrilex (NICORETTE) 4 MG gum Take 4 mg by mouth as needed for smoking cessation.   Yes Historical Provider, MD  omeprazole (PRILOSEC OTC) 20 MG tablet Take 20 mg by mouth daily.   Yes Historical Provider, MD  vitamin C (ASCORBIC ACID) 500 MG tablet Take 500 mg by mouth daily.   Yes Historical Provider, MD  zoster vaccine live, PF, (ZOSTAVAX) 16109 UNT/0.65ML injection Inject 19,400 Units into the skin once. 09/22/14  Yes [name], MD     ROS: The patient denies chest pain, palpitations, numbness or tingling.   All other systems have been reviewed and were otherwise negative with the exception of those mentioned in the HPI and as above.    PHYSICAL EXAM: Filed Vitals:   06/21/15 0944   BP: 116/78  Pulse: 76  Temp: 98.1 F (36.7 C)  Resp: 18   Body mass index is 25.98 kg/(m^2).   General: Alert, no acute distress HEENT:  Normocephalic, atraumatic, oropharynx patent. Eye: Juliette Mangle [facility] Cardiovascular:  Regular rate and rhythm, no rubs murmurs or gallops.  No Carotid bruits, radial pulse intact. No pedal edema.  Respiratory: Clear to auscultation bilaterally.  No wheezes, rales, or rhonchi.  No cyanosis, no use of accessory  musculature Abdominal: Large scar midline and to the right of the abdomen with right lower abdomen tenderness, unchanged from previous. Musculoskeletal: Gait intact. No edema, tenderness Skin: No rashes. Neurologic: Facial musculature symmetric. Psychiatric: Patient acts appropriately throughout our interaction. Lymphatic: No cervical or submandibular lymphadenopathy    LABS:    EKG/XRAY:   Primary read interpreted by Dr. Everlene Farrier at [facility].   ASSESSMENT/PLAN:   patient stable. [facility] end of the [facility] for repeat scanning in initiation of chemotherapy. His pain meds are refilled.I personally performed the services described in this documentation, which was scribed in my presence. The recorded information has been reviewed and is accurate.  Gross sideeffects, risk and benefits, and alternatives of medications d/w patient. Patient is aware that all medications have potential sideeffects and we are unable to predict every sideeffect or drug-drug interaction that may occur.  [name] MD 06/21/2015 9:55 AM

## 2015-06-30 ENCOUNTER — Telehealth: Payer: Self-pay | Admitting: Emergency Medicine

## 2015-06-30 NOTE — Telephone Encounter (Signed)
If you have some free time after your clinic tomorrow can we go over a disability form together for this patient?

## 2015-07-07 ENCOUNTER — Ambulatory Visit (INDEPENDENT_AMBULATORY_CARE_PROVIDER_SITE_OTHER): Payer: BLUE CROSS/BLUE SHIELD | Admitting: Emergency Medicine

## 2015-07-07 VITALS — BP 124/80 | HR 77 | Temp 97.8°F | Resp 14 | Ht 72.5 in | Wt 195.8 lb

## 2015-07-07 DIAGNOSIS — M25512 Pain in left shoulder: Secondary | ICD-10-CM | POA: Diagnosis not present

## 2015-07-07 MED ORDER — HYDROCODONE-ACETAMINOPHEN 7.5-325 MG PO TABS: ORAL_TABLET | ORAL | Status: DC

## 2015-07-07 NOTE — Progress Notes (Signed)
By signing my name below, I, [name], attest that this documentation has been prepared under the direction and in the presence of [name], MD.  Electronically Signed: [name], ED Scribe. 07/07/2015. 9:15 AM.  Chief Complaint:  Chief Complaint  Patient presents with  . Medication Refill    Hydrocodone  . Clinical trial    Patient wants discuss clinical trial he will be participating in regarding cancer   HPI: Douglas [name] is a 60 y.o. male who reports to [facility] today for a consultation. Pt has hx of hereditary leiomyomatosis and renal cell cancer. Pt has been started on avastin  and tarceva and would like to discus these medication. He is a smoker and reports having 4 cigarettes yesterday and would like to know if this will cause side effects with medication in some way. Pt last day of work is in 2 days. He is still attending support groups once a month and is seen by Rosann Auerbach every week. He recently had oral surgery a little over a week ago.   Pt is also needing a medication refill of hydrocodone, which he takes for shoulder, right lower abdominal pain  and back pain.     Past Medical History  Diagnosis Date  . Renal calculi   . Compression fracture     S/P L-SPINE; pt does not recall this hx on 10/16/2013  . GERD (gastroesophageal reflux disease)   . History of surgical fusion joint     DDD C-SPINE  . Hemoptysis   . Allergic rhinitis   . Hyperlipidemia   . Encounter for long-term (current) use of other medications   . COPD (chronic obstructive pulmonary disease) (HCC)     FVC .66  . Tobacco use disorder   . Melanoma in situ ([place])   . Unspecified vitamin D deficiency   . Leiomyoma OF SKIN    INCREASED RISK RENAL CELL CA  NEEDS CT OF KIDNEYS EVERY 2 YEARS NEXT DUE  02/06/13  . Hypertension   . Neuromuscular disorder ([place])   . History of blood transfusion 09/2013    "think so; not sure; related to big OR"  . Osteoarthritis   . Arthritis     "all over my body"  (10/16/2013)  . Chronic back pain     "neck to lower back" (10/16/2013)  . Depression     "situational since 09/22/2013 OR"  . Melanoma of back ([place])   . 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) ([place])   . Renal cell carcinoma ([place])     type 2; papillary   Past Surgical History  Procedure Laterality Date  . Anterior cruciate ligament repair Bilateral [id number]    [phone]  . Anterior cervical decomp/discectomy fusion  1992  . Hand ligament reconstruction Right   . Robotic assited partial nephrectomy  05/22/2012    Procedure: ROBOTIC ASSITED PARTIAL NEPHRECTOMY;  Surgeon: Alexis Frock, MD;  Location: [facility];  Service: Urology;  Laterality: Right;  Right Robotic Cyst Decortication and Partial Nephrectomy   . Spine surgery    . Right colectomy Right 09/22/2013  . Cholecystectomy  09/22/2013    "had tumors in it"  . Appendectomy  09/22/2013  . Removal of fascia overlying the right psoas Right 09/22/2013  . Partial nephrectomy Right 09/22/2013  . Renal artery stent Right 09/22/2013  . Lymph node dissection Right 09/22/2013    "in the area of the kidney; they were clean"  . Harvest bone graft Right 1992    "  hip; for neck fusion"  . Inguinal hernia repair Right 2004  . Ligament repair Right ~ 2001    "little finger"  . Melanoma excision  ~ 2007    "off my back"   Social History   Social History  . Marital Status: Married    Spouse Name: N/A  . Number of Children: N/A  . 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 Smoking status: Current Every Day Smoker -- 0.50 packs/day for 40 years    Types: Cigarettes  . Smokeless tobacco: Never Used     Comment: Pt chewing Nicorette gum currently  . Alcohol Use: 0.0 oz/week    0 Standard drinks or equivalent per week     Comment: 10/16/2013 "2-3 beers daily generally; nothing last month or so"  . Drug Use: No  . Sexual Activity: Not Currently   Other Topics  Concern  . None   Social History Narrative   Walks daily for exercise.   Family History  Problem Relation Age of Onset  . Adopted: Yes  . Hypertension Father    Allergies  Allergen Reactions  . Avelox [Moxifloxacin Hcl In Nacl] Hives, Shortness Of Breath and Swelling  . Moxifloxacin Hives   Prior to Admission medications   Medication Sig Start Date End Date Taking? Authorizing Provider  atorvastatin (LIPITOR) 10 MG tablet TAKE 1 BY MOUTH AT BEDTIME 05/10/15  Yes [name], MD  B Complex-C (B-COMPLEX WITH VITAMIN C) tablet Take 1 tablet by mouth daily.   Yes Historical Provider, MD  cetirizine (ZYRTEC) 10 MG tablet Take 10 mg by mouth daily.   Yes Historical Provider, MD  cholecalciferol (VITAMIN D) 1000 UNITS tablet Take 1,000 Units by mouth daily.   Yes Historical Provider, MD  fluticasone (FLONASE) 50 MCG/ACT nasal spray Place 2 sprays into both nostrils daily. 05/05/15  Yes [name], MD  HYDROcodone-acetaminophen ([place]) 7.5-325 MG tablet 1 tablet every 4-6 hours for pain 06/21/15  Yes [name], MD  LORazepam (ATIVAN) 0.5 MG tablet Take 1 tablet twice a day as needed for stress 06/21/15  Yes [name], MD  losartan (COZAAR) 50 MG tablet TAKE 1 BY MOUTH DAILY BEFORE BREAKFAST 05/10/15  Yes [name], MD  Multiple Vitamin (MULTIVITAMIN WITH MINERALS) TABS tablet Take 1 tablet by mouth daily.   Yes Historical Provider, MD  nicotine polacrilex (NICORETTE) 4 MG gum Take 4 mg by mouth as needed for smoking cessation.   Yes Historical Provider, MD  omeprazole (PRILOSEC OTC) 20 MG tablet Take 20 mg by mouth daily.   Yes Historical Provider, MD  vitamin C (ASCORBIC ACID) 500 MG tablet Take 500 mg by mouth daily.   Yes Historical Provider, MD  zoster vaccine live, PF, (ZOSTAVAX) 16109 UNT/0.65ML injection Inject 19,400 Units into the skin once. 09/22/14   [name], MD     ROS: The patient denies fevers, chills, night sweats, unintentional weight loss, chest pain,  palpitations, wheezing, dyspnea on exertion, nausea, vomiting, abdominal pain, dysuria, hematuria, melena, numbness, weakness, or tingling.   All other systems have been reviewed and were otherwise negative with the exception of those mentioned in the HPI and as above.    PHYSICAL EXAM: Filed Vitals:   07/07/15 0831  BP: 124/80  Pulse: 77  Temp: 97.8 F (36.6 C)  Resp: 14   Body mass index is 26.18 kg/(m^2).   General: Alert,  no acute distress HEENT:  Normocephalic, atraumatic, oropharynx patent. Eye: Juliette Mangle [facility] Cardiovascular:  Regular rate and rhythm, no rubs murmurs or gallops.  No Carotid bruits, radial pulse intact. No pedal edema.  Respiratory: Clear to auscultation bilaterally.  No wheezes and rales. He has rhonchi bilaterally in the bases .  No cyanosis, no use of accessory musculature. Abdominal: No organomegaly, abdomen is soft. Positive bowel sounds.  No masses. RLQ tenderness Musculoskeletal: Gait intact. No edema. Pain around left shoulder with pain with ROM.  Skin: No rashes. Neurologic: Facial musculature symmetric. Psychiatric: Patient acts appropriately throughout our interaction. Lymphatic: No cervical or submandibular lymphadenopathy   ASSESSMENT/PLAN: Patient will be on a trial of Avastin and Tarceva. This is an experimental protocol for treatment of his metastatic renal cancer. Patient will start February 1 at [place]. He was given refill on his hydrocodone to fill on  Friday. We'll recheck a once he returns from his initial treatment program at the [place].I personally performed the services described in this documentation, which was scribed in my presence. The recorded information has been reviewed and is accurate.   Gross sideeffects, risk and benefits, and alternatives of medications d/w patient. Patient is aware that all medications have potential sideeffects and we are unable to predict every sideeffect or drug-drug interaction that may occur.  [name]  MD 07/07/2015 9:15 AM

## 2015-07-24 ENCOUNTER — Ambulatory Visit (INDEPENDENT_AMBULATORY_CARE_PROVIDER_SITE_OTHER): Payer: BLUE CROSS/BLUE SHIELD | Admitting: Emergency Medicine

## 2015-07-24 VITALS — BP 124/84 | HR 67 | Temp 98.5°F | Resp 16 | Ht 73.0 in | Wt 197.0 lb

## 2015-07-24 DIAGNOSIS — Z658 Other specified problems related to psychosocial circumstances: Secondary | ICD-10-CM | POA: Diagnosis not present

## 2015-07-24 DIAGNOSIS — D481 Neoplasm of uncertain behavior of connective and other soft tissue: Secondary | ICD-10-CM

## 2015-07-24 DIAGNOSIS — M25512 Pain in left shoulder: Secondary | ICD-10-CM

## 2015-07-24 DIAGNOSIS — F17200 Nicotine dependence, unspecified, uncomplicated: Secondary | ICD-10-CM | POA: Diagnosis not present

## 2015-07-24 DIAGNOSIS — C641 Malignant neoplasm of right kidney, except renal pelvis: Secondary | ICD-10-CM

## 2015-07-24 DIAGNOSIS — F439 Reaction to severe stress, unspecified: Secondary | ICD-10-CM

## 2015-07-24 MED ORDER — HYDROCODONE-ACETAMINOPHEN 7.5-325 MG PO TABS: ORAL_TABLET | ORAL | Status: DC

## 2015-07-24 NOTE — Progress Notes (Signed)
By signing my name below, I, Moises Blood, attest that this documentation has been prepared under the direction and in the presence of [name], MD. Electronically Signed: Moises Blood, [place]. 07/24/2015 , 3:28 PM .  Patient was seen in room 13 .  Chief Complaint:  Chief Complaint  Patient presents with  . Medication Refill    Norco    HPI: Douglas [name] is a 60 y.o. male who reports to [facility] today for medication refill on his norco and medication discussion.  Pt started an experimental treatment for his metastatic renal cancer at [place] on February 1. He will return every 2 weeks for an infusion of avastin. He also takes tarseva 150 mg.   He states that the infusion of avastin went well. He's been logging his blood pressure. He had some nausea last night, but believes this is due to recent stress. His prilosec was also switched to protonix. They also noted that he may get a rash over time from the infusion and he was given cream for this.   He was taking 5 pain medications while up in [place].  He had 1 cigarette since the treatment. He's been on nicorette gum.   Past Medical History  Diagnosis Date  . Renal calculi   . Compression fracture     S/P L-SPINE; pt does not recall this hx on 10/16/2013  . GERD (gastroesophageal reflux disease)   . History of surgical fusion joint     DDD C-SPINE  . Hemoptysis   . Allergic rhinitis   . Hyperlipidemia   . Encounter for long-term (current) use of other medications   . COPD (chronic obstructive pulmonary disease) (HCC)     FVC .66  . Tobacco use disorder   . Melanoma in situ ([place])   . Unspecified vitamin D deficiency   . Leiomyoma OF SKIN    INCREASED RISK RENAL CELL CA  NEEDS CT OF KIDNEYS EVERY 2 YEARS NEXT DUE  02/06/13  . Hypertension   . Neuromuscular disorder ([place])   . History of blood transfusion 09/2013    "think so; not sure; related to big OR"  . Osteoarthritis   . Arthritis     "all over my body" (10/16/2013)  .  Chronic back pain     "neck to lower back" (10/16/2013)  . Depression     "situational since 09/22/2013 OR"  . Melanoma of back ([place])   . 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) ([place])   . Renal cell carcinoma ([place])     type 2; papillary   Past Surgical History  Procedure Laterality Date  . Anterior cruciate ligament repair Bilateral [id number]    [phone]  . Anterior cervical decomp/discectomy fusion  1992  . Hand ligament reconstruction Right   . Robotic assited partial nephrectomy  05/22/2012    Procedure: ROBOTIC ASSITED PARTIAL NEPHRECTOMY;  Surgeon: Alexis Frock, MD;  [facility];  Service: Urology;  Laterality: Right;  Right Robotic Cyst Decortication and Partial Nephrectomy   . Spine surgery    . Right colectomy Right 09/22/2013  . Cholecystectomy  09/22/2013    "had tumors in it"  . Appendectomy  09/22/2013  . Removal of fascia overlying the right psoas Right 09/22/2013  . Partial nephrectomy Right 09/22/2013  . Renal artery stent Right 09/22/2013  . Lymph node dissection Right 09/22/2013    "in the area of the kidney; they were clean"  . Harvest bone graft Right 1992    "hip;  for neck fusion"  . Inguinal hernia repair Right 2004  . Ligament repair Right ~ 2001    "little finger"  . Melanoma excision  ~ 2007    "off my back"   Social History   Social History  . Marital Status: Married    Spouse Name: N/A  . Number of Children: N/A  . 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 Smoking status: Former Smoker -- 0.50 packs/day for 40 years    Types: Cigarettes  . Smokeless tobacco: Never Used     Comment: Pt chewing Nicorette gum currently  . Alcohol Use: 0.0 oz/week    0 Standard drinks or equivalent per week     Comment: 10/16/2013 "2-3 beers daily generally; nothing last month or so"  . Drug Use: No  . Sexual Activity: Not Currently   Other Topics Concern  . None   Social  History Narrative   Walks daily for exercise.   Family History  Problem Relation Age of Onset  . Adopted: Yes  . Hypertension Father    Allergies  Allergen Reactions  . Avelox [Moxifloxacin Hcl In Nacl] Hives, Shortness Of Breath and Swelling  . Moxifloxacin Hives   Prior to Admission medications   Medication Sig Start Date End Date Taking? Authorizing Provider  atorvastatin (LIPITOR) 10 MG tablet TAKE 1 BY MOUTH AT BEDTIME 05/10/15   [name], MD  B Complex-C (B-COMPLEX WITH VITAMIN C) tablet Take 1 tablet by mouth daily.    Historical Provider, MD  cetirizine (ZYRTEC) 10 MG tablet Take 10 mg by mouth daily.    Historical Provider, MD  cholecalciferol (VITAMIN D) 1000 UNITS tablet Take 1,000 Units by mouth daily.    Historical Provider, MD  fluticasone (FLONASE) 50 MCG/ACT nasal spray Place 2 sprays into both nostrils daily. 05/05/15   [name], MD  HYDROcodone-acetaminophen (NORCO) 7.5-325 MG tablet 1 tablet every 4-6 hours for pain .prescription can be filled on 07/09/2015 07/07/15   [name], MD  LORazepam (ATIVAN) 0.5 MG tablet Take 1 tablet twice a day as needed for stress 06/21/15   [name], MD  losartan (COZAAR) 50 MG tablet TAKE 1 BY MOUTH DAILY BEFORE BREAKFAST 05/10/15   [name], MD  Multiple Vitamin (MULTIVITAMIN WITH MINERALS) TABS tablet Take 1 tablet by mouth daily.    Historical Provider, MD  nicotine polacrilex (NICORETTE) 4 MG gum Take 4 mg by mouth as needed for smoking cessation.    Historical Provider, MD  omeprazole (PRILOSEC OTC) 20 MG tablet Take 20 mg by mouth daily.    Historical Provider, MD  vitamin C (ASCORBIC ACID) 500 MG tablet Take 500 mg by mouth daily.    Historical Provider, MD  zoster vaccine live, PF, (ZOSTAVAX) 91478 UNT/0.65ML injection Inject 19,400 Units into the skin once. 09/22/14   [name], MD     ROS:  Constitutional: negative for fever, chills, night sweats, weight changes, or fatigue  HEENT: negative for  vision changes, hearing loss, congestion, rhinorrhea, ST, epistaxis, or sinus pressure Cardiovascular: negative for chest pain or palpitations Respiratory: negative for hemoptysis, wheezing, shortness of breath, or cough Abdominal: negative for abdominal pain, nausea, vomiting, diarrhea, or constipation Dermatological: negative for rash Neurologic: negative for headache, dizziness, or syncope All other systems reviewed and are otherwise negative with the exception to those above and in the HPI.  PHYSICAL EXAM: Filed  Vitals:   07/24/15 1342 07/24/15 1458  BP: 130/80 124/84  Pulse: 67   Temp: 98.5 F (36.9 C)   Resp: 16    Body mass index is 26 kg/(m^2).   General: Alert, no acute distress HEENT:  Normocephalic, atraumatic, oropharynx patent. Eye: Juliette Mangle [facility] Cardiovascular:  Regular rate and rhythm, no rubs murmurs or gallops.  No Carotid bruits, radial pulse intact. No pedal edema.  Respiratory: Clear to auscultation bilaterally.  No wheezes, rales, or rhonchi.  No cyanosis, no use of accessory musculature Abdominal: Has some RLQ abd discomfort and tends to hold his abdomen with his hands, No organomegaly, positive bowel sounds. No masses. Musculoskeletal: Gait intact. No edema, tenderness Skin: No rashes. Neurologic: Facial musculature symmetric. Psychiatric: Patient acts appropriately throughout our interaction.  Lymphatic: No cervical or submandibular lymphadenopathy Genitourinary/Anorectal: No acute findings BP recheck in room, sitting (left arm): 124/84  LABS:   EKG/XRAY:   Primary read interpreted by Dr. Everlene Farrier at [facility].   ASSESSMENT/PLAN: Answers questions about his chemotherapy. He has been advised she will develop a rash. He has doxycycline to take and was advised to use moisturizer cream and benzoyl peroxide to the lesions themselves. Pain medications were refilled. He is due to go back in 2 weeks for another round of chemotherapy.I personally performed the services  described in this documentation, which was scribed in my presence. The recorded information has been reviewed and is accurate.   Gross sideeffects, risk and benefits, and alternatives of medications d/w patient. Patient is aware that all medications have potential sideeffects and we are unable to predict every sideeffect or drug-drug interaction that may occur.  [name] MD 07/24/2015 3:28 PM

## 2015-07-28 ENCOUNTER — Ambulatory Visit (INDEPENDENT_AMBULATORY_CARE_PROVIDER_SITE_OTHER): Payer: BLUE CROSS/BLUE SHIELD | Admitting: Emergency Medicine

## 2015-07-28 VITALS — BP 134/78 | HR 56 | Temp 98.4°F | Resp 16 | Ht 72.0 in | Wt 193.2 lb

## 2015-07-28 DIAGNOSIS — D481 Neoplasm of uncertain behavior of connective and other soft tissue: Secondary | ICD-10-CM

## 2015-07-28 DIAGNOSIS — M25512 Pain in left shoulder: Secondary | ICD-10-CM

## 2015-07-28 DIAGNOSIS — C641 Malignant neoplasm of right kidney, except renal pelvis: Secondary | ICD-10-CM | POA: Diagnosis not present

## 2015-07-28 DIAGNOSIS — F17200 Nicotine dependence, unspecified, uncomplicated: Secondary | ICD-10-CM | POA: Diagnosis not present

## 2015-07-28 DIAGNOSIS — R110 Nausea: Secondary | ICD-10-CM | POA: Diagnosis not present

## 2015-07-28 MED ORDER — HYDROCODONE-ACETAMINOPHEN 7.5-325 MG PO TABS: ORAL_TABLET | ORAL | Status: DC

## 2015-07-28 MED ORDER — ONDANSETRON 4 MG PO TBDP: 4.0000 mg | ORAL_TABLET | Freq: Three times a day (TID) | ORAL | Status: DC | PRN

## 2015-07-28 NOTE — Progress Notes (Signed)
Patient ID: Douglas [name], male   DOB: 1956/01/25, 60 y.o.   MRN: [id number]     By signing my name below, I, Essence Howell, attest that this documentation has been prepared under the direction and in the presence of [name], MD Electronically Signed: Ladene Artist, ED Scribe 07/28/2015 at 11:52 AM.  Chief Complaint:  Chief Complaint  Patient presents with  . medicatioin reveiw    discuss a for Zofran   HPI: Douglas [name] is a 60 y.o. male, with a h/o metastatic cancer, who reports to [facility] today for a prescription for Zofran. Pt reports nausea onset 2 days ago that has resolved. [facility] and was advised to see his PCP for a prescription.   GERD Pt states that he had a panic attack a few days ago after doing research on the absorption of avastin infusion with regards to cigarette use. He has not had a cigarette in 38 hours but has been chewing nicorette gum. Pt also denies alcohol use. He expresses his concern of not getting enough of the drug.   Past Medical History  Diagnosis Date  . Renal calculi   . Compression fracture     S/P L-SPINE; pt does not recall this hx on 10/16/2013  . GERD (gastroesophageal reflux disease)   . History of surgical fusion joint     DDD C-SPINE  . Hemoptysis   . Allergic rhinitis   . Hyperlipidemia   . Encounter for long-term (current) use of other medications   . COPD (chronic obstructive pulmonary disease) (HCC)     FVC .66  . Tobacco use disorder   . Melanoma in situ ([place])   . Unspecified vitamin D deficiency   . Leiomyoma OF SKIN    INCREASED RISK RENAL CELL CA  NEEDS CT OF KIDNEYS EVERY 2 YEARS NEXT DUE  02/06/13  . Hypertension   . Neuromuscular disorder ([place])   . History of blood transfusion 09/2013    "think so; not sure; related to big OR"  . Osteoarthritis   . Arthritis     "all over my body" (10/16/2013)  . Chronic back pain     "neck to lower back" (10/16/2013)  . Depression     "situational since 09/22/2013 OR"  .  Melanoma of back ([place])   . 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) ([place])   . Renal cell carcinoma ([place])     type 2; papillary   Past Surgical History  Procedure Laterality Date  . Anterior cruciate ligament repair Bilateral [id number]    [phone]  . Anterior cervical decomp/discectomy fusion  1992  . Hand ligament reconstruction Right   . Robotic assited partial nephrectomy  05/22/2012    Procedure: ROBOTIC ASSITED PARTIAL NEPHRECTOMY;  Surgeon: Alexis Frock, MD;  [facility];  Service: Urology;  Laterality: Right;  Right Robotic Cyst Decortication and Partial Nephrectomy   . Spine surgery    . Right colectomy Right 09/22/2013  . Cholecystectomy  09/22/2013    "had tumors in it"  . Appendectomy  09/22/2013  . Removal of fascia overlying the right psoas Right 09/22/2013  . Partial nephrectomy Right 09/22/2013  . Renal artery stent Right 09/22/2013  . Lymph node dissection Right 09/22/2013    "in the area of the kidney; they were clean"  . Harvest bone graft Right 1992    "hip; for neck fusion"  . Inguinal hernia repair Right 2004  . Ligament repair Right ~ 2001    "  little finger"  . Melanoma excision  ~ 2007    "off my back"   Social History   Social History  . Marital Status: Married    Spouse Name: N/A  . Number of Children: N/A  . 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 Smoking status: Former Smoker -- 0.50 packs/day for 40 years    Types: Cigarettes  . Smokeless tobacco: Never Used     Comment: Pt chewing Nicorette gum currently  . Alcohol Use: 0.0 oz/week    0 Standard drinks or equivalent per week     Comment: 10/16/2013 "2-3 beers daily generally; nothing last month or so"  . Drug Use: No  . Sexual Activity: Not Currently   Other Topics Concern  . None   Social History Narrative   Walks daily for exercise.   Family History  Problem Relation Age of Onset  . Adopted: Yes    . Hypertension Father    Allergies  Allergen Reactions  . Avelox [Moxifloxacin Hcl In Nacl] Hives, Shortness Of Breath and Swelling  . Moxifloxacin Hives   Prior to Admission medications   Medication Sig Start Date End Date Taking? Authorizing Provider  atorvastatin (LIPITOR) 10 MG tablet TAKE 1 BY MOUTH AT BEDTIME 05/10/15  Yes [name], MD  B Complex-C (B-COMPLEX WITH VITAMIN C) tablet Take 1 tablet by mouth daily.   Yes Historical Provider, MD  cetirizine (ZYRTEC) 10 MG tablet Take 10 mg by mouth daily.   Yes Historical Provider, MD  cholecalciferol (VITAMIN D) 1000 UNITS tablet Take 1,000 Units by mouth daily.   Yes Historical Provider, MD  fluticasone (FLONASE) 50 MCG/ACT nasal spray Place 2 sprays into both nostrils daily. 05/05/15  Yes [name], MD  HYDROcodone-acetaminophen ([place]) 7.5-325 MG tablet 1 tablet every 4-6 hours for pain .prescription can be filled on 07/09/2015 07/24/15  Yes [name], MD  LORazepam (ATIVAN) 0.5 MG tablet Take 1 tablet twice a day as needed for stress 06/21/15  Yes [name], MD  losartan (COZAAR) 50 MG tablet TAKE 1 BY MOUTH DAILY BEFORE BREAKFAST 05/10/15  Yes [name], MD  Multiple Vitamin (MULTIVITAMIN WITH MINERALS) TABS tablet Take 1 tablet by mouth daily.   Yes Historical Provider, MD  nicotine polacrilex (NICORETTE) 4 MG gum Take 4 mg by mouth as needed for smoking cessation.   Yes Historical Provider, MD  pantoprazole (PROTONIX) 20 MG tablet Take 20 mg by mouth daily.   Yes Historical Provider, MD  vitamin C (ASCORBIC ACID) 500 MG tablet Take 500 mg by mouth daily.   Yes Historical Provider, MD  zoster vaccine live, PF, (ZOSTAVAX) 19147 UNT/0.65ML injection Inject 19,400 Units into the skin once. 09/22/14   [name], MD   ROS: The patient denies fevers, chills, night sweats, unintentional weight loss, chest pain, palpitations, wheezing, dyspnea on exertion, nausea, vomiting, abdominal pain, dysuria, hematuria, melena,  numbness, weakness, or tingling.   All other systems have been reviewed and were otherwise negative with the exception of those mentioned in the HPI and as above.    PHYSICAL EXAM: Filed Vitals:   07/28/15 1030  BP: 134/78  Pulse: 56  Temp: 98.4 F (36.9 C)  Resp: 16   Body mass index is 26.2 kg/(m^2).  General: Alert, no acute distress HEENT:  Normocephalic, atraumatic, oropharynx patent. Eye: Juliette Mangle [facility] Cardiovascular:  Regular rate and rhythm, no rubs  murmurs or gallops. No Carotid bruits, radial pulse intact. No pedal edema.  Respiratory: Clear to auscultation bilaterally. No wheezes, rales, or rhonchi. No cyanosis, no use of accessory musculature Abdominal: No organomegaly, abdomen is soft and non-tender, positive bowel sounds. No masses. Musculoskeletal: Gait intact. No edema, tenderness Skin: No rashes. Neurologic: Facial musculature symmetric. Psychiatric: Patient acts appropriately throughout our interaction. Lymphatic: No cervical or submandibular lymphadenopathy  LABS:  EKG/XRAY:   Primary read interpreted by Dr. Everlene Farrier at [facility].  ASSESSMENT/PLAN: Patient doing well with his chemotherapy. He has concerns about his smoking history and use of a PPI regarding dose of chemotherapy needed for him. I reassured him about this. He has not developed a rash that they were talking about related to his chemotherapy. I did supply him for Zofran to have for nausea and also gave him a prescription for hydrocodone to fill a week from Saturday.I personally performed the services described in this documentation, which was scribed in my presence. The recorded information has been reviewed and is accurate.    Gross sideeffects, risk and benefits, and alternatives of medications d/w patient. Patient is aware that all medications have potential sideeffects and we are unable to predict every sideeffect or drug-drug interaction that may occur.  [name] MD 07/28/2015 11:33 AM

## 2015-08-17 ENCOUNTER — Ambulatory Visit (INDEPENDENT_AMBULATORY_CARE_PROVIDER_SITE_OTHER): Payer: BLUE CROSS/BLUE SHIELD | Admitting: Emergency Medicine

## 2015-08-17 VITALS — BP 122/88 | HR 85 | Temp 98.0°F | Resp 20 | Ht 72.0 in | Wt 193.4 lb

## 2015-08-17 DIAGNOSIS — M25512 Pain in left shoulder: Secondary | ICD-10-CM

## 2015-08-17 DIAGNOSIS — D481 Neoplasm of uncertain behavior of connective and other soft tissue: Secondary | ICD-10-CM

## 2015-08-17 DIAGNOSIS — M899 Disorder of bone, unspecified: Secondary | ICD-10-CM | POA: Diagnosis not present

## 2015-08-17 DIAGNOSIS — M2550 Pain in unspecified joint: Secondary | ICD-10-CM | POA: Diagnosis not present

## 2015-08-17 DIAGNOSIS — C641 Malignant neoplasm of right kidney, except renal pelvis: Secondary | ICD-10-CM | POA: Diagnosis not present

## 2015-08-17 MED ORDER — HYDROCODONE-ACETAMINOPHEN 7.5-325 MG PO TABS: ORAL_TABLET | ORAL | Status: DC

## 2015-08-17 NOTE — Progress Notes (Signed)
Patient ID: Douglas [name], male   DOB: 1956/01/05, 60 y.o.   MRN: [id number]     By signing my name below, I, [name], attest that this documentation has been prepared under the direction and in the presence of [name], MD.  Electronically Signed: [name], Medical Scribe. 08/17/2015. 10:20 AM.   Chief Complaint:  Chief Complaint  Patient presents with  . Medication Refill    hydrocodone    HPI: Douglas [name] is a 60 y.o. male with a history of GERD, hereditary leiomyomatosis and renal cell cancer who reports to [facility] today for a follow-up and refill for hydrocodone. He has been taking the hydrocodone usually 5 times a day, but sometimes 6. Patient will be [facility] and will have treatment the following day. He stopped taking the pantoprazole and has been taking Pepcid AC in the morning. He had a nosebleed about 3 weeks ago one night, so he discontinued taking the Flonase. He is still having some diarrhea and has taken about 4-5 Zofran in the past 3 weeks. Patient notes he was recently started on Xgeva about 2 weeks ago; he will be receiving injections once every 4 weeks. Patient reports having intermittent, non-radiating pain in his left SI joint. The pain is worse when going from sitting to standing position. He also notes his shoulder pain has worsened over the past 3 weeks, due to the treatments. He states he has lost about 3 pounds and plans to start drinking breakfast shakes again.  Past Medical History  Diagnosis Date  . Renal calculi   . Compression fracture     S/P L-SPINE; pt does not recall this hx on 10/16/2013  . GERD (gastroesophageal reflux disease)   . History of surgical fusion joint     DDD C-SPINE  . Hemoptysis   . Allergic rhinitis   . Hyperlipidemia   . Encounter for long-term (current) use of other medications   . COPD (chronic obstructive pulmonary disease) (HCC)     FVC .66  . Tobacco use disorder   . Melanoma in situ ([place])   .  Unspecified vitamin D deficiency   . Leiomyoma OF SKIN    INCREASED RISK RENAL CELL CA  NEEDS CT OF KIDNEYS EVERY 2 YEARS NEXT DUE  02/06/13  . Hypertension   . Neuromuscular disorder ([place])   . History of blood transfusion 09/2013    "think so; not sure; related to big OR"  . Osteoarthritis   . Arthritis     "all over my body" (10/16/2013)  . Chronic back pain     "neck to lower back" (10/16/2013)  . Depression     "situational since 09/22/2013 OR"  . Melanoma of back ([place])   . 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) ([place])   . Renal cell carcinoma ([place])     type 2; papillary   Past Surgical History  Procedure Laterality Date  . Anterior cruciate ligament repair Bilateral [id number]    [phone]  . Anterior cervical decomp/discectomy fusion  1992  . Hand ligament reconstruction Right   . Robotic assited partial nephrectomy  05/22/2012    Procedure: ROBOTIC ASSITED PARTIAL NEPHRECTOMY;  Surgeon: Alexis Frock, MD;  Location: [facility];  Service: Urology;  Laterality: Right;  Right Robotic Cyst Decortication and Partial Nephrectomy   . Spine surgery    . Right colectomy Right 09/22/2013  . Cholecystectomy  09/22/2013    "had tumors in it"  . Appendectomy  09/22/2013  .  Removal of fascia overlying the right psoas Right 09/22/2013  . Partial nephrectomy Right 09/22/2013  . Renal artery stent Right 09/22/2013  . Lymph node dissection Right 09/22/2013    "in the area of the kidney; they were clean"  . Harvest bone graft Right 1992    "hip; for neck fusion"  . Inguinal hernia repair Right 2004  . Ligament repair Right ~ 2001    "little finger"  . Melanoma excision  ~ 2007    "off my back"   Social History   Social History  . Marital Status: Married    Spouse Name: N/A  . Number of Children: N/A  . 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 Smoking status: Former Smoker -- 0.50 packs/day for 40 years     Types: Cigarettes  . Smokeless tobacco: Never Used     Comment: Pt chewing Nicorette gum currently  . Alcohol Use: 0.0 oz/week    0 Standard drinks or equivalent per week     Comment: 10/16/2013 "2-3 beers daily generally; nothing last month or so"  . Drug Use: No  . Sexual Activity: Not Currently   Other Topics Concern  . None   Social History Narrative   Walks daily for exercise.   Family History  Problem Relation Age of Onset  . Adopted: Yes  . Hypertension Father    Allergies  Allergen Reactions  . Avelox [Moxifloxacin Hcl In Nacl] Hives, Shortness Of Breath and Swelling  . Moxifloxacin Hives   Prior to Admission medications   Medication Sig Start Date End Date Taking? Authorizing Provider  atorvastatin (LIPITOR) 10 MG tablet TAKE 1 BY MOUTH AT BEDTIME 05/10/15  Yes [name], MD  B Complex-C (B-COMPLEX WITH VITAMIN C) tablet Take 1 tablet by mouth daily.   Yes Historical Provider, MD  cetirizine (ZYRTEC) 10 MG tablet Take 10 mg by mouth daily.   Yes Historical Provider, MD  cholecalciferol (VITAMIN D) 1000 UNITS tablet Take 1,000 Units by mouth daily.   Yes Historical Provider, MD  denosumab (XGEVA) 120 MG/1.7ML SOLN injection Inject 120 mg into the skin once.   Yes Historical Provider, MD  famotidine (PEPCID) 10 MG tablet Take 10 mg by mouth 2 (two) times daily.   Yes Historical Provider, MD  HYDROcodone-acetaminophen (NORCO) 7.5-325 MG tablet 1 tablet every 4-6 hours for pain .prescription can be filled on 08/06/15 07/28/15  Yes [name], MD  LORazepam (ATIVAN) 0.5 MG tablet Take 1 tablet twice a day as needed for stress 06/21/15  Yes [name], MD  losartan (COZAAR) 50 MG tablet TAKE 1 BY MOUTH DAILY BEFORE BREAKFAST 05/10/15  Yes [name], MD  Multiple Vitamin (MULTIVITAMIN WITH MINERALS) TABS tablet Take 1 tablet by mouth daily.   Yes Historical Provider, MD  nicotine polacrilex (NICORETTE) 4 MG gum Take 4 mg by mouth as needed for smoking cessation.   Yes  Historical Provider, MD  ondansetron (ZOFRAN ODT) 4 MG disintegrating tablet Take 1 tablet (4 mg total) by mouth every 8 (eight) hours as needed for nausea or vomiting. 07/28/15  Yes [name], MD  vitamin C (ASCORBIC ACID) 500 MG tablet Take 500 mg by mouth daily.   Yes Historical Provider, MD  zoster vaccine live, PF, (ZOSTAVAX) 60454 UNT/0.65ML injection Inject 19,400 Units into the skin once. 09/22/14  Yes [name], MD     ROS: The  patient denies fevers, chills, palpitations, wheezing, dyspnea on exertion, dysuria, hematuria, melena, numbness, weakness, or tingling.  All other systems have been reviewed and were otherwise negative with the exception of those mentioned in the HPI and as above.    PHYSICAL EXAM: Filed Vitals:   08/17/15 0938  BP: 122/88  Pulse: 85  Temp: 98 F (36.7 C)  Resp: 20   Body mass index is 26.22 kg/(m^2).   General: Alert, no acute distress, cooperative HEENT:  Normocephalic, atraumatic, oropharynx patent. Eye: Juliette Mangle [facility] Cardiovascular:  Regular rate and rhythm, no rubs murmurs or gallops.  No Carotid bruits, radial pulse intact. No pedal edema.  Respiratory: Clear to auscultation bilaterally.  No wheezes, rales, or rhonchi.  No cyanosis, no use of accessory musculature Abdominal: Tenderness right lower abdomen, unchanged from previous.  Musculoskeletal: Tenderness over left superior SI joint. Skin: Few acne pimples on his nose. Neurologic: Facial musculature symmetric. Psychiatric: Patient acts appropriately throughout our interaction. Lymphatic: No cervical or submandibular lymphadenopathy     LABS:    EKG/XRAY:   Primary read interpreted by Dr. Everlene Farrier at [facility].   ASSESSMENT/PLAN: Patient tolerated his first round of chemotherapy well. He did not develop much of a skin rash. He is taking 5-6 hydrocodone per day. He has known metastatic disease intra-abdominal as well as to the skeletal area. He has lesions in the sternum and also in  the left SI joint. He has decreased his use of PPIs and currently only takes Pepcid. Will follow-up [facility]. He continues close follow-up with the oncologist at [facility].I personally performed the services described in this documentation, which was scribed in my presence. The recorded information has been reviewed and is accurate.    Gross sideeffects, risk and benefits, and alternatives of medications d/w patient. Patient is aware that all medications have potential sideeffects and we are unable to predict every sideeffect or drug-drug interaction that may occur.  [name] I refilled his hydrocodone with a second refill of the obtained in approximately 2-1/2 weeks. No other changes in medication. MD 08/17/2015 10:20 AM

## 2015-09-09 ENCOUNTER — Telehealth: Payer: Self-pay | Admitting: Emergency Medicine

## 2015-09-09 ENCOUNTER — Other Ambulatory Visit: Payer: Self-pay | Admitting: Emergency Medicine

## 2015-09-09 DIAGNOSIS — M25512 Pain in left shoulder: Secondary | ICD-10-CM

## 2015-09-09 MED ORDER — HYDROCODONE-ACETAMINOPHEN 7.5-325 MG PO TABS: ORAL_TABLET | ORAL | Status: DC

## 2015-09-09 MED ORDER — ONDANSETRON 4 MG PO TBDP: 4.0000 mg | ORAL_TABLET | Freq: Three times a day (TID) | ORAL | Status: DC | PRN

## 2015-09-09 NOTE — Telephone Encounter (Signed)
Spoke with patient while he was with his wife. He will will use cortisone cream to his acne eruption on his back and continue doxycycline. He was given a refill of Zofran and 2 prescriptions for his hydrocodone. 1 to fill on 09/17/2015 and 1 to fill on 10/02/2015

## 2015-09-23 ENCOUNTER — Other Ambulatory Visit: Payer: Self-pay | Admitting: Emergency Medicine

## 2015-09-23 DIAGNOSIS — M25512 Pain in left shoulder: Secondary | ICD-10-CM

## 2015-09-23 MED ORDER — HYDROCODONE-ACETAMINOPHEN 7.5-325 MG PO TABS: ORAL_TABLET | ORAL | Status: DC

## 2015-10-06 ENCOUNTER — Ambulatory Visit (INDEPENDENT_AMBULATORY_CARE_PROVIDER_SITE_OTHER): Payer: BLUE CROSS/BLUE SHIELD | Admitting: Emergency Medicine

## 2015-10-06 VITALS — BP 122/85 | HR 72 | Temp 98.1°F | Resp 17 | Ht 72.0 in | Wt 193.0 lb

## 2015-10-06 DIAGNOSIS — M25512 Pain in left shoulder: Secondary | ICD-10-CM | POA: Diagnosis not present

## 2015-10-06 DIAGNOSIS — C641 Malignant neoplasm of right kidney, except renal pelvis: Secondary | ICD-10-CM | POA: Diagnosis not present

## 2015-10-06 DIAGNOSIS — R1011 Right upper quadrant pain: Secondary | ICD-10-CM

## 2015-10-06 DIAGNOSIS — F17200 Nicotine dependence, unspecified, uncomplicated: Secondary | ICD-10-CM | POA: Diagnosis not present

## 2015-10-06 DIAGNOSIS — M2550 Pain in unspecified joint: Secondary | ICD-10-CM

## 2015-10-06 MED ORDER — HYDROCODONE-ACETAMINOPHEN 7.5-325 MG PO TABS: ORAL_TABLET | ORAL | Status: DC

## 2015-10-06 NOTE — Progress Notes (Signed)
Patient ID: Douglas [name], male   DOB: [birth year], 60 y.o.   MRN: [id number]    By signing my name below, I, Essence Howell, attest that this documentation has been prepared under the direction and in the presence of [name], MD Electronically Signed: Ladene Artist, ED Scribe 10/06/2015 at 9:55 AM.  Chief Complaint:  Chief Complaint  Patient presents with  . Medication Refill    norco   HPI: Douglas [name] is a 60 y.o. male who reports to [facility] today for a medication refill of Norco. He states that he is going out of the state and will run out of the medication while he is out of town. He needs the medication for 4/28-5/17/17.   Treatments  Pt reports persistent fatigue for the past few days that he suspects is related to his last treatment which was 6 days ago. Pt's next treatment is on 10/14/15.   GERD Pt is currently taking Pepcid around 3 AM daily. He suspects that Tarceva, which he takes at 11 PM daily, is causing increased heartburn.   Disability  Pt was recently approved for long term disability by [employer].   Smoking Pt has cut back to 8 cigarettes daily.   Past Medical History  Diagnosis Date  . Renal calculi   . Compression fracture     S/P L-SPINE; pt does not recall this hx on 10/16/2013  . GERD (gastroesophageal reflux disease)   . History of surgical fusion joint     DDD C-SPINE  . Hemoptysis   . Allergic rhinitis   . Hyperlipidemia   . Encounter for long-term (current) use of other medications   . COPD (chronic obstructive pulmonary disease) (HCC)     FVC .66  . Tobacco use disorder   . Melanoma in situ ([place])   . Unspecified vitamin D deficiency   . Leiomyoma OF SKIN    INCREASED RISK RENAL CELL CA  NEEDS CT OF KIDNEYS EVERY 2 YEARS NEXT DUE  02/06/13  . Hypertension   . Neuromuscular disorder ([place])   . History of blood transfusion 09/2013    "think so; not sure; related to big OR"  . Osteoarthritis   . Arthritis     "all over my body" (10/16/2013)    . Chronic back pain     "neck to lower back" (10/16/2013)  . Depression     "situational since 09/22/2013 OR"  . Melanoma of back ([place])   . 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) ([place])   . Renal cell carcinoma ([place])     type 2; papillary   Past Surgical History  Procedure Laterality Date  . Anterior cruciate ligament repair Bilateral [id number]    [phone]  . Anterior cervical decomp/discectomy fusion  1992  . Hand ligament reconstruction Right   . Robotic assited partial nephrectomy  05/22/2012    Procedure: ROBOTIC ASSITED PARTIAL NEPHRECTOMY;  Surgeon: Alexis Frock, MD;  Location: [facility];  Service: Urology;  Laterality: Right;  Right Robotic Cyst Decortication and Partial Nephrectomy   . Spine surgery    . Right colectomy Right 09/22/2013  . Cholecystectomy  09/22/2013    "had tumors in it"  . Appendectomy  09/22/2013  . Removal of fascia overlying the right psoas Right 09/22/2013  . Partial nephrectomy Right 09/22/2013  . Renal artery stent Right 09/22/2013  . Lymph node dissection Right 09/22/2013    "in the area of the kidney; they were clean"  . Harvest bone graft Right 1992    "  hip; for neck fusion"  . Inguinal hernia repair Right 2004  . Ligament repair Right ~ 2001    "little finger"  . Melanoma excision  ~ 2007    "off my back"   Social History   Social History  . Marital Status: Married    Spouse Name: N/A  . Number of Children: N/A  . 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 Smoking status: Former Smoker -- 0.50 packs/day for 40 years    Types: Cigarettes  . Smokeless tobacco: Never Used     Comment: Pt chewing Nicorette gum currently  . Alcohol Use: 0.0 oz/week    0 Standard drinks or equivalent per week     Comment: 10/16/2013 "2-3 beers daily generally; nothing last month or so"  . Drug Use: No  . Sexual Activity: Not Currently   Other Topics Concern  . None   Social  History Narrative   Walks daily for exercise.   Family History  Problem Relation Age of Onset  . Adopted: Yes  . Hypertension Father    Allergies  Allergen Reactions  . Avelox [Moxifloxacin Hcl In Nacl] Hives, Shortness Of Breath and Swelling  . Moxifloxacin Hives   Prior to Admission medications   Medication Sig Start Date End Date Taking? Authorizing Provider  atorvastatin (LIPITOR) 10 MG tablet TAKE 1 BY MOUTH AT BEDTIME 05/10/15  Yes [name], MD  B Complex-C (B-COMPLEX WITH VITAMIN C) tablet Take 1 tablet by mouth daily.   Yes Historical Provider, MD  calcium carbonate 1250 MG capsule Take 1,250 mg by mouth 2 (two) times daily with a meal.   Yes Historical Provider, MD  cetirizine (ZYRTEC) 10 MG tablet Take 10 mg by mouth daily.   Yes Historical Provider, MD  cholecalciferol (VITAMIN D) 1000 UNITS tablet Take 1,000 Units by mouth daily.   Yes Historical Provider, MD  denosumab (XGEVA) 120 MG/1.7ML SOLN injection Inject 120 mg into the skin once.   Yes Historical Provider, MD  erlotinib (TARCEVA) 100 MG tablet Take 100 mg by mouth daily. Take on an empty stomach 1 hour before meals or 2 hours after   Yes Historical Provider, MD  famotidine (PEPCID) 10 MG tablet Take 10 mg by mouth 2 (two) times daily.   Yes Historical Provider, MD  HYDROcodone-acetaminophen (NORCO) 7.5-325 MG tablet 1 tablet every 4-6 hours for pain .prescription can be filled on 10/01/2015 09/23/15  Yes [name], MD  LORazepam (ATIVAN) 0.5 MG tablet Take 1 tablet twice a day as needed for stress 06/21/15  Yes [name], MD  losartan (COZAAR) 50 MG tablet TAKE 1 BY MOUTH DAILY BEFORE BREAKFAST 05/10/15  Yes [name], MD  Multiple Vitamin (MULTIVITAMIN WITH MINERALS) TABS tablet Take 1 tablet by mouth daily.   Yes Historical Provider, MD  ondansetron (ZOFRAN ODT) 4 MG disintegrating tablet Take 1 tablet (4 mg total) by mouth every 8 (eight) hours as needed for nausea or vomiting. 09/09/15  Yes [name], MD  vitamin C (ASCORBIC ACID) 500 MG tablet Take 500 mg by mouth daily.   Yes Historical Provider, MD  zoster vaccine live, PF, (ZOSTAVAX) 16109 UNT/0.65ML injection Inject 19,400 Units into the skin once. Patient not taking: Reported on 10/06/2015 09/22/14   [name], MD   ROS: The patient denies fevers, chills, night sweats, unintentional weight loss, chest pain, palpitations, wheezing, dyspnea on  exertion, nausea, vomiting, abdominal pain, dysuria, hematuria, melena, numbness, weakness, or tingling.   All other systems have been reviewed and were otherwise negative with the exception of those mentioned in the HPI and as above.    PHYSICAL EXAM: Filed Vitals:   10/06/15 0847  BP: 122/85  Pulse: 72  Temp: 98.1 F (36.7 C)  Resp: 17   Body mass index is 26.17 kg/(m^2).  General: Alert, no acute distress HEENT:  Normocephalic, atraumatic, oropharynx patent. Eye: Juliette Mangle [facility] Cardiovascular:  Regular rate and rhythm, no rubs murmurs or gallops.  No Carotid bruits, radial pulse intact. No pedal edema.  Respiratory: Clear to auscultation bilaterally.  No wheezes, rales, or rhonchi.  No cyanosis, no use of accessory musculature Abdominal: Tenderness in RLQ, unchanged from previous with a large healed scar. Musculoskeletal: Gait intact. No edema, tenderness Skin: No rashes. Neurologic: Facial musculature symmetric. Psychiatric: Patient acts appropriately throughout our interaction. Lymphatic: No cervical or submandibular lymphadenopathy  LABS:  EKG/XRAY:   Primary read interpreted by Dr. Everlene Farrier at [facility].  ASSESSMENT/PLAN: Patient tolerating his chemotherapy well. He is due for treatment next Thursday. His medications were refilled for pain control. He continues to have significant back left shoulder and abdominal discomfort. Recheck on return in about a month.I personally performed the services described in this documentation, which was scribed in my presence. The recorded  information has been reviewed and is accurate.   Gross sideeffects, risk and benefits, and alternatives of medications d/w patient. Patient is aware that all medications have potential sideeffects and we are unable to predict every sideeffect or drug-drug interaction that may occur.  [name] MD 10/06/2015 9:35 AM

## 2015-10-06 NOTE — Patient Instructions (Signed)
     IF you received an x-ray today, you will receive an invoice from [facility]. Please contact [facility] at [phone] with questions or concerns regarding your invoice.   IF you received labwork today, you will receive an invoice from [facility]. Please contact [facility] at [phone] with questions or concerns regarding your invoice.   Our billing staff will not be able to assist you with questions regarding bills from these companies.  You will be contacted with the lab results as soon as they are available. The fastest way to get your results is to activate your My Chart account. Instructions are located on the last page of this paperwork. If you have not heard from us regarding the results in 2 weeks, please contact this office.      

## 2015-10-12 ENCOUNTER — Other Ambulatory Visit: Payer: Self-pay | Admitting: Emergency Medicine

## 2015-10-14 NOTE — Telephone Encounter (Signed)
Faxed

## 2015-10-26 ENCOUNTER — Other Ambulatory Visit: Payer: Self-pay | Admitting: Emergency Medicine

## 2015-10-28 ENCOUNTER — Other Ambulatory Visit: Payer: Self-pay | Admitting: Emergency Medicine

## 2015-10-28 DIAGNOSIS — M25512 Pain in left shoulder: Secondary | ICD-10-CM

## 2015-10-28 MED ORDER — HYDROCODONE-ACETAMINOPHEN 7.5-325 MG PO TABS: ORAL_TABLET | ORAL | Status: DC

## 2015-10-28 NOTE — Telephone Encounter (Signed)
Dr Everlene Farrier, you have seen pt several times lately, but not for HTN or hyperlipidemia, and don't see lipid panel since 2014. Do you want to give RFs, or just one mos w/note to come in?

## 2015-11-17 ENCOUNTER — Ambulatory Visit (INDEPENDENT_AMBULATORY_CARE_PROVIDER_SITE_OTHER): Payer: BLUE CROSS/BLUE SHIELD | Admitting: Emergency Medicine

## 2015-11-17 VITALS — BP 130/82 | HR 76 | Temp 97.9°F | Resp 18 | Ht 72.0 in | Wt 194.0 lb

## 2015-11-17 DIAGNOSIS — C641 Malignant neoplasm of right kidney, except renal pelvis: Secondary | ICD-10-CM

## 2015-11-17 DIAGNOSIS — C799 Secondary malignant neoplasm of unspecified site: Secondary | ICD-10-CM

## 2015-11-17 DIAGNOSIS — F17200 Nicotine dependence, unspecified, uncomplicated: Secondary | ICD-10-CM

## 2015-11-17 DIAGNOSIS — M25512 Pain in left shoulder: Secondary | ICD-10-CM

## 2015-11-17 DIAGNOSIS — C801 Malignant (primary) neoplasm, unspecified: Secondary | ICD-10-CM | POA: Diagnosis not present

## 2015-11-17 DIAGNOSIS — R110 Nausea: Secondary | ICD-10-CM | POA: Diagnosis not present

## 2015-11-17 DIAGNOSIS — R1011 Right upper quadrant pain: Secondary | ICD-10-CM

## 2015-11-17 MED ORDER — HYDROCODONE-ACETAMINOPHEN 7.5-325 MG PO TABS: ORAL_TABLET | ORAL | Status: DC

## 2015-11-17 MED ORDER — SCOPOLAMINE 1 MG/3DAYS TD PT72: 1.0000 | MEDICATED_PATCH | TRANSDERMAL | Status: DC

## 2015-11-17 NOTE — Patient Instructions (Addendum)
Please call me this weekend and we will pick a time next week to have coffee and talk.    IF you received an x-ray today, you will receive an invoice from [facility]. Please contact [facility] at [phone] with questions or concerns regarding your invoice.   IF you received labwork today, you will receive an invoice from [facility]. Please contact [facility] at [phone] with questions or concerns regarding your invoice.   Our billing staff will not be able to assist you with questions regarding bills from these companies.  You will be contacted with the lab results as soon as they are available. The fastest way to get your results is to activate your My Chart account. Instructions are located on the last page of this paperwork. If you have not heard from [place] regarding the results in 2 weeks, please contact this office.

## 2015-11-17 NOTE — Progress Notes (Signed)
Patient ID: Douglas [name], male   DOB: [birth year], 60 y.o.   MRN: [id number]    By signing my name below, I, Essence Howell, attest that this documentation has been prepared under the direction and in the presence of [name], MD Electronically Signed: Ladene Artist, ED Scribe 11/17/2015 at 9:41 AM.  Chief Complaint:  Chief Complaint  Patient presents with  . Medication Refill    hydrocodone   HPI: Douglas [name] is a 60 y.o. male, with a h/o leiomyomatosis and renal cell carcinoma, who reports to [facility] today for a medication refill of hydrocodone.   Fatigue Pt is still experiencing unchanged fatigue which he has noticed is exacerbated in the sun. Despite fatigue, he has a big "bucket list" trip planned for the end of June which includes the TV show [place]. He plans to go bass fishing for 4-5 hours on the boat.   R Knee Pt reports gradually worsening right knee pain for several weeks. He states that pain feels similar to when he tore his patellar tendon. He has tried applying a knee sleeve with minimal relief. Pt requests a prescription for pain at this visit.     Cancer Pt saw Dr. Loletha Grayer yesterday. He states that the scans show that every spot has decreased in size except a new spot on the liver. Pt states that he has not had a cigarette in a week. He hopes that the spot on his liver will clear up with his smoking cessation.   Group Therapy Pt attends group therapy every second Tuesday of every month. He also goes to hospice counseling with [name].    Past Medical History  Diagnosis Date  . Renal calculi   . Compression fracture     S/P L-SPINE; pt does not recall this hx on 10/16/2013  . GERD (gastroesophageal reflux disease)   . History of surgical fusion joint     DDD C-SPINE  . Hemoptysis   . Allergic rhinitis   . Hyperlipidemia   . Encounter for long-term (current) use of other medications   . COPD (chronic obstructive pulmonary disease) (HCC)     FVC .66  .  Tobacco use disorder   . Melanoma in situ ([place])   . Unspecified vitamin D deficiency   . Leiomyoma OF SKIN    INCREASED RISK RENAL CELL CA  NEEDS CT OF KIDNEYS EVERY 2 YEARS NEXT DUE  02/06/13  . Hypertension   . Neuromuscular disorder ([place])   . History of blood transfusion 09/2013    "think so; not sure; related to big OR"  . Osteoarthritis   . Arthritis     "all over my body" (10/16/2013)  . Chronic back pain     "neck to lower back" (10/16/2013)  . Depression     "situational since 09/22/2013 OR"  . Melanoma of back ([place])   . 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 Renal cell carcinoma ([place])     type 2; papillary   Past Surgical History  Procedure Laterality Date  . Anterior cruciate ligament repair Bilateral [id number]    [id number]  . Anterior cervical decomp/discectomy fusion  1992  . Hand ligament reconstruction Right   . Robotic assited partial nephrectomy  05/22/2012    Procedure: ROBOTIC ASSITED PARTIAL NEPHRECTOMY;  Surgeon: Alexis Frock, MD;  Location: [facility];  Service: Urology;  Laterality: Right;  Right Robotic Cyst Decortication and Partial Nephrectomy   . Spine surgery    . Right  colectomy Right 09/22/2013  . Cholecystectomy  09/22/2013    "had tumors in it"  . Appendectomy  09/22/2013  . Removal of fascia overlying the right psoas Right 09/22/2013  . Partial nephrectomy Right 09/22/2013  . Renal artery stent Right 09/22/2013  . Lymph node dissection Right 09/22/2013    "in the area of the kidney; they were clean"  . Harvest bone graft Right 1992    "hip; for neck fusion"  . Inguinal hernia repair Right 2004  . Ligament repair Right ~ 2001    "little finger"  . Melanoma excision  ~ 2007    "off my back"   Social History   Social History  . Marital Status: Married    Spouse Name: N/A  . Number of Children: N/A  . 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 Smoking  status: Former Smoker -- 0.50 packs/day for 40 years    Types: Cigarettes  . Smokeless tobacco: Never Used     Comment: Pt chewing Nicorette gum currently  . Alcohol Use: 0.0 oz/week    0 Standard drinks or equivalent per week     Comment: 10/16/2013 "2-3 beers daily generally; nothing last month or so"  . Drug Use: No  . Sexual Activity: Not Currently   Other Topics Concern  . None   Social History Narrative   Walks daily for exercise.   Family History  Problem Relation Age of Onset  . Adopted: Yes  . Hypertension Father    Allergies  Allergen Reactions  . Avelox [Moxifloxacin Hcl In Nacl] Hives, Shortness Of Breath and Swelling  . Moxifloxacin Hives   Prior to Admission medications   Medication Sig Start Date End Date Taking? Authorizing Provider  Bevacizumab (AVASTIN) 100 MG/4ML SOLN Inject into the vein.   Yes Historical Provider, MD  buPROPion (WELLBUTRIN XL) 150 MG 24 hr tablet Take 150 mg by mouth daily.   Yes Historical Provider, MD  atorvastatin (LIPITOR) 10 MG tablet TAKE 1 BY MOUTH AT BEDTIME 10/28/15   [name], MD  B Complex-C (B-COMPLEX WITH VITAMIN C) tablet Take 1 tablet by mouth daily.    Historical Provider, MD  calcium carbonate 1250 MG capsule Take 1,250 mg by mouth 2 (two) times daily with a meal.    Historical Provider, MD  cetirizine (ZYRTEC) 10 MG tablet Take 10 mg by mouth daily.    Historical Provider, MD  cholecalciferol (VITAMIN D) 1000 UNITS tablet Take 1,000 Units by mouth daily.    Historical Provider, MD  denosumab (XGEVA) 120 MG/1.7ML SOLN injection Inject 120 mg into the skin once.    Historical Provider, MD  erlotinib (TARCEVA) 100 MG tablet Take 100 mg by mouth daily. Take on an empty stomach 1 hour before meals or 2 hours after    Historical Provider, MD  famotidine (PEPCID) 10 MG tablet Take 10 mg by mouth 2 (two) times daily.    Historical Provider, MD  HYDROcodone-acetaminophen (NORCO) 7.5-325 MG tablet 1 tablet every 4-6 hours for pain  .prescription can be filled on 11/03/15 10/28/15   [name], MD  LORazepam (ATIVAN) 0.5 MG tablet take 1 tablet by mouth twice a day AS NEEDED FOR STRESS. 10/13/15   [name], MD  losartan (COZAAR) 50 MG tablet TAKE 1 BY MOUTH DAILY BEFORE BREAKFAST 10/28/15   [name], MD  Multiple Vitamin (MULTIVITAMIN WITH MINERALS) TABS tablet Take  1 tablet by mouth daily.    Historical Provider, MD  ondansetron (ZOFRAN ODT) 4 MG disintegrating tablet Take 1 tablet (4 mg total) by mouth every 8 (eight) hours as needed for nausea or vomiting. 09/09/15   [name], MD  vitamin C (ASCORBIC ACID) 500 MG tablet Take 500 mg by mouth daily.    Historical Provider, MD  zoster vaccine live, PF, (ZOSTAVAX) 19147 UNT/0.65ML injection Inject 19,400 Units into the skin once. Patient not taking: Reported on 10/06/2015 09/22/14   [name], MD   ROS: The patient denies fevers, chills, night sweats, unintentional weight loss, chest pain, palpitations, wheezing, dyspnea on exertion, nausea, vomiting, abdominal pain, dysuria, hematuria, melena, numbness, weakness, or tingling.   All other systems have been reviewed and were otherwise negative with the exception of those mentioned in the HPI and as above.    PHYSICAL EXAM: Filed Vitals:   11/17/15 0854  BP: 130/82  Pulse: 76  Temp: 97.9 F (36.6 C)  Resp: 18   Body mass index is 26.31 kg/(m^2).  General: Alert, no acute distress. Tearful HEENT:  Normocephalic, atraumatic, oropharynx patent. Eye: Juliette Mangle [facility] Cardiovascular:  Regular rate and rhythm, no rubs murmurs or gallops. No Carotid bruits, radial pulse intact. No pedal edema.  Respiratory: Clear to auscultation bilaterally. No wheezes, rales, or rhonchi. No cyanosis, no use of accessory musculature Abdominal: No organomegaly, abdomen is soft. Positive bowel sounds. Healed-scar midline mid abdomen. Tenderness above abdominal scar Musculoskeletal: Gait intact. No edema, tenderness.  Skin: No  rashes. Neurologic: Facial musculature symmetric. Psychiatric: Patient acts appropriately throughout our interaction. Lymphatic: No cervical or submandibular lymphadenopathy  LABS:  EKG/XRAY:   Primary read interpreted by Dr. Everlene Farrier at [facility].  ASSESSMENT/PLAN: On his last visit to [facility] there was a new lesion seen in the liver. The plan is for him to continue his current chemotherapy at [facility]. He has his oncologist at [facility] he sees in [facility]. He has a better relationship with him. He is not smoking. I did write him for some Transderm-Scop to have for an upcoming trip. I also refilled his pain medication. I will see him next week just talk some and make sure he is doing okay at home with his depression.I personally performed the services described in this documentation, which was scribed in my presence. The recorded information has been reviewed and is accurate.    Gross sideeffects, risk and benefits, and alternatives of medications d/w patient. Patient is aware that all medications have potential sideeffects and we are unable to predict every sideeffect or drug-drug interaction that may occur.  [name] MD 11/17/2015 9:23 AM

## 2015-12-02 ENCOUNTER — Other Ambulatory Visit: Payer: Self-pay | Admitting: Emergency Medicine

## 2015-12-02 DIAGNOSIS — M25512 Pain in left shoulder: Secondary | ICD-10-CM

## 2015-12-02 MED ORDER — HYDROCODONE-ACETAMINOPHEN 7.5-325 MG PO TABS: ORAL_TABLET | ORAL | Status: DC

## 2015-12-10 ENCOUNTER — Other Ambulatory Visit: Payer: Self-pay | Admitting: Emergency Medicine

## 2015-12-11 ENCOUNTER — Telehealth: Payer: Self-pay

## 2015-12-11 NOTE — Telephone Encounter (Signed)
Called in Ativan rx to [facility] on [address]. Pt aware rx has been called in.

## 2015-12-20 ENCOUNTER — Ambulatory Visit (INDEPENDENT_AMBULATORY_CARE_PROVIDER_SITE_OTHER): Payer: BLUE CROSS/BLUE SHIELD | Admitting: Emergency Medicine

## 2015-12-20 VITALS — BP 126/80 | HR 62 | Temp 97.9°F | Resp 18 | Ht 72.0 in | Wt 192.0 lb

## 2015-12-20 DIAGNOSIS — M899 Disorder of bone, unspecified: Secondary | ICD-10-CM

## 2015-12-20 DIAGNOSIS — R110 Nausea: Secondary | ICD-10-CM | POA: Diagnosis not present

## 2015-12-20 DIAGNOSIS — M25512 Pain in left shoulder: Secondary | ICD-10-CM

## 2015-12-20 DIAGNOSIS — M25561 Pain in right knee: Secondary | ICD-10-CM | POA: Diagnosis not present

## 2015-12-20 MED ORDER — ONDANSETRON 4 MG PO TBDP
4.0000 mg | ORAL_TABLET | Freq: Once | ORAL | Status: AC
  Administered 2015-12-20: 4 mg via ORAL

## 2015-12-20 MED ORDER — HYDROCODONE-ACETAMINOPHEN 7.5-325 MG PO TABS: ORAL_TABLET | ORAL | Status: DC

## 2015-12-20 NOTE — Progress Notes (Signed)
By signing my name below, I, Douglas [name], attest that this documentation has been prepared under the direction and in the presence of Douglas Queen, MD.  Electronically Signed: Thea [name], ED Scribe. 12/20/2015. 10:38 AM.  Chief Complaint:  Chief Complaint  Patient presents with  . Medication Refill    HYDROCODONE    HPI: Douglas [name] is a 60 y.o. male who reports to [facility] today for a medication refill. Pt has metastatic renal cancer. He is undergoing chemotherapy at [facility]. He is on 1 infusion every two weeks and oral chemo daily. States treatment makes him fatigued and nauseous; feeling nauseous at this time. He plans to follow up with hospice counselor. He is trying to file disability through [place] and has brought forms to have filled.   Pt also complains of right great toe pain that began 2 days ago. He suspects he had an ingrown toe nail so he cut his nail down with some relief to pain. Pt has cut back on smoking.   Past Medical History  Diagnosis Date  . Renal calculi   . Compression fracture     S/P L-SPINE; pt does not recall this hx on 10/16/2013  . GERD (gastroesophageal reflux disease)   . History of surgical fusion joint     DDD C-SPINE  . Hemoptysis   . Allergic rhinitis   . Hyperlipidemia   . Encounter for long-term (current) use of other medications   . COPD (chronic obstructive pulmonary disease) (HCC)     FVC .66  . Tobacco use disorder   . Melanoma in situ (Douglas [name])   . Unspecified vitamin D deficiency   . Leiomyoma OF SKIN    INCREASED RISK RENAL CELL CA  NEEDS CT OF KIDNEYS EVERY 2 YEARS NEXT DUE  02/06/13  . Hypertension   . Neuromuscular disorder ([place])   . History of blood transfusion 09/2013    "think so; not sure; related to big OR"  . Osteoarthritis   . Arthritis     "all over my body" (10/16/2013)  . Chronic back pain     "neck to lower back" (10/16/2013)  . Depression     "situational since 09/22/2013 OR"  . Melanoma of back ([place])    . 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 Renal cell carcinoma (Douglas [name])     type 2; papillary   Past Surgical History  Procedure Laterality Date  . Anterior cruciate ligament repair Bilateral [id number]    [phone]  . Anterior cervical decomp/discectomy fusion  1992  . Hand ligament reconstruction Right   . Robotic assited partial nephrectomy  05/22/2012    Procedure: ROBOTIC ASSITED PARTIAL NEPHRECTOMY;  Surgeon: Alexis Frock, MD;  Location: [facility];  Service: Urology;  Laterality: Right;  Right Robotic Cyst Decortication and Partial Nephrectomy   . Spine surgery    . Right colectomy Right 09/22/2013  . Cholecystectomy  09/22/2013    "had tumors in it"  . Appendectomy  09/22/2013  . Removal of fascia overlying the right psoas Right 09/22/2013  . Partial nephrectomy Right 09/22/2013  . Renal artery stent Right 09/22/2013  . Lymph node dissection Right 09/22/2013    "in the area of the kidney; they were clean"  . Harvest bone graft Right 1992    "hip; for neck fusion"  . Inguinal hernia repair Right 2004  . Ligament repair Right ~ 2001    "little finger"  . Melanoma excision  ~ 2007    "  off my back"   Social History   Social History  . Marital Status: Married    Spouse Name: Douglas [name]  . Number of Children: Douglas [name]  . Years of Education: Douglas [name]   Occupational History  . 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 Smoking status: Former Smoker -- 0.50 packs/day for 40 years    Types: Cigarettes  . Smokeless tobacco: Never Used     Comment: Pt chewing Nicorette gum currently  . Alcohol Use: 0.0 oz/week    0 Standard drinks or equivalent per week     Comment: 10/16/2013 "2-3 beers daily generally; nothing last month or so"  . Drug Use: No  . Sexual Activity: Not Currently   Other Topics Concern  . None   Social History Narrative   Walks daily for exercise.   Family History  Problem Relation Age of Onset  . Adopted: Yes  . Hypertension Father     Allergies  Allergen Reactions  . Avelox [Moxifloxacin Hcl In Nacl] Hives, Shortness Of Breath and Swelling  . Moxifloxacin Hives   Prior to Admission medications   Medication Sig Start Date End Date Taking? Authorizing Provider  atorvastatin (LIPITOR) 10 MG tablet TAKE 1 BY MOUTH AT BEDTIME 10/28/15  Yes [name], MD  B Complex-C (B-COMPLEX WITH VITAMIN C) tablet Take 1 tablet by mouth daily.   Yes Historical Provider, MD  Bevacizumab (AVASTIN) 100 MG/4ML SOLN Inject into the vein.   Yes Historical Provider, MD  buPROPion (WELLBUTRIN XL) 150 MG 24 hr tablet Take 150 mg by mouth daily.   Yes Historical Provider, MD  calcium carbonate 1250 MG capsule Take 1,250 mg by mouth 2 (two) times daily with a meal.   Yes Historical Provider, MD  cetirizine (ZYRTEC) 10 MG tablet Take 10 mg by mouth daily.   Yes Historical Provider, MD  cholecalciferol (VITAMIN D) 1000 UNITS tablet Take 1,000 Units by mouth daily.   Yes Historical Provider, MD  denosumab (XGEVA) 120 MG/1.7ML SOLN injection Inject 120 mg into the skin once.   Yes Historical Provider, MD  erlotinib (TARCEVA) 100 MG tablet Take 100 mg by mouth daily. Take on an empty stomach 1 hour before meals or 2 hours after   Yes Historical Provider, MD  HYDROcodone-acetaminophen (NORCO) 7.5-325 MG tablet 1 tablet every 4-6 hours for pain .prescription can be filled on 6\19\2017 12/02/15  Yes [name], MD  LORazepam (ATIVAN) 0.5 MG tablet take 1 tablet by mouth twice a day AS NEEDED FOR STRESS. 12/11/15  Yes [name], MD  losartan (COZAAR) 50 MG tablet TAKE 1 BY MOUTH DAILY BEFORE BREAKFAST 10/28/15  Yes [name], MD  Multiple Vitamin (MULTIVITAMIN WITH MINERALS) TABS tablet Take 1 tablet by mouth daily.   Yes Historical Provider, MD  ondansetron (ZOFRAN ODT) 4 MG disintegrating tablet Take 1 tablet (4 mg total) by mouth every 8 (eight) hours as needed for nausea or vomiting. 09/09/15  Yes [name], MD  scopolamine (TRANSDERM-SCOP,  1.5 MG,) 1 MG/3DAYS Place 1 patch (1.5 mg total) onto the skin every 3 (three) days. 11/17/15  Yes [name], MD  vitamin C (ASCORBIC ACID) 500 MG tablet Take 500 mg by mouth daily.   Yes Historical Provider, MD  zoster vaccine live, PF, (ZOSTAVAX) 16109 UNT/0.65ML injection Inject 19,400 Units into the skin once. 09/22/14  Yes [name], MD     ROS: The patient denies fevers, chills, night sweats, unintentional  weight loss, chest pain, palpitations, wheezing, dyspnea on exertion, nausea, vomiting, abdominal pain, dysuria, hematuria, melena, numbness, weakness, or tingling.   All other systems have been reviewed and were otherwise negative with the exception of those mentioned in the HPI and as above.    PHYSICAL EXAM: Filed Vitals:   12/20/15 1035  BP: 126/80  Pulse: 62  Temp: 97.9 F (36.6 C)  Resp: 18   Body mass index is 26.03 kg/(m^2).  General: Alert, no acute distress HEENT:  Normocephalic, atraumatic, oropharynx patent. Eye: Juliette Mangle [facility] Cardiovascular:  Regular rate and rhythm, no rubs murmurs or gallops.  No Carotid bruits, radial pulse intact. No pedal edema.  Respiratory: Clear to auscultation bilaterally.  No wheezes, rales, or rhonchi.  No cyanosis, no use of accessory musculature Abdominal: No organomegaly, abdomen is soft and non-tender, positive bowel sounds.  No masses. Musculoskeletal: Gait intact. No edema, tenderness Skin: No rashes. Neurologic: Facial musculature symmetric. Psychiatric: Patient acts appropriately throughout our interaction. Lymphatic: No cervical or submandibular lymphadenopathy   LABS:    EKG/XRAY:   Primary read interpreted by Dr. Everlene Farrier at [facility].   ASSESSMENT/PLAN: Patient is doing well with his treatment program. He takes a daily oral chemotherapeutic drugs along with infusions every 2 weeks. Every month he gets a secondary infusion. He goes to [facility] for his treatment and this is working well. He also has an oncologist that  follows him to [facility]. 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 No change in medication. His pain medications were refilled. Referral made to pain management so there will be someone to write for his pain medications when I retire.I personally performed the services described in this documentation, which was scribed in my presence. The recorded information has been reviewed and is accurate.   Gross sideeffects, risk and benefits, and alternatives of medications d/w patient. Patient is aware that all medications have potential sideeffects and we are unable to predict every sideeffect or drug-drug interaction that may occur.  Douglas Queen MD 12/20/2015 10:37 AM

## 2015-12-20 NOTE — Addendum Note (Signed)
Addended by: [name] A on: 12/20/2015 11:04 AM   Modules accepted: Orders

## 2015-12-20 NOTE — Patient Instructions (Signed)
     IF you received an x-ray today, you will receive an invoice from [facility]. Please contact [facility] at [phone] with questions or concerns regarding your invoice.   IF you received labwork today, you will receive an invoice from [facility]. Please contact [facility] at [phone] with questions or concerns regarding your invoice.   Our billing staff will not be able to assist you with questions regarding bills from these companies.  You will be contacted with the lab results as soon as they are available. The fastest way to get your results is to activate your My Chart account. Instructions are located on the last page of this paperwork. If you have not heard from us regarding the results in 2 weeks, please contact this office.      

## 2015-12-30 ENCOUNTER — Other Ambulatory Visit: Payer: Self-pay | Admitting: Emergency Medicine

## 2015-12-30 DIAGNOSIS — M25512 Pain in left shoulder: Secondary | ICD-10-CM

## 2015-12-30 MED ORDER — HYDROCODONE-ACETAMINOPHEN 7.5-325 MG PO TABS: ORAL_TABLET | ORAL | Status: DC

## 2015-12-31 ENCOUNTER — Other Ambulatory Visit: Payer: Self-pay | Admitting: Emergency Medicine

## 2016-01-05 NOTE — Telephone Encounter (Signed)
Faxed Rx for Lorazepam.

## 2016-01-08 ENCOUNTER — Ambulatory Visit (INDEPENDENT_AMBULATORY_CARE_PROVIDER_SITE_OTHER): Payer: BLUE CROSS/BLUE SHIELD | Admitting: Emergency Medicine

## 2016-01-08 ENCOUNTER — Encounter: Payer: Self-pay | Admitting: Emergency Medicine

## 2016-01-08 VITALS — BP 116/84 | HR 97 | Temp 98.7°F | Resp 16 | Ht 72.0 in | Wt 192.0 lb

## 2016-01-08 DIAGNOSIS — C801 Malignant (primary) neoplasm, unspecified: Secondary | ICD-10-CM

## 2016-01-08 DIAGNOSIS — IMO0001 Reserved for inherently not codable concepts without codable children: Secondary | ICD-10-CM

## 2016-01-08 DIAGNOSIS — D481 Neoplasm of uncertain behavior of connective and other soft tissue: Secondary | ICD-10-CM

## 2016-01-08 DIAGNOSIS — C641 Malignant neoplasm of right kidney, except renal pelvis: Secondary | ICD-10-CM | POA: Diagnosis not present

## 2016-01-08 DIAGNOSIS — R030 Elevated blood-pressure reading, without diagnosis of hypertension: Secondary | ICD-10-CM

## 2016-01-08 DIAGNOSIS — C799 Secondary malignant neoplasm of unspecified site: Secondary | ICD-10-CM

## 2016-01-08 DIAGNOSIS — R000 Tachycardia, unspecified: Secondary | ICD-10-CM

## 2016-01-08 LAB — POCT CBC
GRANULOCYTE PERCENT: 74.1 % (ref 37–80)
HEMATOCRIT: 42.9 % — AB (ref 43.5–53.7)
Hemoglobin: 15.3 g/dL (ref 14.1–18.1)
LYMPH, POC: 2.4 (ref 0.6–3.4)
MCH, POC: 32.6 pg — AB (ref 27–31.2)
MCHC: 35.6 g/dL — AB (ref 31.8–35.4)
MCV: 91.3 fL (ref 80–97)
MID (cbc): 0.5 (ref 0–0.9)
MPV: 6.8 fL (ref 0–99.8)
POC GRANULOCYTE: 8.4 — AB (ref 2–6.9)
POC LYMPH %: 21.6 % (ref 10–50)
POC MID %: 4.3 % (ref 0–12)
Platelet Count, POC: 262 10*3/uL (ref 142–424)
RBC: 4.69 M/uL (ref 4.69–6.13)
RDW, POC: 12.9 %
WBC: 11.3 10*3/uL — AB (ref 4.6–10.2)

## 2016-01-08 LAB — BASIC METABOLIC PANEL WITH GFR
BUN: 19 mg/dL (ref 7–25)
CALCIUM: 9.7 mg/dL (ref 8.6–10.3)
CHLORIDE: 104 mmol/L (ref 98–110)
CO2: 19 mmol/L — AB (ref 20–31)
CREATININE: 1.55 mg/dL — AB (ref 0.70–1.25)
GFR, Est African American: 55 mL/min — ABNORMAL LOW (ref >=60)
GFR, Est Non African American: 48 mL/min — ABNORMAL LOW (ref >=60)
GLUCOSE: 113 mg/dL — AB (ref 65–99)
Potassium: 3.6 mmol/L (ref 3.5–5.3)
SODIUM: 136 mmol/L (ref 135–146)

## 2016-01-08 MED ORDER — AMLODIPINE BESYLATE 2.5 MG PO TABS: 2.5000 mg | ORAL_TABLET | Freq: Every day | ORAL | Status: DC

## 2016-01-08 NOTE — Patient Instructions (Signed)
     IF you received an x-ray today, you will receive an invoice from [facility]. Please contact [facility] at [phone] with questions or concerns regarding your invoice.   IF you received labwork today, you will receive an invoice from [facility]. Please contact [facility] at [phone] with questions or concerns regarding your invoice.   Our billing staff will not be able to assist you with questions regarding bills from these companies.  You will be contacted with the lab results as soon as they are available. The fastest way to get your results is to activate your My Chart account. Instructions are located on the last page of this paperwork. If you have not heard from us regarding the results in 2 weeks, please contact this office.      

## 2016-01-08 NOTE — Progress Notes (Signed)
Pt here with sudden Tachycardia that started last night around 10pm after Chemo infusion. Denies chest pain or feeling faint,dizzy. States, Losartan medication increased 2 weeks ago from 50 to 75 mg. VSS

## 2016-01-08 NOTE — Progress Notes (Addendum)
Patient ID: Douglas [name], male   DOB: 1956/01/11, 60 y.o.   MRN: [id number]    By signing my name below, I, Essence Howell, attest that this documentation has been prepared under the direction and in the presence of [name], MD Electronically Signed: Ladene Artist, ED Scribe 01/08/2016 at 2:28 PM.  Chief Complaint:  Chief Complaint  Patient presents with  . Tachycardia    Losartan medication increased 2 weeks ago   HPI: Douglas [name] is a 60 y.o. male who reports to [facility] today complaining of increased heart rate that started around 10 PM last night. Pt states that his Cozaar was increased from 50 mg to 75 mg 2 weeks ago. He also reports driving back home 2 days ago and receiving a Tarceva injection and Xgeva avastin injection for chemotherapy 2 days ago as well. Pt's triage Pulse: 97 and triage BP: 116/84. Repeat BP: 120/84. Pt states that he consumed 5-6 bottles of water today while ding yard work. He denies chest pain or SOB.    Past Medical History  Diagnosis Date  . Renal calculi   . Compression fracture     S/P L-SPINE; pt does not recall this hx on 10/16/2013  . GERD (gastroesophageal reflux disease)   . History of surgical fusion joint     DDD C-SPINE  . Hemoptysis   . Allergic rhinitis   . Hyperlipidemia   . Encounter for long-term (current) use of other medications   . COPD (chronic obstructive pulmonary disease) (HCC)     FVC .66  . Tobacco use disorder   . Melanoma in situ ([place])   . Unspecified vitamin D deficiency   . Leiomyoma OF SKIN    INCREASED RISK RENAL CELL CA  NEEDS CT OF KIDNEYS EVERY 2 YEARS NEXT DUE  02/06/13  . Hypertension   . Neuromuscular disorder ([place])   . History of blood transfusion 09/2013    "think so; not sure; related to big OR"  . Osteoarthritis   . Arthritis     "all over my body" (10/16/2013)  . Chronic back pain     "neck to lower back" (10/16/2013)  . Depression     "situational since 09/22/2013 OR"  . Melanoma of back ([place])     . 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 Renal cell carcinoma ([place])     type 2; papillary   Past Surgical History  Procedure Laterality Date  . Anterior cruciate ligament repair Bilateral [id number]    [id number]  . Anterior cervical decomp/discectomy fusion  1992  . Hand ligament reconstruction Right   . Robotic assited partial nephrectomy  05/22/2012    Procedure: ROBOTIC ASSITED PARTIAL NEPHRECTOMY;  Surgeon: Alexis Frock, MD;  Location: [facility];  Service: Urology;  Laterality: Right;  Right Robotic Cyst Decortication and Partial Nephrectomy   . Spine surgery    . Right colectomy Right 09/22/2013  . Cholecystectomy  09/22/2013    "had tumors in it"  . Appendectomy  09/22/2013  . Removal of fascia overlying the right psoas Right 09/22/2013  . Partial nephrectomy Right 09/22/2013  . Renal artery stent Right 09/22/2013  . Lymph node dissection Right 09/22/2013    "in the area of the kidney; they were clean"  . Harvest bone graft Right 1992    "hip; for neck fusion"  . Inguinal hernia repair Right 2004  . Ligament repair Right ~ 2001    "little finger"  . Melanoma excision  ~  2007    "off my back"   Social History   Social History  . Marital Status: Married    Spouse Name: N/A  . Number of Children: N/A  . 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 Smoking status: Former Smoker -- 0.50 packs/day for 40 years    Types: Cigarettes  . Smokeless tobacco: Never Used     Comment: Pt chewing Nicorette gum currently  . Alcohol Use: 0.0 oz/week    0 Standard drinks or equivalent per week     Comment: 10/16/2013 "2-3 beers daily generally; nothing last month or so"  . Drug Use: No  . Sexual Activity: Not Currently   Other Topics Concern  . Not on file   Social History Narrative   Walks daily for exercise.   Family History  Problem Relation Age of Onset  . Adopted: Yes  . Hypertension  Father    Allergies  Allergen Reactions  . Avelox [Moxifloxacin Hcl In Nacl] Hives, Shortness Of Breath and Swelling  . Moxifloxacin Hives   Prior to Admission medications   Medication Sig Start Date End Date Taking? Authorizing Provider  atorvastatin (LIPITOR) 10 MG tablet TAKE 1 BY MOUTH AT BEDTIME 10/28/15   [name], MD  B Complex-C (B-COMPLEX WITH VITAMIN C) tablet Take 1 tablet by mouth daily.    Historical Provider, MD  Bevacizumab (AVASTIN) 100 MG/4ML SOLN Inject into the vein.    Historical Provider, MD  buPROPion (WELLBUTRIN XL) 150 MG 24 hr tablet Take 150 mg by mouth daily.    Historical Provider, MD  calcium carbonate 1250 MG capsule Take 1,250 mg by mouth 2 (two) times daily with a meal.    Historical Provider, MD  cetirizine (ZYRTEC) 10 MG tablet Take 10 mg by mouth daily.    Historical Provider, MD  cholecalciferol (VITAMIN D) 1000 UNITS tablet Take 1,000 Units by mouth daily.    Historical Provider, MD  denosumab (XGEVA) 120 MG/1.7ML SOLN injection Inject 120 mg into the skin once.    Historical Provider, MD  erlotinib (TARCEVA) 100 MG tablet Take 100 mg by mouth daily. Take on an empty stomach 1 hour before meals or 2 hours after    Historical Provider, MD  HYDROcodone-acetaminophen (NORCO) 7.5-325 MG tablet 1 tablet every 4-6 hours for pain .prescription can be filled on 7\20\2017 12/30/15   [name], MD  LORazepam (ATIVAN) 0.5 MG tablet take 1 tablet by mouth twice a day AS NEEDED FOR STRESS. 01/05/16   [name], MD  losartan (COZAAR) 50 MG tablet TAKE 1 BY MOUTH DAILY BEFORE BREAKFAST 10/28/15   [name], MD  Multiple Vitamin (MULTIVITAMIN WITH MINERALS) TABS tablet Take 1 tablet by mouth daily.    Historical Provider, MD  ondansetron (ZOFRAN ODT) 4 MG disintegrating tablet Take 1 tablet (4 mg total) by mouth every 8 (eight) hours as needed for nausea or vomiting. 09/09/15   [name], MD  scopolamine (TRANSDERM-SCOP, 1.5 MG,) 1 MG/3DAYS Place 1 patch  (1.5 mg total) onto the skin every 3 (three) days. 11/17/15   [name], MD  vitamin C (ASCORBIC ACID) 500 MG tablet Take 500 mg by mouth daily.    Historical Provider, MD  zoster vaccine live, PF, (ZOSTAVAX) 91478 UNT/0.65ML injection Inject 19,400 Units into the skin once. 09/22/14   [name], MD   ROS: The patient denies fevers,  chills, night sweats, unintentional weight loss, palpitations, wheezing, nausea, vomiting, abdominal pain, dysuria, hematuria, melena, numbness, weakness, or tingling.   All other systems have been reviewed and were otherwise negative with the exception of those mentioned in the HPI and as above.    PHYSICAL EXAM: Filed Vitals:   01/08/16 1411  BP: 116/84  Pulse: 97  Temp: 98.7 F (37.1 C)  Resp: 16   Body mass index is 26.03 kg/(m^2).  General: Alert, no acute distress HEENT:  Normocephalic, atraumatic, oropharynx patent. Acne eruption on face Eye: E[facility], [facility] Cardiovascular:  Regular rate and rhythm, no rubs murmurs or gallops.  No Carotid bruits, radial pulse intact. No pedal edema. Normal EKG. No ST changes  Respiratory: Clear to auscultation bilaterally.  No wheezes, rales, or rhonchi.  No cyanosis, no use of accessory musculature Abdominal: No organomegaly, abdomen is soft and non-tender, positive bowel sounds.  No masses. Musculoskeletal: Gait intact. No edema, tenderness Skin: No rashes. Neurologic: Facial musculature symmetric. Psychiatric: Patient acts appropriately throughout our interaction. Lymphatic: No cervical or submandibular lymphadenopathy  LABS: Results for orders placed or performed in visit on 01/08/16  POCT CBC  Result Value Ref Range   WBC 11.3 (A) 4.6 - 10.2 K/uL   Lymph, poc 2.4 0.6 - 3.4   POC LYMPH PERCENT 21.6 10 - 50 %L   MID (cbc) 0.5 0 - 0.9   POC MID % 4.3 0 - 12 %M   POC Granulocyte 8.4 (A) 2 - 6.9   Granulocyte percent 74.1 37 - 80 %G   RBC 4.69 4.69 - 6.13 M/uL   Hemoglobin 15.3 14.1 - 18.1 g/dL   HCT,  POC 42.9 (A) 43.5 - 53.7 %   MCV 91.3 80 - 97 fL   MCH, POC 32.6 (A) 27 - 31.2 pg   MCHC 35.6 (A) 31.8 - 35.4 g/dL   RDW, POC 12.9 %   Platelet Count, POC 262 142 - 424 K/uL   MPV 6.8 0 - 99.8 fL   EKG/XRAY:   Primary read interpreted by Dr. Everlene Farrier at [facility].  ASSESSMENT/PLAN:   Blood pressure is better today but he is tachycardic. He has been working out in the yard all day and I suspect dehydration is part of this. I suspect his blood pressure spike was related to his chemotherapy infusion he [facility]. I gave him a prescription for Norvasc 2.5 mg that he can take on an as needed basis if his pressure increases.I personally performed the services described in this documentation, which was scribed in my presence. The recorded information has been reviewed and is accurate.  Gross sideeffects, risk and benefits, and alternatives of medications d/w patient. Patient is aware that all medications have potential sideeffects and we are unable to predict every sideeffect or drug-drug interaction that may occur.  [name] MD 01/08/2016 2:10 PM

## 2016-01-09 ENCOUNTER — Other Ambulatory Visit: Payer: Self-pay | Admitting: Emergency Medicine

## 2016-01-09 MED ORDER — AMLODIPINE BESYLATE 2.5 MG PO TABS
2.5000 mg | ORAL_TABLET | Freq: Every day | ORAL | 3 refills | Status: DC
Start: 2016-01-09 — End: 2016-02-19

## 2016-01-17 ENCOUNTER — Ambulatory Visit (INDEPENDENT_AMBULATORY_CARE_PROVIDER_SITE_OTHER): Payer: BLUE CROSS/BLUE SHIELD | Admitting: Emergency Medicine

## 2016-01-17 VITALS — BP 120/90 | HR 69 | Temp 98.6°F | Resp 16 | Ht 73.0 in | Wt 191.0 lb

## 2016-01-17 DIAGNOSIS — I10 Essential (primary) hypertension: Secondary | ICD-10-CM

## 2016-01-17 DIAGNOSIS — C641 Malignant neoplasm of right kidney, except renal pelvis: Secondary | ICD-10-CM | POA: Diagnosis not present

## 2016-01-17 DIAGNOSIS — M25512 Pain in left shoulder: Secondary | ICD-10-CM | POA: Diagnosis not present

## 2016-01-17 LAB — BASIC METABOLIC PANEL
BUN: 18 mg/dL (ref 7–25)
CALCIUM: 9.6 mg/dL (ref 8.6–10.3)
CO2: 26 mmol/L (ref 20–31)
CREATININE: 1.09 mg/dL (ref 0.70–1.25)
Chloride: 106 mmol/L (ref 98–110)
Glucose, Bld: 72 mg/dL (ref 65–99)
Potassium: 4.4 mmol/L (ref 3.5–5.3)
Sodium: 139 mmol/L (ref 135–146)

## 2016-01-17 MED ORDER — HYDROCODONE-ACETAMINOPHEN 7.5-325 MG PO TABS: ORAL_TABLET | ORAL | 0 refills | Status: DC

## 2016-01-17 NOTE — Progress Notes (Signed)
Patient ID: Douglas [name], male   DOB: 1956-03-12, 60 y.o.   MRN: [id number]    By signing my name below, I, Essence Howell, attest that this documentation has been prepared under the direction and in the presence of [name], MD Electronically Signed: Ladene Artist, ED Scribe 01/17/2016 at 10:08 AM.  Chief Complaint:  Chief Complaint  Patient presents with  . Other    check BP  . Medication Refill    Hydrocodone 7.5-325 mg   HPI: Douglas [name] is a 60 y.o. male, with a h/o HLRCC, who reports to [facility] today for a medication refill of hydrocodone 7.5 235 mg at this visit. Pt has an upcoming appointment at [place] on 01/19/16 and plans to return on 01/21/16.   Blood Pressure  Pt's triage BP: 120/90; repeat BP: 140/88. Pt states that he is currently taking Norvasc 2.5 and Losartan 50. He states that he has been complaint with his medications and reports average evening readings of 110/75.  Past Medical History:  Diagnosis Date  . Allergic rhinitis   . Arthritis    "all over my body" (10/16/2013)  . Chronic back pain    "neck to lower back" (10/16/2013)  . Compression fracture    S/P L-SPINE; pt does not recall this hx on 10/16/2013  . COPD (chronic obstructive pulmonary disease) (HCC)    FVC .66  . Depression    "situational since 09/22/2013 OR"  . Encounter for long-term (current) use of other medications   . GERD (gastroesophageal reflux disease)   . Hemoptysis   . 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 History of blood transfusion 09/2013   "think so; not sure; related to big OR"  . History of surgical fusion joint    DDD C-SPINE  . Hyperlipidemia   . Hypertension   . Leiomyoma OF SKIN   INCREASED RISK RENAL CELL CA  NEEDS CT OF KIDNEYS EVERY 2 YEARS NEXT DUE  02/06/13  . Melanoma in situ ([place])   . Melanoma of back ([place])   . Neuromuscular disorder ([place])   . Osteoarthritis   . Renal calculi   . Renal cell carcinoma ([place])    type 2; papillary  .  Tobacco use disorder   . Unspecified vitamin D deficiency    Past Surgical History:  Procedure Laterality Date  . ANTERIOR CERVICAL DECOMP/DISCECTOMY FUSION  1992  . ANTERIOR CRUCIATE LIGAMENT REPAIR Bilateral [phone]  . APPENDECTOMY  09/22/2013  . CHOLECYSTECTOMY  09/22/2013   "had tumors in it"  . HAND LIGAMENT RECONSTRUCTION Right   . HARVEST BONE GRAFT Right 1992   "hip; for neck fusion"  . INGUINAL HERNIA REPAIR Right 2004  . LIGAMENT REPAIR Right ~ 2001   "little finger"  . LYMPH NODE DISSECTION Right 09/22/2013   "in the area of the kidney; they were clean"  . MELANOMA EXCISION  ~ 2007   "off my back"  . PARTIAL NEPHRECTOMY Right 09/22/2013  . removal of fascia overlying the right psoas Right 09/22/2013  . RENAL ARTERY STENT Right 09/22/2013  . RIGHT COLECTOMY Right 09/22/2013  . ROBOTIC ASSITED PARTIAL NEPHRECTOMY  05/22/2012   Procedure: ROBOTIC ASSITED PARTIAL NEPHRECTOMY;  Surgeon: Alexis Frock, MD;  Location: [facility];  Service: Urology;  Laterality: Right;  Right Robotic Cyst Decortication and Partial Nephrectomy   . SPINE SURGERY     Social History   Social History  . Marital status: Married    Spouse name:  N/A  . Number of children: N/A  . 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 Smoking status: Former Smoker    Packs/day: 0.50    Years: 40.00    Types: Cigarettes  . Smokeless tobacco: Never Used     Comment: Pt chewing Nicorette gum currently  . Alcohol use 0.0 oz/week     Comment: 10/16/2013 "2-3 beers daily generally; nothing last month or so"  . Drug use: No  . Sexual activity: Not Currently   Other Topics Concern  . None   Social History Narrative   Walks daily for exercise.   Family History  Problem Relation Age of Onset  . Adopted: Yes  . Hypertension Father    Allergies  Allergen Reactions  . Avelox [Moxifloxacin Hcl In Nacl] Hives, Shortness Of Breath and  Swelling  . Moxifloxacin Hives   Prior to Admission medications   Medication Sig Start Date End Date Taking? Authorizing Provider  amLODipine (NORVASC) 2.5 MG tablet Take 1 tablet (2.5 mg total) by mouth daily. 01/08/16  Yes [name], MD  amLODipine (NORVASC) 2.5 MG tablet Take 1 tablet (2.5 mg total) by mouth daily. 01/09/16  Yes [name], MD  atorvastatin (LIPITOR) 10 MG tablet TAKE 1 BY MOUTH AT BEDTIME 10/28/15  Yes [name], MD  B Complex-C (B-COMPLEX WITH VITAMIN C) tablet Take 1 tablet by mouth daily.   Yes Historical Provider, MD  Bevacizumab (AVASTIN) 100 MG/4ML SOLN Inject into the vein.   Yes Historical Provider, MD  buPROPion (WELLBUTRIN XL) 150 MG 24 hr tablet Take 150 mg by mouth daily.   Yes Historical Provider, MD  calcium carbonate 1250 MG capsule Take 1,250 mg by mouth 2 (two) times daily with a meal.   Yes Historical Provider, MD  cetirizine (ZYRTEC) 10 MG tablet Take 10 mg by mouth daily.   Yes Historical Provider, MD  cholecalciferol (VITAMIN D) 1000 UNITS tablet Take 1,000 Units by mouth daily.   Yes Historical Provider, MD  denosumab (XGEVA) 120 MG/1.7ML SOLN injection Inject 120 mg into the skin once.   Yes Historical Provider, MD  erlotinib (TARCEVA) 100 MG tablet Take 100 mg by mouth daily. Take on an empty stomach 1 hour before meals or 2 hours after   Yes Historical Provider, MD  HYDROcodone-acetaminophen (NORCO) 7.5-325 MG tablet 1 tablet every 4-6 hours for pain .prescription can be filled on 7\20\2017 12/30/15  Yes [name], MD  LORazepam (ATIVAN) 0.5 MG tablet take 1 tablet by mouth twice a day AS NEEDED FOR STRESS. 01/05/16  Yes [name], MD  losartan (COZAAR) 50 MG tablet TAKE 1 BY MOUTH DAILY BEFORE BREAKFAST 10/28/15  Yes [name], MD  Multiple Vitamin (MULTIVITAMIN WITH MINERALS) TABS tablet Take 1 tablet by mouth daily.   Yes Historical Provider, MD  ondansetron (ZOFRAN ODT) 4 MG disintegrating tablet Take 1 tablet (4 mg total) by  mouth every 8 (eight) hours as needed for nausea or vomiting. 09/09/15  Yes [name], MD  scopolamine (TRANSDERM-SCOP, 1.5 MG,) 1 MG/3DAYS Place 1 patch (1.5 mg total) onto the skin every 3 (three) days. 11/17/15  Yes [name], MD  vitamin C (ASCORBIC ACID) 500 MG tablet Take 500 mg by mouth daily.   Yes Historical Provider, MD  zoster vaccine live, PF, (ZOSTAVAX) 16109 UNT/0.65ML injection Inject 19,400 Units into the skin once. 09/22/14  Yes  [name], MD   ROS: The patient denies fevers, chills, night sweats, unintentional weight loss, chest pain, palpitations, wheezing, dyspnea on exertion, nausea, vomiting, dysuria, hematuria, melena, numbness, weakness, or tingling.   All other systems have been reviewed and were otherwise negative with the exception of those mentioned in the HPI and as above.    PHYSICAL EXAM: Vitals:   01/17/16 0928  BP: 120/90  Pulse: 69  Resp: 16  Temp: 98.6 F (37 C)   Body mass index is 25.2 kg/m.  General: Alert, no acute distress HEENT:  Normocephalic, atraumatic, oropharynx patent. Acne eruption on face Eye: E[facility], [facility] Cardiovascular: Regular rate and rhythm, no rubs murmurs or gallops. No Carotid bruits, radial pulse intact. No pedal edema.  Respiratory: Clear to auscultation bilaterally. No wheezes, rales, or rhonchi. No cyanosis, no use of accessory musculature Abdominal: Always tender in the RLQ Musculoskeletal: Gait intact. No edema. Pain with elevation of both shoulders  Skin: No rashes. Neurologic: Facial musculature symmetric. Psychiatric: Patient acts appropriately throughout our interaction. Lymphatic: No cervical or submandibular lymphadenopathy  LABS:  EKG/XRAY:   Primary read interpreted by Dr. Everlene Farrier at [facility].  ASSESSMENT/PLAN: Patient doing well. He is tolerating his chemotherapy well. Will follow-up after his next treatment. Plan is referral to Dr. Lorelei Pont. We'll also make referral to [facility]. He will need  to be on chronic pain medications with his metastatic disease. I personally performed the services described in this documentation, which was scribed in my presence. The recorded information has been reviewed and is accurate.   Gross sideeffects, risk and benefits, and alternatives of medications d/w patient. Patient is aware that all medications have potential sideeffects and we are unable to predict every sideeffect or drug-drug interaction that may occur.  [name] MD 01/17/2016 9:43 AM

## 2016-01-17 NOTE — Patient Instructions (Signed)
     IF you received an x-ray today, you will receive an invoice from [facility]. Please contact [facility] at [phone] with questions or concerns regarding your invoice.   IF you received labwork today, you will receive an invoice from [facility]. Please contact [facility] at [phone] with questions or concerns regarding your invoice.   Our billing staff will not be able to assist you with questions regarding bills from these companies.  You will be contacted with the lab results as soon as they are available. The fastest way to get your results is to activate your My Chart account. Instructions are located on the last page of this paperwork. If you have not heard from us regarding the results in 2 weeks, please contact this office.      

## 2016-02-08 DIAGNOSIS — Z0271 Encounter for disability determination: Secondary | ICD-10-CM

## 2016-02-19 ENCOUNTER — Ambulatory Visit (INDEPENDENT_AMBULATORY_CARE_PROVIDER_SITE_OTHER): Payer: BLUE CROSS/BLUE SHIELD | Admitting: Emergency Medicine

## 2016-02-19 VITALS — BP 122/86 | HR 117 | Temp 98.7°F | Resp 16 | Ht 73.0 in | Wt 190.0 lb

## 2016-02-19 DIAGNOSIS — R000 Tachycardia, unspecified: Secondary | ICD-10-CM | POA: Diagnosis not present

## 2016-02-19 DIAGNOSIS — K432 Incisional hernia without obstruction or gangrene: Secondary | ICD-10-CM | POA: Diagnosis not present

## 2016-02-19 DIAGNOSIS — D481 Neoplasm of uncertain behavior of connective and other soft tissue: Secondary | ICD-10-CM

## 2016-02-19 DIAGNOSIS — C641 Malignant neoplasm of right kidney, except renal pelvis: Secondary | ICD-10-CM | POA: Diagnosis not present

## 2016-02-19 DIAGNOSIS — M79671 Pain in right foot: Secondary | ICD-10-CM

## 2016-02-19 DIAGNOSIS — Z23 Encounter for immunization: Secondary | ICD-10-CM | POA: Diagnosis not present

## 2016-02-19 DIAGNOSIS — M79672 Pain in left foot: Secondary | ICD-10-CM

## 2016-02-19 DIAGNOSIS — M25512 Pain in left shoulder: Secondary | ICD-10-CM

## 2016-02-19 DIAGNOSIS — M25572 Pain in left ankle and joints of left foot: Secondary | ICD-10-CM

## 2016-02-19 DIAGNOSIS — I10 Essential (primary) hypertension: Secondary | ICD-10-CM

## 2016-02-19 DIAGNOSIS — M25571 Pain in right ankle and joints of right foot: Secondary | ICD-10-CM

## 2016-02-19 MED ORDER — HYDROCODONE-ACETAMINOPHEN 7.5-325 MG PO TABS: ORAL_TABLET | ORAL | 0 refills | Status: DC

## 2016-02-19 NOTE — Patient Instructions (Signed)
     IF you received an x-ray today, you will receive an invoice from [facility]. Please contact [facility] at [phone] with questions or concerns regarding your invoice.   IF you received labwork today, you will receive an invoice from [facility]. Please contact [facility] at [phone] with questions or concerns regarding your invoice.   Our billing staff will not be able to assist you with questions regarding bills from these companies.  You will be contacted with the lab results as soon as they are available. The fastest way to get your results is to activate your My Chart account. Instructions are located on the last page of this paperwork. If you have not heard from us regarding the results in 2 weeks, please contact this office.      

## 2016-02-19 NOTE — Progress Notes (Signed)
By signing my name below, I, Moises Blood, attest that this documentation has been prepared under the direction and in the presence of [name], MD. Electronically Signed: Moises Blood, [place]. 02/19/2016 , 11:21 AM .  Patient was seen in room 3 .  Chief Complaint:  Chief Complaint  Patient presents with  . Follow-up    Pain med refills     HPI: Douglas [name] is a 60 y.o. male who reports to [facility] today for follow up on his medications. He's planning to see Dr. Mardelle Matte for injections for knees and left shoulder. He's also noticed an acorn-sized hernia in his RUQ. He's followed by his surgeon who worked on his previous inguinal hernia.   He also notes [place] in [place] He wasn't able to walk, and started to Google his symptoms. He read about gout and how Vitamin B may be causing this. So he stopped taking his Vitamin B. He also has some neck pain but was previously informed his arthritis could flare up more while on his medications.   He will go back to [facility] for restaging for his renal cancer.  He is scheduled to see Dr. Lorelei Pont on Sept 20th.  He smokes 1~2 cigarettes every 7~8 days.   Past Medical History:  Diagnosis Date  . Allergic rhinitis   . Arthritis    "all over my body" (10/16/2013)  . Chronic back pain    "neck to lower back" (10/16/2013)  . Compression fracture    S/P L-SPINE; pt does not recall this hx on 10/16/2013  . COPD (chronic obstructive pulmonary disease) (HCC)    FVC .66  . Depression    "situational since 09/22/2013 OR"  . Encounter for long-term (current) use of other medications   . GERD (gastroesophageal reflux disease)   . Hemoptysis   . 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 History of blood transfusion 09/2013   "think so; not sure; related to big OR"  . History of surgical fusion joint    DDD C-SPINE  . Hyperlipidemia   . Hypertension   . Leiomyoma OF SKIN   INCREASED RISK RENAL  CELL CA  NEEDS CT OF KIDNEYS EVERY 2 YEARS NEXT DUE  02/06/13  . Melanoma in situ ([place])   . Melanoma of back ([place])   . Neuromuscular disorder ([place])   . Osteoarthritis   . Renal calculi   . Renal cell carcinoma ([place])    type 2; papillary  . Tobacco use disorder   . Unspecified vitamin D deficiency    Past Surgical History:  Procedure Laterality Date  . ANTERIOR CERVICAL DECOMP/DISCECTOMY FUSION  1992  . ANTERIOR CRUCIATE LIGAMENT REPAIR Bilateral [phone]  . APPENDECTOMY  09/22/2013  . CHOLECYSTECTOMY  09/22/2013   "had tumors in it"  . HAND LIGAMENT RECONSTRUCTION Right   . HARVEST BONE GRAFT Right 1992   "hip; for neck fusion"  . INGUINAL HERNIA REPAIR Right 2004  . LIGAMENT REPAIR Right ~ 2001   "little finger"  . LYMPH NODE DISSECTION Right 09/22/2013   "in the area of the kidney; they were clean"  . MELANOMA EXCISION  ~ 2007   "off my back"  . PARTIAL NEPHRECTOMY Right 09/22/2013  . removal of fascia overlying the right psoas Right 09/22/2013  . RENAL ARTERY STENT Right 09/22/2013  . RIGHT COLECTOMY Right 09/22/2013  . ROBOTIC ASSITED PARTIAL NEPHRECTOMY  05/22/2012   Procedure:  ROBOTIC ASSITED PARTIAL NEPHRECTOMY;  Surgeon: Alexis Frock, MD;  Location: [facility];  Service: Urology;  Laterality: Right;  Right Robotic Cyst Decortication and Partial Nephrectomy   . SPINE SURGERY     Social History   Social History  . Marital status: Married    Spouse name: N/A  . Number of children: N/A  . 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 Smoking status: Former Smoker    Packs/day: 0.50    Years: 40.00    Types: Cigarettes  . Smokeless tobacco: Never Used     Comment: Pt chewing Nicorette gum currently  . Alcohol use 0.0 oz/week     Comment: 10/16/2013 "2-3 beers daily generally; nothing last month or so"  . Drug use: No  . Sexual activity: Not Currently   Other Topics Concern  . None    Social History Narrative   Walks daily for exercise.   Family History  Problem Relation Age of Onset  . Adopted: Yes  . Hypertension Father    Allergies  Allergen Reactions  . Avelox [Moxifloxacin Hcl In Nacl] Hives, Shortness Of Breath and Swelling  . Moxifloxacin Hives   Prior to Admission medications   Medication Sig Start Date End Date Taking? Authorizing Provider  amLODipine (NORVASC) 5 MG tablet Take 5 mg by mouth daily.   Yes Historical Provider, MD  atorvastatin (LIPITOR) 10 MG tablet TAKE 1 BY MOUTH AT BEDTIME 10/28/15  Yes [name], MD  Bevacizumab (AVASTIN) 100 MG/4ML SOLN Inject into the vein.   Yes Historical Provider, MD  buPROPion (WELLBUTRIN XL) 150 MG 24 hr tablet Take 150 mg by mouth daily.   Yes Historical Provider, MD  calcium carbonate 1250 MG capsule Take 1,250 mg by mouth 2 (two) times daily with a meal.   Yes Historical Provider, MD  cetirizine (ZYRTEC) 10 MG tablet Take 10 mg by mouth daily.   Yes Historical Provider, MD  cholecalciferol (VITAMIN D) 1000 UNITS tablet Take 1,000 Units by mouth daily.   Yes Historical Provider, MD  denosumab (XGEVA) 120 MG/1.7ML SOLN injection Inject 120 mg into the skin once.   Yes Historical Provider, MD  erlotinib (TARCEVA) 100 MG tablet Take 100 mg by mouth daily. Take on an empty stomach 1 hour before meals or 2 hours after   Yes Historical Provider, MD  HYDROcodone-acetaminophen (NORCO) 7.5-325 MG tablet 1 tablet every 4-6 hours for pain .prescription can be filled on 01/21/2016 01/17/16  Yes [name], MD  LORazepam (ATIVAN) 0.5 MG tablet take 1 tablet by mouth twice a day AS NEEDED FOR STRESS. 01/05/16  Yes [name], MD  losartan (COZAAR) 50 MG tablet TAKE 1 BY MOUTH DAILY BEFORE BREAKFAST 10/28/15  Yes [name], MD  Multiple Vitamin (MULTIVITAMIN WITH MINERALS) TABS tablet Take 1 tablet by mouth daily.   Yes Historical Provider, MD  ondansetron (ZOFRAN ODT) 4 MG disintegrating tablet Take 1 tablet (4 mg  total) by mouth every 8 (eight) hours as needed for nausea or vomiting. 09/09/15  Yes [name], MD  vitamin C (ASCORBIC ACID) 500 MG tablet Take 500 mg by mouth daily.   Yes Historical Provider, MD  B Complex-C (B-COMPLEX WITH VITAMIN C) tablet Take 1 tablet by mouth daily.    Historical Provider, MD  HYDROcodone-acetaminophen (NORCO) 7.5-325 MG tablet 1 tablet every 4-6 hours for pain .prescription can be filled on on 02/06/2016  Patient not taking: Reported on 02/19/2016 01/17/16   [name], MD  scopolamine (TRANSDERM-SCOP, 1.5 MG,) 1 MG/3DAYS Place 1 patch (1.5 mg total) onto the skin every 3 (three) days. Patient not taking: Reported on 02/19/2016 11/17/15   [name], MD  zoster vaccine live, PF, (ZOSTAVAX) 60454 UNT/0.65ML injection Inject 19,400 Units into the skin once. Patient not taking: Reported on 02/19/2016 09/22/14   [name], MD     ROS:  Constitutional: negative for fever, chills, night sweats, weight changes, or fatigue  HEENT: negative for vision changes, hearing loss, congestion, rhinorrhea, ST, epistaxis, or sinus pressure Cardiovascular: negative for chest pain or palpitations Respiratory: negative for hemoptysis, wheezing, shortness of breath, or cough Abdominal: negative for abdominal pain, nausea, vomiting, diarrhea, or constipation Dermatological: negative for rash Musc: positive for arthralgia, myalgia, neck pain Neurologic: negative for headache, dizziness, or syncope All other systems reviewed and are otherwise negative with the exception to those above and in the HPI.  PHYSICAL EXAM: Vitals:   02/19/16 1017  BP: 122/86  Pulse: (!) 117  Resp: 16  Temp: 98.7 F (37.1 C)   Body mass index is 25.07 kg/m.   General: Alert, no acute distress HEENT:  Normocephalic, atraumatic, oropharynx patent; Acne eruption on his face Eye: EOMI, [facility] Cardiovascular:  Regular rate and rhythm, no rubs murmurs or gallops.  No Carotid bruits, radial pulse intact.  No pedal edema.  Respiratory: Clear to auscultation bilaterally.  No wheezes, rales, or rhonchi.  No cyanosis, no use of accessory musculature Abdominal: healed right upper abdominal scar with 2cm defect consistent with hernia, mild RLQ tenderness Musculoskeletal: Gait intact. No edema, tenderness Skin: No rashes. Neurologic: Facial musculature symmetric. Psychiatric: Patient acts appropriately throughout our interaction.  Lymphatic: No cervical or submandibular lymphadenopathy Genitourinary/Anorectal: No acute findings  Foot exam shows normal pulses no swelling. LABS:   EKG/XRAY:     ASSESSMENT/PLAN: Overall he is doing well. Takes 6 hydrocodone a day for his metastatic bone disease he has a follow-up at [place] in about 10 days for restaging. I told him I would refill his medications today and one more time in September. He is going to discuss with Dr. Edilia Bo in about 3 weeks whether she will take over his care.I personally performed the services described in this documentation, which was scribed in my presence. The recorded information has been reviewed and is accurate.  Gross sideeffects, risk and benefits, and alternatives of medications d/w patient. Patient is aware that all medications have potential sideeffects and we are unable to predict every sideeffect or drug-drug interaction that may occur.  [name] MD 02/19/2016 11:02 AM

## 2016-03-06 ENCOUNTER — Ambulatory Visit (INDEPENDENT_AMBULATORY_CARE_PROVIDER_SITE_OTHER): Payer: BLUE CROSS/BLUE SHIELD | Admitting: Emergency Medicine

## 2016-03-06 VITALS — BP 114/78 | HR 87 | Temp 98.5°F | Resp 17 | Ht 73.0 in | Wt 191.0 lb

## 2016-03-06 DIAGNOSIS — M25571 Pain in right ankle and joints of right foot: Secondary | ICD-10-CM

## 2016-03-06 DIAGNOSIS — C641 Malignant neoplasm of right kidney, except renal pelvis: Secondary | ICD-10-CM

## 2016-03-06 DIAGNOSIS — M25572 Pain in left ankle and joints of left foot: Secondary | ICD-10-CM

## 2016-03-06 DIAGNOSIS — M79671 Pain in right foot: Secondary | ICD-10-CM | POA: Diagnosis not present

## 2016-03-06 DIAGNOSIS — M79672 Pain in left foot: Secondary | ICD-10-CM

## 2016-03-06 DIAGNOSIS — C801 Malignant (primary) neoplasm, unspecified: Secondary | ICD-10-CM

## 2016-03-06 DIAGNOSIS — C799 Secondary malignant neoplasm of unspecified site: Secondary | ICD-10-CM

## 2016-03-06 DIAGNOSIS — M25512 Pain in left shoulder: Secondary | ICD-10-CM | POA: Diagnosis not present

## 2016-03-06 DIAGNOSIS — D481 Neoplasm of uncertain behavior of connective and other soft tissue: Secondary | ICD-10-CM

## 2016-03-06 MED ORDER — HYDROCODONE-ACETAMINOPHEN 7.5-325 MG PO TABS: ORAL_TABLET | ORAL | 0 refills | Status: DC

## 2016-03-06 MED ORDER — LORAZEPAM 0.5 MG PO TABS: ORAL_TABLET | ORAL | 5 refills | Status: DC

## 2016-03-06 NOTE — Progress Notes (Signed)
Patient ID: Douglas [name], male   DOB: 30-May-1956, 60 y.o.   MRN: [id number]    By signing my name below, I, Essence Howell, attest that this documentation has been prepared under the direction and in the presence of [name], MD Electronically Signed: Ladene Artist, ED Scribe 03/06/2016 at 2:55 PM.  Chief Complaint:  Chief Complaint  Patient presents with  . Medication Refill    hydrocodone and ativan    HPI: Douglas [name] is a 60 y.o. male, with a h/o HLRCC, who reports to [facility] today for a medication refill of hydrocodone 7.5 325 mg and ativan. Pt reports that the tumor on his kidney has shrunken a little and the tumor on his liver is stable. Pt has tried insoles in his shoes made for plantar fascitis with mild relief. He also had an injection in his right knee by one of Dr. Luanna Cole PA's after his last office visit on 02/19/16 and states that he did not get much relief this time. He has an upcoming appointment with Lamar Blinks, MD in 2 days to establish care.    Past Medical History:  Diagnosis Date  . Allergic rhinitis   . Arthritis    "all over my body" (10/16/2013)  . Chronic back pain    "neck to lower back" (10/16/2013)  . Compression fracture    S/P L-SPINE; pt does not recall this hx on 10/16/2013  . COPD (chronic obstructive pulmonary disease) (HCC)    FVC .66  . Depression    "situational since 09/22/2013 OR"  . Encounter for long-term (current) use of other medications   . GERD (gastroesophageal reflux disease)   . Hemoptysis   . 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 History of blood transfusion 09/2013   "think so; not sure; related to big OR"  . History of surgical fusion joint    DDD C-SPINE  . Hyperlipidemia   . Hypertension   . Leiomyoma OF SKIN   INCREASED RISK RENAL CELL CA  NEEDS CT OF KIDNEYS EVERY 2 YEARS NEXT DUE  02/06/13  . Melanoma in situ ([place])   . Melanoma of back ([place])   . Neuromuscular disorder ([place])   .  Osteoarthritis   . Renal calculi   . Renal cell carcinoma ([place])    type 2; papillary  . Tobacco use disorder   . Unspecified vitamin D deficiency    Past Surgical History:  Procedure Laterality Date  . ANTERIOR CERVICAL DECOMP/DISCECTOMY FUSION  1992  . ANTERIOR CRUCIATE LIGAMENT REPAIR Bilateral [phone]  . APPENDECTOMY  09/22/2013  . CHOLECYSTECTOMY  09/22/2013   "had tumors in it"  . HAND LIGAMENT RECONSTRUCTION Right   . HARVEST BONE GRAFT Right 1992   "hip; for neck fusion"  . INGUINAL HERNIA REPAIR Right 2004  . LIGAMENT REPAIR Right ~ 2001   "little finger"  . LYMPH NODE DISSECTION Right 09/22/2013   "in the area of the kidney; they were clean"  . MELANOMA EXCISION  ~ 2007   "off my back"  . PARTIAL NEPHRECTOMY Right 09/22/2013  . removal of fascia overlying the right psoas Right 09/22/2013  . RENAL ARTERY STENT Right 09/22/2013  . RIGHT COLECTOMY Right 09/22/2013  . ROBOTIC ASSITED PARTIAL NEPHRECTOMY  05/22/2012   Procedure: ROBOTIC ASSITED PARTIAL NEPHRECTOMY;  Surgeon: Alexis Frock, MD;  Location: [facility];  Service: Urology;  Laterality: Right;  Right Robotic Cyst Decortication and Partial Nephrectomy   . SPINE SURGERY  Social History   Social History  . Marital status: Married    Spouse name: N/A  . Number of children: N/A  . 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 Smoking status: Former Smoker    Packs/day: 0.50    Years: 40.00    Types: Cigarettes  . Smokeless tobacco: Never Used     Comment: Pt chewing Nicorette gum currently  . Alcohol use 0.0 oz/week     Comment: 10/16/2013 "2-3 beers daily generally; nothing last month or so"  . Drug use: No  . Sexual activity: Not Currently   Other Topics Concern  . None   Social History Narrative   Walks daily for exercise.   Family History  Problem Relation Age of Onset  . Adopted: Yes  . Hypertension Father    Allergies    Allergen Reactions  . Avelox [Moxifloxacin Hcl In Nacl] Hives, Shortness Of Breath and Swelling  . Moxifloxacin Hives   Prior to Admission medications   Medication Sig Start Date End Date Taking? Authorizing Provider  amLODipine (NORVASC) 5 MG tablet Take 5 mg by mouth daily.   Yes Historical Provider, MD  atorvastatin (LIPITOR) 10 MG tablet TAKE 1 BY MOUTH AT BEDTIME 10/28/15  Yes [name], MD  B Complex-C (B-COMPLEX WITH VITAMIN C) tablet Take 1 tablet by mouth daily.   Yes Historical Provider, MD  Bevacizumab (AVASTIN) 100 MG/4ML SOLN Inject into the vein.   Yes Historical Provider, MD  buPROPion (WELLBUTRIN XL) 150 MG 24 hr tablet Take 150 mg by mouth daily.   Yes Historical Provider, MD  calcium carbonate 1250 MG capsule Take 1,250 mg by mouth 2 (two) times daily with a meal.   Yes Historical Provider, MD  cetirizine (ZYRTEC) 10 MG tablet Take 10 mg by mouth daily.   Yes Historical Provider, MD  cholecalciferol (VITAMIN D) 1000 UNITS tablet Take 1,000 Units by mouth daily.   Yes Historical Provider, MD  denosumab (XGEVA) 120 MG/1.7ML SOLN injection Inject 120 mg into the skin once.    Yes Historical Provider, MD  erlotinib (TARCEVA) 100 MG tablet Take 100 mg by mouth daily. Take on an empty stomach 1 hour before meals or 2 hours after   Yes Historical Provider, MD  HYDROcodone-acetaminophen (NORCO) 7.5-325 MG tablet 1 tablet every 4-6 hours for pain .prescription can be filled on on 02/06/2016 01/17/16  Yes [name], MD  HYDROcodone-acetaminophen ([place]) 7.5-325 MG tablet 1 tablet every 4-6 hours for pain .prescription can be filled on 01/21/2016 02/19/16  Yes [name], MD  LORazepam (ATIVAN) 0.5 MG tablet take 1 tablet by mouth twice a day AS NEEDED FOR STRESS. 01/05/16  Yes [name], MD  losartan (COZAAR) 50 MG tablet TAKE 1 BY MOUTH DAILY BEFORE BREAKFAST 10/28/15  Yes [name], MD  Multiple Vitamin (MULTIVITAMIN WITH MINERALS) TABS tablet Take 1 tablet by mouth daily.    Yes Historical Provider, MD  ondansetron (ZOFRAN ODT) 4 MG disintegrating tablet Take 1 tablet (4 mg total) by mouth every 8 (eight) hours as needed for nausea or vomiting. 09/09/15  Yes [name], MD  scopolamine (TRANSDERM-SCOP, 1.5 MG,) 1 MG/3DAYS Place 1 patch (1.5 mg total) onto the skin every 3 (three) days. 11/17/15  Yes [name], MD  vitamin C (ASCORBIC ACID) 500 MG tablet Take 500 mg by mouth daily.   Yes Historical Provider, MD  zoster vaccine live, PF, (ZOSTAVAX) 60454 UNT/0.65ML injection Inject 19,400 Units into the skin once. 09/22/14  Yes [name], MD   ROS: The patient denies fevers, chills, night sweats, unintentional weight loss, chest pain, palpitations, wheezing, dyspnea on exertion, nausea, vomiting, abdominal pain, dysuria, hematuria, melena, numbness, weakness, or tingling.   All other systems have been reviewed and were otherwise negative with the exception of those mentioned in the HPI and as above.    PHYSICAL EXAM: Vitals:   03/06/16 1453  Pulse: 87  Resp: 17  Temp: 98.5 F (36.9 C)   Body mass index is 25.2 kg/m.   General: Alert, no acute distress HEENT:  Normocephalic, atraumatic, oropharynx patent. Acne eruption on face Eye: EOMI, [facility] Cardiovascular:  Regular rate and rhythm, no rubs murmurs or gallops.  No Carotid bruits, radial pulse intact. No pedal edema.  Respiratory: Clear to auscultation bilaterally.  No wheezes, rales, or rhonchi.  No cyanosis, no use of accessory musculature Abdominal: No organomegaly, abdomen is soft, positive bowel sounds. Scar midline and on the R side of abdomen with sensitivity in the RLQ Musculoskeletal: Gait intact. No edema, tenderness Skin: No rashes. Neurologic: Facial musculature symmetric. Psychiatric: Patient acts appropriately throughout our interaction. Lymphatic: No cervical or submandibular lymphadenopathy  LABS:  EKG/XRAY:   Primary read interpreted by Dr. Everlene Farrier at  [facility].   ASSESSMENT/PLAN: Lasted scans showed no progression of the lesions. He will continue on his same chemotherapy program at [place]. He does have his oncologist cc as part of [facility] in [place]. He is going to follow-up with Dr. Lorelei Pont. He was given prescription for Ativan for 6 months he takes only at night and will not take with any pain medication. He was given prescriptions for pain medications to fill on September 19 as well as October 4. He overall is doing well. He continues to go to group meetings. He was very elated about his recent scans and optimistic about the future.I personally performed the services described in this documentation, which was scribed in my presence. The recorded information has been reviewed and is accurate.   Gross sideeffects, risk and benefits, and alternatives of medications d/w patient. Patient is aware that all medications have potential sideeffects and we are unable to predict every sideeffect or drug-drug interaction that may occur.  [name] MD 03/06/2016 2:55 PM

## 2016-03-06 NOTE — Patient Instructions (Signed)
     IF you received an x-ray today, you will receive an invoice from [facility]. Please contact [facility] at [phone] with questions or concerns regarding your invoice.   IF you received labwork today, you will receive an invoice from [facility]. Please contact [facility] at [phone] with questions or concerns regarding your invoice.   Our billing staff will not be able to assist you with questions regarding bills from these companies.  You will be contacted with the lab results as soon as they are available. The fastest way to get your results is to activate your My Chart account. Instructions are located on the last page of this paperwork. If you have not heard from us regarding the results in 2 weeks, please contact this office.      

## 2016-03-08 ENCOUNTER — Encounter: Payer: Self-pay | Admitting: Family Medicine

## 2016-03-08 ENCOUNTER — Ambulatory Visit (INDEPENDENT_AMBULATORY_CARE_PROVIDER_SITE_OTHER): Payer: BLUE CROSS/BLUE SHIELD | Admitting: Family Medicine

## 2016-03-08 VITALS — BP 126/82 | HR 83 | Temp 98.3°F | Ht 72.25 in | Wt 188.6 lb

## 2016-03-08 DIAGNOSIS — C641 Malignant neoplasm of right kidney, except renal pelvis: Secondary | ICD-10-CM

## 2016-03-08 DIAGNOSIS — C801 Malignant (primary) neoplasm, unspecified: Secondary | ICD-10-CM

## 2016-03-08 DIAGNOSIS — R110 Nausea: Secondary | ICD-10-CM

## 2016-03-08 DIAGNOSIS — M2550 Pain in unspecified joint: Secondary | ICD-10-CM

## 2016-03-08 DIAGNOSIS — C799 Secondary malignant neoplasm of unspecified site: Secondary | ICD-10-CM

## 2016-03-08 DIAGNOSIS — M25512 Pain in left shoulder: Secondary | ICD-10-CM | POA: Diagnosis not present

## 2016-03-08 NOTE — Progress Notes (Signed)
[place] at [facility] [address], [place], [place] [address] [phone]  Date:  03/08/2016   Name:  Douglas [name]   DOB:  1955/07/28   MRN:  [id number]  PCP:  Lamar Blinks, MD    Chief Complaint: Establish Care (Pt here to est care. )   History of Present Illness:  Douglas [name] is a 60 y.o. very pleasant male patient who presents with the following:  Here today to establish care as PCP- Dr. Everlene Farrier- is retiring He has a advanced renal cell cancer managed by [facility].  He has stage 4 cancer that is not curable. He is doing a drug trial at the [place] in [place] which requires them to make a round trip there every 2 weeks.  He started the trial in February of this year and is on 3 drugs, Avastin, Xgeva and Tarceva.   He notes that his "largest tumor that I had, it shrunk relatively quick, but a new one showed up in my liver."  This is thought to be a met and not a new primary.  Last week he got "great news, it had stabilized and all the others are stable or smaller."  His ascending colon was removed and he developed a hernia in his abd wall from the incision site He sees a local oncologist as well- at the [facility] cancer center in [place].    He does have some fatigue that can limit his activities. He is collecting disability from his job- he had planned to retire next year but is happy that he was able to get disability.  He had hoped to be fishing and golfing but he is not able to do as much due to low energy.   "I try to be positive, but I do have some up and down days."   His wife is very supportive, "we are a team."  He has some nausea, some diarrhea that "comes and goes." He does have a lot of pain- some of this may be from the drugs that he is taking.  He hurts in his neck (history of neck fusions), knees, and other joints.    He is on norco 7.5 for his pain.    Reviewed NCCSR: he got 105 hydrocodone on 9/19- this is a 15-17 day  supply.  He was also given an rx to fill on 03/22/16. No unexpected entries. He also gets lorazepam.  He uses the hydrocodone approx 6 per day.  He may take a little less or a little more.  He wonders if I am willing to continue to rx this for him- I am, we had him do a contract today   Patient Active Problem List   Diagnosis Date Noted  . CAD (coronary artery disease) 03/25/2014  . Protein-calorie malnutrition, severe ([place]) 10/17/2013  . Complicated UTI (urinary tract infection) 10/16/2013  . Pyelonephritis 10/16/2013  . 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 Renal calculi   . GERD (gastroesophageal reflux disease)   . Allergic rhinitis   . Osteoarthritis   . Encounter for long-term (current) use of other medications   . Hyperlipidemia   . Tobacco use disorder   . COPD (chronic obstructive pulmonary disease) ([place])   . Unspecified vitamin D deficiency   . Hypertension     Past Medical History:  Diagnosis Date  . Allergic rhinitis   . Arthritis    "all over my body" (10/16/2013)  .  Chronic back pain    "neck to lower back" (10/16/2013)  . Compression fracture    S/P L-SPINE; pt does not recall this hx on 10/16/2013  . COPD (chronic obstructive pulmonary disease) (HCC)    FVC .66  . Depression    "situational since 09/22/2013 OR"  . Encounter for long-term (current) use of other medications   . GERD (gastroesophageal reflux disease)   . Hemoptysis   . 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 History of blood transfusion 09/2013   "think so; not sure; related to big OR"  . History of surgical fusion joint    DDD C-SPINE  . Hyperlipidemia   . Hypertension   . Leiomyoma OF SKIN   INCREASED RISK RENAL CELL CA  NEEDS CT OF KIDNEYS EVERY 2 YEARS NEXT DUE  02/06/13  . Melanoma in situ ([place])   . Melanoma of back ([place])   . Neuromuscular disorder ([place])   . Osteoarthritis   . Renal calculi   . Renal cell carcinoma ([place])     type 2; papillary  . Tobacco use disorder   . Unspecified vitamin D deficiency     Past Surgical History:  Procedure Laterality Date  . ANTERIOR CERVICAL DECOMP/DISCECTOMY FUSION  1992  . ANTERIOR CRUCIATE LIGAMENT REPAIR Bilateral [phone]  . APPENDECTOMY  09/22/2013  . CHOLECYSTECTOMY  09/22/2013   "had tumors in it"  . HAND LIGAMENT RECONSTRUCTION Right   . HARVEST BONE GRAFT Right 1992   "hip; for neck fusion"  . INGUINAL HERNIA REPAIR Right 2004  . LIGAMENT REPAIR Right ~ 2001   "little finger"  . LYMPH NODE DISSECTION Right 09/22/2013   "in the area of the kidney; they were clean"  . MELANOMA EXCISION  ~ 2007   "off my back"  . PARTIAL NEPHRECTOMY Right 09/22/2013  . removal of fascia overlying the right psoas Right 09/22/2013  . RENAL ARTERY STENT Right 09/22/2013  . RIGHT COLECTOMY Right 09/22/2013  . ROBOTIC ASSITED PARTIAL NEPHRECTOMY  05/22/2012   Procedure: ROBOTIC ASSITED PARTIAL NEPHRECTOMY;  Surgeon: Alexis Frock, MD;  Location: [facility];  Service: Urology;  Laterality: Right;  Right Robotic Cyst Decortication and Partial Nephrectomy   . SPINE SURGERY      Social History  Substance Use Topics  . Smoking status: Former Smoker    Packs/day: 0.50    Years: 40.00    Types: Cigarettes  . Smokeless tobacco: Never Used     Comment: Pt chewing Nicorette gum currently  . Alcohol use 0.0 oz/week     Comment: 10/16/2013 "2-3 beers daily generally; nothing last month or so"    Family History  Problem Relation Age of Onset  . Adopted: Yes  . Hypertension Father     Allergies  Allergen Reactions  . Avelox [Moxifloxacin Hcl In Nacl] Hives, Shortness Of Breath and Swelling  . Moxifloxacin Hives    Medication list has been reviewed and updated.  Current Outpatient Prescriptions on File Prior to Visit  Medication Sig Dispense Refill  . amLODipine (NORVASC) 5 MG tablet Take 5 mg by mouth daily.    Marland Kitchen atorvastatin (LIPITOR) 10 MG tablet TAKE 1 BY MOUTH AT  BEDTIME 90 tablet 1  . B Complex-C (B-COMPLEX WITH VITAMIN C) tablet Take 1 tablet by mouth daily.    . Bevacizumab (AVASTIN) 100 MG/4ML SOLN Inject into the vein.    Marland Kitchen buPROPion (WELLBUTRIN XL) 150 MG 24 hr tablet Take 150 mg by mouth  daily.    . calcium carbonate 1250 MG capsule Take 1,250 mg by mouth 2 (two) times daily with a meal.    . cetirizine (ZYRTEC) 10 MG tablet Take 10 mg by mouth daily.    . cholecalciferol (VITAMIN D) 1000 UNITS tablet Take 1,000 Units by mouth daily.    Marland Kitchen denosumab (XGEVA) 120 MG/1.7ML SOLN injection Inject 120 mg into the skin once.     . erlotinib (TARCEVA) 100 MG tablet Take 100 mg by mouth daily. Take on an empty stomach 1 hour before meals or 2 hours after    . HYDROcodone-acetaminophen (NORCO) 7.5-325 MG tablet 1 tablet every 4-6 hours for pain .prescription can be filled on on 03/22/2016 105 tablet 0  . HYDROcodone-acetaminophen (NORCO) 7.5-325 MG tablet 1 tablet every 4-6 hours for pain .prescription can be filled on 03/07/2016 105 tablet 0  . LORazepam (ATIVAN) 0.5 MG tablet Take 1 tablet at night for sleep. Not to be taken with pain medication. 30 tablet 5  . losartan (COZAAR) 50 MG tablet TAKE 1 BY MOUTH DAILY BEFORE BREAKFAST 90 tablet 1  . Multiple Vitamin (MULTIVITAMIN WITH MINERALS) TABS tablet Take 1 tablet by mouth daily.    . ondansetron (ZOFRAN ODT) 4 MG disintegrating tablet Take 1 tablet (4 mg total) by mouth every 8 (eight) hours as needed for nausea or vomiting. 20 tablet 5  . scopolamine (TRANSDERM-SCOP, 1.5 MG,) 1 MG/3DAYS Place 1 patch (1.5 mg total) onto the skin every 3 (three) days. 10 patch 0  . vitamin C (ASCORBIC ACID) 500 MG tablet Take 500 mg by mouth daily.    Marland Kitchen zoster vaccine live, PF, (ZOSTAVAX) 03833 UNT/0.65ML injection Inject 19,400 Units into the skin once. 1 each 0   No current facility-administered medications on file prior to visit.     Review of Systems:  As per HPI- otherwise negative.   Physical  Examination: Vitals:   03/08/16 0952  BP: 126/82  Pulse: 83  Temp: 98.3 F (36.8 C)   Vitals:   03/08/16 0952  Weight: 188 lb 9.6 oz (85.5 kg)  Height: 6' 0.25" (1.835 m)   Body mass index is 25.4 kg/m. Ideal Body Weight: Weight in (lb) to have BMI = 25: 185.2  GEN: WDWN, NAD, Non-toxic, A & O x 3 HEENT: Atraumatic, Normocephalic. Neck supple. No masses, No LAD. Ears and Nose: No external deformity. CV: RRR, No M/G/R. No JVD. No thrill. No extra heart sounds. PULM: CTA B, no wheezes, crackles, rhonchi. No retractions. No resp. distress. No accessory muscle use. ABD: S, NT, ND, +BS. No rebound. No HSM. EXTR: No c/c/e NEURO Normal gait.  PSYCH: Normally interactive. Conversant. Not depressed or anxious appearing.  Calm demeanor.    Assessment and Plan: Renal cell cancer, right (HCC)  Metastatic disease ([place])  Pain in joint, shoulder region, left  Nausea without vomiting  Pain in joint, multiple sites  Here today to re-establish care with me His main issue is stage 4 renal cell cancer with chronic pain He does not need pain medication right now but will let me know when he is due.  Right now he is getting a 15 day supply per rx- I plan to increase this to a 30 day supply for conveninece  Signed Lamar Blinks, MD

## 2016-03-08 NOTE — Patient Instructions (Signed)
It was very nice to see you today- please let me know when you need refills of your medications.  Let's plan to meet again in about 3 months

## 2016-03-10 NOTE — Addendum Note (Signed)
Addended by: Emi Holes on: 03/10/2016 02:07 PM   Modules accepted: Orders

## 2016-03-26 ENCOUNTER — Encounter: Payer: Self-pay | Admitting: Family Medicine

## 2016-03-27 MED ORDER — HYDROCODONE-ACETAMINOPHEN 7.5-325 MG PO TABS: ORAL_TABLET | ORAL | 0 refills | Status: DC

## 2016-04-07 ENCOUNTER — Telehealth: Payer: Self-pay | Admitting: Emergency Medicine

## 2016-04-07 MED ORDER — ATORVASTATIN CALCIUM 10 MG PO TABS: ORAL_TABLET | ORAL | 1 refills | Status: DC

## 2016-04-07 MED ORDER — LOSARTAN POTASSIUM 50 MG PO TABS: ORAL_TABLET | ORAL | 1 refills | Status: DC

## 2016-04-07 NOTE — Telephone Encounter (Signed)
Received refill request for [employer]. Refill sent for Losartan 50 MG tablets and Atorvastatin 10 MG tablets.

## 2016-04-14 ENCOUNTER — Telehealth: Payer: Self-pay | Admitting: Family Medicine

## 2016-04-14 NOTE — Telephone Encounter (Signed)
Please advise atorvastatin. I do not see lipid panel on file for pt. Does have AST/ALT in Care Everywhere from 03/20/16 that is WNL.   LORAZEPAM Last OV: 03/08/16 Last filled: 03/06/16, #30, 5 RF   Sig: Take 1 tablet at night for sleep. Not to be taken with pain medication. UDS: Not on file

## 2016-04-14 NOTE — Telephone Encounter (Signed)
Caller name: Relationship to patient: Self Can be reached: [phone]  Pharmacy:  [facility] filled by [facility], [address] [phone] (Phone) [phone] (Fax)     Reason for call: Request refill on LORazepam (ATIVAN) 0.5 MG tablet [id number]  States mail-order says they did not receive Rx for atorvastatin (LIPITOR) 10 MG tablet [id number]

## 2016-04-15 ENCOUNTER — Encounter: Payer: Self-pay | Admitting: Family Medicine

## 2016-04-15 MED ORDER — ATORVASTATIN CALCIUM 10 MG PO TABS: ORAL_TABLET | ORAL | 3 refills | Status: DC

## 2016-04-17 ENCOUNTER — Encounter: Payer: Self-pay | Admitting: Family Medicine

## 2016-05-26 ENCOUNTER — Ambulatory Visit: Payer: BLUE CROSS/BLUE SHIELD | Admitting: Podiatry

## 2016-05-29 ENCOUNTER — Encounter: Payer: Self-pay | Admitting: Family Medicine

## 2016-05-29 ENCOUNTER — Ambulatory Visit (INDEPENDENT_AMBULATORY_CARE_PROVIDER_SITE_OTHER): Payer: BLUE CROSS/BLUE SHIELD | Admitting: Family Medicine

## 2016-05-29 VITALS — BP 120/90 | HR 84 | Temp 98.2°F | Ht 73.0 in | Wt 190.2 lb

## 2016-05-29 DIAGNOSIS — C641 Malignant neoplasm of right kidney, except renal pelvis: Secondary | ICD-10-CM

## 2016-05-29 DIAGNOSIS — R1011 Right upper quadrant pain: Secondary | ICD-10-CM | POA: Diagnosis not present

## 2016-05-29 DIAGNOSIS — E785 Hyperlipidemia, unspecified: Secondary | ICD-10-CM

## 2016-05-29 DIAGNOSIS — C799 Secondary malignant neoplasm of unspecified site: Secondary | ICD-10-CM

## 2016-05-29 DIAGNOSIS — Z131 Encounter for screening for diabetes mellitus: Secondary | ICD-10-CM

## 2016-05-29 DIAGNOSIS — Z125 Encounter for screening for malignant neoplasm of prostate: Secondary | ICD-10-CM

## 2016-05-29 LAB — LDL CHOLESTEROL, DIRECT: Direct LDL: 65 mg/dL

## 2016-05-29 LAB — LIPID PANEL
CHOL/HDL RATIO: 3
Cholesterol: 195 mg/dL (ref 0–200)
HDL: 68.9 mg/dL (ref >39.00)
NONHDL: 126.02
TRIGLYCERIDES: 356 mg/dL — AB (ref 0.0–149.0)
VLDL: 71.2 mg/dL — ABNORMAL HIGH (ref 0.0–40.0)

## 2016-05-29 LAB — HEMOGLOBIN A1C: Hgb A1c MFr Bld: 5.2 % (ref 4.6–6.5)

## 2016-05-29 LAB — PSA: PSA: 2.31 ng/mL (ref 0.10–4.00)

## 2016-05-29 MED ORDER — HYDROCODONE-ACETAMINOPHEN 7.5-325 MG PO TABS: ORAL_TABLET | ORAL | 0 refills | Status: DC

## 2016-05-29 NOTE — Patient Instructions (Addendum)
It was good to see you today- I am glad that you are doing well overall!   We will refill your pain medication for the next 2 months- 4 rx total Let me know if you need anything and let's check back in 3-4 months.    We will check your cholesterol and other routine labs today Please give a urine sample of a UDS today per office policy Take care!

## 2016-05-29 NOTE — Progress Notes (Signed)
Pre visit review using our clinic review tool, if applicable. No additional management support is needed unless otherwise documented below in the visit note. 

## 2016-05-29 NOTE — Progress Notes (Signed)
[place] at [facility] [address], [place], [place] [address] [phone]  Date:  05/29/2016   Name:  Douglas [name]   DOB:  [birth year]   MRN:  [id number]  PCP:  Lamar Blinks, MD    Chief Complaint: Follow-up (Pt here for med refills and to follow up to [facility] week before. )   History of Present Illness:  Douglas [name] is a 60 y.o. very pleasant male patient who presents with the following:  Last visit here on 9/20- HPI from that visit:  He has a advanced renal cell cancer managed by [facility].  He has stage 4 cancer that is not curable. He is doing a drug trial at the [facility] in [place] which requires them to make a round trip there every 2 weeks.  He started the trial in February of this year and is on 3 drugs, Avastin, Xgeva and Tarceva.   He notes that his "largest tumor that I had, it shrunk relatively quick, but a new one showed up in my liver."  This is thought to be a met and not a new primary.  Last week he got "great news, it had stabilized and all the others are stable or smaller."  His ascending colon was removed and he developed a hernia in his abd wall from the incision site He sees a local oncologist as well- at the [facility] cancer center in [place].    He does have some fatigue that can limit his activities. He is collecting disability from his job- he had planned to retire next year but is happy that he was able to get disability.  He had hoped to be fishing and golfing but he is not able to do as much due to low energy.   "I try to be positive, but I do have some up and down days."   His wife is very supportive, "we are a team."  He has some nausea, some diarrhea that "comes and goes." He does have a lot of pain- some of this may be from the drugs that he is taking.  He hurts in his neck (history of neck fusions), knees, and other joints.    He is on norco 7.5 for his pain.    Reviewed NCCSR: he got 105  hydrocodone on 9/19- this is a 15-17 day supply.  He was also given an rx to fill on 03/22/16. No unexpected entries. He also gets lorazepam.  He uses the hydrocodone approx 6 per day.  He may take a little less or a little more.  He wonders if I am willing to continue to rx this for him- I am, we had him do a contract today  Here today for a follow-up visit.  Reviewed NCCSR- no unexpected entries   He needs refill of his hydrocodone which he takes for chronic pain He just had a restaging at the [facility] last week; all his tumors are stable.  A tumor that was getting larger is now stable.  He has noted increased abd pain- this had improved when he started his new cancer drug.  He was pleased to find out that his tumor is not getting larger after all.   He is s/p appendectomy and chole, part of his colon and part of the right kidney  He has been having some right knee pain again- he had a steroid shot about 3 months ago per Landau's PA.  He saw  him 2 weeks ago and had an injection, this is getting better.   He has a lot of knee pain when he has to drive such extended distances [facility].   He has also started doing some exercises for his foot pain- per Dr. Mardelle Matte he likely has plantar fascitis.  This is getting better since he had his most recent knee injection.    He is on ativan 6m at bedtime- Dr. DEverlene Farriergave him 6 refills back in September so he is still using this  His energy level "comes and goes, I get a lot of fatigue."  He had hoped to use his boat and golf a lot during his retirement, but he does not quite have the energy for this most of the time.   He does not taste well, but is trying to keep his weight up. He does monitor his weight and tries to keep eating enough.  He is mostly eating chicken and eggs right now.      He does have some diarrhea at times, but this is expected from his partial colectomy.   He ate a little cereal today We will do a UDS today- he does smoke a little MJ at  times to help increase his appettie  Wt Readings from Last 3 Encounters:  05/29/16 190 lb 3.2 oz (86.3 kg)  03/08/16 188 lb 9.6 oz (85.5 kg)  03/06/16 191 lb (86.6 kg)    Patient Active Problem List   Diagnosis Date Noted  . CAD (coronary artery disease) 03/25/2014  . Protein-calorie malnutrition, severe ([place] 10/17/2013  . Complicated UTI (urinary tract infection) 10/16/2013  . Pyelonephritis 10/16/2013  . 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 Renal calculi   . GERD (gastroesophageal reflux disease)   . Allergic rhinitis   . Osteoarthritis   . Encounter for long-term (current) use of other medications   . Hyperlipidemia   . Tobacco use disorder   . COPD (chronic obstructive pulmonary disease) ([place]   . Unspecified vitamin D deficiency   . Hypertension     Past Medical History:  Diagnosis Date  . Allergic rhinitis   . Arthritis    "all over my body" (10/16/2013)  . Chronic back pain    "neck to lower back" (10/16/2013)  . Compression fracture    S/P L-SPINE; pt does not recall this hx on 10/16/2013  . COPD (chronic obstructive pulmonary disease) (HCC)    FVC .66  . Depression    "situational since 09/22/2013 OR"  . Encounter for long-term (current) use of other medications   . GERD (gastroesophageal reflux disease)   . Hemoptysis   . 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 History of blood transfusion 09/2013   "think so; not sure; related to big OR"  . History of surgical fusion joint    DDD C-SPINE  . Hyperlipidemia   . Hypertension   . Leiomyoma OF SKIN   INCREASED RISK RENAL CELL CA  NEEDS CT OF KIDNEYS EVERY 2 YEARS NEXT DUE  02/06/13  . Melanoma in situ ([place]   . Melanoma of back ([place]   . Neuromuscular disorder ([place]   . Osteoarthritis   . Renal calculi   . Renal cell carcinoma ([place]    type 2; papillary  . Tobacco use disorder   . Unspecified vitamin D deficiency     Past Surgical History:  Procedure  Laterality Date  . ANTERIOR CERVICAL DECOMP/DISCECTOMY FUSION  1992  .  ANTERIOR CRUCIATE LIGAMENT REPAIR Bilateral [phone]  . APPENDECTOMY  09/22/2013  . CHOLECYSTECTOMY  09/22/2013   "had tumors in it"  . HAND LIGAMENT RECONSTRUCTION Right   . HARVEST BONE GRAFT Right 1992   "hip; for neck fusion"  . INGUINAL HERNIA REPAIR Right 2004  . LIGAMENT REPAIR Right ~ 2001   "little finger"  . LYMPH NODE DISSECTION Right 09/22/2013   "in the area of the kidney; they were clean"  . MELANOMA EXCISION  ~ 2007   "off my back"  . PARTIAL NEPHRECTOMY Right 09/22/2013  . removal of fascia overlying the right psoas Right 09/22/2013  . RENAL ARTERY STENT Right 09/22/2013  . RIGHT COLECTOMY Right 09/22/2013  . ROBOTIC ASSITED PARTIAL NEPHRECTOMY  05/22/2012   Procedure: ROBOTIC ASSITED PARTIAL NEPHRECTOMY;  Surgeon: Alexis Frock, MD;  Location: [facility];  Service: Urology;  Laterality: Right;  Right Robotic Cyst Decortication and Partial Nephrectomy   . SPINE SURGERY      Social History  Substance Use Topics  . Smoking status: Former Smoker    Packs/day: 0.50    Years: 40.00    Types: Cigarettes  . Smokeless tobacco: Never Used     Comment: Pt chewing Nicorette gum currently  . Alcohol use 0.0 oz/week     Comment: 10/16/2013 "2-3 beers daily generally; nothing last month or so"    Family History  Problem Relation Age of Onset  . Adopted: Yes  . Hypertension Father     Allergies  Allergen Reactions  . Avelox [Moxifloxacin Hcl In Nacl] Hives, Shortness Of Breath and Swelling  . Moxifloxacin Hives    Medication list has been reviewed and updated.  Current Outpatient Prescriptions on File Prior to Visit  Medication Sig Dispense Refill  . amLODipine (NORVASC) 5 MG tablet Take 5 mg by mouth daily.    Marland Kitchen atorvastatin (LIPITOR) 10 MG tablet TAKE 1 BY MOUTH AT BEDTIME 90 tablet 3  . B Complex-C (B-COMPLEX WITH VITAMIN C) tablet Take 1 tablet by mouth daily.    . Bevacizumab  (AVASTIN) 100 MG/4ML SOLN Inject into the vein.    Marland Kitchen buPROPion (WELLBUTRIN XL) 150 MG 24 hr tablet Take 150 mg by mouth daily.    . calcium carbonate 1250 MG capsule Take 1,250 mg by mouth 2 (two) times daily with a meal.    . cetirizine (ZYRTEC) 10 MG tablet Take 10 mg by mouth daily.    . cholecalciferol (VITAMIN D) 1000 UNITS tablet Take 1,000 Units by mouth daily.    Marland Kitchen denosumab (XGEVA) 120 MG/1.7ML SOLN injection Inject 120 mg into the skin every 30 (thirty) days.     Marland Kitchen erlotinib (TARCEVA) 150 MG tablet Take 150 mg by mouth daily. Take on an empty stomach 1 hour before meals or 2 hours after    . HYDROcodone-acetaminophen (NORCO) 7.5-325 MG tablet 1 tablet every 4-6 hours for pain .prescription can be filled on on 04/06/2016 105 tablet 0  . HYDROcodone-acetaminophen (NORCO) 7.5-325 MG tablet 1 tablet every 4-6 hours for pain .prescription can be filled on 04/21/2016 105 tablet 0  . HYDROcodone-acetaminophen (NORCO) 7.5-325 MG tablet 1 tablet every 4-6 hours for pain .prescription can be filled on on 05/06/2016 105 tablet 0  . HYDROcodone-acetaminophen (NORCO) 7.5-325 MG tablet 1 tablet every 4-6 hours for pain .prescription can be filled on 05/21/2016 105 tablet 0  . LORazepam (ATIVAN) 0.5 MG tablet Take 1 tablet at night for sleep. Not to be taken with pain medication. (Patient  taking differently: Take 1 tablet at night for sleep every two weeks. Not to be taken with pain medication.) 30 tablet 5  . losartan (COZAAR) 50 MG tablet TAKE 1 BY MOUTH DAILY BEFORE BREAKFAST 90 tablet 1  . Multiple Vitamin (MULTIVITAMIN WITH MINERALS) TABS tablet Take 1 tablet by mouth daily.    . ondansetron (ZOFRAN ODT) 4 MG disintegrating tablet Take 1 tablet (4 mg total) by mouth every 8 (eight) hours as needed for nausea or vomiting. 20 tablet 5  . scopolamine (TRANSDERM-SCOP, 1.5 MG,) 1 MG/3DAYS Place 1 patch (1.5 mg total) onto the skin every 3 (three) days. 10 patch 0  . vitamin C (ASCORBIC ACID) 500 MG tablet  Take 500 mg by mouth daily.    Marland Kitchen zoster vaccine live, PF, (ZOSTAVAX) 28638 UNT/0.65ML injection Inject 19,400 Units into the skin once. 1 each 0   No current facility-administered medications on file prior to visit.     Review of Systems:  As per HPI- otherwise negative.   Physical Examination: Vitals:   05/29/16 1102  BP: 120/90  Pulse: 84  Temp: 98.2 F (36.8 C)   Vitals:   05/29/16 1102  Weight: 190 lb 3.2 oz (86.3 kg)  Height: _0  (1.854 m)   Body mass index is 25.09 kg/m. Ideal Body Weight: Weight in (lb) to have BMI = 25: 189.1  GEN: WDWN, NAD, Non-toxic, A & O x 3, normal weight, appears his normal self today HEENT: Atraumatic, Normocephalic. Neck supple. No masses, No LAD. Ears and Nose: No external deformity. CV: RRR, No M/G/R. No JVD. No thrill. No extra heart sounds. PULM: CTA B, no wheezes, crackles, rhonchi. No retractions. No resp. distress. No accessory muscle use. ABD: S, NT, ND EXTR: No c/c/e NEURO Normal gait.  PSYCH: Normally interactive. Conversant. Not depressed or anxious appearing.  Calm demeanor.    Assessment and Plan: Metastatic disease ([place])  Abdominal pain, right upper quadrant - Plan: HYDROcodone-acetaminophen (NORCO) 7.5-325 MG tablet, HYDROcodone-acetaminophen (NORCO) 7.5-325 MG tablet, HYDROcodone-acetaminophen (NORCO) 7.5-325 MG tablet, HYDROcodone-acetaminophen (NORCO) 7.5-325 MG tablet  Renal cell cancer, right (HCC) - Plan: HYDROcodone-acetaminophen (NORCO) 7.5-325 MG tablet, HYDROcodone-acetaminophen (NORCO) 7.5-325 MG tablet, HYDROcodone-acetaminophen (NORCO) 7.5-325 MG tablet, HYDROcodone-acetaminophen (NORCO) 7.5-325 MG tablet  Screening for diabetes mellitus - Plan: Hemoglobin A1c  Screening for prostate cancer - Plan: PSA  Hyperlipidemia, unspecified hyperlipidemia type - Plan: Lipid panel  Did his refills of hydrocodone for 2 months- he fills every 2 weeks  UDS today- he honestly admitted to me today that he does use MJ  at times in an attempt to increase his appetite.  Will also do basic labs for his as above  Signed Lamar Blinks, MD

## 2016-05-30 ENCOUNTER — Encounter: Payer: Self-pay | Admitting: Family Medicine

## 2016-06-03 ENCOUNTER — Ambulatory Visit (INDEPENDENT_AMBULATORY_CARE_PROVIDER_SITE_OTHER): Payer: BLUE CROSS/BLUE SHIELD | Admitting: Family Medicine

## 2016-06-03 ENCOUNTER — Other Ambulatory Visit: Payer: Self-pay | Admitting: Physician Assistant

## 2016-06-03 VITALS — BP 122/90 | HR 99 | Temp 98.4°F | Resp 18 | Ht 73.0 in | Wt 191.2 lb

## 2016-06-03 DIAGNOSIS — L600 Ingrowing nail: Secondary | ICD-10-CM

## 2016-06-03 MED ORDER — CEPHALEXIN 500 MG PO CAPS: 500.0000 mg | ORAL_CAPSULE | Freq: Two times a day (BID) | ORAL | 0 refills | Status: DC

## 2016-06-03 MED ORDER — MUPIROCIN CALCIUM 2 % EX CREA: 1.0000 "application " | TOPICAL_CREAM | Freq: Two times a day (BID) | CUTANEOUS | 0 refills | Status: DC

## 2016-06-03 MED ORDER — CLINDAMYCIN HCL 300 MG PO CAPS: 300.0000 mg | ORAL_CAPSULE | Freq: Three times a day (TID) | ORAL | 0 refills | Status: DC

## 2016-06-03 NOTE — Patient Instructions (Addendum)
Stop the Doxycycline while on Keflex.  Start the keflex 500 mg twice daily x 7 days.  If you start noticing any fevers, chills, or worsening pain/redness in that toe, come back and see [place].  Follow up with your oncologist as scheduled later this week.     IF you received an x-ray today, you will receive an invoice from [facility]. Please contact [facility] at [phone] with questions or concerns regarding your invoice.   IF you received labwork today, you will receive an invoice from [facility]. Please contact [facility] at [phone] with questions or concerns regarding your invoice.   Our billing staff will not be able to assist you with questions regarding bills from these companies.  You will be contacted with the lab results as soon as they are available. The fastest way to get your results is to activate your My Chart account. Instructions are located on the last page of this paperwork. If you have not heard from [place] regarding the results in 2 weeks, please contact this office.

## 2016-06-03 NOTE — Progress Notes (Signed)
Douglas [name] is a 60 y.o. male who presents to [facility] today for toe infection:  1.  Right 2nd toe infection:   Present for the past 3-4 days. He has attempted to squeeze pus from his toe several times." Of yellowish-green thick purulent material.  He has no redness past the end of his toe. It is painful. He has not taken anything for pain relief. He has been putting Neosporin on the area. No fevers or chills. No red streaks up his leg.  Past history of note he has stage IV disseminated renal cancer. He is on multiple experimental medications to the [facility]. They have warned against having any sort of procedures even minor. There is a risk of bleeding. They try to get him to stop fishing because of the risk of cutting himself and bleeding.    Of note one of his cancer medications causes severe acne. He has been on doxycycline 100 mg twice daily since February for treatment of this. He is continuing to take this.  ROS as above.     PMH reviewed. Patient is a nonsmoker.   Past Medical History:  Diagnosis Date  . Allergic rhinitis   . Arthritis    "all over my body" (10/16/2013)  . Chronic back pain    "neck to lower back" (10/16/2013)  . Compression fracture    S/P L-SPINE; pt does not recall this hx on 10/16/2013  . COPD (chronic obstructive pulmonary disease) (HCC)    FVC .66  . Depression    "situational since 09/22/2013 OR"  . Encounter for long-term (current) use of other medications   . GERD (gastroesophageal reflux disease)   . Hemoptysis   . 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 History of blood transfusion 09/2013   "think so; not sure; related to big OR"  . History of surgical fusion joint    DDD C-SPINE  . Hyperlipidemia   . Hypertension   . Leiomyoma OF SKIN   INCREASED RISK RENAL CELL CA  NEEDS CT OF KIDNEYS EVERY 2 YEARS NEXT DUE  02/06/13  . Melanoma in situ ([place])   . Melanoma of back ([place])   . Neuromuscular disorder ([place])     . Osteoarthritis   . Renal calculi   . Renal cell carcinoma ([place])    type 2; papillary  . Tobacco use disorder   . Unspecified vitamin D deficiency    Past Surgical History:  Procedure Laterality Date  . ANTERIOR CERVICAL DECOMP/DISCECTOMY FUSION  1992  . ANTERIOR CRUCIATE LIGAMENT REPAIR Bilateral [phone]  . APPENDECTOMY  09/22/2013  . CHOLECYSTECTOMY  09/22/2013   "had tumors in it"  . HAND LIGAMENT RECONSTRUCTION Right   . HARVEST BONE GRAFT Right 1992   "hip; for neck fusion"  . INGUINAL HERNIA REPAIR Right 2004  . LIGAMENT REPAIR Right ~ 2001   "little finger"  . LYMPH NODE DISSECTION Right 09/22/2013   "in the area of the kidney; they were clean"  . MELANOMA EXCISION  ~ 2007   "off my back"  . PARTIAL NEPHRECTOMY Right 09/22/2013  . removal of fascia overlying the right psoas Right 09/22/2013  . RENAL ARTERY STENT Right 09/22/2013  . RIGHT COLECTOMY Right 09/22/2013  . ROBOTIC ASSITED PARTIAL NEPHRECTOMY  05/22/2012   Procedure: ROBOTIC ASSITED PARTIAL NEPHRECTOMY;  Surgeon: Alexis Frock, MD;  Location: [facility];  Service: Urology;  Laterality: Right;  Right Robotic Cyst Decortication and Partial Nephrectomy   .  SPINE SURGERY      Medications reviewed.  He showed me the list of and information about chemotherapeutic medications he is taking.     Physical Exam:  BP 122/90 (BP Location: Right Arm, Patient Position: Sitting, Cuff Size: Large)   Pulse 99   Temp 98.4 F (36.9 C) (Oral)   Resp 18   Ht 6\' 1"  (1.854 m)   Wt 191 lb 3.2 oz (86.7 kg)   SpO2 97%   BMI 25.23 kg/m  Gen:  Alert, cooperative patient who appears stated age in no acute distress.  Vital signs reviewed. HEENT: EOMI,  MMM Skin:  Right 2nd toe with redness and some purulent drainage along lateral aspect of the nail. It is ingrown at this point. I was able to express a small amount of.  Assessment and Plan:  1.  Ingrown toenail: - already on doxycycline.  Initial plan was to switch to  clindamycin. -Patient contact his oncologist. They're concerned for drug interaction between several of his chemotherapeutic medications and clindamycin. -They okayed the toenail removal just before the maybe little bit more bleeding than usual but there shouldn't be any other complications. -We will switch to Keflex. As noted above he is being treated with doxycycline.  Stop this while on Keflex. - He has FU already scheduled with oncologist later this week.

## 2016-07-03 ENCOUNTER — Encounter: Payer: Self-pay | Admitting: Family Medicine

## 2016-07-21 ENCOUNTER — Telehealth: Payer: Self-pay | Admitting: Family Medicine

## 2016-07-21 NOTE — Telephone Encounter (Signed)
Pt called in because he said that he is at the pharmacy to get Rx for pain med Hydrocodone filled. He said that they can fill it but he will have to pay out of pocket because an authorization is needed from provider.  Updated pt's insurance information. Pt scheduled to come in to see PCP on Monday.

## 2016-07-24 ENCOUNTER — Encounter: Payer: Self-pay | Admitting: Family Medicine

## 2016-07-24 ENCOUNTER — Ambulatory Visit (INDEPENDENT_AMBULATORY_CARE_PROVIDER_SITE_OTHER): Payer: 59 | Admitting: Family Medicine

## 2016-07-24 VITALS — BP 120/83 | HR 90 | Temp 97.8°F | Ht 73.0 in | Wt 192.4 lb

## 2016-07-24 DIAGNOSIS — E782 Mixed hyperlipidemia: Secondary | ICD-10-CM

## 2016-07-24 DIAGNOSIS — R110 Nausea: Secondary | ICD-10-CM | POA: Diagnosis not present

## 2016-07-24 DIAGNOSIS — G893 Neoplasm related pain (acute) (chronic): Secondary | ICD-10-CM

## 2016-07-24 DIAGNOSIS — C7951 Secondary malignant neoplasm of bone: Secondary | ICD-10-CM

## 2016-07-24 DIAGNOSIS — I10 Essential (primary) hypertension: Secondary | ICD-10-CM

## 2016-07-24 DIAGNOSIS — R1011 Right upper quadrant pain: Secondary | ICD-10-CM

## 2016-07-24 DIAGNOSIS — C641 Malignant neoplasm of right kidney, except renal pelvis: Secondary | ICD-10-CM | POA: Diagnosis not present

## 2016-07-24 MED ORDER — HYDROCODONE-ACETAMINOPHEN 7.5-325 MG PO TABS: ORAL_TABLET | ORAL | 0 refills | Status: DC

## 2016-07-24 MED ORDER — ONDANSETRON 4 MG PO TBDP: 4.0000 mg | ORAL_TABLET | Freq: Three times a day (TID) | ORAL | 3 refills | Status: DC | PRN

## 2016-07-24 MED ORDER — ATORVASTATIN CALCIUM 10 MG PO TABS: ORAL_TABLET | ORAL | 3 refills | Status: DC

## 2016-07-24 MED ORDER — LOSARTAN POTASSIUM 50 MG PO TABS: ORAL_TABLET | ORAL | 3 refills | Status: DC

## 2016-07-24 NOTE — Progress Notes (Signed)
Pre visit review using our clinic review tool, if applicable. No additional management support is needed unless otherwise documented below in the visit note.   Atorvastatin, losartan and pain meds

## 2016-07-24 NOTE — Progress Notes (Signed)
[place] [facility] [address] [place], [place] [address] [phone]  Date:  07/24/2016   Name:  Douglas [name]   DOB:  [birth year]   MRN:  [id number]  PCP:  Lamar Blinks, MD    Chief Complaint: Follow-up (Pt will need new rx on atorvastatin, losartan and pain meds. Insurance plan is requesting authorizaton for Hydrocodone. )   History of Present Illness:  Douglas [name] is a 61 y.o. very pleasant male patient who presents with the following:  History of hereditary leiomyomatosis and metastatic renal cell cancer- he is managed by [facility] (as well as [facility] at [facility]) and has to travel to see them a lot.  This can be tiring but they make the best of it His recent MRI looked good- stable, no progression of his disease which is great  He feels like his mood is ok- he does feel down sometimes as this was not the "Douglas [name] years" he was hoping for himself.  However he knows that things could be worse and overall he is doing ok He is taking wellbutrin  Indication for chronic opioid: metastatic cancer pain Medication and dose: norco 7.5 every 4-6 hours as needed # pills per month: he gets 105 pills every approx 15 days Last UDS date: 05/29/16- low risk Pain contract signed (Y/N): 03/08/16 Date narcotic database last reviewed (include red flags): today- ok, nothing unexpected.    He has been on his hydrocodone for a long time- there is some sort of insurance issue now- I will [facility] and try to figure this out.  He fills it every 2 weeks and gives me dates for his next 4 refills    Patient Active Problem List   Diagnosis Date Noted  . CAD (coronary artery disease) 03/25/2014  . Protein-calorie malnutrition, severe ([place]) 10/17/2013  . 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 Renal calculi   . GERD (gastroesophageal reflux disease)   . Allergic rhinitis   . Osteoarthritis   . Encounter  for long-term (current) use of other medications   . Hyperlipidemia   . Tobacco use disorder   . COPD (chronic obstructive pulmonary disease) ([place])   . Unspecified vitamin D deficiency   . Hypertension     Past Medical History:  Diagnosis Date  . Allergic rhinitis   . Arthritis    "all over my body" (10/16/2013)  . Chronic back pain    "neck to lower back" (10/16/2013)  . Compression fracture    S/P L-SPINE; pt does not recall this hx on 10/16/2013  . COPD (chronic obstructive pulmonary disease) (HCC)    FVC .66  . Depression    "situational since 09/22/2013 OR"  . Encounter for long-term (current) use of other medications   . GERD (gastroesophageal reflux disease)   . Hemoptysis   . 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 History of blood transfusion 09/2013   "think so; not sure; related to big OR"  . History of surgical fusion joint    DDD C-SPINE  . Hyperlipidemia   . Hypertension   . Leiomyoma OF SKIN   INCREASED RISK RENAL CELL CA  NEEDS CT OF KIDNEYS EVERY 2 YEARS NEXT DUE  02/06/13  . Melanoma in situ ([place])   . Melanoma of back ([place])   . Neuromuscular disorder ([place])   . Osteoarthritis   . Renal calculi   . Renal cell  carcinoma ([place])    type 2; papillary  . Tobacco use disorder   . Unspecified vitamin D deficiency     Past Surgical History:  Procedure Laterality Date  . ANTERIOR CERVICAL DECOMP/DISCECTOMY FUSION  1992  . ANTERIOR CRUCIATE LIGAMENT REPAIR Bilateral [phone]  . APPENDECTOMY  09/22/2013  . CHOLECYSTECTOMY  09/22/2013   "had tumors in it"  . HAND LIGAMENT RECONSTRUCTION Right   . HARVEST BONE GRAFT Right 1992   "hip; for neck fusion"  . INGUINAL HERNIA REPAIR Right 2004  . LIGAMENT REPAIR Right ~ 2001   "little finger"  . LYMPH NODE DISSECTION Right 09/22/2013   "in the area of the kidney; they were clean"  . MELANOMA EXCISION  ~ 2007   "off my back"  . PARTIAL NEPHRECTOMY Right 09/22/2013  . removal of fascia  overlying the right psoas Right 09/22/2013  . RENAL ARTERY STENT Right 09/22/2013  . RIGHT COLECTOMY Right 09/22/2013  . ROBOTIC ASSITED PARTIAL NEPHRECTOMY  05/22/2012   Procedure: ROBOTIC ASSITED PARTIAL NEPHRECTOMY;  Surgeon: Alexis Frock, MD;  Location: [facility];  Service: Urology;  Laterality: Right;  Right Robotic Cyst Decortication and Partial Nephrectomy   . SPINE SURGERY      Social History  Substance Use Topics  . Smoking status: Former Smoker    Packs/day: 0.50    Years: 40.00    Types: Cigarettes  . Smokeless tobacco: Never Used     Comment: Pt chewing Nicorette gum currently  . Alcohol use 0.0 oz/week     Comment: 10/16/2013 "2-3 beers daily generally; nothing last month or so"    Family History  Problem Relation Age of Onset  . Adopted: Yes  . Hypertension Father     Allergies  Allergen Reactions  . Avelox [Moxifloxacin Hcl In Nacl] Hives, Shortness Of Breath and Swelling  . Moxifloxacin Hives    Medication list has been reviewed and updated.  Current Outpatient Prescriptions on File Prior to Visit  Medication Sig Dispense Refill  . amLODipine (NORVASC) 5 MG tablet Take 5 mg by mouth daily.    . B Complex-C (B-COMPLEX WITH VITAMIN C) tablet Take 1 tablet by mouth daily.    . Bevacizumab (AVASTIN) 100 MG/4ML SOLN Inject into the vein.    Marland Kitchen buPROPion (WELLBUTRIN XL) 150 MG 24 hr tablet Take 150 mg by mouth daily.    . calcium carbonate 1250 MG capsule Take 1,250 mg by mouth 2 (two) times daily with a meal.    . cephALEXin (KEFLEX) 500 MG capsule Take 1 capsule (500 mg total) by mouth 2 (two) times daily. 14 capsule 0  . cetirizine (ZYRTEC) 10 MG tablet Take 10 mg by mouth daily.    . cholecalciferol (VITAMIN D) 1000 UNITS tablet Take 1,000 Units by mouth daily.    . clindamycin (CLEOCIN) 300 MG capsule Take 1 capsule (300 mg total) by mouth 3 (three) times daily. X 7 days 21 capsule 0  . denosumab (XGEVA) 120 MG/1.7ML SOLN injection Inject 120 mg into the skin every  30 (thirty) days.     Marland Kitchen erlotinib (TARCEVA) 150 MG tablet Take 150 mg by mouth daily. Take on an empty stomach 1 hour before meals or 2 hours after    . HYDROcodone-acetaminophen (NORCO) 7.5-325 MG tablet 1 tablet every 4-6 hours for pain .prescription can be filled on on 07/04/2016 105 tablet 0  . HYDROcodone-acetaminophen (NORCO) 7.5-325 MG tablet 1 tablet every 4-6 hours for pain .prescription can be filled on  06/20/16 105 tablet 0  . HYDROcodone-acetaminophen (NORCO) 7.5-325 MG tablet 1 tablet every 4-6 hours for pain .prescription can be filled on on 06/05/16 105 tablet 0  . HYDROcodone-acetaminophen (NORCO) 7.5-325 MG tablet 1 tablet every 4-6 hours for pain .prescription can be filled on 07/20/2016 105 tablet 0  . LORazepam (ATIVAN) 0.5 MG tablet Take 1 tablet at night for sleep. Not to be taken with pain medication. (Patient taking differently: Take 1 tablet at night for sleep every two weeks. Not to be taken with pain medication.) 30 tablet 5  . Multiple Vitamin (MULTIVITAMIN WITH MINERALS) TABS tablet Take 1 tablet by mouth daily.    . mupirocin cream (BACTROBAN) 2 % Apply 1 application topically 2 (two) times daily. 15 g 0  . scopolamine (TRANSDERM-SCOP, 1.5 MG,) 1 MG/3DAYS Place 1 patch (1.5 mg total) onto the skin every 3 (three) days. 10 patch 0  . vitamin C (ASCORBIC ACID) 500 MG tablet Take 500 mg by mouth daily.    Marland Kitchen zoster vaccine live, PF, (ZOSTAVAX) 91478 UNT/0.65ML injection Inject 19,400 Units into the skin once. 1 each 0   No current facility-administered medications on file prior to visit.     Review of Systems:  As per HPI- otherwise negative.   Physical Examination: Vitals:   07/24/16 1142  BP: 120/83  Pulse: 90  Temp: 97.8 F (36.6 C)   Vitals:   07/24/16 1142  Weight: 192 lb 6.4 oz (87.3 kg)  Height: 6\' 1"  (1.854 m)   Body mass index is 25.38 kg/m. Ideal Body Weight: Weight in (lb) to have BMI = 25: 189.1  GEN: WDWN, NAD, Non-toxic, A & O x 3, looks well,  here with his wife Douglas [name] today HEENT: Atraumatic, Normocephalic. Neck supple. No masses, No LAD. Ears and Nose: No external deformity. CV: RRR, No M/G/R. No JVD. No thrill. No extra heart sounds. PULM: CTA B, no wheezes, crackles, rhonchi. No retractions. No resp. distress. No accessory muscle use. EXTR: No c/c/e NEURO Normal gait.  PSYCH: Normally interactive. Conversant. Not depressed or anxious appearing.  Calm demeanor.    Assessment and Plan: Renal cell cancer, right ([place]) - Plan: HYDROcodone-acetaminophen (NORCO) 7.5-325 MG tablet, HYDROcodone-acetaminophen (NORCO) 7.5-325 MG tablet, HYDROcodone-acetaminophen (NORCO) 7.5-325 MG tablet, HYDROcodone-acetaminophen (NORCO) 7.5-325 MG tablet  Mixed hyperlipidemia - Plan: atorvastatin (LIPITOR) 10 MG tablet  Essential hypertension - Plan: losartan (COZAAR) 50 MG tablet  Nausea - Plan: ondansetron (ZOFRAN ODT) 4 MG disintegrating tablet  Abdominal pain, right upper quadrant - Plan: HYDROcodone-acetaminophen (NORCO) 7.5-325 MG tablet, HYDROcodone-acetaminophen (NORCO) 7.5-325 MG tablet, HYDROcodone-acetaminophen (NORCO) 7.5-325 MG tablet, HYDROcodone-acetaminophen (NORCO) 7.5-325 MG tablet  Chronic pain due to neoplasm  Here today for a recheck and refills BP is under control, refilled cozaar Refilled lipitor- under reasonable control Called [facility] about his vicodin- the issue is that the number of pills corresponds with filling every 17 days- it will not pay if he fills earlier than this . Called pt and LMOM explaining this. If he needs me to we can change the number of pills he gets per rx  Lab Results  Component Value Date   CHOL 195 05/29/2016   HDL 68.90 05/29/2016   LDLCALC 72 03/25/2013   LDLDIRECT 65.0 05/29/2016   TRIG 356.0 (H) 05/29/2016   CHOLHDL 3 05/29/2016     Signed Lamar Blinks, MD

## 2016-07-24 NOTE — Patient Instructions (Signed)
It was good to see you today- I will call [facility] and try to figure out the issue with your pain medication

## 2016-07-25 ENCOUNTER — Encounter: Payer: Self-pay | Admitting: Family Medicine

## 2016-07-26 DIAGNOSIS — C7951 Secondary malignant neoplasm of bone: Secondary | ICD-10-CM | POA: Insufficient documentation

## 2016-07-27 MED ORDER — HYDROCODONE-ACETAMINOPHEN 7.5-325 MG PO TABS: ORAL_TABLET | ORAL | 0 refills | Status: DC

## 2016-07-27 NOTE — Addendum Note (Signed)
Addended by: Lamar Blinks C on: 07/27/2016 07:25 PM   Modules accepted: Orders

## 2016-07-31 ENCOUNTER — Telehealth: Payer: Self-pay | Admitting: Family Medicine

## 2016-07-31 NOTE — Telephone Encounter (Signed)
Pt dropped off old rx (4) for Hydrocodone-7.5 - 325 mg and was given the new rx that was at front desk. (the old prescription was put at shred bin)

## 2016-08-25 ENCOUNTER — Encounter: Payer: Self-pay | Admitting: Family Medicine

## 2016-08-26 NOTE — Progress Notes (Unsigned)
Script shredded Written 01/08/16

## 2016-09-03 ENCOUNTER — Encounter: Payer: Self-pay | Admitting: Family Medicine

## 2016-09-08 ENCOUNTER — Encounter: Payer: Self-pay | Admitting: Family Medicine

## 2016-09-09 MED ORDER — LORAZEPAM 0.5 MG PO TABS: ORAL_TABLET | ORAL | 5 refills | Status: DC

## 2016-09-25 ENCOUNTER — Ambulatory Visit (INDEPENDENT_AMBULATORY_CARE_PROVIDER_SITE_OTHER): Payer: 59 | Admitting: Family Medicine

## 2016-09-25 ENCOUNTER — Encounter: Payer: Self-pay | Admitting: Family Medicine

## 2016-09-25 DIAGNOSIS — R1011 Right upper quadrant pain: Secondary | ICD-10-CM | POA: Diagnosis not present

## 2016-09-25 DIAGNOSIS — C641 Malignant neoplasm of right kidney, except renal pelvis: Secondary | ICD-10-CM | POA: Diagnosis not present

## 2016-09-25 MED ORDER — HYDROCODONE-ACETAMINOPHEN 7.5-325 MG PO TABS: ORAL_TABLET | ORAL | 0 refills | Status: DC

## 2016-09-25 NOTE — Progress Notes (Addendum)
[place] at [facility] [address], [place], [place] [address] [phone]  Date:  09/25/2016   Name:  Douglas [name]   DOB:  10-06-1955   MRN:  [id number]  PCP:  Douglas Blinks, MD    Chief Complaint: Medication Discussion (Pt here to dicuss pain meds. Will need refills.)   History of Present Illness:  Douglas [name] is a 61 y.o. very pleasant male patient who presents with the following:  Here today to discuss Hep C screening and also wanting to discuss his medications.  History of cancer with bone mets for which he uses chronic narcotic pain medication  Part of HPI from last visit 07/24/16:  History of hereditary leiomyomatosis and metastatic renal cell cancer- he [facility] (as well as oncology at [facility]) and has to travel to see them a lot.  This can be tiring but they make the best of it His recent MRI looked good- stable, no progression of his disease which is great  He feels like his mood is ok- he does feel down sometimes as this was not the "Douglas [name] years" he was hoping for himself.  However he knows that things could be worse and overall he is doing ok He is taking wellbutrin  Indication for chronic opioid: metastatic cancer pain Medication and dose: norco 7.5 every 4-6 hours as needed # pills per month: he gets 105 pills every approx 15 days Last UDS date: 05/29/16- low risk Pain contract signed (Y/N): 03/08/16 Date narcotic database last reviewed (include red flags): today- ok, nothing unexpected.    He is having a hard time getting his pain meds covered but we think he has been able to work this out with [employer]- he will keep me posted  He is going to the [place] next week He went to a new cancer support group a couple of weeks ago- he thought it was helpful to him.  He has another support group that he also attends on a regular basis.  In addition to getting support he is able to share his  significant knowledge about certain cancer drugs and treatments with others   He generally gets his pain pills every 2 weeks Hydrocodone 7.5- he takes 1.5 tabs about 4x a day His energy level is ok- not as good as he would like but he tries to stay active.  He will get really worn out from things like cleaning or mowing the lawn  BP Readings from Last 3 Encounters:  09/25/16 102/72  07/24/16 120/83  06/03/16 122/90    Patient Active Problem List   Diagnosis Date Noted  . Bone metastases ([place]) 07/26/2016  . CAD (coronary artery disease) 03/25/2014  . Protein-calorie malnutrition, severe ([place]) 10/17/2013  . 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 Renal calculi   . GERD (gastroesophageal reflux disease)   . Allergic rhinitis   . Osteoarthritis   . Encounter for long-term (current) use of other medications   . Hyperlipidemia   . Tobacco use disorder   . COPD (chronic obstructive pulmonary disease) ([place])   . Unspecified vitamin D deficiency   . Hypertension     Past Medical History:  Diagnosis Date  . Allergic rhinitis   . Arthritis    "all over my body" (10/16/2013)  . Chronic back pain    "neck to lower back" (10/16/2013)  . Compression fracture    S/P L-SPINE; pt does  not recall this hx on 10/16/2013  . COPD (chronic obstructive pulmonary disease) (HCC)    FVC .66  . Depression    "situational since 09/22/2013 OR"  . Encounter for long-term (current) use of other medications   . GERD (gastroesophageal reflux disease)   . Hemoptysis   . 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 History of blood transfusion 09/2013   "think so; not sure; related to big OR"  . History of surgical fusion joint    DDD C-SPINE  . Hyperlipidemia   . Hypertension   . Leiomyoma OF SKIN   INCREASED RISK RENAL CELL CA  NEEDS CT OF KIDNEYS EVERY 2 YEARS NEXT DUE  02/06/13  . Melanoma in situ ([place])   . Melanoma of back ([place])   . Neuromuscular  disorder ([place])   . Osteoarthritis   . Renal calculi   . Renal cell carcinoma ([place])    type 2; papillary  . Tobacco use disorder   . Unspecified vitamin D deficiency     Past Surgical History:  Procedure Laterality Date  . ANTERIOR CERVICAL DECOMP/DISCECTOMY FUSION  1992  . ANTERIOR CRUCIATE LIGAMENT REPAIR Bilateral [phone]  . APPENDECTOMY  09/22/2013  . CHOLECYSTECTOMY  09/22/2013   "had tumors in it"  . HAND LIGAMENT RECONSTRUCTION Right   . HARVEST BONE GRAFT Right 1992   "hip; for neck fusion"  . INGUINAL HERNIA REPAIR Right 2004  . LIGAMENT REPAIR Right ~ 2001   "little finger"  . LYMPH NODE DISSECTION Right 09/22/2013   "in the area of the kidney; they were clean"  . MELANOMA EXCISION  ~ 2007   "off my back"  . PARTIAL NEPHRECTOMY Right 09/22/2013  . removal of fascia overlying the right psoas Right 09/22/2013  . RENAL ARTERY STENT Right 09/22/2013  . RIGHT COLECTOMY Right 09/22/2013  . ROBOTIC ASSITED PARTIAL NEPHRECTOMY  05/22/2012   Procedure: ROBOTIC ASSITED PARTIAL NEPHRECTOMY;  Surgeon: Douglas Frock, MD;  Location: [facility];  Service: Urology;  Laterality: Right;  Right Robotic Cyst Decortication and Partial Nephrectomy   . SPINE SURGERY      Social History  Substance Use Topics  . Smoking status: Former Smoker    Packs/day: 0.50    Years: 40.00    Types: Cigarettes  . Smokeless tobacco: Never Used     Comment: Pt chewing Nicorette gum currently  . Alcohol use 0.0 oz/week     Comment: 10/16/2013 "2-3 beers daily generally; nothing last month or so"    Family History  Problem Relation Age of Onset  . Adopted: Yes  . Hypertension Father     Allergies  Allergen Reactions  . Avelox [Moxifloxacin Hcl In Nacl] Hives, Shortness Of Breath and Swelling  . Moxifloxacin Hives  . Skin Adhesives Dermatitis and Rash    Medication list has been reviewed and updated.  Current Outpatient Prescriptions on File Prior to Visit  Medication Sig Dispense  Refill  . amLODipine (NORVASC) 5 MG tablet TAKE 7.5 MG BY MOUTH DAILY.    Marland Kitchen atorvastatin (LIPITOR) 10 MG tablet TAKE 1 BY MOUTH AT BEDTIME 90 tablet 3  . B Complex-C (B-COMPLEX WITH VITAMIN C) tablet Take 1 tablet by mouth daily.    . Bevacizumab (AVASTIN) 100 MG/4ML SOLN Inject into the vein.    Marland Kitchen buPROPion (WELLBUTRIN XL) 150 MG 24 hr tablet Take 150 mg by mouth daily.    . calcium carbonate 1250 MG capsule Take 1,250 mg by mouth 2 (  two) times daily with a meal.    . cetirizine (ZYRTEC) 10 MG tablet Take 10 mg by mouth daily.    . cholecalciferol (VITAMIN D) 1000 UNITS tablet Take 1,000 Units by mouth daily.    Marland Kitchen denosumab (XGEVA) 120 MG/1.7ML SOLN injection Inject 120 mg into the skin every 30 (thirty) days.     Marland Kitchen erlotinib (TARCEVA) 150 MG tablet Take 150 mg by mouth daily. Take on an empty stomach 1 hour before meals or 2 hours after    . LORazepam (ATIVAN) 0.5 MG tablet Take 1 tablet at night for sleep. Not to be taken with pain medication. 30 tablet 5  . losartan (COZAAR) 50 MG tablet TAKE 1 BY MOUTH DAILY BEFORE BREAKFAST 90 tablet 3  . Multiple Vitamin (MULTIVITAMIN WITH MINERALS) TABS tablet Take 1 tablet by mouth daily.    . ondansetron (ZOFRAN ODT) 4 MG disintegrating tablet Take 1 tablet (4 mg total) by mouth every 8 (eight) hours as needed for nausea or vomiting. This is a 90 day supply 40 tablet 3  . vitamin C (ASCORBIC ACID) 500 MG tablet Take 500 mg by mouth daily.    Marland Kitchen zoster vaccine live, PF, (ZOSTAVAX) 16073 UNT/0.65ML injection Inject 19,400 Units into the skin once. 1 each 0   No current facility-administered medications on file prior to visit.     Review of Systems:  As per HPI- otherwise negative.   Physical Examination: Vitals:   09/25/16 1318  BP: 102/72  Pulse: 87  Temp: 98.1 F (36.7 C)   Vitals:   09/25/16 1318  Weight: 188 lb 3.2 oz (85.4 kg)  Height: 6\' 1"  (1.854 m)   Body mass index is 24.83 kg/m. Ideal Body Weight: Weight in (lb) to have BMI =  25: 189.1  GEN: WDWN, NAD, Non-toxic, A & O x 3, looks well, here with Christine as usual.   HEENT: Atraumatic, Normocephalic. Neck supple. No masses, No LAD. Ears and Nose: No external deformity. CV: RRR, No M/G/R. No JVD. No thrill. No extra heart sounds. PULM: CTA B, no wheezes, crackles, rhonchi. No retractions. No resp. distress. No accessory muscle use. EXTR: No c/c/e NEURO Normal gait.  PSYCH: Normally interactive. Conversant. Not depressed or anxious appearing.  Calm demeanor.    Assessment and Plan:  Abdominal pain, right upper quadrant - Plan: HYDROcodone-acetaminophen (NORCO) 7.5-325 MG tablet, HYDROcodone-acetaminophen (NORCO) 7.5-325 MG tablet, HYDROcodone-acetaminophen (NORCO) 7.5-325 MG tablet, HYDROcodone-acetaminophen (NORCO) 7.5-325 MG tablet  Renal cell cancer, right (HCC) - Plan: HYDROcodone-acetaminophen (NORCO) 7.5-325 MG tablet, HYDROcodone-acetaminophen (NORCO) 7.5-325 MG tablet, HYDROcodone-acetaminophen (NORCO) 7.5-325 MG tablet, HYDROcodone-acetaminophen (NORCO) 7.5-325 MG tablet  Refilled pain medications- 4 rx, he fills every 2 weeks.  He has noted that his BP is a bit low and we are not sure if he was ever screening for hep C.  He will discuss these 2 concerns with the [facility] at his next visit.  He does not want to alter his BP regimen without consulting them first  He and [name] have planned a major trip to [place] in June which they are very excited about. He is concerned because the timing of his trip will necessitate him picking up his pain meds a few days early (will be off schedule).  Reassured that I am glad to work with his pharmacy as needed to ensure he is able to go on this trip.  He will let me know what he may need as the time get closesr   Signed Douglas Blinks, MD

## 2016-09-25 NOTE — Patient Instructions (Addendum)
I would suspect that you have been tested for Hep C at some point; please ask them at the [facility]. If you have not ever been screened they maybe able to do this for you or we could test you  Ask the [facility] about if you should adjust your BP meds- your BP may be running a little too low  Keep an eye on your BP at home and write down some readings- take your list with you to the [facility]  BP Readings from Last 3 Encounters:  09/25/16 102/72  07/24/16 120/83  06/03/16 122/90   We will continue your current pain regimen for now- we could consider changing it to the 10 mg strength eventually if need be

## 2016-10-03 ENCOUNTER — Encounter: Payer: Self-pay | Admitting: Family Medicine

## 2016-11-20 ENCOUNTER — Ambulatory Visit (INDEPENDENT_AMBULATORY_CARE_PROVIDER_SITE_OTHER): Payer: 59 | Admitting: Family Medicine

## 2016-11-20 VITALS — BP 112/82 | HR 81 | Temp 98.5°F | Ht 73.0 in | Wt 184.0 lb

## 2016-11-20 DIAGNOSIS — Z1159 Encounter for screening for other viral diseases: Secondary | ICD-10-CM

## 2016-11-20 DIAGNOSIS — R110 Nausea: Secondary | ICD-10-CM | POA: Diagnosis not present

## 2016-11-20 DIAGNOSIS — C641 Malignant neoplasm of right kidney, except renal pelvis: Secondary | ICD-10-CM

## 2016-11-20 DIAGNOSIS — R1011 Right upper quadrant pain: Secondary | ICD-10-CM | POA: Diagnosis not present

## 2016-11-20 MED ORDER — ONDANSETRON 4 MG PO TBDP: 4.0000 mg | ORAL_TABLET | Freq: Three times a day (TID) | ORAL | 3 refills | Status: DC | PRN

## 2016-11-20 MED ORDER — HYDROCODONE-ACETAMINOPHEN 7.5-325 MG PO TABS: ORAL_TABLET | ORAL | 0 refills | Status: DC

## 2016-11-20 NOTE — Patient Instructions (Addendum)
Please let me know if you want me to schedule a colonoscopy for you. We might also be able to get you a cologuard test instead if appropriate.    We will get your hep C test today

## 2016-11-20 NOTE — Progress Notes (Signed)
[place] at [facility] [address], [place], [place] [address] [phone]  Date:  11/20/2016   Name:  Douglas [name]   DOB:  10-28-1955   MRN:  [id number]  PCP:  Darreld Mclean, MD    Chief Complaint: Follow-up (Pt here meds and drug trial for CA f/u. Will be going to [place] soon.)   History of Present Illness:  Douglas [name] is a 61 y.o. very pleasant male patient who presents with the following:  Here today for a follow-up visit He was recently at the [facility] and they found a new possible mass in his lung- they will look at this again in August.  He is worried about this but understands that it is too early to know if this will turn out to be anything of significance  Last seen here in April Indication for chronic opioid: bone mets from cancer Medication and dose: norco 7.5, 1-2 every 4-6h # pills per month: 210 Last UDS date: 05/29/16- low risk Pain contract signed (Y/N): 05/2016 Date narcotic database last reviewed (include red flags): today- none  BP Readings from Last 3 Encounters:  11/20/16 112/82  09/25/16 102/72  07/24/16 120/83   Wt Readings from Last 3 Encounters:  11/20/16 184 lb (83.5 kg)  09/25/16 188 lb 3.2 oz (85.4 kg)  07/24/16 192 lb 6.4 oz (87.3 kg)   He is down a few lbs, he is trying to eat more and is pushing calories.  He has been feeling more down recently- he has not been able to go fishing.  The weather has not been great- Judson is aware that his future is not certain and likes to enjoy every day  He was seen by his orthopedist Dr. Mardelle Matte not long ago and they treated him for his back pain- used some prednisone which really helped him.  His back is overall better  Saw his derm this last week- he does have a basal cell on his head which they may remove for him soon  He and [name] are planning a trip to [place] next month which they are really looking forward to  Harlyn has always gotten  his pain medication every 2 weeks; this is a routine that he and Dr. Everlene Farrier had worked out.  However this schedule is getting to be harder for him to keep up and he would like to change to monthly rx Patient Active Problem List   Diagnosis Date Noted  . Bone metastases ([place]) 07/26/2016  . CAD (coronary artery disease) 03/25/2014  . Protein-calorie malnutrition, severe ([place]) 10/17/2013  . 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 Renal calculi   . GERD (gastroesophageal reflux disease)   . Allergic rhinitis   . Osteoarthritis   . Encounter for long-term (current) use of other medications   . Hyperlipidemia   . Tobacco use disorder   . COPD (chronic obstructive pulmonary disease) ([place])   . Unspecified vitamin D deficiency   . Hypertension     Past Medical History:  Diagnosis Date  . Allergic rhinitis   . Arthritis    "all over my body" (10/16/2013)  . Chronic back pain    "neck to lower back" (10/16/2013)  . Compression fracture    S/P L-SPINE; pt does not recall this hx on 10/16/2013  . COPD (chronic obstructive pulmonary disease) (HCC)    FVC .66  . Depression    "situational since 09/22/2013 OR"  .  Encounter for long-term (current) use of other medications   . GERD (gastroesophageal reflux disease)   . Hemoptysis   . 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 History of blood transfusion 09/2013   "think so; not sure; related to big OR"  . History of surgical fusion joint    DDD C-SPINE  . Hyperlipidemia   . Hypertension   . Leiomyoma OF SKIN   INCREASED RISK RENAL CELL CA  NEEDS CT OF KIDNEYS EVERY 2 YEARS NEXT DUE  02/06/13  . Melanoma in situ ([place])   . Melanoma of back ([place])   . Neuromuscular disorder ([place])   . Osteoarthritis   . Renal calculi   . Renal cell carcinoma ([place])    type 2; papillary  . Tobacco use disorder   . Unspecified vitamin D deficiency     Past Surgical History:  Procedure Laterality Date  . ANTERIOR  CERVICAL DECOMP/DISCECTOMY FUSION  1992  . ANTERIOR CRUCIATE LIGAMENT REPAIR Bilateral [phone]  . APPENDECTOMY  09/22/2013  . CHOLECYSTECTOMY  09/22/2013   "had tumors in it"  . HAND LIGAMENT RECONSTRUCTION Right   . HARVEST BONE GRAFT Right 1992   "hip; for neck fusion"  . INGUINAL HERNIA REPAIR Right 2004  . LIGAMENT REPAIR Right ~ 2001   "little finger"  . LYMPH NODE DISSECTION Right 09/22/2013   "in the area of the kidney; they were clean"  . MELANOMA EXCISION  ~ 2007   "off my back"  . PARTIAL NEPHRECTOMY Right 09/22/2013  . removal of fascia overlying the right psoas Right 09/22/2013  . RENAL ARTERY STENT Right 09/22/2013  . RIGHT COLECTOMY Right 09/22/2013  . ROBOTIC ASSITED PARTIAL NEPHRECTOMY  05/22/2012   Procedure: ROBOTIC ASSITED PARTIAL NEPHRECTOMY;  Surgeon: Alexis Frock, MD;  Location: [facility];  Service: Urology;  Laterality: Right;  Right Robotic Cyst Decortication and Partial Nephrectomy   . SPINE SURGERY      Social History  Substance Use Topics  . Smoking status: Former Smoker    Packs/day: 0.50    Years: 40.00    Types: Cigarettes  . Smokeless tobacco: Never Used     Comment: Pt chewing Nicorette gum currently  . Alcohol use 0.0 oz/week     Comment: 10/16/2013 "2-3 beers daily generally; nothing last month or so"    Family History  Problem Relation Age of Onset  . Adopted: Yes  . Hypertension Father     Allergies  Allergen Reactions  . Avelox [Moxifloxacin Hcl In Nacl] Hives, Shortness Of Breath and Swelling  . Moxifloxacin Hives  . Skin Adhesives Dermatitis and Rash    Medication list has been reviewed and updated.  Current Outpatient Prescriptions on File Prior to Visit  Medication Sig Dispense Refill  . amLODipine (NORVASC) 5 MG tablet TAKE 7.5 MG BY MOUTH DAILY.    Marland Kitchen atorvastatin (LIPITOR) 10 MG tablet TAKE 1 BY MOUTH AT BEDTIME 90 tablet 3  . B Complex-C (B-COMPLEX WITH VITAMIN C) tablet Take 1 tablet by mouth daily.    .  Bevacizumab (AVASTIN) 100 MG/4ML SOLN Inject into the vein.    Marland Kitchen buPROPion (WELLBUTRIN XL) 150 MG 24 hr tablet Take 150 mg by mouth daily.    . calcium carbonate 1250 MG capsule Take 1,250 mg by mouth 2 (two) times daily with a meal.    . cetirizine (ZYRTEC) 10 MG tablet Take 10 mg by mouth daily.    . cholecalciferol (VITAMIN D) 1000 UNITS tablet  Take 1,000 Units by mouth daily.    Marland Kitchen denosumab (XGEVA) 120 MG/1.7ML SOLN injection Inject 120 mg into the skin every 30 (thirty) days.     Marland Kitchen erlotinib (TARCEVA) 150 MG tablet Take 150 mg by mouth daily. Take on an empty stomach 1 hour before meals or 2 hours after    . HYDROcodone-acetaminophen (NORCO) 7.5-325 MG tablet 1-2 tablets every 4-6 hours for pain .prescription can be filled on 10/19/16 105 tablet 0  . HYDROcodone-acetaminophen (NORCO) 7.5-325 MG tablet 1 -2 tablets every 4-6 hours for pain .prescription can be filled on on 11/03/16 105 tablet 0  . LORazepam (ATIVAN) 0.5 MG tablet Take 1 tablet at night for sleep. Not to be taken with pain medication. 30 tablet 5  . losartan (COZAAR) 50 MG tablet TAKE 1 BY MOUTH DAILY BEFORE BREAKFAST 90 tablet 3  . Multiple Vitamin (MULTIVITAMIN WITH MINERALS) TABS tablet Take 1 tablet by mouth daily.    . vitamin C (ASCORBIC ACID) 500 MG tablet Take 500 mg by mouth daily.    Marland Kitchen zoster vaccine live, PF, (ZOSTAVAX) 70177 UNT/0.65ML injection Inject 19,400 Units into the skin once. 1 each 0   No current facility-administered medications on file prior to visit.     Review of Systems:  As per HPI- otherwise negative. No fever or chills  Physical Examination: Vitals:   11/20/16 1113  BP: 112/82  Pulse: 81  Temp: 98.5 F (36.9 C)   Vitals:   11/20/16 1113  Weight: 184 lb (83.5 kg)  Height: 6\' 1"  (1.854 m)   Body mass index is 24.28 kg/m. Ideal Body Weight: Weight in (lb) to have BMI = 25: 189.1  GEN: WDWN, NAD, Non-toxic, A & O x 3, looks well but has lost a few lbs.  Here with his wife [name] as  usual HEENT: Atraumatic, Normocephalic. Neck supple. No masses, No LAD. Ears and Nose: No external deformity. CV: RRR, No M/G/R. No JVD. No thrill. No extra heart sounds. PULM: CTA B, no wheezes, crackles, rhonchi. No retractions. No resp. distress. No accessory muscle use. EXTR: No c/c/e NEURO Normal gait.  PSYCH: Normally interactive. Conversant. Not depressed or anxious appearing.  Calm demeanor.    Assessment and Plan: Encounter for hepatitis C screening test for low risk patient - Plan: Hepatitis C antibody  Abdominal pain, right upper quadrant - Plan: HYDROcodone-acetaminophen (NORCO) 7.5-325 MG tablet, HYDROcodone-acetaminophen (NORCO) 7.5-325 MG tablet  Renal cell cancer, right ([place]) - Plan: HYDROcodone-acetaminophen (NORCO) 7.5-325 MG tablet, HYDROcodone-acetaminophen (NORCO) 7.5-325 MG tablet  Nausea - Plan: ondansetron (ZOFRAN ODT) 4 MG disintegrating tablet  Refilled norco for this month and next today Refilled zofran that he uses prn He would like hep C screening- will do today He plans to ask his cancer team about colon cancer screening and will let me know if he needs a referral  Signed [place] Blinks, MD

## 2016-11-21 LAB — HEPATITIS C ANTIBODY: HCV Ab: NEGATIVE

## 2016-12-01 ENCOUNTER — Other Ambulatory Visit: Payer: Self-pay | Admitting: Emergency Medicine

## 2016-12-28 ENCOUNTER — Encounter: Payer: Self-pay | Admitting: Family Medicine

## 2016-12-28 DIAGNOSIS — Z1211 Encounter for screening for malignant neoplasm of colon: Secondary | ICD-10-CM

## 2016-12-29 ENCOUNTER — Encounter: Payer: Self-pay | Admitting: Gastroenterology

## 2017-01-20 ENCOUNTER — Encounter: Payer: Self-pay | Admitting: Family Medicine

## 2017-01-20 DIAGNOSIS — C641 Malignant neoplasm of right kidney, except renal pelvis: Secondary | ICD-10-CM

## 2017-01-20 DIAGNOSIS — R1011 Right upper quadrant pain: Secondary | ICD-10-CM

## 2017-01-22 MED ORDER — HYDROCODONE-ACETAMINOPHEN 7.5-325 MG PO TABS: ORAL_TABLET | ORAL | 0 refills | Status: DC

## 2017-01-22 NOTE — Telephone Encounter (Signed)
Indication for chronic opioid: renal cell carcinoma with mets Medication and dose: norco 7.5  # pills per month: 210 Last UDS date: 12/17  Pain contract signed (Y/N): 9/17  Date narcotic database last reviewed (include red flags): today  NCCSR:filled 7/16 and 6/17- no unexpected entries

## 2017-02-08 ENCOUNTER — Encounter: Payer: Self-pay | Admitting: Family Medicine

## 2017-02-20 ENCOUNTER — Encounter: Payer: Self-pay | Admitting: Gastroenterology

## 2017-02-20 ENCOUNTER — Ambulatory Visit (INDEPENDENT_AMBULATORY_CARE_PROVIDER_SITE_OTHER): Payer: 59 | Admitting: Gastroenterology

## 2017-02-20 VITALS — BP 118/78 | HR 93 | Ht 72.0 in | Wt 177.0 lb

## 2017-02-20 DIAGNOSIS — Z86010 Personal history of colonic polyps: Secondary | ICD-10-CM

## 2017-02-20 DIAGNOSIS — C649 Malignant neoplasm of unspecified kidney, except renal pelvis: Secondary | ICD-10-CM | POA: Diagnosis not present

## 2017-02-20 DIAGNOSIS — C799 Secondary malignant neoplasm of unspecified site: Secondary | ICD-10-CM

## 2017-02-20 NOTE — Progress Notes (Signed)
HPI :  61 y/o Douglas [name] with a history of hereditary leiomyomatosis and renal cell cancer on chemotherapy to include Avastin, Tarceva, Denosumab, seen in consultation from Dr. Janett Billow Copland to discuss when he is next due for his colonoscopy. Last colonoscopy in 2013 with 2 small adenomas.  He is in [facility] condition, he goes there every 12 days for Avastin and his other chemotherapy regimen. He has had multiple surgeries, told he is not having any further surgeries due to progressive disease. He reports his kidney cancer remains present and has metastatic disease, he is not in remission, but stable since last fall. He is not having further surgery for his kidney cancer. He is not aware of any growths in his colon. He has had his right colon and appendix removed as part of his prior surgeries. He is not having any blood in his stools. He has some loose stools at times following his surgery - he has used immodium PRN which works well. His stool frequency varies, at least 3BMs per day, sometimes more.   His last CT scan was 3 weeks ago and patient states shows stable disease, but no records of this on file Prior CT scan on 5/22 showed stable findings  Last 5/21 - CBC - Hgb Douglas.8, plt 246, WBC 8.6, LFTS, BMEt normal  Colonoscopy 10/09/2011 - 2 small adenomas, multiple left sided hyperplastic polyps  Past Medical History:  Diagnosis Date  . Allergic rhinitis   . Arthritis    "all over my body" (10/16/2013)  . Chronic back pain    "neck to lower back" (10/16/2013)  . Compression fracture    S/P L-SPINE; pt does not recall this hx on 10/16/2013  . COPD (chronic obstructive pulmonary disease) (HCC)    FVC .66  . Depression    "situational since 09/22/2013 OR"  . Encounter for long-term (current) use of other medications   . GERD (gastroesophageal reflux disease)   . Hemoptysis   . 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 History of blood transfusion  09/2013   "think so; not sure; related to big OR"  . History of surgical fusion joint    DDD C-SPINE  . Hyperlipidemia   . Hypertension   . Leiomyoma OF SKIN   INCREASED RISK RENAL CELL CA  NEEDS CT OF KIDNEYS EVERY 2 YEARS NEXT DUE  08/21/Douglas  . Melanoma in situ ([place])   . Melanoma of back ([place])   . Neuromuscular disorder ([place])   . Osteoarthritis   . Renal calculi   . Renal cell carcinoma ([place])    type 2; papillary  . Tobacco use disorder   . Unspecified vitamin D deficiency      Past Surgical History:  Procedure Laterality Date  . ANTERIOR CERVICAL DECOMP/DISCECTOMY FUSION  1992  . ANTERIOR CRUCIATE LIGAMENT REPAIR Bilateral [phone]  . APPENDECTOMY  09/22/2013  . CHOLECYSTECTOMY  09/22/2013   "had tumors in it"  . HAND LIGAMENT RECONSTRUCTION Right   . HARVEST BONE GRAFT Right 1992   "hip; for neck fusion"  . INGUINAL HERNIA REPAIR Right 2004  . LIGAMENT REPAIR Right ~ 2001   "little finger"  . LYMPH NODE DISSECTION Right 09/22/2013   "in the area of the kidney; they were clean"  . MELANOMA EXCISION  ~ 2007   "off my back"  . PARTIAL NEPHRECTOMY Right 09/22/2013  . removal of fascia overlying the right psoas Right 09/22/2013  .  RENAL ARTERY STENT Right 09/22/2013  . RIGHT COLECTOMY Right 09/22/2013  . ROBOTIC ASSITED PARTIAL NEPHRECTOMY  05/22/2012   Procedure: ROBOTIC ASSITED PARTIAL NEPHRECTOMY;  Surgeon: Alexis Frock, MD;  Location: [facility];  Service: Urology;  Laterality: Right;  Right Robotic Cyst Decortication and Partial Nephrectomy   . SPINE SURGERY     Family History  Problem Relation Age of Onset  . Adopted: Yes  . Hypertension Father    Social History  Substance Use Topics  . Smoking status: Former Smoker    Packs/day: 0.50    Years: 40.00    Types: Cigarettes  . Smokeless tobacco: Never Used     Comment: Pt chewing Nicorette gum currently  . Alcohol use 0.0 oz/week     Comment: 10/16/2013 "2-3 beers daily generally; nothing last month or so"    Current Outpatient Prescriptions  Medication Sig Dispense Refill  . amLODipine (NORVASC) 5 MG tablet TAKE 7.5 MG BY MOUTH DAILY.    Marland Kitchen atorvastatin (LIPITOR) 10 MG tablet TAKE 1 BY MOUTH AT BEDTIME 90 tablet 3  . B Complex Vitamins (B COMPLEX 100 PO) Take 100 mg by mouth daily.    . Bevacizumab (AVASTIN) 100 MG/4ML SOLN Inject into the vein.    Marland Kitchen buPROPion (WELLBUTRIN XL) 150 MG 24 hr tablet Take 150 mg by mouth daily.    . calcium carbonate 1250 MG capsule Take 1,250 mg by mouth 2 (two) times daily with a meal.    . cetirizine (ZYRTEC) 10 MG tablet Take 10 mg by mouth daily.    . cholecalciferol (VITAMIN D) 1000 UNITS tablet Take 1,000 Units by mouth daily.    Marland Kitchen denosumab (XGEVA) 120 MG/1.7ML SOLN injection Inject 120 mg into the skin every 30 (thirty) days.     Marland Kitchen erlotinib (TARCEVA) 150 MG tablet Take 150 mg by mouth daily. Take on an empty stomach 1 hour before meals or 2 hours after    . HYDROcodone-acetaminophen (NORCO) 7.5-325 MG tablet 1-2 tablets every 4-6 hours for pain .prescription can be filled on 9/Douglas/18 210 tablet 0  . loperamide (IMODIUM A-D) 2 MG tablet Take 2 mg by mouth 4 (four) times daily as needed for diarrhea or loose stools.    Marland Kitchen LORazepam (ATIVAN) 0.5 MG tablet Take 1 tablet at night for sleep. Not to be taken with pain medication. 30 tablet 5  . losartan (COZAAR) 50 MG tablet TAKE 1 BY MOUTH DAILY BEFORE BREAKFAST 90 tablet 3  . Multiple Vitamins-Minerals (CENTRUM SILVER 50+MEN PO) Take 1 tablet by mouth daily.    . ondansetron (ZOFRAN ODT) 4 MG disintegrating tablet Take 1 tablet (4 mg total) by mouth every 8 (eight) hours as needed for nausea or vomiting. This is a 90 day supply 40 tablet 3  . pantoprazole (PROTONIX) 20 MG tablet Take 20 mg by mouth daily.    . vitamin C (ASCORBIC ACID) 500 MG tablet Take 500 mg by mouth daily.     No current facility-administered medications for this visit.    Allergies  Allergen Reactions  . Avelox [Moxifloxacin Hcl In Nacl]  Hives, Shortness Of Breath and Swelling  . Moxifloxacin Hives  . Skin Adhesives Dermatitis and Rash     Review of Systems: All systems reviewed and negative except where noted in HPI.   Lab Results  Component Value Date   WBC 11.3 (A) 01/08/2016   HGB 15.3 01/08/2016   HCT 42.9 (A) 01/08/2016   MCV 91.3 01/08/2016   PLT 272 12/05/2013  Lab Results  Component Value Date   CREATININE 1.09 01/17/2016   BUN 18 01/17/2016   NA 139 01/17/2016   K 4.4 01/17/2016   CL 106 01/17/2016   CO2 26 01/17/2016    .l  Physical Exam: BP 118/78   Pulse 93   Ht 6' (1.829 m)   Wt 177 lb (80.3 kg)   BMI 24.01 kg/m  Constitutional: Pleasant,well-developed, Douglas [name] in no acute distress. HEENT: Normocephalic and atraumatic. Conjunctivae are normal. No scleral icterus. Neck supple.  Cardiovascular: Normal rate, regular rhythm.  Pulmonary/chest: Effort normal and breath sounds normal. No wheezing, rales or rhonchi. Abdominal: Soft, nondistended, nontender. Abdominal scars noted with small ventral hernia. There are no masses palpable. Extremities: no edema Lymphadenopathy: No cervical adenopathy noted. Neurological: Alert and oriented to person place and time. Skin: Skin is warm and dry. No rashes noted. Psychiatric: Normal mood and affect. Behavior is normal.   ASSESSMENT AND PLAN: 61 year old Douglas [name] with a history of 2 small adenomas 5 years ago, with a history of hereditary leiomyomatosis with associated metastatic renal cell carcinoma. As outlined above he is in a clinical trial at the [facility], on a chemotherapy regimen which includes Avastin every 12 days. This regimen has stabilized his disease, although he realizes his long-term prognosis remains poor. He is here to discuss surveillance colonoscopy for history of adenomas. He also has questions about whether or not he is a candidate for Cologuard.  At this point in time, I think the risks of colonoscopy outweighs the benefits. He would need  to hold his Avastin for likely several weeks prior to a colonoscopy, given the associated bleeding risks associated with this drug. Given this regimen has stabilized his cancer, I would not recommend holding it for a procedure which is unlikely to show colon cancer or a high risk lesion (perhaps he has a few small polyps). He has no symptoms of blood in the stools, new bowel changes, or obvious colon malignancy on CT scan. Further, I would not recommend Cologuard for this patient. He has a history of colon polyps, I would not be surprised if the test was positive and if he has a few small polyps. If he is not a good candidate for a colonoscopy I would avoid stool testing, as it's likely to add to his anxiety. He will discuss these issues with his Oncologist at the [facility] but was in agreement and wants to avoid colonoscopy and with-holding his chemotherapy if possible. All questions answered, he can follow up as needed or if he or his oncologists have any further questions they can contact me.  Mathis Cellar, MD [facility] Pager [phone]  CC: Copland, Gay Filler, MD

## 2017-02-20 NOTE — Patient Instructions (Signed)
If you are age 62 or older, your body mass index should be between 23-30. Your Body mass index is 24.01 kg/m. If this is out of the aforementioned range listed, please consider follow up with your Primary Care Provider.  If you are age 62 or younger, your body mass index should be between 19-25. Your Body mass index is 24.01 kg/m. If this is out of the aformentioned range listed, please consider follow up with your Primary Care Provider.

## 2017-02-25 ENCOUNTER — Encounter: Payer: Self-pay | Admitting: Family Medicine

## 2017-03-05 ENCOUNTER — Encounter: Payer: Self-pay | Admitting: Family Medicine

## 2017-03-05 ENCOUNTER — Ambulatory Visit (INDEPENDENT_AMBULATORY_CARE_PROVIDER_SITE_OTHER): Payer: 59 | Admitting: Family Medicine

## 2017-03-05 VITALS — BP 124/88 | HR 99 | Temp 98.6°F | Ht 73.0 in | Wt 176.0 lb

## 2017-03-05 DIAGNOSIS — F329 Major depressive disorder, single episode, unspecified: Secondary | ICD-10-CM

## 2017-03-05 DIAGNOSIS — C641 Malignant neoplasm of right kidney, except renal pelvis: Secondary | ICD-10-CM

## 2017-03-05 DIAGNOSIS — Z23 Encounter for immunization: Secondary | ICD-10-CM | POA: Diagnosis not present

## 2017-03-05 DIAGNOSIS — R1011 Right upper quadrant pain: Secondary | ICD-10-CM

## 2017-03-05 MED ORDER — HYDROCODONE-ACETAMINOPHEN 7.5-325 MG PO TABS: ORAL_TABLET | ORAL | 0 refills | Status: DC

## 2017-03-05 MED ORDER — BUPROPION HCL ER (XL) 150 MG PO TB24: 300.0000 mg | ORAL_TABLET | Freq: Every day | ORAL | 6 refills | Status: DC

## 2017-03-05 NOTE — Progress Notes (Signed)
[facility] [address], [place], [place] [address] [phone]  Date:  03/05/2017   Name:  Ritik Stavola   DOB:  [birth year]   MRN:  [id number]  PCP:  Darreld Mclean, MD    Chief Complaint: Medication Refill   History of Present Illness:  Tyron Manetta is a 61 y.o. very pleasant male patient who presents with the following:  Here today for a recheck visit and to manage his pain meds  Last seen here in June  Indication for chronic opioid: bone mets from cancer- renal cell Medication and dose: norco 7.5, 1-2 every 4-6h # pills per month: 210 Last UDS date: 05/29/16- low risk Pain contract signed (Y/N): 05/2016 Date narcotic database last reviewed (include red flags): today- none Last filled rx on 9/11  He was just at the [facility] last weekend- all is stable as far as his cancer. His scans have not shown any progression Vonte admits that he has been suffering from some depression due to his illness.  Also, a friend of his just died of throat cancer yesterday.  His activities have been restricted due to back pain- he can't golf or take his boat out like he wants to, he can't mow the lawn He did play golf last week, but he paid for it with severe back pain, shoulder pain for about a week This pain is finally getting better over the last day or so He is seeing Dr. Mardelle Matte next month  He did try taking 2 hydrocodone a couple of times- this took the edge off but nothing is able to really take his pain away.  He is able to keep things under control with the hydrocodone He is on wellbutrin 150 xl daily- wonders if he might be able to increase the dose. He denies any intent of self harm- "I'm doing everything I can to live here doc!"  His wife [name] is here with him today  BP Readings from Last 3 Encounters:  03/05/17 124/88  02/20/17 118/78  11/20/16 112/82     Patient Active Problem List   Diagnosis Date  Noted  . Bone metastases ([place]) 07/26/2016  . CAD (coronary artery disease) 03/25/2014  . Protein-calorie malnutrition, severe ([place]) 10/17/2013  . 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 Renal calculi   . GERD (gastroesophageal reflux disease)   . Allergic rhinitis   . Osteoarthritis   . Encounter for long-term (current) use of other medications   . Hyperlipidemia   . Tobacco use disorder   . COPD (chronic obstructive pulmonary disease) ([place])   . Unspecified vitamin D deficiency   . Hypertension     Past Medical History:  Diagnosis Date  . Allergic rhinitis   . Arthritis    "all over my body" (10/16/2013)  . Chronic back pain    "neck to lower back" (10/16/2013)  . Compression fracture    S/P L-SPINE; pt does not recall this hx on 10/16/2013  . COPD (chronic obstructive pulmonary disease) (HCC)    FVC .66  . Depression    "situational since 09/22/2013 OR"  . Encounter for long-term (current) use of other medications   . GERD (gastroesophageal reflux disease)   . Hemoptysis   . 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 History of blood transfusion 09/2013   "think so; not sure; related to big OR"  . History of surgical fusion joint  DDD C-SPINE  . Hyperlipidemia   . Hypertension   . Leiomyoma OF SKIN   INCREASED RISK RENAL CELL CA  NEEDS CT OF KIDNEYS EVERY 2 YEARS NEXT DUE  02/06/13  . Melanoma in situ ([place])   . Melanoma of back ([place])   . Neuromuscular disorder ([place])   . Osteoarthritis   . Renal calculi   . Renal cell carcinoma ([place])    type 2; papillary  . Tobacco use disorder   . Unspecified vitamin D deficiency     Past Surgical History:  Procedure Laterality Date  . ANTERIOR CERVICAL DECOMP/DISCECTOMY FUSION  1992  . ANTERIOR CRUCIATE LIGAMENT REPAIR Bilateral [phone]  . APPENDECTOMY  09/22/2013  . CHOLECYSTECTOMY  09/22/2013   "had tumors in it"  . HAND LIGAMENT RECONSTRUCTION Right   .  HARVEST BONE GRAFT Right 1992   "hip; for neck fusion"  . INGUINAL HERNIA REPAIR Right 2004  . LIGAMENT REPAIR Right ~ 2001   "little finger"  . LYMPH NODE DISSECTION Right 09/22/2013   "in the area of the kidney; they were clean"  . MELANOMA EXCISION  ~ 2007   "off my back"  . PARTIAL NEPHRECTOMY Right 09/22/2013  . removal of fascia overlying the right psoas Right 09/22/2013  . RENAL ARTERY STENT Right 09/22/2013  . RIGHT COLECTOMY Right 09/22/2013  . ROBOTIC ASSITED PARTIAL NEPHRECTOMY  05/22/2012   Procedure: ROBOTIC ASSITED PARTIAL NEPHRECTOMY;  Surgeon: Alexis Frock, MD;  Location: [facility];  Service: Urology;  Laterality: Right;  Right Robotic Cyst Decortication and Partial Nephrectomy   . SPINE SURGERY      Social History  Substance Use Topics  . Smoking status: Former Smoker    Packs/day: 0.50    Years: 40.00    Types: Cigarettes  . Smokeless tobacco: Never Used     Comment: Pt chewing Nicorette gum currently  . Alcohol use 0.0 oz/week     Comment: 10/16/2013 "2-3 beers daily generally; nothing last month or so"    Family History  Problem Relation Age of Onset  . Adopted: Yes  . Hypertension Father     Allergies  Allergen Reactions  . Avelox [Moxifloxacin Hcl In Nacl] Hives, Shortness Of Breath and Swelling  . Moxifloxacin Hives  . Skin Adhesives Dermatitis and Rash    Medication list has been reviewed and updated.  Current Outpatient Prescriptions on File Prior to Visit  Medication Sig Dispense Refill  . amLODipine (NORVASC) 5 MG tablet TAKE 7.5 MG BY MOUTH DAILY.    Marland Kitchen atorvastatin (LIPITOR) 10 MG tablet TAKE 1 BY MOUTH AT BEDTIME 90 tablet 3  . B Complex Vitamins (B COMPLEX 100 PO) Take 100 mg by mouth daily.    . Bevacizumab (AVASTIN) 100 MG/4ML SOLN Inject into the vein.    Marland Kitchen buPROPion (WELLBUTRIN XL) 150 MG 24 hr tablet Take 150 mg by mouth daily.    . calcium carbonate 1250 MG capsule Take 1,250 mg by mouth 2 (two) times daily with a meal.    . cetirizine  (ZYRTEC) 10 MG tablet Take 10 mg by mouth daily.    . cholecalciferol (VITAMIN D) 1000 UNITS tablet Take 1,000 Units by mouth daily.    Marland Kitchen denosumab (XGEVA) 120 MG/1.7ML SOLN injection Inject 120 mg into the skin every 30 (thirty) days.     Marland Kitchen erlotinib (TARCEVA) 150 MG tablet Take 150 mg by mouth daily. Take on an empty stomach 1 hour before meals or 2 hours after    .  HYDROcodone-acetaminophen (NORCO) 7.5-325 MG tablet 1-2 tablets every 4-6 hours for pain .prescription can be filled on 03/02/17 210 tablet 0  . loperamide (IMODIUM A-D) 2 MG tablet Take 2 mg by mouth 4 (four) times daily as needed for diarrhea or loose stools.    Marland Kitchen LORazepam (ATIVAN) 0.5 MG tablet Take 1 tablet at night for sleep. Not to be taken with pain medication. 30 tablet 5  . losartan (COZAAR) 50 MG tablet TAKE 1 BY MOUTH DAILY BEFORE BREAKFAST 90 tablet 3  . Multiple Vitamins-Minerals (CENTRUM SILVER 50+MEN PO) Take 1 tablet by mouth daily.    . ondansetron (ZOFRAN ODT) 4 MG disintegrating tablet Take 1 tablet (4 mg total) by mouth every 8 (eight) hours as needed for nausea or vomiting. This is a 90 day supply 40 tablet 3  . pantoprazole (PROTONIX) 20 MG tablet Take 20 mg by mouth daily.    . vitamin C (ASCORBIC ACID) 500 MG tablet Take 500 mg by mouth daily.     No current facility-administered medications on file prior to visit.     Review of Systems:  As per HPI- otherwise negative.   Physical Examination: Vitals:   03/05/17 1109  BP: 124/88  Pulse: 99  Temp: 98.6 F (37 C)  SpO2: 97%   Vitals:   03/05/17 1109  Weight: 176 lb (79.8 kg)  Height: 6\' 1"  (1.854 m)   Body mass index is 23.22 kg/m. Ideal Body Weight: Weight in (lb) to have BMI = 25: 189.1  GEN: WDWN, NAD, Non-toxic, A & O x 3, looks well, a bit tearful today HEENT: Atraumatic, Normocephalic. Neck supple. No masses, No LAD. Ears and Nose: No external deformity. CV: RRR, No M/G/R. No JVD. No thrill. No extra heart sounds. PULM: CTA B, no  wheezes, crackles, rhonchi. No retractions. No resp. distress. No accessory muscle use. EXTR: No c/c/e NEURO Normal gait.  PSYCH: Normally interactive. Conversant. Not depressed or anxious appearing.  Calm demeanor.    He may need me to order a cbc, cmp, ua and random urine protein/ creat- the [facility] will fax me this order if we need to order it for him   Assessment and Plan: Reactive depression - Plan: buPROPion (WELLBUTRIN XL) 150 MG 24 hr tablet  Abdominal pain, right upper quadrant - Plan: HYDROcodone-acetaminophen (NORCO) 7.5-325 MG tablet, DISCONTINUED: HYDROcodone-acetaminophen (NORCO) 7.5-325 MG tablet  Renal cell cancer, right ([place]) - Plan: HYDROcodone-acetaminophen (NORCO) 7.5-325 MG tablet, DISCONTINUED: HYDROcodone-acetaminophen (NORCO) 7.5-325 MG tablet  Need for influenza vaccination - Plan: Flu Vaccine QUAD 36+ mos IM  Here today for a followup visit Refilled his pain medication which he uses for cancer with bone mets Flu shot today Increased her wellbutrin to 300 mg He had noted a tingly, strange feeling on his right flank last week.  No rash ever occurred and the feeling is gone now, but we will continue to keep an eye on this  Meds ordered this encounter  Medications  . DISCONTD: HYDROcodone-acetaminophen (NORCO) 7.5-325 MG tablet    Sig: 1-2 tablets every 4-6 hours for pain .prescription can be filled on 03/29/17    Dispense:  210 tablet    Refill:  0  . buPROPion (WELLBUTRIN XL) 150 MG 24 hr tablet    Sig: Take 2 tablets (300 mg total) by mouth daily.    Dispense:  60 tablet    Refill:  6  . HYDROcodone-acetaminophen (NORCO) 7.5-325 MG tablet    Sig: 1-2 tablets every 4-6 hours for pain .  prescription can be filled on 04/28/17    Dispense:  210 tablet    Refill:  0     Signed Lamar Blinks, MD

## 2017-03-05 NOTE — Patient Instructions (Addendum)
We will give you your flu shot today I refilled your pain medications for the next 2 months Please let me know if you need me to order labs for the [facility] next month Fax number here is [phone]  Please ask the [facility] if you can have the Shingrix shingles vaccine series  If your flank area gets worse and you think you need medication for shingles Also, please increase your wellbutrin to 2 of the 150 mg tablets. If this is helpful we can change to the 300 mg later on

## 2017-03-05 NOTE — Progress Notes (Signed)
Pre visit review using our clinic tool,if applicable. No additional management support is needed unless otherwise documented below in the visit note.  

## 2017-03-16 ENCOUNTER — Telehealth: Payer: Self-pay | Admitting: *Deleted

## 2017-03-16 NOTE — Telephone Encounter (Signed)
Received Medical records from [facility]; forwarded to provider/SLS 09/28

## 2017-03-19 ENCOUNTER — Encounter: Payer: Self-pay | Admitting: Family Medicine

## 2017-03-19 ENCOUNTER — Other Ambulatory Visit: Payer: Self-pay | Admitting: Family Medicine

## 2017-03-19 NOTE — Telephone Encounter (Signed)
Requesting: LORazepam (ATIVAN) 0.5 MG tablet Contract: 05/29/16 no control substance contract sign uds sample given, low risk next screen 11/27/16. Last OV: 03/05/17 Last Refill: 09/09/16  Please Advise

## 2017-04-30 ENCOUNTER — Ambulatory Visit: Payer: 59 | Admitting: Family Medicine

## 2017-04-30 ENCOUNTER — Encounter: Payer: Self-pay | Admitting: Family Medicine

## 2017-04-30 VITALS — BP 127/90 | HR 94 | Temp 98.3°F | Ht 73.0 in | Wt 172.8 lb

## 2017-04-30 DIAGNOSIS — I10 Essential (primary) hypertension: Secondary | ICD-10-CM

## 2017-04-30 DIAGNOSIS — G893 Neoplasm related pain (acute) (chronic): Secondary | ICD-10-CM

## 2017-04-30 DIAGNOSIS — C641 Malignant neoplasm of right kidney, except renal pelvis: Secondary | ICD-10-CM

## 2017-04-30 DIAGNOSIS — F439 Reaction to severe stress, unspecified: Secondary | ICD-10-CM | POA: Diagnosis not present

## 2017-04-30 DIAGNOSIS — R1011 Right upper quadrant pain: Secondary | ICD-10-CM

## 2017-04-30 DIAGNOSIS — Z125 Encounter for screening for malignant neoplasm of prostate: Secondary | ICD-10-CM | POA: Diagnosis not present

## 2017-04-30 DIAGNOSIS — E782 Mixed hyperlipidemia: Secondary | ICD-10-CM

## 2017-04-30 LAB — PSA: PSA: 2.13 ng/mL (ref 0.10–4.00)

## 2017-04-30 LAB — LIPID PANEL
Cholesterol: 176 mg/dL (ref 0–200)
HDL: 62.7 mg/dL (ref >39.00)
NONHDL: 113.65
Total CHOL/HDL Ratio: 3
Triglycerides: 311 mg/dL — ABNORMAL HIGH (ref 0.0–149.0)
VLDL: 62.2 mg/dL — ABNORMAL HIGH (ref 0.0–40.0)

## 2017-04-30 LAB — LDL CHOLESTEROL, DIRECT: LDL DIRECT: 64 mg/dL

## 2017-04-30 MED ORDER — LOSARTAN POTASSIUM 50 MG PO TABS: ORAL_TABLET | ORAL | 3 refills | Status: DC

## 2017-04-30 MED ORDER — HYDROCODONE-ACETAMINOPHEN 7.5-325 MG PO TABS: ORAL_TABLET | ORAL | 0 refills | Status: DC

## 2017-04-30 MED ORDER — ATORVASTATIN CALCIUM 10 MG PO TABS: ORAL_TABLET | ORAL | 3 refills | Status: DC

## 2017-04-30 NOTE — Patient Instructions (Addendum)
It was good to see you again today- take care and I will be in touch with your labs asap I refilled your pain meds for the next 3 months  You may want to have your BP meter re-calibrated.  If you need me to increase your losartan to 75 mg we can certainly do so

## 2017-04-30 NOTE — Progress Notes (Addendum)
Douglas [name] at [facility] [address], [place], [place] [address] [phone]  Date:  04/30/2017   Name:  Author Hatlestad   DOB:  1956/04/16   MRN:  [id number]  PCP:  Darreld Mclean, MD    Chief Complaint: Follow-up (Pt here for med f/u visit. )   History of Present Illness:  Douglas [name] is a 61 y.o. very pleasant male patient who presents with the following:  Follow-up visit today History of renal cell cancer with bone mets, [facility]. He recently had a re-staging visit which went well- they did not find any tumor growth, all seems stable. He was given a 25% change of 5 year survival about 5 years ago which he has achieved, so he is overall happy  Also CAD, GERD, hypertension, hyperlipidemia  At our last visit in September we increased his wellbutrin to 300 mg as his mood was down- per my last note:   [facility]- all is stable as far as his cancer. His scans have not shown any progression [name] admits that he has been suffering from some depression due to his illness.  Also, a friend of his just died of throat cancer yesterday.  His activities have been restricted due to back pain- he can't golf or take his boat out like he wants to, he can't mow the lawn He did play golf last week, but he paid for it with severe back pain, shoulder pain for about a week This pain is finally getting better over the last day or so He is seeing Dr. Mardelle Matte next month  He did try taking 2 hydrocodone a couple of times- this took the edge off but nothing is able to really take his pain away.  He is able to keep things under control with the hydrocodone He is on wellbutrin 150 xl daily- wonders if he might be able to increase the dose. He denies any intent of self [name]- "I'm doing everything I can to live here doc!"  His wife Douglas [name] is here with him today  UDS: due today [place]: filled lorazepam on 11/10,  and hydrocodone on 11/10, 10/11, 9/11- nothing unexpected noted  Indication for chronic opioid: bone mets from cancer- renal cell Medication and dose: norco 7.5, 1-2 every 4-6h # pills per month: 210 Last UDS date: 05/29/16- low risk Pain contract signed (Y/N): 05/2016 Date narcotic database last reviewed (include red flags): today- none  [facility] told him that he could have shingrix so he would like to have this done today - however looking back he had zostavax 3 years ago, so we will wait a bit longer (5 years interval recommended)  He notes that his mood is overall pretty good He does struggle some with the bad health luck that both he and his wife have suffered.  It is easy to get down about this sometimes but he tries not to  No fever or chills No CP or SOB appetitie is ok   BP Readings from Last 3 Encounters:  04/30/17 127/90  03/05/17 124/88  02/20/17 118/78   Lipids:    Component Value Date/Time   CHOL 195 05/29/2016 1200   TRIG 356.0 (H) 05/29/2016 1200   HDL 68.90 05/29/2016 1200   LDLDIRECT 65.0 05/29/2016 1200   VLDL 71.2 (H) 05/29/2016 1200   CHOLHDL 3 05/29/2016 1200  could use a lipid panel and PSA today  Colonoscopy in 2013-on a 5 year recall cycle  He notes that his back pain is much better over the last few weeks- he is able to walk more which is great news. He is able to do more.  Dr. Mardelle Matte did a steroid injection for him about a month ago  He did his re-staging last week at [facility].  Everything looked favorable However they have had some concerns about his BP- they want his DBP to be under 90; it has been creeping up lately  He is taking 10 mg of amlodipine and also losartan 50- the [facility] increases his amlodipine to 10 mg from 7.5 mg  He feels like the 300 mg of Wellbutrin is helping him.  His mood is better He talked with the nutritionist at the [facility] about his weight loss- his BMI is still ok although he has lost about 20 lbs over the last nearly 2 years  He is  trying to eat about 300 cal more per day His appetite is not that great because things don't taste great  They did add phosphorus to his regimen at the [facility] due to low phosphorus   Wt Readings from Last 3 Encounters:  04/30/17 172 lb 12.8 oz (78.4 kg)  03/05/17 176 lb (79.8 kg)  02/20/17 177 lb (80.3 kg)   Patient Active Problem List   Diagnosis Date Noted  . Bone metastases ([place]) 07/26/2016  . CAD (coronary artery disease) 03/25/2014  . Protein-calorie malnutrition, severe ([place]) 10/17/2013  . 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 Renal calculi   . GERD (gastroesophageal reflux disease)   . Allergic rhinitis   . Osteoarthritis   . Encounter for long-term (current) use of other medications   . Hyperlipidemia   . Tobacco use disorder   . COPD (chronic obstructive pulmonary disease) ([place])   . Unspecified vitamin D deficiency   . Hypertension     Past Medical History:  Diagnosis Date  . Allergic rhinitis   . Arthritis    "all over my body" (10/16/2013)  . Chronic back pain    "neck to lower back" (10/16/2013)  . Compression fracture    S/P L-SPINE; pt does not recall this hx on 10/16/2013  . COPD (chronic obstructive pulmonary disease) (HCC)    FVC .66  . Depression    "situational since 09/22/2013 OR"  . Encounter for long-term (current) use of other medications   . GERD (gastroesophageal reflux disease)   . Hemoptysis   . 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 History of blood transfusion 09/2013   "think so; not sure; related to big OR"  . History of surgical fusion joint    DDD C-SPINE  . Hyperlipidemia   . Hypertension   . Leiomyoma OF SKIN   INCREASED RISK RENAL CELL CA  NEEDS CT OF KIDNEYS EVERY 2 YEARS NEXT DUE  02/06/13  . Melanoma in situ ([place])   . Melanoma of back ([place])   . Neuromuscular disorder ([place])   . Osteoarthritis   . Renal calculi   . Renal cell carcinoma ([place])    type 2; papillary  . Tobacco use  disorder   . Unspecified vitamin D deficiency     Past Surgical History:  Procedure Laterality Date  . ANTERIOR CERVICAL DECOMP/DISCECTOMY FUSION  1992  . ANTERIOR CRUCIATE LIGAMENT REPAIR Bilateral [phone]  . APPENDECTOMY  09/22/2013  . CHOLECYSTECTOMY  09/22/2013   "had tumors in it"  .  HAND LIGAMENT RECONSTRUCTION Right   . HARVEST BONE GRAFT Right 1992   "hip; for neck fusion"  . INGUINAL HERNIA REPAIR Right 2004  . LIGAMENT REPAIR Right ~ 2001   "little finger"  . LYMPH NODE DISSECTION Right 09/22/2013   "in the area of the kidney; they were clean"  . MELANOMA EXCISION  ~ 2007   "off my back"  . PARTIAL NEPHRECTOMY Right 09/22/2013  . removal of fascia overlying the right psoas Right 09/22/2013  . RENAL ARTERY STENT Right 09/22/2013  . RIGHT COLECTOMY Right 09/22/2013  . SPINE SURGERY      Social History   Tobacco Use  . Smoking status: Former Smoker    Packs/day: 0.50    Years: 40.00    Pack years: 20.00    Types: Cigarettes  . Smokeless tobacco: Never Used  . Tobacco comment: Pt chewing Nicorette gum currently  Substance Use Topics  . Alcohol use: Yes    Alcohol/week: 0.0 oz    Comment: 10/16/2013 "2-3 beers daily generally; nothing last month or so"  . Drug use: No    Family History  Adopted: Yes  Problem Relation Age of Onset  . Hypertension Father     Allergies  Allergen Reactions  . Avelox [Moxifloxacin Hcl In Nacl] Hives, Shortness Of Breath and Swelling  . Moxifloxacin Hives  . Skin Adhesives Dermatitis and Rash    Medication list has been reviewed and updated.  Current Outpatient Medications on File Prior to Visit  Medication Sig Dispense Refill  . amLODipine (NORVASC) 10 MG tablet Take 10 mg daily by mouth.    Marland Kitchen atorvastatin (LIPITOR) 10 MG tablet TAKE 1 BY MOUTH AT BEDTIME 90 tablet 3  . B Complex Vitamins (B COMPLEX 100 PO) Take 100 mg by mouth daily.    . Bevacizumab (AVASTIN) 100 MG/4ML SOLN Inject into the vein.    Marland Kitchen buPROPion  (WELLBUTRIN XL) 150 MG 24 hr tablet Take 2 tablets (300 mg total) by mouth daily. 60 tablet 6  . calcium carbonate 1250 MG capsule Take 1,250 mg by mouth 2 (two) times daily with a meal.    . cetirizine (ZYRTEC) 10 MG tablet Take 10 mg by mouth daily.    . cholecalciferol (VITAMIN D) 1000 UNITS tablet Take 1,000 Units by mouth daily.    Marland Kitchen denosumab (XGEVA) 120 MG/1.7ML SOLN injection Inject 120 mg into the skin every 30 (thirty) days.     Marland Kitchen erlotinib (TARCEVA) 150 MG tablet Take 150 mg by mouth daily. Take on an empty stomach 1 hour before meals or 2 hours after    . HYDROcodone-acetaminophen (NORCO) 7.5-325 MG tablet 1-2 tablets every 4-6 hours for pain .prescription can be filled on 04/28/17 210 tablet 0  . loperamide (IMODIUM A-D) 2 MG tablet Take 2 mg by mouth 4 (four) times daily as needed for diarrhea or loose stools.    Marland Kitchen LORazepam (ATIVAN) 0.5 MG tablet TAKE ONE TABLET BY MOUTH AT BEDTIME IF NEEDED FOR SLEEP,  DO NOT TAKE WITH PAIN MEDS 30 tablet 5  . losartan (COZAAR) 50 MG tablet TAKE 1 BY MOUTH DAILY BEFORE BREAKFAST 90 tablet 3  . Multiple Vitamins-Minerals (CENTRUM SILVER 50+MEN PO) Take 1 tablet by mouth daily.    . ondansetron (ZOFRAN ODT) 4 MG disintegrating tablet Take 1 tablet (4 mg total) by mouth every 8 (eight) hours as needed for nausea or vomiting. This is a 90 day supply 40 tablet 3  . pantoprazole (PROTONIX) 20 MG tablet Take  20 mg by mouth daily.    . phosphorus (K PHOS NEUTRAL) 155-852-130 MG tablet Take 1 mg 2 (two) times daily by mouth.    . vitamin C (ASCORBIC ACID) 500 MG tablet Take 500 mg by mouth daily.     No current facility-administered medications on file prior to visit.     Review of Systems:  As per HPI- otherwise negative.   Physical Examination: Vitals:   04/30/17 1050  BP: 127/90  Pulse: 94  Temp: 98.3 F (36.8 C)  SpO2: 97%   Vitals:   04/30/17 1050  Weight: 172 lb 12.8 oz (78.4 kg)  Height: 6\' 1"  (1.854 m)   Body mass index is 22.8  kg/m. Ideal Body Weight: Weight in (lb) to have BMI = 25: 189.1  GEN: WDWN, NAD, Non-toxic, A & O x 3, normal weight, looks well HEENT: Atraumatic, Normocephalic. Neck supple. No masses, No LAD. Ears and Nose: No external deformity. CV: RRR, No M/G/R. No JVD. No thrill. No extra heart sounds. PULM: CTA B, no wheezes, crackles, rhonchi. No retractions. No resp. distress. No accessory muscle use. ABD: S, NT, ND, +BS. No rebound. No HSM. EXTR: No c/c/e NEURO Normal gait.  PSYCH: Normally interactive. Conversant. Not depressed or anxious appearing.  Calm demeanor.   Assessment and Plan: Renal cell cancer, right ([place]) - Plan: HYDROcodone-acetaminophen (NORCO) 7.5-325 MG tablet, DISCONTINUED: HYDROcodone-acetaminophen (NORCO) 7.5-325 MG tablet, DISCONTINUED: HYDROcodone-acetaminophen (NORCO) 7.5-325 MG tablet  Abdominal pain, right upper quadrant - Plan: HYDROcodone-acetaminophen (NORCO) 7.5-325 MG tablet, DISCONTINUED: HYDROcodone-acetaminophen (NORCO) 7.5-325 MG tablet, DISCONTINUED: HYDROcodone-acetaminophen (NORCO) 7.5-325 MG tablet  Mixed hyperlipidemia - Plan: atorvastatin (LIPITOR) 10 MG tablet, Lipid panel  Essential hypertension - Plan: losartan (COZAAR) 50 MG tablet  Screening for prostate cancer - Plan: PSA  Chronic pain due to neoplasm - Plan: Pain Mgmt, Profile 8 w/Conf, U  Stress  Here today for a follow-up visit History of renal cancer with bone mets- needs refill of pain meds today Filled for 3 months UDS today psa and lipid panel pending today He will have shingrix in a couple of years   Signed Lamar Blinks, MD  Received his labs 11/13- message to pt via mychart  Results for orders placed or performed in visit on 04/30/17  PSA  Result Value Ref Range   PSA 2.13 0.10 - 4.00 ng/mL  Lipid panel  Result Value Ref Range   Cholesterol 176 0 - 200 mg/dL   Triglycerides 311.0 (H) 0.0 - 149.0 mg/dL   HDL 62.70 >39.00 mg/dL   VLDL 62.2 (H) 0.0 - 40.0 mg/dL    Total CHOL/HDL Ratio 3    NonHDL 113.65   LDL cholesterol, direct  Result Value Ref Range   Direct LDL 64.0 mg/dL

## 2017-05-01 ENCOUNTER — Encounter: Payer: Self-pay | Admitting: Family Medicine

## 2017-05-08 LAB — PAIN MGMT, PROFILE 8 W/CONF, U
6 Acetylmorphine: NEGATIVE ng/mL (ref <10)
ALPHAHYDROXYALPRAZOLAM: NEGATIVE ng/mL (ref <25)
ALPHAHYDROXYMIDAZOLAM: NEGATIVE ng/mL (ref <50)
ALPHAHYDROXYTRIAZOLAM: NEGATIVE ng/mL (ref <50)
Alcohol Metabolites: NEGATIVE ng/mL (ref <500)
Amphetamines: NEGATIVE ng/mL (ref <500)
BUPRENORPHINE, URINE: NEGATIVE ng/mL (ref <5)
Benzodiazepines: POSITIVE ng/mL — AB (ref <100)
CREATININE: 219.8 mg/dL
Cocaine Metabolite: NEGATIVE ng/mL (ref <150)
Codeine: NEGATIVE ng/mL (ref <50)
HYDROCODONE: 8294 ng/mL — AB (ref <50)
Hydromorphone: 1017 ng/mL — ABNORMAL HIGH (ref <50)
Hydroxyethylflurazepam: NEGATIVE ng/mL (ref <50)
MARIJUANA METABOLITE: 11 ng/mL — AB (ref <5)
MARIJUANA METABOLITE: POSITIVE ng/mL — AB (ref <20)
MDMA: NEGATIVE ng/mL (ref <500)
MORPHINE: NEGATIVE ng/mL (ref <50)
NORDIAZEPAM: NEGATIVE ng/mL (ref <50)
Norhydrocodone: 12300 ng/mL — ABNORMAL HIGH (ref <50)
OPIATES: POSITIVE ng/mL — AB (ref <100)
Oxazepam: NEGATIVE ng/mL (ref <50)
Oxidant: NEGATIVE ug/mL (ref <200)
Oxycodone: NEGATIVE ng/mL (ref <100)
PH: 6.13 (ref 4.5–9.0)
TEMAZEPAM: NEGATIVE ng/mL (ref <50)

## 2017-05-09 ENCOUNTER — Encounter: Payer: Self-pay | Admitting: Family Medicine

## 2017-05-18 MED ORDER — ICOSAPENT ETHYL 1 G PO CAPS: ORAL_CAPSULE | ORAL | 11 refills | Status: DC

## 2017-05-21 MED ORDER — OMEGA-3-ACID ETHYL ESTERS 1 G PO CAPS: 1.0000 g | ORAL_CAPSULE | Freq: Two times a day (BID) | ORAL | 3 refills | Status: DC

## 2017-05-21 NOTE — Addendum Note (Signed)
Addended by: Lamar Blinks C on: 05/21/2017 10:03 AM   Modules accepted: Orders

## 2017-07-07 ENCOUNTER — Other Ambulatory Visit: Payer: Self-pay | Admitting: Family Medicine

## 2017-07-07 DIAGNOSIS — R110 Nausea: Secondary | ICD-10-CM

## 2017-07-23 ENCOUNTER — Encounter: Payer: Self-pay | Admitting: Family Medicine

## 2017-07-24 MED ORDER — LORAZEPAM 1 MG PO TABS: ORAL_TABLET | ORAL | 2 refills | Status: DC

## 2017-08-05 ENCOUNTER — Encounter: Payer: Self-pay | Admitting: Family Medicine

## 2017-08-08 NOTE — Progress Notes (Signed)
[place] at [facility] [address] [place], [place] [address] [phone]  Date:  08/09/2017   Name:  Douglas [name]   DOB:  [birth year]   MRN:  [id number]  PCP:  Darreld Mclean, MD    Chief Complaint: Follow-up (Pt here for f/u visit w/labs)   History of Present Illness:  Douglas [name] is a 62 y.o. very pleasant male patient who presents with the following:  Recent [facility] oncology note:  Assessment and Plan:  Headed to treat leiomyomatosis and renal cell cancer. Doing well on current regimen. Patient on Avastin and Tarceva. His most recent scan shows stable disease so patient is very encouraged Bone metastases patient on Xgeva High risk medication reviewed potential toxicities of his meds and reviewed his imaging results that he had sent to me. Pain appears to be bothersome to him he takes Norco and he is functional with that but not as well as he would like to be. I offered to refer him to pain management   He is trying to get his strength back and gain weight He has been encouraged to lift lighter weights with higher reps due to risk of injury.   He notes that he often feels cold- would like to have a thyroid test  He is having more issues with his right hip/sciatica into the right foot This flares if he walks too far or stands too long.    He saw Carter Kitten and did some PT this past fall, and had some injections.   His left shoulder is also painful  He is on 1 mg of ativan that helps him to sleep but he still wakes up a lot due to pain  He is on hydrocodone for his chronic pain, he also has used lidoderm patches up to 1.5 patches at a time for pain  Indication for chronic opioid: bone mets from cancer- renal cell Medication and dose: norco 7.5, 1-2 every 4-6h # pills per month: 210- he would like to increase to 240 as he is using 2 pills at a time more frequently recently  Last UDS date: 04/30/17- low risk Pain  contract signed (Y/N): 05/2016 Date narcotic database last reviewed (include red flags): today- none  He would like rx to fill on 3/10, 4/9, 5/7 [facility]  He is taking 6-8 norco a day- will increase to 240 pills per rx  Patient Active Problem List   Diagnosis Date Noted  . Bone metastases ([place]) 07/26/2016  . CAD (coronary artery disease) 03/25/2014  . Protein-calorie malnutrition, severe ([place]) 10/17/2013  . 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 Renal calculi   . GERD (gastroesophageal reflux disease)   . Allergic rhinitis   . Osteoarthritis   . Encounter for long-term (current) use of other medications   . Hyperlipidemia   . Tobacco use disorder   . COPD (chronic obstructive pulmonary disease) ([place])   . Unspecified vitamin D deficiency   . Hypertension     Past Medical History:  Diagnosis Date  . Allergic rhinitis   . Arthritis    "all over my body" (10/16/2013)  . Chronic back pain    "neck to lower back" (10/16/2013)  . Compression fracture    S/P L-SPINE; pt does not recall this hx on 10/16/2013  . COPD (chronic obstructive pulmonary disease) (HCC)    FVC .66  . Depression    "situational since 09/22/2013 OR"  .  Encounter for long-term (current) use of other medications   . GERD (gastroesophageal reflux disease)   . Hemoptysis   . 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 History of blood transfusion 09/2013   "think so; not sure; related to big OR"  . History of surgical fusion joint    DDD C-SPINE  . Hyperlipidemia   . Hypertension   . Leiomyoma OF SKIN   INCREASED RISK RENAL CELL CA  NEEDS CT OF KIDNEYS EVERY 2 YEARS NEXT DUE  02/06/13  . Melanoma in situ ([place])   . Melanoma of back ([place])   . Neuromuscular disorder ([place])   . Osteoarthritis   . Renal calculi   . Renal cell carcinoma ([place])    type 2; papillary  . Tobacco use disorder   . Unspecified vitamin D deficiency     Past Surgical History:   Procedure Laterality Date  . ANTERIOR CERVICAL DECOMP/DISCECTOMY FUSION  1992  . ANTERIOR CRUCIATE LIGAMENT REPAIR Bilateral [phone]  . APPENDECTOMY  09/22/2013  . CHOLECYSTECTOMY  09/22/2013   "had tumors in it"  . HAND LIGAMENT RECONSTRUCTION Right   . HARVEST BONE GRAFT Right 1992   "hip; for neck fusion"  . INGUINAL HERNIA REPAIR Right 2004  . LIGAMENT REPAIR Right ~ 2001   "little finger"  . LYMPH NODE DISSECTION Right 09/22/2013   "in the area of the kidney; they were clean"  . MELANOMA EXCISION  ~ 2007   "off my back"  . PARTIAL NEPHRECTOMY Right 09/22/2013  . removal of fascia overlying the right psoas Right 09/22/2013  . RENAL ARTERY STENT Right 09/22/2013  . RIGHT COLECTOMY Right 09/22/2013  . ROBOTIC ASSITED PARTIAL NEPHRECTOMY  05/22/2012   Procedure: ROBOTIC ASSITED PARTIAL NEPHRECTOMY;  Surgeon: Alexis Frock, MD;  Location: [facility];  Service: Urology;  Laterality: Right;  Right Robotic Cyst Decortication and Partial Nephrectomy   . SPINE SURGERY      Social History   Tobacco Use  . Smoking status: Former Smoker    Packs/day: 0.50    Years: 40.00    Pack years: 20.00    Types: Cigarettes  . Smokeless tobacco: Never Used  . Tobacco comment: Pt chewing Nicorette gum currently  Substance Use Topics  . Alcohol use: Yes    Alcohol/week: 0.0 oz    Comment: 10/16/2013 "2-3 beers daily generally; nothing last month or so"  . Drug use: No    Family History  Adopted: Yes  Problem Relation Age of Onset  . Hypertension Father     Allergies  Allergen Reactions  . Avelox [Moxifloxacin Hcl In Nacl] Hives, Shortness Of Breath and Swelling  . Moxifloxacin Hives  . Skin Adhesives Dermatitis and Rash    Medication list has been reviewed and updated.  Current Outpatient Medications on File Prior to Visit  Medication Sig Dispense Refill  . amLODipine (NORVASC) 10 MG tablet Take 10 mg daily by mouth.    Marland Kitchen atorvastatin (LIPITOR) 10 MG tablet TAKE 1 BY MOUTH  AT BEDTIME 90 tablet 3  . B Complex Vitamins (B COMPLEX 100 PO) Take 100 mg by mouth daily.    . Bevacizumab (AVASTIN) 100 MG/4ML SOLN Inject into the vein.    Marland Kitchen buPROPion (WELLBUTRIN XL) 150 MG 24 hr tablet Take 2 tablets (300 mg total) by mouth daily. 60 tablet 6  . calcium carbonate 1250 MG capsule Take 1,250 mg by mouth 2 (two) times daily with a meal.    .  cetirizine (ZYRTEC) 10 MG tablet Take 10 mg by mouth daily.    . cholecalciferol (VITAMIN D) 1000 UNITS tablet Take 1,000 Units by mouth daily.    Marland Kitchen denosumab (XGEVA) 120 MG/1.7ML SOLN injection Inject 120 mg into the skin every 30 (thirty) days.     Marland Kitchen erlotinib (TARCEVA) 150 MG tablet Take 150 mg by mouth daily. Take on an empty stomach 1 hour before meals or 2 hours after    . loperamide (IMODIUM A-D) 2 MG tablet Take 2 mg by mouth 4 (four) times daily as needed for diarrhea or loose stools.    Marland Kitchen LORazepam (ATIVAN) 1 MG tablet TAKE ONE HALF OR ONE TABLET BY MOUTH AT BEDTIME IF NEEDED FOR SLEEP,  DO NOT TAKE WITH PAIN MEDS 30 tablet 2  . losartan (COZAAR) 50 MG tablet TAKE 1 BY MOUTH DAILY BEFORE BREAKFAST 90 tablet 3  . Multiple Vitamins-Minerals (CENTRUM SILVER 50+MEN PO) Take 1 tablet by mouth daily.    Marland Kitchen omega-3 acid ethyl esters (LOVAZA) 1 g capsule Take 1 capsule (1 g total) by mouth 2 (two) times daily. 180 capsule 3  . ondansetron (ZOFRAN-ODT) 4 MG disintegrating tablet dissolve 1 tablet ON TONGUE every 8 hours if needed for nausea and vomiting 40 tablet 3  . pantoprazole (PROTONIX) 20 MG tablet Take 20 mg by mouth daily.    . phosphorus (K PHOS NEUTRAL) 155-852-130 MG tablet Take 1 mg 2 (two) times daily by mouth.    . vitamin C (ASCORBIC ACID) 500 MG tablet Take 500 mg by mouth daily.     No current facility-administered medications on file prior to visit.     Review of Systems:  As per HPI- otherwise negative.   Physical Examination: Vitals:   08/09/17 1106  BP: 112/80  Pulse: 88  Temp: 98.4 F (36.9 C)  SpO2: 98%    Vitals:   08/09/17 1106  Weight: 173 lb 12.8 oz (78.8 kg)  Height: 6\' 1"  (1.854 m)   Body mass index is 22.93 kg/m. Ideal Body Weight: Weight in (lb) to have BMI = 25: 189.1  GEN: WDWN, NAD, Non-toxic, A & O x 3, normal weight, appears his normal self today HEENT: Atraumatic, Normocephalic. Neck supple. No masses, No LAD. Ears and Nose: No external deformity. CV: RRR, No M/G/R. No JVD. No thrill. No extra heart sounds. PULM: CTA B, no wheezes, crackles, rhonchi. No retractions. No resp. distress. No accessory muscle use. EXTR: No c/c/e NEURO Normal gait.  PSYCH: Normally interactive. Conversant. Not depressed or anxious appearing.  Calm demeanor.  His left shoulder is very dysfunctional- poor ROM, not able to abduct or flex enough to perform ADLs like dressing, bathing  Assessment and Plan: Vitamin D deficiency - Plan: Vitamin D (25 hydroxy)  Cold intolerance - Plan: TSH  Chronic left shoulder pain - Plan: lidocaine (LIDODERM) 5 %  Renal cell cancer, right (HCC) - Plan: HYDROcodone-acetaminophen (NORCO) 7.5-325 MG tablet, DISCONTINUED: HYDROcodone-acetaminophen (NORCO) 7.5-325 MG tablet, DISCONTINUED: HYDROcodone-acetaminophen (NORCO) 7.5-325 MG tablet  Labs pending as above Refilled his hydrocodone, will change to 240 pills per month His last UDS was positive for MJ- pt told me about this, states that he is not a heavy user and mainly smokes to stimulate his appetite Encouraged him to follow-up with D. Landau about his left shoulder.  He likely would benefit from surgery but this is really not an option for him right now  Signed Lamar Blinks, MD  Received labs 2/22- message to pt \  Thyroid is ok Your vitamin D is low however- I am going to rx a higher dose WEEKLY vitamin D for you to use for 12 weeks.  When you finish this you can go back to a daily OTC supplement with 2,000 IU daily  Results for orders placed or performed in visit on 08/09/17  TSH  Result Value Ref  Range   TSH 1.53 0.35 - 4.50 uIU/mL  Vitamin D (25 hydroxy)  Result Value Ref Range   VITD 17.99 (L) 30.00 - 100.00 ng/mL

## 2017-08-09 ENCOUNTER — Telehealth: Payer: Self-pay

## 2017-08-09 ENCOUNTER — Ambulatory Visit: Payer: 59 | Admitting: Family Medicine

## 2017-08-09 VITALS — BP 112/80 | HR 88 | Temp 98.4°F | Ht 73.0 in | Wt 173.8 lb

## 2017-08-09 DIAGNOSIS — M25512 Pain in left shoulder: Secondary | ICD-10-CM | POA: Diagnosis not present

## 2017-08-09 DIAGNOSIS — R6889 Other general symptoms and signs: Secondary | ICD-10-CM

## 2017-08-09 DIAGNOSIS — E559 Vitamin D deficiency, unspecified: Secondary | ICD-10-CM | POA: Diagnosis not present

## 2017-08-09 DIAGNOSIS — G8929 Other chronic pain: Secondary | ICD-10-CM | POA: Diagnosis not present

## 2017-08-09 DIAGNOSIS — C641 Malignant neoplasm of right kidney, except renal pelvis: Secondary | ICD-10-CM | POA: Diagnosis not present

## 2017-08-09 LAB — TSH: TSH: 1.53 u[IU]/mL (ref 0.35–4.50)

## 2017-08-09 LAB — VITAMIN D 25 HYDROXY (VIT D DEFICIENCY, FRACTURES): VITD: 17.99 ng/mL — ABNORMAL LOW (ref 30.00–100.00)

## 2017-08-09 MED ORDER — LIDOCAINE 5 % EX PTCH: 1.0000 | MEDICATED_PATCH | CUTANEOUS | 6 refills | Status: DC

## 2017-08-09 NOTE — Telephone Encounter (Signed)
PA approved. Effective 07/10/2017 through 08/08/2020.

## 2017-08-09 NOTE — Telephone Encounter (Signed)
PA initiated via Covermymeds; KEY: [id number]. Awaiting determination.

## 2017-08-09 NOTE — Patient Instructions (Signed)
Good to see you today I will send in 3 rx for hydrocodone 240 [facility], to fill on the dates you requested  Please give Dr. Mardelle Matte a call and see him about your shoulder and back We will check your vit D and thyroid levels for you today  Take care!

## 2017-08-10 ENCOUNTER — Encounter: Payer: Self-pay | Admitting: Family Medicine

## 2017-08-10 MED ORDER — HYDROCODONE-ACETAMINOPHEN 7.5-325 MG PO TABS: ORAL_TABLET | ORAL | 0 refills | Status: DC

## 2017-08-10 MED ORDER — VITAMIN D (ERGOCALCIFEROL) 1.25 MG (50000 UNIT) PO CAPS: 50000.0000 [IU] | ORAL_CAPSULE | ORAL | 0 refills | Status: DC

## 2017-08-13 ENCOUNTER — Other Ambulatory Visit: Payer: Self-pay | Admitting: Family Medicine

## 2017-08-22 ENCOUNTER — Encounter: Payer: Self-pay | Admitting: Family Medicine

## 2017-08-22 DIAGNOSIS — F329 Major depressive disorder, single episode, unspecified: Secondary | ICD-10-CM

## 2017-08-22 MED ORDER — BUPROPION HCL ER (XL) 150 MG PO TB24: 300.0000 mg | ORAL_TABLET | Freq: Every day | ORAL | 1 refills | Status: DC

## 2017-08-26 ENCOUNTER — Encounter: Payer: Self-pay | Admitting: Family Medicine

## 2017-08-26 DIAGNOSIS — C641 Malignant neoplasm of right kidney, except renal pelvis: Secondary | ICD-10-CM

## 2017-08-27 ENCOUNTER — Encounter: Payer: Self-pay | Admitting: Family Medicine

## 2017-08-27 MED ORDER — HYDROCODONE-ACETAMINOPHEN 7.5-325 MG PO TABS: ORAL_TABLET | ORAL | 0 refills | Status: DC

## 2017-08-27 NOTE — Telephone Encounter (Signed)
He last filled on 2/8 Ok to refill today Send rx to [facility] as per request  Meds ordered this encounter  Medications  . DISCONTD: HYDROcodone-acetaminophen (NORCO) 7.5-325 MG tablet    Sig: 1-2 tablets every 4-6 hours for pain.  To fill on 10/23/17    Dispense:  240 tablet    Refill:  0  . DISCONTD: HYDROcodone-acetaminophen (NORCO) 7.5-325 MG tablet    Sig: 1-2 tablets every 4-6 hours for pain.  To fill on 08/26/17    Dispense:  240 tablet    Refill:  0  . HYDROcodone-acetaminophen (NORCO) 7.5-325 MG tablet    Sig: 1-2 tablets every 4-6 hours for pain.  To fill on 09/25/17    Dispense:  240 tablet    Refill:  0

## 2017-09-18 ENCOUNTER — Encounter: Payer: Self-pay | Admitting: Family Medicine

## 2017-09-18 MED ORDER — LORAZEPAM 1 MG PO TABS: ORAL_TABLET | ORAL | 2 refills | Status: DC

## 2017-09-18 NOTE — Telephone Encounter (Signed)
Again could not get into [facility].  Will refill for pt as his behavior has always been trustworthy

## 2017-09-26 ENCOUNTER — Encounter: Payer: Self-pay | Admitting: Family Medicine

## 2017-09-27 ENCOUNTER — Encounter: Payer: Self-pay | Admitting: Family Medicine

## 2017-10-13 ENCOUNTER — Encounter: Payer: Self-pay | Admitting: Family Medicine

## 2017-10-31 ENCOUNTER — Other Ambulatory Visit: Payer: Self-pay | Admitting: Family Medicine

## 2017-10-31 DIAGNOSIS — E559 Vitamin D deficiency, unspecified: Secondary | ICD-10-CM

## 2017-11-08 ENCOUNTER — Encounter: Payer: Self-pay | Admitting: Family Medicine

## 2017-11-08 DIAGNOSIS — E559 Vitamin D deficiency, unspecified: Secondary | ICD-10-CM

## 2017-11-12 NOTE — Progress Notes (Signed)
[place] at [facility] [address] [place], [place] [address] [phone]  Date:  11/14/2017   Name:  Douglas [name]   DOB:  10-26-1955   MRN:  [id number]  PCP:  Darreld Mclean, MD    Chief Complaint: Hyperlipidemia (Medication Check); Hypertension; and Vitamin D def.   History of Present Illness:  Douglas [name] is a 62 y.o. very pleasant male patient who presents with the following:  Following up today History of leiomyomatosis and renal cell cancer with bone mets, managed by the [facility] He has chronic pain from his bone mets Also history of HTN, vit D def, hyperlipidemia, former smoker  He notes that his moods are up and down, often due to his physical limitations and pain  He is on max dose wellbutrin He does like to fish and golf but is not able to do these activities that often due to his issues He and his wife Douglas [name] have a very close and supportive marriage   About 3 weeks ago the [facility] started him on loperamide daily for his chronic diarrhea- however this seemed to trigger some constipation for him recently   Cholesterol looked ok in November Colon cancer screening:  He has seen GI, they decided not to pursue any other screening at this time due to her other disease processes  Lab Results  Component Value Date   PSA 2.13 04/30/2017   PSA 2.31 05/29/2016   PSA 1.10 03/25/2013   UDS done 11/18 Contract is done NCCSR reviewed as below 10/23/2017  2  08/27/2017  Hydrocodone-Acetamin 7.5-325  240 20 Je Cop  [id number]  Wal ([id number])  0 90.00 MME Comm Ins  [place]  10/18/2017  3  09/18/2017  Lorazepam 1 Mg Tablet  30 30 Je Cop  [id number]  [place] ([id number])  1 1.00 LME Comm Ins  [place]  09/25/2017  2  08/27/2017  Hydrocodone-Acetamin 7.5-325  240 30 Je Cop  [id number]  Wal (0632)  0 60.00 MME Comm Ins  [place]  09/18/2017  3  09/18/2017  Lorazepam 1 Mg Tablet  30 30 Je Cop  [id number]  [place] (5429)  0 1.00 LME Comm Ins  [place]  08/26/2017  2  08/26/2017   Hydrocodone-Acetamin 7.5-325  240 20 Je Cop  [id number]  Wal (0632)  0 90.00 MME Comm Ins  [place]  08/22/2017  1  07/24/2017  Lorazepam 1 Mg Tablet  30 30 Je Cop  [id number]  Wal [phone])  1 1.00 LME Comm Ins  [place]  07/27/2017  1  04/30/2017  Hydrocodone-Acetamin 7.5-325  210 26 Je Cop  [id number]  Wal (0632)  0 60.58 MME Comm Ins  [place]  07/24/2017  1  07/24/2017  Lorazepam 1 Mg Tablet  30 30 Je Cop  [id number]  Wal [phone])  0 1.00 LME Comm Ins    06/27/2017  1  04/30/2017  Hydrocodone-Acetamin 7.5-325  [place] Cop  [id number]  Wal (0632)  0       Patient Active Problem List   Diagnosis Date Noted  . Bone metastases ([place]) 07/26/2016  . CAD (coronary artery disease) 03/25/2014  . Protein-calorie malnutrition, severe ([place]) 10/17/2013  . 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 Renal calculi   . GERD (gastroesophageal reflux disease)   . Allergic rhinitis   . Osteoarthritis   . Encounter for long-term (current) use of other medications   . Hyperlipidemia   . Tobacco  use disorder   . COPD (chronic obstructive pulmonary disease) ([place])   . Unspecified vitamin D deficiency   . Hypertension     Past Medical History:  Diagnosis Date  . Allergic rhinitis   . Arthritis    "all over my body" (10/16/2013)  . Chronic back pain    "neck to lower back" (10/16/2013)  . Compression fracture    S/P L-SPINE; pt does not recall this hx on 10/16/2013  . COPD (chronic obstructive pulmonary disease) (HCC)    FVC .66  . Depression    "situational since 09/22/2013 OR"  . Encounter for long-term (current) use of other medications   . GERD (gastroesophageal reflux disease)   . Hemoptysis   . 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 History of blood transfusion 09/2013   "think so; not sure; related to big OR"  . History of surgical fusion joint    DDD C-SPINE  . Hyperlipidemia   . Hypertension   . Leiomyoma OF SKIN   INCREASED RISK RENAL CELL CA  NEEDS CT OF KIDNEYS EVERY 2  YEARS NEXT DUE  02/06/13  . Melanoma in situ ([place])   . Melanoma of back ([place])   . Neuromuscular disorder ([place])   . Osteoarthritis   . Renal calculi   . Renal cell carcinoma ([place])    type 2; papillary  . Tobacco use disorder   . Unspecified vitamin D deficiency     Past Surgical History:  Procedure Laterality Date  . ANTERIOR CERVICAL DECOMP/DISCECTOMY FUSION  1992  . ANTERIOR CRUCIATE LIGAMENT REPAIR Bilateral [phone]  . APPENDECTOMY  09/22/2013  . CHOLECYSTECTOMY  09/22/2013   "had tumors in it"  . HAND LIGAMENT RECONSTRUCTION Right   . HARVEST BONE GRAFT Right 1992   "hip; for neck fusion"  . INGUINAL HERNIA REPAIR Right 2004  . LIGAMENT REPAIR Right ~ 2001   "little finger"  . LYMPH NODE DISSECTION Right 09/22/2013   "in the area of the kidney; they were clean"  . MELANOMA EXCISION  ~ 2007   "off my back"  . PARTIAL NEPHRECTOMY Right 09/22/2013  . removal of fascia overlying the right psoas Right 09/22/2013  . RENAL ARTERY STENT Right 09/22/2013  . RIGHT COLECTOMY Right 09/22/2013  . ROBOTIC ASSITED PARTIAL NEPHRECTOMY  05/22/2012   Procedure: ROBOTIC ASSITED PARTIAL NEPHRECTOMY;  Surgeon: Alexis Frock, MD;  Location: [facility];  Service: Urology;  Laterality: Right;  Right Robotic Cyst Decortication and Partial Nephrectomy   . SPINE SURGERY      Social History   Tobacco Use  . Smoking status: Former Smoker    Packs/day: 0.50    Years: 40.00    Pack years: 20.00    Types: Cigarettes  . Smokeless tobacco: Never Used  . Tobacco comment: Pt chewing Nicorette gum currently  Substance Use Topics  . Alcohol use: Yes    Alcohol/week: 0.0 oz    Comment: 10/16/2013 "2-3 beers daily generally; nothing last month or so"  . Drug use: No    Family History  Adopted: Yes  Problem Relation Age of Onset  . Hypertension Father     Allergies  Allergen Reactions  . Avelox [Moxifloxacin Hcl In Nacl] Hives, Shortness Of Breath and Swelling  . Moxifloxacin Hives  .  Skin Adhesives Dermatitis and Rash    Medication list has been reviewed and updated.  Current Outpatient Medications on File Prior to Visit  Medication Sig Dispense Refill  . amLODipine (  NORVASC) 10 MG tablet Take 10 mg daily by mouth.    Marland Kitchen atorvastatin (LIPITOR) 10 MG tablet TAKE 1 BY MOUTH AT BEDTIME 90 tablet 3  . B Complex Vitamins (B COMPLEX 100 PO) Take 100 mg by mouth daily.    . Bevacizumab (AVASTIN) 100 MG/4ML SOLN Inject into the vein.    Marland Kitchen buPROPion (WELLBUTRIN XL) 150 MG 24 hr tablet Take 2 tablets (300 mg total) by mouth daily. 180 tablet 1  . calcium carbonate 1250 MG capsule Take 1,250 mg by mouth 2 (two) times daily with a meal.    . cetirizine (ZYRTEC) 10 MG tablet Take 10 mg by mouth daily.    Marland Kitchen denosumab (XGEVA) 120 MG/1.7ML SOLN injection Inject 120 mg into the skin every 30 (thirty) days.     Marland Kitchen erlotinib (TARCEVA) 150 MG tablet Take 150 mg by mouth daily. Take on an empty stomach 1 hour before meals or 2 hours after    . HYDROcodone-acetaminophen (NORCO) 7.5-325 MG tablet 1-2 tablets every 4-6 hours for pain.  To fill on 09/25/17 240 tablet 0  . lidocaine (LIDODERM) 5 % Place 1 patch onto the skin daily. Remove & Discard patch within 12 hours or as directed by MD.  May wear up to 3 patches at a time 60 patch 6  . loperamide (IMODIUM A-D) 2 MG tablet Take 2 mg by mouth 4 (four) times daily as needed for diarrhea or loose stools.    Marland Kitchen LORazepam (ATIVAN) 1 MG tablet TAKE ONE HALF OR ONE TABLET BY MOUTH AT BEDTIME IF NEEDED FOR SLEEP,  DO NOT TAKE WITH PAIN MEDS 30 tablet 2  . losartan (COZAAR) 50 MG tablet TAKE 1 BY MOUTH DAILY BEFORE BREAKFAST 90 tablet 3  . Multiple Vitamins-Minerals (CENTRUM SILVER 50+MEN PO) Take 1 tablet by mouth daily.    Marland Kitchen omega-3 acid ethyl esters (LOVAZA) 1 g capsule Take 1 capsule (1 g total) by mouth 2 (two) times daily. 180 capsule 3  . ondansetron (ZOFRAN-ODT) 4 MG disintegrating tablet dissolve 1 tablet ON TONGUE every 8 hours if needed for nausea  and vomiting 40 tablet 3  . pantoprazole (PROTONIX) 20 MG tablet Take 20 mg by mouth daily.    . vitamin C (ASCORBIC ACID) 500 MG tablet Take 500 mg by mouth daily.    . Vitamin D, Ergocalciferol, (DRISDOL) 50000 units CAPS capsule TAKE 1 CAPSULE BY MOUTH EVERY 7 DAYS. 12 capsule 2  . phosphorus (K PHOS NEUTRAL) 155-852-130 MG tablet Take 1 mg 2 (two) times daily by mouth.     No current facility-administered medications on file prior to visit.     Review of Systems:  As per HPI- otherwise negative. He is trying to gain weight and has put on a few lbs which is great   Physical Examination: Vitals:   11/14/17 1104  BP: 116/78  Pulse: 78  Resp: 16  SpO2: 98%   Vitals:   11/14/17 1104  Weight: 170 lb (77.1 kg)  Height: 6\' 1"  (1.854 m)   Body mass index is 22.43 kg/m. Ideal Body Weight: Weight in (lb) to have BMI = 25: 189.1  GEN: WDWN, NAD, Non-toxic, A & O x 3, looks well, normal weight but lighter than is baseline for him  HEENT: Atraumatic, Normocephalic. Neck supple. No masses, No LAD. Ears and Nose: No external deformity. CV: RRR, No M/G/R. No JVD. No thrill. No extra heart sounds. PULM: CTA B, no wheezes, crackles, rhonchi. No retractions. No resp. distress.  No accessory muscle use. EXTR: No c/c/e NEURO Normal gait.  PSYCH: Normally interactive. Conversant. Not depressed or anxious appearing.  Calm demeanor.    Assessment and Plan: Mixed hyperlipidemia  Essential hypertension  Reactive depression  Renal cell cancer, right (HCC) - Plan: HYDROcodone-acetaminophen (NORCO) 7.5-325 MG tablet, HYDROcodone-acetaminophen (NORCO) 7.5-325 MG tablet, HYDROcodone-acetaminophen (NORCO) 7.5-325 MG tablet  Chronic pain due to neoplasm - Plan: HYDROcodone-acetaminophen (NORCO) 7.5-325 MG tablet, HYDROcodone-acetaminophen (NORCO) 7.5-325 MG tablet, HYDROcodone-acetaminophen (NORCO) 7.5-325 MG tablet  Vitamin D deficiency - Plan: Cholecalciferol (VITAMIN D3) 50000 units  TABS   Vitamin D is better, but not at normal level Will treat for another 12 weeks with high dose supplement BP is under fine control UDS is UTD Refilled pain meds or 3 months Signed Lamar Blinks, MD

## 2017-11-13 ENCOUNTER — Other Ambulatory Visit (INDEPENDENT_AMBULATORY_CARE_PROVIDER_SITE_OTHER): Payer: 59

## 2017-11-13 DIAGNOSIS — E559 Vitamin D deficiency, unspecified: Secondary | ICD-10-CM

## 2017-11-13 LAB — VITAMIN D 25 HYDROXY (VIT D DEFICIENCY, FRACTURES): VITD: 23.59 ng/mL — AB (ref 30.00–100.00)

## 2017-11-14 ENCOUNTER — Ambulatory Visit: Payer: 59 | Admitting: Family Medicine

## 2017-11-14 ENCOUNTER — Encounter: Payer: Self-pay | Admitting: Family Medicine

## 2017-11-14 VITALS — BP 116/78 | HR 78 | Resp 16 | Ht 73.0 in | Wt 170.0 lb

## 2017-11-14 DIAGNOSIS — C641 Malignant neoplasm of right kidney, except renal pelvis: Secondary | ICD-10-CM | POA: Diagnosis not present

## 2017-11-14 DIAGNOSIS — F329 Major depressive disorder, single episode, unspecified: Secondary | ICD-10-CM | POA: Diagnosis not present

## 2017-11-14 DIAGNOSIS — I10 Essential (primary) hypertension: Secondary | ICD-10-CM | POA: Diagnosis not present

## 2017-11-14 DIAGNOSIS — E782 Mixed hyperlipidemia: Secondary | ICD-10-CM

## 2017-11-14 DIAGNOSIS — E559 Vitamin D deficiency, unspecified: Secondary | ICD-10-CM

## 2017-11-14 DIAGNOSIS — G893 Neoplasm related pain (acute) (chronic): Secondary | ICD-10-CM

## 2017-11-14 MED ORDER — VITAMIN D3 1.25 MG (50000 UT) PO TABS: 1.0000 | ORAL_TABLET | ORAL | 0 refills | Status: DC

## 2017-11-14 MED ORDER — HYDROCODONE-ACETAMINOPHEN 7.5-325 MG PO TABS: ORAL_TABLET | ORAL | 0 refills | Status: DC

## 2017-11-14 NOTE — Patient Instructions (Signed)
Great to see you as always today!  I will fill 3 months of your pain medication for you Please keep me posted about any other needs We will try another 12 weeks of vitamin D- will use D3 this time  Please plan to see me in about 4 months for a recheck

## 2017-11-15 ENCOUNTER — Encounter: Payer: Self-pay | Admitting: Family Medicine

## 2017-11-15 ENCOUNTER — Ambulatory Visit: Payer: 59 | Admitting: Family Medicine

## 2017-11-15 VITALS — BP 112/80 | HR 98 | Resp 16 | Ht 73.0 in | Wt 170.0 lb

## 2017-11-15 DIAGNOSIS — L03032 Cellulitis of left toe: Secondary | ICD-10-CM | POA: Diagnosis not present

## 2017-11-15 DIAGNOSIS — L600 Ingrowing nail: Secondary | ICD-10-CM | POA: Diagnosis not present

## 2017-11-15 MED ORDER — CEPHALEXIN 500 MG PO CAPS: 500.0000 mg | ORAL_CAPSULE | Freq: Three times a day (TID) | ORAL | 0 refills | Status: DC

## 2017-11-15 NOTE — Patient Instructions (Signed)
Good to see you again! I think you have removed the part of the nail that was ingrown.  However, for the next few days please gently run your nail around the edge and make sure it is not growing back under Use warm soaks about twice a day for the next 2-3 days Keflex three times a day for a week Please let me if you have any concerns at all!

## 2017-11-15 NOTE — Progress Notes (Signed)
[place] at [facility] [address], [place], [place] [address] [phone]  Date:  11/15/2017   Name:  Douglas [name]   DOB:  1956-05-11   MRN:  [id number]  PCP:  Darreld Mclean, MD    Chief Complaint: Ingrown Toe Nail (left second toe, pus, tender to touch, noticied this morning, used mupirocin 2% cream)   History of Present Illness:  Douglas [name] is a 62 y.o. very pleasant male patient who presents with the following:  Seen yesterday for a routine visit He is here today with an ingrown LEFT 2nd toe which he just noticed today - he was able to get out and golf which he enjoyed, but then noted that his toe was hurting and red. He was able to express a good bit of pus from the lateral nail fold and used nail scissors to cut a piece of nail which seemed to be under the skin  He was not sure if anything else was needed for his nail He has not fever or chills, feels as good as normal otherwise  Per the [facility] he is able to use keflex, pcns and azithromycin if needed   Patient Active Problem List   Diagnosis Date Noted  . Bone metastases ([place]) 07/26/2016  . CAD (coronary artery disease) 03/25/2014  . Protein-calorie malnutrition, severe ([place]) 10/17/2013  . 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 Renal calculi   . GERD (gastroesophageal reflux disease)   . Allergic rhinitis   . Osteoarthritis   . Encounter for long-term (current) use of other medications   . Hyperlipidemia   . Tobacco use disorder   . COPD (chronic obstructive pulmonary disease) ([place])   . Unspecified vitamin D deficiency   . Hypertension     Past Medical History:  Diagnosis Date  . Allergic rhinitis   . Arthritis    "all over my body" (10/16/2013)  . Chronic back pain    "neck to lower back" (10/16/2013)  . Compression fracture    S/P L-SPINE; pt does not recall this hx on 10/16/2013  . COPD (chronic obstructive  pulmonary disease) (HCC)    FVC .66  . Depression    "situational since 09/22/2013 OR"  . Encounter for long-term (current) use of other medications   . GERD (gastroesophageal reflux disease)   . Hemoptysis   . 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 History of blood transfusion 09/2013   "think so; not sure; related to big OR"  . History of surgical fusion joint    DDD C-SPINE  . Hyperlipidemia   . Hypertension   . Leiomyoma OF SKIN   INCREASED RISK RENAL CELL CA  NEEDS CT OF KIDNEYS EVERY 2 YEARS NEXT DUE  02/06/13  . Melanoma in situ ([place])   . Melanoma of back ([place])   . Neuromuscular disorder ([place])   . Osteoarthritis   . Renal calculi   . Renal cell carcinoma ([place])    type 2; papillary  . Tobacco use disorder   . Unspecified vitamin D deficiency     Past Surgical History:  Procedure Laterality Date  . ANTERIOR CERVICAL DECOMP/DISCECTOMY FUSION  1992  . ANTERIOR CRUCIATE LIGAMENT REPAIR Bilateral [phone]  . APPENDECTOMY  09/22/2013  . CHOLECYSTECTOMY  09/22/2013   "had tumors in it"  . HAND LIGAMENT RECONSTRUCTION Right   . HARVEST BONE GRAFT Right 1992   "hip;  for neck fusion"  . INGUINAL HERNIA REPAIR Right 2004  . LIGAMENT REPAIR Right ~ 2001   "little finger"  . LYMPH NODE DISSECTION Right 09/22/2013   "in the area of the kidney; they were clean"  . MELANOMA EXCISION  ~ 2007   "off my back"  . PARTIAL NEPHRECTOMY Right 09/22/2013  . removal of fascia overlying the right psoas Right 09/22/2013  . RENAL ARTERY STENT Right 09/22/2013  . RIGHT COLECTOMY Right 09/22/2013  . ROBOTIC ASSITED PARTIAL NEPHRECTOMY  05/22/2012   Procedure: ROBOTIC ASSITED PARTIAL NEPHRECTOMY;  Surgeon: Alexis Frock, MD;  Location: [facility];  Service: Urology;  Laterality: Right;  Right Robotic Cyst Decortication and Partial Nephrectomy   . SPINE SURGERY      Social History   Tobacco Use  . Smoking status: Former Smoker    Packs/day: 0.50    Years: 40.00     Pack years: 20.00    Types: Cigarettes  . Smokeless tobacco: Never Used  . Tobacco comment: Pt chewing Nicorette gum currently  Substance Use Topics  . Alcohol use: Yes    Alcohol/week: 0.0 oz    Comment: 10/16/2013 "2-3 beers daily generally; nothing last month or so"  . Drug use: No    Family History  Adopted: Yes  Problem Relation Age of Onset  . Hypertension Father     Allergies  Allergen Reactions  . Avelox [Moxifloxacin Hcl In Nacl] Hives, Shortness Of Breath and Swelling  . Moxifloxacin Hives  . Skin Adhesives Dermatitis and Rash  . Tape Dermatitis and Rash    Medication list has been reviewed and updated.  Current Outpatient Medications on File Prior to Visit  Medication Sig Dispense Refill  . amLODipine (NORVASC) 10 MG tablet Take 10 mg daily by mouth.    Marland Kitchen atorvastatin (LIPITOR) 10 MG tablet TAKE 1 BY MOUTH AT BEDTIME 90 tablet 3  . B Complex Vitamins (B COMPLEX 100 PO) Take 100 mg by mouth daily.    . Bevacizumab (AVASTIN) 100 MG/4ML SOLN Inject into the vein.    Marland Kitchen buPROPion (WELLBUTRIN XL) 150 MG 24 hr tablet Take 2 tablets (300 mg total) by mouth daily. 180 tablet 1  . calcium carbonate 1250 MG capsule Take 1,250 mg by mouth 2 (two) times daily with a meal.    . cetirizine (ZYRTEC) 10 MG tablet Take 10 mg by mouth daily.    . Cholecalciferol (VITAMIN D3) 50000 units TABS Take 1 tablet by mouth once a week. Take one a week for 12 weeks 12 tablet 0  . denosumab (XGEVA) 120 MG/1.7ML SOLN injection Inject 120 mg into the skin every 30 (thirty) days.     Marland Kitchen erlotinib (TARCEVA) 150 MG tablet Take 150 mg by mouth daily. Take on an empty stomach 1 hour before meals or 2 hours after    . HYDROcodone-acetaminophen (NORCO) 7.5-325 MG tablet 1-2 tablets every 4-6 hours for pain. 240 tablet 0  . HYDROcodone-acetaminophen (NORCO) 7.5-325 MG tablet Take 1-2 tablets every 4-6 hours as needed for pain. To fill in 30 days 240 tablet 0  . HYDROcodone-acetaminophen (NORCO) 7.5-325 MG  tablet Take 1-2 tablets every 4-6 hours as needed for pain. To fill in 60 days 240 tablet 0  . lidocaine (LIDODERM) 5 % Place 1 patch onto the skin daily. Remove & Discard patch within 12 hours or as directed by MD.  May wear up to 3 patches at a time 60 patch 6  . loperamide (IMODIUM A-D) 2 MG tablet  Take 2 mg by mouth 4 (four) times daily as needed for diarrhea or loose stools.    Marland Kitchen LORazepam (ATIVAN) 1 MG tablet TAKE ONE HALF OR ONE TABLET BY MOUTH AT BEDTIME IF NEEDED FOR SLEEP,  DO NOT TAKE WITH PAIN MEDS 30 tablet 2  . losartan (COZAAR) 50 MG tablet TAKE 1 BY MOUTH DAILY BEFORE BREAKFAST 90 tablet 3  . Multiple Vitamins-Minerals (CENTRUM SILVER 50+MEN PO) Take 1 tablet by mouth daily.    Marland Kitchen omega-3 acid ethyl esters (LOVAZA) 1 g capsule Take 1 capsule (1 g total) by mouth 2 (two) times daily. 180 capsule 3  . ondansetron (ZOFRAN-ODT) 4 MG disintegrating tablet dissolve 1 tablet ON TONGUE every 8 hours if needed for nausea and vomiting 40 tablet 3  . pantoprazole (PROTONIX) 20 MG tablet Take 20 mg by mouth daily.    . phosphorus (K PHOS NEUTRAL) 155-852-130 MG tablet Take 1 mg 2 (two) times daily by mouth.    . vitamin C (ASCORBIC ACID) 500 MG tablet Take 500 mg by mouth daily.     No current facility-administered medications on file prior to visit.     Review of Systems:  As per HPI- otherwise negative.   Physical Examination: Vitals:   11/15/17 1457  BP: 112/80  Pulse: 98  Resp: 16  SpO2: 98%   Vitals:   11/15/17 1457  Weight: 170 lb (77.1 kg)  Height: 6\' 1"  (1.854 m)   Body mass index is 22.43 kg/m. Ideal Body Weight: Weight in (lb) to have BMI = 25: 189.1   GEN: WDWN, NAD, Non-toxic, Alert & Oriented x 3 HEENT: Atraumatic, Normocephalic.  Ears and Nose: No external deformity. EXTR: No clubbing/cyanosis/edema NEURO: Normal gait.  PSYCH: Normally interactive. Conversant. Not depressed or anxious appearing.  Calm demeanor.  Looks well, no distress Left foot: the 2nd  toe displays minimal inflammation but is tender at the lateral nail fold. It appears that he was able to express all the pus. I am not able to feel any nail material still trapped- it seems that he was able to get this out as well   Assessment and Plan: Ingrown toenail of left foot - Plan: cephALEXin (KEFLEX) 500 MG capsule  Paronychia of toe of left foot  Here today with infected ingrown nail, which he seems to have treated pretty well on his own.  Will cover with keflex as well due to his complex and delicate overall picture Discussed using soaks for a few days and making sure the lateral nail edge is free of the nail He will contact me if any concerns  Meds ordered this encounter  Medications  . cephALEXin (KEFLEX) 500 MG capsule    Sig: Take 1 capsule (500 mg total) by mouth 3 (three) times daily.    Dispense:  21 capsule    Refill:  0    Signed Lamar Blinks, MD

## 2017-12-14 ENCOUNTER — Other Ambulatory Visit: Payer: Self-pay | Admitting: Family Medicine

## 2017-12-14 DIAGNOSIS — R110 Nausea: Secondary | ICD-10-CM

## 2017-12-18 ENCOUNTER — Other Ambulatory Visit: Payer: Self-pay | Admitting: Family Medicine

## 2017-12-19 NOTE — Telephone Encounter (Signed)
Rx sent 

## 2017-12-19 NOTE — Telephone Encounter (Signed)
Dr Larose Kells -- Can you advise in PCP's absence?  Last lorazepam RX: 09/18/17, #30 x 2 refills Last OV: 11/14/17 Next OV: due in 03/17/18 but not yet scheduled UDS: 04/30/17 CSC: 2017 CSR: No discrepancies identified

## 2017-12-24 ENCOUNTER — Telehealth: Payer: Self-pay

## 2017-12-24 DIAGNOSIS — K55069 Acute infarction of intestine, part and extent unspecified: Secondary | ICD-10-CM

## 2017-12-24 NOTE — Telephone Encounter (Signed)
Copied from [place] [id number]. Topic: General - Other >> Dec 24, 2017  4:21 PM Judyann Munson wrote: Reason for CRM:  Dr. Selinda Orion is calling to speak with dr.copland in regards to patient. Best contact number is [phone] please advise

## 2017-12-24 NOTE — Telephone Encounter (Signed)
Called Dr. Milly Jakob (with the [facility]) back- apparently Darrik had an episode of hypotension while he was out fishing a couple of weeks ago and also some associated abd pain.  They wondered if this is due to mesenteric ischemia- he does have evidence of a partial clot- mural thrombus- in his SMA I will refer to vascular surgery now

## 2017-12-25 ENCOUNTER — Other Ambulatory Visit: Payer: Self-pay

## 2017-12-25 ENCOUNTER — Encounter: Payer: Self-pay | Admitting: Family Medicine

## 2017-12-25 DIAGNOSIS — K551 Chronic vascular disorders of intestine: Secondary | ICD-10-CM

## 2017-12-25 DIAGNOSIS — I771 Stricture of artery: Principal | ICD-10-CM

## 2018-01-07 ENCOUNTER — Encounter: Payer: Self-pay | Admitting: Family Medicine

## 2018-01-08 ENCOUNTER — Telehealth: Payer: Self-pay | Admitting: *Deleted

## 2018-01-08 NOTE — Telephone Encounter (Signed)
Received Medical records from [place] with attached Disk; forwarded to provider/SLS

## 2018-01-10 ENCOUNTER — Telehealth: Payer: Self-pay | Admitting: Family Medicine

## 2018-01-10 ENCOUNTER — Encounter: Payer: Self-pay | Admitting: Family Medicine

## 2018-01-10 DIAGNOSIS — C641 Malignant neoplasm of right kidney, except renal pelvis: Secondary | ICD-10-CM

## 2018-01-10 DIAGNOSIS — G893 Neoplasm related pain (acute) (chronic): Secondary | ICD-10-CM

## 2018-01-10 MED ORDER — HYDROCODONE-ACETAMINOPHEN 7.5-325 MG PO TABS: ORAL_TABLET | ORAL | 0 refills | Status: DC

## 2018-01-10 NOTE — Telephone Encounter (Signed)
Copied from [place] [id number]. Topic: Quick Communication - See Telephone Encounter >> Jan 10, 2018 12:32 PM Ivar Drape wrote: CRM for notification. See Telephone encounter for: 01/10/18. Journey w/[facility] at [facility] would like to request a new Hydrocodone prescription for the patient to be sent to a different [facility], [address] [phone], Fax: [phone] because they don't have it in stock.  This medication has been pre approved by Dr. Lorelei Pont to be filled early because the patient is going out of town.

## 2018-01-10 NOTE — Telephone Encounter (Signed)
Called pharmacy[facility] at [address] and cleared rx. Will Erx to them,  Alerted pt as well

## 2018-01-17 ENCOUNTER — Encounter: Payer: Self-pay | Admitting: Family Medicine

## 2018-01-17 ENCOUNTER — Telehealth: Payer: Self-pay | Admitting: Family Medicine

## 2018-01-17 NOTE — Telephone Encounter (Signed)
Called and gave approval,message to pt

## 2018-01-17 NOTE — Telephone Encounter (Signed)
Author phoned pt. to notify him that he has an additional refill left on his lorazepam. Pt. stated we need to call pharmacy at [phone] to approve early refill today prior to leaving for [facility]. Pt. States the rx was filled by Dr. Larose Kells on 7/3, but pt. did not pick it up until 7/4, and [facility] [address] [facility] not let pt. Pick up his refill early. Routed to Dr. Lorelei Pont to advise.

## 2018-01-17 NOTE — Telephone Encounter (Signed)
Copied from [place] [id number]. Topic: General - Other >> Jan 17, 2018 10:49 AM Lennox Solders wrote: Reason for CRM: pt is calling and needs one day early refill on  lorazepam . Pt is going to out of town to [place] and leaving on flight 6 am 01-18-18

## 2018-01-22 ENCOUNTER — Encounter: Payer: 59 | Admitting: Vascular Surgery

## 2018-01-22 ENCOUNTER — Encounter (HOSPITAL_COMMUNITY): Payer: 59

## 2018-02-08 ENCOUNTER — Encounter: Payer: 59 | Admitting: Vascular Surgery

## 2018-02-08 ENCOUNTER — Encounter (HOSPITAL_COMMUNITY): Payer: 59

## 2018-02-08 NOTE — Progress Notes (Signed)
[place] [facility] [address] [place], [place] [address] [phone]  Date:  02/11/2018   Name:  Douglas [name]   DOB:  20-Nov-1955   MRN:  [id number]  PCP:  Douglas Mclean, MD    Chief Complaint: Medication Follow Up (weight loss, cancer research trial)   History of Present Illness:  Douglas [name] is a 62 y.o. very pleasant male patient who presents with the following:  Following up today- history of hereditary leiomyomatosis and renal cell cancer with bone mets, CAD, hyperlipidemia, COPD, HTN  He is a regular visitor at the [facility] where they are following his cancer- they have to go up to [place] every few weeks which is a burden Douglas [name] does suffer from chronic pain due to bone mets and uses narcotics for this  Last seen here in May [place]: pt has discussed with GI and they decided to not pursue screening for him at this time   UDS is UTD from 11/18 [place] reviewed as follows:  01/17/2018  3  12/19/2017  Lorazepam 1 Mg Tablet  30.00 30 Douglas [name]  [id number]  [place] ([id number])  2/1 1.00 LME Comm Ins  [place]  01/10/2018  2  01/10/2018  Hydrocodone-Acetamin 7.5-325  240.00 20 Douglas [name]  [id number]  Wal ([id number])  1/1 90.00 MME Comm Ins  [place]  12/22/2017  2  11/14/2017  Hydrocodone-Acetamin 7.5-325  240.00 20 Douglas [name]  [id number]  Wal ([id number])  1/1 90.00 MME Comm Ins  [place]  12/19/2017  3  12/19/2017  Lorazepam 1 Mg Tablet  30.00 30 Douglas [name]  [id number]  [place] ([id number])  1/1 1.00 LME Comm Ins  [place]  11/17/2017  3  09/18/2017  Lorazepam 1 Mg Tablet  30.00 30 Douglas [name]  [id number]  [place] (5429)  3/2 1.00 LME Comm Ins  [place]  11/16/2017  2  11/14/2017  Hydrocodone-Acetamin 7.5-325  240.00 20 Douglas [name]  [id number]  Wal (0632)  1/1 90.00 MME Comm Ins  [place]  10/23/2017  2  08/27/2017  Hydrocodone-Acetamin 7.5-325  240.00 20 Douglas [name]  [id number]  Wal (0632)  1/1 90.00 MME Comm Ins  [place]  10/18/2017  3  09/18/2017  Lorazepam 1 Mg Tablet  30.00 30 Douglas [name]  [id number]  [place] ([id number])  2/2 1.00 LME Comm Ins  [place]   09/25/2017  2  08/27/2017  Hydrocodone-Acetamin 7.5-325  240.00 30 Douglas [name]  [id number]  Wal (0632)  1/1 60.00 MME Comm Ins  [place]  09/18/2017  3  09/18/2017  Lorazepam 1 Mg Tablet  30.00 30 Douglas [name]  [id number]  [place] (5429)  1/2 1.00 LME Comm Ins  [place]  08/26/2017  2  08/26/2017  Hydrocodone-Acetamin 7.5-325  240.00 20 Douglas [name]  [id number]  Wal (5053)  1/1 90.00 MME Comm Ins  [place]  08/22/2017  1  07/24/2017  Lorazepam 1 Mg Tablet  30.00 30 Douglas [name]  [id number]  Wal [phone])  2/2 1.00 LME Comm Ins  [place]  07/27/2017  1  04/30/2017  Hydrocodone-Acetamin 7.5-325  210.00 26 Douglas [name]  [id number]  Wal (0632)  1/1 60.58 MME Comm Ins    07/24/2017  1  07/24/2017  Lorazepam 1 Mg Tablet  30.00 30 Douglas [name]  [id number]  Wal [phone]  1/2 1.00 LME Comm Ins    06/27/2017  1  04/30/2017  Hydrocodone-Acetamin 7.5-325  210.00 [place]  [id number]  Wal [phone])  1/1  His BP machine may not be quite accurate but he is concerned about his DBP- it has to be under 90 to get his avastin.  He has had his current home BP monitor for over 2.5 years.  His BP looks good here today and as been fine at last several checks as below BP Readings from Last 3 Encounters:  02/11/18 116/78  11/15/17 112/80  11/14/17 116/78   He will likely get a new BP machine for home use   He finished out his high dose weekly Vit D 3- we will check his level today He is using an OTC calcium and lower dose vit D Encouraged him to continue 2000- 3000 iu of vitamin D daily   He is taking lovaza but does not take every day if he is traveling  He did have a banana this am but is ow fasting   He has lost some weight- this is due to several factors, but he is certainly not eating as much as he did in the past  Wt Readings from Last 3 Encounters:  02/11/18 169 lb (76.7 kg)  11/15/17 170 lb (77.1 kg)  11/14/17 170 lb (77.1 kg)   His weight in 2/18 was 192 lbs His taste sense is not as good, and he will get nauseated easily.   His appetite is not like it used to be  He is seeing DDS  later today for a painful tooth.  His cancer treatment has also caused a lot of dental problems for him  Dietician is contacting him to work on weight gain  He often feels anxious and admits to feeling down He is currently on wellbutrin XL 150 BID but does not feel like his depression is controlled.  Discussed changing him to paxil which may also help him gain weight and he would really like to try this  BP Readings from Last 3 Encounters:  02/11/18 116/78  11/15/17 112/80  11/14/17 116/78     Patient Active Problem List   Diagnosis Date Noted  . Bone metastases ([place]) 07/26/2016  . CAD (coronary artery disease) 03/25/2014  . Protein-calorie malnutrition, severe ([place]) 10/17/2013  . 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 Renal calculi   . GERD (gastroesophageal reflux disease)   . Allergic rhinitis   . Osteoarthritis   . Encounter for long-term (current) use of other medications   . Hyperlipidemia   . Tobacco use disorder   . COPD (chronic obstructive pulmonary disease) ([place])   . Unspecified vitamin D deficiency   . Hypertension     Past Medical History:  Diagnosis Date  . Allergic rhinitis   . Arthritis    "all over my body" (10/16/2013)  . Chronic back pain    "neck to lower back" (10/16/2013)  . Compression fracture    S/P L-SPINE; pt does not recall this hx on 10/16/2013  . COPD (chronic obstructive pulmonary disease) (HCC)    FVC .66  . Depression    "situational since 09/22/2013 OR"  . Encounter for long-term (current) use of other medications   . GERD (gastroesophageal reflux disease)   . Hemoptysis   . 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 History of blood transfusion 09/2013   "think so; not sure; related to big OR"  . History of surgical fusion joint    DDD C-SPINE  . Hyperlipidemia   . Hypertension   . Leiomyoma OF SKIN   INCREASED RISK RENAL CELL CA  NEEDS CT OF KIDNEYS EVERY 2 YEARS NEXT DUE  02/06/13  .  Melanoma in situ ([place])   . Melanoma of back ([place])   . Neuromuscular disorder ([place])   . Osteoarthritis   . Renal calculi   . Renal cell carcinoma ([place])    type 2; papillary  . Tobacco use disorder   . Unspecified vitamin D deficiency     Past Surgical History:  Procedure Laterality Date  . ANTERIOR CERVICAL DECOMP/DISCECTOMY FUSION  1992  . ANTERIOR CRUCIATE LIGAMENT REPAIR Bilateral [phone]  . APPENDECTOMY  09/22/2013  . CHOLECYSTECTOMY  09/22/2013   "had tumors in it"  . HAND LIGAMENT RECONSTRUCTION Right   . HARVEST BONE GRAFT Right 1992   "hip; for neck fusion"  . INGUINAL HERNIA REPAIR Right 2004  . LIGAMENT REPAIR Right ~ 2001   "little finger"  . LYMPH NODE DISSECTION Right 09/22/2013   "in the area of the kidney; they were clean"  . MELANOMA EXCISION  ~ 2007   "off my back"  . PARTIAL NEPHRECTOMY Right 09/22/2013  . removal of fascia overlying the right psoas Right 09/22/2013  . RENAL ARTERY STENT Right 09/22/2013  . RIGHT COLECTOMY Right 09/22/2013  . ROBOTIC ASSITED PARTIAL NEPHRECTOMY  05/22/2012   Procedure: ROBOTIC ASSITED PARTIAL NEPHRECTOMY;  Surgeon: Alexis Frock, MD;  Location: [facility];  Service: Urology;  Laterality: Right;  Right Robotic Cyst Decortication and Partial Nephrectomy   . SPINE SURGERY      Social History   Tobacco Use  . Smoking status: Former Smoker    Packs/day: 0.50    Years: 40.00    Pack years: 20.00    Types: Cigarettes  . Smokeless tobacco: Never Used  . Tobacco comment: Pt chewing Nicorette gum currently  Substance Use Topics  . Alcohol use: Yes    Alcohol/week: 0.0 standard drinks    Comment: 10/16/2013 "2-3 beers daily generally; nothing last month or so"  . Drug use: No    Family History  Adopted: Yes  Problem Relation Age of Onset  . Hypertension Father     Allergies  Allergen Reactions  . Avelox [Moxifloxacin Hcl In Nacl] Hives, Shortness Of Breath and Swelling  . Moxifloxacin Hives  . Skin Adhesives  Dermatitis and Rash  . Tape Dermatitis and Rash    Medication list has been reviewed and updated.  Current Outpatient Medications on File Prior to Visit  Medication Sig Dispense Refill  . amLODipine (NORVASC) 10 MG tablet Take 10 mg daily by mouth.    Marland Kitchen atorvastatin (LIPITOR) 10 MG tablet TAKE 1 BY MOUTH AT BEDTIME 90 tablet 3  . B Complex Vitamins (B COMPLEX 100 PO) Take 100 mg by mouth daily.    . Bevacizumab (AVASTIN) 100 MG/4ML SOLN Inject into the vein.    Marland Kitchen buPROPion (WELLBUTRIN XL) 150 MG 24 hr tablet Take 2 tablets (300 mg total) by mouth daily. 180 tablet 1  . cetirizine (ZYRTEC) 10 MG tablet Take 10 mg by mouth daily.    . Cholecalciferol (VITAMIN D3) 50000 units TABS Take 1 tablet by mouth once a week. Take one a week for 12 weeks 12 tablet 0  . denosumab (XGEVA) 120 MG/1.7ML SOLN injection Inject 120 mg into the skin every 30 (thirty) days.     Marland Kitchen erlotinib (TARCEVA) 150 MG tablet Take 150 mg by mouth daily. Take on an empty stomach 1 hour before meals or 2 hours after    . HYDROcodone-acetaminophen (NORCO) 7.5-325 MG  tablet Take 1-2 tablets every 4-6 hours as needed for pain. To fill in 30 days 240 tablet 0  . HYDROcodone-acetaminophen (NORCO) 7.5-325 MG tablet Take 1-2 tablets every 4-6 hours as needed for pain. To fill in 60 days 240 tablet 0  . HYDROcodone-acetaminophen (NORCO) 7.5-325 MG tablet 1-2 tablets every 4-6 hours for pain. 240 tablet 0  . lidocaine (LIDODERM) 5 % Place 1 patch onto the skin daily. Remove & Discard patch within 12 hours or as directed by MD.  May wear up to 3 patches at a time 60 patch 6  . loperamide (IMODIUM A-D) 2 MG tablet Take 2 mg by mouth 4 (four) times daily as needed for diarrhea or loose stools.    Marland Kitchen LORazepam (ATIVAN) 1 MG tablet TAKE ONE HALF OR ONE TABLET BY MOUTH AT BEDTIME IF NEEDED FOR SLEEP, DO NOT TAKE WITH PAIN MEDS 30 tablet 1  . losartan (COZAAR) 50 MG tablet TAKE 1 BY MOUTH DAILY BEFORE BREAKFAST 90 tablet 3  . Multiple  Vitamins-Minerals (CENTRUM SILVER 50+MEN PO) Take 1 tablet by mouth daily.    Marland Kitchen omega-3 acid ethyl esters (LOVAZA) 1 g capsule Take 1 capsule (1 g total) by mouth 2 (two) times daily. 180 capsule 3  . ondansetron (ZOFRAN-ODT) 4 MG disintegrating tablet DISSOLVE 1 TABLET ON TONGUE EVERY 8 HOURS IF NEEDED FOR NAUSEA AND VOMITING 40 tablet 2  . pantoprazole (PROTONIX) 20 MG tablet Take 20 mg by mouth daily.    Marland Kitchen UNABLE TO FIND Med Name: Calcium 600/D-3 300 2 daily    . vitamin C (ASCORBIC ACID) 500 MG tablet Take 500 mg by mouth daily.     No current facility-administered medications on file prior to visit.     Review of Systems:  As per HPI- otherwise negative. No fever or chills No SI- he is just frustrated by his situation but is trying hard to survive    Physical Examination: Vitals:   02/11/18 0940  BP: 116/78  Pulse: 75  Resp: 16  Temp: 98.1 F (36.7 C)  SpO2: 98%   Vitals:   02/11/18 0940  Weight: 169 lb (76.7 kg)  Height: 6\' 1"  (1.854 m)   Body mass index is 22.3 kg/m. Ideal Body Weight: Weight in (lb) to have BMI = 25: 189.1  GEN: WDWN, NAD, Non-toxic, A & O x 3, appears to have a chronic illness but not acutely ill, has lost weight over the last several months  HEENT: Atraumatic, Normocephalic. Neck supple. No masses, No LAD. Ears and Nose: No external deformity. CV: RRR, No M/G/R. No JVD. No thrill. No extra heart sounds. PULM: CTA B, no wheezes, crackles, rhonchi. No retractions. No resp. distress. No accessory muscle use. EXTR: No c/c/e NEURO Normal gait.  PSYCH: Normally interactive. Conversant. Not depressed or anxious appearing.  Calm demeanor.    Assessment and Plan: Mixed hyperlipidemia - Plan: Lipid panel  Vitamin D deficiency - Plan: Vitamin D (25 hydroxy)  Reactive depression  Weight loss  Renal cell cancer, right (HCC)  Chronic pain due to neoplasm  Essential hypertension following up today His BP seems to be fine here- he will consider  getting a new home machine Lipids, vit D pending as above He needs refills of his pain meds-  Sent 3 rx to his local drug store His depression and anxiety are worsening and he is losing weight.  After discussion with pt and his wife [name] who is here as well decided to taper off wellbutrin  and onto paxil.  He will let me know how this works for him   Signed Lamar Blinks, MD   Results for orders placed or performed in visit on 02/11/18  Vitamin D (25 hydroxy)  Result Value Ref Range   VITD 45.27 30.00 - 100.00 ng/mL  Lipid panel  Result Value Ref Range   Cholesterol 164 0 - 200 mg/dL   Triglycerides 209.0 (H) 0.0 - 149.0 mg/dL   HDL 66.50 >39.00 mg/dL   VLDL 41.8 (H) 0.0 - 40.0 mg/dL   Total CHOL/HDL Ratio 2    NonHDL 97.81   LDL cholesterol, direct  Result Value Ref Range   Direct LDL 60.0 mg/dL

## 2018-02-11 ENCOUNTER — Encounter: Payer: Self-pay | Admitting: Family Medicine

## 2018-02-11 ENCOUNTER — Ambulatory Visit: Payer: 59 | Admitting: Family Medicine

## 2018-02-11 VITALS — BP 116/78 | HR 75 | Temp 98.1°F | Resp 16 | Ht 73.0 in | Wt 169.0 lb

## 2018-02-11 DIAGNOSIS — C641 Malignant neoplasm of right kidney, except renal pelvis: Secondary | ICD-10-CM

## 2018-02-11 DIAGNOSIS — G893 Neoplasm related pain (acute) (chronic): Secondary | ICD-10-CM

## 2018-02-11 DIAGNOSIS — R634 Abnormal weight loss: Secondary | ICD-10-CM

## 2018-02-11 DIAGNOSIS — E782 Mixed hyperlipidemia: Secondary | ICD-10-CM

## 2018-02-11 DIAGNOSIS — F329 Major depressive disorder, single episode, unspecified: Secondary | ICD-10-CM | POA: Diagnosis not present

## 2018-02-11 DIAGNOSIS — F5102 Adjustment insomnia: Secondary | ICD-10-CM

## 2018-02-11 DIAGNOSIS — E559 Vitamin D deficiency, unspecified: Secondary | ICD-10-CM | POA: Diagnosis not present

## 2018-02-11 DIAGNOSIS — I10 Essential (primary) hypertension: Secondary | ICD-10-CM

## 2018-02-11 LAB — LIPID PANEL
CHOL/HDL RATIO: 2
CHOLESTEROL: 164 mg/dL (ref 0–200)
HDL: 66.5 mg/dL (ref >39.00)
NONHDL: 97.81
TRIGLYCERIDES: 209 mg/dL — AB (ref 0.0–149.0)
VLDL: 41.8 mg/dL — ABNORMAL HIGH (ref 0.0–40.0)

## 2018-02-11 LAB — VITAMIN D 25 HYDROXY (VIT D DEFICIENCY, FRACTURES): VITD: 45.27 ng/mL (ref 30.00–100.00)

## 2018-02-11 LAB — LDL CHOLESTEROL, DIRECT: LDL DIRECT: 60 mg/dL

## 2018-02-11 MED ORDER — HYDROCODONE-ACETAMINOPHEN 7.5-325 MG PO TABS: ORAL_TABLET | ORAL | 0 refills | Status: DC

## 2018-02-11 NOTE — Patient Instructions (Addendum)
I would recommend that you take 2,000 to 3,000 iu of vitamin D daily to maintain your level  Let's have you taper off your wellbutrin and then start paxil- I am hoping that this may help with your appetite and depression symptoms.   Take one wellbutrin daily for 5 days, then every other day for 5 doses, then we will start you on paxil which I will rx for you.  Watch for any sedation effect from this medication   I will be in touch with your labs and will refill your pain meds as well  Please see me in 2-3 months

## 2018-02-12 MED ORDER — HYDROCODONE-ACETAMINOPHEN 7.5-325 MG PO TABS: ORAL_TABLET | ORAL | 0 refills | Status: DC

## 2018-02-12 MED ORDER — PAROXETINE HCL 20 MG PO TABS: 20.0000 mg | ORAL_TABLET | Freq: Every day | ORAL | 5 refills | Status: DC

## 2018-02-12 MED ORDER — LORAZEPAM 1 MG PO TABS: ORAL_TABLET | ORAL | 2 refills | Status: DC

## 2018-02-25 ENCOUNTER — Ambulatory Visit (INDEPENDENT_AMBULATORY_CARE_PROVIDER_SITE_OTHER): Payer: 59 | Admitting: Family Medicine

## 2018-02-25 DIAGNOSIS — Z23 Encounter for immunization: Secondary | ICD-10-CM | POA: Diagnosis not present

## 2018-02-25 NOTE — Progress Notes (Signed)
Flu shot requested today, ok to give

## 2018-03-01 ENCOUNTER — Encounter: Payer: Self-pay | Admitting: Family Medicine

## 2018-03-01 DIAGNOSIS — G893 Neoplasm related pain (acute) (chronic): Secondary | ICD-10-CM

## 2018-03-02 MED ORDER — DULOXETINE HCL 20 MG PO CPEP: 20.0000 mg | ORAL_CAPSULE | Freq: Every day | ORAL | 6 refills | Status: DC

## 2018-03-11 ENCOUNTER — Ambulatory Visit (INDEPENDENT_AMBULATORY_CARE_PROVIDER_SITE_OTHER): Payer: 59 | Admitting: Surgery

## 2018-03-11 ENCOUNTER — Encounter: Payer: Self-pay | Admitting: Surgery

## 2018-03-11 ENCOUNTER — Ambulatory Visit (HOSPITAL_COMMUNITY)
Admission: RE | Admit: 2018-03-11 | Discharge: 2018-03-11 | Disposition: A | Discharge status: Home / Self Care | Payer: 59 | Source: Ambulatory Visit | Attending: Surgery | Admitting: Surgery

## 2018-03-11 VITALS — BP 136/89 | HR 63 | Temp 98.7°F | Resp 18 | Ht 73.0 in | Wt 169.0 lb

## 2018-03-11 DIAGNOSIS — K551 Chronic vascular disorders of intestine: Secondary | ICD-10-CM

## 2018-03-11 DIAGNOSIS — I771 Stricture of artery: Secondary | ICD-10-CM

## 2018-03-11 NOTE — Progress Notes (Signed)
[facility]  Patient name: Douglas [name] MRN: [id number] DOB: [birth year] Sex: male   REASON FOR VISIT:    Mesenteric stenosis  HISOTRY OF PRESENT ILLNESS:    Douglas [name]. is a 62 y.o. male who is referred today for evaluation of mesenteric ischemia.  The patient has a history of metastatic papillary renal cell carcinoma.  He is currently getting treated at [facility].  She had an episode of abdominal pain.  On imaging studies for his cancer it is noted that he has stenosis of his mesenteric artery and celiac artery.  He denies a history of postprandial abdominal pain.  He does have diarrhea and weight loss, which has been attributed to his chemotherapy and cancer treatment.  The patient is medically managed for hypertension.  He is a non-smoker.   PAST MEDICAL HISTORY:   Past Medical History:  Diagnosis Date  . Allergy   . Arthritis   . Asthma   . Atherothrombotic microembolism of lower extremity ([place])   . BPH (benign prostatic hyperplasia)   . Decreased hearing   . Diverticulosis   . DJD (degenerative joint disease)   . GERD (gastroesophageal reflux disease)   . Glaucoma    surgery 3 months ago   . Hip pain, right   . Hypertension   . Internal hemorrhoids   . Plantar fasciitis    r foot      FAMILY HISTORY:   Family History  Problem Relation Age of Onset  . Hypertension Sister   . Colon cancer Neg Hx   . Colon polyps Neg Hx     SOCIAL HISTORY:   Social History   Tobacco Use  . Smoking status: Never Smoker  . Smokeless tobacco: Never Used  Substance Use Topics  . Alcohol use: No     ALLERGIES:   No Known Allergies   CURRENT MEDICATIONS:   Current Outpatient Medications  Medication Sig Dispense Refill  . albuterol (PROVENTIL HFA;VENTOLIN HFA) 108 (90 Base) MCG/ACT inhaler Inhale 2 puffs into the lungs every 4 (four) hours as needed for wheezing or shortness of breath. 1 Inhaler  5  . allopurinol (ZYLOPRIM) 300 MG tablet Take 300 mg by mouth daily.    Marland Kitchen amLODipine (NORVASC) 10 MG tablet Take 10 mg by mouth daily.      Marland Kitchen aspirin 81 MG tablet Take 81 mg by mouth daily.    . Aspirin-Calcium Carbonate 81-777 MG TABS Take by mouth.    . benzonatate (TESSALON) 200 MG capsule Take 1 capsule (200 mg total) by mouth 3 (three) times daily as needed for cough. 120 capsule 5  . chlorthalidone (HYGROTON) 25 MG tablet Take 25 mg by mouth daily.      . Difluprednate (DUREZOL) 0.05 % EMUL Apply to eye.    . fluticasone (FLONASE) 50 MCG/ACT nasal spray Place 2 sprays into both nostrils daily. 16 g 5  . Fluticasone-Salmeterol (ADVAIR DISKUS) 250-50 MCG/DOSE AEPB Inhale 1 puff into the lungs daily. 60 each 5  . Fluticasone-Salmeterol (ADVAIR DISKUS) 250-50 MCG/DOSE AEPB Inhale 1 puff into the lungs 2 (two) times daily. 60 each 3  . folic acid (FOLVITE) 1 MG tablet Take 1 mg by mouth daily.    Marland Kitchen guaiFENesin-codeine 100-10 MG/5ML syrup Take 5 mLs by mouth every 4 (four) hours as needed for cough. 120 mL 0  . loratadine (CLARITIN) 10 MG tablet Take 1 tablet (10 mg total) by mouth daily. 30 tablet 5  . meloxicam (MOBIC) 15  MG tablet Take 15 mg by mouth 3 times/day as needed-between meals & bedtime.     . methotrexate (RHEUMATREX) 2.5 MG tablet Take 2.5 mg by mouth once a week. Caution:Chemotherapy. Protect from light.    . metoprolol (LOPRESSOR) 50 MG tablet Take 50 mg by mouth daily. With 25mg . Pt takes a total of 75mg     . metoprolol tartrate (LOPRESSOR) 25 MG tablet Take 25 mg by mouth daily. With 50mg . Pt takes a total of 75 mg    . montelukast (SINGULAIR) 10 MG tablet Take 1 tablet (10 mg total) by mouth at bedtime. 30 tablet 5  . oxyCODONE-acetaminophen (PERCOCET) 5-325 MG per tablet Take 1-2 tablets by mouth every 6 (six) hours as needed for severe pain. 10 tablet 0  . pantoprazole (PROTONIX) 40 MG tablet Take 1 tablet (40 mg total) by mouth daily. 30 tablet 5  . ranitidine (ZANTAC) 300  MG capsule Take 1 capsule (300 mg total) by mouth every evening. 30 capsule 5  . spironolactone (ALDACTONE) 25 MG tablet Take 25 mg by mouth daily.      . tamsulosin (FLOMAX) 0.4 MG CAPS capsule Take 0.4 mg by mouth.    . valACYclovir (VALTREX) 1000 MG tablet Take 1,000 mg by mouth daily.     Current Facility-Administered Medications  Medication Dose Route Frequency Provider Last Rate Last Dose  . 0.9 %  sodium chloride infusion  500 mL Intravenous Continuous Milus Banister, MD      . 0.9 %  sodium chloride infusion  500 mL Intravenous Continuous Milus Banister, MD        REVIEW OF SYSTEMS:   [X]  denotes positive finding, [ ]  denotes negative finding Cardiac  Comments:  Chest pain or chest pressure:    Shortness of breath upon exertion:    Short of breath when lying flat:    Irregular heart rhythm:        Vascular    Pain in calf, thigh, or hip brought on by ambulation:    Pain in feet at night that wakes you up from your sleep:     Blood clot in your veins:    Leg swelling:         Pulmonary    Oxygen at home:    Productive cough:     Wheezing:         Neurologic    Sudden weakness in arms or legs:     Sudden numbness in arms or legs:     Sudden onset of difficulty speaking or slurred speech:    Temporary loss of vision in one eye:     Problems with dizziness:         Gastrointestinal    Blood in stool:     Vomited blood:         Genitourinary    Burning when urinating:     Blood in urine:        Psychiatric    Major depression:         Hematologic    Bleeding problems:    Problems with blood clotting too easily:        Skin    Rashes or ulcers:        Constitutional    Fever or chills:      PHYSICAL EXAM:   Vitals:   03/11/18 0900 03/11/18 0903  BP: (!) 147/86 (!) 146/86  Pulse: 72   Resp: 20   SpO2: 96%   Weight: 288  lb (130.6 kg)   Height: 6\' 4"  (1.93 m)     GENERAL: The patient is a well-nourished male, in no acute distress. The vital  signs are documented above. CARDIAC: There is a regular rate and rhythm.  VASCULAR: No carotid bruits.  No abdominal bruits. PULMONARY: Non-labored respirations ABDOMEN: Soft and non-tender with normal pitched bowel sounds.  MUSCULOSKELETAL: There are no major deformities or cyanosis. NEUROLOGIC: No focal weakness or paresthesias are detected. SKIN: There are no ulcers or rashes noted. PSYCHIATRIC: The patient has a normal affect.  STUDIES:   I have reviewed his outside CT scan from 2017 and 2019.  I do not see a significant change in the appearance of the celiac and superior mesenteric artery.  There is luminal narrowing at their origins but I do not think this is clinically significant.  There is mural thrombus present.   Mesenteric duplex shows70-99% stenosis in celiac and SMA MEDICAL ISSUES:   Mesenteric stenosis: I do not think that the patient is suffering from mesenteric ischemia.  He does not endorse postprandial abdominal pain.  He does have weight loss however this can be attributed to his chemotherapy.  No further intervention would be recommended at this time.  I doubt that his episode of severe abdominal pain was related to this.  It was more likely related to his colonic surgery, however the true etiology is unknown.  I told the patient that I would pay attention to the subsequent imaging studies he has with regards to his visceral vessels.  No intervention would be recommended unless he develops severe postprandial abdominal pain.    Annamarie Major, MD [facility] [phone] Pager [phone]

## 2018-03-15 ENCOUNTER — Encounter: Payer: Self-pay | Admitting: Family Medicine

## 2018-03-25 LAB — BASIC METABOLIC PANEL
CREATININE: 1.2 (ref 0.6–1.3)
GLUCOSE: 83
Potassium: 4.2 (ref 3.4–5.3)
Sodium: 134 — AB (ref 137–147)

## 2018-03-25 LAB — CBC AND DIFFERENTIAL
HCT: 45 (ref 41–53)
HEMOGLOBIN: 14.8 (ref 13.5–17.5)
PLATELETS: 245 (ref 150–399)
WBC: 9.8

## 2018-03-25 LAB — HEPATIC FUNCTION PANEL
ALT: 26 (ref 10–40)
AST: 27 (ref 14–40)
Alkaline Phosphatase: 43 (ref 25–125)
Bilirubin, Total: 0.5

## 2018-03-25 LAB — TSH: TSH: 2.21 (ref 0.41–5.90)

## 2018-04-04 ENCOUNTER — Telehealth: Payer: Self-pay | Admitting: *Deleted

## 2018-04-04 NOTE — Telephone Encounter (Signed)
Received Medical records from [facility]; forwarded to provider/SLS 10/17

## 2018-04-05 ENCOUNTER — Encounter: Payer: Self-pay | Admitting: Family Medicine

## 2018-04-09 ENCOUNTER — Encounter: Payer: Self-pay | Admitting: Family Medicine

## 2018-04-09 LAB — CALCIUM: Calcium: 2.3

## 2018-04-09 LAB — ALBUMIN: ALBUMIN: 3.8

## 2018-04-09 LAB — ESTIMATED GFR: EGFR (Non-African Amer.): 66

## 2018-04-09 LAB — BUN: BUN: 20 (ref 4–21)

## 2018-04-09 LAB — CO2, TOTAL: CO2: 24

## 2018-04-09 LAB — CHLORIDE: CHLORIDE: 98

## 2018-04-14 NOTE — Progress Notes (Signed)
[place] [facility] [address] [place], [place] [address] [phone]  Date:  04/15/2018   Name:  Douglas [name]   DOB:  1956-03-02   MRN:  [id number]  PCP:  Darreld Mclean, MD    Chief Complaint: Hypertension (2-3 month follow up, cymbalta-not helping with mood, possibly need to increase?); Hyperlipidemia; and Lab work (psa check, creatnine and protein levels, refills on zofran to [facility])   History of Present Illness:  Douglas [name] is a 62 y.o. very pleasant male patient who presents with the following:  Following up today History of hyperlipidemia, HTN.  [facility] and renal cell cancer with mets to bone.  Chronic pain from mets and from end stage shoulder arthritis He also does see local heme/onc at [facility] periodically; from recent visit there: Assessment and Plan:  Hereditary leiomyomatosis and renal cell cancer has had stable disease on Avastin and Tarceva. He is followed at that [facility] and he is to continue the same. Biggest problem is been weight loss his imaging suggests stable diseas High-risk medications reviewed potential toxicities of his therapies he really has a very good understanding of them. Weight loss having him see the dietitian and Dr. with strategies with eating Pain control fair  I am treating his chronic pain from bone mets with hydrocodone as follows He is taking this every 3-4 hours and it does help at least some He also uses lorazepam for anxiety as below [place]:   03/19/2018  3   02/12/2018  Lorazepam 1 Mg Tablet  30.00 30 Je Cop  [id number]  [place] ([id number])  1/2 1.00 LME Comm Ins  [place]  02/14/2018  3   02/12/2018  Lorazepam 1 Mg Tablet  30.00 30 Je Cop  [id number]  [place] ([id number])  0/2 1.00 LME Comm Ins  [place]  02/12/2018  2   02/12/2018  Hydrocodone-Acetamin 7.5-325  240.00 30 Je Cop  [id number]  Wal [phone])  0/0 60.00 MME Comm Ins  [place]   01/17/2018  3   12/19/2017  Lorazepam 1 Mg Tablet  30.00 30 Donald Siva  [id number]  [place] ([id number])  1/1 1.00 LME Comm Ins  [place]  01/10/2018  2   01/10/2018  Hydrocodone-Acetamin 7.5-325  240.00 20 Je Cop  [id number]  Wal (2191)  0/0 90.00 MME Comm Ins  [place]  12/22/2017  2   11/14/2017  Hydrocodone-Acetamin 7.5-325  240.00 20 Je Cop  [id number]  Wal [phone])  0/0 90.00 MME Comm Ins  [place]  12/19/2017  3   12/19/2017  Lorazepam 1 Mg Tablet  30.00 30 Donald Siva  [id number]  [place] (5429)  0/1 1.00 LME Comm Ins  [place]  11/17/2017  3   09/18/2017  Lorazepam 1 Mg Tablet  30.00 30 Je Cop  [id number]  [place] (5429)  2/2 1.00 LME Comm Ins  [place]  11/16/2017  2   11/14/2017  Hydrocodone-Acetamin 7.5-325  240.00 20 Je Cop  [id number]  Wal [phone])  0/0 90.00 MME Comm Ins  [place]  10/23/2017  2   08/27/2017  Hydrocodone-Acetamin 7.5-325  240.00 20 Je Cop  [id number]  Wal (4888)  0/0 90.00 MME Comm Ins  [place]  10/18/2017  3   09/18/2017  Lorazepam 1 Mg Tablet  30.00 30 Je Cop  [id number]  [place] ([id number])  1/2 1.00 LME Comm Ins  [place]  09/25/2017  2   08/27/2017  Hydrocodone-Acetamin 7.5-325  240.00 30 Je Cop  [id number]  Wal [phone])  0/0 60.00 MME Comm Ins  [place]  09/18/2017  3   09/18/2017  Lorazepam 1 Mg Tablet  30.00 30 Je Cop  [id number]  [place] (2951)  0/2      UDS is due today, but contract is UTD We have decided not to pursue colonoscopy at this time  Also declines PSA at this time   He is working with a dietician to try and keep his weight up He is using a higher calorie ensure and is at least not losing   Wt Readings from Last 3 Encounters:  04/15/18 168 lb (76.2 kg)  03/11/18 169 lb (76.7 kg)  02/11/18 169 lb (76.7 kg)   Weight 2/19- 173 lbs  He does daily weights at home   He just recently went onto cymbalta per [facility]- see mychart note from 03/01/18 He is not feeling well recently- both physically and mentally The cymbalta is helping a lot with his back pain  However his mood is not that great- he can get into a dark place, and may feel panicky, worried about the  future He has a hard time getting up in the am as well  He does not have contact with other persons in his clinical trial, so he is not sure how other people are doing with it.  No SI at all   He is not sure if his appetite is better off Wellbutrin or not   He hates being in this drug trial but he knows that coming off the medications will likely mean death within a year and he is not ready to take this step   He takes lovaza less than rx, because it causes worsening of his diarrhea. However as his lipids are looking good he wishes to continue using it at least some for now.  He takes it a few times a week   He is having a lot of shoulder pain - he needs a replacement but this is not possible while he is on the cancer drugs. He has had steroid shots which help some of the time but not always  He is working with Dr. Mardelle Matte on this issue   His IV blew in his left forearm last week and left a bruise and sore area   Patient Active Problem List   Diagnosis Date Noted  . Bone metastases ([place]) 07/26/2016  . CAD (coronary artery disease) 03/25/2014  . Protein-calorie malnutrition, severe ([place]) 10/17/2013  . 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 Renal calculi   . GERD (gastroesophageal reflux disease)   . Allergic rhinitis   . Osteoarthritis   . Encounter for long-term (current) use of other medications   . Hyperlipidemia   . Tobacco use disorder   . COPD (chronic obstructive pulmonary disease) ([place])   . Unspecified vitamin D deficiency   . Hypertension     Past Medical History:  Diagnosis Date  . Allergic rhinitis   . Arthritis    "all over my body" (10/16/2013)  . Chronic back pain    "neck to lower back" (10/16/2013)  . Compression fracture    S/P L-SPINE; pt does not recall this hx on 10/16/2013  . COPD (chronic obstructive pulmonary disease) (HCC)    FVC .66  . Depression    "situational since 09/22/2013 OR"  . Encounter for long-term (current)  use of other medications   . GERD (  gastroesophageal reflux disease)   . Hemoptysis   . 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 History of blood transfusion 09/2013   "think so; not sure; related to big OR"  . History of surgical fusion joint    DDD C-SPINE  . Hyperlipidemia   . Hypertension   . Leiomyoma OF SKIN   INCREASED RISK RENAL CELL CA  NEEDS CT OF KIDNEYS EVERY 2 YEARS NEXT DUE  02/06/13  . Melanoma in situ ([place])   . Melanoma of back ([place])   . Neuromuscular disorder ([place])   . Osteoarthritis   . Renal calculi   . Renal cell carcinoma ([place])    type 2; papillary  . Tobacco use disorder   . Unspecified vitamin D deficiency     Past Surgical History:  Procedure Laterality Date  . ANTERIOR CERVICAL DECOMP/DISCECTOMY FUSION  1992  . ANTERIOR CRUCIATE LIGAMENT REPAIR Bilateral [phone]  . APPENDECTOMY  09/22/2013  . CHOLECYSTECTOMY  09/22/2013   "had tumors in it"  . HAND LIGAMENT RECONSTRUCTION Right   . HARVEST BONE GRAFT Right 1992   "hip; for neck fusion"  . INGUINAL HERNIA REPAIR Right 2004  . LIGAMENT REPAIR Right ~ 2001   "little finger"  . LYMPH NODE DISSECTION Right 09/22/2013   "in the area of the kidney; they were clean"  . MELANOMA EXCISION  ~ 2007   "off my back"  . PARTIAL NEPHRECTOMY Right 09/22/2013  . removal of fascia overlying the right psoas Right 09/22/2013  . RENAL ARTERY STENT Right 09/22/2013  . RIGHT COLECTOMY Right 09/22/2013  . ROBOTIC ASSITED PARTIAL NEPHRECTOMY  05/22/2012   Procedure: ROBOTIC ASSITED PARTIAL NEPHRECTOMY;  Surgeon: Alexis Frock, MD;  Location: [facility];  Service: Urology;  Laterality: Right;  Right Robotic Cyst Decortication and Partial Nephrectomy   . SPINE SURGERY      Social History   Tobacco Use  . Smoking status: Former Smoker    Packs/day: 0.50    Years: 40.00    Pack years: 20.00    Types: Cigarettes  . Smokeless tobacco: Never Used  . Tobacco comment: Pt chewing Nicorette gum  currently  Substance Use Topics  . Alcohol use: Yes    Alcohol/week: 0.0 standard drinks    Comment: 10/16/2013 "2-3 beers daily generally; nothing last month or so"  . Drug use: No    Family History  Adopted: Yes  Problem Relation Age of Onset  . Hypertension Father     Allergies  Allergen Reactions  . Avelox [Moxifloxacin Hcl In Nacl] Hives, Shortness Of Breath and Swelling  . Moxifloxacin Hives  . Skin Adhesives Dermatitis and Rash  . Tape Dermatitis and Rash    Medication list has been reviewed and updated.  Current Outpatient Medications on File Prior to Visit  Medication Sig Dispense Refill  . amLODipine (NORVASC) 10 MG tablet Take 10 mg daily by mouth.    Marland Kitchen atorvastatin (LIPITOR) 10 MG tablet TAKE 1 BY MOUTH AT BEDTIME 90 tablet 3  . B Complex Vitamins (B COMPLEX 100 PO) Take 100 mg by mouth daily.    . Bevacizumab (AVASTIN) 100 MG/4ML SOLN Inject into the vein.    . cetirizine (ZYRTEC) 10 MG tablet Take 10 mg by mouth daily.    Marland Kitchen denosumab (XGEVA) 120 MG/1.7ML SOLN injection Inject 120 mg into the skin every 30 (thirty) days.     Marland Kitchen erlotinib (TARCEVA) 150 MG tablet Take 150 mg by mouth daily. Take on  an empty stomach 1 hour before meals or 2 hours after    . lidocaine (LIDODERM) 5 % Place 1 patch onto the skin daily. Remove & Discard patch within 12 hours or as directed by MD.  May wear up to 3 patches at a time 60 patch 6  . loperamide (IMODIUM A-D) 2 MG tablet Take 2 mg by mouth 4 (four) times daily as needed for diarrhea or loose stools.    Marland Kitchen LORazepam (ATIVAN) 1 MG tablet TAKE ONE HALF OR ONE TABLET BY MOUTH AT BEDTIME IF NEEDED FOR SLEEP, DO NOT TAKE WITH PAIN MEDS 30 tablet 2  . losartan (COZAAR) 50 MG tablet TAKE 1 BY MOUTH DAILY BEFORE BREAKFAST 90 tablet 3  . Multiple Vitamins-Minerals (CENTRUM SILVER 50+MEN PO) Take 1 tablet by mouth daily.    Marland Kitchen omega-3 acid ethyl esters (LOVAZA) 1 g capsule Take 1 capsule (1 g total) by mouth 2 (two) times daily. 180 capsule 3   . ondansetron (ZOFRAN-ODT) 4 MG disintegrating tablet DISSOLVE 1 TABLET ON TONGUE EVERY 8 HOURS IF NEEDED FOR NAUSEA AND VOMITING 40 tablet 2  . pantoprazole (PROTONIX) 20 MG tablet Take 20 mg by mouth daily.    Marland Kitchen UNABLE TO FIND Med Name: Calcium 600/D-3 300 2 daily    . vitamin C (ASCORBIC ACID) 500 MG tablet Take 500 mg by mouth daily.    Marland Kitchen buPROPion (WELLBUTRIN XL) 150 MG 24 hr tablet Take 2 tablets (300 mg total) by mouth daily. (Patient not taking: Reported on 04/15/2018) 180 tablet 1  . Cholecalciferol (VITAMIN D3) 50000 units TABS Take 1 tablet by mouth once a week. Take one a week for 12 weeks (Patient not taking: Reported on 04/15/2018) 12 tablet 0   No current facility-administered medications on file prior to visit.     Review of Systems:  As per HPI- otherwise negative. No fever or chills Accompanied by his wife [name] who contributes to the history today    Physical Examination: Vitals:   04/15/18 0914  BP: 118/90  Pulse: 84  Resp: 16  Temp: 98.1 F (36.7 C)  SpO2: 98%   Vitals:   04/15/18 0914  Weight: 168 lb (76.2 kg)  Height: 6\' 1"  (1.854 m)   Body mass index is 22.16 kg/m. Ideal Body Weight: Weight in (lb) to have BMI = 25: 189.1  GEN: WDWN, NAD, Non-toxic, A & O x 3, looks well, weight is about stable HEENT: Atraumatic, Normocephalic. Neck supple. No masses, No LAD.  Bilateral TM wnl, oropharynx normal.  PEERL,EOMI.   Ears and Nose: No external deformity. CV: RRR, No M/G/R. No JVD. No thrill. No extra heart sounds. PULM: CTA B, no wheezes, crackles, rhonchi. No retractions. No resp. distress. No accessory muscle use. Bruise on left forearm from recent IV blow- seems to be healing normally ABD: S, NT, ND, +BS. No rebound. No HSM. EXTR: No c/c/e NEURO Normal gait.  PSYCH: Normally interactive. Conversant. Not depressed or anxious appearing.  Calm demeanor.    Assessment and Plan: Chronic pain due to neoplasm - Plan: Pain Mgmt, Profile 8 w/Conf, U,  DULoxetine (CYMBALTA) 20 MG capsule, HYDROcodone-acetaminophen (NORCO) 7.5-325 MG tablet, HYDROcodone-acetaminophen (NORCO) 7.5-325 MG tablet, HYDROcodone-acetaminophen (NORCO) 7.5-325 MG tablet  Renal cell cancer, right (HCC) - Plan: HYDROcodone-acetaminophen (NORCO) 7.5-325 MG tablet, HYDROcodone-acetaminophen (NORCO) 7.5-325 MG tablet, HYDROcodone-acetaminophen (NORCO) 7.5-325 MG tablet  Mixed hyperlipidemia  Essential hypertension  Adjustment insomnia  Screening for prostate cancer  Nausea - Plan: ondansetron (ZOFRAN) 4 MG tablet  Weight loss  Reactive depression - Plan: DULoxetine (CYMBALTA) 20 MG capsule  [name] is really going through a hard time right now His treatments through the [facility] is getting to be harder to tolerate, but stopping treatment will likely bring about his death within a year. For now he is going to continue his treatments He does suffer from chronic pain, and his end stage left shoulder is a big contributor Thankfully his back and bone pain is doing pretty well between cymbalta and hydrocodone He cannot do a shoulder replacement while on his treatment.   He has seen the pain management team at the [place]- they suggested the cymbalta.  He plans to ask them about other options for his pain at next visit and I am glad to implement.  However for now refilled his hydrocodone for him as we typically do Will do UDS today [name] relates that he does use MJ to help with his appetite which is ok with me.  We appreciates his being open and honest about this   We are going to increase his cymbalta to 20 BID- 40 mg a day, and then likely will go up further in a few weeks.  He will keep me posted about his progress here  He is no longer taking paxil or wellbutrin   >40 minutes spent in face to face time with patient, >50% spent in counselling or coordination of care  Signed Lamar Blinks, MD

## 2018-04-15 ENCOUNTER — Encounter: Payer: Self-pay | Admitting: Family Medicine

## 2018-04-15 ENCOUNTER — Ambulatory Visit: Payer: 59 | Admitting: Family Medicine

## 2018-04-15 VITALS — BP 118/90 | HR 84 | Temp 98.1°F | Resp 16 | Ht 73.0 in | Wt 168.0 lb

## 2018-04-15 DIAGNOSIS — R110 Nausea: Secondary | ICD-10-CM

## 2018-04-15 DIAGNOSIS — E782 Mixed hyperlipidemia: Secondary | ICD-10-CM

## 2018-04-15 DIAGNOSIS — F5102 Adjustment insomnia: Secondary | ICD-10-CM

## 2018-04-15 DIAGNOSIS — C641 Malignant neoplasm of right kidney, except renal pelvis: Secondary | ICD-10-CM | POA: Diagnosis not present

## 2018-04-15 DIAGNOSIS — I10 Essential (primary) hypertension: Secondary | ICD-10-CM | POA: Diagnosis not present

## 2018-04-15 DIAGNOSIS — Z125 Encounter for screening for malignant neoplasm of prostate: Secondary | ICD-10-CM

## 2018-04-15 DIAGNOSIS — R634 Abnormal weight loss: Secondary | ICD-10-CM

## 2018-04-15 DIAGNOSIS — G893 Neoplasm related pain (acute) (chronic): Secondary | ICD-10-CM | POA: Diagnosis not present

## 2018-04-15 DIAGNOSIS — F329 Major depressive disorder, single episode, unspecified: Secondary | ICD-10-CM

## 2018-04-15 MED ORDER — ONDANSETRON HCL 4 MG PO TABS: 4.0000 mg | ORAL_TABLET | Freq: Three times a day (TID) | ORAL | 3 refills | Status: DC | PRN

## 2018-04-15 MED ORDER — HYDROCODONE-ACETAMINOPHEN 7.5-325 MG PO TABS: ORAL_TABLET | ORAL | 0 refills | Status: DC

## 2018-04-15 MED ORDER — DULOXETINE HCL 20 MG PO CPEP: 20.0000 mg | ORAL_CAPSULE | Freq: Two times a day (BID) | ORAL | 3 refills | Status: DC

## 2018-04-15 NOTE — Patient Instructions (Addendum)
Let's trying going up to 40 mg of cymbalta, but please let me know how your mood is doing in 2-3 weeks and we can go up further   Please meet with the pain management team at your next visit- if they have some changes to your medications I am glad to implement these for you

## 2018-04-20 ENCOUNTER — Encounter: Payer: Self-pay | Admitting: Family Medicine

## 2018-04-20 DIAGNOSIS — R110 Nausea: Secondary | ICD-10-CM

## 2018-04-20 LAB — PAIN MGMT, PROFILE 8 W/CONF, U
6 Acetylmorphine: NEGATIVE ng/mL (ref <10)
ALPHAHYDROXYALPRAZOLAM: NEGATIVE ng/mL (ref <25)
ALPHAHYDROXYMIDAZOLAM: NEGATIVE ng/mL (ref <50)
Alcohol Metabolites: NEGATIVE ng/mL (ref <500)
Alphahydroxytriazolam: NEGATIVE ng/mL (ref <50)
Aminoclonazepam: NEGATIVE ng/mL (ref <25)
Amphetamines: NEGATIVE ng/mL (ref <500)
Benzodiazepines: POSITIVE ng/mL — AB (ref <100)
Buprenorphine, Urine: NEGATIVE ng/mL (ref <5)
COCAINE METABOLITE: NEGATIVE ng/mL (ref <150)
Codeine: NEGATIVE ng/mL (ref <50)
Creatinine: 155.1 mg/dL
HYDROMORPHONE: 2150 ng/mL — AB (ref <50)
HYDROXYETHYLFLURAZEPAM: NEGATIVE ng/mL (ref <50)
Hydrocodone: 3415 ng/mL — ABNORMAL HIGH (ref <50)
LORAZEPAM: 679 ng/mL — AB (ref <50)
MARIJUANA METABOLITE: POSITIVE ng/mL — AB (ref <20)
MDMA: NEGATIVE ng/mL (ref <500)
Marijuana Metabolite: 24 ng/mL — ABNORMAL HIGH (ref <5)
Morphine: NEGATIVE ng/mL (ref <50)
Nordiazepam: NEGATIVE ng/mL (ref <50)
Norhydrocodone: 3990 ng/mL — ABNORMAL HIGH (ref <50)
OXYCODONE: NEGATIVE ng/mL (ref <100)
Opiates: POSITIVE ng/mL — AB (ref <100)
Oxazepam: NEGATIVE ng/mL (ref <50)
Oxidant: NEGATIVE ug/mL (ref <200)
Temazepam: NEGATIVE ng/mL (ref <50)
pH: 5.64 (ref 4.5–9.0)

## 2018-04-20 MED ORDER — ONDANSETRON 4 MG PO TBDP: ORAL_TABLET | ORAL | 2 refills | Status: DC

## 2018-05-05 ENCOUNTER — Encounter: Payer: Self-pay | Admitting: Family Medicine

## 2018-05-16 ENCOUNTER — Other Ambulatory Visit: Payer: Self-pay | Admitting: Family Medicine

## 2018-05-16 DIAGNOSIS — F329 Major depressive disorder, single episode, unspecified: Secondary | ICD-10-CM

## 2018-05-17 ENCOUNTER — Encounter: Payer: Self-pay | Admitting: Family Medicine

## 2018-05-17 DIAGNOSIS — F329 Major depressive disorder, single episode, unspecified: Secondary | ICD-10-CM

## 2018-05-17 MED ORDER — LORAZEPAM 1 MG PO TABS: ORAL_TABLET | ORAL | 2 refills | Status: DC

## 2018-05-28 ENCOUNTER — Encounter: Payer: Self-pay | Admitting: Family Medicine

## 2018-05-28 DIAGNOSIS — G893 Neoplasm related pain (acute) (chronic): Secondary | ICD-10-CM

## 2018-05-28 DIAGNOSIS — C641 Malignant neoplasm of right kidney, except renal pelvis: Secondary | ICD-10-CM

## 2018-05-28 MED ORDER — HYDROCODONE-ACETAMINOPHEN 7.5-325 MG PO TABS: ORAL_TABLET | ORAL | 0 refills | Status: DC

## 2018-05-28 MED ORDER — AMOXICILLIN 500 MG PO CAPS: 1000.0000 mg | ORAL_CAPSULE | Freq: Two times a day (BID) | ORAL | 0 refills | Status: DC

## 2018-05-28 NOTE — Telephone Encounter (Signed)
Received message from pt about his pain med refill scheduled Friday 06-14-18. That way if there is an issue with the stores stock we have Friday and Saturday to figure something out. Or can you call them to fill early, not sure how to best handle this. I do apologize for the inconvenience it gets frustrating with my RX and [facility] schedule change. I hope this makes sense  He has one to fill on 1/2 but needs it changed to 12/27 This is fine, changed rx and called his pharmacy

## 2018-06-13 ENCOUNTER — Encounter: Payer: Self-pay | Admitting: Family Medicine

## 2018-06-13 ENCOUNTER — Other Ambulatory Visit: Payer: Self-pay

## 2018-06-13 DIAGNOSIS — F329 Major depressive disorder, single episode, unspecified: Secondary | ICD-10-CM

## 2018-06-13 NOTE — Telephone Encounter (Signed)
Please see other encounter as well. Patient called requesting medication to be sent few days early.

## 2018-06-13 NOTE — Telephone Encounter (Signed)
I have called pharmacy and gave the ok

## 2018-06-13 NOTE — Telephone Encounter (Signed)
Copied from [place] [phone]. Topic: General - Other >> Jun 13, 2018 11:02 AM Carolyn Stare wrote:  Pt call to say he leaving town on Monday and is asking for a early refill on the below med. Med is do to be refill on 06/17/18 but he will be leaving before the pharmacy open     LORazepam (ATIVAN) 1 MG tablet   [place]

## 2018-07-02 ENCOUNTER — Telehealth: Payer: Self-pay | Admitting: *Deleted

## 2018-07-02 NOTE — Telephone Encounter (Signed)
Received Medical records from [place]; forwarded to provider/SLS 01/14

## 2018-07-09 NOTE — Progress Notes (Signed)
[facility] at [facility] [address] [place], [place] [address] [phone]  Date:  07/11/2018   Name:  Douglas [name]   DOB:  07/17/1955   MRN:  [id number]  PCP:  Darreld Mclean, MD    Chief Complaint: Medication Refill (follow up, date change on pain medication-going out of town)   History of Present Illness:  Douglas [name] is a 63 y.o. very pleasant male patient who presents with the following:  Here today for a follow-up visit.  Accompanied by his wife [name], who contributes to the history.  History of renal cell cancer/nephrectomy and hereditary leiomyomastosis with mets to bone He also does have CAD, COPD, hyperlipidemia, hypertension.  Also chronic joint pain and arthritis with end-stage shoulder arthritis.  He needs a shoulder replacement, but this has not been feasible given his cancer treatment schedule.  [facility] is mostly [facility], but he also does see hematologist with [facility] periodically.  He is treated with Avastin and Tarceva-he is treated with these medications through a clinical trial Douglas [name] reports that his scans are stable, but he is getting some microalbuminuria.  This is thought due to Avastin. His creat/ protein ratio was most recently not acceptable to get Avastin so he had to miss an infusion.  He is currently collecting a 24-hour urine, and will travel back [facility] next week.  We are hopeful that his parameters will be acceptable for him to get his  infusion There is also some talk of him changing to a different clinical trial if his kidneys do not allow him to continue Avastin He would like to consult with a nephrologist, to help him better understand his kidney condition.  We can certainly refer him  I last saw him in October We are treating his chronic pain with hydrocodone, which he takes every 3-4 hours.  It does help him some.  He also uses lorazepam for anxiety  He did have  a fall on 06/19/2018; tripped over a dog and fell  Thankfully he did not get hurt  Douglas [name] is overdue for colon cancer screening, but he has chosen not to pursue this at this time given his other health problems. Maintaining his weight has been a problem, he is trying to eat enough to keep his weight up-thankfully right now his weight is stable Wt Readings from Last 3 Encounters:  07/11/18 168 lb (76.2 kg)  04/15/18 168 lb (76.2 kg)  03/11/18 169 lb (76.7 kg)    Douglas [name] really enjoys fishing, but he rarely feels well enough to go.  Toxicology: Drug screen was done in October NCCSR:  06/14/2018  2   05/28/2018  Hydrocodone-Acetamin 7.5-325  240.00 20 Je Cop  [id number]  Wal [phone])  0/0 90.00 MME Comm Ins  [place]  06/13/2018  3   05/17/2018  Lorazepam 1 Mg Tablet  30.00 30 Je Cop  [id number]  [place] ([id number])  1/2 1.00 LME Comm Ins  [place]  05/20/2018  2   04/15/2018  Hydrocodone-Acetamin 7.5-325  240.00 20 Je Cop  [id number]  Wal [phone])  0/0 90.00 MME Comm Ins  [place]  05/17/2018  3   05/17/2018  Lorazepam 1 Mg Tablet  30.00 30 Je Cop  [id number]  [place] ([id number])  0/2 1.00 LME Comm Ins    04/21/2018  2   02/12/2018  Hydrocodone-Acetamin 7.5-325  240.00 30 Je Cop  [id number]  Wal ([id number])  0/0 60.00  MME Comm Ins  [place]  04/17/2018  3   02/12/2018  Lorazepam 1 Mg Tablet  30.00 30 Je Cop  [id number]  [place] ([id number])  2/2 1.00 LME Comm Ins  [place]  03/22/2018  2   02/12/2018  Hydrocodone-Acetamin 7.5-325  240.00 60 Je Cop  [id number]  Wal [phone]  0/0 30.00 MME Comm Ins  [place]  03/19/2018  3   02/12/2018  Lorazepam 1 Mg Tablet  30.00 30 Je Cop  [id number]  [place] ([id number])  1/2 1.00 LME Comm Ins  [place]  02/14/2018  3   02/12/2018  Lorazepam 1 Mg Tablet  30.00 30 Je Cop  [id number]  [place] ([id number])  0/2 1.00 LME Comm Ins  [place]  02/12/2018  2   02/12/2018  Hydrocodone-Acetamin 7.5-325  240.00 [place] ([id number])  0/0      Douglas [name] is traveling to [facility] again next Monday. He would like to fill his vicotin Rx on Sunday; it is dated to fill on 2/1 but I can call and move this up  to this weekend.  I called the pharmacy, and they are willing to fill his Vicodin this coming Sunday  In the future we will not pull exact fill dates on his rx; can do every 25 days  Stable let me know when he needs more refills  Patient Active Problem List   Diagnosis Date Noted  . Bone metastases ([place]) 07/26/2016  . CAD (coronary artery disease) 03/25/2014  . Protein-calorie malnutrition, severe ([place]) 10/17/2013  . 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 Renal calculi   . GERD (gastroesophageal reflux disease)   . Allergic rhinitis   . Osteoarthritis   . Encounter for long-term (current) use of other medications   . Hyperlipidemia   . Tobacco use disorder   . COPD (chronic obstructive pulmonary disease) ([place])   . Unspecified vitamin D deficiency   . Hypertension     Past Medical History:  Diagnosis Date  . Allergic rhinitis   . Arthritis    "all over my body" (10/16/2013)  . Chronic back pain    "neck to lower back" (10/16/2013)  . Compression fracture    S/P L-SPINE; pt does not recall this hx on 10/16/2013  . COPD (chronic obstructive pulmonary disease) ([facility])    FVC .66  . Depression    "situational since 09/22/2013 OR"  . Encounter for long-term (current) use of other medications   . GERD (gastroesophageal reflux disease)   . Hemoptysis   . 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 History of blood transfusion 09/2013   "think so; not sure; related to big OR"  . History of surgical fusion joint    DDD C-SPINE  . Hyperlipidemia   . Hypertension   . Leiomyoma OF SKIN   INCREASED RISK RENAL CELL CA  NEEDS CT OF KIDNEYS EVERY 2 YEARS NEXT DUE  02/06/13  . Melanoma in situ ([place])   . Melanoma of back ([place])   . Neuromuscular disorder ([place])   . Osteoarthritis   . Renal calculi   . Renal cell carcinoma ([place])    type 2; papillary  . Tobacco use disorder   . Unspecified vitamin D deficiency     Past Surgical History:   Procedure Laterality Date  . ANTERIOR CERVICAL DECOMP/DISCECTOMY FUSION  1992  . ANTERIOR CRUCIATE LIGAMENT REPAIR Bilateral [phone]  . APPENDECTOMY  09/22/2013  . CHOLECYSTECTOMY  09/22/2013   "  had tumors in it"  . HAND LIGAMENT RECONSTRUCTION Right   . HARVEST BONE GRAFT Right 1992   "hip; for neck fusion"  . INGUINAL HERNIA REPAIR Right 2004  . LIGAMENT REPAIR Right ~ 2001   "little finger"  . LYMPH NODE DISSECTION Right 09/22/2013   "in the area of the kidney; they were clean"  . MELANOMA EXCISION  ~ 2007   "off my back"  . PARTIAL NEPHRECTOMY Right 09/22/2013  . removal of fascia overlying the right psoas Right 09/22/2013  . RENAL ARTERY STENT Right 09/22/2013  . RIGHT COLECTOMY Right 09/22/2013  . ROBOTIC ASSITED PARTIAL NEPHRECTOMY  05/22/2012   Procedure: ROBOTIC ASSITED PARTIAL NEPHRECTOMY;  Surgeon: Alexis Frock, MD;  Location: [facility];  Service: Urology;  Laterality: Right;  Right Robotic Cyst Decortication and Partial Nephrectomy   . SPINE SURGERY      Social History   Tobacco Use  . Smoking status: Former Smoker    Packs/day: 0.50    Years: 40.00    Pack years: 20.00    Types: Cigarettes  . Smokeless tobacco: Never Used  . Tobacco comment: Pt chewing Nicorette gum currently  Substance Use Topics  . Alcohol use: Yes    Alcohol/week: 0.0 standard drinks    Comment: 10/16/2013 "2-3 beers daily generally; nothing last month or so"  . Drug use: No    Family History  Adopted: Yes  Problem Relation Age of Onset  . Hypertension Father     Allergies  Allergen Reactions  . Avelox [Moxifloxacin Hcl In Nacl] Hives, Shortness Of Breath and Swelling  . Moxifloxacin Hives  . Skin Adhesives Dermatitis and Rash  . Tape Dermatitis and Rash    Medication list has been reviewed and updated.  Current Outpatient Medications on File Prior to Visit  Medication Sig Dispense Refill  . amLODipine (NORVASC) 10 MG tablet Take 10 mg daily by mouth.    Marland Kitchen  atorvastatin (LIPITOR) 10 MG tablet TAKE 1 BY MOUTH AT BEDTIME 90 tablet 3  . B Complex Vitamins (B COMPLEX 100 PO) Take 100 mg by mouth daily.    . Bevacizumab (AVASTIN) 100 MG/4ML SOLN Inject into the vein.    . cetirizine (ZYRTEC) 10 MG tablet Take 10 mg by mouth daily.    Marland Kitchen denosumab (XGEVA) 120 MG/1.7ML SOLN injection Inject 120 mg into the skin every 30 (thirty) days.     . DULoxetine (CYMBALTA) 20 MG capsule Take 1 capsule (20 mg total) by mouth 2 (two) times daily. (Patient taking differently: Take 20 mg by mouth daily. ) 60 capsule 3  . erlotinib (TARCEVA) 150 MG tablet Take 150 mg by mouth daily. Take on an empty stomach 1 hour before meals or 2 hours after    . HYDROcodone-acetaminophen (NORCO) 7.5-325 MG tablet Take 1-2 tablets every 4-6 hours as needed for pain. To fill 07/20/2018 240 tablet 0  . HYDROcodone-acetaminophen (NORCO) 7.5-325 MG tablet 1-2 tablets every 4-6 hours for pain. 240 tablet 0  . HYDROcodone-acetaminophen (NORCO) 7.5-325 MG tablet Take 1-2 tablets every 4-6 hours as needed for pain. To fill 06/14/18 240 tablet 0  . lidocaine (LIDODERM) 5 % Place 1 patch onto the skin daily. Remove & Discard patch within 12 hours or as directed by MD.  May wear up to 3 patches at a time 60 patch 6  . loperamide (IMODIUM A-D) 2 MG tablet Take 2 mg by mouth 4 (four) times daily as needed for diarrhea or loose stools.    Marland Kitchen  LORazepam (ATIVAN) 1 MG tablet TAKE ONE HALF OR ONE TABLET BY MOUTH AT BEDTIME IF NEEDED FOR SLEEP, DO NOT TAKE WITH PAIN MEDS 30 tablet 2  . losartan (COZAAR) 50 MG tablet TAKE 1 BY MOUTH DAILY BEFORE BREAKFAST 90 tablet 3  . Multiple Vitamins-Minerals (CENTRUM SILVER 50+MEN PO) Take 1 tablet by mouth daily.    Marland Kitchen omega-3 acid ethyl esters (LOVAZA) 1 g capsule Take 1 capsule (1 g total) by mouth 2 (two) times daily. 180 capsule 3  . ondansetron (ZOFRAN-ODT) 4 MG disintegrating tablet Take 1 every 8 hours as needed for nausea or vomiting 40 tablet 2  . pantoprazole  (PROTONIX) 20 MG tablet Take 20 mg by mouth daily.    Marland Kitchen UNABLE TO FIND Med Name: Calcium 600/D-3 300 2 daily    . vitamin C (ASCORBIC ACID) 500 MG tablet Take 500 mg by mouth daily.     No current facility-administered medications on file prior to visit.     Review of Systems:  As per HPI- otherwise negative.   Physical Examination: Vitals:   07/11/18 1103  BP: 122/88  Pulse: 81  Resp: 16  Temp: 98.1 F (36.7 C)  SpO2: 98%   Vitals:   07/11/18 1103  Weight: 168 lb (76.2 kg)  Height: 6\' 1"  (1.854 m)   Body mass index is 22.16 kg/m. Ideal Body Weight: Weight in (lb) to have BMI = 25: 189.1  GEN: WDWN, NAD, Non-toxic, A & O x 3, appears to have a mild chronic illness, like his normal self HEENT: Atraumatic, Normocephalic. Neck supple. No masses, No LAD. Ears and Nose: No external deformity. CV: RRR, No M/G/R. No JVD. No thrill. No extra heart sounds. PULM: CTA B, no wheezes, crackles, rhonchi. No retractions. No resp. distress. No accessory muscle use. EXTR: No c/c/e NEURO Normal gait.  PSYCH: Normally interactive. Conversant. Not depressed or anxious appearing.  Calm demeanor.    Assessment and Plan: Microalbuminuria - Plan: Ambulatory referral to Nephrology  Chronic pain due to neoplasm  Bone metastases [facility])  Douglas [name] is here for a periodic recheck and to discuss his pain management.  I am prescribing hydrocodone for his metastatic cancer.  He continues to otherwise have cancer treatment at the [facility], although his future therapy is somewhat uncertain right now.  I offered support and encouragement, Douglas [name] will keep me posted on what he finds out next week. Cleared his hydrocodone to fill this weekend, he will let me know when more is needed   Signed Lamar Blinks, MD

## 2018-07-11 ENCOUNTER — Encounter: Payer: Self-pay | Admitting: Family Medicine

## 2018-07-11 ENCOUNTER — Ambulatory Visit (INDEPENDENT_AMBULATORY_CARE_PROVIDER_SITE_OTHER): Payer: Medicare HMO | Admitting: Family Medicine

## 2018-07-11 VITALS — BP 122/88 | HR 81 | Temp 98.1°F | Resp 16 | Ht 73.0 in | Wt 168.0 lb

## 2018-07-11 DIAGNOSIS — G893 Neoplasm related pain (acute) (chronic): Secondary | ICD-10-CM

## 2018-07-11 DIAGNOSIS — C7951 Secondary malignant neoplasm of bone: Secondary | ICD-10-CM | POA: Diagnosis not present

## 2018-07-11 DIAGNOSIS — R809 Proteinuria, unspecified: Secondary | ICD-10-CM | POA: Diagnosis not present

## 2018-07-11 NOTE — Patient Instructions (Signed)
The pharmacy will fill your hydrocodone on Sunday for you  Let me know when you are coming due for another Rx and I will send in for you; we will stop using exact fill dates however! I put in a referral for you to see nephrology for a consultation   Please keep me posted and take care!  We are all thinking of you

## 2018-07-20 ENCOUNTER — Other Ambulatory Visit: Payer: Self-pay | Admitting: Family Medicine

## 2018-07-20 DIAGNOSIS — R110 Nausea: Secondary | ICD-10-CM

## 2018-07-23 ENCOUNTER — Encounter: Payer: Self-pay | Admitting: Family Medicine

## 2018-07-23 DIAGNOSIS — R110 Nausea: Secondary | ICD-10-CM

## 2018-07-23 MED ORDER — ONDANSETRON 4 MG PO TBDP: ORAL_TABLET | ORAL | 2 refills | Status: DC

## 2018-07-24 DIAGNOSIS — C649 Malignant neoplasm of unspecified kidney, except renal pelvis: Secondary | ICD-10-CM | POA: Diagnosis not present

## 2018-07-24 DIAGNOSIS — D481 Neoplasm of uncertain behavior of connective and other soft tissue: Secondary | ICD-10-CM | POA: Diagnosis not present

## 2018-07-24 DIAGNOSIS — I10 Essential (primary) hypertension: Secondary | ICD-10-CM | POA: Diagnosis not present

## 2018-07-24 DIAGNOSIS — M25512 Pain in left shoulder: Secondary | ICD-10-CM | POA: Diagnosis not present

## 2018-07-24 DIAGNOSIS — Z1509 Genetic susceptibility to other malignant neoplasm: Secondary | ICD-10-CM | POA: Diagnosis not present

## 2018-07-24 DIAGNOSIS — Z79899 Other long term (current) drug therapy: Secondary | ICD-10-CM | POA: Diagnosis not present

## 2018-07-24 DIAGNOSIS — C7951 Secondary malignant neoplasm of bone: Secondary | ICD-10-CM | POA: Diagnosis not present

## 2018-07-29 ENCOUNTER — Encounter (HOSPITAL_COMMUNITY): Payer: Self-pay

## 2018-07-29 ENCOUNTER — Emergency Department (HOSPITAL_COMMUNITY)
Admission: EM | Admit: 2018-07-29 | Discharge: 2018-07-29 | Disposition: A | Discharge status: Home / Self Care | Payer: Medicare HMO | Attending: Emergency Medicine | Admitting: Emergency Medicine

## 2018-07-29 ENCOUNTER — Emergency Department (HOSPITAL_COMMUNITY): Payer: Medicare HMO

## 2018-07-29 DIAGNOSIS — M25512 Pain in left shoulder: Secondary | ICD-10-CM

## 2018-07-29 DIAGNOSIS — I10 Essential (primary) hypertension: Secondary | ICD-10-CM | POA: Insufficient documentation

## 2018-07-29 DIAGNOSIS — M19012 Primary osteoarthritis, left shoulder: Secondary | ICD-10-CM | POA: Diagnosis not present

## 2018-07-29 DIAGNOSIS — I2510 Atherosclerotic heart disease of native coronary artery without angina pectoris: Secondary | ICD-10-CM | POA: Diagnosis not present

## 2018-07-29 DIAGNOSIS — Z79899 Other long term (current) drug therapy: Secondary | ICD-10-CM | POA: Diagnosis not present

## 2018-07-29 DIAGNOSIS — M13812 Other specified arthritis, left shoulder: Secondary | ICD-10-CM | POA: Insufficient documentation

## 2018-07-29 DIAGNOSIS — Z87891 Personal history of nicotine dependence: Secondary | ICD-10-CM | POA: Diagnosis not present

## 2018-07-29 DIAGNOSIS — J449 Chronic obstructive pulmonary disease, unspecified: Secondary | ICD-10-CM | POA: Diagnosis not present

## 2018-07-29 DIAGNOSIS — M1990 Unspecified osteoarthritis, unspecified site: Secondary | ICD-10-CM

## 2018-07-29 LAB — BASIC METABOLIC PANEL
Anion gap: 9 (ref 5–15)
BUN: 25 mg/dL — ABNORMAL HIGH (ref 8–23)
CO2: 23 mmol/L (ref 22–32)
Calcium: 9.1 mg/dL (ref 8.9–10.3)
Chloride: 105 mmol/L (ref 98–111)
Creatinine, Ser: 1.01 mg/dL (ref 0.61–1.24)
GFR calc Af Amer: >60 mL/min (ref >60)
GFR calc non Af Amer: >60 mL/min (ref >60)
Glucose, Bld: 98 mg/dL (ref 70–99)
Potassium: 3.8 mmol/L (ref 3.5–5.1)
Sodium: 137 mmol/L (ref 135–145)

## 2018-07-29 LAB — CBC WITH DIFFERENTIAL/PLATELET
Abs Immature Granulocytes: 0.08 10*3/uL — ABNORMAL HIGH (ref 0.00–0.07)
Basophils Absolute: 0 10*3/uL (ref 0.0–0.1)
Basophils Relative: 0 %
Eosinophils Absolute: 0.2 10*3/uL (ref 0.0–0.5)
Eosinophils Relative: 1 %
HCT: 45.7 % (ref 39.0–52.0)
Hemoglobin: 15 g/dL (ref 13.0–17.0)
Immature Granulocytes: 1 %
Lymphocytes Relative: 13 %
Lymphs Abs: 1.7 10*3/uL (ref 0.7–4.0)
MCH: 31.9 pg (ref 26.0–34.0)
MCHC: 32.8 g/dL (ref 30.0–36.0)
MCV: 97.2 fL (ref 80.0–100.0)
Monocytes Absolute: 0.9 10*3/uL (ref 0.1–1.0)
Monocytes Relative: 6 %
Neutro Abs: 10.7 10*3/uL — ABNORMAL HIGH (ref 1.7–7.7)
Neutrophils Relative %: 79 %
Platelets: 262 10*3/uL (ref 150–400)
RBC: 4.7 MIL/uL (ref 4.22–5.81)
RDW: 13.9 % (ref 11.5–15.5)
WBC: 13.6 10*3/uL — ABNORMAL HIGH (ref 4.0–10.5)
nRBC: 0 % (ref 0.0–0.2)

## 2018-07-29 LAB — CK: Total CK: 280 U/L (ref 49–397)

## 2018-07-29 IMAGING — CR DG SHOULDER 2+V*L*
2 series · 2 of 2 positions shown · non-contrast
Comparison: [DATE] and chest x-ray [DATE]

CLINICAL DATA: Severe left shoulder pain since last night. No
trauma. History of kidney cancer.

EXAM:
LEFT SHOULDER - 2+ VIEW

[w shoulder external left]
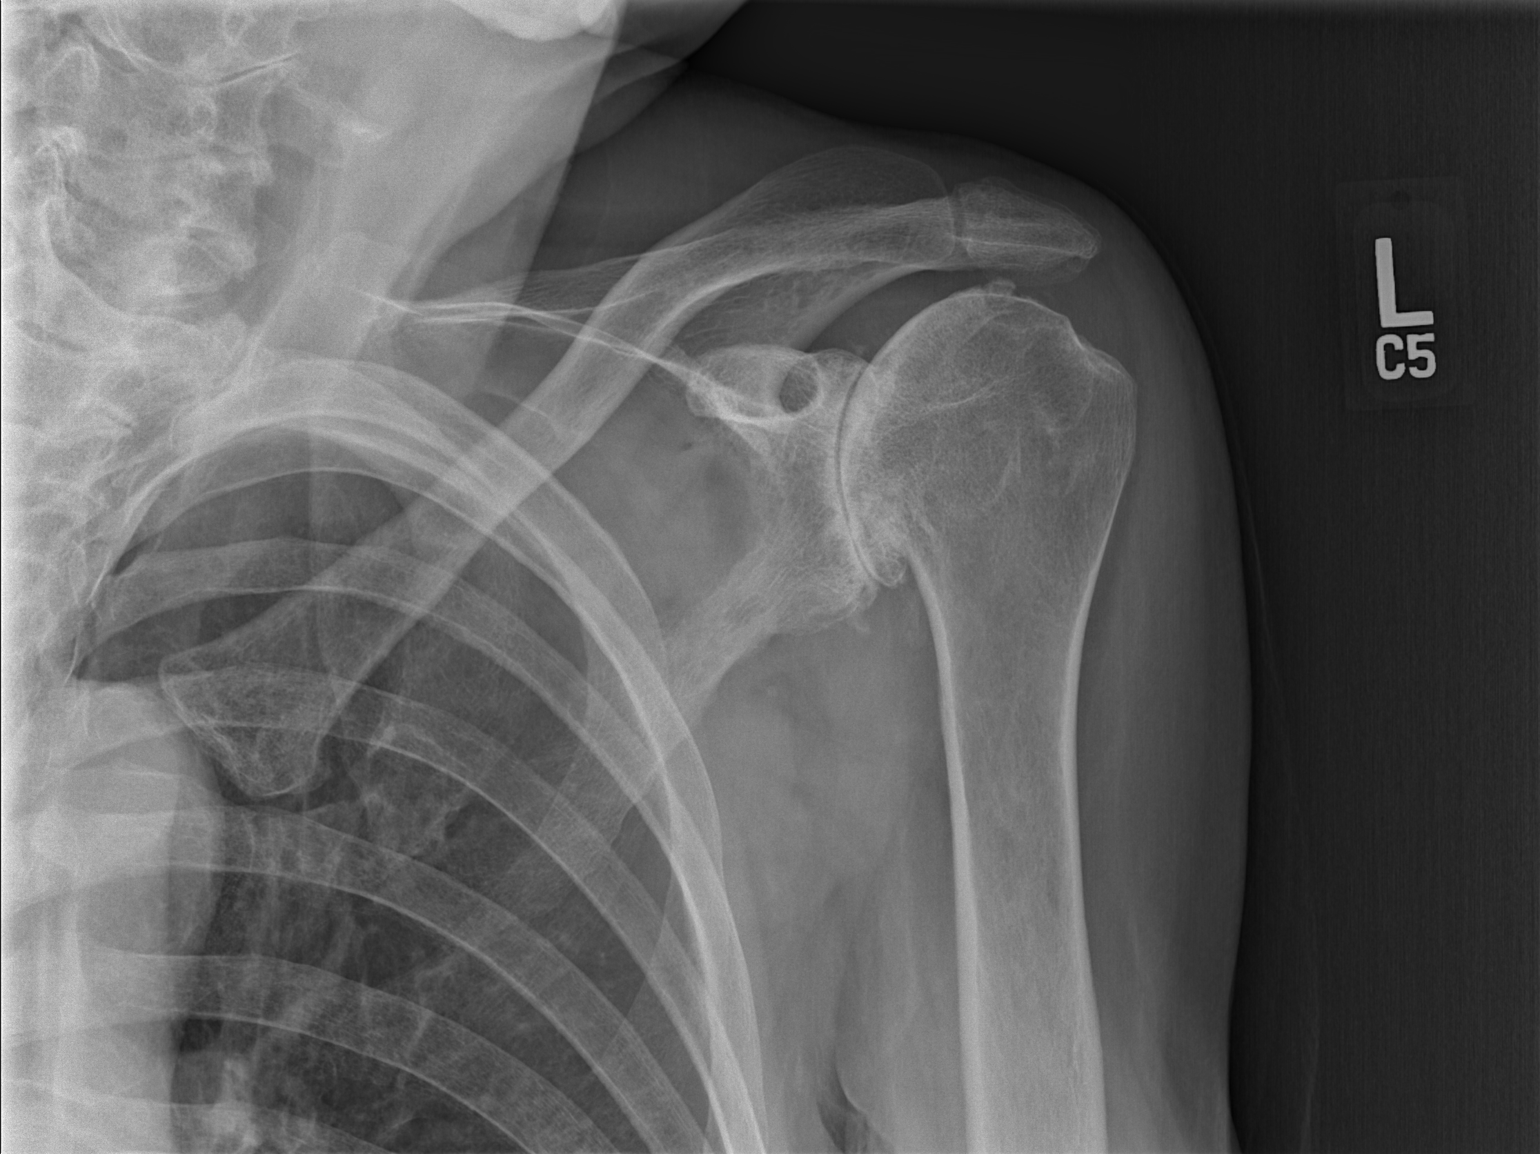

[w shoulder y-view left]
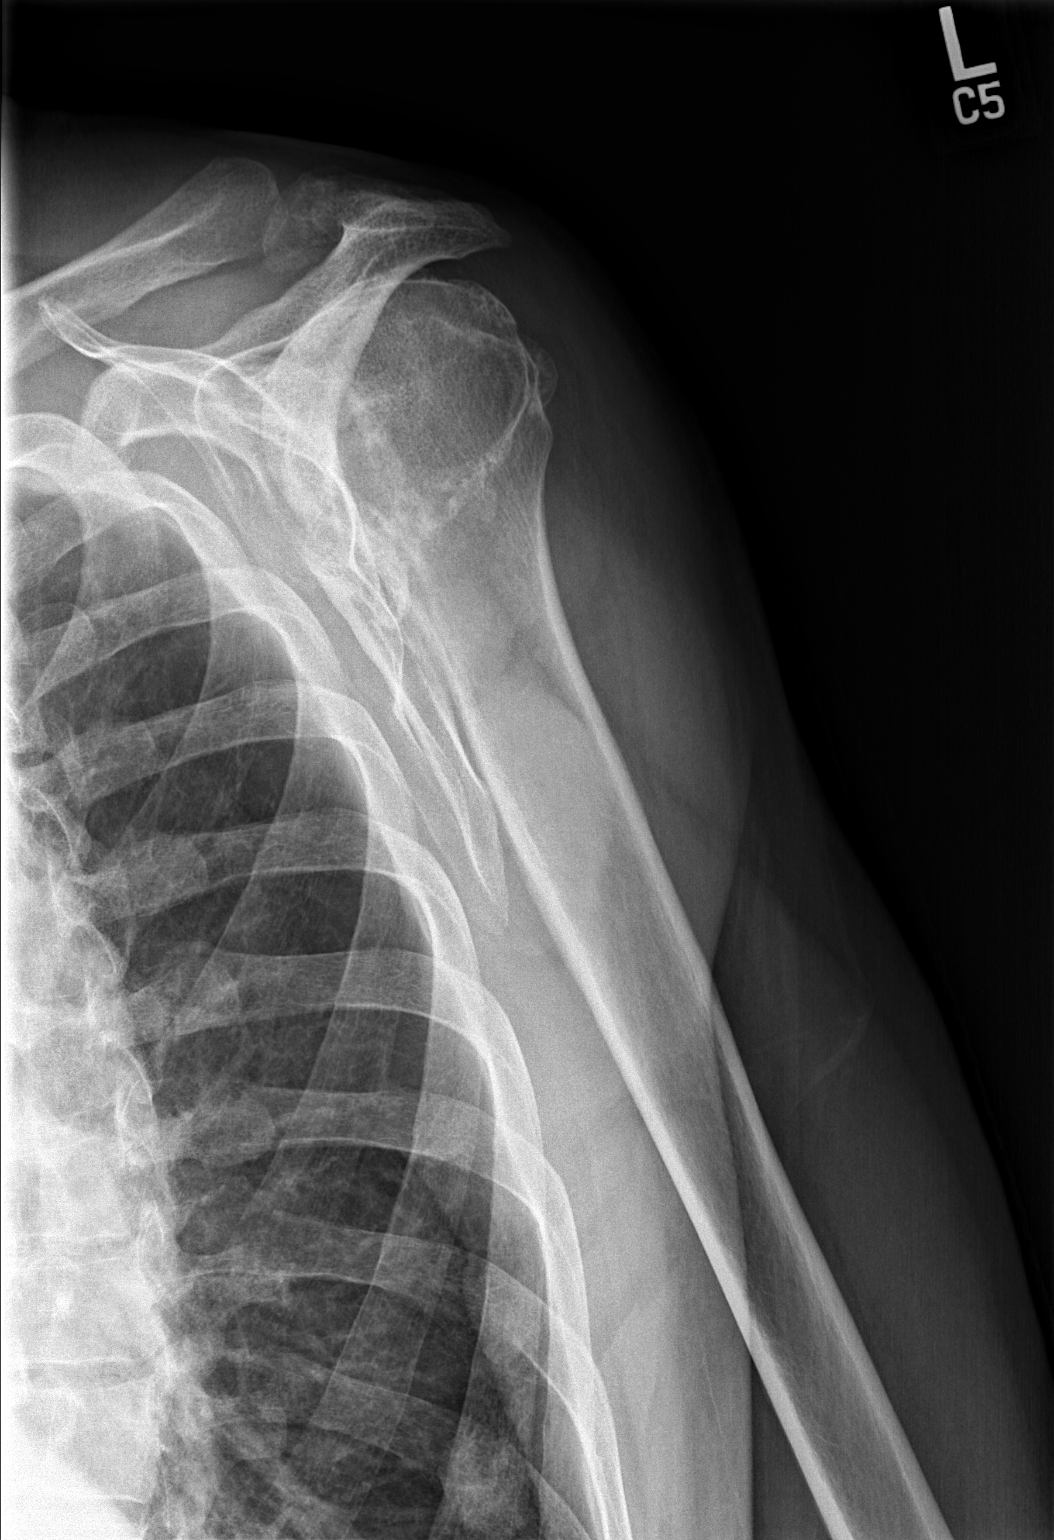

[2 of 2 positions shown; findings below may reference images not displayed]

FINDINGS: Interval progression of moderate to severe degenerative changes of
the glenohumeral joint. No evidence of acute fracture or
dislocation. No evidence of focal lytic or sclerotic lesion. Minimal
degenerative change of the AC joint.
IMPRESSION: No acute findings. Interval progression of moderate to severe
degenerative change of the glenohumeral joint.

## 2018-07-29 MED ORDER — HYDROMORPHONE HCL 1 MG/ML IJ SOLN
1.0000 mg | Freq: Once | INTRAMUSCULAR | Status: AC
  Administered 2018-07-29: 1 mg via INTRAVENOUS
  Filled 2018-07-29: qty 1

## 2018-07-29 MED ORDER — FENTANYL CITRATE (PF) 100 MCG/2ML IJ SOLN
100.0000 ug | Freq: Once | INTRAMUSCULAR | Status: AC
  Administered 2018-07-29: 100 ug via INTRAVENOUS
  Filled 2018-07-29: qty 2

## 2018-07-29 MED ORDER — SODIUM CHLORIDE 0.9 % IV BOLUS
1000.0000 mL | Freq: Once | INTRAVENOUS | Status: AC
  Administered 2018-07-29: 1000 mL via INTRAVENOUS

## 2018-07-29 MED ORDER — DIAZEPAM 5 MG/ML IJ SOLN
5.0000 mg | Freq: Once | INTRAMUSCULAR | Status: AC
  Administered 2018-07-29: 5 mg via INTRAVENOUS
  Filled 2018-07-29: qty 2

## 2018-07-29 NOTE — ED Provider Notes (Signed)
[facility] DEPT Provider Note   CSN: [id number] Arrival date & time: 07/29/18  0740     History   Chief Complaint Chief Complaint  Patient presents with  . Shoulder Pain    HPI Douglas [name] is a 63 y.o. male history of COPD, chronic left shoulder pain, renal cell cancer on experimental treatment here presenting with left shoulder pain.  Patient has left shoulder pain for the last several months.  Patient saw Dr. Mardelle Matte for this problem and is supposed to get shoulder replacement but since he has underlying renal cell carcinoma, he was unable to get shoulder replacement.  Patient states that he woke around 2-3 AM with severe left shoulder cramping and pain.  He is prescribed hydrocodone 7.5 mg for pain and took several doses with no relief.  Patient denies any trauma or injury.  Denies any fevers or chills.   The history is provided by the patient.    Past Medical History:  Diagnosis Date  . Allergic rhinitis   . Arthritis    "all over my body" (10/16/2013)  . Chronic back pain    "neck to lower back" (10/16/2013)  . Compression fracture    S/P L-SPINE; pt does not recall this hx on 10/16/2013  . COPD (chronic obstructive pulmonary disease) (HCC)    FVC .66  . Depression    "situational since 09/22/2013 OR"  . Encounter for long-term (current) use of other medications   . GERD (gastroesophageal reflux disease)   . Hemoptysis   . 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 History of blood transfusion 09/2013   "think so; not sure; related to big OR"  . History of surgical fusion joint    DDD C-SPINE  . Hyperlipidemia   . Hypertension   . Leiomyoma OF SKIN   INCREASED RISK RENAL CELL CA  NEEDS CT OF KIDNEYS EVERY 2 YEARS NEXT DUE  02/06/13  . Melanoma in situ ([place])   . Melanoma of back ([place])   . Neuromuscular disorder ([place])   . Osteoarthritis   . Renal calculi   . Renal cell carcinoma ([place])    type 2; papillary  . Tobacco  use disorder   . Unspecified vitamin D deficiency     Patient Active Problem List   Diagnosis Date Noted  . Bone metastases ([place]) 07/26/2016  . CAD (coronary artery disease) 03/25/2014  . Protein-calorie malnutrition, severe ([place]) 10/17/2013  . 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 Renal calculi   . GERD (gastroesophageal reflux disease)   . Allergic rhinitis   . Osteoarthritis   . Encounter for long-term (current) use of other medications   . Hyperlipidemia   . Tobacco use disorder   . COPD (chronic obstructive pulmonary disease) ([place])   . Unspecified vitamin D deficiency   . Hypertension     Past Surgical History:  Procedure Laterality Date  . ANTERIOR CERVICAL DECOMP/DISCECTOMY FUSION  1992  . ANTERIOR CRUCIATE LIGAMENT REPAIR Bilateral [phone]  . APPENDECTOMY  09/22/2013  . CHOLECYSTECTOMY  09/22/2013   "had tumors in it"  . HAND LIGAMENT RECONSTRUCTION Right   . HARVEST BONE GRAFT Right 1992   "hip; for neck fusion"  . INGUINAL HERNIA REPAIR Right 2004  . LIGAMENT REPAIR Right ~ 2001   "little finger"  . LYMPH NODE DISSECTION Right 09/22/2013   "in the area of the kidney; they were clean"  . MELANOMA EXCISION  ~ 2007   "  off my back"  . PARTIAL NEPHRECTOMY Right 09/22/2013  . removal of fascia overlying the right psoas Right 09/22/2013  . RENAL ARTERY STENT Right 09/22/2013  . RIGHT COLECTOMY Right 09/22/2013  . ROBOTIC ASSITED PARTIAL NEPHRECTOMY  05/22/2012   Procedure: ROBOTIC ASSITED PARTIAL NEPHRECTOMY;  Surgeon: Alexis Frock, MD;  Location: [facility] ORS;  Service: Urology;  Laterality: Right;  Right Robotic Cyst Decortication and Partial Nephrectomy   . SPINE SURGERY          Home Medications    Prior to Admission medications   Medication Sig Start Date End Date Taking? Authorizing Provider  amLODipine (NORVASC) 10 MG tablet Take 10 mg daily by mouth.   Yes [provider]  atorvastatin (LIPITOR) 10 MG tablet  TAKE 1 BY MOUTH AT BEDTIME Patient taking differently: Take 10 mg by mouth daily at 6 PM. TAKE 1 BY MOUTH AT BEDTIME 04/30/17  Yes Copland, Gay Filler, MD  B Complex Vitamins (B COMPLEX 100 PO) Take 100 mg by mouth daily.   Yes [provider]  Bevacizumab (AVASTIN) 100 MG/4ML SOLN Inject into the vein.   Yes [provider]  cetirizine (ZYRTEC) 10 MG tablet Take 5 mg by mouth daily.    Yes [provider]  denosumab (XGEVA) 120 MG/1.7ML SOLN injection Inject 120 mg into the skin every 30 (thirty) days.    Yes [provider]  doxycycline (ADOXA) 100 MG tablet Take 100 mg by mouth 2 (two) times daily.   Yes [provider]  DULoxetine (CYMBALTA) 20 MG capsule Take 1 capsule (20 mg total) by mouth 2 (two) times daily. Patient taking differently: Take 20 mg by mouth daily.  04/15/18  Yes Copland, Gay Filler, MD  erlotinib (TARCEVA) 150 MG tablet Take 150 mg by mouth daily. Take on an empty stomach 1 hour before meals or 2 hours after   Yes [provider]  HYDROcodone-acetaminophen (NORCO) 7.5-325 MG tablet Take 1-2 tablets every 4-6 hours as needed for pain. To fill 07/20/2018 Patient taking differently: Take 1-2 tablets by mouth every 4 (four) hours as needed for moderate pain. Take 1-2 tablets every 4-6 hours as needed for pain. To fill 07/20/2018 04/15/18  Yes Copland, Gay Filler, MD  lidocaine (LIDODERM) 5 % Place 1 patch onto the skin daily. Remove & Discard patch within 12 hours or as directed by MD.  May wear up to 3 patches at a time 08/09/17  Yes Copland, Gay Filler, MD  loperamide (IMODIUM A-D) 2 MG tablet Take 2 mg by mouth 4 (four) times daily as needed for diarrhea or loose stools.   Yes [provider]  LORazepam (ATIVAN) 1 MG tablet TAKE ONE HALF OR ONE TABLET BY MOUTH AT BEDTIME IF NEEDED FOR SLEEP, DO NOT TAKE WITH PAIN MEDS Patient taking differently: 0.5-1 mg at bedtime as needed for sleep. TAKE ONE HALF OR ONE TABLET BY MOUTH AT  BEDTIME IF NEEDED FOR SLEEP, DO NOT TAKE WITH PAIN MEDS 05/17/18  Yes Copland, Gay Filler, MD  losartan (COZAAR) 50 MG tablet TAKE 1 BY MOUTH DAILY BEFORE BREAKFAST Patient taking differently: Take 50 mg by mouth daily. TAKE 1 BY MOUTH DAILY BEFORE BREAKFAST 04/30/17  Yes Copland, Gay Filler, MD  Multiple Vitamins-Minerals (CENTRUM SILVER 50+MEN PO) Take 1 tablet by mouth daily.   Yes [provider]  omega-3 acid ethyl esters (LOVAZA) 1 g capsule Take 1 capsule (1 g total) by mouth 2 (two) times daily. Patient taking differently: Take 1 g  by mouth daily.  05/21/17  Yes Copland, Gay Filler, MD  ondansetron (ZOFRAN-ODT) 4 MG disintegrating tablet TAKE 1 TABLET BY MOUTH EVERY 8 HOURS AS NEEDED FOR NAUSEA/VOMITING Patient taking differently: Take 4 mg by mouth every 8 (eight) hours as needed for nausea or vomiting. TAKE 1 TABLET BY MOUTH EVERY 8 HOURS AS NEEDED FOR NAUSEA/VOMITING 07/23/18  Yes Copland, Gay Filler, MD  pantoprazole (PROTONIX) 20 MG tablet Take 20 mg by mouth daily.   Yes [provider]  UNABLE TO FIND Take 1 tablet by mouth daily. Med Name: Calcium 600/D-3 300 2 daily    Yes [provider]  vitamin C (ASCORBIC ACID) 500 MG tablet Take 500 mg by mouth daily.   Yes [provider]  HYDROcodone-acetaminophen (NORCO) 7.5-325 MG tablet 1-2 tablets every 4-6 hours for pain. Patient not taking: Reported on 07/29/2018 04/15/18   Copland, Gay Filler, MD  HYDROcodone-acetaminophen (NORCO) 7.5-325 MG tablet Take 1-2 tablets every 4-6 hours as needed for pain. To fill 06/14/18 Patient not taking: Reported on 07/29/2018 05/28/18   Copland, Gay Filler, MD  ondansetron (ZOFRAN-ODT) 4 MG disintegrating tablet Take 1 every 8 hours as needed for nausea or vomiting Patient not taking: Reported on 07/29/2018 07/23/18   Copland, Gay Filler, MD    Family History Family History  Adopted: Yes  Problem Relation Age of Onset  . Hypertension Father     Social History Social  History   Tobacco Use  . Smoking status: Former Smoker    Packs/day: 0.50    Years: 40.00    Pack years: 20.00    Types: Cigarettes  . Smokeless tobacco: Never Used  . Tobacco comment: Pt chewing Nicorette gum currently  Substance Use Topics  . Alcohol use: Yes    Alcohol/week: 0.0 standard drinks    Comment: 10/16/2013 "2-3 beers daily generally; nothing last month or so"  . Drug use: No     Allergies   Avelox [moxifloxacin hcl in nacl]; Skin adhesives; and Tape   Review of Systems Review of Systems  Musculoskeletal:       L shoulder pain   All other systems reviewed and are negative.    Physical Exam Updated Vital Signs BP (!) 147/90   Pulse 93   Temp 98 F (36.7 C) (Oral)   Resp 18   Ht 6\' 1"  (1.854 m)   Wt 73 kg   SpO2 96%   BMI 21.24 kg/m   Physical Exam Vitals signs and nursing note reviewed.  Constitutional:      Appearance: Normal appearance.  HENT:     Head: Normocephalic.     Nose: Nose normal.     Mouth/Throat:     Mouth: Mucous membranes are moist.  Eyes:     Extraocular Movements: Extraocular movements intact.     Pupils: Pupils are equal, round, and reactive to light.  Neck:     Musculoskeletal: Normal range of motion.  Cardiovascular:     Rate and Rhythm: Normal rate.  Pulmonary:     Effort: Pulmonary effort is normal.     Breath sounds: Normal breath sounds.  Abdominal:     General: Abdomen is flat.  Musculoskeletal:     Comments: L shoulder dec ROM, mild tenderness on the deltoid. No obvious deformity. 2+ radial pulses, nl hand grasp.   Skin:    General: Skin is warm.     Capillary Refill: Capillary refill takes less than 2 seconds.  Neurological:     General:  No focal deficit present.     Mental Status: He is alert and oriented to person, place, and time.  Psychiatric:        Mood and Affect: Mood normal.      ED Treatments / Results  Labs (all labs ordered are listed, but only abnormal results are displayed) Labs  Reviewed  CBC WITH DIFFERENTIAL/PLATELET - Abnormal; Notable for the following components:      Result Value   WBC 13.6 (*)    Neutro Abs 10.7 (*)    Abs Immature Granulocytes 0.08 (*)    All other components within normal limits  BASIC METABOLIC PANEL - Abnormal; Notable for the following components:   BUN 25 (*)    All other components within normal limits  CK    EKG None  Radiology Dg Shoulder Left  Result Date: 07/29/2018 CLINICAL DATA:  Severe left shoulder pain since last night. No trauma. History of kidney cancer. EXAM: LEFT SHOULDER - 2+ VIEW COMPARISON:  02/14/2011 and chest x-ray 02/20/2012 FINDINGS: Interval progression of moderate to severe degenerative changes of the glenohumeral joint. No evidence of acute fracture or dislocation. No evidence of focal lytic or sclerotic lesion. Minimal degenerative change of the [facility] joint. IMPRESSION: No acute findings. Interval progression of moderate to severe degenerative change of the glenohumeral joint. Electronically Signed   By: Marin Olp M.D.   On: 07/29/2018 08:47    Procedures Procedures (including critical care time)  Medications Ordered in ED Medications  HYDROmorphone (DILAUDID) injection 1 mg (1 mg Intravenous Given 07/29/18 0904)  diazepam (VALIUM) injection 5 mg (5 mg Intravenous Given 07/29/18 0902)  sodium chloride 0.9 % bolus 1,000 mL (1,000 mLs Intravenous New Bag/Given 07/29/18 0908)  diazepam (VALIUM) injection 5 mg (5 mg Intravenous Given 07/29/18 1012)  fentaNYL (SUBLIMAZE) injection 100 mcg (100 mcg Intravenous Given 07/29/18 1011)     Initial Impression / Assessment and Plan / ED Course  I have reviewed the triage vital signs and the nursing notes.  Pertinent labs & imaging results that were available during my care of the patient were reviewed by me and considered in my medical decision making (see chart for details).    Douglas [name] is a 63 y.o. male here with L shoulder pain. Has chronic L shoulder  pain and supposed to get shoulder replacement but also has renal cancer so is just getting pain meds for now. I reviewed narcotics database and he just filled 240 hydrocodone pills on 1/25. He has no signs of trauma. Likely muscle spasms. Will give pain meds, muscle relaxants.   10:55 AM Cr stable. Xray showed arthritis. Minimal improvement with dilaudid, valium, and fentanyl. I think he may benefit from steroid shot. I talked to Dr. Mardelle Matte, who is able to see him in the office today for further evaluation and pain control and possible joint injection.    Final Clinical Impressions(s) / ED Diagnoses   Final diagnoses:  None    ED Discharge Orders    None       Drenda Freeze, MD 07/29/18 1056

## 2018-07-29 NOTE — Discharge Instructions (Signed)
Go to Dr. Luanna Cole office right away. He will see you today.   Continue your current pain meds unless instructed otherwise by Dr. Mardelle Matte.   Return to ER if you have worse joint pain or swelling, fever.

## 2018-07-29 NOTE — ED Notes (Signed)
Pt states he has an appointment for a hydrocortisone injection in his L shoulder next week.

## 2018-07-29 NOTE — ED Triage Notes (Signed)
Pt presents with c/o left shoulder pain. Pt reports that he has needed a total shoulder replacement but is currently in a drug trial for stage 4 kidney cancer. No new injury to the shoulder, just pain that has been getting progressively worse. Pt reports the pain became worse around 1 or 2 this morning.

## 2018-07-29 NOTE — ED Notes (Signed)
Patient transported to X-ray 

## 2018-07-30 NOTE — Telephone Encounter (Signed)
Called him to discuss recent shoulder issues Offered support

## 2018-08-01 ENCOUNTER — Encounter: Payer: Self-pay | Admitting: Family Medicine

## 2018-08-05 DIAGNOSIS — M19012 Primary osteoarthritis, left shoulder: Secondary | ICD-10-CM | POA: Diagnosis not present

## 2018-08-06 ENCOUNTER — Other Ambulatory Visit: Payer: Self-pay

## 2018-08-06 DIAGNOSIS — I10 Essential (primary) hypertension: Secondary | ICD-10-CM

## 2018-08-06 MED ORDER — LOSARTAN POTASSIUM 50 MG PO TABS: 50.0000 mg | ORAL_TABLET | Freq: Every day | ORAL | 1 refills | Status: DC

## 2018-08-07 ENCOUNTER — Other Ambulatory Visit: Payer: Self-pay | Admitting: Family Medicine

## 2018-08-07 DIAGNOSIS — E782 Mixed hyperlipidemia: Secondary | ICD-10-CM

## 2018-08-07 MED ORDER — ATORVASTATIN CALCIUM 10 MG PO TABS: ORAL_TABLET | ORAL | 3 refills | Status: DC

## 2018-08-07 NOTE — Telephone Encounter (Signed)
Copied from [facility] [id number]. Topic: Quick Communication - Rx Refill/Question >> Aug 07, 2018  9:43 AM Ahmed Prima L wrote: Medication: atorvastatin (LIPITOR) 10 MG tablet  Has the patient contacted their pharmacy? Yes on Sunday  (Agent: If no, request that the patient contact the pharmacy for the refill.) (Agent: If yes, when and what did the pharmacy advise?)  Preferred Pharmacy (with phone number or street name): [id number] - [place], [place] [address] Phone: [phone] Fax: [phone]    Agent: Please be advised that RX refills may take up to 3 business days. We ask that you follow-up with your pharmacy.

## 2018-08-08 ENCOUNTER — Encounter: Payer: Self-pay | Admitting: Family Medicine

## 2018-08-08 DIAGNOSIS — C641 Malignant neoplasm of right kidney, except renal pelvis: Secondary | ICD-10-CM

## 2018-08-08 DIAGNOSIS — I10 Essential (primary) hypertension: Secondary | ICD-10-CM

## 2018-08-08 DIAGNOSIS — G893 Neoplasm related pain (acute) (chronic): Secondary | ICD-10-CM

## 2018-08-08 MED ORDER — HYDROCODONE-ACETAMINOPHEN 7.5-325 MG PO TABS: ORAL_TABLET | ORAL | 0 refills | Status: DC

## 2018-08-08 NOTE — Telephone Encounter (Signed)
07/15/2018  3   05/17/2018  Lorazepam 1 MG Tablet  30.00 30 Je Cop   [id number]   [place] ([id number])   2  1.00 LME  Private Pay   [place]  07/13/2018  2   04/15/2018  Hydrocodone-Acetamin 7.5-325  240.00 40 Je Cop   [id number]   [facility] ([id number])   0  45.00 MME  Comm Ins   [place]  06/14/2018  2   05/28/2018  Hydrocodone-Acetamin 7.5-325  240.00 20 Je Cop   [id number]   Wal ([id number])   0  90.00 MME  Comm Ins   [place]  06/13/2018  3   05/17/2018  Lorazepam 1 MG Tablet  30.00 30 Je Cop   [id number]   [facility] ([id number])   1  1.00 LME  Comm Ins   [place]  05/20/2018  2   04/15/2018  Hydrocodone-Acetamin 7.5-325  240.00 20 Je Cop   [id number]   [facility] ([id number])   0  90.00 MME  Comm Ins      UDS is UTD

## 2018-08-15 ENCOUNTER — Encounter: Payer: Self-pay | Admitting: Family Medicine

## 2018-08-15 DIAGNOSIS — F329 Major depressive disorder, single episode, unspecified: Secondary | ICD-10-CM

## 2018-08-16 MED ORDER — LORAZEPAM 1 MG PO TABS: 0.5000 mg | ORAL_TABLET | Freq: Every evening | ORAL | 4 refills | Status: DC | PRN

## 2018-09-02 ENCOUNTER — Encounter: Payer: Self-pay | Admitting: Family Medicine

## 2018-09-02 DIAGNOSIS — C641 Malignant neoplasm of right kidney, except renal pelvis: Secondary | ICD-10-CM

## 2018-09-02 DIAGNOSIS — G893 Neoplasm related pain (acute) (chronic): Secondary | ICD-10-CM

## 2018-09-02 MED ORDER — HYDROCODONE-ACETAMINOPHEN 7.5-325 MG PO TABS: ORAL_TABLET | ORAL | 0 refills | Status: DC

## 2018-09-05 DIAGNOSIS — R69 Illness, unspecified: Secondary | ICD-10-CM | POA: Diagnosis not present

## 2018-10-01 ENCOUNTER — Encounter: Payer: Self-pay | Admitting: Family Medicine

## 2018-10-01 DIAGNOSIS — G893 Neoplasm related pain (acute) (chronic): Secondary | ICD-10-CM

## 2018-10-01 DIAGNOSIS — C641 Malignant neoplasm of right kidney, except renal pelvis: Secondary | ICD-10-CM

## 2018-10-01 MED ORDER — HYDROCODONE-ACETAMINOPHEN 7.5-325 MG PO TABS: ORAL_TABLET | ORAL | 0 refills | Status: DC

## 2018-10-02 MED ORDER — HYDROCODONE-ACETAMINOPHEN 7.5-325 MG PO TABS: ORAL_TABLET | ORAL | 0 refills | Status: DC

## 2018-10-02 NOTE — Addendum Note (Signed)
Addended by: Lamar Blinks C on: 10/02/2018 08:44 AM   Modules accepted: Orders

## 2018-10-02 NOTE — Telephone Encounter (Signed)
[facility]. [facility]

## 2018-10-11 ENCOUNTER — Telehealth: Payer: Self-pay | Admitting: *Deleted

## 2018-10-11 NOTE — Telephone Encounter (Signed)
Received Medical records from [facility]; forwarded to provider/SLS 04/24

## 2018-10-15 DIAGNOSIS — R809 Proteinuria, unspecified: Secondary | ICD-10-CM | POA: Diagnosis not present

## 2018-10-15 DIAGNOSIS — N182 Chronic kidney disease, stage 2 (mild): Secondary | ICD-10-CM | POA: Diagnosis not present

## 2018-10-15 DIAGNOSIS — Z1509 Genetic susceptibility to other malignant neoplasm: Secondary | ICD-10-CM | POA: Diagnosis not present

## 2018-10-15 DIAGNOSIS — I129 Hypertensive chronic kidney disease with stage 1 through stage 4 chronic kidney disease, or unspecified chronic kidney disease: Secondary | ICD-10-CM | POA: Diagnosis not present

## 2018-10-17 DIAGNOSIS — E785 Hyperlipidemia, unspecified: Secondary | ICD-10-CM | POA: Diagnosis not present

## 2018-10-17 DIAGNOSIS — R809 Proteinuria, unspecified: Secondary | ICD-10-CM | POA: Diagnosis not present

## 2018-10-17 DIAGNOSIS — C641 Malignant neoplasm of right kidney, except renal pelvis: Secondary | ICD-10-CM | POA: Diagnosis not present

## 2018-10-29 ENCOUNTER — Encounter: Payer: Self-pay | Admitting: Family Medicine

## 2018-10-29 DIAGNOSIS — C641 Malignant neoplasm of right kidney, except renal pelvis: Secondary | ICD-10-CM

## 2018-10-29 DIAGNOSIS — G893 Neoplasm related pain (acute) (chronic): Secondary | ICD-10-CM

## 2018-10-29 MED ORDER — HYDROCODONE-ACETAMINOPHEN 7.5-325 MG PO TABS: ORAL_TABLET | ORAL | 0 refills | Status: DC

## 2018-11-07 ENCOUNTER — Encounter: Payer: Self-pay | Admitting: Family Medicine

## 2018-11-07 ENCOUNTER — Other Ambulatory Visit: Payer: Self-pay | Admitting: Family Medicine

## 2018-11-07 DIAGNOSIS — R110 Nausea: Secondary | ICD-10-CM

## 2018-11-07 MED ORDER — ONDANSETRON 4 MG PO TBDP: ORAL_TABLET | ORAL | 2 refills | Status: DC

## 2018-11-25 ENCOUNTER — Encounter: Payer: Self-pay | Admitting: Family Medicine

## 2018-11-25 DIAGNOSIS — F329 Major depressive disorder, single episode, unspecified: Secondary | ICD-10-CM

## 2018-11-25 DIAGNOSIS — G893 Neoplasm related pain (acute) (chronic): Secondary | ICD-10-CM

## 2018-11-25 DIAGNOSIS — C641 Malignant neoplasm of right kidney, except renal pelvis: Secondary | ICD-10-CM

## 2018-11-25 MED ORDER — DULOXETINE HCL 20 MG PO CPEP: 20.0000 mg | ORAL_CAPSULE | Freq: Every day | ORAL | 3 refills | Status: DC

## 2018-11-25 MED ORDER — HYDROCODONE-ACETAMINOPHEN 7.5-325 MG PO TABS: ORAL_TABLET | ORAL | 0 refills | Status: DC

## 2018-12-04 ENCOUNTER — Encounter: Payer: Self-pay | Admitting: Family Medicine

## 2018-12-24 ENCOUNTER — Encounter: Payer: Self-pay | Admitting: Family Medicine

## 2018-12-24 DIAGNOSIS — C641 Malignant neoplasm of right kidney, except renal pelvis: Secondary | ICD-10-CM

## 2018-12-24 DIAGNOSIS — G893 Neoplasm related pain (acute) (chronic): Secondary | ICD-10-CM

## 2018-12-24 MED ORDER — HYDROCODONE-ACETAMINOPHEN 7.5-325 MG PO TABS: ORAL_TABLET | ORAL | 0 refills | Status: DC

## 2018-12-25 DIAGNOSIS — M19012 Primary osteoarthritis, left shoulder: Secondary | ICD-10-CM | POA: Diagnosis not present

## 2019-01-10 ENCOUNTER — Encounter: Payer: Self-pay | Admitting: Family Medicine

## 2019-01-10 DIAGNOSIS — F329 Major depressive disorder, single episode, unspecified: Secondary | ICD-10-CM

## 2019-01-10 MED ORDER — LORAZEPAM 1 MG PO TABS: 0.5000 mg | ORAL_TABLET | Freq: Every evening | ORAL | 4 refills | Status: DC | PRN

## 2019-01-10 NOTE — Telephone Encounter (Signed)
I have pended refill for you!

## 2019-01-19 ENCOUNTER — Other Ambulatory Visit: Payer: Self-pay | Admitting: Family Medicine

## 2019-01-21 ENCOUNTER — Encounter: Payer: Self-pay | Admitting: Family Medicine

## 2019-01-21 DIAGNOSIS — G893 Neoplasm related pain (acute) (chronic): Secondary | ICD-10-CM

## 2019-01-21 DIAGNOSIS — C641 Malignant neoplasm of right kidney, except renal pelvis: Secondary | ICD-10-CM

## 2019-01-22 MED ORDER — HYDROCODONE-ACETAMINOPHEN 7.5-325 MG PO TABS: ORAL_TABLET | ORAL | 0 refills | Status: DC

## 2019-01-26 ENCOUNTER — Encounter: Payer: Self-pay | Admitting: Family Medicine

## 2019-01-31 DIAGNOSIS — R809 Proteinuria, unspecified: Secondary | ICD-10-CM | POA: Diagnosis not present

## 2019-01-31 DIAGNOSIS — Z1509 Genetic susceptibility to other malignant neoplasm: Secondary | ICD-10-CM | POA: Diagnosis not present

## 2019-01-31 DIAGNOSIS — N182 Chronic kidney disease, stage 2 (mild): Secondary | ICD-10-CM | POA: Diagnosis not present

## 2019-01-31 DIAGNOSIS — I129 Hypertensive chronic kidney disease with stage 1 through stage 4 chronic kidney disease, or unspecified chronic kidney disease: Secondary | ICD-10-CM | POA: Diagnosis not present

## 2019-02-03 DIAGNOSIS — N189 Chronic kidney disease, unspecified: Secondary | ICD-10-CM | POA: Diagnosis not present

## 2019-02-03 DIAGNOSIS — C649 Malignant neoplasm of unspecified kidney, except renal pelvis: Secondary | ICD-10-CM | POA: Diagnosis not present

## 2019-02-03 DIAGNOSIS — D481 Neoplasm of uncertain behavior of connective and other soft tissue: Secondary | ICD-10-CM | POA: Diagnosis not present

## 2019-02-03 DIAGNOSIS — Z1509 Genetic susceptibility to other malignant neoplasm: Secondary | ICD-10-CM | POA: Diagnosis not present

## 2019-02-03 DIAGNOSIS — N289 Disorder of kidney and ureter, unspecified: Secondary | ICD-10-CM | POA: Diagnosis not present

## 2019-02-04 ENCOUNTER — Other Ambulatory Visit: Payer: Self-pay | Admitting: Family Medicine

## 2019-02-04 ENCOUNTER — Encounter: Payer: Self-pay | Admitting: Family Medicine

## 2019-02-04 DIAGNOSIS — I10 Essential (primary) hypertension: Secondary | ICD-10-CM

## 2019-02-04 MED ORDER — LOSARTAN POTASSIUM 50 MG PO TABS: 50.0000 mg | ORAL_TABLET | Freq: Every day | ORAL | 3 refills | Status: DC

## 2019-02-10 ENCOUNTER — Other Ambulatory Visit: Payer: Self-pay

## 2019-02-10 ENCOUNTER — Ambulatory Visit (INDEPENDENT_AMBULATORY_CARE_PROVIDER_SITE_OTHER): Payer: Medicare HMO

## 2019-02-10 DIAGNOSIS — Z23 Encounter for immunization: Secondary | ICD-10-CM

## 2019-02-10 NOTE — Progress Notes (Signed)
Pre visit review using our clinic review tool, if applicable. No additional management support is needed unless otherwise documented below in the visit note.  Patient here today for flu injection. 0.59mL influenza vaccine given in patient's right deltoid IM. Patient tolerated well.

## 2019-02-19 ENCOUNTER — Encounter: Payer: Self-pay | Admitting: Family Medicine

## 2019-02-19 DIAGNOSIS — R110 Nausea: Secondary | ICD-10-CM

## 2019-02-19 DIAGNOSIS — G893 Neoplasm related pain (acute) (chronic): Secondary | ICD-10-CM

## 2019-02-19 DIAGNOSIS — C641 Malignant neoplasm of right kidney, except renal pelvis: Secondary | ICD-10-CM

## 2019-02-19 MED ORDER — HYDROCODONE-ACETAMINOPHEN 7.5-325 MG PO TABS: ORAL_TABLET | ORAL | 0 refills | Status: DC

## 2019-02-19 MED ORDER — ONDANSETRON 4 MG PO TBDP: ORAL_TABLET | ORAL | 2 refills | Status: DC

## 2019-02-19 NOTE — Telephone Encounter (Signed)
Saturday 02-22-19, and it will need to be sent to the [facility] at [address] near your old office at [place]. We are leaving first thing Monday for [place] to rest[place] and when we get back [place] have a day or [place]. I also need my Ondansetron ODT 4mg  tabs refilled. [facility] [address].

## 2019-02-25 LAB — CBC AND DIFFERENTIAL
HCT: 46 (ref 41–53)
Hemoglobin: 15.8 (ref 13.5–17.5)
Platelets: 233 (ref 150–399)
WBC: 9.3

## 2019-02-25 LAB — COMPREHENSIVE METABOLIC PANEL WITH GFR
Albumin: 4 (ref 3.5–5.0)
Calcium: 2.2 — AB (ref 8.7–10.7)

## 2019-02-25 LAB — BASIC METABOLIC PANEL WITH GFR
BUN: 16 (ref 4–21)
Chloride: 100 (ref 99–108)
Creatinine: 1.2 (ref 0.6–1.3)
Glucose: 99
Potassium: 4.1 (ref 3.4–5.3)
Sodium: 135 — AB (ref 137–147)

## 2019-02-25 LAB — HEPATIC FUNCTION PANEL: Alkaline Phosphatase: 44 (ref 25–125)

## 2019-02-25 LAB — TSH: TSH: 3.29 (ref 0.41–5.90)

## 2019-03-09 ENCOUNTER — Encounter: Payer: Self-pay | Admitting: Family Medicine

## 2019-03-17 NOTE — Progress Notes (Signed)
[place] at [facility] Douglas [name], Douglas [name], Douglas [name] [id number] [phone]  Date:  03/19/2019   Name:  Douglas [name]   DOB:  1955-12-20   MRN:  [id number]  PCP:  Darreld Mclean, MD    Chief Complaint: Hypertension (change amlodipine to nifedipine 30 mg, diastolic number high) and Psychological Concerns   History of Present Illness:  Douglas [name] is a 63 y.o. very pleasant male patient who presents with the following:  Patient with a complex medical history, here today to discuss psychologic concerns and hypertension His wife Douglas [name] is with him today, contributes to the history  He has history of hereditary leiomyomatosis and renal cell cancer, with mets to bone.  [facility]  [facility], for assessment and infusion He has has hypertension and hyperlipidemia as well  His BP at home is variable- may run 130/100- 110, and then can be too low at times as well to about 101/70 He is taking amlodipine and losartan both in the am He [facility] is wanting to change him from amlodipine to nifedipine xl 30-they might increase to 60 depending his response.  They have asked me to write his prescription, which is fine  He has been losing weight- the [facility] notes that he has lost about 40 lbs since about 3 years ago- since the trial started. They encourage him to use MJ to help his appetite - they are thinking of starting him on marinol.  They plan to rx this for him at his next visit He will use MJ a couple of times a day prior to meals- he wonders how he will respond to the [facility], he is worried it will make him too loopy  [name] notes that he is mentally "not doing good" the last several weeks. He will tear up, just feeling really down, sad.   He is always tired which is hard for him.  In the afternoons he tends to have more energy, does not feel motivated to do much especially in the morning. He  has generally enjoyed fishing but has not much felt like doing this His social life is really limited due to his illness and people in his age group are often self isolate due to pandemic His support groups are not meeting due to pandemic He had a couple of new nodules on his most recent [facility]-this worries him He denies any suicidal ideation-in fact he is working really hard to stay alive Wt Readings from Last 3 Encounters:  03/19/19 160 lb (72.6 kg)  07/29/18 161 lb (73 kg)  07/11/18 168 lb (76.2 kg)   His teeth have been really problematic - some are breaking or falling out due to his medications He had a chronic rash and body hair loss for 2 years- both of these things are actually now better.  However this makes him worried that perhaps his infusions are no longer working  He uses ampicillin as needed for tooth pains.   [facility] is encouraging him to have a tooth taken out due to his meds and concern of jawbone necrosis  Patient Active Problem List   Diagnosis Date Noted  . Bone metastases ([place]) 07/26/2016  . CAD (coronary artery disease) 03/25/2014  . Protein-calorie malnutrition, severe ([place]) 10/17/2013  . 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 Renal calculi   . GERD (gastroesophageal  reflux disease)   . Allergic rhinitis   . Osteoarthritis   . Encounter for long-term (current) use of other medications   . Hyperlipidemia   . Tobacco use disorder   . COPD (chronic obstructive pulmonary disease) ([place])   . Unspecified vitamin D deficiency   . Hypertension     Past Medical History:  Diagnosis Date  . Allergic rhinitis   . Arthritis    "all over my body" (10/16/2013)  . Chronic back pain    "neck to lower back" (10/16/2013)  . Compression fracture    S/P L-SPINE; pt does not recall this hx on 10/16/2013  . COPD (chronic obstructive pulmonary disease) (HCC)    FVC .66  . Depression    "situational since 09/22/2013 OR"  . Encounter for  long-term (current) use of other medications   . GERD (gastroesophageal reflux disease)   . Hemoptysis   . 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 History of blood transfusion 09/2013   "think so; not sure; related to big OR"  . History of surgical fusion joint    DDD C-SPINE  . Hyperlipidemia   . Hypertension   . Leiomyoma OF SKIN   INCREASED RISK RENAL CELL CA  NEEDS CT OF KIDNEYS EVERY 2 YEARS NEXT DUE  02/06/13  . Melanoma in situ ([place])   . Melanoma of back ([place])   . Neuromuscular disorder ([place])   . Osteoarthritis   . Renal calculi   . Renal cell carcinoma ([place])    type 2; papillary  . Tobacco use disorder   . Unspecified vitamin D deficiency     Past Surgical History:  Procedure Laterality Date  . ANTERIOR CERVICAL DECOMP/DISCECTOMY FUSION  1992  . ANTERIOR CRUCIATE LIGAMENT REPAIR Bilateral [phone]  . APPENDECTOMY  09/22/2013  . CHOLECYSTECTOMY  09/22/2013   "had tumors in it"  . HAND LIGAMENT RECONSTRUCTION Right   . HARVEST BONE GRAFT Right 1992   "hip; for neck fusion"  . INGUINAL HERNIA REPAIR Right 2004  . LIGAMENT REPAIR Right ~ 2001   "little finger"  . LYMPH NODE DISSECTION Right 09/22/2013   "in the area of the kidney; they were clean"  . MELANOMA EXCISION  ~ 2007   "off my back"  . PARTIAL NEPHRECTOMY Right 09/22/2013  . removal of fascia overlying the right psoas Right 09/22/2013  . RENAL ARTERY STENT Right 09/22/2013  . RIGHT COLECTOMY Right 09/22/2013  . ROBOTIC ASSITED PARTIAL NEPHRECTOMY  05/22/2012   Procedure: ROBOTIC ASSITED PARTIAL NEPHRECTOMY;  Surgeon: Alexis Frock, MD;  Location: [facility];  Service: Urology;  Laterality: Right;  Right Robotic Cyst Decortication and Partial Nephrectomy   . SPINE SURGERY      Social History   Tobacco Use  . Smoking status: Former Smoker    Packs/day: 0.50    Years: 40.00    Pack years: 20.00    Types: Cigarettes  . Smokeless tobacco: Never Used  . Tobacco comment: Pt chewing  Nicorette gum currently  Substance Use Topics  . Alcohol use: Yes    Alcohol/week: 0.0 standard drinks    Comment: 10/16/2013 "2-3 beers daily generally; nothing last month or so"  . Drug use: No    Family History  Adopted: Yes  Problem Relation Age of Onset  . Hypertension Father     Allergies  Allergen Reactions  . Avelox [Moxifloxacin Hcl In Nacl] Hives, Shortness Of Breath and Swelling  . Skin Adhesives Dermatitis and Rash  .  Tape Dermatitis and Rash    Medication list has been reviewed and updated.  Current Outpatient Medications on File Prior to Visit  Medication Sig Dispense Refill  . amLODipine (NORVASC) 10 MG tablet Take 10 mg daily by mouth.    Marland Kitchen atorvastatin (LIPITOR) 10 MG tablet TAKE 1 BY MOUTH AT BEDTIME 90 tablet 3  . B Complex Vitamins (B COMPLEX 100 PO) Take 100 mg by mouth daily.    . Bevacizumab (AVASTIN) 100 MG/4ML SOLN Inject into the vein.    . cetirizine (ZYRTEC) 10 MG tablet Take 5 mg by mouth daily.     Marland Kitchen denosumab (XGEVA) 120 MG/1.7ML SOLN injection Inject 120 mg into the skin every 30 (thirty) days.     Marland Kitchen doxycycline (ADOXA) 100 MG tablet Take 100 mg by mouth 2 (two) times daily.    . DULoxetine (CYMBALTA) 20 MG capsule Take 1 capsule (20 mg total) by mouth daily. 90 capsule 3  . erlotinib (TARCEVA) 150 MG tablet Take 150 mg by mouth daily. Take on an empty stomach 1 hour before meals or 2 hours after    . HYDROcodone-acetaminophen (NORCO) 7.5-325 MG tablet Take 1-2 tablets every 4-6 hours as needed for pain. To fill 11/30/2018 240 tablet 0  . HYDROcodone-acetaminophen (NORCO) 7.5-325 MG tablet Take 1-2 tablets every 4-6 hours as needed for pain. 240 tablet 0  . lidocaine (LIDODERM) 5 % Place 1 patch onto the skin daily. Remove & Discard patch within 12 hours or as directed by MD.  May wear up to 3 patches at a time 60 patch 6  . loperamide (IMODIUM A-D) 2 MG tablet Take 2 mg by mouth 4 (four) times daily as needed for diarrhea or loose stools.    Marland Kitchen  LORazepam (ATIVAN) 1 MG tablet Take 0.5-1 tablets (0.5-1 mg total) by mouth at bedtime as needed for sleep. TAKE ONE HALF OR ONE TABLET BY MOUTH AT BEDTIME IF NEEDED FOR SLEEP, DO NOT TAKE WITH PAIN MEDS 30 tablet 4  . losartan (COZAAR) 50 MG tablet Take 1 tablet (50 mg total) by mouth daily. TAKE 1 BY MOUTH DAILY BEFORE BREAKFAST 90 tablet 3  . Multiple Vitamins-Minerals (CENTRUM SILVER 50+MEN PO) Take 1 tablet by mouth daily.    Marland Kitchen omega-3 acid ethyl esters (LOVAZA) 1 g capsule Take 1 capsule (1 g total) by mouth 2 (two) times daily. (Patient taking differently: Take 1 g by mouth daily. ) 180 capsule 3  . ondansetron (ZOFRAN-ODT) 4 MG disintegrating tablet TAKE 1 TABLET BY MOUTH EVERY 8 HOURS AS NEEDED FOR NAUSEA/VOMITING 40 tablet 2  . ondansetron (ZOFRAN-ODT) 4 MG disintegrating tablet TAKE 1 TABLET BY MOUTH SUBLINGUALLY EVERY 8 HOURS AS NEEDED FOR NAUSEA OR VOMITING 40 tablet 2  . pantoprazole (PROTONIX) 20 MG tablet Take 20 mg by mouth daily.    Marland Kitchen UNABLE TO FIND Take 1 tablet by mouth daily. Med Name: Calcium 600/D-3 300 2 daily     . vitamin C (ASCORBIC ACID) 500 MG tablet Take 500 mg by mouth daily.     No current facility-administered medications on file prior to visit.     Review of Systems:  As per HPI- otherwise negative. He is still walking a mile daily " no matter how bad I feel"  Physical Examination: Vitals:   03/19/19 1420  BP: 122/84  Pulse: 70  Resp: 16  Temp: 97.8 F (36.6 C)  SpO2: 98%   Vitals:   03/19/19 1420  Weight: 160 lb (72.6 kg)  Height: 6\' 1"  (1.854 m)   Body mass index is 21.11 kg/m. Ideal Body Weight: Weight in (lb) to have BMI = 25: 189.1  GEN: WDWN, NAD, Non-toxic, A & O x 3, appears his normal self HEENT: Atraumatic, Normocephalic. Neck supple. No masses, No LAD. Ears and Nose: No external deformity. CV: RRR, No M/G/R. No JVD. No thrill. No extra heart sounds. PULM: CTA B, no wheezes, crackles, rhonchi. No retractions. No resp. distress. No  accessory muscle use. EXTR: No c/c/e NEURO Normal gait.  PSYCH: Normally interactive. Conversant. Not depressed or anxious appearing.  Calm demeanor.    Assessment and Plan: Renal cell cancer, right ([place]) - Plan: HYDROcodone-acetaminophen ([place]) 7.5-325 MG tablet  Essential hypertension - Plan: NIFEdipine (ADALAT CC) 30 MG 24 hr tablet  Chronic pain due to neoplasm - Plan: HYDROcodone-acetaminophen (NORCO) 7.5-325 MG tablet  Reactive depression  Lack of appetite  Following up today for a few concerns Refilled studies hydrocodone, he will fill this next weekend Will substitute nifedipine XR 30 mg for his current amlodipine.  He plans to make this change tomorrow.  Wrote for 30 mg with 60 pills, he will touch base with [facility] with some readings in a few days and adjust as recommended  [name] is suffering a lot from anxiety and depression.  He is currently taking Cymbalta 20 mg.  We will increase this to 40 mg, he will let me know how it works for him As encouraged him to check back with some of his support groups, some of that may have restarted activity on a virtual basis  He is currently using marijuana for appetite stimulation.  At the [facility] plans to start him on Marinol, he is somewhat apprehensive but willing to give this a try  Spent about 45 minutes in conversation with the patient today, more than 50% in counseling or coordination of care  Signed Lamar Blinks, MD

## 2019-03-19 ENCOUNTER — Encounter: Payer: Self-pay | Admitting: Family Medicine

## 2019-03-19 ENCOUNTER — Other Ambulatory Visit: Payer: Self-pay

## 2019-03-19 ENCOUNTER — Ambulatory Visit (INDEPENDENT_AMBULATORY_CARE_PROVIDER_SITE_OTHER): Payer: Medicare HMO | Admitting: Family Medicine

## 2019-03-19 VITALS — BP 122/84 | HR 70 | Temp 97.8°F | Resp 16 | Ht 73.0 in | Wt 160.0 lb

## 2019-03-19 DIAGNOSIS — R630 Anorexia: Secondary | ICD-10-CM

## 2019-03-19 DIAGNOSIS — G893 Neoplasm related pain (acute) (chronic): Secondary | ICD-10-CM | POA: Diagnosis not present

## 2019-03-19 DIAGNOSIS — C641 Malignant neoplasm of right kidney, except renal pelvis: Secondary | ICD-10-CM | POA: Diagnosis not present

## 2019-03-19 DIAGNOSIS — I10 Essential (primary) hypertension: Secondary | ICD-10-CM | POA: Diagnosis not present

## 2019-03-19 DIAGNOSIS — F329 Major depressive disorder, single episode, unspecified: Secondary | ICD-10-CM

## 2019-03-19 DIAGNOSIS — R69 Illness, unspecified: Secondary | ICD-10-CM | POA: Diagnosis not present

## 2019-03-19 MED ORDER — HYDROCODONE-ACETAMINOPHEN 7.5-325 MG PO TABS: ORAL_TABLET | ORAL | 0 refills | Status: DC

## 2019-03-19 MED ORDER — NIFEDIPINE ER 30 MG PO TB24: 30.0000 mg | ORAL_TABLET | Freq: Every day | ORAL | 5 refills | Status: DC

## 2019-03-19 NOTE — Patient Instructions (Addendum)
Let's try increasing your cymbalta to 40 mg (2 of the 20 mg pills) for a few weeks.  Let me know how this works for you We will change you from amlodipine to nifedipine 30 mg - I will give you enough so that you can take 2 pills (60 mg) if instructed by the [facility]   We refilled your pain medications As we discussed, I think seeing a counselor or doing some sort of support group would be beneficial for you- certainly you have both been through a lot!

## 2019-04-04 DIAGNOSIS — M19012 Primary osteoarthritis, left shoulder: Secondary | ICD-10-CM | POA: Diagnosis not present

## 2019-04-06 ENCOUNTER — Encounter: Payer: Self-pay | Admitting: Family Medicine

## 2019-04-07 DIAGNOSIS — C649 Malignant neoplasm of unspecified kidney, except renal pelvis: Secondary | ICD-10-CM | POA: Diagnosis not present

## 2019-04-07 DIAGNOSIS — Z6821 Body mass index (BMI) 21.0-21.9, adult: Secondary | ICD-10-CM | POA: Diagnosis not present

## 2019-04-07 DIAGNOSIS — R634 Abnormal weight loss: Secondary | ICD-10-CM | POA: Diagnosis not present

## 2019-04-07 DIAGNOSIS — R188 Other ascites: Secondary | ICD-10-CM | POA: Diagnosis not present

## 2019-04-07 DIAGNOSIS — C7951 Secondary malignant neoplasm of bone: Secondary | ICD-10-CM | POA: Diagnosis not present

## 2019-04-07 DIAGNOSIS — Z1509 Genetic susceptibility to other malignant neoplasm: Secondary | ICD-10-CM | POA: Diagnosis not present

## 2019-04-07 DIAGNOSIS — Z79899 Other long term (current) drug therapy: Secondary | ICD-10-CM | POA: Diagnosis not present

## 2019-04-17 ENCOUNTER — Encounter: Payer: Self-pay | Admitting: Family Medicine

## 2019-04-17 DIAGNOSIS — C641 Malignant neoplasm of right kidney, except renal pelvis: Secondary | ICD-10-CM

## 2019-04-17 DIAGNOSIS — G893 Neoplasm related pain (acute) (chronic): Secondary | ICD-10-CM

## 2019-04-17 DIAGNOSIS — Z13220 Encounter for screening for lipoid disorders: Secondary | ICD-10-CM

## 2019-04-17 DIAGNOSIS — Z79899 Other long term (current) drug therapy: Secondary | ICD-10-CM

## 2019-04-17 MED ORDER — HYDROCODONE-ACETAMINOPHEN 7.5-325 MG PO TABS: ORAL_TABLET | ORAL | 0 refills | Status: DC

## 2019-05-01 ENCOUNTER — Other Ambulatory Visit: Payer: Self-pay

## 2019-05-01 ENCOUNTER — Encounter: Payer: Self-pay | Admitting: Family Medicine

## 2019-05-01 ENCOUNTER — Other Ambulatory Visit (INDEPENDENT_AMBULATORY_CARE_PROVIDER_SITE_OTHER): Payer: Medicare HMO

## 2019-05-01 DIAGNOSIS — Z13220 Encounter for screening for lipoid disorders: Secondary | ICD-10-CM

## 2019-05-01 DIAGNOSIS — Z79899 Other long term (current) drug therapy: Secondary | ICD-10-CM

## 2019-05-01 LAB — LIPID PANEL
Cholesterol: 175 mg/dL (ref 0–200)
HDL: 60.6 mg/dL (ref >39.00)
NonHDL: 114.13
Total CHOL/HDL Ratio: 3
Triglycerides: 241 mg/dL — ABNORMAL HIGH (ref 0.0–149.0)
VLDL: 48.2 mg/dL — ABNORMAL HIGH (ref 0.0–40.0)

## 2019-05-01 LAB — LDL CHOLESTEROL, DIRECT: Direct LDL: 63 mg/dL

## 2019-05-07 LAB — PAIN MGMT, PROFILE 8 W/CONF, U
6 Acetylmorphine: NEGATIVE ng/mL
Alcohol Metabolites: NEGATIVE ng/mL (ref <500)
Alphahydroxyalprazolam: NEGATIVE ng/mL
Alphahydroxymidazolam: NEGATIVE ng/mL
Alphahydroxytriazolam: NEGATIVE ng/mL
Aminoclonazepam: NEGATIVE ng/mL
Amphetamines: NEGATIVE ng/mL
Benzodiazepines: POSITIVE ng/mL
Buprenorphine, Urine: NEGATIVE ng/mL
Cocaine Metabolite: NEGATIVE ng/mL
Codeine: NEGATIVE ng/mL
Creatinine: 83.4 mg/dL
Hydrocodone: 576 ng/mL
Hydromorphone: 1160 ng/mL
Hydroxyethylflurazepam: NEGATIVE ng/mL
Lorazepam: 415 ng/mL
MDMA: NEGATIVE ng/mL
Marijuana Metabolite: 34 ng/mL
Marijuana Metabolite: POSITIVE ng/mL
Morphine: NEGATIVE ng/mL
Nordiazepam: NEGATIVE ng/mL
Norhydrocodone: 1692 ng/mL
Opiates: POSITIVE ng/mL
Oxazepam: NEGATIVE ng/mL
Oxidant: NEGATIVE ug/mL
Oxycodone: NEGATIVE ng/mL
Temazepam: NEGATIVE ng/mL
pH: 6.7 (ref 4.5–9.0)

## 2019-05-12 ENCOUNTER — Encounter: Payer: Self-pay | Admitting: Family Medicine

## 2019-05-12 DIAGNOSIS — G893 Neoplasm related pain (acute) (chronic): Secondary | ICD-10-CM

## 2019-05-12 DIAGNOSIS — C641 Malignant neoplasm of right kidney, except renal pelvis: Secondary | ICD-10-CM

## 2019-05-12 MED ORDER — HYDROCODONE-ACETAMINOPHEN 7.5-325 MG PO TABS: ORAL_TABLET | ORAL | 0 refills | Status: DC

## 2019-05-26 ENCOUNTER — Encounter: Payer: Self-pay | Admitting: Family Medicine

## 2019-05-26 DIAGNOSIS — C641 Malignant neoplasm of right kidney, except renal pelvis: Secondary | ICD-10-CM

## 2019-05-26 DIAGNOSIS — G893 Neoplasm related pain (acute) (chronic): Secondary | ICD-10-CM

## 2019-05-29 MED ORDER — HYDROCODONE-ACETAMINOPHEN 7.5-325 MG PO TABS: ORAL_TABLET | ORAL | 0 refills | Status: DC

## 2019-05-29 NOTE — Addendum Note (Signed)
Addended by: Lamar Blinks C on: 05/29/2019 05:39 AM   Modules accepted: Orders

## 2019-06-02 DIAGNOSIS — C649 Malignant neoplasm of unspecified kidney, except renal pelvis: Secondary | ICD-10-CM | POA: Diagnosis not present

## 2019-06-02 DIAGNOSIS — C7951 Secondary malignant neoplasm of bone: Secondary | ICD-10-CM | POA: Diagnosis not present

## 2019-06-02 DIAGNOSIS — R809 Proteinuria, unspecified: Secondary | ICD-10-CM | POA: Diagnosis not present

## 2019-06-02 DIAGNOSIS — T451X5A Adverse effect of antineoplastic and immunosuppressive drugs, initial encounter: Secondary | ICD-10-CM | POA: Diagnosis not present

## 2019-06-02 DIAGNOSIS — Y92538 Other ambulatory health services establishments as the place of occurrence of the external cause: Secondary | ICD-10-CM | POA: Diagnosis not present

## 2019-06-02 DIAGNOSIS — D580 Hereditary spherocytosis: Secondary | ICD-10-CM | POA: Diagnosis not present

## 2019-06-02 DIAGNOSIS — Z79899 Other long term (current) drug therapy: Secondary | ICD-10-CM | POA: Diagnosis not present

## 2019-06-05 ENCOUNTER — Other Ambulatory Visit: Payer: Self-pay

## 2019-06-05 ENCOUNTER — Encounter: Payer: Self-pay | Admitting: Family Medicine

## 2019-06-05 DIAGNOSIS — R110 Nausea: Secondary | ICD-10-CM

## 2019-06-05 DIAGNOSIS — F329 Major depressive disorder, single episode, unspecified: Secondary | ICD-10-CM

## 2019-06-05 MED ORDER — ONDANSETRON 4 MG PO TBDP: ORAL_TABLET | ORAL | 2 refills | Status: DC

## 2019-06-06 DIAGNOSIS — C649 Malignant neoplasm of unspecified kidney, except renal pelvis: Secondary | ICD-10-CM | POA: Diagnosis not present

## 2019-06-09 MED ORDER — LORAZEPAM 1 MG PO TABS: 0.5000 mg | ORAL_TABLET | Freq: Every evening | ORAL | 5 refills | Status: DC | PRN

## 2019-07-06 ENCOUNTER — Encounter: Payer: Self-pay | Admitting: Family Medicine

## 2019-07-06 DIAGNOSIS — C641 Malignant neoplasm of right kidney, except renal pelvis: Secondary | ICD-10-CM

## 2019-07-06 DIAGNOSIS — G893 Neoplasm related pain (acute) (chronic): Secondary | ICD-10-CM

## 2019-07-07 MED ORDER — HYDROCODONE-ACETAMINOPHEN 7.5-325 MG PO TABS: ORAL_TABLET | ORAL | 0 refills | Status: DC

## 2019-07-08 MED ORDER — HYDROCODONE-ACETAMINOPHEN 7.5-325 MG PO TABS: ORAL_TABLET | ORAL | 0 refills | Status: DC

## 2019-07-08 NOTE — Addendum Note (Signed)
Addended by: Lamar Blinks C on: 07/08/2019 06:25 AM   Modules accepted: Orders

## 2019-07-14 DIAGNOSIS — I129 Hypertensive chronic kidney disease with stage 1 through stage 4 chronic kidney disease, or unspecified chronic kidney disease: Secondary | ICD-10-CM | POA: Diagnosis not present

## 2019-07-14 DIAGNOSIS — Z1509 Genetic susceptibility to other malignant neoplasm: Secondary | ICD-10-CM | POA: Diagnosis not present

## 2019-07-14 DIAGNOSIS — R809 Proteinuria, unspecified: Secondary | ICD-10-CM | POA: Diagnosis not present

## 2019-07-14 DIAGNOSIS — N182 Chronic kidney disease, stage 2 (mild): Secondary | ICD-10-CM | POA: Diagnosis not present

## 2019-07-17 ENCOUNTER — Other Ambulatory Visit: Payer: Self-pay | Admitting: Family Medicine

## 2019-07-17 DIAGNOSIS — E782 Mixed hyperlipidemia: Secondary | ICD-10-CM

## 2019-08-04 DIAGNOSIS — M19012 Primary osteoarthritis, left shoulder: Secondary | ICD-10-CM | POA: Diagnosis not present

## 2019-08-05 ENCOUNTER — Encounter: Payer: Self-pay | Admitting: Family Medicine

## 2019-08-05 DIAGNOSIS — C641 Malignant neoplasm of right kidney, except renal pelvis: Secondary | ICD-10-CM

## 2019-08-05 DIAGNOSIS — G893 Neoplasm related pain (acute) (chronic): Secondary | ICD-10-CM

## 2019-08-06 MED ORDER — HYDROCODONE-ACETAMINOPHEN 7.5-325 MG PO TABS: ORAL_TABLET | ORAL | 0 refills | Status: DC

## 2019-08-21 DIAGNOSIS — R69 Illness, unspecified: Secondary | ICD-10-CM | POA: Diagnosis not present

## 2019-09-02 DIAGNOSIS — C649 Malignant neoplasm of unspecified kidney, except renal pelvis: Secondary | ICD-10-CM | POA: Diagnosis not present

## 2019-09-02 DIAGNOSIS — C7951 Secondary malignant neoplasm of bone: Secondary | ICD-10-CM | POA: Diagnosis not present

## 2019-09-02 DIAGNOSIS — Z79899 Other long term (current) drug therapy: Secondary | ICD-10-CM | POA: Diagnosis not present

## 2019-09-02 DIAGNOSIS — Z1509 Genetic susceptibility to other malignant neoplasm: Secondary | ICD-10-CM | POA: Diagnosis not present

## 2019-09-02 DIAGNOSIS — R809 Proteinuria, unspecified: Secondary | ICD-10-CM | POA: Diagnosis not present

## 2019-09-02 DIAGNOSIS — K469 Unspecified abdominal hernia without obstruction or gangrene: Secondary | ICD-10-CM | POA: Diagnosis not present

## 2019-09-03 ENCOUNTER — Encounter: Payer: Self-pay | Admitting: Family Medicine

## 2019-09-03 DIAGNOSIS — C641 Malignant neoplasm of right kidney, except renal pelvis: Secondary | ICD-10-CM

## 2019-09-03 DIAGNOSIS — G893 Neoplasm related pain (acute) (chronic): Secondary | ICD-10-CM

## 2019-09-03 MED ORDER — HYDROCODONE-ACETAMINOPHEN 7.5-325 MG PO TABS: ORAL_TABLET | ORAL | 0 refills | Status: DC

## 2019-09-05 ENCOUNTER — Other Ambulatory Visit: Payer: Self-pay

## 2019-09-06 ENCOUNTER — Other Ambulatory Visit: Payer: Self-pay | Admitting: Family Medicine

## 2019-09-06 DIAGNOSIS — I10 Essential (primary) hypertension: Secondary | ICD-10-CM

## 2019-09-07 NOTE — Progress Notes (Signed)
[place] at [facility] [address] [place], [place] [address] [phone]  Date:  09/08/2019   Name:  Douglas [name]   DOB:  [birth year]   MRN:  [id number]  PCP:  Douglas Mclean, MD    Chief Complaint: Hernia   History of Present Illness:  Douglas [name] is a 64 y.o. very pleasant male patient who presents with the following:  Patient with a complex medical history, his metastatic cancer is being treated in a [facility] at [facility] He has hereditary leiomyomatosis and renal cell cancer-current treatments include avastin, tarceva with denosumab for bone mets.  He has missed some doses of Avastin due to proteinuria over the last year He does see an oncologist at [facility] as well  He recently contacted me with concern of a possible hernia in his left lower abdominal wall-he was not sure if he should consider surgery for this, or just leave it alone due to his other health problems.  He is nervous about having to interrupt his [facility] trial to undergo surgery He has noted this possible hernia for about 5 months It is only sometimes symptomatic- it might bother him every 4 days or so, it can be painful for several hours or even a day, then will feel fine for several days Lifting makes it worse  He had a right sided inguinal hernia repaired in 2004- this has done great and not bothered him  Here today with his wife Douglas [name]  He notes that he is overall doing well, though he sometimes gets tired of his treatments and very frequent trips to the [facility]. Patient Active Problem List   Diagnosis Date Noted  . Bone metastases ([place]) 07/26/2016  . CAD (coronary artery disease) 03/25/2014  . Protein-calorie malnutrition, severe ([place]) 10/17/2013  . 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 Renal calculi   . GERD (gastroesophageal reflux disease)   . Allergic rhinitis   . Osteoarthritis   .  Encounter for long-term (current) use of other medications   . Hyperlipidemia   . Tobacco use disorder   . COPD (chronic obstructive pulmonary disease) ([place])   . Unspecified vitamin D deficiency   . Hypertension     Past Medical History:  Diagnosis Date  . Allergic rhinitis   . Arthritis    "all over my body" (10/16/2013)  . Chronic back pain    "neck to lower back" (10/16/2013)  . Compression fracture    S/P L-SPINE; pt does not recall this hx on 10/16/2013  . COPD (chronic obstructive pulmonary disease) ([facility])    FVC .66  . Depression    "situational since 09/22/2013 OR"  . Encounter for long-term (current) use of other medications   . GERD (gastroesophageal reflux disease)   . Hemoptysis   . 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 History of blood transfusion 09/2013   "think so; not sure; related to big OR"  . History of surgical fusion joint    DDD C-SPINE  . Hyperlipidemia   . Hypertension   . Leiomyoma OF SKIN   INCREASED RISK RENAL CELL CA  NEEDS CT OF KIDNEYS EVERY 2 YEARS NEXT DUE  02/06/13  . Melanoma in situ ([place])   . Melanoma of back ([place])   . Neuromuscular disorder ([place])   . Osteoarthritis   . Renal calculi   . Renal cell carcinoma ([place])    type  2; papillary  . Tobacco use disorder   . Unspecified vitamin D deficiency     Past Surgical History:  Procedure Laterality Date  . ANTERIOR CERVICAL DECOMP/DISCECTOMY FUSION  1992  . ANTERIOR CRUCIATE LIGAMENT REPAIR Bilateral [phone]  . APPENDECTOMY  09/22/2013  . CHOLECYSTECTOMY  09/22/2013   "had tumors in it"  . HAND LIGAMENT RECONSTRUCTION Right   . HARVEST BONE GRAFT Right 1992   "hip; for neck fusion"  . INGUINAL HERNIA REPAIR Right 2004  . LIGAMENT REPAIR Right ~ 2001   "little finger"  . LYMPH NODE DISSECTION Right 09/22/2013   "in the area of the kidney; they were clean"  . MELANOMA EXCISION  ~ 2007   "off my back"  . PARTIAL NEPHRECTOMY Right 09/22/2013  . removal  of fascia overlying the right psoas Right 09/22/2013  . RENAL ARTERY STENT Right 09/22/2013  . RIGHT COLECTOMY Right 09/22/2013  . ROBOTIC ASSITED PARTIAL NEPHRECTOMY  05/22/2012   Procedure: ROBOTIC ASSITED PARTIAL NEPHRECTOMY;  Surgeon: Alexis Frock, MD;  Location: [facility];  Service: Urology;  Laterality: Right;  Right Robotic Cyst Decortication and Partial Nephrectomy   . SPINE SURGERY      Social History   Tobacco Use  . Smoking status: Former Smoker    Packs/day: 0.50    Years: 40.00    Pack years: 20.00    Types: Cigarettes  . Smokeless tobacco: Never Used  . Tobacco comment: Pt chewing Nicorette gum currently  Substance Use Topics  . Alcohol use: Yes    Alcohol/week: 0.0 standard drinks    Comment: 10/16/2013 "2-3 beers daily generally; nothing last month or so"  . Drug use: No    Family History  Adopted: Yes  Problem Relation Age of Onset  . Hypertension Father     Allergies  Allergen Reactions  . Avelox [Moxifloxacin Hcl In Nacl] Hives, Shortness Of Breath and Swelling  . Medical Adhesive Remover Dermatitis and Rash  . Tape Dermatitis and Rash    Medication list has been reviewed and updated.  Current Outpatient Medications on File Prior to Visit  Medication Sig Dispense Refill  . atorvastatin (LIPITOR) 10 MG tablet TAKE 1 TABLET BY MOUTH EVERYDAY AT BEDTIME 90 tablet 3  . B Complex Vitamins (B COMPLEX 100 PO) Take 100 mg by mouth daily.    . Bevacizumab (AVASTIN) 100 MG/4ML SOLN Inject into the vein.    . cetirizine (ZYRTEC) 10 MG tablet Take 5 mg by mouth daily.     Marland Kitchen denosumab (XGEVA) 120 MG/1.7ML SOLN injection Inject 120 mg into the skin every 30 (thirty) days.     Marland Kitchen doxycycline (ADOXA) 100 MG tablet Take 100 mg by mouth 2 (two) times daily.    . DULoxetine (CYMBALTA) 20 MG capsule Take 1 capsule (20 mg total) by mouth daily. 90 capsule 3  . erlotinib (TARCEVA) 150 MG tablet Take 150 mg by mouth daily. Take on an empty stomach 1 hour before meals or 2 hours  after    . HYDROcodone-acetaminophen (NORCO) 7.5-325 MG tablet Take 1-2 tablets every 4-6 hours as needed for pain. 240 tablet 0  . HYDROcodone-acetaminophen (NORCO) 7.5-325 MG tablet Take 1-2 tablets every 4-6 hours as needed for pain. Till fill 09/07/19 240 tablet 0  . lidocaine (LIDODERM) 5 % Place 1 patch onto the skin daily. Remove & Discard patch within 12 hours or as directed by MD.  May wear up to 3 patches at a time 60 patch 6  .  loperamide (IMODIUM A-D) 2 MG tablet Take 2 mg by mouth 4 (four) times daily as needed for diarrhea or loose stools.    Marland Kitchen LORazepam (ATIVAN) 1 MG tablet Take 0.5-1 tablets (0.5-1 mg total) by mouth at bedtime as needed for sleep. TAKE ONE HALF OR ONE TABLET BY MOUTH AT BEDTIME IF NEEDED FOR SLEEP, DO NOT TAKE WITH PAIN MEDS 30 tablet 5  . losartan (COZAAR) 50 MG tablet Take 1 tablet (50 mg total) by mouth daily. TAKE 1 BY MOUTH DAILY BEFORE BREAKFAST 90 tablet 3  . Multiple Vitamins-Minerals (CENTRUM SILVER 50+MEN PO) Take 1 tablet by mouth daily.    Marland Kitchen NIFEdipine (ADALAT CC) 30 MG 24 hr tablet Take 1 tablet (30 mg total) by mouth daily. Can increase to 60mg  as needed 60 tablet 5  . omega-3 acid ethyl esters (LOVAZA) 1 g capsule Take 1 capsule (1 g total) by mouth 2 (two) times daily. (Patient taking differently: Take 1 g by mouth daily. ) 180 capsule 3  . ondansetron (ZOFRAN-ODT) 4 MG disintegrating tablet TAKE 1 TABLET BY MOUTH EVERY 8 HOURS AS NEEDED FOR NAUSEA/VOMITING 40 tablet 2  . pantoprazole (PROTONIX) 20 MG tablet Take 20 mg by mouth daily.    Marland Kitchen UNABLE TO FIND Take 1 tablet by mouth daily. Med Name: Calcium 600/D-3 300 2 daily     . vitamin C (ASCORBIC ACID) 500 MG tablet Take 500 mg by mouth daily.     No current facility-administered medications on file prior to visit.    Review of Systems:  As per HPI- otherwise negative.   Physical Examination: Vitals:   09/08/19 1256  BP: 113/72  Pulse: 82  Resp: 16  Temp: 97.7 F (36.5 C)  SpO2: 96%    Vitals:   09/08/19 1256  Weight: 165 lb (74.8 kg)  Height: 6\' 1"  (1.854 m)   Body mass index is 21.77 kg/m. Ideal Body Weight: Weight in (lb) to have BMI = 25: 189.1  GEN: no acute distress.  Weight is stable, looks his normal self-chronically ill but nontoxic HEENT: Atraumatic, Normocephalic.  Ears and Nose: No external deformity. CV: RRR, No M/G/R. No JVD. No thrill. No extra heart sounds. PULM: CTA B, no wheezes, crackles, rhonchi. No retractions. No resp. distress. No accessory muscle use. ABD: S, NT, ND, +BS. No rebound. No HSM.  Belly is benign, he does have an incisional hernia to the right of the umbilicus EXTR: No c/c/e PSYCH: Normally interactive. Conversant.  No definite inguinal hernias palpated at this time.  No bulge is noted in the left lower quadrant at rest or with Valsalva, no defect is felt in the inguinal canal Normal testes and penis   Assessment and Plan: Left inguinal hernia - Plan: Ambulatory referral to General Surgery  Here today with concern of a left inguinal hernia.  No definite hernia is palpated today, but the patient describes symptoms that sound very suspicious for hernia.  His situation is complicated by his current cancer treatment-if the repair is not absolutely necessary he would most likely prefer to observe I cautioned him about signs and symptoms of a strangulated hernia, he will seek immediate care if any persistent or severe pain Moderate medical decision making today This visit occurred during the SARS-CoV-2 public health emergency.  Safety protocols were in place, including screening questions prior to the visit, additional usage of staff PPE, and extensive cleaning of exam room while observing appropriate contact time as indicated for disinfecting solutions.  Signed Lamar Blinks, MD

## 2019-09-07 NOTE — Patient Instructions (Addendum)
Great to see you again today, as always We will get you set up to see general surgery to look at your hernia area.  In the meantime take it easy on this area

## 2019-09-08 ENCOUNTER — Other Ambulatory Visit: Payer: Self-pay

## 2019-09-08 ENCOUNTER — Ambulatory Visit (INDEPENDENT_AMBULATORY_CARE_PROVIDER_SITE_OTHER): Payer: Medicare HMO | Admitting: Family Medicine

## 2019-09-08 ENCOUNTER — Encounter: Payer: Self-pay | Admitting: Family Medicine

## 2019-09-08 VITALS — BP 113/72 | HR 82 | Temp 97.7°F | Resp 16 | Ht 73.0 in | Wt 165.0 lb

## 2019-09-08 DIAGNOSIS — K4090 Unilateral inguinal hernia, without obstruction or gangrene, not specified as recurrent: Secondary | ICD-10-CM

## 2019-09-30 ENCOUNTER — Encounter: Payer: Self-pay | Admitting: Family Medicine

## 2019-09-30 DIAGNOSIS — G893 Neoplasm related pain (acute) (chronic): Secondary | ICD-10-CM

## 2019-09-30 DIAGNOSIS — C641 Malignant neoplasm of right kidney, except renal pelvis: Secondary | ICD-10-CM

## 2019-09-30 MED ORDER — HYDROCODONE-ACETAMINOPHEN 7.5-325 MG PO TABS: ORAL_TABLET | ORAL | 0 refills | Status: DC

## 2019-10-02 DIAGNOSIS — I129 Hypertensive chronic kidney disease with stage 1 through stage 4 chronic kidney disease, or unspecified chronic kidney disease: Secondary | ICD-10-CM | POA: Diagnosis not present

## 2019-10-02 DIAGNOSIS — R809 Proteinuria, unspecified: Secondary | ICD-10-CM | POA: Diagnosis not present

## 2019-10-02 DIAGNOSIS — Z1509 Genetic susceptibility to other malignant neoplasm: Secondary | ICD-10-CM | POA: Diagnosis not present

## 2019-10-02 DIAGNOSIS — N182 Chronic kidney disease, stage 2 (mild): Secondary | ICD-10-CM | POA: Diagnosis not present

## 2019-10-10 ENCOUNTER — Encounter: Payer: Self-pay | Admitting: Family Medicine

## 2019-10-10 ENCOUNTER — Other Ambulatory Visit: Payer: Self-pay | Admitting: Family Medicine

## 2019-10-10 DIAGNOSIS — R110 Nausea: Secondary | ICD-10-CM

## 2019-10-10 NOTE — Telephone Encounter (Signed)
Zofran refilled by Jarrett Soho garner, cma

## 2019-10-13 DIAGNOSIS — R69 Illness, unspecified: Secondary | ICD-10-CM | POA: Diagnosis not present

## 2019-10-14 ENCOUNTER — Other Ambulatory Visit: Payer: Self-pay | Admitting: Family Medicine

## 2019-10-16 DIAGNOSIS — K432 Incisional hernia without obstruction or gangrene: Secondary | ICD-10-CM | POA: Diagnosis not present

## 2019-10-16 DIAGNOSIS — C7951 Secondary malignant neoplasm of bone: Secondary | ICD-10-CM | POA: Diagnosis not present

## 2019-10-16 DIAGNOSIS — Z1509 Genetic susceptibility to other malignant neoplasm: Secondary | ICD-10-CM | POA: Diagnosis not present

## 2019-10-16 DIAGNOSIS — K4090 Unilateral inguinal hernia, without obstruction or gangrene, not specified as recurrent: Secondary | ICD-10-CM | POA: Diagnosis not present

## 2019-10-28 ENCOUNTER — Encounter: Payer: Self-pay | Admitting: Family Medicine

## 2019-10-28 DIAGNOSIS — G893 Neoplasm related pain (acute) (chronic): Secondary | ICD-10-CM

## 2019-10-28 DIAGNOSIS — C641 Malignant neoplasm of right kidney, except renal pelvis: Secondary | ICD-10-CM

## 2019-10-28 MED ORDER — HYDROCODONE-ACETAMINOPHEN 7.5-325 MG PO TABS: ORAL_TABLET | ORAL | 0 refills | Status: DC

## 2019-10-30 ENCOUNTER — Other Ambulatory Visit: Payer: Self-pay | Admitting: Family Medicine

## 2019-10-30 DIAGNOSIS — G893 Neoplasm related pain (acute) (chronic): Secondary | ICD-10-CM

## 2019-10-30 DIAGNOSIS — F329 Major depressive disorder, single episode, unspecified: Secondary | ICD-10-CM

## 2019-11-10 DIAGNOSIS — R69 Illness, unspecified: Secondary | ICD-10-CM | POA: Diagnosis not present

## 2019-11-13 ENCOUNTER — Encounter: Payer: Self-pay | Admitting: Family Medicine

## 2019-11-24 DIAGNOSIS — M19012 Primary osteoarthritis, left shoulder: Secondary | ICD-10-CM | POA: Diagnosis not present

## 2019-11-26 ENCOUNTER — Encounter: Payer: Self-pay | Admitting: Family Medicine

## 2019-11-26 DIAGNOSIS — G893 Neoplasm related pain (acute) (chronic): Secondary | ICD-10-CM

## 2019-11-26 DIAGNOSIS — C641 Malignant neoplasm of right kidney, except renal pelvis: Secondary | ICD-10-CM

## 2019-11-26 MED ORDER — HYDROCODONE-ACETAMINOPHEN 7.5-325 MG PO TABS: ORAL_TABLET | ORAL | 0 refills | Status: DC

## 2019-12-02 LAB — BASIC METABOLIC PANEL
BUN: 27 — AB (ref 4–21)
CO2: 20 (ref 13–22)
Chloride: 108 (ref 99–108)
Creatinine: 1.3 (ref 0.6–1.3)
Glucose: 78
Potassium: 4.2 (ref 3.4–5.3)
Sodium: 138 (ref 137–147)

## 2019-12-02 LAB — HEPATIC FUNCTION PANEL
ALT: 28 (ref 10–40)
AST: 27 (ref 14–40)
Alkaline Phosphatase: 40 (ref 25–125)
Bilirubin, Direct: 0.2 (ref 0.01–0.4)
Bilirubin, Total: 0.4

## 2019-12-02 LAB — CBC AND DIFFERENTIAL
HCT: 45 (ref 41–53)
Hemoglobin: 15.1 (ref 13.5–17.5)
Platelets: 217 (ref 150–399)
WBC: 11.1

## 2019-12-02 LAB — COMPREHENSIVE METABOLIC PANEL
Albumin: 3.7 (ref 3.5–5.0)
Calcium: 8.7 (ref 8.7–10.7)
GFR calc non Af Amer: 56

## 2019-12-10 ENCOUNTER — Encounter: Payer: Self-pay | Admitting: Family Medicine

## 2019-12-10 DIAGNOSIS — F329 Major depressive disorder, single episode, unspecified: Secondary | ICD-10-CM

## 2019-12-10 MED ORDER — LORAZEPAM 1 MG PO TABS: 0.5000 mg | ORAL_TABLET | Freq: Every evening | ORAL | 5 refills | Status: DC | PRN

## 2019-12-24 ENCOUNTER — Encounter: Payer: Self-pay | Admitting: Family Medicine

## 2019-12-24 DIAGNOSIS — G893 Neoplasm related pain (acute) (chronic): Secondary | ICD-10-CM

## 2019-12-24 DIAGNOSIS — C641 Malignant neoplasm of right kidney, except renal pelvis: Secondary | ICD-10-CM

## 2019-12-24 MED ORDER — HYDROCODONE-ACETAMINOPHEN 7.5-325 MG PO TABS: ORAL_TABLET | ORAL | 0 refills | Status: DC

## 2019-12-26 ENCOUNTER — Encounter: Payer: Self-pay | Admitting: Family Medicine

## 2019-12-26 DIAGNOSIS — C649 Malignant neoplasm of unspecified kidney, except renal pelvis: Secondary | ICD-10-CM | POA: Diagnosis not present

## 2019-12-26 DIAGNOSIS — R809 Proteinuria, unspecified: Secondary | ICD-10-CM | POA: Diagnosis not present

## 2019-12-26 DIAGNOSIS — Z79899 Other long term (current) drug therapy: Secondary | ICD-10-CM | POA: Diagnosis not present

## 2019-12-26 DIAGNOSIS — K4090 Unilateral inguinal hernia, without obstruction or gangrene, not specified as recurrent: Secondary | ICD-10-CM | POA: Diagnosis not present

## 2019-12-26 DIAGNOSIS — C7951 Secondary malignant neoplasm of bone: Secondary | ICD-10-CM | POA: Diagnosis not present

## 2019-12-26 DIAGNOSIS — Z1509 Genetic susceptibility to other malignant neoplasm: Secondary | ICD-10-CM | POA: Diagnosis not present

## 2019-12-26 LAB — PHOSPHORUS: Phosphorus: 3.7

## 2019-12-26 LAB — BMP8+ANION GAP: Anion gap: 10

## 2019-12-26 LAB — MAGNESIUM: Magnesium: 1.9

## 2020-01-21 ENCOUNTER — Encounter: Payer: Self-pay | Admitting: Family Medicine

## 2020-01-21 DIAGNOSIS — C641 Malignant neoplasm of right kidney, except renal pelvis: Secondary | ICD-10-CM

## 2020-01-21 DIAGNOSIS — G893 Neoplasm related pain (acute) (chronic): Secondary | ICD-10-CM

## 2020-01-21 MED ORDER — HYDROCODONE-ACETAMINOPHEN 7.5-325 MG PO TABS: ORAL_TABLET | ORAL | 0 refills | Status: DC

## 2020-01-26 LAB — CBC AND DIFFERENTIAL
HCT: 43 (ref 41–53)
Platelets: 244 (ref 150–399)
WBC: 8.9

## 2020-01-26 LAB — COMPREHENSIVE METABOLIC PANEL
Albumin: 3.7 (ref 3.5–5.0)
Calcium: 8.9 (ref 8.7–10.7)
GFR calc non Af Amer: 63

## 2020-01-26 LAB — BASIC METABOLIC PANEL
CO2: 22 (ref 13–22)
Chloride: 106 (ref 99–108)
Creatinine: 1.9 — AB (ref <=1.3)
Glucose: 120
Sodium: 136 — AB (ref 137–147)

## 2020-01-26 LAB — HEPATIC FUNCTION PANEL
ALT: 21 (ref 10–40)
AST: 24 (ref 14–40)
Alkaline Phosphatase: 44 (ref 25–125)

## 2020-02-02 DIAGNOSIS — N182 Chronic kidney disease, stage 2 (mild): Secondary | ICD-10-CM | POA: Diagnosis not present

## 2020-02-02 DIAGNOSIS — R809 Proteinuria, unspecified: Secondary | ICD-10-CM | POA: Diagnosis not present

## 2020-02-02 DIAGNOSIS — R69 Illness, unspecified: Secondary | ICD-10-CM | POA: Diagnosis not present

## 2020-02-02 DIAGNOSIS — Z1509 Genetic susceptibility to other malignant neoplasm: Secondary | ICD-10-CM | POA: Diagnosis not present

## 2020-02-02 DIAGNOSIS — I129 Hypertensive chronic kidney disease with stage 1 through stage 4 chronic kidney disease, or unspecified chronic kidney disease: Secondary | ICD-10-CM | POA: Diagnosis not present

## 2020-02-15 NOTE — Progress Notes (Signed)
[place] at [facility] [address] [place], [place] [address] [phone]  Date:  02/16/2020   Name:  Douglas [name]   DOB:  [birth year]   MRN:  [id number]  PCP:  Darreld Mclean, MD    Chief Complaint: Discuss Scans   History of Present Illness:  Douglas [name] is a 64 y.o. very pleasant male patient who presents with the following:  Douglas [name]  has hereditary leiomyomatosis and renal cell cancer-current treatments include avastin, tarceva with denosumab for bone mets He is being treated in a clinical trial at [facility] for this metastatic cancer, he also does see an oncologist at [facility] Patient here today for a follow-up visit Last seen by myself in March of this year-at that time there was concern inguinal hernia.  This is being monitored at this time, we decided to delay surgical repair due to his cancer treatment  He did restage scans at [place] 2 weeks ago- they have some possible concerns about vascular changes in the chest.  I reviewed CT chest/abdomen/pelvis dated 01/27/2020 The main new issue of concern is "coronary artery and vascular atherosclerotic disease resulting in significant narrowing of the left subclavian artery, superior mesenteric artery, right renal artery and right internal iliac artery"  Also reviewed lab work from 01/26/2020, will abstract into chart  Pt has seen cardiology- Johnsie Cancel- several years ago  Pt is taking Lipitor still  He is walking 1 mile each day without any chest pain He tries to do some light weight lifting as well to keep up his strength   Douglas [name]'s other main concern today is that he feels a motivated, does not have a lot of energy or motivation to do things.  He thinks he probably is depressed His wife [name] is generally in good physical health but does have some memory issues, he worries what will happen to her when he passes away He took his boat out fishing twice this summer-  previously he might go out at least once a week   Per recent [facility] oncology follow-up note: Assessment and Plan:  Hereditary leiomyomatosis and renal cell cancer currently just on Tarceva. Periodically hold Avastin because of proteinuria. Continue to monitor. Denosumab for bone mets High-risk medication reviewed potential toxicities of therapy has been receiving he has a very good understanding of it  Proteinuria most likely from Avastin. Continue to monitor. We advised him to be sure he consumes enough protein; he expressed understanding.   Needs a molar extraction. He has been prescribed ampicillin for this if symptoms arise. He can hold Avastin and Xgeva in order to have the extraction done. Advised him to have this done soon because of risk of infection.   Left inguinal hernia - First noted in 08/2019. At his recent follow-up with the [facility] in 10/2019, it was determined that this hernia did not require surgical repair. Fortunately, his abdominal pain has improved since we last saw him. His appetite has also improved since 10/2019, and he is actively trying to gain weight. We will continue to monitor.  He also has significant left shoulder arthritis, would be a candidate for shoulder replacement except that would require interruption of his cancer treatment.  Dr. Mardelle Matte is treating him for this issue, most recently gave him a Depo-Medrol injection in June of this year  Colon cancer screening- pt declines for now as he has so many other health problems Covid vaccine- this is done, he did  his series in April -we discussed Mr. He is planning to see endodontist for a tooth issue soon  I am treating his chronic pain due to bone metastasis and also for anxiety as follows UDS last done in November He takes doxy 50 mg daily   He notes that his bilateral big toes feel numb/tingly, "like a block of wood" No claudiacation symptoms with exercise  02/06/2020  2   12/10/2019  Lorazepam 1 MG Tablet   30.00  30 Je Cop   [id number]   [place] ([id number])   2/5  1.00 LME  Medicare   [place]  01/24/2020  1   01/21/2020  Hydrocodone-Acetamin 7.5-325  240.00  20 Je Cop   [id number]   [facility] (2191)   0/0  90.00 MME  Comm Ins   [place]  01/07/2020  2   12/10/2019  Lorazepam 1 MG Tablet  30.00  30 Je Cop   [id number]   [place] (5429)   1/5  1.00 LME  Medicare   [place]  12/28/2019  1   12/24/2019  Hydrocodone-Acetamin 7.5-325  240.00  20 Je Cop   [id number]   [facility] (2191)   0/0  90.00 MME  Comm Ins   [place]  12/10/2019  2   12/10/2019  Lorazepam 1 MG Tablet  30.00  30 Je Cop   [id number]   [place] (5429)   0/5  1.00 LME  Medicare   [place]  11/26/2019  1   11/26/2019  Hydrocodone-Acetamin 7.5-325  240.00  20 Je Cop   [id number]   [facility] (2191)   0/0  90.00 MME  Comm Ins   [place]  11/08/2019  2   06/09/2019  Lorazepam 1 MG Tablet  30.00  30 Je Cop   [id number]   [place] (5429)   5/5  1.00 LME  Medicare   [place]  11/01/2019  1   10/28/2019  Hydrocodone-Acetamin 7.5-325  240.00  20 Je Cop   [id number]   [facility] (2191)   0/0  90.00 MME  Comm Ins   [place]  10/06/2019  2   06/09/2019  Lorazepam 1 MG Tablet  30.00  30 Je Cop   [id number]   [place] (5429)   4/5  1.00 LME  Medicare   [place]  10/05/2019  1   09/30/2019  Hydrocodone-Acetamin 7.5-325  240.00  20 Je Cop   [id number]   [facility] (2191)   0/0  90.00 MME  Comm Ins   [place]  09/07/2019  1   09/03/2019  Hydrocodone-Acetamin 7.5-325  240.00  20 Je Cop   [id number]   [facility] (2191)   0/0  90.00 MME        Patient Active Problem List   Diagnosis Date Noted  . Bone metastases ([place]) 07/26/2016  . CAD (coronary artery disease) 03/25/2014  . Protein-calorie malnutrition, severe ([place]) 10/17/2013  . 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 Renal calculi   . GERD (gastroesophageal reflux disease)   . Allergic rhinitis   . Osteoarthritis   . Encounter for long-term (current) use of other medications   . Hyperlipidemia   . Tobacco use disorder   . COPD (chronic obstructive pulmonary disease) ([place])   . Unspecified vitamin D deficiency   .  Hypertension     Past Medical History:  Diagnosis Date  . Allergic rhinitis   . Arthritis    "all over my body" (10/16/2013)  . Chronic back pain    "neck to lower back" (10/16/2013)  .  Compression fracture    S/P L-SPINE; pt does not recall this hx on 10/16/2013  . COPD (chronic obstructive pulmonary disease) (HCC)    FVC .66  . Depression    "situational since 09/22/2013 OR"  . Encounter for long-term (current) use of other medications   . GERD (gastroesophageal reflux disease)   . Hemoptysis   . 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 History of blood transfusion 09/2013   "think so; not sure; related to big OR"  . History of surgical fusion joint    DDD C-SPINE  . Hyperlipidemia   . Hypertension   . Leiomyoma OF SKIN   INCREASED RISK RENAL CELL CA  NEEDS CT OF KIDNEYS EVERY 2 YEARS NEXT DUE  02/06/13  . Melanoma in situ ([place])   . Melanoma of back ([place])   . Neuromuscular disorder ([place])   . Osteoarthritis   . Renal calculi   . Renal cell carcinoma ([place])    type 2; papillary  . Tobacco use disorder   . Unspecified vitamin D deficiency     Past Surgical History:  Procedure Laterality Date  . ANTERIOR CERVICAL DECOMP/DISCECTOMY FUSION  1992  . ANTERIOR CRUCIATE LIGAMENT REPAIR Bilateral [phone]  . APPENDECTOMY  09/22/2013  . CHOLECYSTECTOMY  09/22/2013   "had tumors in it"  . HAND LIGAMENT RECONSTRUCTION Right   . HARVEST BONE GRAFT Right 1992   "hip; for neck fusion"  . INGUINAL HERNIA REPAIR Right 2004  . LIGAMENT REPAIR Right ~ 2001   "little finger"  . LYMPH NODE DISSECTION Right 09/22/2013   "in the area of the kidney; they were clean"  . MELANOMA EXCISION  ~ 2007   "off my back"  . PARTIAL NEPHRECTOMY Right 09/22/2013  . removal of fascia overlying the right psoas Right 09/22/2013  . RENAL ARTERY STENT Right 09/22/2013  . RIGHT COLECTOMY Right 09/22/2013  . ROBOTIC ASSITED PARTIAL NEPHRECTOMY  05/22/2012   Procedure: ROBOTIC ASSITED  PARTIAL NEPHRECTOMY;  Surgeon: Alexis Frock, MD;  Location: [facility];  Service: Urology;  Laterality: Right;  Right Robotic Cyst Decortication and Partial Nephrectomy   . SPINE SURGERY      Social History   Tobacco Use  . Smoking status: Former Smoker    Packs/day: 0.50    Years: 40.00    Pack years: 20.00    Types: Cigarettes  . Smokeless tobacco: Never Used  . Tobacco comment: Pt chewing Nicorette gum currently  Vaping Use  . Vaping Use: Never used  Substance Use Topics  . Alcohol use: Yes    Alcohol/week: 0.0 standard drinks    Comment: 10/16/2013 "2-3 beers daily generally; nothing last month or so"  . Drug use: No    Family History  Adopted: Yes  Problem Relation Age of Onset  . Hypertension Father     Allergies  Allergen Reactions  . Avelox [Moxifloxacin Hcl In Nacl] Hives, Shortness Of Breath and Swelling  . Medical Adhesive Remover Dermatitis and Rash  . Tape Dermatitis and Rash    Medication list has been reviewed and updated.  Current Outpatient Medications on File Prior to Visit  Medication Sig Dispense Refill  . atorvastatin (LIPITOR) 10 MG tablet TAKE 1 TABLET BY MOUTH EVERYDAY AT BEDTIME 90 tablet 3  . B Complex Vitamins (B COMPLEX 100 PO) Take 100 mg by mouth daily.    . Bevacizumab (AVASTIN) 100 MG/4ML SOLN Inject into the vein.    . cetirizine (ZYRTEC) 10 MG  tablet Take 5 mg by mouth daily.     Marland Kitchen denosumab (XGEVA) 120 MG/1.7ML SOLN injection Inject 120 mg into the skin every 30 (thirty) days.     Marland Kitchen doxycycline (ADOXA) 100 MG tablet Take 100 mg by mouth 2 (two) times daily.    . DULoxetine (CYMBALTA) 20 MG capsule TAKE 1 CAPSULE BY MOUTH EVERY DAY 90 capsule 3  . erlotinib (TARCEVA) 150 MG tablet Take 150 mg by mouth daily. Take on an empty stomach 1 hour before meals or 2 hours after    . lidocaine (LIDODERM) 5 % Place 1 patch onto the skin daily. Remove & Discard patch within 12 hours or as directed by MD.  May wear up to 3 patches at a time 60 patch  6  . loperamide (IMODIUM A-D) 2 MG tablet Take 2 mg by mouth 4 (four) times daily as needed for diarrhea or loose stools.    Marland Kitchen LORazepam (ATIVAN) 1 MG tablet Take 0.5-1 tablets (0.5-1 mg total) by mouth at bedtime as needed for sleep. TAKE ONE HALF OR ONE TABLET BY MOUTH AT BEDTIME IF NEEDED FOR SLEEP, DO NOT TAKE WITH PAIN MEDS 30 tablet 5  . losartan (COZAAR) 100 MG tablet Take by mouth.    . Multiple Vitamins-Minerals (CENTRUM SILVER 50+MEN PO) Take 1 tablet by mouth daily.    Marland Kitchen NIFEdipine (ADALAT CC) 90 MG 24 hr tablet Take 90 mg by mouth daily.    . ondansetron (ZOFRAN-ODT) 4 MG disintegrating tablet TAKE 1 TABLET BY MOUTH SUBLINGUALLY EVERY 8 HOURS AS NEEDED FOR NAUSEA OR VOMITING 40 tablet 2  . pantoprazole (PROTONIX) 20 MG tablet Take 20 mg by mouth daily.    Marland Kitchen UNABLE TO FIND Take 1 tablet by mouth daily. Med Name: Calcium 600/D-3 300 2 daily     . vitamin C (ASCORBIC ACID) 500 MG tablet Take 500 mg by mouth daily.     No current facility-administered medications on file prior to visit.    Review of Systems:  As per HPI- otherwise negative. Wt Readings from Last 3 Encounters:  02/16/20 162 lb (73.5 kg)  09/08/19 165 lb (74.8 kg)  03/19/19 160 lb (72.6 kg)     Physical Examination: Vitals:   02/16/20 0950  BP: 126/80  Pulse: 75  Resp: 16  SpO2: 98%   Vitals:   02/16/20 0950  Weight: 162 lb (73.5 kg)  Height: 6\' 1"  (1.854 m)   Body mass index is 21.37 kg/m. Ideal Body Weight: Weight in (lb) to have BMI = 25: 189.1  GEN: no acute distress.  Looks his normal self, weight is stable HEENT: Atraumatic, Normocephalic.  Ears and Nose: No external deformity. CV: RRR, No M/G/R. No JVD. No thrill. No extra heart sounds. PULM: CTA B, no wheezes, crackles, rhonchi. No retractions. No resp. distress. No accessory muscle use. ABD: S, NT, ND, +BS. No rebound. No HSM. EXTR: No c/c/e PSYCH: Normally interactive. Conversant.  Examined feet- Normal pulses in both feet Some  neuropathy in the right great toe, otherwise monofilament testing is normal  Assessment and Plan: Renal cell cancer, right ([place]) - Plan: HYDROcodone-acetaminophen ([place]) 7.5-325 MG tablet  Chronic pain due to neoplasm - Plan: HYDROcodone-acetaminophen (NORCO) 7.5-325 MG tablet  Reactive depression  Essential hypertension  Lack of appetite  Atherosclerotic vascular disease - Plan: Ambulatory referral to Cardiology  Douglas [name] is following up today for a few concerns Unfortunately has a rare metastatic renal cell cancer and hereditary leiomyomatosis He is being treated by  experimental protocol per the [facility] There is concern on recent CT scan about increasing atherosclerotic disease.  I referred him to cardiology to discuss this, suspect we will simply need to continue managing risk factors Blood pressure is under good control Douglas [name] is currently taking Cymbalta for depression.  He notes a lack of motivation.  We have used Wellbutrin before which might help with energy, but there was concern about weight loss.  We also thought about Paxil today.  He plans to discuss his options with his [facility] team at next visit, will let me know if they are comfortable with [place] making a switch Discussed his concerns about his wife Christine-who will take care of her if something happens to himself?  I suggested that they think about moving to a graded assisted living facility which would allow him to live independently for now, but offers services in a higher level of care for later if needed.  They will consider this  This visit occurred during the SARS-CoV-2 public health emergency.  Safety protocols were in place, including screening questions prior to the visit, additional usage of staff PPE, and extensive cleaning of exam room while observing appropriate contact time as indicated for disinfecting solutions.    Signed Lamar Blinks, MD

## 2020-02-16 ENCOUNTER — Ambulatory Visit (INDEPENDENT_AMBULATORY_CARE_PROVIDER_SITE_OTHER): Payer: Medicare HMO | Admitting: Family Medicine

## 2020-02-16 ENCOUNTER — Encounter: Payer: Self-pay | Admitting: Family Medicine

## 2020-02-16 ENCOUNTER — Other Ambulatory Visit: Payer: Self-pay

## 2020-02-16 VITALS — BP 126/80 | HR 75 | Resp 16 | Ht 73.0 in | Wt 162.0 lb

## 2020-02-16 DIAGNOSIS — I7090 Unspecified atherosclerosis: Secondary | ICD-10-CM

## 2020-02-16 DIAGNOSIS — F329 Major depressive disorder, single episode, unspecified: Secondary | ICD-10-CM

## 2020-02-16 DIAGNOSIS — R69 Illness, unspecified: Secondary | ICD-10-CM | POA: Diagnosis not present

## 2020-02-16 DIAGNOSIS — C641 Malignant neoplasm of right kidney, except renal pelvis: Secondary | ICD-10-CM

## 2020-02-16 DIAGNOSIS — R630 Anorexia: Secondary | ICD-10-CM | POA: Diagnosis not present

## 2020-02-16 DIAGNOSIS — G893 Neoplasm related pain (acute) (chronic): Secondary | ICD-10-CM | POA: Diagnosis not present

## 2020-02-16 DIAGNOSIS — I10 Essential (primary) hypertension: Secondary | ICD-10-CM

## 2020-02-16 MED ORDER — HYDROCODONE-ACETAMINOPHEN 7.5-325 MG PO TABS: ORAL_TABLET | ORAL | 0 refills | Status: DC

## 2020-02-16 NOTE — Patient Instructions (Addendum)
Please ask the [facility] people about changing you over to either wellbutrin (may increase energy) or paxil (can increase weight) I will get you in to see Johnsie Cancel again about your heart - for the time being continue lipitor   I do think you have some neuropathy in your feet- this may be due to your chemo drugs.  We will continue to monitor  Plan for covid booster 6 months after original series completed   Give my best to [facility]

## 2020-02-25 ENCOUNTER — Encounter: Payer: Self-pay | Admitting: Family Medicine

## 2020-03-01 DIAGNOSIS — C649 Malignant neoplasm of unspecified kidney, except renal pelvis: Secondary | ICD-10-CM | POA: Diagnosis not present

## 2020-03-05 DIAGNOSIS — M19012 Primary osteoarthritis, left shoulder: Secondary | ICD-10-CM | POA: Diagnosis not present

## 2020-03-15 DIAGNOSIS — Z79899 Other long term (current) drug therapy: Secondary | ICD-10-CM | POA: Diagnosis not present

## 2020-03-15 DIAGNOSIS — C649 Malignant neoplasm of unspecified kidney, except renal pelvis: Secondary | ICD-10-CM | POA: Diagnosis not present

## 2020-03-15 DIAGNOSIS — C7951 Secondary malignant neoplasm of bone: Secondary | ICD-10-CM | POA: Diagnosis not present

## 2020-03-15 DIAGNOSIS — D481 Neoplasm of uncertain behavior of connective and other soft tissue: Secondary | ICD-10-CM | POA: Diagnosis not present

## 2020-03-15 DIAGNOSIS — K4090 Unilateral inguinal hernia, without obstruction or gangrene, not specified as recurrent: Secondary | ICD-10-CM | POA: Diagnosis not present

## 2020-03-15 DIAGNOSIS — R809 Proteinuria, unspecified: Secondary | ICD-10-CM | POA: Diagnosis not present

## 2020-03-17 ENCOUNTER — Encounter: Payer: Self-pay | Admitting: Family Medicine

## 2020-03-17 DIAGNOSIS — C641 Malignant neoplasm of right kidney, except renal pelvis: Secondary | ICD-10-CM

## 2020-03-17 DIAGNOSIS — G893 Neoplasm related pain (acute) (chronic): Secondary | ICD-10-CM

## 2020-03-17 MED ORDER — HYDROCODONE-ACETAMINOPHEN 7.5-325 MG PO TABS: ORAL_TABLET | ORAL | 0 refills | Status: DC

## 2020-03-31 DIAGNOSIS — Z20822 Contact with and (suspected) exposure to covid-19: Secondary | ICD-10-CM | POA: Diagnosis not present

## 2020-03-31 DIAGNOSIS — Z9189 Other specified personal risk factors, not elsewhere classified: Secondary | ICD-10-CM | POA: Diagnosis not present

## 2020-03-31 DIAGNOSIS — J329 Chronic sinusitis, unspecified: Secondary | ICD-10-CM | POA: Diagnosis not present

## 2020-03-31 NOTE — Progress Notes (Signed)
CARDIOLOGY CONSULT NOTE       Patient ID: Douglas [name] MRN: [id number] DOB/AGE: September 22, 1955 64 y.o.  Admit date: (Not on file) Referring Physician: Copland Primary Physician: Darreld Mclean, MD Primary Cardiologist: New Reason for Consultation: Atherosclerosis  Active Problems:   * No active hospital problems. *   HPI:  64 y.o. with hereditary leiomyomatosis and renal cell cancer metastatic to bone He is in [facility] clinical trials and sees oncology at [facility] and [place]. He had some staging CT scans which showed concern for coronary calcification as will as narrowing in left subclavian, SMA, right renal and right internal iliac arteries BP is well controlled he is on statin Takes Cymbalta for depression Has some concerns about his wife's memory issues and his own health   I last saw him in 2015 for similar concerns of calcium seen on CT He had normal ETT Last TTE done 07/21/15 also normal   Seen by Dr Trula Slade 03/11/18 for mesenteric ischemia again similar findings on CT  He has no angina, no arm weakness and no post prandial pain. He has lost weight with the cancer but walks a mile/day And does his yard work   3 study drugs are Tarceva, Avastin and Xgeva. He does not want to have shoulder / hernia surgery as it would Warrant stopping these meds ? Bleeding effects and he is concerned tumor would get out of control [facility] infusions. Depression as expected.   Has been vaccinated and going to get booster   ROS All other systems reviewed and negative except as noted above  Past Medical History:  Diagnosis Date  . Allergic rhinitis   . Arthritis    "all over my body" (10/16/2013)  . Chronic back pain    "neck to lower back" (10/16/2013)  . Compression fracture    S/P L-SPINE; pt does not recall this hx on 10/16/2013  . COPD (chronic obstructive pulmonary disease) (HCC)    FVC .66  . Depression    "situational since 09/22/2013 OR"  . Encounter for long-term  (current) use of other medications   . GERD (gastroesophageal reflux disease)   . Hemoptysis   . 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 History of blood transfusion 09/2013   "think so; not sure; related to big OR"  . History of surgical fusion joint    DDD C-SPINE  . Hyperlipidemia   . Hypertension   . Leiomyoma OF SKIN   INCREASED RISK RENAL CELL CA  NEEDS CT OF KIDNEYS EVERY 2 YEARS NEXT DUE  02/06/13  . Melanoma in situ ([place])   . Melanoma of back ([place])   . Neuromuscular disorder ([place])   . Osteoarthritis   . Renal calculi   . Renal cell carcinoma ([place])    type 2; papillary  . Tobacco use disorder   . Unspecified vitamin D deficiency     Family History  Adopted: Yes  Problem Relation Age of Onset  . Hypertension Father     Social History   Socioeconomic History  . Marital status: Married    Spouse name: Not on file  . Number of children: Not on file  . Years of education: Not on file  . Highest education level: Not on file  Occupational History  . Occupation: Scientist, clinical (histocompatibility and immunogenetics): [employer]  Tobacco Use  . Smoking status: Former Smoker    Packs/day: 0.50    Years: 40.00  Pack years: 20.00    Types: Cigarettes  . Smokeless tobacco: Never Used  . Tobacco comment: Pt chewing Nicorette gum currently  Vaping Use  . Vaping Use: Never used  Substance and Sexual Activity  . Alcohol use: Yes    Alcohol/week: 0.0 standard drinks    Comment: 10/16/2013 "2-3 beers daily generally; nothing last month or so"  . Drug use: No  . Sexual activity: Not Currently  Other Topics Concern  . Not on file  Social History Narrative   Walks daily for exercise.   Social Determinants of Health   Financial Resource Strain:   . Difficulty of Paying Living Expenses: Not on file  Food Insecurity:   . Worried About Charity fundraiser in the Last Year: Not on file  . Ran Out of Food in the Last Year: Not on file  Transportation Needs:   . Lack of  Transportation (Medical): Not on file  . Lack of Transportation (Non-Medical): Not on file  Physical Activity:   . Days of Exercise per Week: Not on file  . Minutes of Exercise per Session: Not on file  Stress:   . Feeling of Stress : Not on file  Social Connections:   . Frequency of Communication with Friends and Family: Not on file  . Frequency of Social Gatherings with Friends and Family: Not on file  . Attends Religious Services: Not on file  . Active Member of Clubs or Organizations: Not on file  . Attends Archivist Meetings: Not on file  . Marital Status: Not on file  Intimate Partner Violence:   . Fear of Current or Ex-Partner: Not on file  . Emotionally Abused: Not on file  . Physically Abused: Not on file  . Sexually Abused: Not on file    Past Surgical History:  Procedure Laterality Date  . ANTERIOR CERVICAL DECOMP/DISCECTOMY FUSION  1992  . ANTERIOR CRUCIATE LIGAMENT REPAIR Bilateral [id number]  . APPENDECTOMY  09/22/2013  . CHOLECYSTECTOMY  09/22/2013   "had tumors in it"  . HAND LIGAMENT RECONSTRUCTION Right   . HARVEST BONE GRAFT Right 1992   "hip; for neck fusion"  . INGUINAL HERNIA REPAIR Right 2004  . LIGAMENT REPAIR Right ~ 2001   "little finger"  . LYMPH NODE DISSECTION Right 09/22/2013   "in the area of the kidney; they were clean"  . MELANOMA EXCISION  ~ 2007   "off my back"  . PARTIAL NEPHRECTOMY Right 09/22/2013  . removal of fascia overlying the right psoas Right 09/22/2013  . RENAL ARTERY STENT Right 09/22/2013  . RIGHT COLECTOMY Right 09/22/2013  . ROBOTIC ASSITED PARTIAL NEPHRECTOMY  05/22/2012   Procedure: ROBOTIC ASSITED PARTIAL NEPHRECTOMY;  Surgeon: Alexis Frock, MD;  Location: [facility];  Service: Urology;  Laterality: Right;  Right Robotic Cyst Decortication and Partial Nephrectomy   . SPINE SURGERY        Current Outpatient Medications:  .  atorvastatin (LIPITOR) 10 MG tablet, TAKE 1 TABLET BY MOUTH EVERYDAY AT BEDTIME,  Disp: 90 tablet, Rfl: 3 .  B Complex Vitamins (B COMPLEX 100 PO), Take 100 mg by mouth daily., Disp: , Rfl:  .  Bevacizumab (AVASTIN) 100 MG/4ML SOLN, Inject into the vein., Disp: , Rfl:  .  cetirizine (ZYRTEC) 10 MG tablet, Take 5 mg by mouth daily. , Disp: , Rfl:  .  denosumab (XGEVA) 120 MG/1.7ML SOLN injection, Inject 120 mg into the skin every 30 (thirty) days. , Disp: , Rfl:  .  doxycycline (ADOXA) 100 MG tablet, Take 100 mg by mouth 2 (two) times daily., Disp: , Rfl:  .  DULoxetine (CYMBALTA) 20 MG capsule, TAKE 1 CAPSULE BY MOUTH EVERY DAY, Disp: 90 capsule, Rfl: 3 .  erlotinib (TARCEVA) 150 MG tablet, Take 150 mg by mouth daily. Take on an empty stomach 1 hour before meals or 2 hours after, Disp: , Rfl:  .  HYDROcodone-acetaminophen (NORCO) 7.5-325 MG tablet, Take 1-2 tablets every 4-6 hours as needed for pain., Disp: 240 tablet, Rfl: 0 .  lidocaine (LIDODERM) 5 %, Place 1 patch onto the skin daily. Remove & Discard patch within 12 hours or as directed by MD.  May wear up to 3 patches at a time, Disp: 60 patch, Rfl: 6 .  loperamide (IMODIUM A-D) 2 MG tablet, Take 2 mg by mouth 4 (four) times daily as needed for diarrhea or loose stools., Disp: , Rfl:  .  LORazepam (ATIVAN) 1 MG tablet, Take 0.5-1 tablets (0.5-1 mg total) by mouth at bedtime as needed for sleep. TAKE ONE HALF OR ONE TABLET BY MOUTH AT BEDTIME IF NEEDED FOR SLEEP, DO NOT TAKE WITH PAIN MEDS, Disp: 30 tablet, Rfl: 5 .  losartan (COZAAR) 100 MG tablet, Take by mouth., Disp: , Rfl:  .  Multiple Vitamins-Minerals (CENTRUM SILVER 50+MEN PO), Take 1 tablet by mouth daily., Disp: , Rfl:  .  NIFEdipine (ADALAT CC) 90 MG 24 hr tablet, Take 90 mg by mouth daily., Disp: , Rfl:  .  ondansetron (ZOFRAN-ODT) 4 MG disintegrating tablet, TAKE 1 TABLET BY MOUTH SUBLINGUALLY EVERY 8 HOURS AS NEEDED FOR NAUSEA OR VOMITING, Disp: 40 tablet, Rfl: 2 .  pantoprazole (PROTONIX) 20 MG tablet, Take 20 mg by mouth daily., Disp: , Rfl:  .  UNABLE TO  FIND, Take 1 tablet by mouth daily. Med Name: Calcium 600/D-3 300 2 daily , Disp: , Rfl:  .  vitamin C (ASCORBIC ACID) 500 MG tablet, Take 500 mg by mouth daily., Disp: , Rfl:     Physical Exam: There were no vitals taken for this visit.   Affect appropriate Healthy:  appears stated age 38: normal Neck supple with no adenopathy JVP normal no bruits no thyromegaly Lungs clear with no wheezing and good diaphragmatic motion Heart:  S1/S2 no murmur, no rub, gallop or click PMI normal Abdomen: benighn, BS positve, no tenderness, no AAA no bruit.  No HSM or HJR Distal pulses intact with no bruits No edema Neuro non-focal Skin warm and dry No muscular weakness   Labs:   Lab Results  Component Value Date   WBC 8.9 01/26/2020   HGB 15.1 12/02/2019   HCT 43 01/26/2020   MCV 97.2 07/29/2018   PLT 244 01/26/2020   No results for input(s): NA, K, CL, CO2, BUN, CREATININE, CALCIUM, PROT, BILITOT, ALKPHOS, ALT, AST, GLUCOSE in the last 168 hours.  Invalid input(s): LABALBU Lab Results  Component Value Date   CHENIDP 824 07/29/2018    Lab Results  Component Value Date   CHOL 175 05/01/2019   CHOL 164 02/11/2018   CHOL 176 04/30/2017   Lab Results  Component Value Date   HDL 60.60 05/01/2019   HDL 66.50 02/11/2018   HDL 62.70 04/30/2017   Lab Results  Component Value Date   LDLCALC 72 03/25/2013   LDLCALC 96 02/20/2012   Lab Results  Component Value Date   TRIG 241.0 (H) 05/01/2019   TRIG 209.0 (H) 02/11/2018   TRIG 311.0 (H) 04/30/2017   Lab  Results  Component Value Date   CHOLHDL 3 05/01/2019   CHOLHDL 2 02/11/2018   CHOLHDL 3 04/30/2017   Lab Results  Component Value Date   LDLDIRECT 63.0 05/01/2019   LDLDIRECT 60.0 02/11/2018   LDLDIRECT 64.0 04/30/2017      Radiology: No results found.  EKG: SR rate 71 normal    ASSESSMENT AND PLAN:   1. CAD:  Subclinical seen on screening CT for cancer similar to 2015 ETT at that time normal Will repeat ETT  since asymptomatic and normal ECG   2. PVD:  F/u Dr Trula Slade as needed   3. Cancer:  F/u [facility] been in trial for 55 months   4. HLD: on statin labs with primary target LDL 70 or less  5. HTN: Well controlled.  Continue current medications and low sodium Dash type diet.    ETT F/U in a year if normal   Signed: Jenkins Rouge 03/31/2020, 2:33 PM

## 2020-04-09 ENCOUNTER — Encounter: Payer: Self-pay | Admitting: Cardiovascular Disease

## 2020-04-09 ENCOUNTER — Other Ambulatory Visit: Payer: Self-pay

## 2020-04-09 ENCOUNTER — Ambulatory Visit: Payer: Medicare HMO | Admitting: Cardiovascular Disease

## 2020-04-09 VITALS — BP 134/88 | HR 71 | Ht 73.0 in | Wt 165.2 lb

## 2020-04-09 DIAGNOSIS — I10 Essential (primary) hypertension: Secondary | ICD-10-CM

## 2020-04-09 DIAGNOSIS — E785 Hyperlipidemia, unspecified: Secondary | ICD-10-CM

## 2020-04-09 DIAGNOSIS — I2510 Atherosclerotic heart disease of native coronary artery without angina pectoris: Secondary | ICD-10-CM | POA: Diagnosis not present

## 2020-04-09 NOTE — Patient Instructions (Addendum)
Medication Instructions:  *If you need a refill on your cardiac medications before your next appointment, please call your pharmacy*  Lab Work: If you have labs (blood work) drawn today and your tests are completely normal, you will receive your results only by: Marland Kitchen MyChart Message (if you have MyChart) OR . A paper copy in the mail If you have any lab test that is abnormal or we need to change your treatment, we will call you to review the results.   Testing/Procedures: Your physician has requested that you have an exercise tolerance test. For further information please visit [email or web address]. Please also follow instruction sheet, as given.  Follow-Up: At [facility], you and your health needs are our priority.  As part of our continuing mission to provide you with exceptional heart care, we have created designated Provider Care Teams.  These Care Teams include your primary Cardiologist (physician) and Advanced Practice Providers (APPs -  Physician Assistants and Nurse Practitioners) who all work together to provide you with the care you need, when you need it.  We recommend signing up for the patient portal called "MyChart".  Sign up information is provided on this After Visit Summary.  MyChart is used to connect with patients for Virtual Visits (Telemedicine).  Patients are able to view lab/test results, encounter notes, upcoming appointments, etc.  Non-urgent messages can be sent to your provider as well.   To learn more about what you can do with MyChart, go to [email or web address].    Your next appointment:   12 month(s)  The format for your next appointment:   In Person  Provider:   You may see [name] or one of the following Advanced Practice Providers on your designated Care Team:    Truitt Merle, NP  Cecilie Kicks, NP  Kathyrn Drown, NP

## 2020-04-11 NOTE — Progress Notes (Signed)
[place] at [facility], [place], [place] [address] [phone]  Date:  04/14/2020   Name:  Douglas [name]   DOB:  [birth year]   MRN:  [id number]  PCP:  Darreld Mclean, MD    Chief Complaint: Flu Vaccine and Medication Refill (hypertension)   History of Present Illness:  Cristoval Teall is a 64 y.o. very pleasant male patient who presents with the following:  Patient here today for periodic follow-up Last seen by myself in August  [name]  has hereditaryleiomyomatosisand renal cell cancer-current treatments includeavastin, tarceva with denosumab forbone mets He is being treated in a clinical trial at [facility] for this metastatic cancer, he also does see an oncologist at [facility]  Recently his treatment has been complicated by renal insufficiency and proteinuria.  He has had to forego a few of his infusion treatments due to proteinuria  [name] has considerable pain from his bone mets as well as end-stage left shoulder arthritis, I am treating him with hydrocodone.  Need annual UDS today  He is feeling very stressed and having some anxiety and occasional panic attacks about his ongoing health problems He is on cymbalta for depression and pain Does use ativan at bedtime for sleep   He was seen by oncology at [facility] last month: Assessment and Plan:  Hereditary leiomyomatosis and renal cell cancer currently just on Tarceva. Periodically hold Avastin because of proteinuria. Continue to monitor. Regarding patient's questions about how his medications affect his body, I told him that he is doing well on the medications he is on, and that we continue to monitor the growth of his cancer. I explained that if there is any growth, that is when we will make changes to his treatment plan. RTC 3 months  Regarding patient's inquiry about postponing chemotherapy for shoulder and hernia surgery, I explained to him  that this is not an option for him. I explained that stopping chemotherapy will jeopardize his health and that once chemotherapy is over he can think about having shoulder and hernia surgery.   Denosumab for bone mets  High-risk medication reviewed potential toxicities of therapy has been receiving he has a very good understanding of it  Proteinuria most likely from Avastin. Continue to monitor. We advised him to be sure he consumes enough protein; he expressed understanding.   Molar extraction-No complaints today.   Left inguinal hernia - First noted in 08/2019. At his recent follow-up with the [facility] in 10/2019, it was determined that this hernia did not require surgical repair. Fortunately, his abdominal pain has improved since we last saw him. His appetite has also improved since 10/2019, and he is actively trying to gain weight. We will continue to monitor.  He also saw his cardiologist, [name] last week: 1. CAD:  Subclinical seen on screening CT for cancer similar to 2015 ETT at that time normal Will repeat ETT since asymptomatic and normal ECG  2. PVD:  F/u Dr Trula Slade as needed  3. Cancer:  F/u [facility] been in trial for 55 months  4. HLD: on statin labs with primary target LDL 70 or less 5. HTN: Well controlled.  Continue current medications and low sodium Dash type diet.   ETT F/U in a year if normal    COVID-19 series- done, plan to booster next week  Colon cancer screening-?  Patient has chosen to decline previously, confirmed with him- for the time being he  prefers to defer, but will let me know if this changes He would like to check a PSA today  Flu vaccine- give today.  He requests high dose as he is turning 64 in just a few days  We discussed this-  Safe, but may not be covered by insurance.  He would like to go ahead with high dose   Went over controlled substance database as below  [name] is checking his blood pressure once or twice a day.  He admits to becoming more  more concerned because his diastolic numbers are trending up, it is blood pressure is not under good control he will not be able to continue his study at [facility]    04/05/2020  12/10/2019   2  Lorazepam 1 Mg Tablet 30.00  30  Je Cop  [id number]  [place] ([id number])  4/5  1.00 LME  Medicare  [place]    03/21/2020  03/17/2020   1  Hydrocodone-Acetamin 7.5-325 240.00  30  Je Cop  [id number]  Wal ([id number])  0/0  60.00 MME  Comm Ins  [place]    03/07/2020  12/10/2019   2  Lorazepam 1 Mg Tablet 30.00  30  Je Cop  [id number]  [place] ([id number])  3/5  1.00 LME  Medicare  [place]    02/16/2020  02/16/2020   1  Hydrocodone-Acetamin 7.5-325 240.00  20  Je Cop  [id number]  Wal ([id number])  0/0  90.00 MME  Comm Ins  [place]    02/06/2020  12/10/2019   2  Lorazepam 1 Mg Tablet 30.00  30  Je Cop  [id number]  [place] ([id number])  2/5  1.00 LME  Medicare  [place]    01/24/2020  01/21/2020   1  Hydrocodone-Acetamin 7.5-325 240.00  20  Je Cop  [id number]  Wal ([id number])  0/0  90.00 MME  Comm Ins  [place]    01/07/2020  12/10/2019   2  Lorazepam 1 Mg Tablet 30.00  30  Je Cop  [id number]  [place] ([id number])  1/5  1.00 LME  Medicare  [place]    12/28/2019  12/24/2019   1  Hydrocodone-Acetamin 7.5-325 240.00  20  Je Cop  [id number]  Wal ([id number])  0/0  90.00 MME  Comm Ins  [place]    12/10/2019  12/10/2019   2  Lorazepam 1 Mg Tablet 30.00  30  Je Cop  [id number]  [place] ([id number])  0/5  1.00 LME  Medicare  [place]    11/26/2019  11/26/2019   1  Hydrocodone-Acetamin 7.5-325 240.00  20  Je Cop  [id number]  Wal ([id number])  0/0  90.00 MME  Comm Ins  [place]    11/08/2019  06/09/2019   2  Lorazepam 1 Mg Tablet 30.00  30  Je Cop  [id number]  [place] ([id number])  5/5  1.00 LME  Medicare  [place]    11/01/2019  10/28/2019   1  Hydrocodone-Acetamin 7.5-325 240.00  20  Je Cop  [id number]  Wal ([id number])  0/0  90.00 MME  Comm Ins  [place]    10/06/2019  06/09/2019   2  Lorazepam 1 Mg Tablet 30.00  30  Je Cop  [id number]         Patient Active Problem List   Diagnosis Date Noted  . Bone metastases ([place]) 07/26/2016  . CAD (coronary artery disease) 03/25/2014  . Protein-calorie malnutrition, severe  ([place]) 10/17/2013  . 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 Renal calculi   . GERD (gastroesophageal  reflux disease)   . Allergic rhinitis   . Osteoarthritis   . Encounter for long-term (current) use of other medications   . Hyperlipidemia   . Tobacco use disorder   . COPD (chronic obstructive pulmonary disease) ([place])   . Unspecified vitamin D deficiency   . Hypertension     Past Medical History:  Diagnosis Date  . Allergic rhinitis   . Arthritis    "all over my body" (10/16/2013)  . Chronic back pain    "neck to lower back" (10/16/2013)  . Compression fracture    S/P L-SPINE; pt does not recall this hx on 10/16/2013  . COPD (chronic obstructive pulmonary disease) (HCC)    FVC .66  . Depression    "situational since 09/22/2013 OR"  . Encounter for long-term (current) use of other medications   . GERD (gastroesophageal reflux disease)   . Hemoptysis   . 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 History of blood transfusion 09/2013   "think so; not sure; related to big OR"  . History of surgical fusion joint    DDD C-SPINE  . Hyperlipidemia   . Hypertension   . Leiomyoma OF SKIN   INCREASED RISK RENAL CELL CA  NEEDS CT OF KIDNEYS EVERY 2 YEARS NEXT DUE  02/06/13  . Melanoma in situ ([place])   . Melanoma of back ([place])   . Neuromuscular disorder ([place])   . Osteoarthritis   . Renal calculi   . Renal cell carcinoma ([place])    type 2; papillary  . Tobacco use disorder   . Unspecified vitamin D deficiency     Past Surgical History:  Procedure Laterality Date  . ANTERIOR CERVICAL DECOMP/DISCECTOMY FUSION  1992  . ANTERIOR CRUCIATE LIGAMENT REPAIR Bilateral [phone]  . APPENDECTOMY  09/22/2013  . CHOLECYSTECTOMY  09/22/2013   "had tumors in it"  . HAND LIGAMENT RECONSTRUCTION Right   . HARVEST BONE GRAFT Right 1992   "hip; for neck fusion"  . INGUINAL HERNIA REPAIR Right 2004  . LIGAMENT REPAIR Right ~ 2001    "little finger"  . LYMPH NODE DISSECTION Right 09/22/2013   "in the area of the kidney; they were clean"  . MELANOMA EXCISION  ~ 2007   "off my back"  . PARTIAL NEPHRECTOMY Right 09/22/2013  . removal of fascia overlying the right psoas Right 09/22/2013  . RENAL ARTERY STENT Right 09/22/2013  . RIGHT COLECTOMY Right 09/22/2013  . ROBOTIC ASSITED PARTIAL NEPHRECTOMY  05/22/2012   Procedure: ROBOTIC ASSITED PARTIAL NEPHRECTOMY;  Surgeon: Alexis Frock, MD;  Location: [facility] ORS;  Service: Urology;  Laterality: Right;  Right Robotic Cyst Decortication and Partial Nephrectomy   . SPINE SURGERY      Social History   Tobacco Use  . Smoking status: Former Smoker    Packs/day: 0.50    Years: 40.00    Pack years: 20.00    Types: Cigarettes  . Smokeless tobacco: Never Used  . Tobacco comment: Pt chewing Nicorette gum currently  Vaping Use  . Vaping Use: Never used  Substance Use Topics  . Alcohol use: Yes    Alcohol/week: 0.0 standard drinks    Comment: 10/16/2013 "2-3 beers daily generally; nothing last month or so"  . Drug use: No    Family History  Adopted: Yes  Problem Relation Age of Onset  . Hypertension Father     Allergies  Allergen Reactions  . Avelox [Moxifloxacin Hcl In Nacl] Hives, Shortness Of Breath and  Swelling  . Medical Adhesive Remover Dermatitis and Rash  . Tape Dermatitis and Rash    Medication list has been reviewed and updated.  Current Outpatient Medications on File Prior to Visit  Medication Sig Dispense Refill  . atorvastatin (LIPITOR) 10 MG tablet TAKE 1 TABLET BY MOUTH EVERYDAY AT BEDTIME 90 tablet 3  . B Complex Vitamins (B COMPLEX 100 PO) Take 100 mg by mouth daily.    . Bevacizumab (AVASTIN) 100 MG/4ML SOLN Inject into the vein.    . cetirizine (ZYRTEC) 10 MG tablet Take 5 mg by mouth daily.     Marland Kitchen denosumab (XGEVA) 120 MG/1.7ML SOLN injection Inject 120 mg into the skin every 30 (thirty) days.     Marland Kitchen doxycycline (ADOXA) 100 MG tablet Take 100 mg by mouth 2  (two) times daily.    . DULoxetine (CYMBALTA) 20 MG capsule TAKE 1 CAPSULE BY MOUTH EVERY DAY 90 capsule 3  . erlotinib (TARCEVA) 150 MG tablet Take 150 mg by mouth daily. Take on an empty stomach 1 hour before meals or 2 hours after    . HYDROcodone-acetaminophen (NORCO) 7.5-325 MG tablet Take 1-2 tablets every 4-6 hours as needed for pain. 240 tablet 0  . lidocaine (LIDODERM) 5 % Place 1 patch onto the skin daily. Remove & Discard patch within 12 hours or as directed by MD.  May wear up to 3 patches at a time 60 patch 6  . loperamide (IMODIUM A-D) 2 MG tablet Take 2 mg by mouth 4 (four) times daily as needed for diarrhea or loose stools.    Marland Kitchen LORazepam (ATIVAN) 1 MG tablet Take 0.5-1 tablets (0.5-1 mg total) by mouth at bedtime as needed for sleep. TAKE ONE HALF OR ONE TABLET BY MOUTH AT BEDTIME IF NEEDED FOR SLEEP, DO NOT TAKE WITH PAIN MEDS 30 tablet 5  . losartan (COZAAR) 100 MG tablet Take by mouth.    . Multiple Vitamins-Minerals (CENTRUM SILVER 50+MEN PO) Take 1 tablet by mouth daily.    Marland Kitchen NIFEdipine (ADALAT CC) 90 MG 24 hr tablet Take 90 mg by mouth daily.    . ondansetron (ZOFRAN-ODT) 4 MG disintegrating tablet TAKE 1 TABLET BY MOUTH SUBLINGUALLY EVERY 8 HOURS AS NEEDED FOR NAUSEA OR VOMITING 40 tablet 2  . pantoprazole (PROTONIX) 20 MG tablet Take 20 mg by mouth daily.    Marland Kitchen UNABLE TO FIND Take 1 tablet by mouth daily. Med Name: Calcium 600/D-3 300 2 daily     . vitamin C (ASCORBIC ACID) 500 MG tablet Take 500 mg by mouth daily.     No current facility-administered medications on file prior to visit.    Review of Systems:  As per HPI- otherwise negative.   Physical Examination: Vitals:   04/14/20 0843  BP: 122/84  Pulse: 90  Resp: 16  SpO2: 97%   Vitals:   04/14/20 0843  Weight: 160 lb (72.6 kg)  Height: 6\' 1"  (1.854 m)   Body mass index is 21.11 kg/m. Ideal Body Weight: Weight in (lb) to have BMI = 25: 189.1  GEN: no acute distress.  Slim build, looks well  HEENT:  Atraumatic, Normocephalic.  Ears and Nose: No external deformity. CV: RRR, No M/G/R. No JVD. No thrill. No extra heart sounds. PULM: CTA B, no wheezes, crackles, rhonchi. No retractions. No resp. distress. No accessory muscle use. EXTR: No c/c/e PSYCH: Normally interactive. Conversant.  Walks with slight limp due to bone pain    Assessment and Plan: Renal cell cancer, right (  [place]) - Plan: HYDROcodone-acetaminophen ([place]) 7.5-325 MG tablet  Chronic pain due to neoplasm - Plan: DRUG MONITORING, PANEL 8 WITH CONFIRMATION, URINE, HYDROcodone-acetaminophen (NORCO) 7.5-325 MG tablet  Reactive depression  Essential hypertension  Screening for prostate cancer - Plan: PSA  Following up today [name] is very concerned about his BP- his diastolic numbers have been 90+ at times and this could endanger his participation in the [facility] study.  We discussed his medications, we can go up on nifedipine if needed.  I encouraged him to decrease blood pressure monitoring to once a day if allowed as this is causing a lot of anxiety-he will try to cut down on monitoring See notes worsening of his insomnia and anxiety lately.  I would like to add trazodone to his regimen.  He is traveling to the [place] next week and will inquire about this medication, if okay with them I will prescribe Refilled hydrocodone today  UDS pending Blood pressure well controlled at visit today Order PSA This visit occurred during the SARS-CoV-2 public health emergency.  Safety protocols were in place, including screening questions prior to the visit, additional usage of staff PPE, and extensive cleaning of exam room while observing appropriate contact time as indicated for disinfecting solutions.     Signed Lamar Blinks, MD

## 2020-04-14 ENCOUNTER — Other Ambulatory Visit: Payer: Self-pay

## 2020-04-14 ENCOUNTER — Ambulatory Visit (INDEPENDENT_AMBULATORY_CARE_PROVIDER_SITE_OTHER): Payer: Medicare HMO | Admitting: Family Medicine

## 2020-04-14 ENCOUNTER — Encounter: Payer: Self-pay | Admitting: Family Medicine

## 2020-04-14 VITALS — BP 122/84 | HR 90 | Resp 16 | Ht 73.0 in | Wt 160.0 lb

## 2020-04-14 DIAGNOSIS — Z79899 Other long term (current) drug therapy: Secondary | ICD-10-CM | POA: Diagnosis not present

## 2020-04-14 DIAGNOSIS — F329 Major depressive disorder, single episode, unspecified: Secondary | ICD-10-CM | POA: Diagnosis not present

## 2020-04-14 DIAGNOSIS — Z125 Encounter for screening for malignant neoplasm of prostate: Secondary | ICD-10-CM

## 2020-04-14 DIAGNOSIS — D849 Immunodeficiency, unspecified: Secondary | ICD-10-CM | POA: Diagnosis not present

## 2020-04-14 DIAGNOSIS — Z23 Encounter for immunization: Secondary | ICD-10-CM | POA: Diagnosis not present

## 2020-04-14 DIAGNOSIS — I10 Essential (primary) hypertension: Secondary | ICD-10-CM | POA: Diagnosis not present

## 2020-04-14 DIAGNOSIS — G893 Neoplasm related pain (acute) (chronic): Secondary | ICD-10-CM | POA: Diagnosis not present

## 2020-04-14 DIAGNOSIS — C641 Malignant neoplasm of right kidney, except renal pelvis: Secondary | ICD-10-CM | POA: Diagnosis not present

## 2020-04-14 DIAGNOSIS — R69 Illness, unspecified: Secondary | ICD-10-CM | POA: Diagnosis not present

## 2020-04-14 LAB — PSA: PSA: 1.86 ng/mL (ref <=4.0)

## 2020-04-14 MED ORDER — HYDROCODONE-ACETAMINOPHEN 7.5-325 MG PO TABS: ORAL_TABLET | ORAL | 0 refills | Status: DC

## 2020-04-14 NOTE — Patient Instructions (Addendum)
Great to see you again as always! Best of luck with your re-stage  Please ask the [facility] people about adding trazodone to your regimen- 25- 50 mg at bedtime. If this is ok with them I will send in rx  I would recommend checking your BP just once a day esp as it is causing so much anxiety

## 2020-04-15 ENCOUNTER — Encounter: Payer: Self-pay | Admitting: Family Medicine

## 2020-04-18 ENCOUNTER — Encounter: Payer: Self-pay | Admitting: Family Medicine

## 2020-04-18 LAB — DRUG MONITORING, PANEL 8 WITH CONFIRMATION, URINE
6 Acetylmorphine: NEGATIVE ng/mL (ref <10)
Alcohol Metabolites: NEGATIVE ng/mL
Alphahydroxyalprazolam: NEGATIVE ng/mL (ref <25)
Alphahydroxymidazolam: NEGATIVE ng/mL (ref <50)
Alphahydroxytriazolam: NEGATIVE ng/mL (ref <50)
Aminoclonazepam: NEGATIVE ng/mL (ref <25)
Amphetamines: NEGATIVE ng/mL (ref <500)
Benzodiazepines: POSITIVE ng/mL — AB (ref <100)
Buprenorphine, Urine: NEGATIVE ng/mL (ref <5)
Cocaine Metabolite: NEGATIVE ng/mL (ref <150)
Codeine: NEGATIVE ng/mL (ref <50)
Creatinine: 141.2 mg/dL
Hydrocodone: 2112 ng/mL — ABNORMAL HIGH (ref <50)
Hydromorphone: 2512 ng/mL — ABNORMAL HIGH (ref <50)
Hydroxyethylflurazepam: NEGATIVE ng/mL (ref <50)
Lorazepam: 666 ng/mL — ABNORMAL HIGH (ref <50)
MDMA: NEGATIVE ng/mL (ref <500)
Marijuana Metabolite: 34 ng/mL — ABNORMAL HIGH (ref <5)
Marijuana Metabolite: POSITIVE ng/mL — AB (ref <20)
Morphine: NEGATIVE ng/mL (ref <50)
Nordiazepam: NEGATIVE ng/mL (ref <50)
Norhydrocodone: 5323 ng/mL — ABNORMAL HIGH (ref <50)
Opiates: POSITIVE ng/mL — AB (ref <100)
Oxazepam: NEGATIVE ng/mL (ref <50)
Oxidant: NEGATIVE ug/mL
Oxycodone: NEGATIVE ng/mL (ref <100)
Temazepam: NEGATIVE ng/mL (ref <50)
pH: 5.8 (ref 4.5–9.0)

## 2020-04-18 LAB — DM TEMPLATE

## 2020-04-19 LAB — COMPREHENSIVE METABOLIC PANEL: GFR calc non Af Amer: 39

## 2020-04-19 LAB — BASIC METABOLIC PANEL
BUN: 24 — AB (ref 4–21)
CO2: 21 (ref 13–22)
Chloride: 109 — AB (ref 99–108)
Creatinine: 1.8 — AB (ref 0.6–1.3)
Glucose: 75
Potassium: 4.2 (ref 3.4–5.3)
Sodium: 138 (ref 137–147)

## 2020-04-19 LAB — HEPATIC FUNCTION PANEL
ALT: 25 (ref 10–40)
AST: 27 (ref 14–40)
Alkaline Phosphatase: 44 (ref 25–125)
Bilirubin, Total: 0.4

## 2020-04-19 LAB — CBC AND DIFFERENTIAL
HCT: 42 (ref 41–53)
Hemoglobin: 14.1 (ref 13.5–17.5)
Platelets: 206 (ref 150–399)
WBC: 6.7

## 2020-04-21 LAB — BASIC METABOLIC PANEL
BUN: 20 (ref 4–21)
CO2: 23 — AB (ref 13–22)
Chloride: 108 (ref 99–108)
Creatinine: 1.3 (ref 0.6–1.3)
Glucose: 97
Potassium: 4.2 (ref 3.4–5.3)

## 2020-04-21 LAB — COMPREHENSIVE METABOLIC PANEL: GFR calc non Af Amer: 59

## 2020-04-26 ENCOUNTER — Encounter: Payer: Self-pay | Admitting: Family Medicine

## 2020-04-26 ENCOUNTER — Telehealth: Payer: Self-pay | Admitting: Family Medicine

## 2020-04-26 DIAGNOSIS — L570 Actinic keratosis: Secondary | ICD-10-CM | POA: Diagnosis not present

## 2020-04-26 DIAGNOSIS — L821 Other seborrheic keratosis: Secondary | ICD-10-CM | POA: Diagnosis not present

## 2020-04-26 DIAGNOSIS — F5101 Primary insomnia: Secondary | ICD-10-CM

## 2020-04-26 DIAGNOSIS — Z85828 Personal history of other malignant neoplasm of skin: Secondary | ICD-10-CM | POA: Diagnosis not present

## 2020-04-26 DIAGNOSIS — D225 Melanocytic nevi of trunk: Secondary | ICD-10-CM | POA: Diagnosis not present

## 2020-04-26 NOTE — Telephone Encounter (Signed)
Caller name: Adam Phenix  PA, [employer] of health Call back number:[phone]  She would like to speak to you in regards to patient starting trazodone.

## 2020-04-26 NOTE — Telephone Encounter (Signed)
  Called Lisa back.  Patient is okay to try trazodone for the perspective of interactions with his cancer medications.  However, we do need to be cautious of sedation and also serotonin levels, QT prolongation  He did have an EKG about 2 weeks ago which showed a normal QTC  I will touch base with patient and see if he would like to move forward with trazodone or not

## 2020-04-28 MED ORDER — TRAZODONE HCL 50 MG PO TABS: 25.0000 mg | ORAL_TABLET | Freq: Every evening | ORAL | 3 refills | Status: DC | PRN

## 2020-04-29 ENCOUNTER — Encounter: Payer: Self-pay | Admitting: Family Medicine

## 2020-05-07 ENCOUNTER — Other Ambulatory Visit (HOSPITAL_COMMUNITY)
Admission: RE | Admit: 2020-05-07 | Discharge: 2020-05-07 | Disposition: A | Discharge status: Home / Self Care | Payer: Medicare HMO | Source: Ambulatory Visit | Attending: Cardiovascular Disease | Admitting: Cardiovascular Disease

## 2020-05-07 DIAGNOSIS — Z01812 Encounter for preprocedural laboratory examination: Secondary | ICD-10-CM | POA: Diagnosis not present

## 2020-05-07 DIAGNOSIS — Z20822 Contact with and (suspected) exposure to covid-19: Secondary | ICD-10-CM | POA: Diagnosis not present

## 2020-05-07 LAB — SARS CORONAVIRUS 2 (TAT 6-24 HRS): SARS Coronavirus 2: NEGATIVE

## 2020-05-10 ENCOUNTER — Encounter: Payer: Self-pay | Admitting: Family Medicine

## 2020-05-10 DIAGNOSIS — G893 Neoplasm related pain (acute) (chronic): Secondary | ICD-10-CM

## 2020-05-10 DIAGNOSIS — R110 Nausea: Secondary | ICD-10-CM

## 2020-05-10 DIAGNOSIS — C641 Malignant neoplasm of right kidney, except renal pelvis: Secondary | ICD-10-CM

## 2020-05-11 ENCOUNTER — Ambulatory Visit (INDEPENDENT_AMBULATORY_CARE_PROVIDER_SITE_OTHER): Payer: Medicare HMO

## 2020-05-11 ENCOUNTER — Encounter: Payer: Self-pay | Admitting: Family Medicine

## 2020-05-11 ENCOUNTER — Other Ambulatory Visit: Payer: Self-pay

## 2020-05-11 DIAGNOSIS — I2510 Atherosclerotic heart disease of native coronary artery without angina pectoris: Secondary | ICD-10-CM | POA: Diagnosis not present

## 2020-05-11 DIAGNOSIS — E785 Hyperlipidemia, unspecified: Secondary | ICD-10-CM | POA: Diagnosis not present

## 2020-05-11 DIAGNOSIS — I10 Essential (primary) hypertension: Secondary | ICD-10-CM | POA: Diagnosis not present

## 2020-05-11 LAB — EXERCISE TOLERANCE TEST
Estimated workload: 7 METS
Exercise duration (min): 6 min
Exercise duration (sec): 0 s
MPHR: 155 {beats}/min
Peak HR: 146 {beats}/min
Percent HR: 94 %
RPE: 15
Rest HR: 85 {beats}/min

## 2020-05-11 MED ORDER — HYDROCODONE-ACETAMINOPHEN 7.5-325 MG PO TABS: ORAL_TABLET | ORAL | 0 refills | Status: DC

## 2020-05-11 MED ORDER — ONDANSETRON 4 MG PO TBDP: ORAL_TABLET | ORAL | 2 refills | Status: DC

## 2020-05-11 NOTE — Addendum Note (Signed)
Addended by: Lamar Blinks C on: 05/11/2020 03:21 PM   Modules accepted: Orders

## 2020-05-21 ENCOUNTER — Other Ambulatory Visit: Payer: Self-pay | Admitting: Family Medicine

## 2020-05-21 DIAGNOSIS — F5101 Primary insomnia: Secondary | ICD-10-CM

## 2020-05-24 ENCOUNTER — Ambulatory Visit (INDEPENDENT_AMBULATORY_CARE_PROVIDER_SITE_OTHER): Payer: Medicare HMO | Admitting: Family Medicine

## 2020-05-24 ENCOUNTER — Telehealth: Payer: Self-pay | Admitting: Cardiovascular Disease

## 2020-05-24 ENCOUNTER — Ambulatory Visit (HOSPITAL_COMMUNITY): Admit: 2020-05-24 | Payer: Medicare HMO | Admitting: Cardiology

## 2020-05-24 ENCOUNTER — Other Ambulatory Visit: Payer: Self-pay

## 2020-05-24 ENCOUNTER — Inpatient Hospital Stay (HOSPITAL_BASED_OUTPATIENT_CLINIC_OR_DEPARTMENT_OTHER)
Admission: EM | Admit: 2020-05-24 | Discharge: 2020-05-26 | DRG: 246 | Disposition: A | Discharge status: Home / Self Care | Payer: Medicare HMO | Attending: Cardiology | Admitting: Cardiology

## 2020-05-24 ENCOUNTER — Inpatient Hospital Stay (HOSPITAL_COMMUNITY)
Admission: EM | Disposition: A | Discharge status: Home / Self Care | Payer: Self-pay | Source: Home / Self Care | Attending: Cardiology

## 2020-05-24 ENCOUNTER — Encounter (HOSPITAL_BASED_OUTPATIENT_CLINIC_OR_DEPARTMENT_OTHER): Payer: Self-pay

## 2020-05-24 ENCOUNTER — Telehealth: Payer: Self-pay | Admitting: Family Medicine

## 2020-05-24 VITALS — BP 106/80 | HR 102 | Resp 16 | Ht 73.0 in | Wt 160.0 lb

## 2020-05-24 DIAGNOSIS — Z981 Arthrodesis status: Secondary | ICD-10-CM | POA: Diagnosis not present

## 2020-05-24 DIAGNOSIS — Z8249 Family history of ischemic heart disease and other diseases of the circulatory system: Secondary | ICD-10-CM

## 2020-05-24 DIAGNOSIS — C7951 Secondary malignant neoplasm of bone: Secondary | ICD-10-CM | POA: Diagnosis present

## 2020-05-24 DIAGNOSIS — I255 Ischemic cardiomyopathy: Secondary | ICD-10-CM | POA: Diagnosis present

## 2020-05-24 DIAGNOSIS — Z85528 Personal history of other malignant neoplasm of kidney: Secondary | ICD-10-CM | POA: Diagnosis not present

## 2020-05-24 DIAGNOSIS — Z87891 Personal history of nicotine dependence: Secondary | ICD-10-CM

## 2020-05-24 DIAGNOSIS — J449 Chronic obstructive pulmonary disease, unspecified: Secondary | ICD-10-CM | POA: Diagnosis present

## 2020-05-24 DIAGNOSIS — I5041 Acute combined systolic (congestive) and diastolic (congestive) heart failure: Secondary | ICD-10-CM | POA: Diagnosis not present

## 2020-05-24 DIAGNOSIS — Z79899 Other long term (current) drug therapy: Secondary | ICD-10-CM | POA: Diagnosis not present

## 2020-05-24 DIAGNOSIS — R1013 Epigastric pain: Secondary | ICD-10-CM

## 2020-05-24 DIAGNOSIS — M19012 Primary osteoarthritis, left shoulder: Secondary | ICD-10-CM | POA: Diagnosis present

## 2020-05-24 DIAGNOSIS — K219 Gastro-esophageal reflux disease without esophagitis: Secondary | ICD-10-CM | POA: Diagnosis not present

## 2020-05-24 DIAGNOSIS — I2102 ST elevation (STEMI) myocardial infarction involving left anterior descending coronary artery: Secondary | ICD-10-CM | POA: Diagnosis not present

## 2020-05-24 DIAGNOSIS — E782 Mixed hyperlipidemia: Secondary | ICD-10-CM | POA: Diagnosis not present

## 2020-05-24 DIAGNOSIS — I2510 Atherosclerotic heart disease of native coronary artery without angina pectoris: Secondary | ICD-10-CM | POA: Diagnosis present

## 2020-05-24 DIAGNOSIS — Z9049 Acquired absence of other specified parts of digestive tract: Secondary | ICD-10-CM

## 2020-05-24 DIAGNOSIS — E559 Vitamin D deficiency, unspecified: Secondary | ICD-10-CM | POA: Diagnosis present

## 2020-05-24 DIAGNOSIS — I10 Essential (primary) hypertension: Secondary | ICD-10-CM | POA: Diagnosis present

## 2020-05-24 DIAGNOSIS — I213 ST elevation (STEMI) myocardial infarction of unspecified site: Secondary | ICD-10-CM

## 2020-05-24 DIAGNOSIS — Z905 Acquired absence of kidney: Secondary | ICD-10-CM | POA: Diagnosis not present

## 2020-05-24 DIAGNOSIS — I2109 ST elevation (STEMI) myocardial infarction involving other coronary artery of anterior wall: Secondary | ICD-10-CM | POA: Diagnosis not present

## 2020-05-24 DIAGNOSIS — Z20822 Contact with and (suspected) exposure to covid-19: Secondary | ICD-10-CM | POA: Diagnosis present

## 2020-05-24 DIAGNOSIS — Z888 Allergy status to other drugs, medicaments and biological substances status: Secondary | ICD-10-CM | POA: Diagnosis not present

## 2020-05-24 DIAGNOSIS — Z86006 Personal history of melanoma in-situ: Secondary | ICD-10-CM

## 2020-05-24 DIAGNOSIS — Z85820 Personal history of malignant melanoma of skin: Secondary | ICD-10-CM | POA: Diagnosis not present

## 2020-05-24 DIAGNOSIS — Z955 Presence of coronary angioplasty implant and graft: Secondary | ICD-10-CM

## 2020-05-24 DIAGNOSIS — C649 Malignant neoplasm of unspecified kidney, except renal pelvis: Secondary | ICD-10-CM | POA: Diagnosis not present

## 2020-05-24 DIAGNOSIS — R7989 Other specified abnormal findings of blood chemistry: Secondary | ICD-10-CM | POA: Diagnosis present

## 2020-05-24 DIAGNOSIS — I5043 Acute on chronic combined systolic (congestive) and diastolic (congestive) heart failure: Secondary | ICD-10-CM | POA: Diagnosis not present

## 2020-05-24 DIAGNOSIS — F419 Anxiety disorder, unspecified: Secondary | ICD-10-CM | POA: Diagnosis present

## 2020-05-24 DIAGNOSIS — Z87442 Personal history of urinary calculi: Secondary | ICD-10-CM

## 2020-05-24 DIAGNOSIS — I110 Hypertensive heart disease with heart failure: Secondary | ICD-10-CM | POA: Diagnosis present

## 2020-05-24 DIAGNOSIS — E785 Hyperlipidemia, unspecified: Secondary | ICD-10-CM | POA: Diagnosis present

## 2020-05-24 DIAGNOSIS — I214 Non-ST elevation (NSTEMI) myocardial infarction: Secondary | ICD-10-CM | POA: Diagnosis present

## 2020-05-24 DIAGNOSIS — R9431 Abnormal electrocardiogram [ECG] [EKG]: Secondary | ICD-10-CM | POA: Diagnosis not present

## 2020-05-24 DIAGNOSIS — I739 Peripheral vascular disease, unspecified: Secondary | ICD-10-CM | POA: Diagnosis present

## 2020-05-24 DIAGNOSIS — R079 Chest pain, unspecified: Secondary | ICD-10-CM | POA: Diagnosis not present

## 2020-05-24 HISTORY — PX: CORONARY STENT INTERVENTION: CATH118234

## 2020-05-24 HISTORY — PX: LEFT HEART CATH AND CORONARY ANGIOGRAPHY: CATH118249

## 2020-05-24 LAB — COMPREHENSIVE METABOLIC PANEL
ALT: 91 U/L — ABNORMAL HIGH (ref 0–44)
AST: 447 U/L — ABNORMAL HIGH (ref 15–41)
Albumin: 4 g/dL (ref 3.5–5.0)
Alkaline Phosphatase: 55 U/L (ref 38–126)
Anion gap: 11 (ref 5–15)
BUN: 22 mg/dL (ref 8–23)
CO2: 22 mmol/L (ref 22–32)
Calcium: 9.8 mg/dL (ref 8.9–10.3)
Chloride: 104 mmol/L (ref 98–111)
Creatinine, Ser: 1.43 mg/dL — ABNORMAL HIGH (ref 0.61–1.24)
GFR, Estimated: 54 mL/min — ABNORMAL LOW (ref >60)
Glucose, Bld: 94 mg/dL (ref 70–99)
Potassium: 3.7 mmol/L (ref 3.5–5.1)
Sodium: 137 mmol/L (ref 135–145)
Total Bilirubin: 0.7 mg/dL (ref 0.3–1.2)
Total Protein: 7.1 g/dL (ref 6.5–8.1)

## 2020-05-24 LAB — CBC
HCT: 39.6 % (ref 39.0–52.0)
Hemoglobin: 13.9 g/dL (ref 13.0–17.0)
MCH: 33.1 pg (ref 26.0–34.0)
MCHC: 35.1 g/dL (ref 30.0–36.0)
MCV: 94.3 fL (ref 80.0–100.0)
Platelets: 254 10*3/uL (ref 150–400)
RBC: 4.2 MIL/uL — ABNORMAL LOW (ref 4.22–5.81)
RDW: 13.7 % (ref 11.5–15.5)
WBC: 11 10*3/uL — ABNORMAL HIGH (ref 4.0–10.5)
nRBC: 0 % (ref 0.0–0.2)

## 2020-05-24 LAB — CBC WITH DIFFERENTIAL/PLATELET
Abs Immature Granulocytes: 0.05 10*3/uL (ref 0.00–0.07)
Basophils Absolute: 0 10*3/uL (ref 0.0–0.1)
Basophils Relative: 0 %
Eosinophils Absolute: 0.1 10*3/uL (ref 0.0–0.5)
Eosinophils Relative: 1 %
HCT: 46.9 % (ref 39.0–52.0)
Hemoglobin: 16.1 g/dL (ref 13.0–17.0)
Immature Granulocytes: 0 %
Lymphocytes Relative: 13 %
Lymphs Abs: 2 10*3/uL (ref 0.7–4.0)
MCH: 32.4 pg (ref 26.0–34.0)
MCHC: 34.3 g/dL (ref 30.0–36.0)
MCV: 94.4 fL (ref 80.0–100.0)
Monocytes Absolute: 1.4 10*3/uL — ABNORMAL HIGH (ref 0.1–1.0)
Monocytes Relative: 9 %
Neutro Abs: 11.5 10*3/uL — ABNORMAL HIGH (ref 1.7–7.7)
Neutrophils Relative %: 77 %
Platelets: 291 10*3/uL (ref 150–400)
RBC: 4.97 MIL/uL (ref 4.22–5.81)
RDW: 13.5 % (ref 11.5–15.5)
WBC: 15.1 10*3/uL — ABNORMAL HIGH (ref 4.0–10.5)
nRBC: 0 % (ref 0.0–0.2)

## 2020-05-24 LAB — PROTIME-INR
INR: 1 (ref 0.8–1.2)
Prothrombin Time: 12.3 seconds (ref 11.4–15.2)

## 2020-05-24 LAB — CREATININE, SERUM
Creatinine, Ser: 1.26 mg/dL — ABNORMAL HIGH (ref 0.61–1.24)
GFR, Estimated: >60 mL/min (ref >60)

## 2020-05-24 LAB — POCT ACTIVATED CLOTTING TIME: Activated Clotting Time: 333 seconds

## 2020-05-24 LAB — RESP PANEL BY RT-PCR (FLU A&B, COVID) ARPGX2
Influenza A by PCR: NEGATIVE
Influenza B by PCR: NEGATIVE
SARS Coronavirus 2 by RT PCR: NEGATIVE

## 2020-05-24 LAB — TROPONIN I (HIGH SENSITIVITY)
Troponin I (High Sensitivity): >27000 ng/L (ref <18)
Troponin I (High Sensitivity): >27000 ng/L (ref <18)

## 2020-05-24 LAB — TSH: TSH: 1.376 u[IU]/mL (ref 0.350–4.500)

## 2020-05-24 SURGERY — LEFT HEART CATH AND CORONARY ANGIOGRAPHY
Anesthesia: LOCAL

## 2020-05-24 MED ORDER — ASPIRIN EC 81 MG PO TBEC
81.0000 mg | DELAYED_RELEASE_TABLET | Freq: Every day | ORAL | Status: DC
  Administered 2020-05-25 – 2020-05-26 (×2): 81 mg via ORAL
  Filled 2020-05-24 (×2): qty 1

## 2020-05-24 MED ORDER — MIDAZOLAM HCL 2 MG/2ML IJ SOLN
INTRAMUSCULAR | Status: AC
  Filled 2020-05-24: qty 2

## 2020-05-24 MED ORDER — FENTANYL CITRATE (PF) 100 MCG/2ML IJ SOLN
INTRAMUSCULAR | Status: DC | PRN
  Administered 2020-05-24 (×2): 25 ug via INTRAVENOUS
  Administered 2020-05-24: 50 ug via INTRAVENOUS

## 2020-05-24 MED ORDER — LIDOCAINE HCL (PF) 1 % IJ SOLN
INTRAMUSCULAR | Status: DC | PRN
  Administered 2020-05-24: 2 mL

## 2020-05-24 MED ORDER — HYDROCODONE-ACETAMINOPHEN 7.5-325 MG PO TABS
2.0000 | ORAL_TABLET | ORAL | Status: DC | PRN
  Administered 2020-05-25 (×3): 2 via ORAL
  Filled 2020-05-24 (×5): qty 2

## 2020-05-24 MED ORDER — LORAZEPAM 1 MG PO TABS
1.0000 mg | ORAL_TABLET | Freq: Every evening | ORAL | Status: DC | PRN
  Administered 2020-05-25 (×2): 1 mg via ORAL
  Filled 2020-05-24 (×2): qty 1

## 2020-05-24 MED ORDER — TICAGRELOR 90 MG PO TABS
ORAL_TABLET | ORAL | Status: DC | PRN
  Administered 2020-05-24: 180 mg via ORAL

## 2020-05-24 MED ORDER — NITROGLYCERIN 1 MG/10 ML FOR IR/CATH LAB
INTRA_ARTERIAL | Status: DC | PRN
  Administered 2020-05-24: 200 ug via INTRACORONARY

## 2020-05-24 MED ORDER — HEPARIN (PORCINE) IN NACL 1000-0.9 UT/500ML-% IV SOLN
INTRAVENOUS | Status: AC
  Filled 2020-05-24: qty 1000

## 2020-05-24 MED ORDER — HYDRALAZINE HCL 20 MG/ML IJ SOLN
10.0000 mg | INTRAMUSCULAR | Status: AC | PRN
  Administered 2020-05-24: 10 mg via INTRAVENOUS

## 2020-05-24 MED ORDER — HEPARIN BOLUS VIA INFUSION: 4000.0000 [IU] | Freq: Once | INTRAVENOUS | Status: DC

## 2020-05-24 MED ORDER — DULOXETINE HCL 20 MG PO CPEP
20.0000 mg | ORAL_CAPSULE | Freq: Every day | ORAL | Status: DC
  Administered 2020-05-25 – 2020-05-26 (×2): 20 mg via ORAL
  Filled 2020-05-24 (×2): qty 1

## 2020-05-24 MED ORDER — VERAPAMIL HCL 2.5 MG/ML IV SOLN
INTRAVENOUS | Status: DC | PRN
  Administered 2020-05-24: 10 mL via INTRA_ARTERIAL

## 2020-05-24 MED ORDER — VERAPAMIL HCL 2.5 MG/ML IV SOLN
INTRAVENOUS | Status: AC
  Filled 2020-05-24: qty 2

## 2020-05-24 MED ORDER — NITROGLYCERIN 1 MG/10 ML FOR IR/CATH LAB
INTRA_ARTERIAL | Status: AC
  Filled 2020-05-24: qty 10

## 2020-05-24 MED ORDER — CLOPIDOGREL BISULFATE 300 MG PO TABS: 300.0000 mg | ORAL_TABLET | Freq: Every day | ORAL | Status: DC

## 2020-05-24 MED ORDER — SODIUM CHLORIDE 0.9 % IV SOLN: 250.0000 mL | INTRAVENOUS | Status: DC | PRN

## 2020-05-24 MED ORDER — PANTOPRAZOLE SODIUM 20 MG PO TBEC
20.0000 mg | DELAYED_RELEASE_TABLET | Freq: Every day | ORAL | Status: DC
  Administered 2020-05-25 – 2020-05-26 (×2): 20 mg via ORAL
  Filled 2020-05-24 (×2): qty 1

## 2020-05-24 MED ORDER — TRAZODONE HCL 50 MG PO TABS: 25.0000 mg | ORAL_TABLET | Freq: Every evening | ORAL | Status: DC | PRN

## 2020-05-24 MED ORDER — ENSURE ENLIVE PO LIQD: 237.0000 mL | Freq: Two times a day (BID) | ORAL | Status: DC

## 2020-05-24 MED ORDER — ERLOTINIB HCL 150 MG PO TABS: 150.0000 mg | ORAL_TABLET | Freq: Every day | ORAL | Status: DC

## 2020-05-24 MED ORDER — ADULT MULTIVITAMIN W/MINERALS CH
1.0000 | ORAL_TABLET | Freq: Every day | ORAL | Status: DC
  Administered 2020-05-25 – 2020-05-26 (×2): 1 via ORAL
  Filled 2020-05-24 (×2): qty 1

## 2020-05-24 MED ORDER — HEPARIN (PORCINE) 25000 UT/250ML-% IV SOLN
900.0000 [IU]/h | INTRAVENOUS | Status: DC
  Administered 2020-05-24 (×3): 900 [IU]/h via INTRAVENOUS
  Filled 2020-05-24: qty 250

## 2020-05-24 MED ORDER — LORATADINE 10 MG PO TABS
10.0000 mg | ORAL_TABLET | Freq: Every day | ORAL | Status: DC
  Administered 2020-05-25 – 2020-05-26 (×2): 10 mg via ORAL
  Filled 2020-05-24 (×2): qty 1

## 2020-05-24 MED ORDER — SODIUM CHLORIDE 0.9% FLUSH: 3.0000 mL | INTRAVENOUS | Status: DC | PRN

## 2020-05-24 MED ORDER — MORPHINE SULFATE (PF) 4 MG/ML IV SOLN
INTRAVENOUS | Status: AC
  Administered 2020-05-24: 4 mg
  Filled 2020-05-24: qty 1

## 2020-05-24 MED ORDER — ACETAMINOPHEN 325 MG PO TABS: 650.0000 mg | ORAL_TABLET | ORAL | Status: DC | PRN

## 2020-05-24 MED ORDER — TICAGRELOR 90 MG PO TABS
90.0000 mg | ORAL_TABLET | Freq: Two times a day (BID) | ORAL | Status: DC
  Administered 2020-05-25: 90 mg via ORAL
  Filled 2020-05-24 (×2): qty 1

## 2020-05-24 MED ORDER — HYDRALAZINE HCL 20 MG/ML IJ SOLN
INTRAMUSCULAR | Status: AC
  Filled 2020-05-24: qty 1

## 2020-05-24 MED ORDER — FENTANYL CITRATE (PF) 100 MCG/2ML IJ SOLN
INTRAMUSCULAR | Status: AC
  Filled 2020-05-24: qty 2

## 2020-05-24 MED ORDER — LOSARTAN POTASSIUM 50 MG PO TABS
100.0000 mg | ORAL_TABLET | Freq: Every day | ORAL | Status: DC
  Administered 2020-05-25 – 2020-05-26 (×2): 100 mg via ORAL
  Filled 2020-05-24 (×2): qty 2

## 2020-05-24 MED ORDER — HEPARIN SODIUM (PORCINE) 1000 UNIT/ML IJ SOLN
INTRAMUSCULAR | Status: DC | PRN
  Administered 2020-05-24 (×2): 4000 [IU] via INTRAVENOUS

## 2020-05-24 MED ORDER — LIDOCAINE HCL (PF) 1 % IJ SOLN
INTRAMUSCULAR | Status: AC
  Filled 2020-05-24: qty 30

## 2020-05-24 MED ORDER — HEPARIN (PORCINE) IN NACL 1000-0.9 UT/500ML-% IV SOLN
INTRAVENOUS | Status: DC | PRN
  Administered 2020-05-24 (×2): 500 mL

## 2020-05-24 MED ORDER — SODIUM CHLORIDE 0.9 % WEIGHT BASED INFUSION: 1.0000 mL/kg/h | INTRAVENOUS | Status: AC

## 2020-05-24 MED ORDER — HEPARIN SODIUM (PORCINE) 5000 UNIT/ML IJ SOLN
INTRAMUSCULAR | Status: AC
  Filled 2020-05-24: qty 1

## 2020-05-24 MED ORDER — TICAGRELOR 90 MG PO TABS
ORAL_TABLET | ORAL | Status: AC
  Filled 2020-05-24: qty 2

## 2020-05-24 MED ORDER — NITROGLYCERIN 0.4 MG SL SUBL
SUBLINGUAL_TABLET | SUBLINGUAL | Status: AC
  Filled 2020-05-24: qty 1

## 2020-05-24 MED ORDER — SODIUM CHLORIDE 0.9 % IV SOLN
INTRAVENOUS | Status: AC | PRN
  Administered 2020-05-24: 73 mL/h via INTRAVENOUS

## 2020-05-24 MED ORDER — HYDROCODONE-ACETAMINOPHEN 7.5-325 MG PO TABS
1.0000 | ORAL_TABLET | Freq: Four times a day (QID) | ORAL | Status: DC | PRN
  Administered 2020-05-24: 1 via ORAL
  Filled 2020-05-24: qty 1

## 2020-05-24 MED ORDER — SODIUM CHLORIDE 0.9% FLUSH
3.0000 mL | Freq: Two times a day (BID) | INTRAVENOUS | Status: DC
  Administered 2020-05-25 (×3): 3 mL via INTRAVENOUS

## 2020-05-24 MED ORDER — ONDANSETRON HCL 4 MG/2ML IJ SOLN: 4.0000 mg | Freq: Four times a day (QID) | INTRAMUSCULAR | Status: DC | PRN

## 2020-05-24 MED ORDER — NITROGLYCERIN 0.4 MG SL SUBL: 0.4000 mg | SUBLINGUAL_TABLET | SUBLINGUAL | Status: DC | PRN

## 2020-05-24 MED ORDER — ASCORBIC ACID 500 MG PO TABS
500.0000 mg | ORAL_TABLET | Freq: Every day | ORAL | Status: DC
  Administered 2020-05-25 – 2020-05-26 (×2): 500 mg via ORAL
  Filled 2020-05-24 (×2): qty 1

## 2020-05-24 MED ORDER — HEPARIN SODIUM (PORCINE) 1000 UNIT/ML IJ SOLN
INTRAMUSCULAR | Status: AC
  Filled 2020-05-24: qty 1

## 2020-05-24 MED ORDER — ASPIRIN 81 MG PO CHEW
324.0000 mg | CHEWABLE_TABLET | Freq: Once | ORAL | Status: AC
  Administered 2020-05-24: 324 mg via ORAL
  Filled 2020-05-24: qty 4

## 2020-05-24 MED ORDER — ENOXAPARIN SODIUM 40 MG/0.4ML ~~LOC~~ SOLN
40.0000 mg | SUBCUTANEOUS | Status: DC
  Administered 2020-05-26: 40 mg via SUBCUTANEOUS
  Filled 2020-05-24 (×2): qty 0.4

## 2020-05-24 MED ORDER — MIDAZOLAM HCL 2 MG/2ML IJ SOLN
INTRAMUSCULAR | Status: DC | PRN
  Administered 2020-05-24 (×3): 1 mg via INTRAVENOUS

## 2020-05-24 MED ORDER — IOHEXOL 350 MG/ML SOLN
INTRAVENOUS | Status: DC | PRN
  Administered 2020-05-24: 110 mL via INTRA_ARTERIAL

## 2020-05-24 MED ORDER — ONDANSETRON HCL 4 MG/2ML IJ SOLN
INTRAMUSCULAR | Status: AC
  Filled 2020-05-24: qty 2

## 2020-05-24 SURGICAL SUPPLY — 17 items
BALLN SAPPHIRE 2.5X12 (BALLOONS) ×2
BALLN SAPPHIRE ~~LOC~~ 3.25X15 (BALLOONS) ×1 IMPLANT
BALLOON SAPPHIRE 2.5X12 (BALLOONS) IMPLANT
CATH 5FR JL3.5 JR4 ANG PIG MP (CATHETERS) ×1 IMPLANT
CATH VISTA GUIDE 6FR XBLAD3.5 (CATHETERS) ×1 IMPLANT
DEVICE RAD COMP TR BAND LRG (VASCULAR PRODUCTS) ×1 IMPLANT
GLIDESHEATH SLEND SS 6F .021 (SHEATH) ×1 IMPLANT
GUIDEWIRE INQWIRE 1.5J.035X260 (WIRE) IMPLANT
INQWIRE 1.5J .035X260CM (WIRE) ×2
KIT ENCORE 26 ADVANTAGE (KITS) ×1 IMPLANT
KIT HEART LEFT (KITS) ×2 IMPLANT
PACK CARDIAC CATHETERIZATION (CUSTOM PROCEDURE TRAY) ×2 IMPLANT
SHEATH PROBE COVER 6X72 (BAG) ×1 IMPLANT
STENT RESOLUTE ONYX 3.0X22 (Permanent Stent) ×1 IMPLANT
TRANSDUCER W/STOPCOCK (MISCELLANEOUS) ×2 IMPLANT
TUBING CIL FLEX 10 FLL-RA (TUBING) ×2 IMPLANT
WIRE ASAHI PROWATER 180CM (WIRE) ×2 IMPLANT

## 2020-05-24 NOTE — Progress Notes (Signed)
ANTICOAGULATION CONSULT NOTE - Initial Consult  Pharmacy Consult for heparin Indication: chest pain/ACS  Allergies  Allergen Reactions  . Avelox [Moxifloxacin Hcl In Nacl] Hives, Shortness Of Breath and Swelling  . Medical Adhesive Remover Dermatitis and Rash  . Tape Dermatitis and Rash    Patient Measurements: Height: 6\' 1"  (185.4 cm) Weight: 72.6 kg (159 lb 15.8 oz) IBW/kg (Calculated) : 79.9 Heparin Dosing Weight: 73kg  Vital Signs: BP: 106/80 (12/06 1059) Pulse Rate: 102 (12/06 1059)  Labs: Recent Labs    05/24/20 1220  HGB 16.1  HCT 46.9  PLT 291    CrCl cannot be calculated (Patient's most recent lab result is older than the maximum 21 days allowed.).   Medical History: Past Medical History:  Diagnosis Date  . Allergic rhinitis   . Arthritis    "all over my body" (10/16/2013)  . Chronic back pain    "neck to lower back" (10/16/2013)  . Compression fracture    S/P L-SPINE; pt does not recall this hx on 10/16/2013  . COPD (chronic obstructive pulmonary disease) (HCC)    FVC .66  . Depression    "situational since 09/22/2013 OR"  . Encounter for long-term (current) use of other medications   . GERD (gastroesophageal reflux disease)   . Hemoptysis   . 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 History of blood transfusion 09/2013   "think so; not sure; related to big OR"  . History of surgical fusion joint    DDD C-SPINE  . Hyperlipidemia   . Hypertension   . Leiomyoma OF SKIN   INCREASED RISK RENAL CELL CA  NEEDS CT OF KIDNEYS EVERY 2 YEARS NEXT DUE  02/06/13  . Melanoma in situ ([place])   . Melanoma of back ([place])   . Neuromuscular disorder ([place])   . Osteoarthritis   . Renal calculi   . Renal cell carcinoma ([place])    type 2; papillary  . Tobacco use disorder   . Unspecified vitamin D deficiency     Assessment: 64 year old male presents to [facility] with chest pain, plan to transfer to [facility] for catheter. New orders for heparin. CBC appears  normal.   Goal of Therapy:  Heparin level 0.3-0.7 units/ml Monitor platelets by anticoagulation protocol: Yes   Plan:  Give 4000 units bolus x 1 Start heparin infusion at 900 units/hr Check anti-Xa level in 6 hours and daily while on heparin Continue to monitor H&H and platelets  Erin Hearing PharmD., BCPS Clinical Pharmacist 05/24/2020 12:42 PM

## 2020-05-24 NOTE — ED Notes (Signed)
ED Provider at bedside. 

## 2020-05-24 NOTE — Progress Notes (Signed)
Patient takes hydrocodone 7.5mg  x 2 q 4 hours for end stage should pain.   Post cath reported SOB, diaphoresis, sinus drainage, cough and tachycardia. Given hydrocodone 7.5mg  then patient had nausea and cough up blood. Symptoms improved after zofran.   He got loading dose of Brilliant. ? bronchospasm or  narcotic withdrawal.   The patient was seen and examined by Dr. Harl Bowie as well.   Will change Briliinta to Plavix tomorrow. He has hx of COPD. No wheezing. Give home Trazodone to help him sleep.   He got Brilinta loading dose at 5pm.  Give loading dose of Plavix 300mg  in AM then 75mg  daily.   Per patient, his wife talk with [facility] and advised to hold his Tarceva. Will hold given tonight.

## 2020-05-24 NOTE — Progress Notes (Signed)
Dr. Kalman Shan here to look at patient. Resting quietly.

## 2020-05-24 NOTE — ED Notes (Signed)
Heparin stopped again as it kept beeping; tubed was changed, machine was changed.

## 2020-05-24 NOTE — ED Notes (Signed)
meds pulled from pyxis per request of [facility], ASA 324mg  chewable (81mg  X 4) pulled, SL Ntg pulled and Heparin vial of 5000 U pulled, [facility] states meds will be documented in their notes

## 2020-05-24 NOTE — ED Notes (Signed)
Heparin gtt was stopped to changed pump as it kept beeping.

## 2020-05-24 NOTE — Progress Notes (Signed)
Coughing spell. Coughed up moderate amount of dark red blood onto floor w/ some clots. Paging Robbie Lis, [place]

## 2020-05-24 NOTE — Progress Notes (Incomplete)
Patient history of renal cell cancer with bone mets admitted with anterior STEMI, echo is pending. Post procedure significant coughing and some SOB. He does have history of COPD, was loaded with brillinta prior to procedure.    Patient s/p DES to LAD today, normal LVEDP during cath.

## 2020-05-24 NOTE — ED Notes (Signed)
Trop >27000, correction from lab called Charge RN at [facility] ED, Judson Roch, and made her aware, also made cath lab aware.

## 2020-05-24 NOTE — Telephone Encounter (Signed)
Called Dr. Lorelei Pont back about message. She wanted to let [name] know that patient had some EKG changes and had Abdominal and chest pain. She sent patient to the ED to get evaluated. [name] is aware.

## 2020-05-24 NOTE — ED Notes (Signed)
Aaron Edelman in the Cath Lab made aware of Trop >24000

## 2020-05-24 NOTE — Telephone Encounter (Signed)
Dr. Lorelei Pont calling to speak with [name] in regards to this patient.  Called DOD phone several times with no answer. Called Triage with no answer per Jeneen Rinks and sent a Teams to Dr. Kyla Balzarine nurse. Dr. Lorelei Pont ended up hanging up. Unable to get a callback number, she called from a personal number.

## 2020-05-24 NOTE — ED Notes (Signed)
Called Cardiologist to know POC. Wannetta Sender said that pt is going to cath lab. Informed Trish that heparin gtt was briefly stopped due to faulty tube.

## 2020-05-24 NOTE — ED Provider Notes (Signed)
[facility] EMERGENCY DEPARTMENT Provider Note   CSN: [id number] Arrival date & time: 05/24/20  1206     History Chief Complaint  Patient presents with  . Chest Pain    Cristian Davitt is a 64 y.o. male.  Patient is a 64 year old male with extensive past medical history including COPD, hereditary leiomyomatosis and renal cell cancer, hypertension, hyperlipidemia. Patient is referred here from the [facility] clinic for evaluation of epigastric pain. Patient has been experiencing episodes of epigastric discomfort for the past 2 weeks. This seems to occur every few days. He describes pressure to the lower part of his sternum/epigastric region that makes him feel short of breath and as if he needs to have a bowel movement. He sits on the toilet, however does not have any relief. He denies any cough, fevers, or chills.  The history is provided by the patient.  Chest Pain Pain location:  Epigastric Pain quality: tightness   Pain radiates to:  Does not radiate Pain severity:  Moderate Duration:  2 weeks Timing:  Intermittent Progression:  Worsening Chronicity:  New Relieved by:  Nothing Worsened by:  Nothing Ineffective treatments:  None tried      Past Medical History:  Diagnosis Date  . Allergic rhinitis   . Arthritis    "all over my body" (10/16/2013)  . Chronic back pain    "neck to lower back" (10/16/2013)  . Compression fracture    S/P L-SPINE; pt does not recall this hx on 10/16/2013  . COPD (chronic obstructive pulmonary disease) (HCC)    FVC .66  . Depression    "situational since 09/22/2013 OR"  . Encounter for long-term (current) use of other medications   . GERD (gastroesophageal reflux disease)   . Hemoptysis   . 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 History of blood transfusion 09/2013   "think so; not sure; related to big OR"  . History of surgical fusion joint    DDD C-SPINE  . Hyperlipidemia   . Hypertension   .  Leiomyoma OF SKIN   INCREASED RISK RENAL CELL CA  NEEDS CT OF KIDNEYS EVERY 2 YEARS NEXT DUE  02/06/13  . Melanoma in situ ([place])   . Melanoma of back ([place])   . Neuromuscular disorder ([place])   . Osteoarthritis   . Renal calculi   . Renal cell carcinoma ([place])    type 2; papillary  . Tobacco use disorder   . Unspecified vitamin D deficiency     Patient Active Problem List   Diagnosis Date Noted  . Bone metastases ([place]) 07/26/2016  . CAD (coronary artery disease) 03/25/2014  . Protein-calorie malnutrition, severe ([place]) 10/17/2013  . 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 Renal calculi   . GERD (gastroesophageal reflux disease)   . Allergic rhinitis   . Osteoarthritis   . Encounter for long-term (current) use of other medications   . Hyperlipidemia   . Tobacco use disorder   . COPD (chronic obstructive pulmonary disease) ([place])   . Unspecified vitamin D deficiency   . Hypertension     Past Surgical History:  Procedure Laterality Date  . ANTERIOR CERVICAL DECOMP/DISCECTOMY FUSION  1992  . ANTERIOR CRUCIATE LIGAMENT REPAIR Bilateral [phone]  . APPENDECTOMY  09/22/2013  . CHOLECYSTECTOMY  09/22/2013   "had tumors in it"  . HAND LIGAMENT RECONSTRUCTION Right   . HARVEST BONE GRAFT Right 1992   "hip; for neck fusion"  .  INGUINAL HERNIA REPAIR Right 2004  . LIGAMENT REPAIR Right ~ 2001   "little finger"  . LYMPH NODE DISSECTION Right 09/22/2013   "in the area of the kidney; they were clean"  . MELANOMA EXCISION  ~ 2007   "off my back"  . PARTIAL NEPHRECTOMY Right 09/22/2013  . removal of fascia overlying the right psoas Right 09/22/2013  . RENAL ARTERY STENT Right 09/22/2013  . RIGHT COLECTOMY Right 09/22/2013  . ROBOTIC ASSITED PARTIAL NEPHRECTOMY  05/22/2012   Procedure: ROBOTIC ASSITED PARTIAL NEPHRECTOMY;  Surgeon: Alexis Frock, MD;  Location: [facility];  Service: Urology;  Laterality: Right;  Right Robotic Cyst Decortication and Partial  Nephrectomy   . SPINE SURGERY         Family History  Adopted: Yes  Problem Relation Age of Onset  . Hypertension Father     Social History   Tobacco Use  . Smoking status: Former Smoker    Packs/day: 0.50    Years: 40.00    Pack years: 20.00    Types: Cigarettes  . Smokeless tobacco: Never Used  . Tobacco comment: Pt chewing Nicorette gum currently  Vaping Use  . Vaping Use: Never used  Substance Use Topics  . Alcohol use: Yes    Alcohol/week: 0.0 standard drinks    Comment: 10/16/2013 "2-3 beers daily generally; nothing last month or so"  . Drug use: Yes    Types: Marijuana    Home Medications Prior to Admission medications   Medication Sig Start Date End Date Taking? Authorizing Provider  atorvastatin (LIPITOR) 10 MG tablet TAKE 1 TABLET BY MOUTH EVERYDAY AT BEDTIME 07/17/19   Copland, Gay Filler, MD  B Complex Vitamins (B COMPLEX 100 PO) Take 100 mg by mouth daily.    [provider]  Bevacizumab (AVASTIN) 100 MG/4ML SOLN Inject into the vein.    [provider]  cetirizine (ZYRTEC) 10 MG tablet Take 5 mg by mouth daily.     [provider]  denosumab (XGEVA) 120 MG/1.7ML SOLN injection Inject 120 mg into the skin every 30 (thirty) days.     [provider]  doxycycline (ADOXA) 50 MG tablet Take 50 mg by mouth daily.    [provider]  DULoxetine (CYMBALTA) 20 MG capsule TAKE 1 CAPSULE BY MOUTH EVERY DAY 10/30/19   Copland, Gay Filler, MD  erlotinib (TARCEVA) 150 MG tablet Take 150 mg by mouth daily. Take on an empty stomach 1 hour before meals or 2 hours after    [provider]  HYDROcodone-acetaminophen (NORCO) 7.5-325 MG tablet Take 1-2 tablets every 4-6 hours as needed for pain. 05/11/20   Copland, Gay Filler, MD  lidocaine (LIDODERM) 5 % Place 1 patch onto the skin daily. Remove & Discard patch within 12 hours or as directed by MD.  May wear up to 3 patches at a time 08/09/17   Copland, Gay Filler, MD  loperamide  (IMODIUM A-D) 2 MG tablet Take 2 mg by mouth 4 (four) times daily as needed for diarrhea or loose stools.    [provider]  LORazepam (ATIVAN) 1 MG tablet Take 0.5-1 tablets (0.5-1 mg total) by mouth at bedtime as needed for sleep. TAKE ONE HALF OR ONE TABLET BY MOUTH AT BEDTIME IF NEEDED FOR SLEEP, DO NOT TAKE WITH PAIN MEDS 12/10/19   Copland, Gay Filler, MD  losartan (COZAAR) 100 MG tablet Take by mouth.    [provider]  Multiple Vitamins-Minerals (CENTRUM SILVER 50+MEN PO) Take 1 tablet by  mouth daily.    [provider]  NIFEdipine (ADALAT CC) 60 MG 24 hr tablet Take 120 mg by mouth daily.    [provider]  ondansetron (ZOFRAN-ODT) 4 MG disintegrating tablet TAKE 1 TABLET BY MOUTH SUBLINGUALLY EVERY 8 HOURS AS NEEDED FOR NAUSEA OR VOMITING 05/11/20   Copland, Gay Filler, MD  pantoprazole (PROTONIX) 20 MG tablet Take 20 mg by mouth daily.    [provider]  traZODone (DESYREL) 50 MG tablet TAKE 0.5-1 TABLETS BY MOUTH AT BEDTIME AS NEEDED FOR SLEEP. 05/21/20   Copland, Gay Filler, MD  UNABLE TO FIND Take 1 tablet by mouth daily. Med Name: Calcium 600/D-3 300 2 daily     [provider]  vitamin C (ASCORBIC ACID) 500 MG tablet Take 500 mg by mouth daily.    [provider]    Allergies    Avelox [moxifloxacin hcl in nacl], Medical adhesive remover, and Tape  Review of Systems   Review of Systems  Cardiovascular: Positive for chest pain.  All other systems reviewed and are negative.   Physical Exam Updated Vital Signs Ht 6\' 1"  (1.854 m)   Wt 72.6 kg   BMI 21.11 kg/m   Physical Exam Vitals and nursing note reviewed.  Constitutional:      General: He is not in acute distress.    Appearance: He is well-developed. He is not diaphoretic.  HENT:     Head: Normocephalic and atraumatic.  Cardiovascular:     Rate and Rhythm: Normal rate and regular rhythm.     Heart sounds: No murmur heard.  No friction rub.  Pulmonary:      Effort: Pulmonary effort is normal. No respiratory distress.     Breath sounds: Normal breath sounds. No wheezing or rales.  Abdominal:     General: Bowel sounds are normal. There is no distension.     Palpations: Abdomen is soft.     Tenderness: There is no abdominal tenderness.  Musculoskeletal:        General: Normal range of motion.     Cervical back: Normal range of motion and neck supple.  Skin:    General: Skin is warm and dry.  Neurological:     Mental Status: He is alert and oriented to person, place, and time.     Coordination: Coordination normal.     ED Results / Procedures / Treatments   Labs (all labs ordered are listed, but only abnormal results are displayed) Labs Reviewed  CBC WITH DIFFERENTIAL/PLATELET - Abnormal; Notable for the following components:      Result Value   WBC 15.1 (*)    Neutro Abs 11.5 (*)    Monocytes Absolute 1.4 (*)    All other components within normal limits  RESP PANEL BY RT-PCR (FLU A&B, COVID) [id number]  COMPREHENSIVE METABOLIC PANEL  PROTIME-INR  TROPONIN I (HIGH SENSITIVITY)    EKG EKG Interpretation  Date/Time:  Monday May 24 2020 12:13:24 EST Ventricular Rate:  64 PR Interval:    QRS Duration: 83 QT Interval:  353 QTC Calculation: 365 R Axis:   61 Text Interpretation: Sinus rhythm Probable anterolateral infarct, acute Minimal ST elevation, inferior leads >>> Acute MI <<< Confirmed by Veryl Speak [id number]) on 05/24/2020 12:22:12 PM   Radiology No results found.  Procedures Procedures (including critical care time)  Medications Ordered in ED Medications  aspirin chewable tablet 324 mg (has no administration in time range)  heparin 5000 UNIT/ML injection (has no administration in time range)  nitroGLYCERIN (NITROSTAT) 0.4 MG SL tablet (has no administration in time range)  heparin ADULT infusion 100 units/mL (25000 units/210mL sodium chloride 0.45%) (has no administration in time range)  heparin bolus via  infusion 4,000 Units (has no administration in time range)  morphine 4 MG/ML injection (4 mg  Given 05/24/20 1244)    ED Course  I have reviewed the triage vital signs and the nursing notes.  Pertinent labs & imaging results that were available during my care of the patient were reviewed by me and considered in my medical decision making (see chart for details).    MDM Rules/Calculators/A&P  Patient sent to the ER from [facility] clinic for evaluation of epigastric discomfort and abnormal EKG. Patient's EKG repeated on arrival to the ED showing what appears to be an anterior wall ST segment elevation myocardial infarction. Code STEMI initiated. Care discussed with Dr. Martinique from cardiology. Patient to be sent emergently for cardiac cath. Laboratory studies pending at this time. He will be started on heparin and baby aspirin. Patient tells me he is currently pain-free.  Patient taking Avastin. I spoke with his nurse with the [facility] who is overseeing his cancer treatment. She has informed me that blood thinners including heparin and aspirin are okay with this medication, but the Avastin may delay healing.  CRITICAL CARE Performed by: Veryl Speak Total critical care time: 35 minutes Critical care time was exclusive of separately billable procedures and treating other patients. Critical care was necessary to treat or prevent imminent or life-threatening deterioration. Critical care was time spent personally by me on the following activities: development of treatment plan with patient and/or surrogate as well as nursing, discussions with consultants, evaluation of patient's response to treatment, examination of patient, obtaining history from patient or surrogate, ordering and performing treatments and interventions, ordering and review of laboratory studies, ordering and review of radiographic studies, pulse oximetry and re-evaluation of patient's condition.   Final Clinical Impression(s) / ED  Diagnoses Final diagnoses:  None    Rx / DC Orders ED Discharge Orders    None       Veryl Speak, MD 05/24/20 1250

## 2020-05-24 NOTE — Progress Notes (Signed)
Emotional, teary; spoke w/wife on phone. Coughing subsided much. Blowing nose; small nose bleed. Encouraged patient not to blow nose. Ambulated to BR. Tolerated well. Back to stretcher.

## 2020-05-24 NOTE — ED Provider Notes (Signed)
Update note  64 year old male with STEMI.  Transfer from [facility] [place].  See Dr. Estanislado Pandy note for full ED provider note.  Cardiology evaluated patient in ER and they have decided to wait to take him to the Cath Lab.  They will admit patient and will continue management.  I assessed patient in ER, vital signs are stable and patient is well-appearing. Patient does not have any pain at this time.  Repeat EKG consistent with acute MI.    Lucrezia Starch, MD 05/24/20 1331

## 2020-05-24 NOTE — H&P (Addendum)
Cardiology Admission History and Physical:   Patient ID: Douglas [name] MRN: [id number]; DOB: April 29, 1956   Admission date: 05/24/2020  Primary Care Provider: Darreld Mclean, MD [facility] Cardiologist: Jenkins Rouge, MD   Chief Complaint: CP   Patient Profile:   Douglas [name] is a 64 y.o. male with hx of hereditary leiomyomatosis and renal cell cancer metastatic to bone (he is in [facility] clinical trials for 61 months and sees oncology at [facility] and [facility]), HTN, HLD, PVD, and chronic L shoulder pain transfer from [place] with possible STEMI.  History of coronary calcification on CT.  Had normal ETT in 2015 and 2017. Seen by Dr Trula Slade 03/11/18 for mesenteric ischemia.   His cancer drugs are Tarceva, Avastin and Xgeva.  Last seen by [name] 04/09/2020. Had normal ETT 05/11/20.  History of Present Illness:   Douglas [name] with history of intermittent epigastric discomfort.  Described as a dull achy pressure.  Progressively worsened.  Yesterday he had a worse episode around 2:30 PM.  He described episode as dull achy constant pressure around low sternum/epigastric area.  Lasted for hours or so.  Radiating to his back and between shoulder.  His pain eventually subsided around midnight.  He has known hernia.  Due to symptoms he was seen by PCP and EKG concerning for ST elevation and he was transferred to [facility].  Aspirin 324 mg, morphine and heparin bolus.  He transferred to [facility] with minimal ST elevation in lead V2 V4 with code STEMI.  He was not taken to cardiac catheterization directly due to concern for bleeding with his cancer medications.   Dr. Ellyn Hack and I have spoken directly with [facility] nurse and MD (# [phone]).  Per MD, okay to start anticoagulation and he can be on dual antiplatelet therapy.  Patient was epigastric/lower sternal pain free in emergency department.  He was complaining of left shoulder pain which he attributes to his  chronic shoulder arthritis.  He takes hydrocodone every 4 hours. He is worried about his cancer drug which may end up being stopping.   Past Medical History:  Diagnosis Date  . Allergic rhinitis   . Arthritis    "all over my body" (10/16/2013)  . Chronic back pain    "neck to lower back" (10/16/2013)  . Compression fracture    S/P L-SPINE; pt does not recall this hx on 10/16/2013  . COPD (chronic obstructive pulmonary disease) (HCC)    FVC .66  . Depression    "situational since 09/22/2013 OR"  . Encounter for long-term (current) use of other medications   . GERD (gastroesophageal reflux disease)   . Hemoptysis   . 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 History of blood transfusion 09/2013   "think so; not sure; related to big OR"  . History of surgical fusion joint    DDD C-SPINE  . Hyperlipidemia   . Hypertension   . Leiomyoma OF SKIN   INCREASED RISK RENAL CELL CA  NEEDS CT OF KIDNEYS EVERY 2 YEARS NEXT DUE  02/06/13  . Melanoma in situ ([place])   . Melanoma of back ([place])   . Neuromuscular disorder ([place])   . Osteoarthritis   . Renal calculi   . Renal cell carcinoma ([place])    type 2; papillary  . Tobacco use disorder   . Unspecified vitamin D deficiency     Past Surgical History:  Procedure Laterality Date  . ANTERIOR CERVICAL DECOMP/DISCECTOMY  FUSION  1992  . ANTERIOR CRUCIATE LIGAMENT REPAIR Bilateral [phone]  . APPENDECTOMY  09/22/2013  . CHOLECYSTECTOMY  09/22/2013   "had tumors in it"  . HAND LIGAMENT RECONSTRUCTION Right   . HARVEST BONE GRAFT Right 1992   "hip; for neck fusion"  . INGUINAL HERNIA REPAIR Right 2004  . LIGAMENT REPAIR Right ~ 2001   "little finger"  . LYMPH NODE DISSECTION Right 09/22/2013   "in the area of the kidney; they were clean"  . MELANOMA EXCISION  ~ 2007   "off my back"  . PARTIAL NEPHRECTOMY Right 09/22/2013  . removal of fascia overlying the right psoas Right 09/22/2013  . RENAL ARTERY STENT Right  09/22/2013  . RIGHT COLECTOMY Right 09/22/2013  . ROBOTIC ASSITED PARTIAL NEPHRECTOMY  05/22/2012   Procedure: ROBOTIC ASSITED PARTIAL NEPHRECTOMY;  Surgeon: Alexis Frock, MD;  Location: [facility];  Service: Urology;  Laterality: Right;  Right Robotic Cyst Decortication and Partial Nephrectomy   . SPINE SURGERY       Medications Prior to Admission: Prior to Admission medications   Medication Sig Start Date End Date Taking? Authorizing Provider  atorvastatin (LIPITOR) 10 MG tablet TAKE 1 TABLET BY MOUTH EVERYDAY AT BEDTIME 07/17/19   Copland, Gay Filler, MD  B Complex Vitamins (B COMPLEX 100 PO) Take 100 mg by mouth daily.    [provider]  Bevacizumab (AVASTIN) 100 MG/4ML SOLN Inject into the vein.    [provider]  cetirizine (ZYRTEC) 10 MG tablet Take 5 mg by mouth daily.     [provider]  denosumab (XGEVA) 120 MG/1.7ML SOLN injection Inject 120 mg into the skin every 30 (thirty) days.     [provider]  doxycycline (ADOXA) 50 MG tablet Take 50 mg by mouth daily.    [provider]  DULoxetine (CYMBALTA) 20 MG capsule TAKE 1 CAPSULE BY MOUTH EVERY DAY 10/30/19   Copland, Gay Filler, MD  erlotinib (TARCEVA) 150 MG tablet Take 150 mg by mouth daily. Take on an empty stomach 1 hour before meals or 2 hours after    [provider]  HYDROcodone-acetaminophen (NORCO) 7.5-325 MG tablet Take 1-2 tablets every 4-6 hours as needed for pain. 05/11/20   Copland, Gay Filler, MD  lidocaine (LIDODERM) 5 % Place 1 patch onto the skin daily. Remove & Discard patch within 12 hours or as directed by MD.  May wear up to 3 patches at a time 08/09/17   Copland, Gay Filler, MD  loperamide (IMODIUM A-D) 2 MG tablet Take 2 mg by mouth 4 (four) times daily as needed for diarrhea or loose stools.    [provider]  LORazepam (ATIVAN) 1 MG tablet Take 0.5-1 tablets (0.5-1 mg total) by mouth at bedtime as needed for sleep. TAKE ONE HALF OR ONE TABLET BY MOUTH AT  BEDTIME IF NEEDED FOR SLEEP, DO NOT TAKE WITH PAIN MEDS 12/10/19   Copland, Gay Filler, MD  losartan (COZAAR) 100 MG tablet Take by mouth.    [provider]  Multiple Vitamins-Minerals (CENTRUM SILVER 50+MEN PO) Take 1 tablet by mouth daily.    [provider]  NIFEdipine (ADALAT CC) 60 MG 24 hr tablet Take 120 mg by mouth daily.    [provider]  ondansetron (ZOFRAN-ODT) 4 MG disintegrating tablet TAKE 1 TABLET BY MOUTH SUBLINGUALLY EVERY 8 HOURS AS NEEDED FOR NAUSEA OR VOMITING 05/11/20   Copland, Gay Filler, MD  pantoprazole (PROTONIX) 20 MG tablet Take 20 mg  by mouth daily.    [provider]  traZODone (DESYREL) 50 MG tablet TAKE 0.5-1 TABLETS BY MOUTH AT BEDTIME AS NEEDED FOR SLEEP. 05/21/20   Copland, Gay Filler, MD  UNABLE TO FIND Take 1 tablet by mouth daily. Med Name: Calcium 600/D-3 300 2 daily     [provider]  vitamin C (ASCORBIC ACID) 500 MG tablet Take 500 mg by mouth daily.    [provider]     Allergies:    Allergies  Allergen Reactions  . Avelox [Moxifloxacin Hcl In Nacl] Hives, Shortness Of Breath and Swelling  . Medical Adhesive Remover Dermatitis and Rash  . Tape Dermatitis and Rash    Social History:   Social History   Socioeconomic History  . Marital status: Married    Spouse name: Not on file  . Number of children: Not on file  . Years of education: Not on file  . Highest education level: Not on file  Occupational History  . Occupation: Scientist, clinical (histocompatibility and immunogenetics): [employer]  Tobacco Use  . Smoking status: Former Smoker    Packs/day: 0.50    Years: 40.00    Pack years: 20.00    Types: Cigarettes  . Smokeless tobacco: Never Used  . Tobacco comment: Pt chewing Nicorette gum currently  Vaping Use  . Vaping Use: Never used  Substance and Sexual Activity  . Alcohol use: Yes    Alcohol/week: 0.0 standard drinks    Comment: 10/16/2013 "2-3 beers daily generally; nothing last month or so"  . Drug  use: Yes    Types: Marijuana  . Sexual activity: Not Currently  Other Topics Concern  . Not on file  Social History Narrative   Walks daily for exercise.   Social Determinants of Health   Financial Resource Strain:   . Difficulty of Paying Living Expenses: Not on file  Food Insecurity:   . Worried About Charity fundraiser in the Last Year: Not on file  . Ran Out of Food in the Last Year: Not on file  Transportation Needs:   . Lack of Transportation (Medical): Not on file  . Lack of Transportation (Non-Medical): Not on file  Physical Activity:   . Days of Exercise per Week: Not on file  . Minutes of Exercise per Session: Not on file  Stress:   . Feeling of Stress : Not on file  Social Connections:   . Frequency of Communication with Friends and Family: Not on file  . Frequency of Social Gatherings with Friends and Family: Not on file  . Attends Religious Services: Not on file  . Active Member of Clubs or Organizations: Not on file  . Attends Archivist Meetings: Not on file  . Marital Status: Not on file  Intimate Partner Violence:   . Fear of Current or Ex-Partner: Not on file  . Emotionally Abused: Not on file  . Physically Abused: Not on file  . Sexually Abused: Not on file    Family History:  The patient's family history includes Hypertension in his father. He was adopted.    ROS:  Please see the history of present illness. All other ROS reviewed and negative.     Physical Exam/Data:   Vitals:   05/24/20 1218 05/24/20 1226 05/24/20 1326 05/24/20 1403  BP: (!) 147/102  (!) 141/103 (!) 147/99  Pulse: 85  73 84  Resp: 14  18 19   Temp: 99 F (37.2 C)  98.9 F (37.2  C)   TempSrc: Oral  Oral   SpO2: 98%  99% 98%  Weight:  72.6 kg    Height:  6\' 1"  (1.854 m)     No intake or output data in the 24 hours ending 05/24/20 1516 Last 3 Weights 05/24/2020 05/24/2020 04/14/2020  Weight (lbs) 159 lb 15.8 oz 160 lb 160 lb  Weight (kg) 72.57 kg 72.576 kg 72.576  kg     Body mass index is 21.11 kg/m.  General:  Well nourished, well developed, in no acute distress HEENT: normal Lymph: no adenopathy Neck: no JVD Endocrine:  No thryomegaly Vascular: No carotid bruits; FA pulses 2+ bilaterally without bruits  Cardiac:  normal S1, S2; RRR; no murmur  Lungs:  clear to auscultation bilaterally, no wheezing, rhonchi or rales  Abd: soft, nontender, no hepatomegaly  Ext: noedema Musculoskeletal:  No deformities, BUE and BLE strength normal and equal Skin: warm and dry  Neuro:  CNs 2-12 intact, no focal abnormalities noted Psych:  Normal affect    EKG:  The ECG that was done  was personally reviewed and demonstrates ST elevation in lead V2-V5  Relevant CV Studies:  ETT 05/11/20  Blood pressure demonstrated a normal response to exercise.  There was no ST segment deviation noted during stress.  No T wave inversion was noted during stress.   Negative adequate stress test.   Laboratory Data:      Chemistry Recent Labs  Lab 05/24/20 1220  NA 137  K 3.7  CL 104  CO2 22  GLUCOSE 94  BUN 22  CREATININE 1.43*  CALCIUM 9.8  GFRNONAA 54*  ANIONGAP 11    Recent Labs  Lab 05/24/20 1220  PROT 7.1  ALBUMIN 4.0  AST 447*  ALT 91*  ALKPHOS 55  BILITOT 0.7   Hematology Recent Labs  Lab 05/24/20 1220  WBC 15.1*  RBC 4.97  HGB 16.1  HCT 46.9  MCV 94.4  MCH 32.4  MCHC 34.3  RDW 13.5  PLT 291   Radiology/Studies:  No results found.   Assessment and Plan:   1. STEMI -Patient brought to [facility] emergency room with inferior lateral code STEMI however he did not taken to immediate cardiac catheterization because needed clarification regarding his cancer drug as well as he was chest/epigastric pain free.  Discussed with [facility] staff and given okay to start anticoagulation as well as dual antiplatelet therapy if needed.  There was also another non-STEMI with active chest pain delaying his case. -Patient was stable in emergency  room without pain and started on IV heparin -Pending cardiac catheterization -Suspected completed MI yesterday afternoon based on his symptoms - Hs-troponin > 27,000  2. Elevated LFTS - Suspected due to completed MI - Follow with labs  3. Renal cell cancer metastatic to bone  - Pt reported to having solitary kidney -Followed by [facility] and [facility] - He is on 3 study drug Tarceva, Avastin and Xgeva - Recently dealing with protein urea - Follow renal function closely   4. HLD - No results found for requested labs within last 8760 hours.  - Hold hold lipitor for now given elevated LFTS - Check lipid panel  5. HTN - Continue home losartan  - Hold home nifedipine for now   TIMI Risk Score for ST  Elevation MI:   The patient's TIMI risk score is 4, which indicates a 7.3% risk of all cause mortality at 30 days.   Severity of Illness: The appropriate patient status for this  patient is INPATIENT. Inpatient status is judged to be reasonable and necessary in order to provide the required intensity of service to ensure the patient's safety. The patient's presenting symptoms, physical exam findings, and initial radiographic and laboratory data in the context of their chronic comorbidities is felt to place them at high risk for further clinical deterioration. Furthermore, it is not anticipated that the patient will be medically stable for discharge from the hospital within 2 midnights of admission. The following factors support the patient status of inpatient.   " The patient's presenting symptoms include chest pain  " The worrisome physical exam findings include none " The initial radiographic and laboratory data are worrisome because of Elevated enzyme " The chronic co-morbidities include cancer, HTN, HLD   * I certify that at the point of admission it is my clinical judgment that the patient will require inpatient hospital care spanning beyond 2 midnights from the point of admission due to high  intensity of service, high risk for further deterioration and high frequency of surveillance required.*    For questions or updates, please contact [place] Please consult [email or web address] for contact info under     Jarrett Soho, PA  05/24/2020 3:16 PM    ATTENDING ATTESTATION  I have seen, examined and evaluated the patient this PM in the ER along with DouglasBhagat,PA-C.  After reviewing all the available data and chart, we discussed the patients laboratory, study & physical findings as well as symptoms in detail. I agree with his findings, examination as well as impression recommendations as per our discussion.    Very pleasant 64 year old gentleman with longstanding history of metastatic stage IV renal cancer currently being treated by physicians at the [facility].  He is followed by [name] who actually recently did a nuclear stress test on the patient.  Mr. Nelda Marseille was doing relatively well and his real state of health until yesterday afternoon of 230 he started having substernal chest discomfort.  This came and went throughout the course of the day.  He described it as low sternal and epigastric area radiating to his back. The pain finally subsided roughly midnight last night, and he was able to go to sleep.  Has not had any further chest pain.  Early this morning he went to his PCPs office where he had an EKG concerning for possible anterolateral ST elevations.  He was not actively having any chest pain, and was transferred to [facility] (which is downstairs from his PCPs office).  Upon arrival he was found to have the ST elevations and was transferred emergently to [facility] as a code STEMI.  This was despite communication with Dr. Martinique indicating that he likely has a completed infarct and there is no urgent need for transfer.  He still has minimal ST elevations in V2 through V4, but is currently pain-free.  Prior to arriving the Cath Lab his  troponin resulted at > 27,000  There was concern about potential bleeding issues involving his chemotherapy medications and immunologic meds.  There is concern for possible bleeding as well as clotting.  We discussed the potential options of care with the [facility] nurse and MD.  He indicated that in the setting of MI it would be reasonable to be on aspirin Plavix, but otherwise would prefer to avoid antiplatelet agents.  At this time, he is not actively having chest pain, so we do not need urgently taken to the Cath Lab.  He will  be brought up to the Cath Lab once he has had his initial labs drawn and COVID-19 testing done.  He will likely be cathed by Dr. Martinique.  We will need to treat lipids, but his LFTs are elevated.  We will need to see the trend (could be acute phase reaction to the MI or potentially related to his cancer medications.)  We will continue to try to attempt goal directed medical therapy and may very well end up treating acutely occluded vessel.  He will need a 2D echocardiogram and close monitoring.  Case was discussed with Dr. Martinique who will be performing the Cath.     Glenetta Hew, M.D., M.S. Interventional Cardiologist   Pager # [phone] Phone # [phone] [address] [place], [place] [address]

## 2020-05-24 NOTE — ED Notes (Signed)
Wife signed transport form for husband r/t emergent patient

## 2020-05-24 NOTE — ED Triage Notes (Signed)
Pt arrives with c/o chest/abdominal pain starting yesterday around 1430, reports pain at "eased up around midnight". Pt was seen at Dr Copeland's office this morning, they preformed a EKG and sent patient down for concern over EKG changes. Pt reports [facility] for a "rare kidney cancer".

## 2020-05-24 NOTE — Progress Notes (Addendum)
Arrived from Cath lab. Rt radial TR band intact w/ old drainage and small amount of new drainage oozing. Moderate bruising around area of wrist and TR band/soft to touch. Denies numbness, Tingling , Capillary refill <3secs. Warm to touch. Denies pain at this time. Requesting Ativan for anxiety and states he takes 1mg  @ HS at home. This med is on his home med list but is not currently ordered on [facility]. Notifying MD on call via Amnion.. BP 113/87, HR 101 ST, sats 99% RA. RR 16. Denies dizziness, shortness of breath, chest pain. Fearful and anxious w/ tears in eyes asking if he is going to be okay. Education and reassurance provided. Call light in reach. Continuing to monitor.  *per report from Cath lab nurse, there is 11 cc of air remaining in TR band. Will not be removing anymore air until oozing stops. VS q 15 at this time.

## 2020-05-24 NOTE — ED Notes (Addendum)
All medications given by [facility] (ASA and heparin) - [place] RN

## 2020-05-24 NOTE — Progress Notes (Signed)
[facility] [place], [place], [place] [address] [phone]  Date:  05/24/2020   Name:  Douglas [name]   DOB:  1956/03/10   MRN:  [id number]  PCP:  Darreld Mclean, MD    Chief Complaint: Abdominal Pain (LBM today, upper adominal pain radiates to back, comes and goes) and Tinnitus (ear tingling, feels warm)   History of Present Illness:  Douglas [name] is a 64 y.o. very pleasant male patient who presents with the following:  Last seen by myself in October- Stevehas hereditaryleiomyomatosisand renal cell cancer-current treatments includeavastin, tarceva with denosumab forbone mets He is being treated in a clinical trial at [facility] for this metastatic cancer, he also does see an oncologist at [facility] Recently his treatment has been complicated by renal insufficiency and proteinuria.  He has had to forego a few of his infusion treatments due to proteinuria [name] has considerable pain from his bone mets as well as end-stage left shoulder arthritis, I am treating him with hydrocodone.  Pt here today with abd pain which he has noted this for about 6 weeks- may happen 2-3 times a week Pain came back yesterday- he feel it in the epigastrium and into the right shoulder and then across his back. Both ears may feel warm when this happen  The pain will last an hour or so- yesterday he had pain from afternoon until midnight Right now no pain- he feels "hungry"  He got worried about stomach bleeding  He has a known incisional hernia but this does not seem to be the current cause of pain No chest pressure or heaviness, no SOB No vomiting, not generally nauseated Stools have appeared normal- no blood and not black He has noted sinus drainage and cough- nothing new here  He does not have much appetite He is not sure if eating makes his sx better or worse Not using his doxycycline recently really  He is not  sure about heartburn  He was not sure if this might be gas   He is taking protonix 20 most days of the week   Wt Readings from Last 3 Encounters:  05/24/20 160 lb (72.6 kg)  04/14/20 160 lb (72.6 kg)  04/09/20 165 lb 3.2 oz (74.9 kg)   No urinary symptoms -     Patient Active Problem List   Diagnosis Date Noted  . Bone metastases ([place]) 07/26/2016  . CAD (coronary artery disease) 03/25/2014  . Protein-calorie malnutrition, severe ([place]) 10/17/2013  . 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 Renal calculi   . GERD (gastroesophageal reflux disease)   . Allergic rhinitis   . Osteoarthritis   . Encounter for long-term (current) use of other medications   . Hyperlipidemia   . Tobacco use disorder   . COPD (chronic obstructive pulmonary disease) ([place])   . Unspecified vitamin D deficiency   . Hypertension     Past Medical History:  Diagnosis Date  . Allergic rhinitis   . Arthritis    "all over my body" (10/16/2013)  . Chronic back pain    "neck to lower back" (10/16/2013)  . Compression fracture    S/P L-SPINE; pt does not recall this hx on 10/16/2013  . COPD (chronic obstructive pulmonary disease) (HCC)    FVC .66  . Depression    "situational since 09/22/2013 OR"  . Encounter for long-term (current) use of other medications   .  GERD (gastroesophageal reflux disease)   . Hemoptysis   . 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 History of blood transfusion 09/2013   "think so; not sure; related to big OR"  . History of surgical fusion joint    DDD C-SPINE  . Hyperlipidemia   . Hypertension   . Leiomyoma OF SKIN   INCREASED RISK RENAL CELL CA  NEEDS CT OF KIDNEYS EVERY 2 YEARS NEXT DUE  02/06/13  . Melanoma in situ ([place])   . Melanoma of back ([place])   . Neuromuscular disorder ([place])   . Osteoarthritis   . Renal calculi   . Renal cell carcinoma ([place])    type 2; papillary  . Tobacco use disorder   . Unspecified vitamin D  deficiency     Past Surgical History:  Procedure Laterality Date  . ANTERIOR CERVICAL DECOMP/DISCECTOMY FUSION  1992  . ANTERIOR CRUCIATE LIGAMENT REPAIR Bilateral [phone]  . APPENDECTOMY  09/22/2013  . CHOLECYSTECTOMY  09/22/2013   "had tumors in it"  . HAND LIGAMENT RECONSTRUCTION Right   . HARVEST BONE GRAFT Right 1992   "hip; for neck fusion"  . INGUINAL HERNIA REPAIR Right 2004  . LIGAMENT REPAIR Right ~ 2001   "little finger"  . LYMPH NODE DISSECTION Right 09/22/2013   "in the area of the kidney; they were clean"  . MELANOMA EXCISION  ~ 2007   "off my back"  . PARTIAL NEPHRECTOMY Right 09/22/2013  . removal of fascia overlying the right psoas Right 09/22/2013  . RENAL ARTERY STENT Right 09/22/2013  . RIGHT COLECTOMY Right 09/22/2013  . ROBOTIC ASSITED PARTIAL NEPHRECTOMY  05/22/2012   Procedure: ROBOTIC ASSITED PARTIAL NEPHRECTOMY;  Surgeon: Alexis Frock, MD;  Location: [facility];  Service: Urology;  Laterality: Right;  Right Robotic Cyst Decortication and Partial Nephrectomy   . SPINE SURGERY      Social History   Tobacco Use  . Smoking status: Former Smoker    Packs/day: 0.50    Years: 40.00    Pack years: 20.00    Types: Cigarettes  . Smokeless tobacco: Never Used  . Tobacco comment: Pt chewing Nicorette gum currently  Vaping Use  . Vaping Use: Never used  Substance Use Topics  . Alcohol use: Yes    Alcohol/week: 0.0 standard drinks    Comment: 10/16/2013 "2-3 beers daily generally; nothing last month or so"  . Drug use: No    Family History  Adopted: Yes  Problem Relation Age of Onset  . Hypertension Father     Allergies  Allergen Reactions  . Avelox [Moxifloxacin Hcl In Nacl] Hives, Shortness Of Breath and Swelling  . Medical Adhesive Remover Dermatitis and Rash  . Tape Dermatitis and Rash    Medication list has been reviewed and updated.  Current Outpatient Medications on File Prior to Visit  Medication Sig Dispense Refill  .  atorvastatin (LIPITOR) 10 MG tablet TAKE 1 TABLET BY MOUTH EVERYDAY AT BEDTIME 90 tablet 3  . B Complex Vitamins (B COMPLEX 100 PO) Take 100 mg by mouth daily.    . Bevacizumab (AVASTIN) 100 MG/4ML SOLN Inject into the vein.    . cetirizine (ZYRTEC) 10 MG tablet Take 5 mg by mouth daily.     Marland Kitchen denosumab (XGEVA) 120 MG/1.7ML SOLN injection Inject 120 mg into the skin every 30 (thirty) days.     Marland Kitchen doxycycline (ADOXA) 50 MG tablet Take 50 mg by mouth daily.    . DULoxetine (CYMBALTA)  20 MG capsule TAKE 1 CAPSULE BY MOUTH EVERY DAY 90 capsule 3  . erlotinib (TARCEVA) 150 MG tablet Take 150 mg by mouth daily. Take on an empty stomach 1 hour before meals or 2 hours after    . HYDROcodone-acetaminophen (NORCO) 7.5-325 MG tablet Take 1-2 tablets every 4-6 hours as needed for pain. 240 tablet 0  . lidocaine (LIDODERM) 5 % Place 1 patch onto the skin daily. Remove & Discard patch within 12 hours or as directed by MD.  May wear up to 3 patches at a time 60 patch 6  . loperamide (IMODIUM A-D) 2 MG tablet Take 2 mg by mouth 4 (four) times daily as needed for diarrhea or loose stools.    Marland Kitchen LORazepam (ATIVAN) 1 MG tablet Take 0.5-1 tablets (0.5-1 mg total) by mouth at bedtime as needed for sleep. TAKE ONE HALF OR ONE TABLET BY MOUTH AT BEDTIME IF NEEDED FOR SLEEP, DO NOT TAKE WITH PAIN MEDS 30 tablet 5  . losartan (COZAAR) 100 MG tablet Take by mouth.    . Multiple Vitamins-Minerals (CENTRUM SILVER 50+MEN PO) Take 1 tablet by mouth daily.    Marland Kitchen NIFEdipine (ADALAT CC) 60 MG 24 hr tablet Take 120 mg by mouth daily.    . ondansetron (ZOFRAN-ODT) 4 MG disintegrating tablet TAKE 1 TABLET BY MOUTH SUBLINGUALLY EVERY 8 HOURS AS NEEDED FOR NAUSEA OR VOMITING 40 tablet 2  . pantoprazole (PROTONIX) 20 MG tablet Take 20 mg by mouth daily.    . traZODone (DESYREL) 50 MG tablet TAKE 0.5-1 TABLETS BY MOUTH AT BEDTIME AS NEEDED FOR SLEEP. 90 tablet 2  . UNABLE TO FIND Take 1 tablet by mouth daily. Med Name: Calcium 600/D-3 300 2  daily     . vitamin C (ASCORBIC ACID) 500 MG tablet Take 500 mg by mouth daily.     No current facility-administered medications on file prior to visit.    Review of Systems:  As per HPI- otherwise negative.   Physical Examination: Vitals:   05/24/20 1059  BP: 106/80  Pulse: (!) 102  Resp: 16  SpO2: 98%   Vitals:   05/24/20 1059  Weight: 160 lb (72.6 kg)  Height: 6\' 1"  (1.854 m)   Body mass index is 21.11 kg/m. Ideal Body Weight: Weight in (lb) to have BMI = 25: 189.1  GEN: no acute distress.  Thin, appears to have mild chronic illness HEENT: Atraumatic, Normocephalic.  Ears and Nose: No external deformity. CV: RRR, No M/G/R. No JVD. No thrill. No extra heart sounds. PULM: CTA B, no wheezes, crackles, rhonchi. No retractions. No resp. distress. No accessory muscle use. ABD: S,ND, +BS. No rebound. No HSM.  Right upper quad incisional hernia is non- tender today He has some mild epigastric tenderness  EXTR: No c/c/e PSYCH: Normally interactive. Conversant.  Negative for blood in stool on hemoccult testing  EKG: concern for acute ST elevation   Assessment and Plan: Epigastric pain - Plan: DG Chest 2 View, EKG 12-Lead, IFOBT POC (occult bld, rslt in office)  ST elevation  Pt here today with concern of ?abdomianl pain that has radiated into his shoulders and back for about 6 weeks.  EKG today is concerning for possible ST elevation MI Pt taken to on site ER for immediate care in [facility]. I called and spoke to EDP about pt situation     Signed Lamar Blinks, MD

## 2020-05-24 NOTE — Interval H&P Note (Signed)
History and Physical Interval Note:  05/24/2020 4:42 PM  Douglas [name]  has presented today for surgery, with the diagnosis of urgent.  The various methods of treatment have been discussed with the patient and family. After consideration of risks, benefits and other options for treatment, the patient has consented to  Procedure(s): LEFT HEART CATH AND CORONARY ANGIOGRAPHY (N/A) as a surgical intervention.  The patient's history has been reviewed, patient examined, no change in status, stable for surgery.  I have reviewed the patient's chart and labs.  Questions were answered to the patient's satisfaction.    Cath Lab Visit (complete for each Cath Lab visit)  Clinical Evaluation Leading to the Procedure:   ACS: Yes.    Non-ACS:    Anginal Classification: CCS IV  Anti-ischemic medical therapy: No Therapy  Non-Invasive Test Results: No non-invasive testing performed  Prior CABG: No previous CABG       Collier Salina [facility] 05/24/2020 4:43 PM

## 2020-05-24 NOTE — Telephone Encounter (Signed)
Tiffany from Patient Engagement called to make sure [place] and Johnsie Cancel spoke about patients visit & ED visit

## 2020-05-24 NOTE — ED Notes (Signed)
Report given to Science writer at [facility]

## 2020-05-25 ENCOUNTER — Encounter (HOSPITAL_COMMUNITY): Payer: Self-pay | Admitting: Cardiology

## 2020-05-25 ENCOUNTER — Inpatient Hospital Stay (HOSPITAL_COMMUNITY): Payer: Medicare HMO

## 2020-05-25 DIAGNOSIS — E782 Mixed hyperlipidemia: Secondary | ICD-10-CM

## 2020-05-25 DIAGNOSIS — R079 Chest pain, unspecified: Secondary | ICD-10-CM

## 2020-05-25 LAB — CBC
HCT: 37.6 % — ABNORMAL LOW (ref 39.0–52.0)
Hemoglobin: 12.8 g/dL — ABNORMAL LOW (ref 13.0–17.0)
MCH: 32.2 pg (ref 26.0–34.0)
MCHC: 34 g/dL (ref 30.0–36.0)
MCV: 94.7 fL (ref 80.0–100.0)
Platelets: 239 10*3/uL (ref 150–400)
RBC: 3.97 MIL/uL — ABNORMAL LOW (ref 4.22–5.81)
RDW: 13.6 % (ref 11.5–15.5)
WBC: 10.3 10*3/uL (ref 4.0–10.5)
nRBC: 0 % (ref 0.0–0.2)

## 2020-05-25 LAB — ECHOCARDIOGRAM COMPLETE
AR max vel: 3.02 cm2
AV Area VTI: 2.55 cm2
AV Area mean vel: 2.69 cm2
AV Mean grad: 3 mmHg
AV Peak grad: 6 mmHg
Ao pk vel: 1.22 m/s
Area-P 1/2: 2.54 cm2
Height: 73 in
S' Lateral: 2.9 cm
Weight: 2559.81 oz

## 2020-05-25 LAB — BASIC METABOLIC PANEL
Anion gap: 10 (ref 5–15)
BUN: 36 mg/dL — ABNORMAL HIGH (ref 8–23)
CO2: 22 mmol/L (ref 22–32)
Calcium: 8.9 mg/dL (ref 8.9–10.3)
Chloride: 109 mmol/L (ref 98–111)
Creatinine, Ser: 1.17 mg/dL (ref 0.61–1.24)
GFR, Estimated: >60 mL/min (ref >60)
Glucose, Bld: 93 mg/dL (ref 70–99)
Potassium: 3.8 mmol/L (ref 3.5–5.1)
Sodium: 141 mmol/L (ref 135–145)

## 2020-05-25 LAB — LIPID PANEL
Cholesterol: 121 mg/dL (ref 0–200)
HDL: 48 mg/dL (ref >40)
LDL Cholesterol: 35 mg/dL (ref 0–99)
Total CHOL/HDL Ratio: 2.5 RATIO
Triglycerides: 189 mg/dL — ABNORMAL HIGH (ref <150)
VLDL: 38 mg/dL (ref 0–40)

## 2020-05-25 LAB — HIV ANTIBODY (ROUTINE TESTING W REFLEX): HIV Screen 4th Generation wRfx: NONREACTIVE

## 2020-05-25 MED ORDER — CLOPIDOGREL BISULFATE 75 MG PO TABS
300.0000 mg | ORAL_TABLET | Freq: Once | ORAL | Status: AC
  Administered 2020-05-25: 300 mg via ORAL
  Filled 2020-05-25: qty 4

## 2020-05-25 MED ORDER — CLOPIDOGREL BISULFATE 75 MG PO TABS
75.0000 mg | ORAL_TABLET | Freq: Every day | ORAL | Status: DC
  Administered 2020-05-26: 75 mg via ORAL
  Filled 2020-05-25: qty 1

## 2020-05-25 NOTE — Progress Notes (Signed)
Called to apply TR band by Dr.Nahser.  Oozing noted, bruising present on right wrist distal to stick site. Does not appear to be arterial bleed. Tr Band applied at 6cc.  Tr band to remain on for 2 hrs from 09:45:00 per Dr. Acie Fredrickson.  Spo2 as measured in right index finger at 77% after band application.

## 2020-05-25 NOTE — Progress Notes (Signed)
CARDIAC REHAB PHASE I   PRE:  Rate/Rhythm: 81 SR    BP: sitting 110/86    SaO2: 100 RA  MODE:  Ambulation: 870 ft   POST:  Rate/Rhythm: 100 ST    BP: sitting 124/91     SaO2: 98 RA  Tolerated well, no c/o. Eager to ambulate. VSS. Discussed MI, stent, Plavix, restrictions, exercise, NTG and CRPII. Pt and wife very receptive. He has lost 40 lbs due to his ca and will not be able to stay on his trial if he loses 2-3 lbs more. Therefore did not give HH diet. Will refer to [place], ACSM 05/25/2020 3:08 PM

## 2020-05-25 NOTE — Progress Notes (Signed)
Came to ambulate/educate however pts radial site bleeding. Will f/u later for ambulation and education. Assisted in calming pt. [phone] Yves Dill CES, ACSM 9:45 AM 05/25/2020

## 2020-05-25 NOTE — Progress Notes (Signed)
Initial Nutrition Assessment  DOCUMENTATION CODES:   Not applicable  INTERVENTION:  Magic cup TID with meals, each supplement provides 290 kcal and 9 grams of protein  Continue Ensure Enlive po BID, each supplement provides 350 kcal and 20 grams of protein  Continue MVI daily  NUTRITION DIAGNOSIS:   Increased nutrient needs related to cancer and cancer related treatments as evidenced by estimated needs.    GOAL:   Patient will meet greater than or equal to 90% of their needs    MONITOR:   PO intake, Supplement acceptance, Labs, Weight trends, I & O's  REASON FOR ASSESSMENT:   Malnutrition Screening Tool    ASSESSMENT:   Pt brought to [facility] with code STEMI and underwent urgent L heart cath and coronary angiography. PMH includes hereditary leiomyomatosis and stage IV renal cancer with metastasis to bone (he is in [facility] clinical trials for 55 months and sees oncology at [facility]), HTN, HLD, PVD, and chronic L shoulder pain.   Pt with elevated LFTs -- suspected to be due to completed MI per Cardiology.   Pt unavailable at time of RD visit.  No PO intake documented. Pt does have orders for Ensure Enlive BID. Per RN, pt refused the one offered this morning (first one pt has been offered since admit). RN to encourage consumption of afternoon Ensure.   Reviewed wt history. No significant wt changes noted.   UOP: 741ml x24 hours  Labs reviewed. Medications: vitamin c, mvi  NUTRITION - FOCUSED PHYSICAL EXAM:  Unable to perform at this time; will attempt at follow-up.   Diet Order:   Diet Order            Diet Heart Room service appropriate? Yes; Fluid consistency: Thin  Diet effective now                 EDUCATION NEEDS:   No education needs have been identified at this time  Skin:  Skin Assessment: Reviewed RN Assessment  Last BM:  PTA  Height:   Ht Readings from Last 1 Encounters:  05/24/20 6\' 1"  (1.854 m)    Weight:   Wt Readings from Last 10  Encounters:  05/24/20 72.6 kg  05/24/20 72.6 kg  04/14/20 72.6 kg  04/09/20 74.9 kg  02/16/20 73.5 kg  09/08/19 74.8 kg  03/19/19 72.6 kg  07/29/18 73 kg  07/11/18 76.2 kg  04/15/18 76.2 kg    BMI:  Body mass index is 21.11 kg/m.  Estimated Nutritional Needs:   Kcal:  1497-0263  Protein:  115-130 grams  Fluid:  >2L/d    Larkin Ina, MS, RD, LDN RD pager number and weekend/on-call pager number located in [place].

## 2020-05-25 NOTE — Progress Notes (Addendum)
Progress Note  Patient Name: Douglas [name] Date of Encounter: 05/25/2020  [facility] Cardiologist: Jenkins Rouge, MD   Subjective   64 year old gentleman with a history of hypertension, hyperlipidemia.  He was admitted with left shoulder pain and was found to have an ST segment elevation myocardial infarction.  Heart catheterization yesterday revealed a proximal 90% LAD stenosis.  His ramus intermediate branch had a 50% stenosis in the right coronary artery had a mid 35% stenosis.  He had successful stenting of his proximal LAD.  His LVEDP was normal. The plan is 1 year for dual antiplatelet therapy.  He has significant bleeding from his right radial cath site Ive requested a cath lab nurse / tech to bring a TR band back up  He denies any chest pain  Echo to be done later today     Inpatient Medications    Scheduled Meds: . vitamin C  500 mg Oral Daily  . aspirin EC  81 mg Oral Daily  . DULoxetine  20 mg Oral Daily  . enoxaparin (LOVENOX) injection  40 mg Subcutaneous Q24H  . erlotinib  150 mg Oral Daily  . feeding supplement  237 mL Oral BID BM  . loratadine  10 mg Oral Daily  . losartan  100 mg Oral Daily  . multivitamin with minerals  1 tablet Oral Daily  . pantoprazole  20 mg Oral Daily  . sodium chloride flush  3 mL Intravenous Q12H  . ticagrelor  90 mg Oral BID   Continuous Infusions: . sodium chloride     PRN Meds: sodium chloride, acetaminophen, HYDROcodone-acetaminophen, LORazepam, nitroGLYCERIN, ondansetron (ZOFRAN) IV, sodium chloride flush   Vital Signs    Vitals:   05/25/20 0100 05/25/20 0130 05/25/20 0500 05/25/20 0750  BP: (!) 148/83 (!) 118/99 (!) 132/95 (!) 126/98  Pulse: 80 87 84 87  Resp: 18 (!) 8 11 14   Temp:   98 F (36.7 C) 98.3 F (36.8 C)  TempSrc:   Oral Oral  SpO2: 99% 98% 100% 100%  Weight:      Height:        Intake/Output Summary (Last 24 hours) at 05/25/2020 0918 Last data filed at 05/25/2020 0600 Gross per 24 hour   Intake 759.09 ml  Output 700 ml  Net 59.09 ml   Last 3 Weights 05/24/2020 05/24/2020 04/14/2020  Weight (lbs) 159 lb 15.8 oz 160 lb 160 lb  Weight (kg) 72.57 kg 72.576 kg 72.576 kg      Telemetry    NSR  - Personally Reviewed  ECG     - Personally Reviewed  Physical Exam   GEN: No acute distress.   Somewhat emotional today  Neck: No JVD Cardiac: RRR, no murmurs, rubs, or gallops.  Respiratory: Clear to auscultation bilaterally. GI: Soft, nontender, non-distended  MS: R radial cath site is bleeding.  Nurse is holding pressure  Neuro:  Nonfocal  Psych: Normal affect   Labs    High Sensitivity Troponin:   Recent Labs  Lab 05/24/20 1220 05/24/20 1505  TROPONINIHS >27,000* >27,000*      Chemistry Recent Labs  Lab 05/24/20 1220 05/24/20 2242 05/25/20 0216  NA 137  --  141  K 3.7  --  3.8  CL 104  --  109  CO2 22  --  22  GLUCOSE 94  --  93  BUN 22  --  36*  CREATININE 1.43* 1.26* 1.17  CALCIUM 9.8  --  8.9  PROT 7.1  --   --  ALBUMIN 4.0  --   --   AST 447*  --   --   ALT 91*  --   --   ALKPHOS 55  --   --   BILITOT 0.7  --   --   GFRNONAA 54* >60 >60  ANIONGAP 11  --  10     Hematology Recent Labs  Lab 05/24/20 1220 05/24/20 2242 05/25/20 0216  WBC 15.1* 11.0* 10.3  RBC 4.97 4.20* 3.97*  HGB 16.1 13.9 12.8*  HCT 46.9 39.6 37.6*  MCV 94.4 94.3 94.7  MCH 32.4 33.1 32.2  MCHC 34.3 35.1 34.0  RDW 13.5 13.7 13.6  PLT 291 254 239    BNPNo results for input(s): BNP, PROBNP in the last 168 hours.   DDimer No results for input(s): DDIMER in the last 168 hours.   Radiology    CARDIAC CATHETERIZATION  Result Date: 05/24/2020  Prox LAD lesion is 90% stenosed.  Ramus lesion is 50% stenosed.  Mid RCA lesion is 35% stenosed.  Post intervention, there is a 0% residual stenosis.  A drug-eluting stent was successfully placed using a STENT RESOLUTE ONYX 3.0X22.  LV end diastolic pressure is normal.  1. Single vessel occlusive CAD involving the  proximal LAD 2. Normal LVEDP 3. Successful PCI of the proximal LAD with DES x 1 Plan: DAPT for one year. Risk factor modification. Will assess LV function with Echo.    Cardiac Studies      Patient Profile     64 y.o. male   Assessment & Plan    1 anterior wall myocardial infarction: His troponin level is greater than 27,000.  He status post stenting.  Dr. Gwenlyn Found was able to achieve a very nice angiographic result following stenting of his LAD.  He is scheduled for an echocardiogram to assess his LV function.  He is having significant bruising/bleeding from his right radial cath site.  On having a TR band reapplied.  He would like to be discharged today but I would like for him to stay with [place] today and be discharged tomorrow.    2.  Hyperlipidemia: His lipids from this morning reveal a total cholesterol of 121.  His LDL is 35.  Triglycerides are 189. Continue current dose of atorvastatin.  For questions or updates, please contact [place] Please consult [email or web address] for contact info under        Signed, Mertie Moores, MD  05/25/2020, 9:18 AM

## 2020-05-25 NOTE — Progress Notes (Signed)
TR Band removed and cleansed area, and gauze w/ clear dressing.Bruising noted to wrist but remains soft. Pulse 2+. Cap refill < 2 seconds. Full sensation.

## 2020-05-25 NOTE — Progress Notes (Signed)
  Echocardiogram 2D Echocardiogram has been performed.  Douglas [name] 05/25/2020, 2:10 PM

## 2020-05-26 ENCOUNTER — Telehealth: Payer: Self-pay | Admitting: Cardiovascular Disease

## 2020-05-26 DIAGNOSIS — I10 Essential (primary) hypertension: Secondary | ICD-10-CM

## 2020-05-26 DIAGNOSIS — I5043 Acute on chronic combined systolic (congestive) and diastolic (congestive) heart failure: Secondary | ICD-10-CM

## 2020-05-26 LAB — CBC
HCT: 32.6 % — ABNORMAL LOW (ref 39.0–52.0)
Hemoglobin: 11.5 g/dL — ABNORMAL LOW (ref 13.0–17.0)
MCH: 33 pg (ref 26.0–34.0)
MCHC: 35.3 g/dL (ref 30.0–36.0)
MCV: 93.4 fL (ref 80.0–100.0)
Platelets: 222 10*3/uL (ref 150–400)
RBC: 3.49 MIL/uL — ABNORMAL LOW (ref 4.22–5.81)
RDW: 13.9 % (ref 11.5–15.5)
WBC: 9 10*3/uL (ref 4.0–10.5)
nRBC: 0 % (ref 0.0–0.2)

## 2020-05-26 MED ORDER — ASPIRIN 81 MG PO TBEC: 81.0000 mg | DELAYED_RELEASE_TABLET | Freq: Every day | ORAL | 11 refills | Status: DC

## 2020-05-26 MED ORDER — CARVEDILOL 3.125 MG PO TABS: 3.1250 mg | ORAL_TABLET | Freq: Two times a day (BID) | ORAL | 6 refills | Status: DC

## 2020-05-26 MED ORDER — CARVEDILOL 3.125 MG PO TABS
3.1250 mg | ORAL_TABLET | Freq: Two times a day (BID) | ORAL | Status: DC
  Administered 2020-05-26: 3.125 mg via ORAL
  Filled 2020-05-26: qty 1

## 2020-05-26 MED ORDER — NITROGLYCERIN 0.4 MG SL SUBL: 0.4000 mg | SUBLINGUAL_TABLET | SUBLINGUAL | 6 refills | Status: DC | PRN

## 2020-05-26 MED ORDER — CLOPIDOGREL BISULFATE 75 MG PO TABS: 75.0000 mg | ORAL_TABLET | Freq: Every day | ORAL | 11 refills | Status: DC

## 2020-05-26 MED ORDER — SACUBITRIL-VALSARTAN 24-26 MG PO TABS: 1.0000 | ORAL_TABLET | Freq: Two times a day (BID) | ORAL | Status: DC

## 2020-05-26 MED ORDER — ROSUVASTATIN CALCIUM 10 MG PO TABS: 10.0000 mg | ORAL_TABLET | Freq: Every day | ORAL | 6 refills | Status: DC

## 2020-05-26 MED ORDER — ROSUVASTATIN CALCIUM 5 MG PO TABS
10.0000 mg | ORAL_TABLET | Freq: Every day | ORAL | Status: DC
  Administered 2020-05-26: 10 mg via ORAL
  Filled 2020-05-26: qty 2

## 2020-05-26 MED ORDER — SACUBITRIL-VALSARTAN 24-26 MG PO TABS: 1.0000 | ORAL_TABLET | Freq: Two times a day (BID) | ORAL | 6 refills | Status: DC

## 2020-05-26 NOTE — Discharge Instructions (Signed)
Call [facility] at [phone] if any bleeding, swelling or drainage at cath site.  May shower, no tub baths for 48 hours for groin sticks. No lifting over 5 pounds for 5 days.  No Driving for 5 days if you drive  Take 1 NTG, under your tongue, while sitting.  If no relief of pain may repeat NTG, one tab every 5 minutes up to 3 tablets total over 15 minutes.  If no relief CALL 911.  If you have dizziness/lightheadness  while taking NTG, stop taking and call 911.        Heart Healthy diet   Do NOT stop asprin and plavix to keep stents open.  If you have questions on your Chemo please call [facility] or [facility].     Follow up appointment at different site for this time only.

## 2020-05-26 NOTE — Progress Notes (Addendum)
Progress Note  Patient Name: Douglas [name] Date of Encounter: 05/26/2020  [facility] Cardiologist: Jenkins Rouge, MD   Subjective   No chest pain, no SOB, sitting on side of bed waiting for BK   Inpatient Medications    Scheduled Meds: . vitamin C  500 mg Oral Daily  . aspirin EC  81 mg Oral Daily  . clopidogrel  75 mg Oral Daily  . DULoxetine  20 mg Oral Daily  . enoxaparin (LOVENOX) injection  40 mg Subcutaneous Q24H  . erlotinib  150 mg Oral Daily  . feeding supplement  237 mL Oral BID BM  . loratadine  10 mg Oral Daily  . losartan  100 mg Oral Daily  . multivitamin with minerals  1 tablet Oral Daily  . pantoprazole  20 mg Oral Daily  . sodium chloride flush  3 mL Intravenous Q12H   Continuous Infusions: . sodium chloride     PRN Meds: sodium chloride, acetaminophen, HYDROcodone-acetaminophen, LORazepam, nitroGLYCERIN, ondansetron (ZOFRAN) IV, sodium chloride flush   Vital Signs    Vitals:   05/25/20 1217 05/25/20 2000 05/25/20 2055 05/26/20 0016  BP: 106/88  114/86 128/87  Pulse: 93 82 70 81  Resp: 16 19 15 17   Temp: 98.2 F (36.8 C)  98.1 F (36.7 C) 99.8 F (37.7 C)  TempSrc: Oral  Oral Oral  SpO2: 97% 99% 97% 100%  Weight:      Height:        Intake/Output Summary (Last 24 hours) at 05/26/2020 0734 Last data filed at 05/25/2020 1316 Gross per 24 hour  Intake 240 ml  Output --  Net 240 ml   Last 3 Weights 05/24/2020 05/24/2020 04/14/2020  Weight (lbs) 159 lb 15.8 oz 160 lb 160 lb  Weight (kg) 72.57 kg 72.576 kg 72.576 kg      Telemetry    SR - Personally Reviewed  ECG    No new - Personally Reviewed  Physical Exam   GEN: No acute distress.   Neck: No JVD Cardiac: RRR, no murmurs, rubs, or gallops. Rt Wrist with ecchymosis but no increase  Respiratory: Clear, mildly diminished to auscultation bilaterally. GI: Soft, nontender, non-distended  MS: No edema; No deformity. Neuro:  Nonfocal  Psych: Normal affect   Labs    High  Sensitivity Troponin:   Recent Labs  Lab 05/24/20 1220 05/24/20 1505  TROPONINIHS >27,000* >27,000*      Chemistry Recent Labs  Lab 05/24/20 1220 05/24/20 2242 05/25/20 0216  NA 137  --  141  K 3.7  --  3.8  CL 104  --  109  CO2 22  --  22  GLUCOSE 94  --  93  BUN 22  --  36*  CREATININE 1.43* 1.26* 1.17  CALCIUM 9.8  --  8.9  PROT 7.1  --   --   ALBUMIN 4.0  --   --   AST 447*  --   --   ALT 91*  --   --   ALKPHOS 55  --   --   BILITOT 0.7  --   --   GFRNONAA 54* >60 >60  ANIONGAP 11  --  10     Hematology Recent Labs  Lab 05/24/20 2242 05/25/20 0216 05/26/20 0429  WBC 11.0* 10.3 9.0  RBC 4.20* 3.97* 3.49*  HGB 13.9 12.8* 11.5*  HCT 39.6 37.6* 32.6*  MCV 94.3 94.7 93.4  MCH 33.1 32.2 33.0  MCHC 35.1 34.0 35.3  RDW 13.7  13.6 13.9  PLT 254 239 222    BNPNo results for input(s): BNP, PROBNP in the last 168 hours.   DDimer No results for input(s): DDIMER in the last 168 hours.   Radiology    CARDIAC CATHETERIZATION  Result Date: 05/24/2020  Prox LAD lesion is 90% stenosed.  Ramus lesion is 50% stenosed.  Mid RCA lesion is 35% stenosed.  Post intervention, there is a 0% residual stenosis.  A drug-eluting stent was successfully placed using a STENT RESOLUTE ONYX 3.0X22.  LV end diastolic pressure is normal.  1. Single vessel occlusive CAD involving the proximal LAD 2. Normal LVEDP 3. Successful PCI of the proximal LAD with DES x 1 Plan: DAPT for one year. Risk factor modification. Will assess LV function with Echo.   ECHOCARDIOGRAM COMPLETE  Result Date: 05/25/2020    ECHOCARDIOGRAM REPORT   Patient Name:   Douglas [name] Date of Exam: 05/25/2020 Medical Rec #:  [id number]       Height:       73.0 in Accession #:    [id number]      Weight:       160.0 lb Date of Birth:  01/12/1956      BSA:          1.957 m Patient Age:    64 years        BP:           132/95 mmHg Patient Gender: M               HR:           84 bpm. Exam Location:  Inpatient Procedure:  2D Echo, Cardiac Doppler and Color Doppler Indications:    Chest pain  History:        Patient has prior history of Echocardiogram examinations, most                 recent 07/21/2015. COPD; Risk Factors:Hypertension and                 Dyslipidemia.  Sonographer:    Clayton Lefort RDCS (AE) Referring Phys: [id number] [place]  1. Left ventricular ejection fraction, by estimation, is 35 to 40%. The left ventricle has moderately decreased function. The left ventricle has no regional wall motion abnormalities. Left ventricular diastolic parameters are consistent with Grade I diastolic dysfunction (impaired relaxation). There is mild apical dyskinesis. There is severe hypokinesis of the entire anterior septum, but the basal and mid segments of the anterior wall have preserved contractility.  2. Right ventricular systolic function is normal. The right ventricular size is normal.  3. The mitral valve is normal in structure. No evidence of mitral valve regurgitation. No evidence of mitral stenosis.  4. The aortic valve is normal in structure. Aortic valve regurgitation is not visualized. No aortic stenosis is present.  5. The inferior vena cava is normal in size with greater than 50% respiratory variability, suggesting right atrial pressure of 3 mmHg. FINDINGS  Left Ventricle: Left ventricular ejection fraction, by estimation, is 35 to 40%. The left ventricle has moderately decreased function. The left ventricle has no regional wall motion abnormalities. The left ventricular internal cavity size was normal in size. There is no left ventricular hypertrophy. Left ventricular diastolic parameters are consistent with Grade I diastolic dysfunction (impaired relaxation). Normal left ventricular filling pressure.  LV Wall Scoring: There is mild apical dyskinesis. There is severe hypokinesis of the entire anterior septum, but the basal and mid segments  of the anterior wall have preserved contractility. Right  Ventricle: The right ventricular size is normal. No increase in right ventricular wall thickness. Right ventricular systolic function is normal. Left Atrium: Left atrial size was normal in size. Right Atrium: Right atrial size was normal in size. Pericardium: There is no evidence of pericardial effusion. Mitral Valve: The mitral valve is normal in structure. No evidence of mitral valve regurgitation. No evidence of mitral valve stenosis. MV peak gradient, 2.0 mmHg. The mean mitral valve gradient is 1.0 mmHg. Tricuspid Valve: The tricuspid valve is normal in structure. Tricuspid valve regurgitation is not demonstrated. No evidence of tricuspid stenosis. Aortic Valve: The aortic valve is normal in structure. Aortic valve regurgitation is not visualized. No aortic stenosis is present. Aortic valve mean gradient measures 3.0 mmHg. Aortic valve peak gradient measures 6.0 mmHg. Aortic valve area, by VTI measures 2.55 cm. Pulmonic Valve: The pulmonic valve was normal in structure. Pulmonic valve regurgitation is not visualized. No evidence of pulmonic stenosis. Aorta: The aortic root is normal in size and structure. Venous: The inferior vena cava is normal in size with greater than 50% respiratory variability, suggesting right atrial pressure of 3 mmHg. IAS/Shunts: No atrial level shunt detected by color flow Doppler.  LEFT VENTRICLE PLAX 2D LVIDd:         3.90 cm  Diastology LVIDs:         2.90 cm  LV e' medial:    5.66 cm/s LV PW:         1.30 cm  LV E/e' medial:  8.4 LV IVS:        1.50 cm  LV e' lateral:   13.90 cm/s LVOT diam:     2.30 cm  LV E/e' lateral: 3.4 LV SV:         59 LV SV Index:   30 LVOT Area:     4.15 cm  RIGHT VENTRICLE             IVC RV Basal diam:  2.80 cm     IVC diam: 1.00 cm RV S prime:     11.20 cm/s TAPSE (M-mode): 3.0 cm LEFT ATRIUM           Index       RIGHT ATRIUM           Index LA diam:      2.60 cm 1.33 cm/m  RA Area:     12.30 cm LA Vol (A2C): 29.5 ml 15.07 ml/m RA Volume:   32.80  ml  16.76 ml/m LA Vol (A4C): 23.9 ml 12.21 ml/m  AORTIC VALVE AV Area (Vmax):    3.02 cm AV Area (Vmean):   2.69 cm AV Area (VTI):     2.55 cm AV Vmax:           122.00 cm/s AV Vmean:          85.200 cm/s AV VTI:            0.233 m AV Peak Grad:      6.0 mmHg AV Mean Grad:      3.0 mmHg LVOT Vmax:         88.80 cm/s LVOT Vmean:        55.200 cm/s LVOT VTI:          0.143 m LVOT/AV VTI ratio: 0.61  AORTA Ao Root diam: 3.50 cm MITRAL VALVE MV Area (PHT): 2.54 cm    SHUNTS MV Peak grad:  2.0 mmHg  Systemic VTI:  0.14 m MV Mean grad:  1.0 mmHg    Systemic Diam: 2.30 cm MV Vmax:       0.71 m/s MV Vmean:      45.7 cm/s MV Decel Time: 299 msec MV E velocity: 47.60 cm/s MV A velocity: 53.30 cm/s MV E/A ratio:  0.89 Mihai Croitoru MD Electronically signed by Sanda Klein MD Signature Date/Time: 05/25/2020/2:22:37 PM    Final     Cardiac Studies   05/24/20 cardiac cath and PCI  Prox LAD lesion is 90% stenosed.  Ramus lesion is 50% stenosed.  Mid RCA lesion is 35% stenosed.  Post intervention, there is a 0% residual stenosis.  A drug-eluting stent was successfully placed using a STENT RESOLUTE ONYX 3.0X22.  LV end diastolic pressure is normal.   1. Single vessel occlusive CAD involving the proximal LAD 2. Normal LVEDP 3. Successful PCI of the proximal LAD with DES x 1  Plan: DAPT for one year. Risk factor modification. Will assess LV function with Echo.   Echo 05/25/20  IMPRESSIONS    1. Left ventricular ejection fraction, by estimation, is 35 to 40%. The  left ventricle has moderately decreased function. The left ventricle has  no regional wall motion abnormalities. Left ventricular diastolic  parameters are consistent with Grade I  diastolic dysfunction (impaired relaxation). There is mild apical  dyskinesis. There is severe hypokinesis of the entire anterior septum, but  the basal and mid segments of the anterior wall have preserved  contractility.  2. Right ventricular  systolic function is normal. The right ventricular  size is normal.  3. The mitral valve is normal in structure. No evidence of mitral valve  regurgitation. No evidence of mitral stenosis.  4. The aortic valve is normal in structure. Aortic valve regurgitation is  not visualized. No aortic stenosis is present.  5. The inferior vena cava is normal in size with greater than 50%  respiratory variability, suggesting right atrial pressure of 3 mmHg.   FINDINGS  Left Ventricle: Left ventricular ejection fraction, by estimation, is 35  to 40%. The left ventricle has moderately decreased function. The left  ventricle has no regional wall motion abnormalities. The left ventricular  internal cavity size was normal in  size. There is no left ventricular hypertrophy. Left ventricular diastolic  parameters are consistent with Grade I diastolic dysfunction (impaired  relaxation). Normal left ventricular filling pressure.     LV Wall Scoring:  There is mild apical dyskinesis. There is severe hypokinesis of the entire  anterior septum, but the basal and mid segments of the anterior wall have  preserved contractility.   Right Ventricle: The right ventricular size is normal. No increase in  right ventricular wall thickness. Right ventricular systolic function is  normal.   Left Atrium: Left atrial size was normal in size.   Right Atrium: Right atrial size was normal in size.   Pericardium: There is no evidence of pericardial effusion.   Mitral Valve: The mitral valve is normal in structure. No evidence of  mitral valve regurgitation. No evidence of mitral valve stenosis. MV peak  gradient, 2.0 mmHg. The mean mitral valve gradient is 1.0 mmHg.   Tricuspid Valve: The tricuspid valve is normal in structure. Tricuspid  valve regurgitation is not demonstrated. No evidence of tricuspid  stenosis.   Aortic Valve: The aortic valve is normal in structure. Aortic valve  regurgitation is not  visualized. No aortic stenosis is present. Aortic  valve mean gradient measures 3.0  mmHg. Aortic valve peak gradient measures  6.0 mmHg. Aortic valve area, by VTI  measures 2.55 cm.   Pulmonic Valve: The pulmonic valve was normal in structure. Pulmonic valve  regurgitation is not visualized. No evidence of pulmonic stenosis.   Aorta: The aortic root is normal in size and structure.   Venous: The inferior vena cava is normal in size with greater than 50%  respiratory variability, suggesting right atrial pressure of 3 mmHg.   IAS/Shunts: No atrial level shunt detected by color flow Doppler.  Patient Profile     64 y.o. male hx HTN, HLD, STEMI this admit, cath with 90% LAD with PCI to pLAD.   Assessment & Plan    1.  anterior wall myocardial infarction: His troponin level is greater than 27,000.  He status post stenting to LAD.  Dr. Gwenlyn Found was able to achieve a very nice angiographic result following stenting of his LAD. EF 35%  On ASA plavix   He is having significant bruising/bleeding from his right radial cath site.  On having a TR band reapplied.-today stable   He wanted to be discharged yesterday but agreed to stay until today and be discharged today    2.  Hyperlipidemia: His lipids from this morning reveal a total cholesterol of 121.  His LDL is 35.  Triglycerides are 189.  will change atorvastatin to rosuvastatin 10 mg a day   3.  CM with EF 35-40%  Will DC losartan, Start Entresto 49-51 mg PO BID Add coreg 3.125 BID  For questions or updates, please contact [facility] Please consult [email or web address] for contact info under        Signed, Cecilie Kicks, NP  05/26/2020, 7:34 AM    Attending Note:   The patient was seen and examined.  Agree with assessment and plan as noted above.  Changes made to the above note as needed.  Patient seen and independently examined with  Cecilie Kicks, NA .   We discussed all aspects of the encounter. I agree with the assessment and plan  as stated above.  1.  Anterior wall myocardial infarction: Patient had moderate to severe anterior wall MI.  He had a 90% LAD stenosis.  He had successful stenting.  Unfortunately he is developed an ischemic cardiomyopathy with an EF of 35 to 40%.  He was initially on aspirin and Brilinta but developed severe shortness of breath with the Brilinta. We have reloaded with Plavix and start him on Plavix 75 mg a day. He will continue with rosuvastatin 10 mg a day instead of atorvastatin.  2.  Hyperlipidemia: His LDL is 121.  We will discontinue the atorvastatin and start him on rosuvastatin 10 mg a day.  3.  Acute combined systolic and diastolic congestive heart failure: His ejection fraction is 35 to 40%.  We discontinued the losartan.  We started him on Entresto 49-51 mg p.o. twice daily.  We have also started him on carvedilol 3.125 mg twice a day.  He was previously on nifedipine.  I would like to avoid nifedipine if possible since he has reduced LV function.  Anticipate having another echocardiogram performed in the next 3 months or so.  4.  Metastatic renal cell carcinoma: Spoken to Franne Grip  who is the nurse coordinator at the [facility].( work - [phone]. Cell [phone])  I've reviewed his med list .  She is an agreement with his meds.   He is ambulated in the halls without any issues.  I told him  to take it easy and to not lift anything heavier than a gallon of milk with his right wrist.  He will be discharged home.  He will follow-up with [name]  or an APP in the next several weeks.   I have spent a total of 40 minutes with patient reviewing hospital  notes , telemetry, EKGs, labs and examining patient as well as establishing an assessment and plan that was discussed with the patient. > 50% of time was spent in direct patient care.    Thayer Headings, Brooke Bonito., MD, [facility] 05/26/2020, 10:37 AM [address]  [place] Pager [phone]

## 2020-05-26 NOTE — Discharge Summary (Addendum)
Discharge Summary    Patient ID: Douglas [name] MRN: [id number]; DOB: 1956-05-03  Admit date: 05/24/2020 Discharge date: 05/26/2020  Primary Care Provider: Darreld Mclean, MD  Primary Cardiologist: Jenkins Rouge, MD  Primary Electrophysiologist:  None   Discharge Diagnoses    Principal Problem:   Acute ST elevation myocardial infarction (STEMI) [facility]) Active Problems:   Hyperlipidemia   Hypertension   CAD (coronary artery disease)    Diagnostic Studies/Procedures    Cardiac cath 05/24/20  Prox LAD lesion is 90% stenosed. Ramus lesion is 50% stenosed. Mid RCA lesion is 35% stenosed. Post intervention, there is a 0% residual stenosis. A drug-eluting stent was successfully placed using a STENT RESOLUTE ONYX 3.0X22. LV end diastolic pressure is normal.   1. Single vessel occlusive CAD involving the proximal LAD 2. Normal LVEDP 3. Successful PCI of the proximal LAD with DES x 1   Plan: DAPT for one year. Risk factor modification. Will assess LV function with Echo.    Echo 05/25/20 IMPRESSIONS     1. Left ventricular ejection fraction, by estimation, is 35 to 40%. The  left ventricle has moderately decreased function. The left ventricle has  no regional wall motion abnormalities. Left ventricular diastolic  parameters are consistent with Grade I  diastolic dysfunction (impaired relaxation). There is mild apical  dyskinesis. There is severe hypokinesis of the entire anterior septum, but  the basal and mid segments of the anterior wall have preserved  contractility.   2. Right ventricular systolic function is normal. The right ventricular  size is normal.   3. The mitral valve is normal in structure. No evidence of mitral valve  regurgitation. No evidence of mitral stenosis.   4. The aortic valve is normal in structure. Aortic valve regurgitation is  not visualized. No aortic stenosis is present.   5. The inferior vena cava is normal in size with greater than 50%   respiratory variability, suggesting right atrial pressure of 3 mmHg.   FINDINGS   Left Ventricle: Left ventricular ejection fraction, by estimation, is 35  to 40%. The left ventricle has moderately decreased function. The left  ventricle has no regional wall motion abnormalities. The left ventricular  internal cavity size was normal in  size. There is no left ventricular hypertrophy. Left ventricular diastolic  parameters are consistent with Grade I diastolic dysfunction (impaired  relaxation). Normal left ventricular filling pressure.      LV Wall Scoring:  There is mild apical dyskinesis. There is severe hypokinesis of the entire  anterior septum, but the basal and mid segments of the anterior wall have  preserved contractility.   Right Ventricle: The right ventricular size is normal. No increase in  right ventricular wall thickness. Right ventricular systolic function is  normal.   Left Atrium: Left atrial size was normal in size.   Right Atrium: Right atrial size was normal in size.   Pericardium: There is no evidence of pericardial effusion.   Mitral Valve: The mitral valve is normal in structure. No evidence of  mitral valve regurgitation. No evidence of mitral valve stenosis. MV peak  gradient, 2.0 mmHg. The mean mitral valve gradient is 1.0 mmHg.   Tricuspid Valve: The tricuspid valve is normal in structure. Tricuspid  valve regurgitation is not demonstrated. No evidence of tricuspid  stenosis.   Aortic Valve: The aortic valve is normal in structure. Aortic valve  regurgitation is not visualized. No aortic stenosis is present. Aortic  valve mean gradient measures 3.0 mmHg. Aortic valve  peak gradient measures  6.0 mmHg. Aortic valve area, by VTI  measures 2.55 cm.   Pulmonic Valve: The pulmonic valve was normal in structure. Pulmonic valve  regurgitation is not visualized. No evidence of pulmonic stenosis.   Aorta: The aortic root is normal in size and structure.    Venous: The inferior vena cava is normal in size with greater than 50%  respiratory variability, suggesting right atrial pressure of 3 mmHg.   IAS/Shunts: No atrial level shunt detected by color flow Doppler.       _____________   History of Present Illness     Douglas [name] is a 64 y.o. male with hx of hereditary leiomyomatosis and renal cell cancer metastatic to bone (he is in [facility] clinical trials for 55 months and sees oncology at [facility] and [facility]), HTN, HLD, PVD, and chronic L shoulder pain presented to [facility] with intermittent epigastric discomfort. A dull achy pressure.  It continued to increase and was most severe 05/23/20 with constant pressure around low sternum/epigastic area.  Some radiation to back and between shoulders.  Saw PCP and EKG with ST elevation so transferred to [place].  He rec'd ASA, morphine and heparin.  Could not go directly to cath lab due to cancer meds and concern for bleeding.  Cardiology spoke with [facility] nurse and pt cleared for anticoagulation and DAPT.   Troponin > 27,000.   Hospital Course     Consultants: none   Pt went to cath lab and found to have LAD disease.  Other coronary arteries non obstructed but with disease see above in cath report.  He had elevated LFTs to suspected due to late presentation.  Will follow as outpt.   Pt has solitary kidney and followed at [facility] and [place].    He is on 3 study drug Tarceva, Avastin and Xgeva.  Statin held with elevated LFTs.  HTN stable.  With drop in EF to 35-40% we stopped nifedipine and have placed on entresto, stopped losartan.  Coreg added.    He developed ecchymosis at cath site so kept to monitor.    He has been seen and evaluated by Dr. Acie Fredrickson and found stable for discharge.  Dr. Acie Fredrickson contacted [facility] and reviewed our meds for pt and they agreed.  We will arrange follow up.  Cardiac rehab did see the pt and discussed rehab, meds, diet.     Cr today at D/C 1.17  LDL 35, Hgb 11.5  Did the  patient have an acute coronary syndrome (MI, NSTEMI, STEMI, etc) this admission?:  Yes                               AHA/ACC Clinical Performance & Quality Measures: Aspirin prescribed? - Yes ADP Receptor Inhibitor (Plavix/Clopidogrel, Brilinta/Ticagrelor or Effient/Prasugrel) prescribed (includes medically managed patients)? - Yes Beta Blocker prescribed? - Yes High Intensity Statin (Lipitor 40-80mg  or Crestor 20-40mg ) prescribed? - No - elevated LFTs will recheck - but placed on Crestor EF assessed during THIS hospitalization? - Yes For EF <40%, was ACEI/ARB prescribed? - Yes For EF <40%, Aldosterone Antagonist (Spironolactone or Eplerenone) prescribed? - No - Reason:  BP issues and new meds may add as outpt Cardiac Rehab Phase II ordered (including medically managed patients)? - Yes       _____________  Discharge Vitals Blood pressure 110/79, pulse 82, temperature 98.4 F (36.9 C), temperature source Oral, resp. rate 14, height 6'  1" (1.854 m), weight 72.6 kg, SpO2 98 %.  Filed Weights   05/24/20 1226  Weight: 72.6 kg    Labs & Radiologic Studies    CBC Recent Labs    05/24/20 1220 05/24/20 2242 05/25/20 0216 05/26/20 0429  WBC 15.1*   < > 10.3 9.0  NEUTROABS 11.5*  --   --   --   HGB 16.1   < > 12.8* 11.5*  HCT 46.9   < > 37.6* 32.6*  MCV 94.4   < > 94.7 93.4  PLT 291   < > 239 222   < > = values in this interval not displayed.   Basic Metabolic Panel Recent Labs    05/24/20 1220 05/24/20 1220 05/24/20 2242 05/25/20 0216  NA 137  --   --  141  K 3.7  --   --  3.8  CL 104  --   --  109  CO2 22  --   --  22  GLUCOSE 94  --   --  93  BUN 22  --   --  36*  CREATININE 1.43*   < > 1.26* 1.17  CALCIUM 9.8  --   --  8.9   < > = values in this interval not displayed.   Liver Function Tests Recent Labs    05/24/20 1220  AST 447*  ALT 91*  ALKPHOS 55  BILITOT 0.7  PROT 7.1  ALBUMIN 4.0   No results for input(s): LIPASE, AMYLASE in the last 72 hours. High  Sensitivity Troponin:   Recent Labs  Lab 05/24/20 1220 05/24/20 1505  TROPONINIHS >27,000* >27,000*    BNP Invalid input(s): POCBNP D-Dimer No results for input(s): DDIMER in the last 72 hours. Hemoglobin A1C No results for input(s): HGBA1C in the last 72 hours. Fasting Lipid Panel Recent Labs    05/25/20 0216  CHOL 121  HDL 48  LDLCALC 35  TRIG 189*  CHOLHDL 2.5   Thyroid Function Tests Recent Labs    05/24/20 2242  TSH 1.376   _____________  CARDIAC CATHETERIZATION  Result Date: 05/24/2020  Prox LAD lesion is 90% stenosed.  Ramus lesion is 50% stenosed.  Mid RCA lesion is 35% stenosed.  Post intervention, there is a 0% residual stenosis.  A drug-eluting stent was successfully placed using a STENT RESOLUTE ONYX 3.0X22.  LV end diastolic pressure is normal.  1. Single vessel occlusive CAD involving the proximal LAD 2. Normal LVEDP 3. Successful PCI of the proximal LAD with DES x 1 Plan: DAPT for one year. Risk factor modification. Will assess LV function with Echo.   ECHOCARDIOGRAM COMPLETE  Result Date: 05/25/2020    ECHOCARDIOGRAM REPORT   Patient Name:   Douglas [name] Date of Exam: 05/25/2020 Medical Rec #:  [id number]       Height:       73.0 in Accession #:    [id number]      Weight:       160.0 lb Date of Birth:  1955/10/21      BSA:          1.957 m Patient Age:    26 years        BP:           132/95 mmHg Patient Gender: M               HR:           84 bpm. Exam Location:  Inpatient Procedure: 2D Echo, Cardiac  Doppler and Color Doppler Indications:    Chest pain  History:        Patient has prior history of Echocardiogram examinations, most                 recent 07/21/2015. COPD; Risk Factors:Hypertension and                 Dyslipidemia.  Sonographer:    Clayton Lefort RDCS (AE) Referring Phys: [id number] [place]  1. Left ventricular ejection fraction, by estimation, is 35 to 40%. The left ventricle has moderately decreased function. The left  ventricle has no regional wall motion abnormalities. Left ventricular diastolic parameters are consistent with Grade I diastolic dysfunction (impaired relaxation). There is mild apical dyskinesis. There is severe hypokinesis of the entire anterior septum, but the basal and mid segments of the anterior wall have preserved contractility.  2. Right ventricular systolic function is normal. The right ventricular size is normal.  3. The mitral valve is normal in structure. No evidence of mitral valve regurgitation. No evidence of mitral stenosis.  4. The aortic valve is normal in structure. Aortic valve regurgitation is not visualized. No aortic stenosis is present.  5. The inferior vena cava is normal in size with greater than 50% respiratory variability, suggesting right atrial pressure of 3 mmHg. FINDINGS  Left Ventricle: Left ventricular ejection fraction, by estimation, is 35 to 40%. The left ventricle has moderately decreased function. The left ventricle has no regional wall motion abnormalities. The left ventricular internal cavity size was normal in size. There is no left ventricular hypertrophy. Left ventricular diastolic parameters are consistent with Grade I diastolic dysfunction (impaired relaxation). Normal left ventricular filling pressure.  LV Wall Scoring: There is mild apical dyskinesis. There is severe hypokinesis of the entire anterior septum, but the basal and mid segments of the anterior wall have preserved contractility. Right Ventricle: The right ventricular size is normal. No increase in right ventricular wall thickness. Right ventricular systolic function is normal. Left Atrium: Left atrial size was normal in size. Right Atrium: Right atrial size was normal in size. Pericardium: There is no evidence of pericardial effusion. Mitral Valve: The mitral valve is normal in structure. No evidence of mitral valve regurgitation. No evidence of mitral valve stenosis. MV peak gradient, 2.0 mmHg. The mean  mitral valve gradient is 1.0 mmHg. Tricuspid Valve: The tricuspid valve is normal in structure. Tricuspid valve regurgitation is not demonstrated. No evidence of tricuspid stenosis. Aortic Valve: The aortic valve is normal in structure. Aortic valve regurgitation is not visualized. No aortic stenosis is present. Aortic valve mean gradient measures 3.0 mmHg. Aortic valve peak gradient measures 6.0 mmHg. Aortic valve area, by VTI measures 2.55 cm. Pulmonic Valve: The pulmonic valve was normal in structure. Pulmonic valve regurgitation is not visualized. No evidence of pulmonic stenosis. Aorta: The aortic root is normal in size and structure. Venous: The inferior vena cava is normal in size with greater than 50% respiratory variability, suggesting right atrial pressure of 3 mmHg. IAS/Shunts: No atrial level shunt detected by color flow Doppler.  LEFT VENTRICLE PLAX 2D LVIDd:         3.90 cm  Diastology LVIDs:         2.90 cm  LV e' medial:    5.66 cm/s LV PW:         1.30 cm  LV E/e' medial:  8.4 LV IVS:        1.50 cm  LV e' lateral:  13.90 cm/s LVOT diam:     2.30 cm  LV E/e' lateral: 3.4 LV SV:         59 LV SV Index:   30 LVOT Area:     4.15 cm  RIGHT VENTRICLE             IVC RV Basal diam:  2.80 cm     IVC diam: 1.00 cm RV S prime:     11.20 cm/s TAPSE (M-mode): 3.0 cm LEFT ATRIUM           Index       RIGHT ATRIUM           Index LA diam:      2.60 cm 1.33 cm/m  RA Area:     12.30 cm LA Vol (A2C): 29.5 ml 15.07 ml/m RA Volume:   32.80 ml  16.76 ml/m LA Vol (A4C): 23.9 ml 12.21 ml/m  AORTIC VALVE AV Area (Vmax):    3.02 cm AV Area (Vmean):   2.69 cm AV Area (VTI):     2.55 cm AV Vmax:           122.00 cm/s AV Vmean:          85.200 cm/s AV VTI:            0.233 m AV Peak Grad:      6.0 mmHg AV Mean Grad:      3.0 mmHg LVOT Vmax:         88.80 cm/s LVOT Vmean:        55.200 cm/s LVOT VTI:          0.143 m LVOT/AV VTI ratio: 0.61  AORTA Ao Root diam: 3.50 cm MITRAL VALVE MV Area (PHT): 2.54 cm    SHUNTS  MV Peak grad:  2.0 mmHg    Systemic VTI:  0.14 m MV Mean grad:  1.0 mmHg    Systemic Diam: 2.30 cm MV Vmax:       0.71 m/s MV Vmean:      45.7 cm/s MV Decel Time: 299 msec MV E velocity: 47.60 cm/s MV A velocity: 53.30 cm/s MV E/A ratio:  0.89 Mihai Croitoru MD Electronically signed by Sanda Klein MD Signature Date/Time: 05/25/2020/2:22:37 PM    Final    EXERCISE TOLERANCE TEST (ETT)  Result Date: 05/11/2020  Blood pressure demonstrated a normal response to exercise.  There was no ST segment deviation noted during stress.  No T wave inversion was noted during stress.  Negative adequate stress test.   Disposition   Pt is being discharged home today in good condition.  Follow-up Plans & Appointments   Call [facility] at [phone] if any bleeding, swelling or drainage at cath site.  May shower, no tub baths for 48 hours for groin sticks. No lifting over 5 pounds for 5 days.  No Driving for 5 days if you drive  Take 1 NTG, under your tongue, while sitting.  If no relief of pain may repeat NTG, one tab every 5 minutes up to 3 tablets total over 15 minutes.  If no relief CALL 911.  If you have dizziness/lightheadness  while taking NTG, stop taking and call 911.        Heart Healthy diet   Do NOT stop asprin and plavix to keep stents open.  If you have questions on your Chemo please call [facility] or [facility].       Discharge Instructions     Amb Referral  to Cardiac Rehabilitation   Complete by: As directed    Diagnosis:  Coronary Stents STEMI PTCA     After initial evaluation and assessments completed: Virtual Based Care may be provided alone or in conjunction with Phase 2 Cardiac Rehab based on patient barriers.: Yes       Discharge Medications   Allergies as of 05/26/2020       Reactions   Avelox [moxifloxacin Hcl In Nacl] Hives, Shortness Of Breath, Swelling   Medical Adhesive Remover Dermatitis, Rash   Tape Dermatitis, Rash        Medication List      STOP taking these medications    atorvastatin 10 MG tablet Commonly known as: LIPITOR   losartan 100 MG tablet Commonly known as: COZAAR   NIFEdipine 30 MG 24 hr tablet Commonly known as: ADALAT CC   NIFEdipine 90 MG 24 hr tablet Commonly known as: ADALAT CC       TAKE these medications    aspirin 81 MG EC tablet Take 1 tablet (81 mg total) by mouth daily. Swallow whole. Start taking on: May 27, 2020   Avastin 100 MG/4ML Soln Generic drug: Bevacizumab Inject into the vein.   B COMPLEX 100 PO Take 100 mg by mouth daily.   carvedilol 3.125 MG tablet Commonly known as: COREG Take 1 tablet (3.125 mg total) by mouth 2 (two) times daily with a meal.   CENTRUM SILVER 50+MEN PO Take 1 tablet by mouth daily.   cetirizine 10 MG tablet Commonly known as: ZYRTEC Take 5 mg by mouth daily.   cholecalciferol 25 MCG (1000 UNIT) tablet Commonly known as: VITAMIN D3 Take 2,000 Units by mouth daily.   clopidogrel 75 MG tablet Commonly known as: PLAVIX Take 1 tablet (75 mg total) by mouth daily. Start taking on: May 27, 2020   doxycycline 50 MG tablet Commonly known as: ADOXA Take 50 mg by mouth daily.   DULoxetine 20 MG capsule Commonly known as: CYMBALTA TAKE 1 CAPSULE BY MOUTH EVERY DAY What changed: how much to take   erlotinib 150 MG tablet Commonly known as: TARCEVA Take 150 mg by mouth daily. Take on an empty stomach 1 hour before meals or 2 hours after   HYDROcodone-acetaminophen 7.5-325 MG tablet Commonly known as: NORCO Take 1-2 tablets every 4-6 hours as needed for pain. What changed:  how much to take how to take this when to take this additional instructions   loperamide 2 MG tablet Commonly known as: IMODIUM A-D Take 2 mg by mouth 4 (four) times daily as needed for diarrhea or loose stools.   LORazepam 1 MG tablet Commonly known as: ATIVAN Take 0.5-1 tablets (0.5-1 mg total) by mouth at bedtime as needed for sleep. TAKE ONE HALF  OR ONE TABLET BY MOUTH AT BEDTIME IF NEEDED FOR SLEEP, DO NOT TAKE WITH PAIN MEDS What changed:  how much to take when to take this additional instructions   nitroGLYCERIN 0.4 MG SL tablet Commonly known as: NITROSTAT Place 1 tablet (0.4 mg total) under the tongue every 5 (five) minutes x 3 doses as needed for chest pain.   ondansetron 4 MG disintegrating tablet Commonly known as: ZOFRAN-ODT TAKE 1 TABLET BY MOUTH SUBLINGUALLY EVERY 8 HOURS AS NEEDED FOR NAUSEA OR VOMITING What changed:  how much to take how to take this when to take this reasons to take this additional instructions   pantoprazole 20 MG tablet Commonly known as: PROTONIX Take 20 mg by mouth daily.  rosuvastatin 10 MG tablet Commonly known as: CRESTOR Take 1 tablet (10 mg total) by mouth daily.   sacubitril-valsartan 24-26 MG Commonly known as: ENTRESTO Take 1 tablet by mouth 2 (two) times daily. Start taking on: May 27, 2020   traZODone 50 MG tablet Commonly known as: DESYREL TAKE 0.5-1 TABLETS BY MOUTH AT BEDTIME AS NEEDED FOR SLEEP. What changed: See the new instructions.   Tums 500 MG chewable tablet Generic drug: calcium carbonate Chew 500 mg by mouth 3 (three) times daily as needed for indigestion or heartburn.   UNABLE TO FIND Take 1 tablet by mouth daily. Med Name: Calcium 600/D-3 300 2 daily   vitamin C 500 MG tablet Commonly known as: ASCORBIC ACID Take 500 mg by mouth daily.   Xgeva 120 MG/1.7ML Soln injection Generic drug: denosumab Inject 120 mg into the skin every 30 (thirty) days.           Outstanding Labs/Studies   Repeat echo after meds titrated and check hepatic and BMP in follow up visit.  Hx of solitary kidney   Duration of Discharge Encounter   Greater than 30 minutes including physician time.  Signed, Cecilie Kicks, NP 05/26/2020, 11:10 AM   Attending Note:   The patient was seen and examined.  Agree with assessment and plan as noted above.  Changes  made to the above note as needed.  Patient seen and independently examined with  Cecilie Kicks, NP .   We discussed all aspects of the encounter. I agree with the assessment and plan as stated above.     CAD :  pt was admitted with a large ant. MI.   Trop > 27,000 Echo showed moderate LV dysfunction with EF 35-40% He had severe dyspnea with brilinta,  we changed to plavix.  No further angina Ambulating without any difficulty.   Encouraged him to walk at home    2.   Acute combined CHF:   due to ant MI.    Losartan was stopped and replaced with Entresto 24-26 BID.  Coreg 3.125 bid was added I anticipate repeat echo in several months    3.  Renal cell ca  I called Franne Grip, ( nurse coordinator) and reported the details of his hospitalization and medication changes .  She is in agreement .   4.  Anxiety :  plans per primary md    I have spent a total of 40 minutes with patient reviewing hospital  notes , telemetry, EKGs, labs and examining patient as well as establishing an assessment and plan that was discussed with the patient.  > 50% of time was spent in direct patient care.    Thayer Headings, Brooke Bonito., MD, [facility] 05/27/2020, 12:42 PM [address],  [place] Pager [phone]

## 2020-05-26 NOTE — Plan of Care (Signed)
  Problem: Education: Goal: Understanding of cardiac disease, CV risk reduction, and recovery process will improve Outcome: Adequate for Discharge Goal: Understanding of medication regimen will improve Outcome: Adequate for Discharge Goal: Individualized Educational Video(s) Outcome: Adequate for Discharge   Problem: Activity: Goal: Ability to tolerate increased activity will improve Outcome: Adequate for Discharge   Problem: Cardiac: Goal: Ability to achieve and maintain adequate cardiopulmonary perfusion will improve Outcome: Adequate for Discharge Goal: Vascular access site(s) Level 0-1 will be maintained Outcome: Adequate for Discharge   Problem: Health Behavior/Discharge Planning: Goal: Ability to safely manage health-related needs after discharge will improve Outcome: Adequate for Discharge   

## 2020-05-26 NOTE — Telephone Encounter (Signed)
Patient wanted to know if he could drink Gatorade. He was told to go on a low sodium diet but has a hard time staying hydrated because of his cancer medications.  Please advise

## 2020-05-26 NOTE — Progress Notes (Signed)
CARDIOLOGY OFFICE NOTE  Date:  06/02/2020    Douglas [name] Date of Birth: 16-May-1956 Medical Record #[id number]  PCP:  Darreld Mclean, MD  Cardiologist:  Johnsie Cancel  Chief Complaint  Patient presents with  . Hospitalization Follow-up    Seen for [name]    History of Present Illness: Douglas [name] is a 64 y.o. male who presents today for a follow up visit. Seen for [name].   He has a history of hereditary leiomyomatosis and renal cell cancer - metastatic to the bone(hehas been in [facility] clinical trials for over 72 monthsand sees Oncology at [facility] and [facility]), HTN, HLD, PVD, and chronic L shoulder pain. He is on 3 study drugs -Tarceva, Avastin and Xgeva.  Last seen by [name] in October.  Had had GXT in November.   Presented to [facility] with intermittent epigastric discomfort earlier this month. Had seen PCP and EKG showed ST elevation - thus transferred. Not able to go directly to cath lab due to cancer meds and concern for bleeding. He was cleared with the [facility] nurse for anticoagulation and DAPT. Troponin >27,000. He did subsequently undergo cardiac cath - found to have LAD disease - otherwise non obstructive disease - had PCI with DES. Statin held due to elevated LFTs. EF dropped to 35 to 40%. He was placed on Entresto - Procardia and Losartan were stopped.      Comes in today. Here with his wife.  He has lots of concerns. He admits he is nervous since the recent event. Some shoulder soreness - has bad rotator cuff. He is worried about his stent moving in his chest. He has had some constipation. Took some senokot. He has lost weight - he needs to gain weight to stay in his trial. He is very anxious to get back on his study drugs - apparently these were stopped by [facility] - unclear what is safe to be using with his stent. Asking if we know anything about patients on Tarceva/Avastin who have had stents. He was walking about a mile a day - ok to gradually work  back to that. He would like to have several bottles of NTG to put in different places.    Past Medical History:  Diagnosis Date  . Allergic rhinitis   . Arthritis    "all over my body" (10/16/2013)  . Chronic back pain    "neck to lower back" (10/16/2013)  . Compression fracture    S/P L-SPINE; pt does not recall this hx on 10/16/2013  . COPD (chronic obstructive pulmonary disease) (HCC)    FVC .66  . Depression    "situational since 09/22/2013 OR"  . Encounter for long-term (current) use of other medications   . GERD (gastroesophageal reflux disease)   . Hemoptysis   . 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 History of blood transfusion 09/2013   "think so; not sure; related to big OR"  . History of surgical fusion joint    DDD C-SPINE  . Hyperlipidemia   . Hypertension   . Leiomyoma OF SKIN   INCREASED RISK RENAL CELL CA  NEEDS CT OF KIDNEYS EVERY 2 YEARS NEXT DUE  02/06/13  . Melanoma in situ ([place])   . Melanoma of back ([place])   . Neuromuscular disorder ([place])   . Osteoarthritis   . Renal calculi   . Renal cell carcinoma ([place])    type 2; papillary  . Tobacco use disorder   .  Unspecified vitamin D deficiency     Past Surgical History:  Procedure Laterality Date  . ANTERIOR CERVICAL DECOMP/DISCECTOMY FUSION  1992  . ANTERIOR CRUCIATE LIGAMENT REPAIR Bilateral [phone]  . APPENDECTOMY  09/22/2013  . CHOLECYSTECTOMY  09/22/2013   "had tumors in it"  . CORONARY STENT INTERVENTION N/A 05/24/2020   Procedure: CORONARY STENT INTERVENTION;  Surgeon: Martinique, Peter M, MD;  Location: [facility] [place] [facility];  Service: Cardiovascular;  Laterality: N/A;  . HAND LIGAMENT RECONSTRUCTION Right   . HARVEST BONE GRAFT Right 1992   "hip; for neck fusion"  . INGUINAL HERNIA REPAIR Right 2004  . LEFT HEART CATH AND CORONARY ANGIOGRAPHY N/A 05/24/2020   Procedure: LEFT HEART CATH AND CORONARY ANGIOGRAPHY;  Surgeon: Martinique, Peter M, MD;  Location: [place] [facility];   Service: Cardiovascular;  Laterality: N/A;  . LIGAMENT REPAIR Right ~ 2001   "little finger"  . LYMPH NODE DISSECTION Right 09/22/2013   "in the area of the kidney; they were clean"  . MELANOMA EXCISION  ~ 2007   "off my back"  . PARTIAL NEPHRECTOMY Right 09/22/2013  . removal of fascia overlying the right psoas Right 09/22/2013  . RENAL ARTERY STENT Right 09/22/2013  . RIGHT COLECTOMY Right 09/22/2013  . ROBOTIC ASSITED PARTIAL NEPHRECTOMY  05/22/2012   Procedure: ROBOTIC ASSITED PARTIAL NEPHRECTOMY;  Surgeon: Alexis Frock, MD;  Location: [facility];  Service: Urology;  Laterality: Right;  Right Robotic Cyst Decortication and Partial Nephrectomy   . SPINE SURGERY       Medications: Current Meds  Medication Sig  . aspirin EC 81 MG EC tablet Take 1 tablet (81 mg total) by mouth daily. Swallow whole.  . B Complex Vitamins (B COMPLEX 100 PO) Take 100 mg by mouth daily.  . Bevacizumab (AVASTIN) 100 MG/4ML SOLN Inject into the vein.  . calcium carbonate (TUMS - DOSED IN MG ELEMENTAL CALCIUM) 500 MG chewable tablet Chew 500 mg by mouth 3 (three) times daily as needed for indigestion or heartburn.  . carvedilol (COREG) 3.125 MG tablet Take 1 tablet (3.125 mg total) by mouth 2 (two) times daily with a meal.  . cetirizine (ZYRTEC) 10 MG tablet Take 5 mg by mouth daily.   . cholecalciferol (VITAMIN D3) 25 MCG (1000 UNIT) tablet Take 2,000 Units by mouth daily.  . clopidogrel (PLAVIX) 75 MG tablet Take 1 tablet (75 mg total) by mouth daily.  Marland Kitchen denosumab (XGEVA) 120 MG/1.7ML SOLN injection Inject 120 mg into the skin every 30 (thirty) days.   Marland Kitchen doxycycline (ADOXA) 50 MG tablet Take 50 mg by mouth daily.  . DULoxetine (CYMBALTA) 20 MG capsule TAKE 1 CAPSULE BY MOUTH EVERY DAY (Patient taking differently: Take 20 mg by mouth daily.)  . erlotinib (TARCEVA) 150 MG tablet Take 150 mg by mouth daily. Take on an empty stomach 1 hour before meals or 2 hours after  . HYDROcodone-acetaminophen (NORCO) 7.5-325 MG  tablet Take 1-2 tablets every 4-6 hours as needed for pain. (Patient taking differently: Take 2 tablets by mouth See admin instructions. 2 every 4 hours through out the day until taking ativan at bedtime, do not mix)  . loperamide (IMODIUM A-D) 2 MG tablet Take 2 mg by mouth 4 (four) times daily as needed for diarrhea or loose stools.  Marland Kitchen LORazepam (ATIVAN) 1 MG tablet Take 0.5-1 tablets (0.5-1 mg total) by mouth at bedtime as needed for sleep. TAKE ONE HALF OR ONE TABLET BY MOUTH AT BEDTIME IF NEEDED FOR SLEEP,  DO NOT TAKE WITH PAIN MEDS (Patient taking differently: Take 1 mg by mouth at bedtime.)  . Multiple Vitamins-Minerals (CENTRUM SILVER 50+MEN PO) Take 1 tablet by mouth daily.  . ondansetron (ZOFRAN-ODT) 4 MG disintegrating tablet TAKE 1 TABLET BY MOUTH SUBLINGUALLY EVERY 8 HOURS AS NEEDED FOR NAUSEA OR VOMITING (Patient taking differently: Take 4 mg by mouth every 8 (eight) hours as needed for nausea or vomiting.)  . pantoprazole (PROTONIX) 20 MG tablet Take 20 mg by mouth daily.  . rosuvastatin (CRESTOR) 10 MG tablet Take 1 tablet (10 mg total) by mouth daily.  . sacubitril-valsartan (ENTRESTO) 24-26 MG Take 1 tablet by mouth 2 (two) times daily.  . traZODone (DESYREL) 50 MG tablet TAKE 0.5-1 TABLETS BY MOUTH AT BEDTIME AS NEEDED FOR SLEEP. (Patient taking differently: Take 25 mg by mouth at bedtime.)  . UNABLE TO FIND Take 1 tablet by mouth daily. Med Name: Calcium 600/D-3 300 2 daily  . vitamin C (ASCORBIC ACID) 500 MG tablet Take 500 mg by mouth daily.  . [DISCONTINUED] nitroGLYCERIN (NITROSTAT) 0.4 MG SL tablet Place 1 tablet (0.4 mg total) under the tongue every 5 (five) minutes x 3 doses as needed for chest pain.     Allergies: Allergies  Allergen Reactions  . Avelox [Moxifloxacin Hcl In Nacl] Hives, Shortness Of Breath and Swelling  . Medical Adhesive Remover Dermatitis and Rash  . Tape Dermatitis and Rash    Social History: The patient  reports that he has quit smoking. His  smoking use included cigarettes. He has a 20.00 pack-year smoking history. He has never used smokeless tobacco. He reports current alcohol use. He reports current drug use. Drug: Marijuana.   Family History: The patient's family history includes Hypertension in his father. He was adopted.   Review of Systems: Please see the history of present illness.   All other systems are reviewed and negative.   Physical Exam: VS:  BP 112/78   Pulse 69   Ht 6\' 1"  (1.854 m)   Wt 163 lb (73.9 kg)   SpO2 98%   BMI 21.51 kg/m  .  BMI Body mass index is 21.51 kg/m.  Wt Readings from Last 3 Encounters:  06/02/20 163 lb (73.9 kg)  05/24/20 159 lb 15.8 oz (72.6 kg)  05/24/20 160 lb (72.6 kg)    General: Alert. He is in no acute distress. He is anxious. Looks chronically ill.    Cardiac: Regular rate and rhythm. No murmurs, rubs, or gallops. No edema.  Respiratory:  Lungs are clear to auscultation bilaterally with normal work of breathing.  GI: Soft and nontender.  MS: No deformity or atrophy. Gait and ROM intact.  Skin: Warm and dry. Color is normal.  Neuro:  Strength and sensation are intact and no gross focal deficits noted.  Psych: Alert, appropriate and with normal affect. Right wrist with some bruising - has 2+ pulse noted.   LABORATORY DATA:  EKG:  EKG is not ordered today.    Lab Results  Component Value Date   WBC 9.0 05/26/2020   HGB 11.5 (L) 05/26/2020   HCT 32.6 (L) 05/26/2020   PLT 222 05/26/2020   GLUCOSE 93 05/25/2020   CHOL 121 05/25/2020   TRIG 189 (H) 05/25/2020   HDL 48 05/25/2020   LDLDIRECT 63.0 05/01/2019   LDLCALC 35 05/25/2020   ALT 91 (H) 05/24/2020   AST 447 (H) 05/24/2020   NA 141 05/25/2020   K 3.8 05/25/2020   CL 109 05/25/2020  CREATININE 1.17 05/25/2020   BUN 36 (H) 05/25/2020   CO2 22 05/25/2020   TSH 1.376 05/24/2020   PSA 1.86 04/14/2020   INR 1.0 05/24/2020   HGBA1C 5.2 05/29/2016     BNP (last 3 results) No results for input(s): BNP in  the last 8760 hours.  ProBNP (last 3 results) No results for input(s): PROBNP in the last 8760 hours.   Other Studies Reviewed Today:  Cardiac cath 05/24/20   Prox LAD lesion is 90% stenosed.  Ramus lesion is 50% stenosed.  Mid RCA lesion is 35% stenosed.  Post intervention, there is a 0% residual stenosis.  A drug-eluting stent was successfully placed using a STENT RESOLUTE ONYX 3.0X22.  LV end diastolic pressure is normal.  1. Single vessel occlusive CAD involving the proximal LAD 2. Normal LVEDP 3. Successful PCI of the proximal LAD with DES x 1  Plan: DAPT for one year. Risk factor modification. Will assess LV function with Echo.  Echo Impression 05/25/20  1. Left ventricular ejection fraction, by estimation, is 35 to 40%. The  left ventricle has moderately decreased function. The left ventricle has  no regional wall motion abnormalities. Left ventricular diastolic  parameters are consistent with Grade I  diastolic dysfunction (impaired relaxation). There is mild apical  dyskinesis. There is severe hypokinesis of the entire anterior septum, but  the basal and mid segments of the anterior wall have preserved  contractility.  2. Right ventricular systolic function is normal. The right ventricular  size is normal.  3. The mitral valve is normal in structure. No evidence of mitral valve  regurgitation. No evidence of mitral stenosis.  4. The aortic valve is normal in structure. Aortic valve regurgitation is  not visualized. No aortic stenosis is present.  5. The inferior vena cava is normal in size with greater than 50%  respiratory variability, suggesting right atrial pressure of 3 mmHg.    Assessment/Plan: 1. STEMI - with one vessel CAD - s/p PCI - on DAPT - doing ok. Needs labs today.   2. ICM - chronic systolic HF - looks euvolemic - he is trying to gain weight - already was watching his sodium - he is on Entresto - encouraged to weigh daily.   3. HLD -  he is on statin - he needs to get his LFTs checked - may need to consider lipid clinic referral.   4. Elevated LFTs - will recheck today  5. Hereditary leiomyomatosis and renal cell cancer with mets - followed by [facility] and [facility] - apparently off his study drugs with the recent stent. Unclear to me. Very challenging situation for him.   Current medicines are reviewed with the patient today.  The patient does not have concerns regarding medicines other than what has been noted above.  The following changes have been made:  See above.  Labs/ tests ordered today include:    Orders Placed This Encounter  Procedures  . Lipid panel  . Basic metabolic panel  . CBC     Disposition:   FU with [name] in about 3 to 6 weeks. His overall course is tenuous.    Patient is agreeable to this plan and will call if any problems develop in the interim.   SignedTruitt Merle, NP  06/02/2020 2:31 PM  [facility] [address] [place], [place]  [address] Phone: [phone] Fax: [phone]

## 2020-05-27 MED FILL — Ondansetron HCl Inj 4 MG/2ML (2 MG/ML): INTRAMUSCULAR | Qty: 2 | Status: AC

## 2020-05-27 NOTE — Telephone Encounter (Signed)
error 

## 2020-05-27 NOTE — Telephone Encounter (Signed)
Ok to drink gatorade

## 2020-06-02 ENCOUNTER — Other Ambulatory Visit: Payer: Self-pay

## 2020-06-02 ENCOUNTER — Ambulatory Visit: Payer: Medicare HMO | Admitting: Physician Assistant

## 2020-06-02 ENCOUNTER — Encounter: Payer: Self-pay | Admitting: Nurse Practitioner

## 2020-06-02 ENCOUNTER — Ambulatory Visit: Payer: Medicare HMO | Admitting: Nurse Practitioner

## 2020-06-02 VITALS — BP 112/78 | HR 69 | Ht 73.0 in | Wt 163.0 lb

## 2020-06-02 DIAGNOSIS — R748 Abnormal levels of other serum enzymes: Secondary | ICD-10-CM | POA: Diagnosis not present

## 2020-06-02 DIAGNOSIS — I255 Ischemic cardiomyopathy: Secondary | ICD-10-CM

## 2020-06-02 DIAGNOSIS — Z5181 Encounter for therapeutic drug level monitoring: Secondary | ICD-10-CM | POA: Diagnosis not present

## 2020-06-02 DIAGNOSIS — I2102 ST elevation (STEMI) myocardial infarction involving left anterior descending coronary artery: Secondary | ICD-10-CM

## 2020-06-02 DIAGNOSIS — R7989 Other specified abnormal findings of blood chemistry: Secondary | ICD-10-CM

## 2020-06-02 DIAGNOSIS — E785 Hyperlipidemia, unspecified: Secondary | ICD-10-CM | POA: Diagnosis not present

## 2020-06-02 MED ORDER — NITROGLYCERIN 0.4 MG SL SUBL: 0.4000 mg | SUBLINGUAL_TABLET | SUBLINGUAL | 1 refills | Status: DC | PRN

## 2020-06-02 NOTE — Patient Instructions (Addendum)
After Visit Summary:  We will be checking the following labs today - BMET, CBC and HPF  Medication Instructions:    Continue with your current medicines.   I refilled your NTG today for 4 small bottles.    If you need a refill on your cardiac medications before your next appointment, please call your pharmacy.     Testing/Procedures To Be Arranged:  N/A  Follow-Up:   See [name] in about 3 to 6 weeks.      At [facility], you and your health needs are our priority.  As part of our continuing mission to provide you with exceptional heart care, we have created designated Provider Care Teams.  These Care Teams include your primary Cardiologist (physician) and Advanced Practice Providers (APPs -  Physician Assistants and Nurse Practitioners) who all work together to provide you with the care you need, when you need it.  Special Instructions:  . Stay safe, wash your hands for at least 20 seconds and wear a mask when needed.  . It was good to talk with you today.  . I will send a message to our pharmacist about your study drugs and your stent   Call the [place] office at [phone] if you have any questions, problems or concerns.

## 2020-06-03 ENCOUNTER — Telehealth: Payer: Self-pay

## 2020-06-03 DIAGNOSIS — R7989 Other specified abnormal findings of blood chemistry: Secondary | ICD-10-CM

## 2020-06-03 LAB — LIPID PANEL
Chol/HDL Ratio: 2.9 ratio (ref 0.0–5.0)
Cholesterol, Total: 146 mg/dL (ref 100–199)
HDL: 50 mg/dL (ref >39)
LDL Chol Calc (NIH): 57 mg/dL (ref 0–99)
Triglycerides: 244 mg/dL — ABNORMAL HIGH (ref 0–149)
VLDL Cholesterol Cal: 39 mg/dL (ref 5–40)

## 2020-06-03 LAB — CBC
Hematocrit: 38 % (ref 37.5–51.0)
Hemoglobin: 12.9 g/dL — ABNORMAL LOW (ref 13.0–17.7)
MCH: 32.7 pg (ref 26.6–33.0)
MCHC: 33.9 g/dL (ref 31.5–35.7)
MCV: 96 fL (ref 79–97)
Platelets: 348 10*3/uL (ref 150–450)
RBC: 3.95 x10E6/uL — ABNORMAL LOW (ref 4.14–5.80)
RDW: 12.8 % (ref 11.6–15.4)
WBC: 9.9 10*3/uL (ref 3.4–10.8)

## 2020-06-03 LAB — BASIC METABOLIC PANEL
BUN/Creatinine Ratio: 22 (ref 10–24)
BUN: 34 mg/dL — ABNORMAL HIGH (ref 8–27)
CO2: 21 mmol/L (ref 20–29)
Calcium: 9.6 mg/dL (ref 8.6–10.2)
Chloride: 106 mmol/L (ref 96–106)
Creatinine, Ser: 1.53 mg/dL — ABNORMAL HIGH (ref 0.76–1.27)
GFR calc Af Amer: 54 mL/min/{1.73_m2} — ABNORMAL LOW (ref >59)
GFR calc non Af Amer: 47 mL/min/{1.73_m2} — ABNORMAL LOW (ref >59)
Glucose: 104 mg/dL — ABNORMAL HIGH (ref 65–99)
Potassium: 5.5 mmol/L — ABNORMAL HIGH (ref 3.5–5.2)
Sodium: 140 mmol/L (ref 134–144)

## 2020-06-03 NOTE — Telephone Encounter (Signed)
-----   Message from Burtis Junes, NP sent at 06/03/2020 11:55 AM EST ----- Can you see if his LFTs from yesterday were ordered? If not - any way to add.  I do not know if Raquel Sarna is working today.   We were in [place] yesterday.   [name]

## 2020-06-03 NOTE — Telephone Encounter (Signed)
Noted.  Thanks Duchess Armendarez!

## 2020-06-03 NOTE — Telephone Encounter (Addendum)
Hepatic panel added to the patients blood work.   Add on order faxed to [facility].. spoke with Katrina.

## 2020-06-03 NOTE — Progress Notes (Signed)
Thanks [name].  [name]

## 2020-06-04 ENCOUNTER — Telehealth: Payer: Self-pay

## 2020-06-04 DIAGNOSIS — I10 Essential (primary) hypertension: Secondary | ICD-10-CM

## 2020-06-04 LAB — HEPATIC FUNCTION PANEL
ALT: 32 IU/L (ref 0–44)
AST: 26 IU/L (ref 0–40)
Albumin: 3.8 g/dL (ref 3.8–4.8)
Alkaline Phosphatase: 61 IU/L (ref 44–121)
Bilirubin Total: <0.2 mg/dL (ref 0.0–1.2)
Bilirubin, Direct: <0.1 mg/dL (ref 0.00–0.40)
Total Protein: 5.9 g/dL — ABNORMAL LOW (ref 6.0–8.5)

## 2020-06-04 LAB — SPECIMEN STATUS REPORT

## 2020-06-04 NOTE — Telephone Encounter (Signed)
-----   Message from Burtis Junes, NP sent at 06/04/2020 11:25 AM EST ----- Please let him know that his liver tests are totally normal! This is great news. Stay on current regimen.

## 2020-06-04 NOTE — Telephone Encounter (Signed)
Douglas Junes, NP  06/03/2020 7:51 AM EST      We need to get the LFTs - little worsening renal function and elevated potassium.  Let him know that [name] was going to call him in regards to his study drugs and new stent.  BMET in one week

## 2020-06-08 ENCOUNTER — Encounter: Payer: Self-pay | Admitting: Family Medicine

## 2020-06-08 DIAGNOSIS — F329 Major depressive disorder, single episode, unspecified: Secondary | ICD-10-CM

## 2020-06-08 DIAGNOSIS — C641 Malignant neoplasm of right kidney, except renal pelvis: Secondary | ICD-10-CM

## 2020-06-08 DIAGNOSIS — G893 Neoplasm related pain (acute) (chronic): Secondary | ICD-10-CM

## 2020-06-08 MED ORDER — LORAZEPAM 1 MG PO TABS
0.5000 mg | ORAL_TABLET | Freq: Every evening | ORAL | 5 refills | Status: DC | PRN
Start: 2020-06-08 — End: 2020-12-23

## 2020-06-08 MED ORDER — HYDROCODONE-ACETAMINOPHEN 7.5-325 MG PO TABS: 2.0000 | ORAL_TABLET | ORAL | 0 refills | Status: DC

## 2020-06-10 ENCOUNTER — Encounter (HOSPITAL_COMMUNITY): Payer: Self-pay

## 2020-06-10 ENCOUNTER — Other Ambulatory Visit: Payer: Self-pay

## 2020-06-10 ENCOUNTER — Telehealth (HOSPITAL_COMMUNITY): Payer: Self-pay

## 2020-06-10 ENCOUNTER — Other Ambulatory Visit: Payer: Medicare HMO | Admitting: *Deleted

## 2020-06-10 DIAGNOSIS — I10 Essential (primary) hypertension: Secondary | ICD-10-CM | POA: Diagnosis not present

## 2020-06-10 NOTE — Telephone Encounter (Signed)
Attempted to call patient in regards to Cardiac Rehab - LM on VM Mailed letter 

## 2020-06-11 LAB — BASIC METABOLIC PANEL
BUN/Creatinine Ratio: 23 (ref 10–24)
BUN: 30 mg/dL — ABNORMAL HIGH (ref 8–27)
CO2: 22 mmol/L (ref 20–29)
Calcium: 9.7 mg/dL (ref 8.6–10.2)
Chloride: 104 mmol/L (ref 96–106)
Creatinine, Ser: 1.32 mg/dL — ABNORMAL HIGH (ref 0.76–1.27)
GFR calc Af Amer: 65 mL/min/{1.73_m2} (ref >59)
GFR calc non Af Amer: 56 mL/min/{1.73_m2} — ABNORMAL LOW (ref >59)
Glucose: 88 mg/dL (ref 65–99)
Potassium: 5.2 mmol/L (ref 3.5–5.2)
Sodium: 139 mmol/L (ref 134–144)

## 2020-06-16 ENCOUNTER — Telehealth (HOSPITAL_COMMUNITY): Payer: Self-pay

## 2020-06-16 NOTE — Telephone Encounter (Signed)
Pt returned CR phone and stated he is interested in CR. Patient will come in for orientation on 07/20/20 @ 10AM and will attend the 130PM exercise class.  Pensions consultant.

## 2020-06-22 DIAGNOSIS — Z7689 Persons encountering health services in other specified circumstances: Secondary | ICD-10-CM | POA: Diagnosis not present

## 2020-06-25 DIAGNOSIS — I252 Old myocardial infarction: Secondary | ICD-10-CM | POA: Diagnosis not present

## 2020-06-25 DIAGNOSIS — Z1509 Genetic susceptibility to other malignant neoplasm: Secondary | ICD-10-CM | POA: Diagnosis not present

## 2020-06-25 DIAGNOSIS — C7951 Secondary malignant neoplasm of bone: Secondary | ICD-10-CM | POA: Diagnosis not present

## 2020-06-25 DIAGNOSIS — C649 Malignant neoplasm of unspecified kidney, except renal pelvis: Secondary | ICD-10-CM | POA: Diagnosis not present

## 2020-06-25 DIAGNOSIS — Z79899 Other long term (current) drug therapy: Secondary | ICD-10-CM | POA: Diagnosis not present

## 2020-06-25 DIAGNOSIS — R809 Proteinuria, unspecified: Secondary | ICD-10-CM | POA: Diagnosis not present

## 2020-06-28 DIAGNOSIS — I2510 Atherosclerotic heart disease of native coronary artery without angina pectoris: Secondary | ICD-10-CM | POA: Diagnosis not present

## 2020-06-28 DIAGNOSIS — I129 Hypertensive chronic kidney disease with stage 1 through stage 4 chronic kidney disease, or unspecified chronic kidney disease: Secondary | ICD-10-CM | POA: Diagnosis not present

## 2020-06-28 DIAGNOSIS — Z1509 Genetic susceptibility to other malignant neoplasm: Secondary | ICD-10-CM | POA: Diagnosis not present

## 2020-06-28 DIAGNOSIS — R69 Illness, unspecified: Secondary | ICD-10-CM | POA: Diagnosis not present

## 2020-06-28 DIAGNOSIS — R809 Proteinuria, unspecified: Secondary | ICD-10-CM | POA: Diagnosis not present

## 2020-06-28 DIAGNOSIS — N182 Chronic kidney disease, stage 2 (mild): Secondary | ICD-10-CM | POA: Diagnosis not present

## 2020-06-29 NOTE — Progress Notes (Signed)
CARDIOLOGY OFFICE NOTE  Date:  07/02/2020    Arlyce Harman Date of Birth: July 24, 1955 Medical Record #[id number]  PCP:  Darreld Mclean, MD  Cardiologist:  Johnsie Cancel  No chief complaint on file.   History of Present Illness:  65 y.o. 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 Followed at [place] [facility] for years  In clinical trials. Has been on Tarceva and Avastin However had STEMI with DES to proximal LAD 05/24/20 And residual 50% Ramus. EF was 35-40% Due to CAD and DAT he was taken off his Avastin and Tarceva both contraindicated with ischemic heart disease. Started on Entresto and coreg  Discussed medical Rx for his ischemic cardiomyopathy. Home BP readings only around 786 systolic with pulses In 76'H. No room to push coreg or entresto. He has one kidney with Cr 1.3 and K 5.5 in hospital with no volume excess So think aldactone would be dangerous   He is to [facility] this [facility] to see what alternative RX is possible for his cancer.  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    Past Medical History:  Diagnosis Date  . Allergic rhinitis   . Arthritis    "all over my body" (10/16/2013)  . Chronic back pain    "neck to lower back" (10/16/2013)  . Compression fracture    S/P L-SPINE; pt does not recall this hx on 10/16/2013  . COPD (chronic obstructive pulmonary disease) (HCC)    FVC .66  . Depression    "situational since 09/22/2013 OR"  . Encounter for long-term (current) use of other medications   . GERD (gastroesophageal reflux disease)   . Hemoptysis   . 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)    . History of blood transfusion 09/2013   "think so; not sure; related to big OR"  . History of surgical fusion joint    DDD C-SPINE  . Hyperlipidemia   . Hypertension   . Leiomyoma OF SKIN   INCREASED RISK RENAL CELL CA  NEEDS CT OF KIDNEYS EVERY 2 YEARS NEXT DUE  02/06/13  . Melanoma in situ ([place])   . Melanoma of back ([place])   . Neuromuscular disorder ([place])   . Osteoarthritis   . Renal calculi   . Renal cell carcinoma ([place])    type 2; papillary  . Tobacco use disorder   . Unspecified vitamin D deficiency     Past Surgical History:  Procedure Laterality Date  . ANTERIOR CERVICAL DECOMP/DISCECTOMY FUSION  1992  . ANTERIOR CRUCIATE LIGAMENT REPAIR Bilateral [phone]  . APPENDECTOMY  09/22/2013  . CHOLECYSTECTOMY  09/22/2013   "had tumors in it"  . CORONARY STENT INTERVENTION N/A 05/24/2020   Procedure: CORONARY STENT INTERVENTION;  Surgeon: Martinique, Keila Turan M, MD;  Location: [facility];  Service: Cardiovascular;  Laterality: N/A;  . HAND LIGAMENT RECONSTRUCTION Right   . HARVEST BONE GRAFT Right 1992   "hip; for neck fusion"  . INGUINAL HERNIA REPAIR Right 2004  . LEFT HEART CATH AND CORONARY ANGIOGRAPHY N/A 05/24/2020   Procedure: LEFT HEART CATH AND CORONARY ANGIOGRAPHY;  Surgeon:  Martinique, Kerston Landeck M, MD;  Location: [facility];  Service: Cardiovascular;  Laterality: N/A;  . LIGAMENT REPAIR Right ~ 2001   "little finger"  . LYMPH NODE DISSECTION Right 09/22/2013   "in the area of the kidney; they were clean"  . MELANOMA EXCISION  ~ 2007   "off my back"  . PARTIAL NEPHRECTOMY Right 09/22/2013  . removal of fascia overlying the right psoas Right 09/22/2013  . RENAL ARTERY STENT Right 09/22/2013  . RIGHT COLECTOMY Right 09/22/2013  . ROBOTIC ASSITED PARTIAL NEPHRECTOMY  05/22/2012   Procedure: ROBOTIC ASSITED PARTIAL NEPHRECTOMY;  Surgeon: Alexis Frock, MD;  Location: [facility];  Service: Urology;  Laterality: Right;  Right Robotic Cyst Decortication and Partial  Nephrectomy   . SPINE SURGERY       Medications: Current Meds  Medication Sig  . aspirin EC 81 MG EC tablet Take 1 tablet (81 mg total) by mouth daily. Swallow whole.  . B Complex Vitamins (B COMPLEX 100 PO) Take 100 mg by mouth daily.  . Bevacizumab (AVASTIN) 100 MG/4ML SOLN Inject into the vein.  . calcium carbonate (TUMS - DOSED IN MG ELEMENTAL CALCIUM) 500 MG chewable tablet Chew 500 mg by mouth 3 (three) times daily as needed for indigestion or heartburn.  . carvedilol (COREG) 3.125 MG tablet Take 1 tablet (3.125 mg total) by mouth 2 (two) times daily with a meal.  . cetirizine (ZYRTEC) 10 MG tablet Take 5 mg by mouth daily.   . clopidogrel (PLAVIX) 75 MG tablet Take 1 tablet (75 mg total) by mouth daily.  Marland Kitchen denosumab (XGEVA) 120 MG/1.7ML SOLN injection Inject 120 mg into the skin every 30 (thirty) days.   . DULoxetine (CYMBALTA) 20 MG capsule TAKE 1 CAPSULE BY MOUTH EVERY DAY  . HYDROcodone-acetaminophen (NORCO) 7.5-325 MG tablet Take 2 tablets by mouth See admin instructions. 2 every 4 hours through out the day until taking ativan at bedtime, do not mix  . loperamide (IMODIUM A-D) 2 MG tablet Take 2 mg by mouth 4 (four) times daily as needed for diarrhea or loose stools.  Marland Kitchen LORazepam (ATIVAN) 1 MG tablet Take 0.5-1 tablets (0.5-1 mg total) by mouth at bedtime as needed for sleep. TAKE ONE HALF OR ONE TABLET BY MOUTH AT BEDTIME IF NEEDED FOR SLEEP, DO NOT TAKE WITH PAIN MEDS  . Multiple Vitamins-Minerals (CENTRUM SILVER 50+MEN PO) Take 1 tablet by mouth daily.  . nitroGLYCERIN (NITROSTAT) 0.4 MG SL tablet Place 1 tablet (0.4 mg total) under the tongue every 5 (five) minutes x 3 doses as needed for chest pain. Please dispense #25 x 4 bottle  . ondansetron (ZOFRAN-ODT) 4 MG disintegrating tablet TAKE 1 TABLET BY MOUTH SUBLINGUALLY EVERY 8 HOURS AS NEEDED FOR NAUSEA OR VOMITING  . pantoprazole (PROTONIX) 20 MG tablet Take 20 mg by mouth daily.  . rosuvastatin (CRESTOR) 10 MG tablet Take 1  tablet (10 mg total) by mouth daily.  . sacubitril-valsartan (ENTRESTO) 24-26 MG Take 1 tablet by mouth 2 (two) times daily.  . traZODone (DESYREL) 50 MG tablet TAKE 0.5-1 TABLETS BY MOUTH AT BEDTIME AS NEEDED FOR SLEEP.  Marland Kitchen UNABLE TO FIND Take 1 tablet by mouth daily. Med Name: Calcium 600/D-3 300 2 daily  . vitamin C (ASCORBIC ACID) 500 MG tablet Take 500 mg by mouth daily.     Allergies: Allergies  Allergen Reactions  . Avelox [Moxifloxacin Hcl In Nacl] Hives, Shortness Of Breath and Swelling  . Medical Adhesive Remover Dermatitis and Rash  . Tape  Dermatitis and Rash    Social History: The patient  reports that he has quit smoking. His smoking use included cigarettes. He has a 20.00 pack-year smoking history. He has never used smokeless tobacco. He reports current alcohol use. He reports current drug use. Drug: Marijuana.   Family History: The patient's family history includes Hypertension in his father. He was adopted.   Review of Systems: Please see the history of present illness.   All other systems are reviewed and negative.   Physical Exam: VS:  BP 122/76   Pulse 79   Ht 6\' 1"  (1.854 m)   Wt 74.7 kg   SpO2 98%   BMI 21.72 kg/m  .  BMI Body mass index is 21.72 kg/m.  Wt Readings from Last 3 Encounters:  07/02/20 74.7 kg  06/02/20 73.9 kg  05/24/20 72.6 kg   Affect appropriate Healthy:  appears stated age 10: normal Neck supple with no adenopathy JVP normal no bruits no thyromegaly Lungs clear with no wheezing and good diaphragmatic motion Heart:  S1/S2 no murmur, no rub, gallop or click PMI normal Abdomen: benighn, BS positve, no tenderness, no AAA no bruit.  No HSM or HJR Distal pulses intact with no bruits No edema Neuro non-focal Skin warm and dry No muscular weakness   LABORATORY DATA:  EKG:   05/25/20 SR acute anterolateral ST elevation MI  Lab Results  Component Value Date   WBC 9.9 06/02/2020   HGB 12.9 (L) 06/02/2020   HCT 38.0  06/02/2020   PLT 348 06/02/2020   GLUCOSE 88 06/10/2020   CHOL 146 06/02/2020   TRIG 244 (H) 06/02/2020   HDL 50 06/02/2020   LDLDIRECT 63.0 05/01/2019   LDLCALC 57 06/02/2020   ALT 32 06/02/2020   AST 26 06/02/2020   NA 139 06/10/2020   K 5.2 06/10/2020   CL 104 06/10/2020   CREATININE 1.32 (H) 06/10/2020   BUN 30 (H) 06/10/2020   CO2 22 06/10/2020   TSH 1.376 05/24/2020   PSA 1.86 04/14/2020   INR 1.0 05/24/2020   HGBA1C 5.2 05/29/2016     BNP (last 3 results) No results for input(s): BNP in the last 8760 hours.  ProBNP (last 3 results) No results for input(s): PROBNP in the last 8760 hours.   Other Studies Reviewed Today:  Cardiac cath 05/24/20   Prox LAD lesion is 90% stenosed.  Ramus lesion is 50% stenosed.  Mid RCA lesion is 35% stenosed.  Post intervention, there is a 0% residual stenosis.  A drug-eluting stent was successfully placed using a STENT RESOLUTE ONYX 3.0X22.  LV end diastolic pressure is normal.  1. Single vessel occlusive CAD involving the proximal LAD 2. Normal LVEDP 3. Successful PCI of the proximal LAD with DES x 1  Plan: DAPT for one year. Risk factor modification. Will assess LV function with Echo.  Echo Impression 05/25/20  1. Left ventricular ejection fraction, by estimation, is 35 to 40%. The  left ventricle has moderately decreased function. The left ventricle has  no regional wall motion abnormalities. Left ventricular diastolic  parameters are consistent with Grade I  diastolic dysfunction (impaired relaxation). There is mild apical  dyskinesis. There is severe hypokinesis of the entire anterior septum, but  the basal and mid segments of the anterior wall have preserved  contractility.  2. Right ventricular systolic function is normal. The right ventricular  size is normal.  3. The mitral valve is normal in structure. No evidence of mitral valve  regurgitation.  No evidence of mitral stenosis.  4. The aortic valve  is normal in structure. Aortic valve regurgitation is  not visualized. No aortic stenosis is present.  5. The inferior vena cava is normal in size with greater than 50%  respiratory variability, suggesting right atrial pressure of 3 mmHg.    Assessment/Plan: 1. STEMI -  05/24/20 with one vessel CAD - s/p PCI - on DAPT has caused issue with using Tarceva and avastin for his cancer as both of these are contraindicated and not recommended for use with CAD  2. ICM - chronic systolic HF - euvolemic on entresto and coreg  F/U echo  April  3 months post intervention To assess EF 35-40% by TTE 05/25/20   3. HLD - he is on statin - he needs to get his LFTs checked - may need to consider lipid clinic referral.   4. Elevated LFTs - resolved normal 06/02/20 likely related to acute MI  5. Hereditary leiomyomatosis and renal cell cancer with mets - followed by [facility] and [facility] - apparently off his study drugs with the recent stent. Unclear to me. Very challenging situation for him.   Current medicines are reviewed with the patient today.  The patient does not have concerns regarding medicines other than what has been noted above.  The following changes have been made:  See above.  Labs/ tests ordered today include:   BMET  Echo April 2022   No orders of the defined types were placed in this encounter.    Disposition:   FU 23months    Signed: Jenkins Rouge, MD  07/02/2020 11:12 AM  [facility] [address] [place], [place]  [address] Phone: [phone] Fax: [phone]

## 2020-06-30 ENCOUNTER — Telehealth (HOSPITAL_COMMUNITY): Payer: Self-pay

## 2020-06-30 NOTE — Telephone Encounter (Signed)
Pt insurance is active and benefits verified through [facility]. Co-pay $45.00, DED $0.00/$0.00 met, out of pocket $4,500.00/$0.00 met, co-insurance 0%. No pre-authorization required. Passport, 06/30/20 @ 3:53PM, GJF#[id number]

## 2020-07-02 ENCOUNTER — Encounter: Payer: Self-pay | Admitting: Cardiovascular Disease

## 2020-07-02 ENCOUNTER — Other Ambulatory Visit: Payer: Self-pay

## 2020-07-02 ENCOUNTER — Ambulatory Visit: Payer: Medicare HMO | Admitting: Cardiovascular Disease

## 2020-07-02 VITALS — BP 122/76 | HR 79 | Ht 73.0 in | Wt 164.6 lb

## 2020-07-02 DIAGNOSIS — I255 Ischemic cardiomyopathy: Secondary | ICD-10-CM

## 2020-07-02 NOTE — Patient Instructions (Addendum)
Medication Instructions:  *If you need a refill on your cardiac medications before your next appointment, please call your pharmacy*  Testing/Procedures: Your physician has requested that you have an echocardiogram in April 2022. Echocardiography is a painless test that uses sound waves to create images of your heart. It provides your doctor with information about the size and shape of your heart and how well your heart's chambers and valves are working. This procedure takes approximately one hour. There are no restrictions for this procedure.  Follow-Up: At [facility], you and your health needs are our priority.  As part of our continuing mission to provide you with exceptional heart care, we have created designated Provider Care Teams.  These Care Teams include your primary Cardiologist (physician) and Advanced Practice Providers (APPs -  Physician Assistants and Nurse Practitioners) who all work together to provide you with the care you need, when you need it.  We recommend signing up for the patient portal called "MyChart".  Sign up information is provided on this After Visit Summary.  MyChart is used to connect with patients for Virtual Visits (Telemedicine).  Patients are able to view lab/test results, encounter notes, upcoming appointments, etc.  Non-urgent messages can be sent to your provider as well.   To learn more about what you can do with MyChart, go to [email or web address].    Your next appointment:   Your physician recommends that you schedule a follow-up appointment in: April 2022 (same day as echo) with [name].  The format for your next appointment:   In Person with Jenkins Rouge, MD

## 2020-07-07 ENCOUNTER — Encounter: Payer: Self-pay | Admitting: Family Medicine

## 2020-07-07 DIAGNOSIS — C641 Malignant neoplasm of right kidney, except renal pelvis: Secondary | ICD-10-CM

## 2020-07-07 DIAGNOSIS — G893 Neoplasm related pain (acute) (chronic): Secondary | ICD-10-CM

## 2020-07-07 MED ORDER — HYDROCODONE-ACETAMINOPHEN 7.5-325 MG PO TABS: 2.0000 | ORAL_TABLET | ORAL | 0 refills | Status: DC

## 2020-07-08 DIAGNOSIS — M19012 Primary osteoarthritis, left shoulder: Secondary | ICD-10-CM | POA: Diagnosis not present

## 2020-07-08 NOTE — Telephone Encounter (Signed)
Patient was seen in clinic on 07/02/20

## 2020-07-09 ENCOUNTER — Telehealth (HOSPITAL_COMMUNITY): Payer: Self-pay

## 2020-07-13 ENCOUNTER — Other Ambulatory Visit: Payer: Self-pay

## 2020-07-13 ENCOUNTER — Telehealth (HOSPITAL_COMMUNITY): Payer: Self-pay

## 2020-07-13 ENCOUNTER — Encounter (HOSPITAL_COMMUNITY)
Admission: RE | Admit: 2020-07-13 | Discharge: 2020-07-13 | Disposition: A | Discharge status: Home / Self Care | Payer: Medicare HMO | Source: Ambulatory Visit | Attending: Cardiovascular Disease | Admitting: Cardiovascular Disease

## 2020-07-13 DIAGNOSIS — I214 Non-ST elevation (NSTEMI) myocardial infarction: Secondary | ICD-10-CM

## 2020-07-13 NOTE — Progress Notes (Signed)
Cardiac Rehab Telephone Note:  Successful telephone encounter to Douglas [name] to confirm Cardiac Rehab orientation appointment for 07/20/20 at 10:00 am. Nursing assessment completed. Patient questions answered. Instructions for appointment provided. Patient screening for Covid-19 negative.  Nathalie Cavendish E. Rollene Rotunda RN, BSN [place]. [facility]  Cardiac and Pulmonary Rehabilitation Phone: [phone] Fax: [phone]

## 2020-07-13 NOTE — Telephone Encounter (Signed)
Cardiac Rehab Telephone Note:  Unsuccessful telephone encounter to Douglas [name] to confirm cardiac rehab orientation appointment 06/20/20 at 10:00 am and to complete telephone health assessment prior to appointment. [employer] compliant VM left requesting call back to [phone].  Smantha Boakye E. Rollene Rotunda RN, BSN . [facility]  Cardiac and Pulmonary Rehabilitation Phone: [phone] Fax: [phone]

## 2020-07-19 NOTE — Telephone Encounter (Signed)
Cardiac Rehab Medication Review by a Pharmacist  Does the patient feel that his/her medications are working for him/her?  yes  Has the patient been experiencing any side effects to the medications prescribed?  no  Does the patient measure his/her own blood pressure or blood glucose at home?  Yes, he checks his blood pressure once daily.  Says his BP has been 110's/70's  Does the patient have any problems obtaining medications due to transportation or finances?   no  Understanding of regimen: excellent Understanding of indications: excellent Potential of compliance: excellent    Pharmacist Intervention: n/a    Douglas [name] 07/19/2020 2:39 PM

## 2020-07-20 ENCOUNTER — Ambulatory Visit: Payer: Medicare HMO | Admitting: Cardiovascular Disease

## 2020-07-20 ENCOUNTER — Encounter (HOSPITAL_COMMUNITY)
Admission: RE | Admit: 2020-07-20 | Discharge: 2020-07-20 | Disposition: A | Discharge status: Home / Self Care | Payer: Medicare HMO | Source: Ambulatory Visit | Attending: Cardiovascular Disease | Admitting: Cardiovascular Disease

## 2020-07-20 ENCOUNTER — Other Ambulatory Visit: Payer: Self-pay

## 2020-07-20 ENCOUNTER — Encounter (HOSPITAL_COMMUNITY): Payer: Self-pay

## 2020-07-20 VITALS — BP 104/70 | HR 82 | Ht 72.0 in | Wt 163.4 lb

## 2020-07-20 DIAGNOSIS — I2102 ST elevation (STEMI) myocardial infarction involving left anterior descending coronary artery: Secondary | ICD-10-CM

## 2020-07-20 DIAGNOSIS — I252 Old myocardial infarction: Secondary | ICD-10-CM | POA: Insufficient documentation

## 2020-07-20 DIAGNOSIS — Z955 Presence of coronary angioplasty implant and graft: Secondary | ICD-10-CM | POA: Insufficient documentation

## 2020-07-20 HISTORY — DX: Atherosclerotic heart disease of native coronary artery without angina pectoris: I25.10

## 2020-07-20 NOTE — Progress Notes (Signed)
Cardiac Individual Treatment Plan  Patient Details  Name: Douglas [name] MRN: [id number] Date of Birth: August 16, 1955 Referring Provider:   Flowsheet Row CARDIAC REHAB PHASE II ORIENTATION from 07/20/2020 in [place]  Referring Provider Jenkins Rouge, MD      Initial Encounter Date:  [place] PHASE II ORIENTATION from 07/20/2020 in [facility]  Date 07/20/20      Visit Diagnosis: 05/24/20 STEMI   05/24/20 S/P DES LAD  Patient's Home Medications on Admission:  Current Outpatient Medications:  .  HM LIDOCAINE PATCH EX, Apply 5 % topically as needed., Disp: , Rfl:  .  aspirin EC 81 MG EC tablet, Take 1 tablet (81 mg total) by mouth daily. Swallow whole., Disp: 30 tablet, Rfl: 11 .  B Complex Vitamins (B COMPLEX 100 PO), Take 100 mg by mouth daily., Disp: , Rfl:  .  Bevacizumab (AVASTIN) 100 MG/4ML SOLN, Inject into the vein. (Patient not taking: Reported on 07/19/2020), Disp: , Rfl:  .  calcium carbonate (TUMS - DOSED IN MG ELEMENTAL CALCIUM) 500 MG chewable tablet, Chew 500 mg by mouth 3 (three) times daily as needed for indigestion or heartburn., Disp: , Rfl:  .  carvedilol (COREG) 3.125 MG tablet, Take 1 tablet (3.125 mg total) by mouth 2 (two) times daily with a meal., Disp: 60 tablet, Rfl: 6 .  cetirizine (ZYRTEC) 10 MG tablet, Take 5 mg by mouth daily. , Disp: , Rfl:  .  cholecalciferol (VITAMIN D3) 25 MCG (1000 UNIT) tablet, Take 2,000 Units by mouth daily., Disp: , Rfl:  .  clopidogrel (PLAVIX) 75 MG tablet, Take 1 tablet (75 mg total) by mouth daily., Disp: 30 tablet, Rfl: 11 .  denosumab (XGEVA) 120 MG/1.7ML SOLN injection, Inject 120 mg into the skin every 30 (thirty) days. , Disp: , Rfl:  .  DULoxetine (CYMBALTA) 20 MG capsule, TAKE 1 CAPSULE BY MOUTH EVERY DAY, Disp: 90 capsule, Rfl: 3 .  HYDROcodone-acetaminophen (NORCO) 7.5-325 MG tablet, Take 2 tablets by mouth See admin instructions. 2 every 4  hours through out the day until taking ativan at bedtime, do not mix, Disp: 240 tablet, Rfl: 0 .  loperamide (IMODIUM A-D) 2 MG tablet, Take 2 mg by mouth 4 (four) times daily as needed for diarrhea or loose stools., Disp: , Rfl:  .  LORazepam (ATIVAN) 1 MG tablet, Take 0.5-1 tablets (0.5-1 mg total) by mouth at bedtime as needed for sleep. TAKE ONE HALF OR ONE TABLET BY MOUTH AT BEDTIME IF NEEDED FOR SLEEP, DO NOT TAKE WITH PAIN MEDS, Disp: 30 tablet, Rfl: 5 .  Multiple Vitamins-Minerals (CENTRUM SILVER 50+MEN PO), Take 1 tablet by mouth daily., Disp: , Rfl:  .  nitroGLYCERIN (NITROSTAT) 0.4 MG SL tablet, Place 1 tablet (0.4 mg total) under the tongue every 5 (five) minutes x 3 doses as needed for chest pain. Please dispense #25 x 4 bottle, Disp: 100 tablet, Rfl: 1 .  ondansetron (ZOFRAN-ODT) 4 MG disintegrating tablet, TAKE 1 TABLET BY MOUTH SUBLINGUALLY EVERY 8 HOURS AS NEEDED FOR NAUSEA OR VOMITING, Disp: 40 tablet, Rfl: 2 .  pantoprazole (PROTONIX) 20 MG tablet, Take 20 mg by mouth daily., Disp: , Rfl:  .  rosuvastatin (CRESTOR) 10 MG tablet, Take 1 tablet (10 mg total) by mouth daily., Disp: 30 tablet, Rfl: 6 .  sacubitril-valsartan (ENTRESTO) 24-26 MG, Take 1 tablet by mouth 2 (two) times daily., Disp: 60 tablet, Rfl: 6 .  traZODone (DESYREL) 50 MG  tablet, TAKE 0.5-1 TABLETS BY MOUTH AT BEDTIME AS NEEDED FOR SLEEP., Disp: 90 tablet, Rfl: 2 .  UNABLE TO FIND, Take 1 tablet by mouth daily. Med Name: Calcium 600/D-3 300 2 daily, Disp: , Rfl:  .  vitamin C (ASCORBIC ACID) 500 MG tablet, Take 500 mg by mouth daily., Disp: , Rfl:   Past Medical History: Past Medical History:  Diagnosis Date  . Allergic rhinitis   . Arthritis    "all over my body" (10/16/2013)  . Chronic back pain    "neck to lower back" (10/16/2013)  . Compression fracture    S/P L-SPINE; pt does not recall this hx on 10/16/2013  . COPD (chronic obstructive pulmonary disease) (HCC)    FVC .66  . Coronary artery disease   .  Depression    "situational since 09/22/2013 OR"  . Encounter for long-term (current) use of other medications   . GERD (gastroesophageal reflux disease)   . Hemoptysis   . 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 History of blood transfusion 09/2013   "think so; not sure; related to big OR"  . History of surgical fusion joint    DDD C-SPINE  . Hyperlipidemia   . Hypertension   . Leiomyoma OF SKIN   INCREASED RISK RENAL CELL CA  NEEDS CT OF KIDNEYS EVERY 2 YEARS NEXT DUE  02/06/13  . Melanoma in situ ([place])   . Melanoma of back ([place])   . Neuromuscular disorder ([place])   . Osteoarthritis   . Renal calculi   . 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 Tobacco use disorder   . Unspecified vitamin D deficiency     Tobacco Use: Social History   Tobacco Use  Smoking Status Former Smoker  . Packs/day: 0.50  . Years: 40.00  . Pack years: 20.00  . Types: Cigarettes  Smokeless Tobacco Never Used  Tobacco Comment   Pt chewing Nicorette gum currently    Labs: Recent Review Flowsheet Data    Labs for ITP Cardiac and Pulmonary Rehab Latest Ref Rng & Units 04/30/2017 02/11/2018 05/01/2019 05/25/2020 06/02/2020   Cholestrol 100 - 199 mg/dL 176 164 175 121 146   LDLCALC 0 - 99 mg/dL - - - 35 57   LDLDIRECT mg/dL 64.0 60.0 63.0 - -   HDL >39 mg/dL 62.70 66.50 60.60 48 50   Trlycerides 0 - 149 mg/dL 311.0(H) 209.0(H) 241.0(H) 189(H) 244(H)   Hemoglobin A1c 4.6 - 6.5 % - - - - -      Capillary Blood Glucose: No results found for: GLUCAP   Exercise Target Goals: Exercise Program Goal: Individual exercise prescription set using results from initial 6 min walk test and THRR while considering  patient's activity barriers and safety.   Exercise Prescription Goal: Starting with aerobic activity 30 plus minutes a day, 3 days per week for initial exercise prescription. Provide home exercise prescription and guidelines that participant acknowledges understanding  prior to discharge.  Activity Barriers & Risk Stratification:  Activity Barriers & Cardiac Risk Stratification - 07/20/20 1212      Activity Barriers & Cardiac Risk Stratification   Activity Barriers Back Problems;Neck/Spine Problems;Joint Problems;Deconditioning;Muscular Weakness;Balance Concerns;Other (comment)    Comments Renal Cell Carcinoma with metastasis to bone    Cardiac Risk Stratification High           6 Minute Walk:  6 Minute Walk    Row Name 07/20/20 1210  6 Minute Walk   Phase Initial     Distance 1400 feet     Walk Time 6 minutes     # of Rest Breaks 0     MPH 2.7     METS 3.5     RPE 11     Perceived Dyspnea  0     VO2 Peak 12.16     Symptoms Yes (comment)     Comments Chronic Left shoulder pain 4/10     Resting HR 68 bpm     Resting BP 104/70     Resting Oxygen Saturation  98 %     Exercise Oxygen Saturation  during 6 min walk 97 %     Max Ex. HR 81 bpm     Max Ex. BP 110/64     2 Minute Post BP 108/70            Oxygen Initial Assessment:   Oxygen Re-Evaluation:   Oxygen Discharge (Final Oxygen Re-Evaluation):   Initial Exercise Prescription:  Initial Exercise Prescription - 07/20/20 1200      Date of Initial Exercise RX and Referring Provider   Date 07/20/20    Referring Provider Jenkins Rouge, MD    Expected Discharge Date 09/18/19      Recumbant Bike   Level 2    Minutes 15    METs 2.3      Track   Laps 12    Minutes 15    METs 2.39      Prescription Details   Frequency (times per week) 3    Duration Progress to 30 minutes of continuous aerobic without signs/symptoms of physical distress      Intensity   THRR 40-80% of Max Heartrate 62-124    Ratings of Perceived Exertion 11-13    Perceived Dyspnea 0-4      Progression   Progression Continue progressive overload as per policy without signs/symptoms or physical distress.      Resistance Training   Training Prescription Yes    Weight 2 lbs    Reps 10-15            Perform Capillary Blood Glucose checks as needed.  Exercise Prescription Changes:   Exercise Comments:   Exercise Goals and Review:  Exercise Goals    Row Name 07/20/20 1216             Exercise Goals   Increase Physical Activity Yes       Intervention Provide advice, education, support and counseling about physical activity/exercise needs.;Develop an individualized exercise prescription for aerobic and resistive training based on initial evaluation findings, risk stratification, comorbidities and participant's personal goals.       Expected Outcomes Short Term: Attend rehab on a regular basis to increase amount of physical activity.;Long Term: Add in home exercise to make exercise part of routine and to increase amount of physical activity.;Long Term: Exercising regularly at least 3-5 days a week.       Increase Strength and Stamina Yes       Intervention Provide advice, education, support and counseling about physical activity/exercise needs.;Develop an individualized exercise prescription for aerobic and resistive training based on initial evaluation findings, risk stratification, comorbidities and participant's personal goals.       Expected Outcomes Short Term: Increase workloads from initial exercise prescription for resistance, speed, and METs.;Short Term: Perform resistance training exercises routinely during rehab and add in resistance training at home;Long Term: Improve cardiorespiratory fitness, muscular endurance and strength as  measured by increased METs and functional capacity (6MWT)       Able to understand and use rate of perceived exertion (RPE) scale Yes       Intervention Provide education and explanation on how to use RPE scale       Expected Outcomes Short Term: Able to use RPE daily in rehab to express subjective intensity level;Long Term:  Able to use RPE to guide intensity level when exercising independently       Knowledge and understanding of Target  Heart Rate Range (THRR) Yes       Intervention Provide education and explanation of THRR including how the numbers were predicted and where they are located for reference       Expected Outcomes Short Term: Able to state/look up THRR;Short Term: Able to use daily as guideline for intensity in rehab;Long Term: Able to use THRR to govern intensity when exercising independently       Understanding of Exercise Prescription Yes       Intervention Provide education, explanation, and written materials on patient's individual exercise prescription       Expected Outcomes Short Term: Able to explain program exercise prescription;Long Term: Able to explain home exercise prescription to exercise independently              Exercise Goals Re-Evaluation :    Discharge Exercise Prescription (Final Exercise Prescription Changes):   Nutrition:  Target Goals: Understanding of nutrition guidelines, daily intake of sodium 1500mg , cholesterol 200mg , calories 30% from fat and 7% or less from saturated fats, daily to have 5 or more servings of fruits and vegetables.  Biometrics:  Pre Biometrics - 07/20/20 1000      Pre Biometrics   Waist Circumference 38 inches    Hip Circumference 40.5 inches    Waist to Hip Ratio 0.94 %    Triceps Skinfold 12 mm    % Body Fat 23.3 %    Grip Strength 40 kg    Flexibility --   Not done due to hernia, wearing truss   Single Leg Stand 5.5 seconds            Nutrition Therapy Plan and Nutrition Goals:   Nutrition Assessments:  MEDIFICTS Score Key:  ?70 Need to make dietary changes   40-70 Heart Healthy Diet  ? 40 Therapeutic Level Cholesterol Diet   Picture Your Plate Scores:  <00 Unhealthy dietary pattern with much room for improvement.  41-50 Dietary pattern unlikely to meet recommendations for good health and room for improvement.  51-60 More healthful dietary pattern, with some room for improvement.   >60 Healthy dietary pattern, although  there may be some specific behaviors that could be improved.    Nutrition Goals Re-Evaluation:   Nutrition Goals Discharge (Final Nutrition Goals Re-Evaluation):   Psychosocial: Target Goals: Acknowledge presence or absence of significant depression and/or stress, maximize coping skills, provide positive support system. Participant is able to verbalize types and ability to use techniques and skills needed for reducing stress and depression.  Initial Review & Psychosocial Screening:  Initial Psych Review & Screening - 07/20/20 1439      Initial Review   Current issues with History of Depression;Current Stress Concerns;Current Anxiety/Panic    Source of Stress Concerns Chronic Illness;Unable to participate in former interests or hobbies;Unable to perform yard/household activities    Comments [name] has Renal cell carcinoma and is getting treatment at the [facility] in [place]  Support System? Yes   Offie has his wife for support     Barriers   Psychosocial barriers to participate in program The patient should benefit from training in stress management and relaxation.      Screening Interventions   Interventions Encouraged to exercise;To provide support and resources with identified psychosocial needs;Provide feedback about the scores to participant    Expected Outcomes Long Term Goal: Stressors or current issues are controlled or eliminated.;Long Term goal: The participant improves quality of Life and PHQ9 Scores as seen by post scores and/or verbalization of changes;Short Term goal: Identification and review with participant of any Quality of Life or Depression concerns found by scoring the questionnaire.           Quality of Life Scores:  Quality of Life - 07/20/20 1207      Quality of Life   Select Quality of Life      Quality of Life Scores   Health/Function Pre 20.03 %    Socioeconomic Pre 22.57 %    Psych/Spiritual Pre 20.14 %    Family Pre 24.5 %     GLOBAL Pre 21.03 %          Scores of 19 and below usually indicate a poorer quality of life in these areas.  A difference of  2-3 points is a clinically meaningful difference.  A difference of 2-3 points in the total score of the Quality of Life Index has been associated with significant improvement in overall quality of life, self-image, physical symptoms, and general health in studies assessing change in quality of life.  PHQ-9: Recent Review Flowsheet Data    Depression screen [facility] 2/9 07/20/2020 06/03/2016 03/06/2016 01/17/2016 12/20/2015   Decreased Interest 0 0 0 0 0   Down, Depressed, Hopeless 0 0 0 0 0   PHQ - 2 Score 0 0 0 0 0     Interpretation of Total Score  Total Score Depression Severity:  1-4 = Minimal depression, 5-9 = Mild depression, 10-14 = Moderate depression, 15-19 = Moderately severe depression, 20-27 = Severe depression   Psychosocial Evaluation and Intervention:   Psychosocial Re-Evaluation:   Psychosocial Discharge (Final Psychosocial Re-Evaluation):   Vocational Rehabilitation: Provide vocational rehab assistance to qualifying candidates.   Vocational Rehab Evaluation & Intervention:   Education: Education Goals: Education classes will be provided on a weekly basis, covering required topics. Participant will state understanding/return demonstration of topics presented.  Learning Barriers/Preferences:  Learning Barriers/Preferences - 07/20/20 1207      Learning Barriers/Preferences   Learning Barriers Sight   wears glasses   Learning Preferences None           Education Topics: Hypertension, Hypertension Reduction -Define heart disease and high blood pressure. Discus how high blood pressure affects the body and ways to reduce high blood pressure.   Exercise and Your Heart -Discuss why it is important to exercise, the FITT principles of exercise, normal and abnormal responses to exercise, and how to exercise safely.   Angina -Discuss  definition of angina, causes of angina, treatment of angina, and how to decrease risk of having angina.   Cardiac Medications -Review what the following cardiac medications are used for, how they affect the body, and side effects that may occur when taking the medications.  Medications include Aspirin, Beta blockers, calcium channel blockers, ACE Inhibitors, angiotensin receptor blockers, diuretics, digoxin, and antihyperlipidemics.   Congestive Heart Failure -Discuss the definition of CHF, how to live with CHF, the signs and symptoms  of CHF, and how keep track of weight and sodium intake.   Heart Disease and Intimacy -Discus the effect sexual activity has on the heart, how changes occur during intimacy as we age, and safety during sexual activity.   Smoking Cessation / COPD -Discuss different methods to quit smoking, the health benefits of quitting smoking, and the definition of COPD.   Nutrition I: Fats -Discuss the types of cholesterol, what cholesterol does to the heart, and how cholesterol levels can be controlled.   Nutrition II: Labels -Discuss the different components of food labels and how to read food label   Heart Parts/Heart Disease and PAD -Discuss the anatomy of the heart, the pathway of blood circulation through the heart, and these are affected by heart disease.   Stress I: Signs and Symptoms -Discuss the causes of stress, how stress may lead to anxiety and depression, and ways to limit stress.   Stress II: Relaxation -Discuss different types of relaxation techniques to limit stress.   Warning Signs of Stroke / TIA -Discuss definition of a stroke, what the signs and symptoms are of a stroke, and how to identify when someone is having stroke.   Knowledge Questionnaire Score:  Knowledge Questionnaire Score - 07/20/20 1209      Knowledge Questionnaire Score   Pre Score 21/24           Core Components/Risk Factors/Patient Goals at Admission:  Personal  Goals and Risk Factors at Admission - 07/20/20 1445      Core Components/Risk Factors/Patient Goals on Admission    Weight Management Weight Maintenance;Yes    Intervention Weight Management: Provide education and appropriate resources to help participant work on and attain dietary goals.    Expected Outcomes Weight Maintenance: Understanding of the daily nutrition guidelines, which includes 25-35% calories from fat, 7% or less cal from saturated fats, less than 200mg  cholesterol, less than 1.5gm of sodium, & 5 or more servings of fruits and vegetables daily;Understanding of distribution of calorie intake throughout the day with the consumption of 4-5 meals/snacks;Understanding recommendations for meals to include 15-35% energy as protein, 25-35% energy from fat, 35-60% energy from carbohydrates, less than 200mg  of dietary cholesterol, 20-35 gm of total fiber daily    Hypertension Yes    Intervention Provide education on lifestyle modifcations including regular physical activity/exercise, weight management, moderate sodium restriction and increased consumption of fresh fruit, vegetables, and low fat dairy, alcohol moderation, and smoking cessation.;Monitor prescription use compliance.    Expected Outcomes Short Term: Continued assessment and intervention until BP is < 140/78mm HG in hypertensive participants. < 130/25mm HG in hypertensive participants with diabetes, heart failure or chronic kidney disease.;Long Term: Maintenance of blood pressure at goal levels.    Lipids Yes    Intervention Provide education and support for participant on nutrition & aerobic/resistive exercise along with prescribed medications to achieve LDL 70mg , HDL >40mg .    Expected Outcomes Short Term: Participant states understanding of desired cholesterol values and is compliant with medications prescribed. Participant is following exercise prescription and nutrition guidelines.;Long Term: Cholesterol controlled with medications  as prescribed, with individualized exercise RX and with personalized nutrition plan. Value goals: LDL < 70mg , HDL > 40 mg.    Stress Yes    Intervention Refer participants experiencing significant psychosocial distress to appropriate mental health specialists for further evaluation and treatment. When possible, include family members and significant others in education/counseling sessions.    Expected Outcomes Short Term: Participant demonstrates changes in health-related behavior, relaxation and other  stress management skills, ability to obtain effective social support, and compliance with psychotropic medications if prescribed.;Long Term: Emotional wellbeing is indicated by absence of clinically significant psychosocial distress or social isolation.           Core Components/Risk Factors/Patient Goals Review:    Core Components/Risk Factors/Patient Goals at Discharge (Final Review):    ITP Comments:  ITP Comments    Row Name 07/20/20 1425           ITP Comments Dr Fransico Him MD, Medical Director              Comments: Patient attended orientation on 07/20/2020 to review rules and guidelines for program.  Completed 6 minute walk test, Intitial ITP, and exercise prescription.  VSS. Telemetry- Sinus Rhythm. Patient reported chronic left shoulder pain otherwise Asymptomatic. Safety measures and social distancing in place per CDC guidelines.Barnet Pall, RN,BSN 07/20/2020 3:28 PM

## 2020-07-21 ENCOUNTER — Encounter: Payer: Self-pay | Admitting: Family Medicine

## 2020-07-23 DIAGNOSIS — K4090 Unilateral inguinal hernia, without obstruction or gangrene, not specified as recurrent: Secondary | ICD-10-CM | POA: Diagnosis not present

## 2020-07-23 DIAGNOSIS — R809 Proteinuria, unspecified: Secondary | ICD-10-CM | POA: Diagnosis not present

## 2020-07-23 DIAGNOSIS — I252 Old myocardial infarction: Secondary | ICD-10-CM | POA: Diagnosis not present

## 2020-07-23 DIAGNOSIS — C7951 Secondary malignant neoplasm of bone: Secondary | ICD-10-CM | POA: Diagnosis not present

## 2020-07-23 DIAGNOSIS — C649 Malignant neoplasm of unspecified kidney, except renal pelvis: Secondary | ICD-10-CM | POA: Diagnosis not present

## 2020-07-23 DIAGNOSIS — D580 Hereditary spherocytosis: Secondary | ICD-10-CM | POA: Diagnosis not present

## 2020-07-23 DIAGNOSIS — Z79899 Other long term (current) drug therapy: Secondary | ICD-10-CM | POA: Diagnosis not present

## 2020-07-23 DIAGNOSIS — Z1509 Genetic susceptibility to other malignant neoplasm: Secondary | ICD-10-CM | POA: Diagnosis not present

## 2020-07-26 ENCOUNTER — Other Ambulatory Visit: Payer: Self-pay

## 2020-07-26 ENCOUNTER — Encounter (HOSPITAL_COMMUNITY)
Admission: RE | Admit: 2020-07-26 | Discharge: 2020-07-26 | Disposition: A | Discharge status: Home / Self Care | Payer: Medicare HMO | Source: Ambulatory Visit | Attending: Cardiovascular Disease | Admitting: Cardiovascular Disease

## 2020-07-26 DIAGNOSIS — Z955 Presence of coronary angioplasty implant and graft: Secondary | ICD-10-CM

## 2020-07-26 DIAGNOSIS — I2102 ST elevation (STEMI) myocardial infarction involving left anterior descending coronary artery: Secondary | ICD-10-CM

## 2020-07-26 DIAGNOSIS — I252 Old myocardial infarction: Secondary | ICD-10-CM | POA: Diagnosis not present

## 2020-07-26 NOTE — Progress Notes (Incomplete)
Cardiac Individual Treatment Plan  Patient Details  Name: Douglas [name] MRN: [id number] Date of Birth: 05-03-1956 Referring Provider:   Flowsheet Row CARDIAC REHAB PHASE II ORIENTATION from 07/20/2020 in [place]  Referring Provider Jenkins Rouge, MD      Initial Encounter Date:  [facility] PHASE II ORIENTATION from 07/20/2020 in [place]  Date 07/20/20      Visit Diagnosis: 05/24/20 STEMI   05/24/20 S/P DES LAD  Patient's Home Medications on Admission:  Current Outpatient Medications:  .  aspirin EC 81 MG EC tablet, Take 1 tablet (81 mg total) by mouth daily. Swallow whole., Disp: 30 tablet, Rfl: 11 .  B Complex Vitamins (B COMPLEX 100 PO), Take 100 mg by mouth daily., Disp: , Rfl:  .  Bevacizumab (AVASTIN) 100 MG/4ML SOLN, Inject into the vein. (Patient not taking: Reported on 07/19/2020), Disp: , Rfl:  .  calcium carbonate (TUMS - DOSED IN MG ELEMENTAL CALCIUM) 500 MG chewable tablet, Chew 500 mg by mouth 3 (three) times daily as needed for indigestion or heartburn., Disp: , Rfl:  .  carvedilol (COREG) 3.125 MG tablet, Take 1 tablet (3.125 mg total) by mouth 2 (two) times daily with a meal., Disp: 60 tablet, Rfl: 6 .  cetirizine (ZYRTEC) 10 MG tablet, Take 5 mg by mouth daily. , Disp: , Rfl:  .  cholecalciferol (VITAMIN D3) 25 MCG (1000 UNIT) tablet, Take 2,000 Units by mouth daily., Disp: , Rfl:  .  clopidogrel (PLAVIX) 75 MG tablet, Take 1 tablet (75 mg total) by mouth daily., Disp: 30 tablet, Rfl: 11 .  denosumab (XGEVA) 120 MG/1.7ML SOLN injection, Inject 120 mg into the skin every 30 (thirty) days. , Disp: , Rfl:  .  DULoxetine (CYMBALTA) 20 MG capsule, TAKE 1 CAPSULE BY MOUTH EVERY DAY, Disp: 90 capsule, Rfl: 3 .  HM LIDOCAINE PATCH EX, Apply 5 % topically as needed., Disp: , Rfl:  .  HYDROcodone-acetaminophen (NORCO) 7.5-325 MG tablet, Take 2 tablets by mouth See admin instructions. 2 every 4  hours through out the day until taking ativan at bedtime, do not mix, Disp: 240 tablet, Rfl: 0 .  loperamide (IMODIUM A-D) 2 MG tablet, Take 2 mg by mouth 4 (four) times daily as needed for diarrhea or loose stools., Disp: , Rfl:  .  LORazepam (ATIVAN) 1 MG tablet, Take 0.5-1 tablets (0.5-1 mg total) by mouth at bedtime as needed for sleep. TAKE ONE HALF OR ONE TABLET BY MOUTH AT BEDTIME IF NEEDED FOR SLEEP, DO NOT TAKE WITH PAIN MEDS, Disp: 30 tablet, Rfl: 5 .  Multiple Vitamins-Minerals (CENTRUM SILVER 50+MEN PO), Take 1 tablet by mouth daily., Disp: , Rfl:  .  nitroGLYCERIN (NITROSTAT) 0.4 MG SL tablet, Place 1 tablet (0.4 mg total) under the tongue every 5 (five) minutes x 3 doses as needed for chest pain. Please dispense #25 x 4 bottle, Disp: 100 tablet, Rfl: 1 .  ondansetron (ZOFRAN-ODT) 4 MG disintegrating tablet, TAKE 1 TABLET BY MOUTH SUBLINGUALLY EVERY 8 HOURS AS NEEDED FOR NAUSEA OR VOMITING, Disp: 40 tablet, Rfl: 2 .  pantoprazole (PROTONIX) 20 MG tablet, Take 20 mg by mouth daily., Disp: , Rfl:  .  rosuvastatin (CRESTOR) 10 MG tablet, Take 1 tablet (10 mg total) by mouth daily., Disp: 30 tablet, Rfl: 6 .  sacubitril-valsartan (ENTRESTO) 24-26 MG, Take 1 tablet by mouth 2 (two) times daily., Disp: 60 tablet, Rfl: 6 .  traZODone (DESYREL) 50 MG  tablet, TAKE 0.5-1 TABLETS BY MOUTH AT BEDTIME AS NEEDED FOR SLEEP., Disp: 90 tablet, Rfl: 2 .  UNABLE TO FIND, Take 1 tablet by mouth daily. Med Name: Calcium 600/D-3 300 2 daily, Disp: , Rfl:  .  vitamin C (ASCORBIC ACID) 500 MG tablet, Take 500 mg by mouth daily., Disp: , Rfl:   Past Medical History: Past Medical History:  Diagnosis Date  . Allergic rhinitis   . Arthritis    "all over my body" (10/16/2013)  . Chronic back pain    "neck to lower back" (10/16/2013)  . Compression fracture    S/P L-SPINE; pt does not recall this hx on 10/16/2013  . COPD (chronic obstructive pulmonary disease) (HCC)    FVC .66  . Coronary artery disease   .  Depression    "situational since 09/22/2013 OR"  . Encounter for long-term (current) use of other medications   . GERD (gastroesophageal reflux disease)   . Hemoptysis   . 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 History of blood transfusion 09/2013   "think so; not sure; related to big OR"  . History of surgical fusion joint    DDD C-SPINE  . Hyperlipidemia   . Hypertension   . Leiomyoma OF SKIN   INCREASED RISK RENAL CELL CA  NEEDS CT OF KIDNEYS EVERY 2 YEARS NEXT DUE  02/06/13  . Melanoma in situ ([place])   . Melanoma of back ([place])   . Neuromuscular disorder ([place])   . Osteoarthritis   . Renal calculi   . 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 Tobacco use disorder   . Unspecified vitamin D deficiency     Tobacco Use: Social History   Tobacco Use  Smoking Status Former Smoker  . Packs/day: 0.50  . Years: 40.00  . Pack years: 20.00  . Types: Cigarettes  Smokeless Tobacco Never Used  Tobacco Comment   Pt chewing Nicorette gum currently    Labs: Recent Review Flowsheet Data    Labs for ITP Cardiac and Pulmonary Rehab Latest Ref Rng & Units 04/30/2017 02/11/2018 05/01/2019 05/25/2020 06/02/2020   Cholestrol 100 - 199 mg/dL 176 164 175 121 146   LDLCALC 0 - 99 mg/dL - - - 35 57   LDLDIRECT mg/dL 64.0 60.0 63.0 - -   HDL >39 mg/dL 62.70 66.50 60.60 48 50   Trlycerides 0 - 149 mg/dL 311.0(H) 209.0(H) 241.0(H) 189(H) 244(H)   Hemoglobin A1c 4.6 - 6.5 % - - - - -      Capillary Blood Glucose: No results found for: GLUCAP   Exercise Target Goals: Exercise Program Goal: Individual exercise prescription set using results from initial 6 min walk test and THRR while considering  patient's activity barriers and safety.   Exercise Prescription Goal: Starting with aerobic activity 30 plus minutes a day, 3 days per week for initial exercise prescription. Provide home exercise prescription and guidelines that participant acknowledges understanding  prior to discharge.  Activity Barriers & Risk Stratification:  Activity Barriers & Cardiac Risk Stratification - 07/20/20 1212      Activity Barriers & Cardiac Risk Stratification   Activity Barriers Back Problems;Neck/Spine Problems;Joint Problems;Deconditioning;Muscular Weakness;Balance Concerns;Other (comment)    Comments Renal Cell Carcinoma with metastasis to bone    Cardiac Risk Stratification High           6 Minute Walk:  6 Minute Walk    Row Name 07/20/20 1210  6 Minute Walk   Phase Initial     Distance 1400 feet     Walk Time 6 minutes     # of Rest Breaks 0     MPH 2.7     METS 3.5     RPE 11     Perceived Dyspnea  0     VO2 Peak 12.16     Symptoms Yes (comment)     Comments Chronic Left shoulder pain 4/10     Resting HR 68 bpm     Resting BP 104/70     Resting Oxygen Saturation  98 %     Exercise Oxygen Saturation  during 6 min walk 97 %     Max Ex. HR 81 bpm     Max Ex. BP 110/64     2 Minute Post BP 108/70            Oxygen Initial Assessment:   Oxygen Re-Evaluation:   Oxygen Discharge (Final Oxygen Re-Evaluation):   Initial Exercise Prescription:  Initial Exercise Prescription - 07/20/20 1200      Date of Initial Exercise RX and Referring Provider   Date 07/20/20    Referring Provider Jenkins Rouge, MD    Expected Discharge Date 09/18/19      Recumbant Bike   Level 2    Minutes 15    METs 2.3      Track   Laps 12    Minutes 15    METs 2.39      Prescription Details   Frequency (times per week) 3    Duration Progress to 30 minutes of continuous aerobic without signs/symptoms of physical distress      Intensity   THRR 40-80% of Max Heartrate 62-124    Ratings of Perceived Exertion 11-13    Perceived Dyspnea 0-4      Progression   Progression Continue progressive overload as per policy without signs/symptoms or physical distress.      Resistance Training   Training Prescription Yes    Weight 2 lbs    Reps 10-15            Perform Capillary Blood Glucose checks as needed.  Exercise Prescription Changes:   Exercise Prescription Changes    Row Name 07/26/20 1400             Response to Exercise   Blood Pressure (Admit) 138/80       Blood Pressure (Exercise) 124/76       Blood Pressure (Exit) 102/64       Rating of Perceived Exertion (Exercise) 12       Symptoms None       Comments Pt's first day of exercise in the CRP2 program       Duration Progress to 30 minutes of  aerobic without signs/symptoms of physical distress       Intensity THRR unchanged               Progression   Progression Continue to progress workloads to maintain intensity without signs/symptoms of physical distress.       Average METs 2.8               Resistance Training   Training Prescription Yes       Weight 2 lbs       Reps 10-15       Time 10 Minutes               Interval Training  Interval Training No               Recumbant Bike   Level 2       Minutes 15       METs 2.7               Track   Laps 16       Minutes 15       METs 2.86              Exercise Comments:   Exercise Comments    Row Name 07/26/20 1454           Exercise Comments Pt's first day in the CRP2 program. Pt tolerated session well. Pt does have limited ROM in the left shoulder which limits the use of free weights and some upperbody stretching.              Exercise Goals and Review:   Exercise Goals    Row Name 07/20/20 1216             Exercise Goals   Increase Physical Activity Yes       Intervention Provide advice, education, support and counseling about physical activity/exercise needs.;Develop an individualized exercise prescription for aerobic and resistive training based on initial evaluation findings, risk stratification, comorbidities and participant's personal goals.       Expected Outcomes Short Term: Attend rehab on a regular basis to increase amount of physical activity.;Long Term: Add  in home exercise to make exercise part of routine and to increase amount of physical activity.;Long Term: Exercising regularly at least 3-5 days a week.       Increase Strength and Stamina Yes       Intervention Provide advice, education, support and counseling about physical activity/exercise needs.;Develop an individualized exercise prescription for aerobic and resistive training based on initial evaluation findings, risk stratification, comorbidities and participant's personal goals.       Expected Outcomes Short Term: Increase workloads from initial exercise prescription for resistance, speed, and METs.;Short Term: Perform resistance training exercises routinely during rehab and add in resistance training at home;Long Term: Improve cardiorespiratory fitness, muscular endurance and strength as measured by increased METs and functional capacity (6MWT)       Able to understand and use rate of perceived exertion (RPE) scale Yes       Intervention Provide education and explanation on how to use RPE scale       Expected Outcomes Short Term: Able to use RPE daily in rehab to express subjective intensity level;Long Term:  Able to use RPE to guide intensity level when exercising independently       Knowledge and understanding of Target Heart Rate Range (THRR) Yes       Intervention Provide education and explanation of THRR including how the numbers were predicted and where they are located for reference       Expected Outcomes Short Term: Able to state/look up THRR;Short Term: Able to use daily as guideline for intensity in rehab;Long Term: Able to use THRR to govern intensity when exercising independently       Understanding of Exercise Prescription Yes       Intervention Provide education, explanation, and written materials on patient's individual exercise prescription       Expected Outcomes Short Term: Able to explain program exercise prescription;Long Term: Able to explain home exercise prescription to  exercise independently              Exercise  Goals Re-Evaluation :  Exercise Goals Re-Evaluation    [place] Name 07/26/20 1453             Exercise Goal Re-Evaluation   Exercise Goals Review Increase Physical Activity;Increase Strength and Stamina;Able to understand and use rate of perceived exertion (RPE) scale;Knowledge and understanding of Target Heart Rate Range (THRR);Understanding of Exercise Prescription       Comments Pt's first day in the CRP2 program. Pt understands the exercise Rx, THRR, and RPE sclae.       Expected Outcomes Will continue to monitor patient and progress as tolerated.               Discharge Exercise Prescription (Final Exercise Prescription Changes):  Exercise Prescription Changes - 07/26/20 1400      Response to Exercise   Blood Pressure (Admit) 138/80    Blood Pressure (Exercise) 124/76    Blood Pressure (Exit) 102/64    Rating of Perceived Exertion (Exercise) 12    Symptoms None    Comments Pt's first day of exercise in the CRP2 program    Duration Progress to 30 minutes of  aerobic without signs/symptoms of physical distress    Intensity THRR unchanged      Progression   Progression Continue to progress workloads to maintain intensity without signs/symptoms of physical distress.    Average METs 2.8      Resistance Training   Training Prescription Yes    Weight 2 lbs    Reps 10-15    Time 10 Minutes      Interval Training   Interval Training No      Recumbant Bike   Level 2    Minutes 15    METs 2.7      Track   Laps 16    Minutes 15    METs 2.86           Nutrition:  Target Goals: Understanding of nutrition guidelines, daily intake of sodium 1500mg , cholesterol 200mg , calories 30% from fat and 7% or less from saturated fats, daily to have 5 or more servings of fruits and vegetables.  Biometrics:  Pre Biometrics - 07/20/20 1000      Pre Biometrics   Waist Circumference 38 inches    Hip Circumference 40.5 inches     Waist to Hip Ratio 0.94 %    Triceps Skinfold 12 mm    % Body Fat 23.3 %    Grip Strength 40 kg    Flexibility --   Not done due to hernia, wearing truss   Single Leg Stand 5.5 seconds            Nutrition Therapy Plan and Nutrition Goals:   Nutrition Assessments:  MEDIFICTS Score Key:  ?70 Need to make dietary changes   40-70 Heart Healthy Diet  ? 40 Therapeutic Level Cholesterol Diet   Picture Your Plate Scores:  [id number] Unhealthy dietary pattern with much room for improvement.  41-50 Dietary pattern unlikely to meet recommendations for good health and room for improvement.  51-60 More healthful dietary pattern, with some room for improvement.   >60 Healthy dietary pattern, although there may be some specific behaviors that could be improved.    Nutrition Goals Re-Evaluation:   Nutrition Goals Discharge (Final Nutrition Goals Re-Evaluation):   Psychosocial: Target Goals: Acknowledge presence or absence of significant depression and/or stress, maximize coping skills, provide positive support system. Participant is able to verbalize types and ability to use techniques and skills needed for reducing stress  and depression.  Initial Review & Psychosocial Screening:  Initial Psych Review & Screening - 07/20/20 1439      Initial Review   Current issues with History of Depression;Current Stress Concerns;Current Anxiety/Panic    Source of Stress Concerns Chronic Illness;Unable to participate in former interests or hobbies;Unable to perform yard/household activities    Comments [name] has Renal cell carcinoma and is getting treatment at the [facility] in [place]? Yes   Kenyata has his wife for support     Barriers   Psychosocial barriers to participate in program The patient should benefit from training in stress management and relaxation.      Screening Interventions   Interventions Encouraged to exercise;To provide support and  resources with identified psychosocial needs;Provide feedback about the scores to participant    Expected Outcomes Long Term Goal: Stressors or current issues are controlled or eliminated.;Long Term goal: The participant improves quality of Life and PHQ9 Scores as seen by post scores and/or verbalization of changes;Short Term goal: Identification and review with participant of any Quality of Life or Depression concerns found by scoring the questionnaire.           Quality of Life Scores:  Quality of Life - 07/20/20 1207      Quality of Life   Select Quality of Life      Quality of Life Scores   Health/Function Pre 20.03 %    Socioeconomic Pre 22.57 %    Psych/Spiritual Pre 20.14 %    Family Pre 24.5 %    GLOBAL Pre 21.03 %          Scores of 19 and below usually indicate a poorer quality of life in these areas.  A difference of  2-3 points is a clinically meaningful difference.  A difference of 2-3 points in the total score of the Quality of Life Index has been associated with significant improvement in overall quality of life, self-image, physical symptoms, and general health in studies assessing change in quality of life.  PHQ-9: Recent Review Flowsheet Data    Depression screen [facility] 2/9 07/20/2020 06/03/2016 03/06/2016 01/17/2016 12/20/2015   Decreased Interest 0 0 0 0 0   Down, Depressed, Hopeless 0 0 0 0 0   PHQ - 2 Score 0 0 0 0 0     Interpretation of Total Score  Total Score Depression Severity:  1-4 = Minimal depression, 5-9 = Mild depression, 10-14 = Moderate depression, 15-19 = Moderately severe depression, 20-27 = Severe depression   Psychosocial Evaluation and Intervention:   Psychosocial Re-Evaluation:   Psychosocial Discharge (Final Psychosocial Re-Evaluation):   Vocational Rehabilitation: Provide vocational rehab assistance to qualifying candidates.   Vocational Rehab Evaluation & Intervention:   Education: Education Goals: Education classes will be  provided on a weekly basis, covering required topics. Participant will state understanding/return demonstration of topics presented.  Learning Barriers/Preferences:  Learning Barriers/Preferences - 07/20/20 1207      Learning Barriers/Preferences   Learning Barriers Sight   wears glasses   Learning Preferences None           Education Topics: Hypertension, Hypertension Reduction -Define heart disease and high blood pressure. Discus how high blood pressure affects the body and ways to reduce high blood pressure.   Exercise and Your Heart -Discuss why it is important to exercise, the FITT principles of exercise, normal and abnormal responses to exercise, and how to exercise safely.   Angina -  Discuss definition of angina, causes of angina, treatment of angina, and how to decrease risk of having angina.   Cardiac Medications -Review what the following cardiac medications are used for, how they affect the body, and side effects that may occur when taking the medications.  Medications include Aspirin, Beta blockers, calcium channel blockers, ACE Inhibitors, angiotensin receptor blockers, diuretics, digoxin, and antihyperlipidemics.   Congestive Heart Failure -Discuss the definition of CHF, how to live with CHF, the signs and symptoms of CHF, and how keep track of weight and sodium intake.   Heart Disease and Intimacy -Discus the effect sexual activity has on the heart, how changes occur during intimacy as we age, and safety during sexual activity.   Smoking Cessation / COPD -Discuss different methods to quit smoking, the health benefits of quitting smoking, and the definition of COPD.   Nutrition I: Fats -Discuss the types of cholesterol, what cholesterol does to the heart, and how cholesterol levels can be controlled.   Nutrition II: Labels -Discuss the different components of food labels and how to read food label   Heart Parts/Heart Disease and PAD -Discuss the anatomy  of the heart, the pathway of blood circulation through the heart, and these are affected by heart disease.   Stress I: Signs and Symptoms -Discuss the causes of stress, how stress may lead to anxiety and depression, and ways to limit stress.   Stress II: Relaxation -Discuss different types of relaxation techniques to limit stress.   Warning Signs of Stroke / TIA -Discuss definition of a stroke, what the signs and symptoms are of a stroke, and how to identify when someone is having stroke.   Knowledge Questionnaire Score:  Knowledge Questionnaire Score - 07/20/20 1209      Knowledge Questionnaire Score   Pre Score 21/24           Core Components/Risk Factors/Patient Goals at Admission:  Personal Goals and Risk Factors at Admission - 07/20/20 1445      Core Components/Risk Factors/Patient Goals on Admission    Weight Management Weight Maintenance;Yes    Intervention Weight Management: Provide education and appropriate resources to help participant work on and attain dietary goals.    Expected Outcomes Weight Maintenance: Understanding of the daily nutrition guidelines, which includes 25-35% calories from fat, 7% or less cal from saturated fats, less than 200mg  cholesterol, less than 1.5gm of sodium, & 5 or more servings of fruits and vegetables daily;Understanding of distribution of calorie intake throughout the day with the consumption of 4-5 meals/snacks;Understanding recommendations for meals to include 15-35% energy as protein, 25-35% energy from fat, 35-60% energy from carbohydrates, less than 200mg  of dietary cholesterol, 20-35 gm of total fiber daily    Hypertension Yes    Intervention Provide education on lifestyle modifcations including regular physical activity/exercise, weight management, moderate sodium restriction and increased consumption of fresh fruit, vegetables, and low fat dairy, alcohol moderation, and smoking cessation.;Monitor prescription use compliance.     Expected Outcomes Short Term: Continued assessment and intervention until BP is < 140/77mm HG in hypertensive participants. < 130/10mm HG in hypertensive participants with diabetes, heart failure or chronic kidney disease.;Long Term: Maintenance of blood pressure at goal levels.    Lipids Yes    Intervention Provide education and support for participant on nutrition & aerobic/resistive exercise along with prescribed medications to achieve LDL 70mg , HDL >40mg .    Expected Outcomes Short Term: Participant states understanding of desired cholesterol values and is compliant with medications prescribed. Participant is  following exercise prescription and nutrition guidelines.;Long Term: Cholesterol controlled with medications as prescribed, with individualized exercise RX and with personalized nutrition plan. Value goals: LDL < 70mg , HDL > 40 mg.    Stress Yes    Intervention Refer participants experiencing significant psychosocial distress to appropriate mental health specialists for further evaluation and treatment. When possible, include family members and significant others in education/counseling sessions.    Expected Outcomes Short Term: Participant demonstrates changes in health-related behavior, relaxation and other stress management skills, ability to obtain effective social support, and compliance with psychotropic medications if prescribed.;Long Term: Emotional wellbeing is indicated by absence of clinically significant psychosocial distress or social isolation.           Core Components/Risk Factors/Patient Goals Review:   Goals and Risk Factor Review    Row Name 07/27/20 1334             Core Components/Risk Factors/Patient Goals Review   Personal Goals Review Weight Management/Obesity;Stress;Hypertension;Lipids       Review [name] started exercise on 07/26/20. [name]'s vital signs were stable. [name] did well with exercise       Expected Outcomes [name] will continue to participate in phase  2 cardiac rehab for exercise, nutrition and lifestyle modifications              Core Components/Risk Factors/Patient Goals at Discharge (Final Review):   Goals and Risk Factor Review - 07/27/20 1334      Core Components/Risk Factors/Patient Goals Review   Personal Goals Review Weight Management/Obesity;Stress;Hypertension;Lipids    Review [name] started exercise on 07/26/20. [name]'s vital signs were stable. [name] did well with exercise    Expected Outcomes [name] will continue to participate in phase 2 cardiac rehab for exercise, nutrition and lifestyle modifications           ITP Comments:  ITP Comments    Row Name 07/20/20 1425 07/26/20 1656         ITP Comments Dr Fransico Him MD, Medical Director 30 Day ITP Review. Juandaniel started exercise on 07/26/20 and did well with exercise             Comments:Pt started cardiac rehab today.  Pt tolerated light exercise without difficulty. VSS, telemetry-Sinus Rhythm, asymptomatic.  Medication list reconciled. Pt denies barriers to medicaiton compliance.  PSYCHOSOCIAL ASSESSMENT:  PHQ-0. Pt exhibits positive coping skills, hopeful outlook with supportive family.Abdulrahim is going through cancer treatment at the [facility] in [place].    Pt enjoys fishing.   Pt oriented to exercise equipment and routine.    Understanding verbalized.Barnet Pall, RN,BSN 07/27/2020 1:43 PM

## 2020-07-28 ENCOUNTER — Encounter (HOSPITAL_COMMUNITY)
Admission: RE | Admit: 2020-07-28 | Discharge: 2020-07-28 | Disposition: A | Discharge status: Home / Self Care | Payer: Medicare HMO | Source: Ambulatory Visit | Attending: Cardiovascular Disease | Admitting: Cardiovascular Disease

## 2020-07-28 ENCOUNTER — Other Ambulatory Visit: Payer: Self-pay

## 2020-07-28 DIAGNOSIS — I2102 ST elevation (STEMI) myocardial infarction involving left anterior descending coronary artery: Secondary | ICD-10-CM

## 2020-07-28 DIAGNOSIS — I252 Old myocardial infarction: Secondary | ICD-10-CM | POA: Diagnosis not present

## 2020-07-28 DIAGNOSIS — Z955 Presence of coronary angioplasty implant and graft: Secondary | ICD-10-CM

## 2020-07-30 ENCOUNTER — Other Ambulatory Visit: Payer: Self-pay

## 2020-07-30 ENCOUNTER — Encounter (HOSPITAL_COMMUNITY)
Admission: RE | Admit: 2020-07-30 | Discharge: 2020-07-30 | Disposition: A | Discharge status: Home / Self Care | Payer: Medicare HMO | Source: Ambulatory Visit | Attending: Cardiovascular Disease | Admitting: Cardiovascular Disease

## 2020-07-30 DIAGNOSIS — I2102 ST elevation (STEMI) myocardial infarction involving left anterior descending coronary artery: Secondary | ICD-10-CM

## 2020-07-30 DIAGNOSIS — I252 Old myocardial infarction: Secondary | ICD-10-CM | POA: Diagnosis not present

## 2020-07-30 DIAGNOSIS — Z955 Presence of coronary angioplasty implant and graft: Secondary | ICD-10-CM | POA: Diagnosis not present

## 2020-07-30 NOTE — Progress Notes (Signed)
QUALITY OF LIFE SCORE REVIEW Douglas [name] completed Quality of Life survey as a participant in Cardiac Rehab. Scores 21.0 or below are considered low. Pt score very low in several areas Overall 21.03, Health and Function 20.03, socioeconomic 22.57, physiological and spiritual 20.14, family 24.00. Patient quality of life slightly altered by physical constraints which limits ability to perform as prior to recent cardiac illness. Douglas [name] is dissatisfied with his health due to his terminal cancer diagnosis and recent MI diagnosis. Douglas [name] says that his dog is at the emergency vet currently Offered emotional support and reassurance.  Will continue to monitor and intervene as necessary.Douglas [name] says that his primary care physician is aware of his diagnosis and does not want his quality of life results forwarded at this time.Barnet Pall, RN,BSN 07/30/2020 4:24 PM

## 2020-08-02 ENCOUNTER — Telehealth (HOSPITAL_COMMUNITY): Payer: Self-pay | Admitting: Family Medicine

## 2020-08-02 ENCOUNTER — Encounter (HOSPITAL_COMMUNITY): Payer: Medicare HMO

## 2020-08-04 ENCOUNTER — Encounter (HOSPITAL_COMMUNITY)
Admission: RE | Admit: 2020-08-04 | Discharge: 2020-08-04 | Disposition: A | Discharge status: Home / Self Care | Payer: Medicare HMO | Source: Ambulatory Visit | Attending: Cardiovascular Disease | Admitting: Cardiovascular Disease

## 2020-08-04 ENCOUNTER — Other Ambulatory Visit: Payer: Self-pay

## 2020-08-04 ENCOUNTER — Encounter: Payer: Self-pay | Admitting: Family Medicine

## 2020-08-04 DIAGNOSIS — Z955 Presence of coronary angioplasty implant and graft: Secondary | ICD-10-CM | POA: Diagnosis not present

## 2020-08-04 DIAGNOSIS — I252 Old myocardial infarction: Secondary | ICD-10-CM | POA: Diagnosis not present

## 2020-08-04 DIAGNOSIS — C641 Malignant neoplasm of right kidney, except renal pelvis: Secondary | ICD-10-CM

## 2020-08-04 DIAGNOSIS — I2102 ST elevation (STEMI) myocardial infarction involving left anterior descending coronary artery: Secondary | ICD-10-CM

## 2020-08-04 DIAGNOSIS — G893 Neoplasm related pain (acute) (chronic): Secondary | ICD-10-CM

## 2020-08-04 MED ORDER — HYDROCODONE-ACETAMINOPHEN 7.5-325 MG PO TABS: 2.0000 | ORAL_TABLET | ORAL | 0 refills | Status: DC

## 2020-08-05 NOTE — Progress Notes (Signed)
Reviewed home exercise Rx with patient today. Pt will walk at home 2-3 x/week for 30 minutes. Pt understands the concept of the THRR of 62-124 and the need to keep the RPE 11-13. Hydration encouraged with exercise. Weather parameters reviewed for temperature and humidity for safe exercise outdoors. Reviewed S/S that would require patient to terminate exercise and when to call 911 vs MD. Reviewed safe and proper use of NTG. Pt verbalized understanding of the home exercise Rx and was provided a copy.   Lesly Rubenstein MS, ACSM-EP-C, CCRP

## 2020-08-06 ENCOUNTER — Encounter (HOSPITAL_COMMUNITY)
Admission: RE | Admit: 2020-08-06 | Discharge: 2020-08-06 | Disposition: A | Discharge status: Home / Self Care | Payer: Medicare HMO | Source: Ambulatory Visit | Attending: Cardiovascular Disease | Admitting: Cardiovascular Disease

## 2020-08-06 ENCOUNTER — Other Ambulatory Visit: Payer: Self-pay

## 2020-08-06 DIAGNOSIS — I2102 ST elevation (STEMI) myocardial infarction involving left anterior descending coronary artery: Secondary | ICD-10-CM

## 2020-08-06 DIAGNOSIS — I252 Old myocardial infarction: Secondary | ICD-10-CM | POA: Diagnosis not present

## 2020-08-06 DIAGNOSIS — Z955 Presence of coronary angioplasty implant and graft: Secondary | ICD-10-CM

## 2020-08-09 ENCOUNTER — Other Ambulatory Visit: Payer: Self-pay

## 2020-08-09 ENCOUNTER — Encounter (HOSPITAL_COMMUNITY)
Admission: RE | Admit: 2020-08-09 | Discharge: 2020-08-09 | Disposition: A | Discharge status: Home / Self Care | Payer: Medicare HMO | Source: Ambulatory Visit | Attending: Cardiovascular Disease | Admitting: Cardiovascular Disease

## 2020-08-09 DIAGNOSIS — I2102 ST elevation (STEMI) myocardial infarction involving left anterior descending coronary artery: Secondary | ICD-10-CM

## 2020-08-09 DIAGNOSIS — Z955 Presence of coronary angioplasty implant and graft: Secondary | ICD-10-CM

## 2020-08-09 DIAGNOSIS — I252 Old myocardial infarction: Secondary | ICD-10-CM | POA: Diagnosis not present

## 2020-08-11 ENCOUNTER — Encounter (HOSPITAL_COMMUNITY)
Admission: RE | Admit: 2020-08-11 | Discharge: 2020-08-11 | Disposition: A | Discharge status: Home / Self Care | Payer: Medicare HMO | Source: Ambulatory Visit | Attending: Cardiovascular Disease | Admitting: Cardiovascular Disease

## 2020-08-11 ENCOUNTER — Other Ambulatory Visit: Payer: Self-pay

## 2020-08-11 DIAGNOSIS — I2102 ST elevation (STEMI) myocardial infarction involving left anterior descending coronary artery: Secondary | ICD-10-CM

## 2020-08-11 DIAGNOSIS — Z955 Presence of coronary angioplasty implant and graft: Secondary | ICD-10-CM | POA: Diagnosis not present

## 2020-08-11 DIAGNOSIS — I252 Old myocardial infarction: Secondary | ICD-10-CM | POA: Diagnosis not present

## 2020-08-13 ENCOUNTER — Encounter (HOSPITAL_COMMUNITY)
Admission: RE | Admit: 2020-08-13 | Discharge: 2020-08-13 | Disposition: A | Discharge status: Home / Self Care | Payer: Medicare HMO | Source: Ambulatory Visit | Attending: Cardiovascular Disease | Admitting: Cardiovascular Disease

## 2020-08-13 ENCOUNTER — Other Ambulatory Visit: Payer: Self-pay

## 2020-08-13 DIAGNOSIS — Z955 Presence of coronary angioplasty implant and graft: Secondary | ICD-10-CM | POA: Diagnosis not present

## 2020-08-13 DIAGNOSIS — I2102 ST elevation (STEMI) myocardial infarction involving left anterior descending coronary artery: Secondary | ICD-10-CM

## 2020-08-13 DIAGNOSIS — I252 Old myocardial infarction: Secondary | ICD-10-CM | POA: Diagnosis not present

## 2020-08-16 ENCOUNTER — Other Ambulatory Visit: Payer: Self-pay

## 2020-08-16 ENCOUNTER — Encounter (HOSPITAL_COMMUNITY)
Admission: RE | Admit: 2020-08-16 | Discharge: 2020-08-16 | Disposition: A | Discharge status: Home / Self Care | Payer: Medicare HMO | Source: Ambulatory Visit | Attending: Cardiovascular Disease | Admitting: Cardiovascular Disease

## 2020-08-16 DIAGNOSIS — I252 Old myocardial infarction: Secondary | ICD-10-CM | POA: Diagnosis not present

## 2020-08-16 DIAGNOSIS — Z955 Presence of coronary angioplasty implant and graft: Secondary | ICD-10-CM

## 2020-08-16 DIAGNOSIS — I2102 ST elevation (STEMI) myocardial infarction involving left anterior descending coronary artery: Secondary | ICD-10-CM

## 2020-08-17 NOTE — Progress Notes (Signed)
Cardiac Individual Treatment Plan  Patient Details  Name: Douglas [name] MRN: [id number] Date of Birth: 05-03-1956 Referring Provider:   Flowsheet Row CARDIAC REHAB PHASE II ORIENTATION from 07/20/2020 in [facility]  Referring Provider Jenkins Rouge, MD      Initial Encounter Date:  [facility] PHASE II ORIENTATION from 07/20/2020 in [place]  Date 07/20/20      Visit Diagnosis: 05/24/20 STEMI   05/24/20 S/P DES LAD  Patient's Home Medications on Admission:  Current Outpatient Medications:  .  aspirin EC 81 MG EC tablet, Take 1 tablet (81 mg total) by mouth daily. Swallow whole., Disp: 30 tablet, Rfl: 11 .  B Complex Vitamins (B COMPLEX 100 PO), Take 100 mg by mouth daily., Disp: , Rfl:  .  Bevacizumab (AVASTIN) 100 MG/4ML SOLN, Inject into the vein. (Patient not taking: Reported on 07/19/2020), Disp: , Rfl:  .  calcium carbonate (TUMS - DOSED IN MG ELEMENTAL CALCIUM) 500 MG chewable tablet, Chew 500 mg by mouth 3 (three) times daily as needed for indigestion or heartburn., Disp: , Rfl:  .  carvedilol (COREG) 3.125 MG tablet, Take 1 tablet (3.125 mg total) by mouth 2 (two) times daily with a meal., Disp: 60 tablet, Rfl: 6 .  cetirizine (ZYRTEC) 10 MG tablet, Take 5 mg by mouth daily. , Disp: , Rfl:  .  cholecalciferol (VITAMIN D3) 25 MCG (1000 UNIT) tablet, Take 2,000 Units by mouth daily., Disp: , Rfl:  .  clopidogrel (PLAVIX) 75 MG tablet, Take 1 tablet (75 mg total) by mouth daily., Disp: 30 tablet, Rfl: 11 .  denosumab (XGEVA) 120 MG/1.7ML SOLN injection, Inject 120 mg into the skin every 30 (thirty) days. , Disp: , Rfl:  .  DULoxetine (CYMBALTA) 20 MG capsule, TAKE 1 CAPSULE BY MOUTH EVERY DAY, Disp: 90 capsule, Rfl: 3 .  HM LIDOCAINE PATCH EX, Apply 5 % topically as needed., Disp: , Rfl:  .  HYDROcodone-acetaminophen (NORCO) 7.5-325 MG tablet, Take 2 tablets by mouth See admin instructions. 2 every 4  hours through out the day until taking ativan at bedtime, do not mix, Disp: 240 tablet, Rfl: 0 .  loperamide (IMODIUM A-D) 2 MG tablet, Take 2 mg by mouth 4 (four) times daily as needed for diarrhea or loose stools., Disp: , Rfl:  .  LORazepam (ATIVAN) 1 MG tablet, Take 0.5-1 tablets (0.5-1 mg total) by mouth at bedtime as needed for sleep. TAKE ONE HALF OR ONE TABLET BY MOUTH AT BEDTIME IF NEEDED FOR SLEEP, DO NOT TAKE WITH PAIN MEDS, Disp: 30 tablet, Rfl: 5 .  Multiple Vitamins-Minerals (CENTRUM SILVER 50+MEN PO), Take 1 tablet by mouth daily., Disp: , Rfl:  .  nitroGLYCERIN (NITROSTAT) 0.4 MG SL tablet, Place 1 tablet (0.4 mg total) under the tongue every 5 (five) minutes x 3 doses as needed for chest pain. Please dispense #25 x 4 bottle, Disp: 100 tablet, Rfl: 1 .  ondansetron (ZOFRAN-ODT) 4 MG disintegrating tablet, TAKE 1 TABLET BY MOUTH SUBLINGUALLY EVERY 8 HOURS AS NEEDED FOR NAUSEA OR VOMITING, Disp: 40 tablet, Rfl: 2 .  pantoprazole (PROTONIX) 20 MG tablet, Take 20 mg by mouth daily., Disp: , Rfl:  .  rosuvastatin (CRESTOR) 10 MG tablet, Take 1 tablet (10 mg total) by mouth daily., Disp: 30 tablet, Rfl: 6 .  sacubitril-valsartan (ENTRESTO) 24-26 MG, Take 1 tablet by mouth 2 (two) times daily., Disp: 60 tablet, Rfl: 6 .  traZODone (DESYREL) 50 MG  tablet, TAKE 0.5-1 TABLETS BY MOUTH AT BEDTIME AS NEEDED FOR SLEEP., Disp: 90 tablet, Rfl: 2 .  UNABLE TO FIND, Take 1 tablet by mouth daily. Med Name: Calcium 600/D-3 300 2 daily, Disp: , Rfl:  .  vitamin C (ASCORBIC ACID) 500 MG tablet, Take 500 mg by mouth daily., Disp: , Rfl:   Past Medical History: Past Medical History:  Diagnosis Date  . Allergic rhinitis   . Arthritis    "all over my body" (10/16/2013)  . Chronic back pain    "neck to lower back" (10/16/2013)  . Compression fracture    S/P L-SPINE; pt does not recall this hx on 10/16/2013  . COPD (chronic obstructive pulmonary disease) (HCC)    FVC .66  . Coronary artery disease   .  Depression    "situational since 09/22/2013 OR"  . Encounter for long-term (current) use of other medications   . GERD (gastroesophageal reflux disease)   . Hemoptysis   . 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 History of blood transfusion 09/2013   "think so; not sure; related to big OR"  . History of surgical fusion joint    DDD C-SPINE  . Hyperlipidemia   . Hypertension   . Leiomyoma OF SKIN   INCREASED RISK RENAL CELL CA  NEEDS CT OF KIDNEYS EVERY 2 YEARS NEXT DUE  02/06/13  . Melanoma in situ ([facility])   . Melanoma of back ([place])   . Neuromuscular disorder ([place])   . Osteoarthritis   . Renal calculi   . 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 Tobacco use disorder   . Unspecified vitamin D deficiency     Tobacco Use: Social History   Tobacco Use  Smoking Status Former Smoker  . Packs/day: 0.50  . Years: 40.00  . Pack years: 20.00  . Types: Cigarettes  Smokeless Tobacco Never Used  Tobacco Comment   Pt chewing Nicorette gum currently    Labs: Recent Review Flowsheet Data    Labs for ITP Cardiac and Pulmonary Rehab Latest Ref Rng & Units 04/30/2017 02/11/2018 05/01/2019 05/25/2020 06/02/2020   Cholestrol 100 - 199 mg/dL 176 164 175 121 146   LDLCALC 0 - 99 mg/dL - - - 35 57   LDLDIRECT mg/dL 64.0 60.0 63.0 - -   HDL >39 mg/dL 62.70 66.50 60.60 48 50   Trlycerides 0 - 149 mg/dL 311.0(H) 209.0(H) 241.0(H) 189(H) 244(H)   Hemoglobin A1c 4.6 - 6.5 % - - - - -      Capillary Blood Glucose: No results found for: GLUCAP   Exercise Target Goals: Exercise Program Goal: Individual exercise prescription set using results from initial 6 min walk test and THRR while considering  patient's activity barriers and safety.   Exercise Prescription Goal: Starting with aerobic activity 30 plus minutes a day, 3 days per week for initial exercise prescription. Provide home exercise prescription and guidelines that participant acknowledges understanding  prior to discharge.  Activity Barriers & Risk Stratification:  Activity Barriers & Cardiac Risk Stratification - 07/20/20 1212      Activity Barriers & Cardiac Risk Stratification   Activity Barriers Back Problems;Neck/Spine Problems;Joint Problems;Deconditioning;Muscular Weakness;Balance Concerns;Other (comment)    Comments Renal Cell Carcinoma with metastasis to bone    Cardiac Risk Stratification High           6 Minute Walk:  6 Minute Walk    Row Name 07/20/20 1210  6 Minute Walk   Phase Initial     Distance 1400 feet     Walk Time 6 minutes     # of Rest Breaks 0     MPH 2.7     METS 3.5     RPE 11     Perceived Dyspnea  0     VO2 Peak 12.16     Symptoms Yes (comment)     Comments Chronic Left shoulder pain 4/10     Resting HR 68 bpm     Resting BP 104/70     Resting Oxygen Saturation  98 %     Exercise Oxygen Saturation  during 6 min walk 97 %     Max Ex. HR 81 bpm     Max Ex. BP 110/64     2 Minute Post BP 108/70            Oxygen Initial Assessment:   Oxygen Re-Evaluation:   Oxygen Discharge (Final Oxygen Re-Evaluation):   Initial Exercise Prescription:  Initial Exercise Prescription - 07/20/20 1200      Date of Initial Exercise RX and Referring Provider   Date 07/20/20    Referring Provider Jenkins Rouge, MD    Expected Discharge Date 09/18/19      Recumbant Bike   Level 2    Minutes 15    METs 2.3      Track   Laps 12    Minutes 15    METs 2.39      Prescription Details   Frequency (times per week) 3    Duration Progress to 30 minutes of continuous aerobic without signs/symptoms of physical distress      Intensity   THRR 40-80% of Max Heartrate 62-124    Ratings of Perceived Exertion 11-13    Perceived Dyspnea 0-4      Progression   Progression Continue progressive overload as per policy without signs/symptoms or physical distress.      Resistance Training   Training Prescription Yes    Weight 2 lbs    Reps 10-15            Perform Capillary Blood Glucose checks as needed.  Exercise Prescription Changes:  Exercise Prescription Changes    Row Name 07/26/20 1400 08/04/20 1500 08/13/20 1500         Response to Exercise   Blood Pressure (Admit) 138/80 116/70 120/80     Blood Pressure (Exercise) 124/76 130/80 126/80     Blood Pressure (Exit) 102/64 118/78 104/70     Heart Rate (Admit) - 80 bpm 79 bpm     Heart Rate (Exercise) - 91 bpm 96 bpm     Heart Rate (Exit) - 60 bpm 65 bpm     Rating of Perceived Exertion (Exercise) 12 11 12      Symptoms None None None     Comments Pt's first day of exercise in the CRP2 program Reviewed Home exercise Rx Reviewed METs     Duration Progress to 30 minutes of  aerobic without signs/symptoms of physical distress Continue with 30 min of aerobic exercise without signs/symptoms of physical distress. Continue with 30 min of aerobic exercise without signs/symptoms of physical distress.     Intensity THRR unchanged THRR unchanged THRR unchanged           Progression   Progression Continue to progress workloads to maintain intensity without signs/symptoms of physical distress. Continue to progress workloads to maintain intensity without signs/symptoms of physical distress. Continue  to progress workloads to maintain intensity without signs/symptoms of physical distress.     Average METs 2.8 3.3 3.3           Resistance Training   Training Prescription Yes No Yes     Weight 2 lbs No weights on Wednesdays 2 lbs     Reps 10-15 - 10-15     Time 10 Minutes - 10 Minutes           Interval Training   Interval Training No No No           Recumbant Bike   Level 2 2.5 2.5     RPM - 60 60     Minutes 15 15 15      METs 2.7 3.8 3.7           Track   Laps 16 16 16      Minutes 15 15 15      METs 2.86 2.86 2.86           Home Exercise Plan   Plans to continue exercise at - Home (comment) Home (comment)     Frequency - Add 2 additional days to program exercise  sessions. Add 2 additional days to program exercise sessions.     Initial Home Exercises Provided - 08/04/20 08/04/20            Exercise Comments:  Exercise Comments    Row Name 07/26/20 1454 08/04/20 1500 08/13/20 1505       Exercise Comments Pt's first day in the CRP2 program. Pt tolerated session well. Pt does have limited ROM in the left shoulder which limits the use of free weights and some upperbody stretching. Reviewed home exercise Rx with patient today. Pt has been doing some walking at home and will try to incorporate walking at least 2 days a week for 30 minutes. Reviewed METs. Pt contiunes to make porgress. Pt voices feeling stronger. Pt is walking 4x/week for 20-30 minutes on off days from the [facility] program.            Exercise Goals and Review:  Exercise Goals    Row Name 07/20/20 1216             Exercise Goals   Increase Physical Activity Yes       Intervention Provide advice, education, support and counseling about physical activity/exercise needs.;Develop an individualized exercise prescription for aerobic and resistive training based on initial evaluation findings, risk stratification, comorbidities and participant's personal goals.       Expected Outcomes Short Term: Attend rehab on a regular basis to increase amount of physical activity.;Long Term: Add in home exercise to make exercise part of routine and to increase amount of physical activity.;Long Term: Exercising regularly at least 3-5 days a week.       Increase Strength and Stamina Yes       Intervention Provide advice, education, support and counseling about physical activity/exercise needs.;Develop an individualized exercise prescription for aerobic and resistive training based on initial evaluation findings, risk stratification, comorbidities and participant's personal goals.       Expected Outcomes Short Term: Increase workloads from initial exercise prescription for resistance, speed, and METs.;Short  Term: Perform resistance training exercises routinely during rehab and add in resistance training at home;Long Term: Improve cardiorespiratory fitness, muscular endurance and strength as measured by increased METs and functional capacity (6MWT)       Able to understand and use rate of perceived exertion (RPE) scale Yes  Intervention Provide education and explanation on how to use RPE scale       Expected Outcomes Short Term: Able to use RPE daily in rehab to express subjective intensity level;Long Term:  Able to use RPE to guide intensity level when exercising independently       Knowledge and understanding of Target Heart Rate Range (THRR) Yes       Intervention Provide education and explanation of THRR including how the numbers were predicted and where they are located for reference       Expected Outcomes Short Term: Able to state/look up THRR;Short Term: Able to use daily as guideline for intensity in rehab;Long Term: Able to use THRR to govern intensity when exercising independently       Understanding of Exercise Prescription Yes       Intervention Provide education, explanation, and written materials on patient's individual exercise prescription       Expected Outcomes Short Term: Able to explain program exercise prescription;Long Term: Able to explain home exercise prescription to exercise independently              Exercise Goals Re-Evaluation :  Exercise Goals Re-Evaluation    Row Name 07/26/20 1453 08/04/20 1500           Exercise Goal Re-Evaluation   Exercise Goals Review Increase Physical Activity;Increase Strength and Stamina;Able to understand and use rate of perceived exertion (RPE) scale;Knowledge and understanding of Target Heart Rate Range (THRR);Understanding of Exercise Prescription Increase Physical Activity;Increase Strength and Stamina;Able to check pulse independently;Understanding of Exercise Prescription;Able to understand and use rate of perceived exertion  (RPE) scale;Knowledge and understanding of Target Heart Rate Range (THRR)      Comments Pt's first day in the CRP2 program. Pt understands the exercise Rx, THRR, and RPE sclae. Reviewed home exercise Rx with patient today. Pt will walk 2-3x/week for 30 minutes. Pt verbalized understanding of the Home exercise Rx and was provided a copy.      Expected Outcomes Will continue to monitor patient and progress as tolerated. Pt will walk at home 2-3x/week for 30 minutes to increase CV fitness level.              Discharge Exercise Prescription (Final Exercise Prescription Changes):  Exercise Prescription Changes - 08/13/20 1500      Response to Exercise   Blood Pressure (Admit) 120/80    Blood Pressure (Exercise) 126/80    Blood Pressure (Exit) 104/70    Heart Rate (Admit) 79 bpm    Heart Rate (Exercise) 96 bpm    Heart Rate (Exit) 65 bpm    Rating of Perceived Exertion (Exercise) 12    Symptoms None    Comments Reviewed METs    Duration Continue with 30 min of aerobic exercise without signs/symptoms of physical distress.    Intensity THRR unchanged      Progression   Progression Continue to progress workloads to maintain intensity without signs/symptoms of physical distress.    Average METs 3.3      Resistance Training   Training Prescription Yes    Weight 2 lbs    Reps 10-15    Time 10 Minutes      Interval Training   Interval Training No      Recumbant Bike   Level 2.5    RPM 60    Minutes 15    METs 3.7      Track   Laps 16    Minutes 15    METs  2.86      Home Exercise Plan   Plans to continue exercise at Home (comment)    Frequency Add 2 additional days to program exercise sessions.    Initial Home Exercises Provided 08/04/20           Nutrition:  Target Goals: Understanding of nutrition guidelines, daily intake of sodium 1500mg , cholesterol 200mg , calories 30% from fat and 7% or less from saturated fats, daily to have 5 or more servings of fruits and  vegetables.  Biometrics:  Pre Biometrics - 07/20/20 1000      Pre Biometrics   Waist Circumference 38 inches    Hip Circumference 40.5 inches    Waist to Hip Ratio 0.94 %    Triceps Skinfold 12 mm    % Body Fat 23.3 %    Grip Strength 40 kg    Flexibility --   Not done due to hernia, wearing truss   Single Leg Stand 5.5 seconds            Nutrition Therapy Plan and Nutrition Goals:  Nutrition Therapy & Goals - 08/13/20 1204      Nutrition Therapy   RD appointment deferred Yes           Nutrition Assessments:  MEDIFICTS Score Key:  ?70 Need to make dietary changes   40-70 Heart Healthy Diet  ? 40 Therapeutic Level Cholesterol Diet   Picture Your Plate Scores:  <83 Unhealthy dietary pattern with much room for improvement.  41-50 Dietary pattern unlikely to meet recommendations for good health and room for improvement.  51-60 More healthful dietary pattern, with some room for improvement.   >60 Healthy dietary pattern, although there may be some specific behaviors that could be improved.    Nutrition Goals Re-Evaluation:   Nutrition Goals Discharge (Final Nutrition Goals Re-Evaluation):   Psychosocial: Target Goals: Acknowledge presence or absence of significant depression and/or stress, maximize coping skills, provide positive support system. Participant is able to verbalize types and ability to use techniques and skills needed for reducing stress and depression.  Initial Review & Psychosocial Screening:  Initial Psych Review & Screening - 07/20/20 1439      Initial Review   Current issues with History of Depression;Current Stress Concerns;Current Anxiety/Panic    Source of Stress Concerns Chronic Illness;Unable to participate in former interests or hobbies;Unable to perform yard/household activities    Comments [name] has Renal cell carcinoma and is getting treatment at the [facility] in [place]? Yes   Anant has  his wife for support     Barriers   Psychosocial barriers to participate in program The patient should benefit from training in stress management and relaxation.      Screening Interventions   Interventions Encouraged to exercise;To provide support and resources with identified psychosocial needs;Provide feedback about the scores to participant    Expected Outcomes Long Term Goal: Stressors or current issues are controlled or eliminated.;Long Term goal: The participant improves quality of Life and PHQ9 Scores as seen by post scores and/or verbalization of changes;Short Term goal: Identification and review with participant of any Quality of Life or Depression concerns found by scoring the questionnaire.           Quality of Life Scores:  Quality of Life - 07/20/20 1207      Quality of Life   Select Quality of Life      Quality of Life Scores  Health/Function Pre 20.03 %    Socioeconomic Pre 22.57 %    Psych/Spiritual Pre 20.14 %    Family Pre 24.5 %    GLOBAL Pre 21.03 %          Scores of 19 and below usually indicate a poorer quality of life in these areas.  A difference of  2-3 points is a clinically meaningful difference.  A difference of 2-3 points in the total score of the Quality of Life Index has been associated with significant improvement in overall quality of life, self-image, physical symptoms, and general health in studies assessing change in quality of life.  PHQ-9: Recent Review Flowsheet Data    Depression screen [facility] 2/9 07/20/2020 06/03/2016 03/06/2016 01/17/2016 12/20/2015   Decreased Interest 0 0 0 0 0   Down, Depressed, Hopeless 0 0 0 0 0   PHQ - 2 Score 0 0 0 0 0     Interpretation of Total Score  Total Score Depression Severity:  1-4 = Minimal depression, 5-9 = Mild depression, 10-14 = Moderate depression, 15-19 = Moderately severe depression, 20-27 = Severe depression   Psychosocial Evaluation and Intervention:   Psychosocial Re-Evaluation:   Psychosocial Re-Evaluation    [place] Name 08/17/20 1717             Psychosocial Re-Evaluation   Current issues with Current Depression;Current Anxiety/Panic;Current Stress Concerns       Comments Nicoles has not voiced any increased stressors. Lashon recently lost his dog.       Expected Outcomes patient will have decreased stress upon completion of phase 2 cardiac rehab       Continue Psychosocial Services  Follow up required by staff               Initial Review   Source of Stress Concerns Chronic Illness;Unable to perform yard/household activities              Psychosocial Discharge (Final Psychosocial Re-Evaluation):  Psychosocial Re-Evaluation - 08/17/20 1717      Psychosocial Re-Evaluation   Current issues with Current Depression;Current Anxiety/Panic;Current Stress Concerns    Comments Mussa has not voiced any increased stressors. Lamine recently lost his dog.    Expected Outcomes patient will have decreased stress upon completion of phase 2 cardiac rehab    Continue Psychosocial Services  Follow up required by staff      Initial Review   Source of Stress Concerns Chronic Illness;Unable to perform yard/household activities           Vocational Rehabilitation: Provide vocational rehab assistance to qualifying candidates.   Vocational Rehab Evaluation & Intervention:   Education: Education Goals: Education classes will be provided on a weekly basis, covering required topics. Participant will state understanding/return demonstration of topics presented.  Learning Barriers/Preferences:  Learning Barriers/Preferences - 07/20/20 1207      Learning Barriers/Preferences   Learning Barriers Sight   wears glasses   Learning Preferences None           Education Topics: Hypertension, Hypertension Reduction -Define heart disease and high blood pressure. Discus how high blood pressure affects the body and ways to reduce high blood pressure.   Exercise and Your  Heart -Discuss why it is important to exercise, the FITT principles of exercise, normal and abnormal responses to exercise, and how to exercise safely.   Angina -Discuss definition of angina, causes of angina, treatment of angina, and how to decrease risk of having angina.   Cardiac Medications -Review what the  following cardiac medications are used for, how they affect the body, and side effects that may occur when taking the medications.  Medications include Aspirin, Beta blockers, calcium channel blockers, ACE Inhibitors, angiotensin receptor blockers, diuretics, digoxin, and antihyperlipidemics.   Congestive Heart Failure -Discuss the definition of CHF, how to live with CHF, the signs and symptoms of CHF, and how keep track of weight and sodium intake.   Heart Disease and Intimacy -Discus the effect sexual activity has on the heart, how changes occur during intimacy as we age, and safety during sexual activity.   Smoking Cessation / COPD -Discuss different methods to quit smoking, the health benefits of quitting smoking, and the definition of COPD.   Nutrition I: Fats -Discuss the types of cholesterol, what cholesterol does to the heart, and how cholesterol levels can be controlled.   Nutrition II: Labels -Discuss the different components of food labels and how to read food label   Heart Parts/Heart Disease and PAD -Discuss the anatomy of the heart, the pathway of blood circulation through the heart, and these are affected by heart disease.   Stress I: Signs and Symptoms -Discuss the causes of stress, how stress may lead to anxiety and depression, and ways to limit stress.   Stress II: Relaxation -Discuss different types of relaxation techniques to limit stress.   Warning Signs of Stroke / TIA -Discuss definition of a stroke, what the signs and symptoms are of a stroke, and how to identify when someone is having stroke.   Knowledge Questionnaire Score:  Knowledge  Questionnaire Score - 07/20/20 1209      Knowledge Questionnaire Score   Pre Score 21/24           Core Components/Risk Factors/Patient Goals at Admission:  Personal Goals and Risk Factors at Admission - 07/20/20 1445      Core Components/Risk Factors/Patient Goals on Admission    Weight Management Weight Maintenance;Yes    Intervention Weight Management: Provide education and appropriate resources to help participant work on and attain dietary goals.    Expected Outcomes Weight Maintenance: Understanding of the daily nutrition guidelines, which includes 25-35% calories from fat, 7% or less cal from saturated fats, less than 200mg  cholesterol, less than 1.5gm of sodium, & 5 or more servings of fruits and vegetables daily;Understanding of distribution of calorie intake throughout the day with the consumption of 4-5 meals/snacks;Understanding recommendations for meals to include 15-35% energy as protein, 25-35% energy from fat, 35-60% energy from carbohydrates, less than 200mg  of dietary cholesterol, 20-35 gm of total fiber daily    Hypertension Yes    Intervention Provide education on lifestyle modifcations including regular physical activity/exercise, weight management, moderate sodium restriction and increased consumption of fresh fruit, vegetables, and low fat dairy, alcohol moderation, and smoking cessation.;Monitor prescription use compliance.    Expected Outcomes Short Term: Continued assessment and intervention until BP is < 140/63mm HG in hypertensive participants. < 130/58mm HG in hypertensive participants with diabetes, heart failure or chronic kidney disease.;Long Term: Maintenance of blood pressure at goal levels.    Lipids Yes    Intervention Provide education and support for participant on nutrition & aerobic/resistive exercise along with prescribed medications to achieve LDL 70mg , HDL >40mg .    Expected Outcomes Short Term: Participant states understanding of desired cholesterol  values and is compliant with medications prescribed. Participant is following exercise prescription and nutrition guidelines.;Long Term: Cholesterol controlled with medications as prescribed, with individualized exercise RX and with personalized nutrition plan. Value goals: LDL <  70mg , HDL > 40 mg.    Stress Yes    Intervention Refer participants experiencing significant psychosocial distress to appropriate mental health specialists for further evaluation and treatment. When possible, include family members and significant others in education/counseling sessions.    Expected Outcomes Short Term: Participant demonstrates changes in health-related behavior, relaxation and other stress management skills, ability to obtain effective social support, and compliance with psychotropic medications if prescribed.;Long Term: Emotional wellbeing is indicated by absence of clinically significant psychosocial distress or social isolation.           Core Components/Risk Factors/Patient Goals Review:   Goals and Risk Factor Review    Row Name 07/27/20 1334 08/17/20 1719           Core Components/Risk Factors/Patient Goals Review   Personal Goals Review Weight Management/Obesity;Stress;Hypertension;Lipids Weight Management/Obesity;Stress;Hypertension;Lipids      Review [name] started exercise on 07/26/20. [name]'s vital signs were stable. [name] did well with exercise [name] has been doing well with exercise. Makhari's vital signs have been stable      Expected Outcomes [name] will continue to participate in phase 2 cardiac rehab for exercise, nutrition and lifestyle modifications [name] will continue to participate in phase 2 cardiac rehab for exercise, nutrition and lifestyle modifications             Core Components/Risk Factors/Patient Goals at Discharge (Final Review):   Goals and Risk Factor Review - 08/17/20 1719      Core Components/Risk Factors/Patient Goals Review   Personal Goals Review Weight  Management/Obesity;Stress;Hypertension;Lipids    Review [name] has been doing well with exercise. Sathvik's vital signs have been stable    Expected Outcomes [name] will continue to participate in phase 2 cardiac rehab for exercise, nutrition and lifestyle modifications           ITP Comments:  ITP Comments    Row Name 07/20/20 1425 07/26/20 1656 08/17/20 1716       ITP Comments Dr Fransico Him MD, Medical Director 30 Day ITP Review. Oluwademilade started exercise on 07/26/20 and did well with exercise 30 Day ITP Review. Jaqualyn has good attendance and partcipation in phase 2 cardiac rehab            Comments: See ITP comments.Barnet Pall, RN,BSN 08/17/2020 5:21 PM

## 2020-08-18 ENCOUNTER — Other Ambulatory Visit: Payer: Self-pay

## 2020-08-18 ENCOUNTER — Encounter (HOSPITAL_COMMUNITY)
Admission: RE | Admit: 2020-08-18 | Discharge: 2020-08-18 | Disposition: A | Discharge status: Home / Self Care | Payer: Medicare HMO | Source: Ambulatory Visit | Attending: Cardiovascular Disease | Admitting: Cardiovascular Disease

## 2020-08-18 VITALS — Wt 163.0 lb

## 2020-08-18 DIAGNOSIS — Z955 Presence of coronary angioplasty implant and graft: Secondary | ICD-10-CM | POA: Diagnosis not present

## 2020-08-18 DIAGNOSIS — I213 ST elevation (STEMI) myocardial infarction of unspecified site: Secondary | ICD-10-CM | POA: Insufficient documentation

## 2020-08-18 DIAGNOSIS — I2102 ST elevation (STEMI) myocardial infarction involving left anterior descending coronary artery: Secondary | ICD-10-CM

## 2020-08-18 NOTE — Progress Notes (Signed)
Douglas [name] 65 y.o. 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   Current Outpatient Medications:    aspirin EC 81 MG EC tablet, Take 1 tablet (81 mg total) by mouth daily. Swallow whole., Disp: 30 tablet, Rfl: 11   B Complex Vitamins (B COMPLEX 100 PO), Take 100 mg by mouth daily., Disp: , Rfl:    Bevacizumab (AVASTIN) 100 MG/4ML SOLN, Inject into the vein. (Patient not taking: Reported on 07/19/2020), Disp: , Rfl:    calcium carbonate (TUMS - DOSED IN MG ELEMENTAL CALCIUM) 500 MG chewable tablet, Chew 500 mg by mouth 3 (three) times daily as needed for indigestion or heartburn., Disp: , Rfl:    carvedilol (COREG) 3.125 MG tablet, Take 1 tablet (3.125 mg total) by mouth 2 (two) times daily with a meal., Disp: 60 tablet, Rfl: 6   cetirizine (ZYRTEC) 10 MG  tablet, Take 5 mg by mouth daily. , Disp: , Rfl:    cholecalciferol (VITAMIN D3) 25 MCG (1000 UNIT) tablet, Take 2,000 Units by mouth daily., Disp: , Rfl:    clopidogrel (PLAVIX) 75 MG tablet, Take 1 tablet (75 mg total) by mouth daily., Disp: 30 tablet, Rfl: 11   denosumab (XGEVA) 120 MG/1.7ML SOLN injection, Inject 120 mg into the skin every 30 (thirty) days. , Disp: , Rfl:    DULoxetine (CYMBALTA) 20 MG capsule, TAKE 1 CAPSULE BY MOUTH EVERY DAY, Disp: 90 capsule, Rfl: 3   HM LIDOCAINE PATCH EX, Apply 5 % topically as needed., Disp: , Rfl:    HYDROcodone-acetaminophen (NORCO) 7.5-325 MG tablet, Take 2 tablets by mouth See admin instructions. 2 every 4 hours through out the day until taking ativan at bedtime, do not mix, Disp: 240 tablet, Rfl: 0   loperamide (IMODIUM A-D) 2 MG tablet, Take 2 mg by mouth 4 (four) times daily as needed for diarrhea or loose stools., Disp: , Rfl:    LORazepam (ATIVAN) 1 MG tablet, Take 0.5-1 tablets (0.5-1 mg total) by mouth at bedtime as needed for sleep. TAKE ONE HALF OR ONE TABLET BY MOUTH AT BEDTIME IF NEEDED FOR SLEEP, DO NOT TAKE  WITH PAIN MEDS, Disp: 30 tablet, Rfl: 5   Multiple Vitamins-Minerals (CENTRUM SILVER 50+MEN PO), Take 1 tablet by mouth daily., Disp: , Rfl:    nitroGLYCERIN (NITROSTAT) 0.4 MG SL tablet, Place 1 tablet (0.4 mg total) under the tongue every 5 (five) minutes x 3 doses as needed for chest pain. Please dispense #25 x 4 bottle, Disp: 100 tablet, Rfl: 1   ondansetron (ZOFRAN-ODT) 4 MG disintegrating tablet, TAKE 1 TABLET BY MOUTH SUBLINGUALLY EVERY 8 HOURS AS NEEDED FOR NAUSEA OR VOMITING, Disp: 40 tablet, Rfl: 2   pantoprazole (PROTONIX) 20 MG tablet, Take 20 mg by mouth daily., Disp: , Rfl:    rosuvastatin (CRESTOR) 10 MG tablet, Take 1 tablet (10 mg total) by mouth daily., Disp: 30 tablet, Rfl: 6   sacubitril-valsartan (ENTRESTO) 24-26 MG, Take 1 tablet by mouth 2 (two) times daily., Disp: 60 tablet, Rfl: 6   traZODone  (DESYREL) 50 MG tablet, TAKE 0.5-1 TABLETS BY MOUTH AT BEDTIME AS NEEDED FOR SLEEP., Disp: 90 tablet, Rfl: 2   UNABLE TO FIND, Take 1 tablet by mouth daily. Med Name: Calcium 600/D-3 300 2 daily, Disp: , Rfl:    vitamin C (ASCORBIC ACID) 500 MG tablet, Take 500 mg by mouth daily., Disp: , Rfl:    Ht Readings from Last 1 Encounters:  07/20/20 6' (1.829 m)     Wt Readings from Last 3 Encounters:  07/20/20 163 lb 5.8 oz (74.1 kg)  07/02/20 164 lb 9.6 oz (74.7 kg)  06/02/20 163 lb (73.9 kg)     There is no height or weight on file to calculate BMI.   Social History   Tobacco Use  Smoking Status Former Smoker   Packs/day: 0.50   Years: 40.00   Pack years: 20.00   Types: Cigarettes  Smokeless Tobacco Never Used  Tobacco Comment   Pt chewing Nicorette gum currently     Lab Results  Component Value Date   CHOL 146 06/02/2020   Lab Results  Component Value Date   HDL 50 06/02/2020   Lab Results  Component Value Date   LDLCALC 57 06/02/2020   Lab Results  Component Value Date   TRIG 244 (H) 06/02/2020     Lab Results  Component Value Date   HGBA1C 5.2 05/29/2016     CBG (last 3)  No results for input(s): GLUCAP in the last 72 hours.   Nutrition Note  Spoke with pt. Nutrition Plan and Nutrition Survey goals reviewed with pt.   Pt with metastatic renal cancer. He has been working on gaining weight for the past few years. He has met with dietitians.  He is trying to make heart healthy choices and increase calories for weight gain. GFR 56 mg/dl   Diet recall:  Breakfast: frosted mini wheats, 2% milk Lunch: orange, ham and munster cheese on whole wheat bread, rice chips Dinner: 6 oz pork tenderloin, baked potato, and green beans Snacks: blueberry dannon yogurt, mixed nuts, dried fruit. 1 c Jenni's ice cream. Estimated protein intake 100 g.  No salt shaker use.   Pt expressed understanding of the information reviewed.   Nutrition Diagnosis Inadequate  oral intake related to cancer treatment and poor appetite as evidenced by pt report of 40 lb weight loss  Nutrition Intervention ? Pts individual nutrition plan reviewed with pt. ? Benefits of adopting Heart Healthy diet discussed when Picture Your Plate reviewed.   ? Continue client-centered nutrition education by RD, as part of interdisciplinary care.  Goal(s) ?  Pt to gain 0.5 lbs per week during cardiac rehab ? Pt to incorporate more healthy fats into diet   Plan:   Will provide client-centered nutrition education as part of interdisciplinary care  Monitor and evaluate progress toward nutrition goal with team.   Michaele Offer, MS, RDN, LDN

## 2020-08-20 ENCOUNTER — Encounter (HOSPITAL_COMMUNITY)
Admission: RE | Admit: 2020-08-20 | Discharge: 2020-08-20 | Disposition: A | Discharge status: Home / Self Care | Payer: Medicare HMO | Source: Ambulatory Visit | Attending: Cardiovascular Disease | Admitting: Cardiovascular Disease

## 2020-08-20 ENCOUNTER — Other Ambulatory Visit: Payer: Self-pay

## 2020-08-20 DIAGNOSIS — Z955 Presence of coronary angioplasty implant and graft: Secondary | ICD-10-CM | POA: Diagnosis not present

## 2020-08-20 DIAGNOSIS — I213 ST elevation (STEMI) myocardial infarction of unspecified site: Secondary | ICD-10-CM | POA: Diagnosis not present

## 2020-08-20 DIAGNOSIS — I2102 ST elevation (STEMI) myocardial infarction involving left anterior descending coronary artery: Secondary | ICD-10-CM

## 2020-08-23 ENCOUNTER — Encounter (HOSPITAL_COMMUNITY): Payer: Medicare HMO

## 2020-08-23 LAB — BASIC METABOLIC PANEL
BUN: 26 — AB (ref 4–21)
CO2: 19 (ref 13–22)
Chloride: 108 (ref 99–108)
Creatinine: 1.2 (ref 0.6–1.3)
Glucose: 82
Potassium: 4.3 (ref 3.4–5.3)
Sodium: 135 — AB (ref 137–147)

## 2020-08-23 LAB — COMPREHENSIVE METABOLIC PANEL
Albumin: 3.8 (ref 3.5–5.0)
Calcium: 9.1 (ref 8.7–10.7)
GFR calc non Af Amer: 48

## 2020-08-23 LAB — CBC AND DIFFERENTIAL
HCT: 39 — AB (ref 41–53)
Hemoglobin: 13.1 — AB (ref 13.5–17.5)
Platelets: 201 (ref 150–399)
WBC: 8.3

## 2020-08-23 LAB — HEPATIC FUNCTION PANEL
ALT: 33 (ref 10–40)
AST: 31 (ref 14–40)
Alkaline Phosphatase: 57 (ref 25–125)
Bilirubin, Direct: 0.1 (ref 0.01–0.4)
Bilirubin, Total: 0.3

## 2020-08-25 ENCOUNTER — Encounter (HOSPITAL_COMMUNITY): Payer: Medicare HMO

## 2020-08-25 ENCOUNTER — Telehealth: Payer: Self-pay | Admitting: Cardiovascular Disease

## 2020-08-25 DIAGNOSIS — I513 Intracardiac thrombosis, not elsewhere classified: Secondary | ICD-10-CM

## 2020-08-25 NOTE — Telephone Encounter (Signed)
Tired to call Lattie Haw PA back, received this recording "Call cannot be completed at this time". Will try again later.

## 2020-08-25 NOTE — Telephone Encounter (Signed)
    Dr. Franki Monte returning call

## 2020-08-25 NOTE — Telephone Encounter (Signed)
Dr. Burt Knack personally spoke with Dr. Franki Monte. The patient had an echo today at [place] and showed apical thrombus. He is coming home to [facility] tomorrow. Per Dr. Burt Knack, the patient is to STOP ASPIRIN and START ELIQUIS 5 mg BID. He will have CBC and BMET and cardiac MRI for LV thrombus.

## 2020-08-25 NOTE — Telephone Encounter (Signed)
    Lattie Haw PA, she said Dr. Franki Monte need to speak with [name] or DOD about pt's recent echo. She would like to get a callback today. Also since they seeing patients if they unable to answer to leave them a message and best callback number.

## 2020-08-26 ENCOUNTER — Telehealth (HOSPITAL_COMMUNITY): Payer: Self-pay | Admitting: Emergency Medicine

## 2020-08-26 ENCOUNTER — Other Ambulatory Visit: Payer: Self-pay

## 2020-08-26 ENCOUNTER — Other Ambulatory Visit: Payer: Medicare HMO | Admitting: *Deleted

## 2020-08-26 ENCOUNTER — Telehealth: Payer: Self-pay | Admitting: Cardiovascular Disease

## 2020-08-26 DIAGNOSIS — I513 Intracardiac thrombosis, not elsewhere classified: Secondary | ICD-10-CM

## 2020-08-26 NOTE — Telephone Encounter (Signed)
Left message for patient to call and discuss scheduling the  Cardiac MRI ordered by Dr. Burt Knack

## 2020-08-26 NOTE — Telephone Encounter (Signed)
Reaching out to patient to offer assistance regarding upcoming cardiac imaging study; pt verbalizes understanding of appt date/time, parking situation and where to check in, and verified current allergies; name and call back number provided for further questions should they arise Marchia Bond RN Navigator Cardiac Imaging [facility] [phone] office [phone] cell  Pt will take ativan 66mins; denies implants Clarise Cruz

## 2020-08-26 NOTE — Telephone Encounter (Signed)
Spoke with patient regarding 08/27/20 10:00 am Cardiac MRI appointment at [facility]---arrival time is 9:45 am 1st floor admissions office for check in---patient aware study is an add on and he may have to wait.  Informed patient information is available in My Chart.  He voiced his understanding.

## 2020-08-26 NOTE — Telephone Encounter (Signed)
The patient is in Clinic today for his lab work and to get his Eliquis samples.  Reviewed instructions. He is to STOP ASPIRIN and START ELIQUIS 5 mg BID starting tomorrow. MRI is scheduled for tomorrow. He understands the imaging navigator will call him to review instructions. Scheduled him for visit with [name] 3/24. He understands he will be brought in earlier if needed based on MRI results. He was grateful for assistance.

## 2020-08-27 ENCOUNTER — Ambulatory Visit (HOSPITAL_COMMUNITY)
Admission: RE | Admit: 2020-08-27 | Discharge: 2020-08-27 | Disposition: A | Discharge status: Home / Self Care | Payer: Medicare HMO | Source: Ambulatory Visit | Attending: Cardiovascular Disease | Admitting: Cardiovascular Disease

## 2020-08-27 ENCOUNTER — Encounter (HOSPITAL_COMMUNITY): Payer: Medicare HMO

## 2020-08-27 DIAGNOSIS — I513 Intracardiac thrombosis, not elsewhere classified: Secondary | ICD-10-CM | POA: Insufficient documentation

## 2020-08-27 LAB — CBC WITH DIFFERENTIAL/PLATELET
Basophils Absolute: 0 10*3/uL (ref 0.0–0.2)
Basos: 0 %
EOS (ABSOLUTE): 0.4 10*3/uL (ref 0.0–0.4)
Eos: 5 %
Hematocrit: 36.8 % — ABNORMAL LOW (ref 37.5–51.0)
Hemoglobin: 12.4 g/dL — ABNORMAL LOW (ref 13.0–17.7)
Immature Grans (Abs): 0 10*3/uL (ref 0.0–0.1)
Immature Granulocytes: 0 %
Lymphocytes Absolute: 2.1 10*3/uL (ref 0.7–3.1)
Lymphs: 30 %
MCH: 30.8 pg (ref 26.6–33.0)
MCHC: 33.7 g/dL (ref 31.5–35.7)
MCV: 91 fL (ref 79–97)
Monocytes Absolute: 0.6 10*3/uL (ref 0.1–0.9)
Monocytes: 9 %
Neutrophils Absolute: 3.8 10*3/uL (ref 1.4–7.0)
Neutrophils: 56 %
Platelets: 205 10*3/uL (ref 150–450)
RBC: 4.03 x10E6/uL — ABNORMAL LOW (ref 4.14–5.80)
RDW: 13.6 % (ref 11.6–15.4)
WBC: 6.9 10*3/uL (ref 3.4–10.8)

## 2020-08-27 LAB — BASIC METABOLIC PANEL
BUN/Creatinine Ratio: 15 (ref 10–24)
BUN: 17 mg/dL (ref 8–27)
CO2: 20 mmol/L (ref 20–29)
Calcium: 8.2 mg/dL — ABNORMAL LOW (ref 8.6–10.2)
Chloride: 108 mmol/L — ABNORMAL HIGH (ref 96–106)
Creatinine, Ser: 1.1 mg/dL (ref 0.76–1.27)
Glucose: 67 mg/dL (ref 65–99)
Potassium: 4.3 mmol/L (ref 3.5–5.2)
Sodium: 142 mmol/L (ref 134–144)
eGFR: 74 mL/min/{1.73_m2} (ref >59)

## 2020-08-27 IMAGING — MR MR CARD MORPHOLOGY WO/W CM
45 of 48 series · 45 of 48 positions shown · IV contrast (Gadavist)
Comparison: none

CLINICAL DATA: Clinical question of left ventricular thrombus
64 year old White Male

Study assumes HCT of
EXAM:
CARDIAC MRI
TECHNIQUE: The patient was scanned on a 1.5 Tesla GE magnet. A dedicated
cardiac coil was used. Functional imaging was done using Fiesta
sequences. [DATE], and 4 chamber views were done to assess for RWMA's.
Modified RINKU rule using a short axis stack was used to
calculate an ejection fraction on a dedicated work station using
Circle software. The patient received 11 cc of Gadavist. After 10
minutes inversion recovery sequences were used to assess for
infiltration and scar tissue.
CONTRAST:  11 cc  of Gadavist

[Series 4: t2_[id number]_tra_bh · axial · 8.0mm · 1.41mm/px · 1 of 16 slices shown]
[im 1/16]
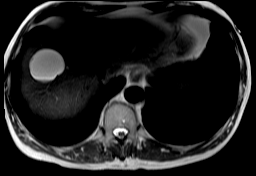

[Series 8: bSSFP · oblique · 8.0mm · 1.79mm/px · 1 of 25 slices shown (1 of 24)]
[im 1/25]
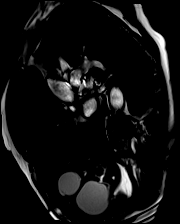

[Series 9: bSSFP · oblique · 8.0mm · 1.79mm/px · 1 of 25 slices shown (2 of 24)]
[im 1/25]
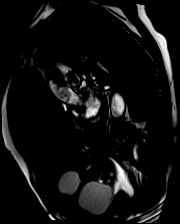

[Series 10: bSSFP · oblique · 8.0mm · 1.79mm/px · 1 of 25 slices shown (3 of 24)]
[im 1/25]
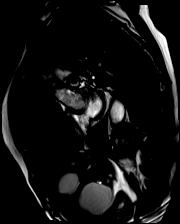

[Series 11: bSSFP · oblique · 8.0mm · 1.79mm/px · 1 of 25 slices shown (4 of 24)]
[im 1/25]
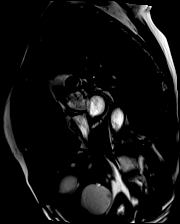

[Series 12: bSSFP · oblique · 8.0mm · 1.79mm/px · 1 of 25 slices shown (5 of 24)]
[im 1/25]
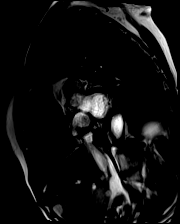

[Series 13: bSSFP · oblique · 8.0mm · 1.79mm/px · 1 of 25 slices shown (6 of 24)]
[im 1/25]
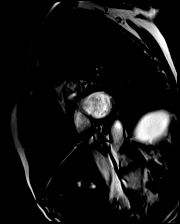

[Series 14: bSSFP · oblique · 8.0mm · 1.79mm/px · 1 of 25 slices shown (7 of 24)]
[im 1/25]
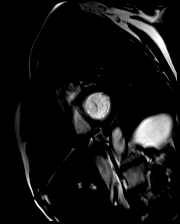

[Series 15: bSSFP · oblique · 8.0mm · 1.79mm/px · 1 of 25 slices shown (8 of 24)]
[im 1/25]
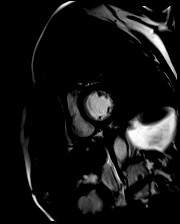

[Series 16: bSSFP · oblique · 8.0mm · 1.79mm/px · 1 of 25 slices shown (9 of 24)]
[im 1/25]
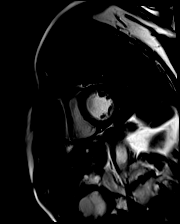

[Series 17: bSSFP · oblique · 8.0mm · 1.79mm/px · 1 of 25 slices shown (10 of 24)]
[im 1/25]
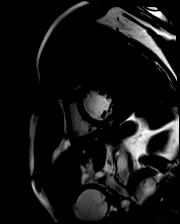

[Series 18: bSSFP · oblique · 8.0mm · 1.79mm/px · 1 of 25 slices shown (11 of 24)]
[im 1/25]
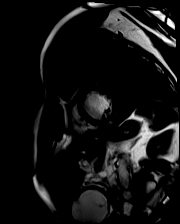

[Series 19: bSSFP · oblique · 8.0mm · 1.79mm/px · 1 of 25 slices shown (12 of 24)]
[im 1/25]
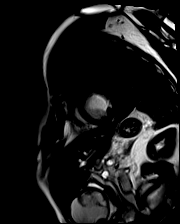

[Series 20: bSSFP · oblique · 8.0mm · 1.79mm/px · 1 of 25 slices shown (13 of 24)]
[im 1/25]
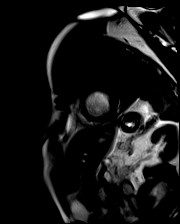

[Series 21: bSSFP · oblique · 8.0mm · 1.79mm/px · 1 of 25 slices shown (14 of 24)]
[im 1/25]
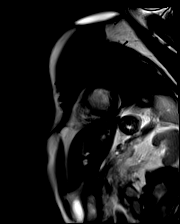

[Series 22: bSSFP · oblique · 8.0mm · 1.79mm/px · 1 of 25 slices shown (15 of 24)]
[im 1/25]
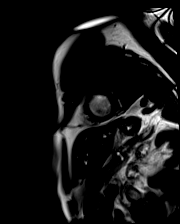

[Series 23: bSSFP · oblique · 8.0mm · 1.79mm/px · 1 of 25 slices shown (16 of 24)]
[im 1/25]
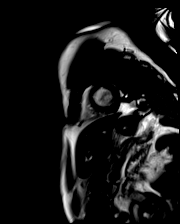

[Series 24: bSSFP · oblique · 8.0mm · 1.79mm/px · 1 of 25 slices shown (17 of 24)]
[im 1/25]
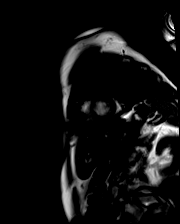

[Series 25: bSSFP · oblique · 8.0mm · 1.79mm/px · 1 of 25 slices shown (18 of 24)]
[im 1/25]
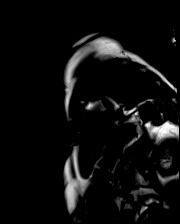

[Series 26: bSSFP · oblique · 8.0mm · 1.79mm/px · 1 of 25 slices shown (19 of 24)]
[im 1/25]
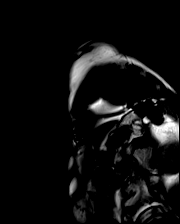

[Series 27: bSSFP · oblique · 8.0mm · 1.79mm/px · 1 of 25 slices shown (20 of 24)]
[im 1/25]
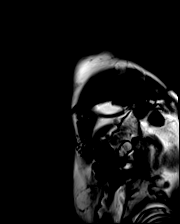

[Series 28: bSSFP · oblique · 8.0mm · 1.79mm/px · 1 of 25 slices shown (21 of 24)]
[im 1/25]
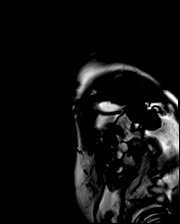

[Series 29: (id)_long_t1 · oblique · 8.0mm · 1.60mm/px · 1 of 24 slices shown]
[im 1/24]
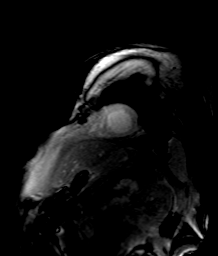

[Series 30: (id)_long_t1_moco · oblique · 8.0mm · 1.60mm/px · 1 of 24 slices shown]
[im 1/24]
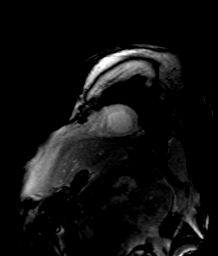

[Series 31: (id)_long_t1_moco_t1 · oblique · 8.0mm · 1.60mm/px · 1 of 3 slices shown (1 of 2)]
[im 1/3]
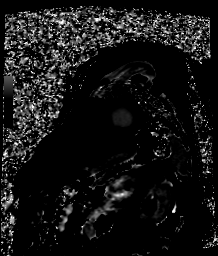

[Series 31: (id)_long_t1_moco_t1 · 1 of 3 slices shown (2 of 2)]
[im 1/3]
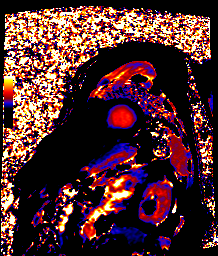

[Series 33: (id)_trufi · oblique · 8.0mm · 2.14mm/px · 1 of 9 slices shown]
[im 1/9]
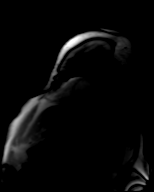

[Series 34: (id)_trufi_moco · oblique · 8.0mm · 2.14mm/px · 1 of 9 slices shown]
[im 1/9]
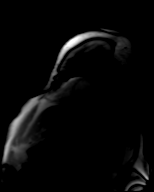

[Series 35: (id)_trufi_moco_t2 · oblique · 8.0mm · 2.14mm/px · 1 of 3 slices shown]
[im 1/3]
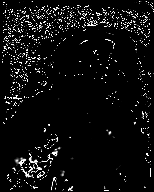

[Series 37: bSSFP · oblique · 6.0mm · 1.56mm/px · 1 of 25 slices shown (22 of 24)]
[im 1/25]
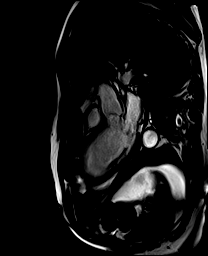

[Series 38: bSSFP · oblique · 6.0mm · 1.56mm/px · 1 of 25 slices shown (23 of 24)]
[im 1/25]
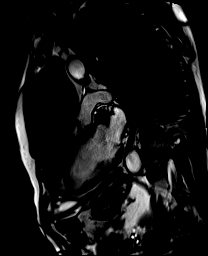

[Series 39: bSSFP · axial · 6.0mm · 1.48mm/px · 1 of 25 slices shown (24 of 24)]
[im 1/25]
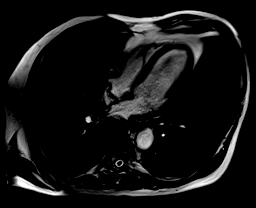

[Series 40: cine_trufi_cs_rt_short axis · oblique · 8.0mm · 1.73mm/px · 1 of 15 slices shown (1 of 13)]
[im 1/15]
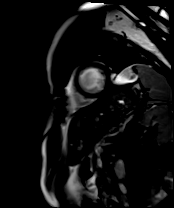

[Series 40: cine_trufi_cs_rt_short axis · oblique · 8.0mm · 1.73mm/px · 1 of 15 slices shown (2 of 13)]
[im 1/15]
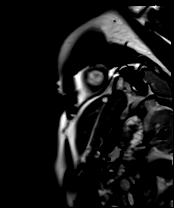

[Series 40: cine_trufi_cs_rt_short axis · oblique · 8.0mm · 1.73mm/px · 1 of 15 slices shown (3 of 13)]
[im 1/15]
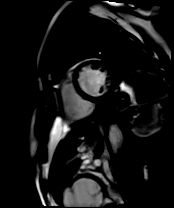

[Series 40: cine_trufi_cs_rt_short axis · oblique · 8.0mm · 1.73mm/px · 1 of 15 slices shown (4 of 13)]
[im 1/15]
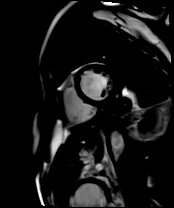

[Series 40: cine_trufi_cs_rt_short axis · oblique · 8.0mm · 1.73mm/px · 1 of 15 slices shown (5 of 13)]
[im 1/15]
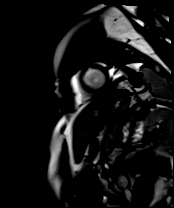

[Series 40: cine_trufi_cs_rt_short axis · oblique · 8.0mm · 1.73mm/px · 1 of 15 slices shown (6 of 13)]
[im 1/15]
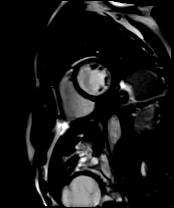

[Series 40: cine_trufi_cs_rt_short axis · oblique · 8.0mm · 1.73mm/px · 1 of 15 slices shown (7 of 13)]
[im 1/15]
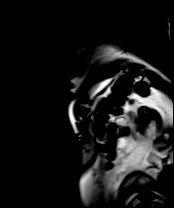

[Series 40: cine_trufi_cs_rt_short axis · oblique · 8.0mm · 1.73mm/px · 1 of 15 slices shown (8 of 13)]
[im 1/15]
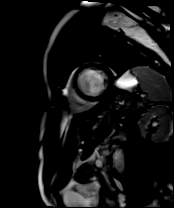

[Series 40: cine_trufi_cs_rt_short axis · oblique · 8.0mm · 1.73mm/px · 1 of 15 slices shown (9 of 13)]
[im 1/15]
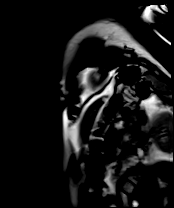

[Series 40: cine_trufi_cs_rt_short axis · oblique · 8.0mm · 1.73mm/px · 1 of 15 slices shown (10 of 13)]
[im 1/15]
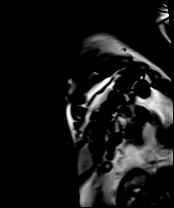

[Series 40: cine_trufi_cs_rt_short axis · oblique · 8.0mm · 1.73mm/px · 1 of 15 slices shown (11 of 13)]
[im 1/15]
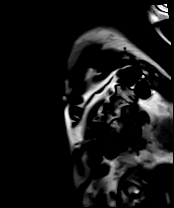

[Series 40: cine_trufi_cs_rt_short axis · oblique · 8.0mm · 1.73mm/px · 1 of 15 slices shown (12 of 13)]
[im 1/15]
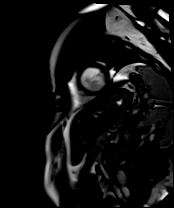

[Series 40: cine_trufi_cs_rt_short axis · oblique · 8.0mm · 1.73mm/px · 1 of 15 slices shown (13 of 13)]
[im 1/15]
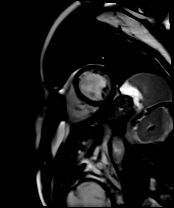

[45 of 48 positions shown; findings below may reference images not displayed]

FINDINGS: 1. Mild increase in left ventricular size, with LVEDD 52 mm, but
LVEDVi 100 mL/m2.

Normal left ventricular thickness, with intraventricular septal
thickness of 6 mm, posterior wall thickness of 6 mm.

Moderate to severe decrease in left ventricular systolic function
(LVEF =31%). There are regional wall motion abnormalities:
Anteroseptal base, mild, and apical hypokinesis; apical dyskinesis;
inferoseptal mid and apical hypokinesis; anterior mid and apical
hypokinesis.

Left ventricular parametric mapping notable for elevation in Native
T1 signal and T2 signal in the areas of wall motion abnormality
([NU] ms in mid anteroseptal and 63 ms in the mid anteroseptal
region highest values recorded).

There is late gadolinium enhancement in the left ventricular
myocardium: Transmural (100%) in the anteroseptal base, mid, and
apex; true apex; inferoseptal mid and apex; inferior apex.

There is no evidence of left ventricular thrombus.

2. Normal right ventricular size with RVEDVI 68 mL/m2.

Normal right ventricular thickness.

Normal right ventricular systolic function (RVEF =51%). There are no
regional wall motion abnormalities or aneurysms.

3. Normal left and right atrial size, with LAESV 25 mL and RAESV 22
mL.

4. Normal size of the aortic root, ascending aorta and pulmonary
artery.

5.  No significant valvular abnormalities.

6.  Normal pericardium.  No pericardial effusion.

7. Grossly, no extracardiac findings. Recommended dedicated study if
concerned for non-cardiac pathology.

8.  Breathhold artifact noted.
IMPRESSION: 1.  Mild left ventricular dilation.

2.  Moderate to severe left ventricular dysfunction (LVEF 31%).

3. LAD territory regional wall motion abnormalities as described
above, with parametric mapping and enhancement consistent with scar.

4. No evidence of left ventricular thrombus- though substrate for
occurrence exists.

## 2020-08-27 MED ORDER — GADOBUTROL 1 MMOL/ML IV SOLN
11.0000 mL | Freq: Once | INTRAVENOUS | Status: AC | PRN
  Administered 2020-08-27: 11 mL via INTRAVENOUS

## 2020-08-28 NOTE — Progress Notes (Addendum)
CARDIOLOGY OFFICE NOTE  Date:  09/09/2020    Douglas [name] Date of Birth: 1956-05-21 Medical Record #[id number]  PCP:  Douglas Mclean, MD  Cardiologist:  Douglas [name]  No chief complaint on file.   History of Present Illness:  65 y.o. 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 Followed at [facility] for years  In clinical trials. Has been on Tarceva and Avastin However had STEMI with DES to proximal LAD 05/24/20 And residual 50% Ramus. EF was 35-40% Due to CAD and DAT he was taken off his Avastin and Tarceva both contraindicated with ischemic heart disease. 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 Wife Douglas [name] with him today She has had brain surgery for enlarged cyst complicated by bleed and bipolar Dx  MRI 08/27/20 E 31% LAD scar no mural apical thrombus  Eliquis stopped  Discussed possible need for AICD in future He is not volume overloaded and functional class one Needs [facility] cancer in light of his CAD and moderately reduced EF  Dr Franki Monte [phone] called directly to discuss She is contemplating using Nivolumab as single Agent immunoRx starting at end of April This drug has a small risk of myocarditis but is certainly safer than avastin and tarceva   Past Medical History:  Diagnosis Date  . Allergic rhinitis   . Arthritis    "all over my body" (10/16/2013)  . Chronic back pain    "neck to lower back" (10/16/2013)  . Compression fracture    S/P L-SPINE; pt does not recall this hx on 10/16/2013  . COPD (chronic obstructive pulmonary disease) (HCC)    FVC .66  . Coronary artery disease   . Depression    "situational since 09/22/2013 OR"  . Encounter for long-term (current) use of other medications   . GERD (gastroesophageal reflux disease)   . Hemoptysis   . Hereditary leiomyomatosis and renal cell  cancer (HLRCC)   . History of blood transfusion 09/2013   "think so; not sure; related to big OR"  . History of surgical fusion joint    DDD C-SPINE  . Hyperlipidemia   . Hypertension   . Leiomyoma OF SKIN   INCREASED RISK RENAL CELL CA  NEEDS CT OF KIDNEYS EVERY 2 YEARS NEXT DUE  02/06/13  . Melanoma in situ ([place])   . Melanoma of back ([place])   . Neuromuscular disorder ([place])   . Osteoarthritis   . Renal calculi   . Renal cell carcinoma ([place])    type 2; papillary  . Tobacco use disorder   . Unspecified vitamin D deficiency     Past Surgical History:  Procedure Laterality Date  . ANTERIOR CERVICAL DECOMP/DISCECTOMY FUSION  1992  . ANTERIOR CRUCIATE LIGAMENT REPAIR Bilateral [phone]  . APPENDECTOMY  09/22/2013  . CARDIAC CATHETERIZATION    . CHOLECYSTECTOMY  09/22/2013   "had tumors in it"  . CORONARY STENT INTERVENTION N/A 05/24/2020   Procedure: CORONARY STENT INTERVENTION;  Surgeon: Martinique, Adolf Ormiston M, MD;  Location: [facility];  Service: Cardiovascular;  Laterality: N/A;  . HAND LIGAMENT RECONSTRUCTION Right   . HARVEST BONE GRAFT Right 1992   "hip; for neck fusion"  . INGUINAL HERNIA REPAIR Right 2004  . LEFT HEART CATH AND CORONARY ANGIOGRAPHY N/A 05/24/2020   Procedure: LEFT HEART CATH AND CORONARY  ANGIOGRAPHY;  Surgeon: Martinique, Nailyn Dearinger M, MD;  Location: [facility];  Service: Cardiovascular;  Laterality: N/A;  . LIGAMENT REPAIR Right ~ 2001   "little finger"  . LYMPH NODE DISSECTION Right 09/22/2013   "in the area of the kidney; they were clean"  . MELANOMA EXCISION  ~ 2007   "off my back"  . PARTIAL NEPHRECTOMY Right 09/22/2013  . removal of fascia overlying the right psoas Right 09/22/2013  . RENAL ARTERY STENT Right 09/22/2013  . RIGHT COLECTOMY Right 09/22/2013  . ROBOTIC ASSITED PARTIAL NEPHRECTOMY  05/22/2012   Procedure: ROBOTIC ASSITED PARTIAL NEPHRECTOMY;  Surgeon: Alexis Frock, MD;  Location: [facility];  Service: Urology;  Laterality: Right;   Right Robotic Cyst Decortication and Partial Nephrectomy   . SPINE SURGERY       Medications: Current Meds  Medication Sig  . aspirin EC 81 MG tablet Take 1 tablet (81 mg total) by mouth daily. Swallow whole.  . B Complex Vitamins (B COMPLEX 100 PO) Take 100 mg by mouth daily.  . calcium carbonate (TUMS - DOSED IN MG ELEMENTAL CALCIUM) 500 MG chewable tablet Chew 500 mg by mouth 3 (three) times daily as needed for indigestion or heartburn.  . carvedilol (COREG) 3.125 MG tablet Take 1 tablet (3.125 mg total) by mouth 2 (two) times daily with a meal.  . cetirizine (ZYRTEC) 10 MG tablet Take 5 mg by mouth daily.   . cholecalciferol (VITAMIN D3) 25 MCG (1000 UNIT) tablet Take 2,000 Units by mouth daily.  . clopidogrel (PLAVIX) 75 MG tablet Take 1 tablet (75 mg total) by mouth daily.  Marland Kitchen denosumab (XGEVA) 120 MG/1.7ML SOLN injection Inject 120 mg into the skin every 30 (thirty) days.   . DULoxetine (CYMBALTA) 20 MG capsule TAKE 1 CAPSULE BY MOUTH EVERY DAY  . HM LIDOCAINE PATCH EX Apply 5 % topically as needed.  Marland Kitchen HYDROcodone-acetaminophen (NORCO) 7.5-325 MG tablet Take 2 tablets by mouth See admin instructions. 2 every 4 hours through out the day until taking ativan at bedtime, do not mix  . loperamide (IMODIUM A-D) 2 MG tablet Take 2 mg by mouth 4 (four) times daily as needed for diarrhea or loose stools.  Marland Kitchen LORazepam (ATIVAN) 1 MG tablet Take 0.5-1 tablets (0.5-1 mg total) by mouth at bedtime as needed for sleep. TAKE ONE HALF OR ONE TABLET BY MOUTH AT BEDTIME IF NEEDED FOR SLEEP, DO NOT TAKE WITH PAIN MEDS  . Multiple Vitamins-Minerals (CENTRUM SILVER 50+MEN PO) Take 1 tablet by mouth daily.  . nitroGLYCERIN (NITROSTAT) 0.4 MG SL tablet Place 1 tablet (0.4 mg total) under the tongue every 5 (five) minutes x 3 doses as needed for chest pain. Please dispense #25 x 4 bottle  . ondansetron (ZOFRAN-ODT) 4 MG disintegrating tablet TAKE 1 TABLET BY MOUTH SUBLINGUALLY EVERY 8 HOURS AS NEEDED FOR NAUSEA  OR VOMITING  . pantoprazole (PROTONIX) 20 MG tablet Take 20 mg by mouth daily.  . rosuvastatin (CRESTOR) 10 MG tablet Take 1 tablet (10 mg total) by mouth daily.  . sacubitril-valsartan (ENTRESTO) 24-26 MG Take 1 tablet by mouth 2 (two) times daily.  . traZODone (DESYREL) 50 MG tablet TAKE 0.5-1 TABLETS BY MOUTH AT BEDTIME AS NEEDED FOR SLEEP.  Marland Kitchen UNABLE TO FIND Take 1 tablet by mouth daily. Med Name: Calcium 600/D-3 300 2 daily  . vitamin C (ASCORBIC ACID) 500 MG tablet Take 500 mg by mouth daily.     Allergies: Allergies  Allergen Reactions  . Avelox [Moxifloxacin Hcl  In Nacl] Hives, Shortness Of Breath and Swelling  . Medical Adhesive Remover Dermatitis and Rash  . Tape Dermatitis and Rash    Social History: The patient  reports that he has quit smoking. His smoking use included cigarettes. He has a 20.00 pack-year smoking history. He has never used smokeless tobacco. He reports current alcohol use. He reports current drug use. Drug: Marijuana.   Family History: The patient's family history includes Hypertension in his father. He was adopted.   Review of Systems: Please see the history of present illness.   All other systems are reviewed and negative.   Physical Exam: VS:  BP 118/62   Pulse (!) 59   Ht 6' (1.829 m)   Wt 77.1 kg   SpO2 98%   BMI 23.06 kg/m  .  BMI Body mass index is 23.06 kg/m.  Wt Readings from Last 3 Encounters:  09/09/20 77.1 kg  08/20/20 73.9 kg  07/20/20 74.1 kg   Affect appropriate Healthy:  appears stated age 42: normal Neck supple with no adenopathy JVP normal no bruits no thyromegaly Lungs clear with no wheezing and good diaphragmatic motion Heart:  S1/S2 no murmur, no rub, gallop or click PMI normal Abdomen: benighn, BS positve, no tenderness, no AAA no bruit.  No HSM or HJR Distal pulses intact with no bruits No edema Neuro non-focal Skin warm and dry No muscular weakness   LABORATORY DATA:  EKG:   05/25/20 SR acute  anterolateral ST elevation MI  Lab Results  Component Value Date   WBC 6.9 08/26/2020   HGB 12.4 (L) 08/26/2020   HCT 36.8 (L) 08/26/2020   PLT 205 08/26/2020   GLUCOSE 67 08/26/2020   CHOL 146 06/02/2020   TRIG 244 (H) 06/02/2020   HDL 50 06/02/2020   LDLDIRECT 63.0 05/01/2019   LDLCALC 57 06/02/2020   ALT 32 06/02/2020   AST 26 06/02/2020   NA 142 08/26/2020   K 4.3 08/26/2020   CL 108 (H) 08/26/2020   CREATININE 1.10 08/26/2020   BUN 17 08/26/2020   CO2 20 08/26/2020   TSH 1.376 05/24/2020   PSA 1.86 04/14/2020   INR 1.0 05/24/2020   HGBA1C 5.2 05/29/2016     BNP (last 3 results) No results for input(s): BNP in the last 8760 hours.  ProBNP (last 3 results) No results for input(s): PROBNP in the last 8760 hours.   Other Studies Reviewed Today:  Cardiac cath 05/24/20   Prox LAD lesion is 90% stenosed.  Ramus lesion is 50% stenosed.  Mid RCA lesion is 35% stenosed.  Post intervention, there is a 0% residual stenosis.  A drug-eluting stent was successfully placed using a STENT RESOLUTE ONYX 3.0X22.  LV end diastolic pressure is normal.  1. Single vessel occlusive CAD involving the proximal LAD 2. Normal LVEDP 3. Successful PCI of the proximal LAD with DES x 1  Plan: DAPT for one year. Risk factor modification. Will assess LV function with Echo.  Echo Impression 05/25/20  1. Left ventricular ejection fraction, by estimation, is 35 to 40%. The  left ventricle has moderately decreased function. The left ventricle has  no regional wall motion abnormalities. Left ventricular diastolic  parameters are consistent with Grade I  diastolic dysfunction (impaired relaxation). There is mild apical  dyskinesis. There is severe hypokinesis of the entire anterior septum, but  the basal and mid segments of the anterior wall have preserved  contractility.  2. Right ventricular systolic function is normal. The right ventricular  size is normal.  3. The mitral  valve is normal in structure. No evidence of mitral valve  regurgitation. No evidence of mitral stenosis.  4. The aortic valve is normal in structure. Aortic valve regurgitation is  not visualized. No aortic stenosis is present.  5. The inferior vena cava is normal in size with greater than 50%  respiratory variability, suggesting right atrial pressure of 3 mmHg.    Assessment/Plan: 1. STEMI -  05/24/20 with one vessel LAD CAD - s/p PCI - on DAPT has caused issue with using Tarceva and avastin for his cancer as both of these are contraindicated and not recommended for use with CAD  2. ICM - chronic systolic HF - euvolemic on entresto and coreg EF 35-40% by TTE 05/25/20 EF 31% MRI 08/27/20 no mural apical thrombus   3. HLD - he is on statin - he needs to get his LFTs checked - may need to consider lipid clinic referral.   4. Elevated LFTs - resolved normal 06/02/20 likely related to acute MI  5. Hereditary leiomyomatosis and renal cell cancer with mets - [facility] - apparently off his study drugs with the recent stent. Unclear to me. Very challenging situation for him. To see Dr Franki Monte at [facility] April and likely start single agent immunoRx with Nivolumab  Current medicines are reviewed with the patient today.  The patient does not have concerns regarding medicines other than what has been noted above.  The following changes have been made:  See above.  None  Echo for ischemic DCM  No orders of the defined types were placed in this encounter.    Disposition:   FU 36months    Signed: Jenkins Rouge, MD  09/09/2020 10:53 AM  [facility] [address] [place], [place]  [address] Phone: [phone] Fax: [phone]

## 2020-08-28 NOTE — Progress Notes (Incomplete)
CARDIOLOGY OFFICE NOTE  Date:  08/28/2020    Arlyce Harman Date of Birth: [birth year] Medical Record #[id number]  PCP:  Darreld Mclean, MD  Cardiologist:  Johnsie Cancel  No chief complaint on file.   History of Present Illness:  65 y.o. 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 Followed at [facility] for years  In clinical trials. Has been on Tarceva and Avastin However had STEMI with DES to proximal LAD 05/24/20 And residual 50% Ramus. EF was 35-40% Due to CAD and DAT he was taken off his Avastin and Tarceva both contraindicated with ischemic heart disease. 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 Allergic rhinitis   . Arthritis    "all over my body" (10/16/2013)  . Chronic back pain    "neck to lower back" (10/16/2013)  . Compression fracture    S/P L-SPINE; pt does not recall this hx on 10/16/2013  . COPD (chronic obstructive pulmonary disease) (HCC)    FVC .66  . Coronary artery disease   . Depression    "situational since 09/22/2013 OR"  . Encounter for long-term (current) use of other medications   . GERD (gastroesophageal reflux disease)   . Hemoptysis   . 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 History of blood transfusion 09/2013   "think so; not sure; related to big OR"  . History of surgical fusion joint    DDD C-SPINE  . Hyperlipidemia   . Hypertension   . Leiomyoma OF SKIN   INCREASED RISK RENAL CELL CA  NEEDS CT OF KIDNEYS EVERY 2 YEARS NEXT DUE  02/06/13  . Melanoma in situ ([place])   . Melanoma of back ([place])   . Neuromuscular disorder ([place])   . Osteoarthritis   . Renal calculi   . Renal  cell carcinoma ([place])    type 2; papillary  . Tobacco use disorder   . Unspecified vitamin D deficiency     Past Surgical History:  Procedure Laterality Date  . ANTERIOR CERVICAL DECOMP/DISCECTOMY FUSION  1992  . ANTERIOR CRUCIATE LIGAMENT REPAIR Bilateral [phone]  . APPENDECTOMY  09/22/2013  . CARDIAC CATHETERIZATION    . CHOLECYSTECTOMY  09/22/2013   "had tumors in it"  . CORONARY STENT INTERVENTION N/A 05/24/2020   Procedure: CORONARY STENT INTERVENTION;  Surgeon: Martinique, Peter M, MD;  Location: [facility];  Service: Cardiovascular;  Laterality: N/A;  . HAND LIGAMENT RECONSTRUCTION Right   . HARVEST BONE GRAFT Right 1992   "hip; for neck fusion"  . INGUINAL HERNIA REPAIR Right 2004  . LEFT HEART CATH AND CORONARY ANGIOGRAPHY N/A 05/24/2020   Procedure: LEFT HEART CATH AND CORONARY ANGIOGRAPHY;  Surgeon: Martinique, Peter M, MD;  Location: [facility];  Service: Cardiovascular;  Laterality: N/A;  . LIGAMENT REPAIR Right ~ 2001   "little finger"  . LYMPH NODE DISSECTION Right 09/22/2013   "in the area of the kidney; they were clean"  . MELANOMA EXCISION  ~ 2007   "off my back"  . PARTIAL NEPHRECTOMY Right 09/22/2013  . removal of fascia overlying  the right psoas Right 09/22/2013  . RENAL ARTERY STENT Right 09/22/2013  . RIGHT COLECTOMY Right 09/22/2013  . ROBOTIC ASSITED PARTIAL NEPHRECTOMY  05/22/2012   Procedure: ROBOTIC ASSITED PARTIAL NEPHRECTOMY;  Surgeon: Alexis Frock, MD;  [facility];  Service: Urology;  Laterality: Right;  Right Robotic Cyst Decortication and Partial Nephrectomy   . SPINE SURGERY       Medications: No outpatient medications have been marked as taking for the 09/09/20 encounter (Appointment) with Josue Hector, MD.     Allergies: Allergies  Allergen Reactions  . Avelox [Moxifloxacin Hcl In Nacl] Hives, Shortness Of Breath and Swelling  . Medical Adhesive Remover Dermatitis and Rash  . Tape Dermatitis and Rash    Social  History: The patient  reports that he has quit smoking. His smoking use included cigarettes. He has a 20.00 pack-year smoking history. He has never used smokeless tobacco. He reports current alcohol use. He reports current drug use. Drug: Marijuana.   Family History: The patient's family history includes Hypertension in his father. He was adopted.   Review of Systems: Please see the history of present illness.   All other systems are reviewed and negative.   Physical Exam: VS:  There were no vitals taken for this visit. Marland Kitchen  BMI There is no height or weight on file to calculate BMI.  Wt Readings from Last 3 Encounters:  08/20/20 73.9 kg  07/20/20 74.1 kg  07/02/20 74.7 kg   Affect appropriate Healthy:  appears stated age 2: normal Neck supple with no adenopathy JVP normal no bruits no thyromegaly Lungs clear with no wheezing and good diaphragmatic motion Heart:  S1/S2 no murmur, no rub, gallop or click PMI normal Abdomen: benighn, BS positve, no tenderness, no AAA no bruit.  No HSM or HJR Distal pulses intact with no bruits No edema Neuro non-focal Skin warm and dry No muscular weakness   LABORATORY DATA:  EKG:   05/25/20 SR acute anterolateral ST elevation MI  Lab Results  Component Value Date   WBC 6.9 08/26/2020   HGB 12.4 (L) 08/26/2020   HCT 36.8 (L) 08/26/2020   PLT 205 08/26/2020   GLUCOSE 67 08/26/2020   CHOL 146 06/02/2020   TRIG 244 (H) 06/02/2020   HDL 50 06/02/2020   LDLDIRECT 63.0 05/01/2019   LDLCALC 57 06/02/2020   ALT 32 06/02/2020   AST 26 06/02/2020   NA 142 08/26/2020   K 4.3 08/26/2020   CL 108 (H) 08/26/2020   CREATININE 1.10 08/26/2020   BUN 17 08/26/2020   CO2 20 08/26/2020   TSH 1.376 05/24/2020   PSA 1.86 04/14/2020   INR 1.0 05/24/2020   HGBA1C 5.2 05/29/2016     BNP (last 3 results) No results for input(s): BNP in the last 8760 hours.  ProBNP (last 3 results) No results for input(s): PROBNP in the last 8760  hours.   Other Studies Reviewed Today:  Cardiac cath 05/24/20   Prox LAD lesion is 90% stenosed.  Ramus lesion is 50% stenosed.  Mid RCA lesion is 35% stenosed.  Post intervention, there is a 0% residual stenosis.  A drug-eluting stent was successfully placed using a STENT RESOLUTE ONYX 3.0X22.  LV end diastolic pressure is normal.  1. Single vessel occlusive CAD involving the proximal LAD 2. Normal LVEDP 3. Successful PCI of the proximal LAD with DES x 1  Plan: DAPT for one year. Risk factor modification. Will assess LV function with Echo.  Echo Impression 05/25/20  1. Left ventricular ejection fraction, by estimation, is 35 to 40%. The  left ventricle has moderately decreased function. The left ventricle has  no regional wall motion abnormalities. Left ventricular diastolic  parameters are consistent with Grade I  diastolic dysfunction (impaired relaxation). There is mild apical  dyskinesis. There is severe hypokinesis of the entire anterior septum, but  the basal and mid segments of the anterior wall have preserved  contractility.  2. Right ventricular systolic function is normal. The right ventricular  size is normal.  3. The mitral valve is normal in structure. No evidence of mitral valve  regurgitation. No evidence of mitral stenosis.  4. The aortic valve is normal in structure. Aortic valve regurgitation is  not visualized. No aortic stenosis is present.  5. The inferior vena cava is normal in size with greater than 50%  respiratory variability, suggesting right atrial pressure of 3 mmHg.    Assessment/Plan: 1. STEMI -  05/24/20 with one vessel LAD CAD - s/p PCI - on DAPT has caused issue with using Tarceva and avastin for his cancer as both of these are contraindicated and not recommended for use with CAD  2. ICM - chronic systolic HF - euvolemic on entresto and coreg EF 35-40% by TTE 05/25/20 EF 31% MRI 08/27/20 no mural apical thrombus   3. HLD - he is  on statin - he needs to get his LFTs checked - may need to consider lipid clinic referral.   4. Elevated LFTs - resolved normal 06/02/20 likely related to acute MI  5. Hereditary leiomyomatosis and renal cell cancer with mets - [facility] - apparently off his study drugs with the recent stent. Unclear to me. Very challenging situation for him.   Current medicines are reviewed with the patient today.  The patient does not have concerns regarding medicines other than what has been noted above.  The following changes have been made:  See above.  ***  No orders of the defined types were placed in this encounter.    Disposition:   FU 77months    Signed: Jenkins Rouge, MD  08/28/2020 11:57 PM  [facility] [address] [place], [place]  [address] Phone: [phone] Fax: [phone]

## 2020-08-30 ENCOUNTER — Other Ambulatory Visit: Payer: Self-pay

## 2020-08-30 ENCOUNTER — Telehealth: Payer: Self-pay | Admitting: Cardiovascular Disease

## 2020-08-30 ENCOUNTER — Encounter (HOSPITAL_COMMUNITY)
Admission: RE | Admit: 2020-08-30 | Discharge: 2020-08-30 | Disposition: A | Discharge status: Home / Self Care | Payer: Medicare HMO | Source: Ambulatory Visit | Attending: Cardiovascular Disease | Admitting: Cardiovascular Disease

## 2020-08-30 DIAGNOSIS — I2102 ST elevation (STEMI) myocardial infarction involving left anterior descending coronary artery: Secondary | ICD-10-CM

## 2020-08-30 DIAGNOSIS — I214 Non-ST elevation (NSTEMI) myocardial infarction: Secondary | ICD-10-CM

## 2020-08-30 DIAGNOSIS — Z955 Presence of coronary angioplasty implant and graft: Secondary | ICD-10-CM

## 2020-08-30 DIAGNOSIS — I213 ST elevation (STEMI) myocardial infarction of unspecified site: Secondary | ICD-10-CM | POA: Diagnosis not present

## 2020-08-30 NOTE — Telephone Encounter (Signed)
Patient calling to get the results of his Cardiac MRI that was done Friday 08/27/20

## 2020-08-31 ENCOUNTER — Encounter: Payer: Self-pay | Admitting: Family Medicine

## 2020-08-31 DIAGNOSIS — G893 Neoplasm related pain (acute) (chronic): Secondary | ICD-10-CM

## 2020-08-31 DIAGNOSIS — C641 Malignant neoplasm of right kidney, except renal pelvis: Secondary | ICD-10-CM

## 2020-08-31 MED ORDER — ASPIRIN EC 81 MG PO TBEC: 81.0000 mg | DELAYED_RELEASE_TABLET | Freq: Every day | ORAL | Status: DC

## 2020-08-31 NOTE — Addendum Note (Signed)
Addended by: Aris Georgia, Sugar Vanzandt L on: 08/31/2020 01:20 PM   Modules accepted: Orders

## 2020-08-31 NOTE — Telephone Encounter (Signed)
Called patient back to let him know about results of MRI and medication changes. Patient verbalized understanding and will keep his appointment next week with [name].

## 2020-08-31 NOTE — Telephone Encounter (Signed)
The MRI is the gold standard test to evaluate for LV thrombus. With no thrombus present, best to go back on his prior regimen with antiplatelet therapy alone. He should STOP Eliquis. If his MI was acute, anticoagulation would be reasonable with his substrate, but he is already a few months out from the MI so there really is no indication for anticoagulation at this time. Let me know if any questions. I'm happy to discuss with him while Dr Johnsie Cancel is away.

## 2020-08-31 NOTE — Telephone Encounter (Signed)
Pt called in and was checking on the status of the MRI .  He is also questioning how he is suppose to be taking the Eliquis til he see Dr Johnsie Cancel Next week    Best number [phone]

## 2020-09-01 ENCOUNTER — Encounter (HOSPITAL_COMMUNITY): Payer: Medicare HMO

## 2020-09-01 MED ORDER — HYDROCODONE-ACETAMINOPHEN 7.5-325 MG PO TABS
2.0000 | ORAL_TABLET | ORAL | 0 refills | Status: DC
Start: 2020-09-01 — End: 2020-09-30

## 2020-09-03 ENCOUNTER — Encounter (HOSPITAL_COMMUNITY): Payer: Medicare HMO

## 2020-09-06 ENCOUNTER — Encounter (HOSPITAL_COMMUNITY): Payer: Medicare HMO

## 2020-09-06 ENCOUNTER — Ambulatory Visit: Payer: Medicare HMO | Admitting: Cardiovascular Disease

## 2020-09-08 ENCOUNTER — Encounter (HOSPITAL_COMMUNITY)
Admission: RE | Admit: 2020-09-08 | Discharge: 2020-09-08 | Disposition: A | Discharge status: Home / Self Care | Payer: Medicare HMO | Source: Ambulatory Visit | Attending: Cardiovascular Disease | Admitting: Cardiovascular Disease

## 2020-09-08 ENCOUNTER — Other Ambulatory Visit: Payer: Self-pay

## 2020-09-08 DIAGNOSIS — Z955 Presence of coronary angioplasty implant and graft: Secondary | ICD-10-CM | POA: Diagnosis not present

## 2020-09-08 DIAGNOSIS — I213 ST elevation (STEMI) myocardial infarction of unspecified site: Secondary | ICD-10-CM | POA: Diagnosis not present

## 2020-09-08 DIAGNOSIS — I2102 ST elevation (STEMI) myocardial infarction involving left anterior descending coronary artery: Secondary | ICD-10-CM

## 2020-09-09 ENCOUNTER — Ambulatory Visit: Payer: Medicare HMO | Admitting: Cardiovascular Disease

## 2020-09-09 ENCOUNTER — Encounter: Payer: Self-pay | Admitting: Cardiovascular Disease

## 2020-09-09 ENCOUNTER — Telehealth: Payer: Self-pay | Admitting: Cardiovascular Disease

## 2020-09-09 VITALS — BP 118/62 | HR 59 | Ht 72.0 in | Wt 170.0 lb

## 2020-09-09 DIAGNOSIS — I255 Ischemic cardiomyopathy: Secondary | ICD-10-CM

## 2020-09-09 NOTE — Patient Instructions (Addendum)
Medication Instructions:  *If you need a refill on your cardiac medications before your next appointment, please call your pharmacy*  Lab Work: If you have labs (blood work) drawn today and your tests are completely normal, you will receive your results only by: Marland Kitchen MyChart Message (if you have MyChart) OR . A paper copy in the mail If you have any lab test that is abnormal or we need to change your treatment, we will call you to review the results.  Testing/Procedures: None ordered today.   Follow-Up: At [facility], you and your health needs are our priority.  As part of our continuing mission to provide you with exceptional heart care, we have created designated Provider Care Teams.  These Care Teams include your primary Cardiologist (physician) and Advanced Practice Providers (APPs -  Physician Assistants and Nurse Practitioners) who all work together to provide you with the care you need, when you need it.  We recommend signing up for the patient portal called "MyChart".  Sign up information is provided on this After Visit Summary.  MyChart is used to connect with patients for Virtual Visits (Telemedicine).  Patients are able to view lab/test results, encounter notes, upcoming appointments, etc.  Non-urgent messages can be sent to your provider as well.   To learn more about what you can do with MyChart, go to [email or web address].    Your next appointment:   3 month(s)  The format for your next appointment:   In Person  Provider:   You may see Jenkins Rouge, MD or one of the following Advanced Practice Providers on your designated Care Team:    Kathyrn Drown, NP

## 2020-09-09 NOTE — Telephone Encounter (Signed)
  Dr Mosie Lukes returned a call from Dr Johnsie Cancel. Message sent to Dr Johnsie Cancel and Jeannene Patella I with Dr Ali Lowe contact information.

## 2020-09-10 ENCOUNTER — Encounter: Payer: Self-pay | Admitting: Family Medicine

## 2020-09-10 ENCOUNTER — Encounter (HOSPITAL_COMMUNITY)
Admission: RE | Admit: 2020-09-10 | Discharge: 2020-09-10 | Disposition: A | Discharge status: Home / Self Care | Payer: Medicare HMO | Source: Ambulatory Visit | Attending: Cardiovascular Disease | Admitting: Cardiovascular Disease

## 2020-09-10 ENCOUNTER — Other Ambulatory Visit: Payer: Self-pay

## 2020-09-10 DIAGNOSIS — I2102 ST elevation (STEMI) myocardial infarction involving left anterior descending coronary artery: Secondary | ICD-10-CM

## 2020-09-10 DIAGNOSIS — Z955 Presence of coronary angioplasty implant and graft: Secondary | ICD-10-CM | POA: Diagnosis not present

## 2020-09-10 DIAGNOSIS — I214 Non-ST elevation (NSTEMI) myocardial infarction: Secondary | ICD-10-CM

## 2020-09-10 DIAGNOSIS — I213 ST elevation (STEMI) myocardial infarction of unspecified site: Secondary | ICD-10-CM | POA: Diagnosis not present

## 2020-09-13 ENCOUNTER — Encounter (HOSPITAL_COMMUNITY)
Admission: RE | Admit: 2020-09-13 | Discharge: 2020-09-13 | Disposition: A | Discharge status: Home / Self Care | Payer: Medicare HMO | Source: Ambulatory Visit | Attending: Cardiovascular Disease | Admitting: Cardiovascular Disease

## 2020-09-13 ENCOUNTER — Other Ambulatory Visit: Payer: Self-pay

## 2020-09-13 DIAGNOSIS — Z955 Presence of coronary angioplasty implant and graft: Secondary | ICD-10-CM | POA: Diagnosis not present

## 2020-09-13 DIAGNOSIS — I213 ST elevation (STEMI) myocardial infarction of unspecified site: Secondary | ICD-10-CM | POA: Diagnosis not present

## 2020-09-13 DIAGNOSIS — I2102 ST elevation (STEMI) myocardial infarction involving left anterior descending coronary artery: Secondary | ICD-10-CM

## 2020-09-14 DIAGNOSIS — C649 Malignant neoplasm of unspecified kidney, except renal pelvis: Secondary | ICD-10-CM | POA: Diagnosis not present

## 2020-09-14 DIAGNOSIS — R809 Proteinuria, unspecified: Secondary | ICD-10-CM | POA: Diagnosis not present

## 2020-09-14 DIAGNOSIS — C7951 Secondary malignant neoplasm of bone: Secondary | ICD-10-CM | POA: Diagnosis not present

## 2020-09-14 DIAGNOSIS — Z79899 Other long term (current) drug therapy: Secondary | ICD-10-CM | POA: Diagnosis not present

## 2020-09-14 DIAGNOSIS — Z1509 Genetic susceptibility to other malignant neoplasm: Secondary | ICD-10-CM | POA: Diagnosis not present

## 2020-09-14 DIAGNOSIS — D580 Hereditary spherocytosis: Secondary | ICD-10-CM | POA: Diagnosis not present

## 2020-09-14 DIAGNOSIS — K4090 Unilateral inguinal hernia, without obstruction or gangrene, not specified as recurrent: Secondary | ICD-10-CM | POA: Diagnosis not present

## 2020-09-14 DIAGNOSIS — I252 Old myocardial infarction: Secondary | ICD-10-CM | POA: Diagnosis not present

## 2020-09-14 DIAGNOSIS — Z7983 Long term (current) use of bisphosphonates: Secondary | ICD-10-CM | POA: Diagnosis not present

## 2020-09-14 NOTE — Progress Notes (Signed)
Cardiac Individual Treatment Plan  Patient Details  Name: Douglas [name] MRN: [id number] Date of Birth: 03/03/1956 Referring Provider:   Flowsheet Row CARDIAC REHAB PHASE II ORIENTATION from 07/20/2020 in [place]  Referring Provider Jenkins Rouge, MD      Initial Encounter Date:  [place] PHASE II ORIENTATION from 07/20/2020 in [place]  Date 07/20/20      Visit Diagnosis: 05/24/20 STEMI   05/24/20 S/P DES LAD  Patient's Home Medications on Admission:  Current Outpatient Medications:  .  aspirin EC 81 MG tablet, Take 1 tablet (81 mg total) by mouth daily. Swallow whole., Disp: , Rfl:  .  B Complex Vitamins (B COMPLEX 100 PO), Take 100 mg by mouth daily., Disp: , Rfl:  .  calcium carbonate (TUMS - DOSED IN MG ELEMENTAL CALCIUM) 500 MG chewable tablet, Chew 500 mg by mouth 3 (three) times daily as needed for indigestion or heartburn., Disp: , Rfl:  .  carvedilol (COREG) 3.125 MG tablet, Take 1 tablet (3.125 mg total) by mouth 2 (two) times daily with a meal., Disp: 60 tablet, Rfl: 6 .  cetirizine (ZYRTEC) 10 MG tablet, Take 5 mg by mouth daily. , Disp: , Rfl:  .  cholecalciferol (VITAMIN D3) 25 MCG (1000 UNIT) tablet, Take 2,000 Units by mouth daily., Disp: , Rfl:  .  clopidogrel (PLAVIX) 75 MG tablet, Take 1 tablet (75 mg total) by mouth daily., Disp: 30 tablet, Rfl: 11 .  denosumab (XGEVA) 120 MG/1.7ML SOLN injection, Inject 120 mg into the skin every 30 (thirty) days. , Disp: , Rfl:  .  DULoxetine (CYMBALTA) 20 MG capsule, TAKE 1 CAPSULE BY MOUTH EVERY DAY, Disp: 90 capsule, Rfl: 3 .  HM LIDOCAINE PATCH EX, Apply 5 % topically as needed., Disp: , Rfl:  .  HYDROcodone-acetaminophen (NORCO) 7.5-325 MG tablet, Take 2 tablets by mouth See admin instructions. 2 every 4 hours through out the day until taking ativan at bedtime, do not mix, Disp: 240 tablet, Rfl: 0 .  loperamide (IMODIUM A-D) 2 MG tablet,  Take 2 mg by mouth 4 (four) times daily as needed for diarrhea or loose stools., Disp: , Rfl:  .  LORazepam (ATIVAN) 1 MG tablet, Take 0.5-1 tablets (0.5-1 mg total) by mouth at bedtime as needed for sleep. TAKE ONE HALF OR ONE TABLET BY MOUTH AT BEDTIME IF NEEDED FOR SLEEP, DO NOT TAKE WITH PAIN MEDS, Disp: 30 tablet, Rfl: 5 .  Multiple Vitamins-Minerals (CENTRUM SILVER 50+MEN PO), Take 1 tablet by mouth daily., Disp: , Rfl:  .  nitroGLYCERIN (NITROSTAT) 0.4 MG SL tablet, Place 1 tablet (0.4 mg total) under the tongue every 5 (five) minutes x 3 doses as needed for chest pain. Please dispense #25 x 4 bottle, Disp: 100 tablet, Rfl: 1 .  ondansetron (ZOFRAN-ODT) 4 MG disintegrating tablet, TAKE 1 TABLET BY MOUTH SUBLINGUALLY EVERY 8 HOURS AS NEEDED FOR NAUSEA OR VOMITING, Disp: 40 tablet, Rfl: 2 .  pantoprazole (PROTONIX) 20 MG tablet, Take 20 mg by mouth daily., Disp: , Rfl:  .  rosuvastatin (CRESTOR) 10 MG tablet, Take 1 tablet (10 mg total) by mouth daily., Disp: 30 tablet, Rfl: 6 .  sacubitril-valsartan (ENTRESTO) 24-26 MG, Take 1 tablet by mouth 2 (two) times daily., Disp: 60 tablet, Rfl: 6 .  traZODone (DESYREL) 50 MG tablet, TAKE 0.5-1 TABLETS BY MOUTH AT BEDTIME AS NEEDED FOR SLEEP., Disp: 90 tablet, Rfl: 2 .  UNABLE TO FIND, Take  1 tablet by mouth daily. Med Name: Calcium 600/D-3 300 2 daily, Disp: , Rfl:  .  vitamin C (ASCORBIC ACID) 500 MG tablet, Take 500 mg by mouth daily., Disp: , Rfl:   Past Medical History: Past Medical History:  Diagnosis Date  . Allergic rhinitis   . Arthritis    "all over my body" (10/16/2013)  . Chronic back pain    "neck to lower back" (10/16/2013)  . Compression fracture    S/P L-SPINE; pt does not recall this hx on 10/16/2013  . COPD (chronic obstructive pulmonary disease) (HCC)    FVC .66  . Coronary artery disease   . Depression    "situational since 09/22/2013 OR"  . Encounter for long-term (current) use of other medications   . GERD (gastroesophageal  reflux disease)   . Hemoptysis   . 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 History of blood transfusion 09/2013   "think so; not sure; related to big OR"  . History of surgical fusion joint    DDD C-SPINE  . Hyperlipidemia   . Hypertension   . Leiomyoma OF SKIN   INCREASED RISK RENAL CELL CA  NEEDS CT OF KIDNEYS EVERY 2 YEARS NEXT DUE  02/06/13  . Melanoma in situ ([place])   . Melanoma of back ([place])   . Neuromuscular disorder ([place])   . Osteoarthritis   . Renal calculi   . Renal cell carcinoma ([place])    type 2; papillary  . Tobacco use disorder   . Unspecified vitamin D deficiency     Tobacco Use: Social History   Tobacco Use  Smoking Status Former Smoker  . Packs/day: 0.50  . Years: 40.00  . Pack years: 20.00  . Types: Cigarettes  Smokeless Tobacco Never Used  Tobacco Comment   Pt chewing Nicorette gum currently    Labs: Recent Review Flowsheet Data    Labs for ITP Cardiac and Pulmonary Rehab Latest Ref Rng & Units 04/30/2017 02/11/2018 05/01/2019 05/25/2020 06/02/2020   Cholestrol 100 - 199 mg/dL 176 164 175 121 146   LDLCALC 0 - 99 mg/dL - - - 35 57   LDLDIRECT mg/dL 64.0 60.0 63.0 - -   HDL >39 mg/dL 62.70 66.50 60.60 48 50   Trlycerides 0 - 149 mg/dL 311.0(H) 209.0(H) 241.0(H) 189(H) 244(H)   Hemoglobin A1c 4.6 - 6.5 % - - - - -      Capillary Blood Glucose: No results found for: GLUCAP   Exercise Target Goals: Exercise Program Goal: Individual exercise prescription set using results from initial 6 min walk test and THRR while considering  patient's activity barriers and safety.   Exercise Prescription Goal: Starting with aerobic activity 30 plus minutes a day, 3 days per week for initial exercise prescription. Provide home exercise prescription and guidelines that participant acknowledges understanding prior to discharge.  Activity Barriers & Risk Stratification:  Activity Barriers & Cardiac Risk Stratification - 07/20/20 1212       Activity Barriers & Cardiac Risk Stratification   Activity Barriers Back Problems;Neck/Spine Problems;Joint Problems;Deconditioning;Muscular Weakness;Balance Concerns;Other (comment)    Comments Renal Cell Carcinoma with metastasis to bone    Cardiac Risk Stratification High           6 Minute Walk:  6 Minute Walk    Row Name 07/20/20 1210         6 Minute Walk   Phase Initial     Distance 1400 feet     Walk Time 6  minutes     # of Rest Breaks 0     MPH 2.7     METS 3.5     RPE 11     Perceived Dyspnea  0     VO2 Peak 12.16     Symptoms Yes (comment)     Comments Chronic Left shoulder pain 4/10     Resting HR 68 bpm     Resting BP 104/70     Resting Oxygen Saturation  98 %     Exercise Oxygen Saturation  during 6 min walk 97 %     Max Ex. HR 81 bpm     Max Ex. BP 110/64     2 Minute Post BP 108/70            Oxygen Initial Assessment:   Oxygen Re-Evaluation:   Oxygen Discharge (Final Oxygen Re-Evaluation):   Initial Exercise Prescription:  Initial Exercise Prescription - 07/20/20 1200      Date of Initial Exercise RX and Referring Provider   Date 07/20/20    Referring Provider Jenkins Rouge, MD    Expected Discharge Date 09/18/19      Recumbant Bike   Level 2    Minutes 15    METs 2.3      Track   Laps 12    Minutes 15    METs 2.39      Prescription Details   Frequency (times per week) 3    Duration Progress to 30 minutes of continuous aerobic without signs/symptoms of physical distress      Intensity   THRR 40-80% of Max Heartrate 62-124    Ratings of Perceived Exertion 11-13    Perceived Dyspnea 0-4      Progression   Progression Continue progressive overload as per policy without signs/symptoms or physical distress.      Resistance Training   Training Prescription Yes    Weight 2 lbs    Reps 10-15           Perform Capillary Blood Glucose checks as needed.  Exercise Prescription Changes:  Exercise Prescription Changes    Row  Name 07/26/20 1400 08/04/20 1500 08/13/20 1500 08/30/20 1445 09/13/20 1419     Response to Exercise   Blood Pressure (Admit) 138/80 116/70 120/80 128/82 116/70   Blood Pressure (Exercise) 124/76 130/80 126/80 124/80 128/80   Blood Pressure (Exit) 102/64 118/78 104/70 104/68 110/72   Heart Rate (Admit) -- 80 bpm 79 bpm 95 bpm 88 bpm   Heart Rate (Exercise) -- 91 bpm 96 bpm 99 bpm 99 bpm   Heart Rate (Exit) -- 60 bpm 65 bpm 61 bpm 76 bpm   Rating of Perceived Exertion (Exercise) 12 11 12 12 12    Symptoms None None None None None   Comments Pt's first day of exercise in the CRP2 program Reviewed Home exercise Rx Reviewed METs Reviewed METs and Goals Reviewed METs   Duration Progress to 30 minutes of  aerobic without signs/symptoms of physical distress Continue with 30 min of aerobic exercise without signs/symptoms of physical distress. Continue with 30 min of aerobic exercise without signs/symptoms of physical distress. Continue with 30 min of aerobic exercise without signs/symptoms of physical distress. Continue with 30 min of aerobic exercise without signs/symptoms of physical distress.   Intensity THRR unchanged THRR unchanged THRR unchanged THRR unchanged THRR unchanged     Progression   Progression Continue to progress workloads to maintain intensity without signs/symptoms of physical distress. Continue to progress workloads  to maintain intensity without signs/symptoms of physical distress. Continue to progress workloads to maintain intensity without signs/symptoms of physical distress. Continue to progress workloads to maintain intensity without signs/symptoms of physical distress. Continue to progress workloads to maintain intensity without signs/symptoms of physical distress.   Average METs 2.8 3.3 3.3 2.86 3.6     Resistance Training   Training Prescription Yes No Yes Yes Yes   Weight 2 lbs No weights on Wednesdays 2 lbs 2 lbs 2 lbs   Reps 10-15 -- 10-15 10-15 10-15   Time 10 Minutes --  10 Minutes 10 Minutes 10 Minutes     Interval Training   Interval Training No No No -- No     Recumbant Bike   Level 2 2.5 2.5 2.8 2.8   RPM -- 60 60 60 60   Minutes 15 15 15 15 15    METs 2.7 3.8 3.7 -- 4.3     Track   Laps 16 16 16 16 16    Minutes 15 15 15 15 15    METs 2.86 2.86 2.86 2.86 2.86     Home Exercise Plan   Plans to continue exercise at -- Home (comment) Home (comment) Home (comment) Home (comment)   Frequency -- Add 2 additional days to program exercise sessions. Add 2 additional days to program exercise sessions. Add 2 additional days to program exercise sessions. Add 2 additional days to program exercise sessions.   Initial Home Exercises Provided -- 08/04/20 08/04/20 08/04/20 08/04/20          Exercise Comments:  Exercise Comments    Row Name 07/26/20 1454 08/04/20 1500 08/13/20 1505 08/30/20 1500 09/13/20 1422   Exercise Comments Pt's first day in the CRP2 program. Pt tolerated session well. Pt does have limited ROM in the left shoulder which limits the use of free weights and some upperbody stretching. Reviewed home exercise Rx with patient today. Pt has been doing some walking at home and will try to incorporate walking at least 2 days a week for 30 minutes. Reviewed METs. Pt contiunes to make porgress. Pt voices feeling stronger. Pt is walking 4x/week for 20-30 minutes on off days from the CRP2 program. Reviewed MEts and goals. Pt has made progss in hsi exercise workloads here in the CRP2 program, Voics feels stronger. Reviewed METs. Pt continue to make progress in the CRP2 program and voices he is going to increase the durtaion of his walks at home to 30-45 minutes.          Exercise Goals and Review:  Exercise Goals    Row Name 07/20/20 1216             Exercise Goals   Increase Physical Activity Yes       Intervention Provide advice, education, support and counseling about physical activity/exercise needs.;Develop an individualized exercise  prescription for aerobic and resistive training based on initial evaluation findings, risk stratification, comorbidities and participant's personal goals.       Expected Outcomes Short Term: Attend rehab on a regular basis to increase amount of physical activity.;Long Term: Add in home exercise to make exercise part of routine and to increase amount of physical activity.;Long Term: Exercising regularly at least 3-5 days a week.       Increase Strength and Stamina Yes       Intervention Provide advice, education, support and counseling about physical activity/exercise needs.;Develop an individualized exercise prescription for aerobic and resistive training based on initial evaluation findings, risk stratification, comorbidities and  participant's personal goals.       Expected Outcomes Short Term: Increase workloads from initial exercise prescription for resistance, speed, and METs.;Short Term: Perform resistance training exercises routinely during rehab and add in resistance training at home;Long Term: Improve cardiorespiratory fitness, muscular endurance and strength as measured by increased METs and functional capacity (6MWT)       Able to understand and use rate of perceived exertion (RPE) scale Yes       Intervention Provide education and explanation on how to use RPE scale       Expected Outcomes Short Term: Able to use RPE daily in rehab to express subjective intensity level;Long Term:  Able to use RPE to guide intensity level when exercising independently       Knowledge and understanding of Target Heart Rate Range (THRR) Yes       Intervention Provide education and explanation of THRR including how the numbers were predicted and where they are located for reference       Expected Outcomes Short Term: Able to state/look up THRR;Short Term: Able to use daily as guideline for intensity in rehab;Long Term: Able to use THRR to govern intensity when exercising independently       Understanding of  Exercise Prescription Yes       Intervention Provide education, explanation, and written materials on patient's individual exercise prescription       Expected Outcomes Short Term: Able to explain program exercise prescription;Long Term: Able to explain home exercise prescription to exercise independently              Exercise Goals Re-Evaluation :  Exercise Goals Re-Evaluation    Row Name 07/26/20 1453 08/04/20 1500 08/30/20 1500         Exercise Goal Re-Evaluation   Exercise Goals Review Increase Physical Activity;Increase Strength and Stamina;Able to understand and use rate of perceived exertion (RPE) scale;Knowledge and understanding of Target Heart Rate Range (THRR);Understanding of Exercise Prescription Increase Physical Activity;Increase Strength and Stamina;Able to check pulse independently;Understanding of Exercise Prescription;Able to understand and use rate of perceived exertion (RPE) scale;Knowledge and understanding of Target Heart Rate Range (THRR) Increase Physical Activity;Increase Strength and Stamina;Able to understand and use rate of perceived exertion (RPE) scale;Knowledge and understanding of Target Heart Rate Range (THRR);Understanding of Exercise Prescription     Comments Pt's first day in the CRP2 program. Pt understands the exercise Rx, THRR, and RPE sclae. Reviewed home exercise Rx with patient today. Pt will walk 2-3x/week for 30 minutes. Pt verbalized understanding of the Home exercise Rx and was provided a copy. Reviewed METs and Goals. Pt returning from trip to [place]. Pt voices feeling he has made progress here but voices concern of his CA progression.     Expected Outcomes Will continue to monitor patient and progress as tolerated. Pt will walk at home 2-3x/week for 30 minutes to increase CV fitness level. Will continue to monitor and progress as tolertaed.             Discharge Exercise Prescription (Final Exercise Prescription Changes):  Exercise Prescription  Changes - 09/13/20 1419      Response to Exercise   Blood Pressure (Admit) 116/70    Blood Pressure (Exercise) 128/80    Blood Pressure (Exit) 110/72    Heart Rate (Admit) 88 bpm    Heart Rate (Exercise) 99 bpm    Heart Rate (Exit) 76 bpm    Rating of Perceived Exertion (Exercise) 12    Symptoms None  Comments Reviewed METs    Duration Continue with 30 min of aerobic exercise without signs/symptoms of physical distress.    Intensity THRR unchanged      Progression   Progression Continue to progress workloads to maintain intensity without signs/symptoms of physical distress.    Average METs 3.6      Resistance Training   Training Prescription Yes    Weight 2 lbs    Reps 10-15    Time 10 Minutes      Interval Training   Interval Training No      Recumbant Bike   Level 2.8    RPM 60    Minutes 15    METs 4.3      Track   Laps 16    Minutes 15    METs 2.86      Home Exercise Plan   Plans to continue exercise at Home (comment)    Frequency Add 2 additional days to program exercise sessions.    Initial Home Exercises Provided 08/04/20           Nutrition:  Target Goals: Understanding of nutrition guidelines, daily intake of sodium 1500mg , cholesterol 200mg , calories 30% from fat and 7% or less from saturated fats, daily to have 5 or more servings of fruits and vegetables.  Biometrics:  Pre Biometrics - 07/20/20 1000      Pre Biometrics   Waist Circumference 38 inches    Hip Circumference 40.5 inches    Waist to Hip Ratio 0.94 %    Triceps Skinfold 12 mm    % Body Fat 23.3 %    Grip Strength 40 kg    Flexibility --   Not done due to hernia, wearing truss   Single Leg Stand 5.5 seconds            Nutrition Therapy Plan and Nutrition Goals:  Nutrition Therapy & Goals - 09/10/20 1430      Nutrition Therapy   Diet High calorie      Personal Nutrition Goals   Nutrition Goal Pt to gain 0.5 lbs per week during cardiac rehab    Personal Goal #2 Pt to  incorporate more healthy fats into diet      Intervention Plan   Intervention Prescribe, educate and counsel regarding individualized specific dietary modifications aiming towards targeted core components such as weight, hypertension, lipid management, diabetes, heart failure and other comorbidities.    Expected Outcomes Short Term Goal: Understand basic principles of dietary content, such as calories, fat, sodium, cholesterol and nutrients.           Nutrition Assessments:  MEDIFICTS Score Key:  ?70 Need to make dietary changes   40-70 Heart Healthy Diet  ? 40 Therapeutic Level Cholesterol Diet  Flowsheet Row CARDIAC REHAB PHASE II EXERCISE from 09/10/2020 in [place]  Picture Your Plate Total Score on Admission 71     Picture Your Plate Scores:  <34 Unhealthy dietary pattern with much room for improvement.  41-50 Dietary pattern unlikely to meet recommendations for good health and room for improvement.  51-60 More healthful dietary pattern, with some room for improvement.   >60 Healthy dietary pattern, although there may be some specific behaviors that could be improved.    Nutrition Goals Re-Evaluation:  Nutrition Goals Re-Evaluation    [place] Name 09/10/20 1431             Goals   Current Weight 168 lb 10.4 oz (76.5 kg)  Nutrition Goal Pt to gain 0.5 lbs per week during cardiac rehab               Personal Goal #2 Re-Evaluation   Personal Goal #2 Pt to incorporate more healthy fats into diet              Nutrition Goals Discharge (Final Nutrition Goals Re-Evaluation):  Nutrition Goals Re-Evaluation - 09/10/20 1431      Goals   Current Weight 168 lb 10.4 oz (76.5 kg)    Nutrition Goal Pt to gain 0.5 lbs per week during cardiac rehab      Personal Goal #2 Re-Evaluation   Personal Goal #2 Pt to incorporate more healthy fats into diet           Psychosocial: Target Goals: Acknowledge presence or absence of  significant depression and/or stress, maximize coping skills, provide positive support system. Participant is able to verbalize types and ability to use techniques and skills needed for reducing stress and depression.  Initial Review & Psychosocial Screening:  Initial Psych Review & Screening - 07/20/20 1439      Initial Review   Current issues with History of Depression;Current Stress Concerns;Current Anxiety/Panic    Source of Stress Concerns Chronic Illness;Unable to participate in former interests or hobbies;Unable to perform yard/household activities    Comments Douglas [name] has Renal cell carcinoma and is getting treatment at the [facility] in [place]? Yes   Douglas [name] has his wife for support     Barriers   Psychosocial barriers to participate in program The patient should benefit from training in stress management and relaxation.      Screening Interventions   Interventions Encouraged to exercise;To provide support and resources with identified psychosocial needs;Provide feedback about the scores to participant    Expected Outcomes Long Term Goal: Stressors or current issues are controlled or eliminated.;Long Term goal: The participant improves quality of Life and PHQ9 Scores as seen by post scores and/or verbalization of changes;Short Term goal: Identification and review with participant of any Quality of Life or Depression concerns found by scoring the questionnaire.           Quality of Life Scores:  Quality of Life - 07/20/20 1207      Quality of Life   Select Quality of Life      Quality of Life Scores   Health/Function Pre 20.03 %    Socioeconomic Pre 22.57 %    Psych/Spiritual Pre 20.14 %    Family Pre 24.5 %    GLOBAL Pre 21.03 %          Scores of 19 and below usually indicate a poorer quality of life in these areas.  A difference of  2-3 points is a clinically meaningful difference.  A difference of 2-3 points in the total score of  the Quality of Life Index has been associated with significant improvement in overall quality of life, self-image, physical symptoms, and general health in studies assessing change in quality of life.  PHQ-9: Recent Review Flowsheet Data    Depression screen [facility] 2/9 07/20/2020 06/03/2016 03/06/2016 01/17/2016 12/20/2015   Decreased Interest 0 0 0 0 0   Down, Depressed, Hopeless 0 0 0 0 0   PHQ - 2 Score 0 0 0 0 0     Interpretation of Total Score  Total Score Depression Severity:  1-4 = Minimal depression, 5-9 = Mild depression, 10-14 = Moderate  depression, 15-19 = Moderately severe depression, 20-27 = Severe depression   Psychosocial Evaluation and Intervention:   Psychosocial Re-Evaluation:  Psychosocial Re-Evaluation    [place] Name 08/17/20 1717 09/14/20 1642           Psychosocial Re-Evaluation   Current issues with Current Depression;Current Anxiety/Panic;Current Stress Concerns Current Depression;Current Anxiety/Panic;Current Stress Concerns      Comments Douglas [name] has not voiced any increased stressors. Douglas [name] recently lost his dog. Douglas [name] continues to have stress regarding his health and wife      Expected Outcomes patient will have decreased stress upon completion of phase 2 cardiac rehab patient will have decreased stress upon completion of phase 2 cardiac rehab      Continue Psychosocial Services  Follow up required by staff Follow up required by staff      Comments -- Will continue to offer support as needed             Initial Review   Source of Stress Concerns Chronic Illness;Unable to perform yard/household activities Chronic Illness;Unable to perform yard/household activities             Psychosocial Discharge (Final Psychosocial Re-Evaluation):  Psychosocial Re-Evaluation - 09/14/20 1642      Psychosocial Re-Evaluation   Current issues with Current Depression;Current Anxiety/Panic;Current Stress Concerns    Comments Douglas [name] continues to have stress regarding his health  and wife    Expected Outcomes patient will have decreased stress upon completion of phase 2 cardiac rehab    Continue Psychosocial Services  Follow up required by staff    Comments Will continue to offer support as needed      Initial Review   Source of Stress Concerns Chronic Illness;Unable to perform yard/household activities           Vocational Rehabilitation: Provide vocational rehab assistance to qualifying candidates.   Vocational Rehab Evaluation & Intervention:   Education: Education Goals: Education classes will be provided on a weekly basis, covering required topics. Participant will state understanding/return demonstration of topics presented.  Learning Barriers/Preferences:  Learning Barriers/Preferences - 07/20/20 1207      Learning Barriers/Preferences   Learning Barriers Sight   wears glasses   Learning Preferences None           Education Topics: Hypertension, Hypertension Reduction -Define heart disease and high blood pressure. Discus how high blood pressure affects the body and ways to reduce high blood pressure.   Exercise and Your Heart -Discuss why it is important to exercise, the FITT principles of exercise, normal and abnormal responses to exercise, and how to exercise safely.   Angina -Discuss definition of angina, causes of angina, treatment of angina, and how to decrease risk of having angina.   Cardiac Medications -Review what the following cardiac medications are used for, how they affect the body, and side effects that may occur when taking the medications.  Medications include Aspirin, Beta blockers, calcium channel blockers, ACE Inhibitors, angiotensin receptor blockers, diuretics, digoxin, and antihyperlipidemics.   Congestive Heart Failure -Discuss the definition of CHF, how to live with CHF, the signs and symptoms of CHF, and how keep track of weight and sodium intake.   Heart Disease and Intimacy -Discus the effect sexual  activity has on the heart, how changes occur during intimacy as we age, and safety during sexual activity.   Smoking Cessation / COPD -Discuss different methods to quit smoking, the health benefits of quitting smoking, and the definition of COPD.   Nutrition I: Fats -Discuss the  types of cholesterol, what cholesterol does to the heart, and how cholesterol levels can be controlled.   Nutrition II: Labels -Discuss the different components of food labels and how to read food label   Heart Parts/Heart Disease and PAD -Discuss the anatomy of the heart, the pathway of blood circulation through the heart, and these are affected by heart disease.   Stress I: Signs and Symptoms -Discuss the causes of stress, how stress may lead to anxiety and depression, and ways to limit stress.   Stress II: Relaxation -Discuss different types of relaxation techniques to limit stress.   Warning Signs of Stroke / TIA -Discuss definition of a stroke, what the signs and symptoms are of a stroke, and how to identify when someone is having stroke.   Knowledge Questionnaire Score:  Knowledge Questionnaire Score - 07/20/20 1209      Knowledge Questionnaire Score   Pre Score 21/24           Core Components/Risk Factors/Patient Goals at Admission:  Personal Goals and Risk Factors at Admission - 07/20/20 1445      Core Components/Risk Factors/Patient Goals on Admission    Weight Management Weight Maintenance;Yes    Intervention Weight Management: Provide education and appropriate resources to help participant work on and attain dietary goals.    Expected Outcomes Weight Maintenance: Understanding of the daily nutrition guidelines, which includes 25-35% calories from fat, 7% or less cal from saturated fats, less than 200mg  cholesterol, less than 1.5gm of sodium, & 5 or more servings of fruits and vegetables daily;Understanding of distribution of calorie intake throughout the day with the consumption of  4-5 meals/snacks;Understanding recommendations for meals to include 15-35% energy as protein, 25-35% energy from fat, 35-60% energy from carbohydrates, less than 200mg  of dietary cholesterol, 20-35 gm of total fiber daily    Hypertension Yes    Intervention Provide education on lifestyle modifcations including regular physical activity/exercise, weight management, moderate sodium restriction and increased consumption of fresh fruit, vegetables, and low fat dairy, alcohol moderation, and smoking cessation.;Monitor prescription use compliance.    Expected Outcomes Short Term: Continued assessment and intervention until BP is < 140/6mm HG in hypertensive participants. < 130/10mm HG in hypertensive participants with diabetes, heart failure or chronic kidney disease.;Long Term: Maintenance of blood pressure at goal levels.    Lipids Yes    Intervention Provide education and support for participant on nutrition & aerobic/resistive exercise along with prescribed medications to achieve LDL 70mg , HDL >40mg .    Expected Outcomes Short Term: Participant states understanding of desired cholesterol values and is compliant with medications prescribed. Participant is following exercise prescription and nutrition guidelines.;Long Term: Cholesterol controlled with medications as prescribed, with individualized exercise RX and with personalized nutrition plan. Value goals: LDL < 70mg , HDL > 40 mg.    Stress Yes    Intervention Refer participants experiencing significant psychosocial distress to appropriate mental health specialists for further evaluation and treatment. When possible, include family members and significant others in education/counseling sessions.    Expected Outcomes Short Term: Participant demonstrates changes in health-related behavior, relaxation and other stress management skills, ability to obtain effective social support, and compliance with psychotropic medications if prescribed.;Long Term: Emotional  wellbeing is indicated by absence of clinically significant psychosocial distress or social isolation.           Core Components/Risk Factors/Patient Goals Review:   Goals and Risk Factor Review    Row Name 07/27/20 1334 08/17/20 1719 09/14/20 1643  Core Components/Risk Factors/Patient Goals Review   Personal Goals Review Weight Management/Obesity;Stress;Hypertension;Lipids Weight Management/Obesity;Stress;Hypertension;Lipids Weight Management/Obesity;Stress;Hypertension;Lipids     Review Douglas [name] started exercise on 07/26/20. Douglas [name]'s vital signs were stable. Douglas [name] did well with exercise Douglas [name] has been doing well with exercise. Douglas [name]'s vital signs have been stable Douglas [name] has been doing well with exercise. Douglas [name]'s vital signs have been stable. Douglas [name] will complete cardiac rehab on 09/17/20     Expected Outcomes Douglas [name] will continue to participate in phase 2 cardiac rehab for exercise, nutrition and lifestyle modifications Douglas [name] will continue to participate in phase 2 cardiac rehab for exercise, nutrition and lifestyle modifications Douglas [name] will continue to participate in phase 2 cardiac rehab for exercise, nutrition and lifestyle modifications            Core Components/Risk Factors/Patient Goals at Discharge (Final Review):   Goals and Risk Factor Review - 09/14/20 1643      Core Components/Risk Factors/Patient Goals Review   Personal Goals Review Weight Management/Obesity;Stress;Hypertension;Lipids    Review Douglas [name] has been doing well with exercise. Kaelum's vital signs have been stable. Douglas [name] will complete cardiac rehab on 09/17/20    Expected Outcomes Douglas [name] will continue to participate in phase 2 cardiac rehab for exercise, nutrition and lifestyle modifications           ITP Comments:  ITP Comments    Row Name 07/20/20 1425 07/26/20 1656 08/17/20 1716 09/14/20 1641     ITP Comments Dr Fransico Him MD, Medical Director 30 Day ITP Review. Douglas [name] started exercise on 07/26/20 and did  well with exercise 30 Day ITP Review. Douglas [name] has good attendance and partcipation in phase 2 cardiac rehab 30 Day ITP Review. Douglas [name] has good attendance and partcipation in phase 2 cardiac rehab. Douglas [name] will complete cardiac rehab on 09/17/20           Comments: See ITP comments.Barnet Pall, RN,BSN 09/14/2020 4:46 PM

## 2020-09-15 ENCOUNTER — Encounter (HOSPITAL_COMMUNITY)
Admission: RE | Admit: 2020-09-15 | Discharge: 2020-09-15 | Disposition: A | Discharge status: Home / Self Care | Payer: Medicare HMO | Source: Ambulatory Visit | Attending: Cardiovascular Disease | Admitting: Cardiovascular Disease

## 2020-09-15 ENCOUNTER — Other Ambulatory Visit: Payer: Self-pay

## 2020-09-15 DIAGNOSIS — Z955 Presence of coronary angioplasty implant and graft: Secondary | ICD-10-CM | POA: Diagnosis not present

## 2020-09-15 DIAGNOSIS — I2102 ST elevation (STEMI) myocardial infarction involving left anterior descending coronary artery: Secondary | ICD-10-CM

## 2020-09-15 DIAGNOSIS — I213 ST elevation (STEMI) myocardial infarction of unspecified site: Secondary | ICD-10-CM | POA: Diagnosis not present

## 2020-09-15 NOTE — Progress Notes (Signed)
Discharge Progress Report  Patient Details  Name: Douglas [name] MRN: [id number] Date of Birth: 1955/06/30 Referring Provider:   Flowsheet Row CARDIAC REHAB PHASE II ORIENTATION from 07/20/2020 in [place]  Referring Provider Jenkins Rouge, MD       Number of Visits: 18  Reason for Discharge:  Patient reached a stable level of exercise. Patient independent in their exercise. Patient has met program and personal goals.  Smoking History:  Social History   Tobacco Use  Smoking Status Former Smoker  . Packs/day: 0.50  . Years: 40.00  . Pack years: 20.00  . Types: Cigarettes  Smokeless Tobacco Never Used  Tobacco Comment   Pt chewing Nicorette gum currently    Diagnosis:  05/24/20 STEMI   05/24/20 S/P DES LAD  ADL [facility]:   Initial Exercise Prescription:  Initial Exercise Prescription - 07/20/20 1200      Date of Initial Exercise RX and Referring Provider   Date 07/20/20    Referring Provider Jenkins Rouge, MD    Expected Discharge Date 09/18/19      Recumbant Bike   Level 2    Minutes 15    METs 2.3      Track   Laps 12    Minutes 15    METs 2.39      Prescription Details   Frequency (times per week) 3    Duration Progress to 30 minutes of continuous aerobic without signs/symptoms of physical distress      Intensity   THRR 40-80% of Max Heartrate 62-124    Ratings of Perceived Exertion 11-13    Perceived Dyspnea 0-4      Progression   Progression Continue progressive overload as per policy without signs/symptoms or physical distress.      Resistance Training   Training Prescription Yes    Weight 2 lbs    Reps 10-15           Discharge Exercise Prescription (Final Exercise Prescription Changes):  Exercise Prescription Changes - 09/20/20 1430      Response to Exercise   Blood Pressure (Admit) 118/78    Blood Pressure (Exercise) 128/78    Blood Pressure (Exit) 98/64    Heart Rate (Admit) 83 bpm    Heart Rate  (Exercise) 94 bpm    Heart Rate (Exit) 62 bpm    Rating of Perceived Exertion (Exercise) 11    Symptoms None    Comments Pt graduated from the CRP2 program today    Duration Continue with 30 min of aerobic exercise without signs/symptoms of physical distress.    Intensity THRR unchanged      Progression   Progression Continue to progress workloads to maintain intensity without signs/symptoms of physical distress.    Average METs 3.5      Resistance Training   Training Prescription Yes    Weight 2 lbs    Reps 10-15    Time 10 Minutes      Interval Training   Interval Training No      Recumbant Bike   Level 2.8    RPM 60    Minutes 15    METs 4.2      Track   Laps 16    Minutes 15    METs 2.86      Home Exercise Plan   Plans to continue exercise at Home (comment)    Frequency Add 2 additional days to program exercise sessions.    Initial Home Exercises Provided 08/04/20  Functional Capacity:  6 Minute Walk    Row Name 07/20/20 1210 09/20/20 1330 09/21/20 0809     6 Minute Walk   Phase Initial Discharge  Walk test was performed 09/15/20 @ 1:30 pm --   Distance 1400 feet 1691 feet --   Distance % Change -- 20.8 % --   Distance Feet Change -- 291 ft --   Walk Time 6 minutes 6 minutes --   # of Rest Breaks 0 0 --   MPH 2.7 3.2 --   METS 3.5 4.08 --   RPE 11 11 --   Perceived Dyspnea  0 0 --   VO2 Peak 12.16 14.3 --   Symptoms Yes (comment) No --   Comments Chronic Left shoulder pain 4/10 -- --   Resting HR 68 bpm 73 bpm --   Resting BP 104/70 112/66 --   Resting Oxygen Saturation  98 % -- --   Exercise Oxygen Saturation  during 6 min walk 97 % 98 % --   Max Ex. HR 81 bpm 87 bpm --   Max Ex. BP 110/64 126/70 --   2 Minute Post BP 108/70 -- --          Psychological, QOL, Others - Outcomes: PHQ 2/9: Depression screen [facility] 2/9 09/15/2020 07/20/2020 06/03/2016 03/06/2016 01/17/2016  Decreased Interest 0 0 0 0 0  Down, Depressed, Hopeless 1 0 0 0 0   PHQ - 2 Score 1 0 0 0 0  Some recent data might be hidden    Quality of Life:  Quality of Life - 09/20/20 1530      Quality of Life Scores   Health/Function Post 18 %    Socioeconomic Post 24.14 %    Psych/Spiritual Post 17.36 %    Family Post 27 %    GLOBAL Post 20.26 %           Personal Goals: Goals established at orientation with interventions provided to work toward goal.  Personal Goals and Risk Factors at Admission - 07/20/20 1445      Core Components/Risk Factors/Patient Goals on Admission    Weight Management Weight Maintenance;Yes    Intervention Weight Management: Provide education and appropriate resources to help participant work on and attain dietary goals.    Expected Outcomes Weight Maintenance: Understanding of the daily nutrition guidelines, which includes 25-35% calories from fat, 7% or less cal from saturated fats, less than 278m cholesterol, less than 1.5gm of sodium, & 5 or more servings of fruits and vegetables daily;Understanding of distribution of calorie intake throughout the day with the consumption of 4-5 meals/snacks;Understanding recommendations for meals to include 15-35% energy as protein, 25-35% energy from fat, 35-60% energy from carbohydrates, less than 2094mof dietary cholesterol, 20-35 gm of total fiber daily    Hypertension Yes    Intervention Provide education on lifestyle modifcations including regular physical activity/exercise, weight management, moderate sodium restriction and increased consumption of fresh fruit, vegetables, and low fat dairy, alcohol moderation, and smoking cessation.;Monitor prescription use compliance.    Expected Outcomes Short Term: Continued assessment and intervention until BP is < 140/9015mG in hypertensive participants. < 130/73m59m in hypertensive participants with diabetes, heart failure or chronic kidney disease.;Long Term: Maintenance of blood pressure at goal levels.    Lipids Yes    Intervention Provide  education and support for participant on nutrition & aerobic/resistive exercise along with prescribed medications to achieve LDL <70mg5mL >40mg.10mExpected Outcomes Short Term: Participant  states understanding of desired cholesterol values and is compliant with medications prescribed. Participant is following exercise prescription and nutrition guidelines.;Long Term: Cholesterol controlled with medications as prescribed, with individualized exercise RX and with personalized nutrition plan. Value goals: LDL < 79m, HDL > 40 mg.    Stress Yes    Intervention Refer participants experiencing significant psychosocial distress to appropriate mental health specialists for further evaluation and treatment. When possible, include family members and significant others in education/counseling sessions.    Expected Outcomes Short Term: Participant demonstrates changes in health-related behavior, relaxation and other stress management skills, ability to obtain effective social support, and compliance with psychotropic medications if prescribed.;Long Term: Emotional wellbeing is indicated by absence of clinically significant psychosocial distress or social isolation.            Personal Goals Discharge:  Goals and Risk Factor Review    Row Name 07/27/20 1334 08/17/20 1719 09/14/20 1643         Core Components/Risk Factors/Patient Goals Review   Personal Goals Review Weight Management/Obesity;Stress;Hypertension;Lipids Weight Management/Obesity;Stress;Hypertension;Lipids Weight Management/Obesity;Stress;Hypertension;Lipids     Review SRichardson Landrystarted exercise on 07/26/20. [name]'s vital signs were stable. SRichardson Landrydid well with exercise SRichardson Landryhas been doing well with exercise. Livan's vital signs have been stable SRichardson Landryhas been doing well with exercise. Ithan's vital signs have been stable. SGussiewill complete cardiac rehab on 09/17/20     Expected Outcomes SRichardson Landrywill continue to participate in phase 2 cardiac rehab  for exercise, nutrition and lifestyle modifications SRichardson Landrywill continue to participate in phase 2 cardiac rehab for exercise, nutrition and lifestyle modifications SRichardson Landrywill continue to participate in phase 2 cardiac rehab for exercise, nutrition and lifestyle modifications            Exercise Goals and Review:  Exercise Goals    Row Name 07/20/20 1216             Exercise Goals   Increase Physical Activity Yes       Intervention Provide advice, education, support and counseling about physical activity/exercise needs.;Develop an individualized exercise prescription for aerobic and resistive training based on initial evaluation findings, risk stratification, comorbidities and participant's personal goals.       Expected Outcomes Short Term: Attend rehab on a regular basis to increase amount of physical activity.;Long Term: Add in home exercise to make exercise part of routine and to increase amount of physical activity.;Long Term: Exercising regularly at least 3-5 days a week.       Increase Strength and Stamina Yes       Intervention Provide advice, education, support and counseling about physical activity/exercise needs.;Develop an individualized exercise prescription for aerobic and resistive training based on initial evaluation findings, risk stratification, comorbidities and participant's personal goals.       Expected Outcomes Short Term: Increase workloads from initial exercise prescription for resistance, speed, and METs.;Short Term: Perform resistance training exercises routinely during rehab and add in resistance training at home;Long Term: Improve cardiorespiratory fitness, muscular endurance and strength as measured by increased METs and functional capacity (6MWT)       Able to understand and use rate of perceived exertion (RPE) scale Yes       Intervention Provide education and explanation on how to use RPE scale       Expected Outcomes Short Term: Able to use RPE daily in rehab  to express subjective intensity level;Long Term:  Able to use RPE to guide intensity level when exercising independently  Knowledge and understanding of Target Heart Rate Range (THRR) Yes       Intervention Provide education and explanation of THRR including how the numbers were predicted and where they are located for reference       Expected Outcomes Short Term: Able to state/look up THRR;Short Term: Able to use daily as guideline for intensity in rehab;Long Term: Able to use THRR to govern intensity when exercising independently       Understanding of Exercise Prescription Yes       Intervention Provide education, explanation, and written materials on patient's individual exercise prescription       Expected Outcomes Short Term: Able to explain program exercise prescription;Long Term: Able to explain home exercise prescription to exercise independently              Exercise Goals Re-Evaluation:  Exercise Goals Re-Evaluation    Row Name 07/26/20 1453 08/04/20 1500 08/30/20 1500 09/20/20 1430       Exercise Goal Re-Evaluation   Exercise Goals Review Increase Physical Activity;Increase Strength and Stamina;Able to understand and use rate of perceived exertion (RPE) scale;Knowledge and understanding of Target Heart Rate Range (THRR);Understanding of Exercise Prescription Increase Physical Activity;Increase Strength and Stamina;Able to check pulse independently;Understanding of Exercise Prescription;Able to understand and use rate of perceived exertion (RPE) scale;Knowledge and understanding of Target Heart Rate Range (THRR) Increase Physical Activity;Increase Strength and Stamina;Able to understand and use rate of perceived exertion (RPE) scale;Knowledge and understanding of Target Heart Rate Range (THRR);Understanding of Exercise Prescription Increase Physical Activity;Increase Strength and Stamina;Able to understand and use rate of perceived exertion (RPE) scale;Knowledge and understanding of  Target Heart Rate Range (THRR);Able to check pulse independently;Understanding of Exercise Prescription    Comments Pt's first day in the CRP2 program. Pt understands the exercise Rx, THRR, and RPE sclae. Reviewed home exercise Rx with patient today. Pt will walk 2-3x/week for 30 minutes. Pt verbalized understanding of the Home exercise Rx and was provided a copy. Reviewed METs and Goals. Pt returning from trip to [place]. Pt voices feeling he has made progress here but voices concern of his CA progression. Pt graduated from the [place] program today. Pt had an average MET level of 3.5. Pt plans to continue his exericse by walking at home and join [employer]. Pt's exercise progress and frequency will be based upon how he is feeling physically as he starts chemotherapy tomorrrow.    Expected Outcomes Will continue to monitor patient and progress as tolerated. Pt will walk at home 2-3x/week for 30 minutes to increase CV fitness level. Will continue to monitor and progress as tolertaed. Pt will continue to exercise at home/gym on his own.           Nutrition & Weight - Outcomes:  Pre Biometrics - 07/20/20 1000      Pre Biometrics   Waist Circumference 38 inches    Hip Circumference 40.5 inches    Waist to Hip Ratio 0.94 %    Triceps Skinfold 12 mm    % Body Fat 23.3 %    Grip Strength 40 kg    Flexibility --   Not done due to hernia, wearing truss   Single Leg Stand 5.5 seconds           Post Biometrics - 09/20/20 1330       Post  Biometrics   Height 6' (1.829 m)    Weight 76.4 kg    Waist Circumference 37 inches    Hip Circumference 41  inches    Waist to Hip Ratio 0.9 %    BMI (Calculated) 22.84    Triceps Skinfold 12 mm    % Body Fat 23.1 %    Grip Strength 43 kg    Flexibility --   Not perfomed   Single Leg Stand 7.98 seconds           Nutrition:  Nutrition Therapy & Goals - 09/10/20 1430      Nutrition Therapy   Diet High calorie      Personal Nutrition Goals    Nutrition Goal Pt to gain 0.5 lbs per week during cardiac rehab    Personal Goal #2 Pt to incorporate more healthy fats into diet      Intervention Plan   Intervention Prescribe, educate and counsel regarding individualized specific dietary modifications aiming towards targeted core components such as weight, hypertension, lipid management, diabetes, heart failure and other comorbidities.    Expected Outcomes Short Term Goal: Understand basic principles of dietary content, such as calories, fat, sodium, cholesterol and nutrients.           Nutrition Discharge:   Education Questionnaire Score:  Knowledge Questionnaire Score - 09/20/20 1330      Knowledge Questionnaire Score   Post Score 22/24           Goals reviewed with patient; copy given to patient.Pt graduated from cardiac rehab program on 09/17/20 with completion of  exercise sessions in Phase II. Pt maintained good attendance and progressed nicely during his participation in rehab as evidenced by increased MET level.   Medication list reconciled. Repeat  PHQ score-0.  Pt has made significant lifestyle changes and should be commended for his success. Pt feels he has achieved his goals during cardiac rehab.   Pt plans to continue exercise by walking at home. [name] says that he may participate  In [place] depending on how he does with his chemotherapy treatments. [name] increased his distance on his post exercise walk test by 291 feet we are proud of Lemarcus's progress!Barnet Pall, RN,BSN 10/07/2020 2:35 PM

## 2020-09-16 DIAGNOSIS — Z711 Person with feared health complaint in whom no diagnosis is made: Secondary | ICD-10-CM | POA: Diagnosis not present

## 2020-09-17 ENCOUNTER — Other Ambulatory Visit: Payer: Self-pay

## 2020-09-17 ENCOUNTER — Encounter (HOSPITAL_COMMUNITY)
Admission: RE | Admit: 2020-09-17 | Discharge: 2020-09-17 | Disposition: A | Discharge status: Home / Self Care | Payer: Medicare HMO | Source: Ambulatory Visit | Attending: Cardiovascular Disease | Admitting: Cardiovascular Disease

## 2020-09-17 DIAGNOSIS — Z955 Presence of coronary angioplasty implant and graft: Secondary | ICD-10-CM | POA: Diagnosis not present

## 2020-09-17 DIAGNOSIS — I2102 ST elevation (STEMI) myocardial infarction involving left anterior descending coronary artery: Secondary | ICD-10-CM | POA: Diagnosis not present

## 2020-09-20 ENCOUNTER — Other Ambulatory Visit: Payer: Self-pay

## 2020-09-20 ENCOUNTER — Encounter: Payer: Self-pay | Admitting: Family Medicine

## 2020-09-20 ENCOUNTER — Encounter (HOSPITAL_COMMUNITY)
Admission: RE | Admit: 2020-09-20 | Discharge: 2020-09-20 | Disposition: A | Discharge status: Home / Self Care | Payer: Medicare HMO | Source: Ambulatory Visit | Attending: Cardiovascular Disease | Admitting: Cardiovascular Disease

## 2020-09-20 VITALS — Ht 72.0 in | Wt 168.4 lb

## 2020-09-20 DIAGNOSIS — Z955 Presence of coronary angioplasty implant and graft: Secondary | ICD-10-CM | POA: Diagnosis not present

## 2020-09-20 DIAGNOSIS — C641 Malignant neoplasm of right kidney, except renal pelvis: Secondary | ICD-10-CM

## 2020-09-20 DIAGNOSIS — I2102 ST elevation (STEMI) myocardial infarction involving left anterior descending coronary artery: Secondary | ICD-10-CM

## 2020-09-20 DIAGNOSIS — G893 Neoplasm related pain (acute) (chronic): Secondary | ICD-10-CM

## 2020-09-21 DIAGNOSIS — R809 Proteinuria, unspecified: Secondary | ICD-10-CM | POA: Diagnosis not present

## 2020-09-21 DIAGNOSIS — Z5112 Encounter for antineoplastic immunotherapy: Secondary | ICD-10-CM | POA: Diagnosis not present

## 2020-09-21 DIAGNOSIS — Z1509 Genetic susceptibility to other malignant neoplasm: Secondary | ICD-10-CM | POA: Diagnosis not present

## 2020-09-21 DIAGNOSIS — Z79899 Other long term (current) drug therapy: Secondary | ICD-10-CM | POA: Insufficient documentation

## 2020-09-21 DIAGNOSIS — D580 Hereditary spherocytosis: Secondary | ICD-10-CM | POA: Diagnosis not present

## 2020-09-21 DIAGNOSIS — Z7983 Long term (current) use of bisphosphonates: Secondary | ICD-10-CM | POA: Diagnosis not present

## 2020-09-21 DIAGNOSIS — C649 Malignant neoplasm of unspecified kidney, except renal pelvis: Secondary | ICD-10-CM | POA: Diagnosis not present

## 2020-09-21 DIAGNOSIS — C7951 Secondary malignant neoplasm of bone: Secondary | ICD-10-CM | POA: Diagnosis not present

## 2020-09-21 DIAGNOSIS — I252 Old myocardial infarction: Secondary | ICD-10-CM | POA: Diagnosis not present

## 2020-09-21 DIAGNOSIS — K4090 Unilateral inguinal hernia, without obstruction or gangrene, not specified as recurrent: Secondary | ICD-10-CM | POA: Diagnosis not present

## 2020-09-21 NOTE — Progress Notes (Signed)
Post-biometrics were obtained 09/15/20.

## 2020-09-22 ENCOUNTER — Telehealth: Payer: Self-pay | Admitting: Cardiovascular Disease

## 2020-09-22 DIAGNOSIS — I514 Myocarditis, unspecified: Secondary | ICD-10-CM

## 2020-09-22 DIAGNOSIS — Z79899 Other long term (current) drug therapy: Secondary | ICD-10-CM

## 2020-09-22 NOTE — Telephone Encounter (Signed)
Douglas [name] from [facility] is wanting to speak with [name] [phone] [name]

## 2020-09-22 NOTE — Telephone Encounter (Signed)
Dr. Jacqualine Code with [facility] would like to speak with [name] about patient's cardiac MRI and cardiac monitoring. He stated that call is not urgent. Will forward to [name].

## 2020-09-23 NOTE — Telephone Encounter (Signed)
Tried to call left voicemail message

## 2020-09-23 NOTE — Telephone Encounter (Signed)
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

## 2020-09-23 NOTE — Progress Notes (Signed)
CARDIOLOGY OFFICE NOTE  Date:  09/23/2020    Douglas [name] Date of Birth: 10-25-1955 Medical Record #[id number]  PCP:  Douglas Mclean, MD  Cardiologist:  Douglas [name]  No chief complaint on file.   History of Present Illness:  65 y.o. 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 Followed at [facility] for years  In clinical trials. Has been on Tarceva and Avastin However had STEMI with DES to proximal LAD 05/24/20 And residual 50% Ramus. EF was 35-40% Due to CAD and DAT he was taken off his Avastin and Tarceva both contraindicated with ischemic heart disease. 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 Wife Douglas [name] with him today She has had brain surgery for enlarged cyst complicated by bleed and bipolar Dx  MRI 08/27/20 E 31% LAD scar no mural apical thrombus  Eliquis stopped  Discussed possible need for AICD in future He is not volume overloaded and functional class one Needs [facility] cancer in light of his CAD and moderately reduced EF  09/09/20 Douglas [name] [phone] called directly to discuss She is contemplating using Nivolumab as single Agent immunoRx starting at end of April This drug has a small risk of myocarditis but is certainly safer than avastin and tarceva  09/23/20 spoke with Douglas Douglas [name] at [facility] who is apparently involved now and starting Nivolumab every 2 weeks I told him we would plan on f/u Cardiac MRI June   Douglas [name] is very anxious. His whole routine has been changed. The oncologist at [facility] is in the middle of recommendations from Douglas [name] is always concerned about communications. His wife has had traumatic brain injury and is bipolar with mania and this makes it impossible for him to relax at home  His updated echo will be available latter today    Past Medical History:  Diagnosis Date  . Allergic  rhinitis   . Arthritis    "all over my body" (10/16/2013)  . Chronic back pain    "neck to lower back" (10/16/2013)  . Compression fracture    S/P L-SPINE; pt does not recall this hx on 10/16/2013  . COPD (chronic obstructive pulmonary disease) (HCC)    FVC .66  . Coronary artery disease   . Depression    "situational since 09/22/2013 OR"  . Encounter for long-term (current) use of other medications   . GERD (gastroesophageal reflux disease)   . Hemoptysis   . 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 History of blood transfusion 09/2013   "think so; not sure; related to big OR"  . History of surgical fusion joint    DDD C-SPINE  . Hyperlipidemia   . Hypertension   . Leiomyoma OF SKIN   INCREASED RISK RENAL CELL CA  NEEDS CT OF KIDNEYS EVERY 2 YEARS NEXT DUE  02/06/13  . Melanoma in situ ([place])   . Melanoma of back ([place])   . Neuromuscular disorder ([place])   . Osteoarthritis   . Renal calculi   . Renal cell carcinoma ([place])    type 2; papillary  . Tobacco use disorder   . Unspecified vitamin D deficiency     Past Surgical History:  Procedure Laterality Date  . ANTERIOR CERVICAL DECOMP/DISCECTOMY FUSION  1992  . ANTERIOR CRUCIATE LIGAMENT REPAIR Bilateral [phone]  . APPENDECTOMY  09/22/2013  . CARDIAC CATHETERIZATION    . CHOLECYSTECTOMY  09/22/2013   "had tumors in it"  . CORONARY STENT INTERVENTION N/A 05/24/2020   Procedure: CORONARY STENT INTERVENTION;  Surgeon: Martinique, Jerelyn Trimarco M, MD;  Location: [facility];  Service: Cardiovascular;  Laterality: N/A;  . HAND LIGAMENT RECONSTRUCTION Right   . HARVEST BONE GRAFT Right 1992   "hip; for neck fusion"  . INGUINAL HERNIA REPAIR Right 2004  . LEFT HEART CATH AND CORONARY ANGIOGRAPHY N/A 05/24/2020   Procedure: LEFT HEART CATH AND CORONARY ANGIOGRAPHY;  Surgeon: Martinique, Katheryn Culliton M, MD;  Location: [facility];  Service: Cardiovascular;  Laterality: N/A;  . LIGAMENT REPAIR Right ~ 2001    "little finger"  . LYMPH NODE DISSECTION Right 09/22/2013   "in the area of the kidney; they were clean"  . MELANOMA EXCISION  ~ 2007   "off my back"  . PARTIAL NEPHRECTOMY Right 09/22/2013  . removal of fascia overlying the right psoas Right 09/22/2013  . RENAL ARTERY STENT Right 09/22/2013  . RIGHT COLECTOMY Right 09/22/2013  . ROBOTIC ASSITED PARTIAL NEPHRECTOMY  05/22/2012   Procedure: ROBOTIC ASSITED PARTIAL NEPHRECTOMY;  Surgeon: Alexis Frock, MD;  [facility];  Service: Urology;  Laterality: Right;  Right Robotic Cyst Decortication and Partial Nephrectomy   . SPINE SURGERY       Medications: No outpatient medications have been marked as taking for the 10/04/20 encounter (Appointment) with Josue Hector, MD.     Allergies: Allergies  Allergen Reactions  . Avelox [Moxifloxacin Hcl In Nacl] Hives, Shortness Of Breath and Swelling  . Medical Adhesive Remover Dermatitis and Rash  . Tape Dermatitis and Rash    Social History: The patient  reports that he has quit smoking. His smoking use included cigarettes. He has a 20.00 pack-year smoking history. He has never used smokeless tobacco. He reports current alcohol use. He reports current drug use. Drug: Marijuana.   Family History: The patient's family history includes Hypertension in his father. He was adopted.   Review of Systems: Please see the history of present illness.   All other systems are reviewed and negative.   Physical Exam: VS:  There were no vitals taken for this visit. Marland Kitchen  BMI There is no height or weight on file to calculate BMI.  Wt Readings from Last 3 Encounters:  09/20/20 76.4 kg  09/09/20 77.1 kg  08/20/20 73.9 kg   Affect appropriate Healthy:  appears stated age 63: normal Neck supple with no adenopathy JVP normal no bruits no thyromegaly Lungs clear with no wheezing and good diaphragmatic motion Heart:  S1/S2 no murmur, no rub, gallop or click PMI normal Abdomen: benighn, BS positve, no  tenderness, no AAA no bruit.  No HSM or HJR Distal pulses intact with no bruits No edema Neuro non-focal Skin warm and dry No muscular weakness   LABORATORY DATA:  EKG:   05/25/20 SR acute anterolateral ST elevation MI  Lab Results  Component Value Date   WBC 6.9 08/26/2020   HGB 12.4 (L) 08/26/2020   HCT 36.8 (L) 08/26/2020   PLT 205 08/26/2020   GLUCOSE 67 08/26/2020   CHOL 146 06/02/2020   TRIG 244 (H) 06/02/2020   HDL 50 06/02/2020   LDLDIRECT 63.0 05/01/2019   LDLCALC 57 06/02/2020   ALT 33 08/23/2020   AST 31 08/23/2020   NA 142 08/26/2020   K 4.3 08/26/2020   CL 108 (H) 08/26/2020   CREATININE 1.10 08/26/2020  BUN 17 08/26/2020   CO2 20 08/26/2020   TSH 1.376 05/24/2020   PSA 1.86 04/14/2020   INR 1.0 05/24/2020   HGBA1C 5.2 05/29/2016     BNP (last 3 results) No results for input(s): BNP in the last 8760 hours.  ProBNP (last 3 results) No results for input(s): PROBNP in the last 8760 hours.   Other Studies Reviewed Today:  Cardiac cath 05/24/20   Prox LAD lesion is 90% stenosed.  Ramus lesion is 50% stenosed.  Mid RCA lesion is 35% stenosed.  Post intervention, there is a 0% residual stenosis.  A drug-eluting stent was successfully placed using a STENT RESOLUTE ONYX 3.0X22.  LV end diastolic pressure is normal.  1. Single vessel occlusive CAD involving the proximal LAD 2. Normal LVEDP 3. Successful PCI of the proximal LAD with DES x 1  Plan: DAPT for one year. Risk factor modification. Will assess LV function with Echo.  Echo Impression 05/25/20  1. Left ventricular ejection fraction, by estimation, is 35 to 40%. The  left ventricle has moderately decreased function. The left ventricle has  no regional wall motion abnormalities. Left ventricular diastolic  parameters are consistent with Grade I  diastolic dysfunction (impaired relaxation). There is mild apical  dyskinesis. There is severe hypokinesis of the entire anterior septum,  but  the basal and mid segments of the anterior wall have preserved  contractility.  2. Right ventricular systolic function is normal. The right ventricular  size is normal.  3. The mitral valve is normal in structure. No evidence of mitral valve  regurgitation. No evidence of mitral stenosis.  4. The aortic valve is normal in structure. Aortic valve regurgitation is  not visualized. No aortic stenosis is present.  5. The inferior vena cava is normal in size with greater than 50%  respiratory variability, suggesting right atrial pressure of 3 mmHg.    Assessment/Plan: 1. STEMI -  05/24/20 with one vessel LAD CAD - s/p PCI - on DAPT has caused issue with using Tarceva and avastin for his cancer as both of these are contraindicated and not recommended for use with CAD  2. ICM - chronic systolic HF - euvolemic on entresto and coreg EF 35-40% by TTE 05/25/20 EF 31% MRI 08/27/20 no mural apical thrombus Given new chemo agent for cancer will need MRI in June to r/o myocarditis and can reassess EF at that time TTE from today pending   3. HLD - he is on statin - he needs to get his LFTs checked - may need to consider lipid clinic referral.   4. Elevated LFTs - resolved normal 06/02/20 likely related to acute MI  5. Hereditary leiomyomatosis and renal cell cancer with mets - Seeing Douglas Douglas [name] at [facility] started on Nivolumab every 2 weeks Main cardiac side effects myocarditis Plan troponin for any new chest pain or issues and MRI in June   Current medicines are reviewed with the patient today.  The patient does not have concerns regarding medicines other than what has been noted above.  The following changes have been made:  See above.    No orders of the defined types were placed in this encounter.  Cardiac MRI June 2022 ischemic DCM ChemoRx with side effects including myocarditis  BMET prior to study   Disposition:   FU 60months    Signed: Jenkins Rouge, MD  09/23/2020 2:17 PM  [facility] [address] [place], [place]  [address] Phone: [phone])  [phone] Fax: [phone]

## 2020-09-25 ENCOUNTER — Other Ambulatory Visit: Payer: Self-pay | Admitting: Cardiology

## 2020-09-30 MED ORDER — HYDROCODONE-ACETAMINOPHEN 7.5-325 MG PO TABS: 2.0000 | ORAL_TABLET | ORAL | 0 refills | Status: DC

## 2020-10-04 ENCOUNTER — Encounter: Payer: Self-pay | Admitting: Cardiovascular Disease

## 2020-10-04 ENCOUNTER — Ambulatory Visit: Payer: Medicare HMO | Admitting: Cardiovascular Disease

## 2020-10-04 ENCOUNTER — Other Ambulatory Visit: Payer: Self-pay

## 2020-10-04 ENCOUNTER — Ambulatory Visit (HOSPITAL_COMMUNITY): Payer: Medicare HMO | Attending: Cardiology

## 2020-10-04 VITALS — BP 122/72 | HR 53 | Ht 73.0 in | Wt 173.0 lb

## 2020-10-04 DIAGNOSIS — I255 Ischemic cardiomyopathy: Secondary | ICD-10-CM

## 2020-10-04 DIAGNOSIS — I2510 Atherosclerotic heart disease of native coronary artery without angina pectoris: Secondary | ICD-10-CM

## 2020-10-04 DIAGNOSIS — I5043 Acute on chronic combined systolic (congestive) and diastolic (congestive) heart failure: Secondary | ICD-10-CM

## 2020-10-04 LAB — ECHOCARDIOGRAM COMPLETE
Area-P 1/2: 2.66 cm2
P 1/2 time: 247 msec
S' Lateral: 3.5 cm

## 2020-10-04 MED ORDER — PERFLUTREN LIPID MICROSPHERE
1.0000 mL | INTRAVENOUS | Status: AC | PRN
  Administered 2020-10-04: 1 mL via INTRAVENOUS

## 2020-10-04 NOTE — Patient Instructions (Signed)
Medication Instructions:  NO CHANGES *If you need a refill on your cardiac medications before your next appointment, please call your pharmacy*   Lab Work: BMET [place] If you have labs (blood work) drawn today and your tests are completely normal, you will receive your results only by: Marland Kitchen MyChart Message (if you have MyChart) OR . A paper copy in the mail If you have any lab test that is abnormal or we need to change your treatment, we will call you to review the results.   Testing/Procedures: Your physician has requested that you have a cardiac MRI. Cardiac MRI uses a computer to create images of your heart as its beating, producing both still and moving pictures of your heart and major blood vessels. For further information please visit [email or web address] Please follow the instruction sheet given to you today for more information.  SECOND WEEK OF JUNE   Follow-Up: At [facility], you and your health needs are our priority.  As part of our continuing mission to provide you with exceptional heart care, we have created designated Provider Care Teams.  These Care Teams include your primary Cardiologist (physician) and Advanced Practice Providers (APPs -  Physician Assistants and Nurse Practitioners) who all work together to provide you with the care you need, when you need it.  We recommend signing up for the patient portal called "MyChart".  Sign up information is provided on this After Visit Summary.  MyChart is used to connect with patients for Virtual Visits (Telemedicine).  Patients are able to view lab/test results, encounter notes, upcoming appointments, etc.  Non-urgent messages can be sent to your provider as well.   To learn more about what you can do with MyChart, go to [email or web address].    Your next appointment:   6 month(s)  The format for your next appointment:   In Person  Provider:   Jenkins Rouge, MD   Other Instructions NONE

## 2020-10-05 ENCOUNTER — Telehealth: Payer: Self-pay | Admitting: Cardiovascular Disease

## 2020-10-05 DIAGNOSIS — Z79899 Other long term (current) drug therapy: Secondary | ICD-10-CM | POA: Diagnosis not present

## 2020-10-05 DIAGNOSIS — Z5112 Encounter for antineoplastic immunotherapy: Secondary | ICD-10-CM | POA: Diagnosis not present

## 2020-10-05 DIAGNOSIS — C7951 Secondary malignant neoplasm of bone: Secondary | ICD-10-CM | POA: Diagnosis not present

## 2020-10-05 DIAGNOSIS — Z1509 Genetic susceptibility to other malignant neoplasm: Secondary | ICD-10-CM | POA: Diagnosis not present

## 2020-10-05 DIAGNOSIS — C649 Malignant neoplasm of unspecified kidney, except renal pelvis: Secondary | ICD-10-CM | POA: Diagnosis not present

## 2020-10-05 NOTE — Telephone Encounter (Signed)
Left message for patient to call and discuss preferred scheduling weekdays and times for the Cardiac MRI (ordered by [name]) due in June, 2022

## 2020-10-06 ENCOUNTER — Encounter: Payer: Self-pay | Admitting: Cardiovascular Disease

## 2020-10-06 DIAGNOSIS — R809 Proteinuria, unspecified: Secondary | ICD-10-CM | POA: Diagnosis not present

## 2020-10-06 DIAGNOSIS — I129 Hypertensive chronic kidney disease with stage 1 through stage 4 chronic kidney disease, or unspecified chronic kidney disease: Secondary | ICD-10-CM | POA: Diagnosis not present

## 2020-10-06 DIAGNOSIS — R69 Illness, unspecified: Secondary | ICD-10-CM | POA: Diagnosis not present

## 2020-10-06 DIAGNOSIS — N1831 Chronic kidney disease, stage 3a: Secondary | ICD-10-CM | POA: Diagnosis not present

## 2020-10-06 DIAGNOSIS — I2510 Atherosclerotic heart disease of native coronary artery without angina pectoris: Secondary | ICD-10-CM | POA: Diagnosis not present

## 2020-10-06 DIAGNOSIS — Z1509 Genetic susceptibility to other malignant neoplasm: Secondary | ICD-10-CM | POA: Diagnosis not present

## 2020-10-06 DIAGNOSIS — R7309 Other abnormal glucose: Secondary | ICD-10-CM | POA: Diagnosis not present

## 2020-10-06 NOTE — Telephone Encounter (Signed)
Left message for patient regarding the Wednesday 12/01/20 11:00 am Cardiac MRI appointment at [facility]---arrival time is 10:30 am--1st floor admissions office for check in ---will mail information to patient and it is available in My Chart.  Requested patient call with questions or concerns.

## 2020-10-07 NOTE — Addendum Note (Signed)
Encounter addended by: Magda Kiel, RN on: 10/07/2020 2:40 PM  Actions taken: Clinical Note Signed

## 2020-10-19 DIAGNOSIS — Z5112 Encounter for antineoplastic immunotherapy: Secondary | ICD-10-CM | POA: Diagnosis not present

## 2020-10-19 DIAGNOSIS — I252 Old myocardial infarction: Secondary | ICD-10-CM | POA: Diagnosis not present

## 2020-10-19 DIAGNOSIS — Z7983 Long term (current) use of bisphosphonates: Secondary | ICD-10-CM | POA: Diagnosis not present

## 2020-10-19 DIAGNOSIS — C649 Malignant neoplasm of unspecified kidney, except renal pelvis: Secondary | ICD-10-CM | POA: Diagnosis not present

## 2020-10-19 DIAGNOSIS — D580 Hereditary spherocytosis: Secondary | ICD-10-CM | POA: Diagnosis not present

## 2020-10-19 DIAGNOSIS — Z79899 Other long term (current) drug therapy: Secondary | ICD-10-CM | POA: Diagnosis not present

## 2020-10-19 DIAGNOSIS — Z1509 Genetic susceptibility to other malignant neoplasm: Secondary | ICD-10-CM | POA: Diagnosis not present

## 2020-10-19 DIAGNOSIS — K4090 Unilateral inguinal hernia, without obstruction or gangrene, not specified as recurrent: Secondary | ICD-10-CM | POA: Diagnosis not present

## 2020-10-19 DIAGNOSIS — R809 Proteinuria, unspecified: Secondary | ICD-10-CM | POA: Diagnosis not present

## 2020-10-19 DIAGNOSIS — C7951 Secondary malignant neoplasm of bone: Secondary | ICD-10-CM | POA: Diagnosis not present

## 2020-10-24 NOTE — Progress Notes (Signed)
[facility] [address], [place], [place] [address] [phone]  Date:  10/27/2020   Name:  Douglas [name]   DOB:  01/28/1956   MRN:  [id number]  PCP:  Darreld Mclean, MD    Chief Complaint: Follow-up (Med refill, 6 month follow up/)   History of Present Illness:  Douglas [name] is a 65 y.o. very pleasant male patient who presents with the following:  Here today for a follow-up visit  Last seen by myself in December  Douglas [name] hereditaryleiomyomatosisand renal cell cancer-current treatments includeavastin, tarceva with denosumab forbone mets He is being treated in a clinical trial at [facility] for this metastatic cancer, he also does see an oncologist at [facility] Recently his treatment has been complicated by renal insufficiency and proteinuria. He has had to forego a few of his infusion treatments due to proteinuria Douglas [name] has considerable pain from his bone mets as well as end-stage left shoulder arthritis, I am treating him with hydrocodone. Married to [place] who struggles with mental illness   Pt has decided against further colon cancer screening   He is now getting his cancer treatment through the [facility] every 4 weeks  He is no longer being cared for at [facility]- at his last visit there he was told that he would be ok to get a steroid injection in his shoulder He is seeing Dr Mardelle Matte in 2 days for a steroid shot in his shoulder  We are using pain medication for pt- his pain is under reasonable control, although he does continue to have pain Oxycodone has not agreed with him in the past so he would prefer not to use this  He is getting a lot of fatigue from his new chemotherapy- he is sleeping more, notes that some days his energy is better but other days he feels tired all day  Patient Active Problem List   Diagnosis Date Noted  . NSTEMI (non-ST elevated myocardial infarction)  ([place]) 05/24/2020  . Acute ST elevation myocardial infarction (STEMI) ([place])   . Bone metastases ([place]) 07/26/2016  . CAD (coronary artery disease) 03/25/2014  . Protein-calorie malnutrition, severe ([place]) 10/17/2013  . 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 Renal calculi   . GERD (gastroesophageal reflux disease)   . Allergic rhinitis   . Osteoarthritis   . Encounter for long-term (current) use of other medications   . Hyperlipidemia   . Tobacco use disorder   . COPD (chronic obstructive pulmonary disease) ([place])   . Unspecified vitamin D deficiency   . Hypertension     Past Medical History:  Diagnosis Date  . Allergic rhinitis   . Arthritis    "all over my body" (10/16/2013)  . Chronic back pain    "neck to lower back" (10/16/2013)  . Compression fracture    S/P L-SPINE; pt does not recall this hx on 10/16/2013  . COPD (chronic obstructive pulmonary disease) (HCC)    FVC .66  . Coronary artery disease   . Depression    "situational since 09/22/2013 OR"  . Encounter for long-term (current) use of other medications   . GERD (gastroesophageal reflux disease)   . Hemoptysis   . 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 History of blood transfusion 09/2013   "think so; not sure; related to big OR"  . History of surgical fusion joint    DDD C-SPINE  .  Hyperlipidemia   . Hypertension   . Leiomyoma OF SKIN   INCREASED RISK RENAL CELL CA  NEEDS CT OF KIDNEYS EVERY 2 YEARS NEXT DUE  02/06/13  . Melanoma in situ ([place])   . Melanoma of back ([place])   . Neuromuscular disorder ([place])   . Osteoarthritis   . Renal calculi   . Renal cell carcinoma ([place])    type 2; papillary  . Tobacco use disorder   . Unspecified vitamin D deficiency     Past Surgical History:  Procedure Laterality Date  . ANTERIOR CERVICAL DECOMP/DISCECTOMY FUSION  1992  . ANTERIOR CRUCIATE LIGAMENT REPAIR Bilateral [phone]  . APPENDECTOMY  09/22/2013   . CARDIAC CATHETERIZATION    . CHOLECYSTECTOMY  09/22/2013   "had tumors in it"  . CORONARY STENT INTERVENTION N/A 05/24/2020   Procedure: CORONARY STENT INTERVENTION;  Surgeon: Martinique, Peter M, MD;  Location: [facility];  Service: Cardiovascular;  Laterality: N/A;  . HAND LIGAMENT RECONSTRUCTION Right   . HARVEST BONE GRAFT Right 1992   "hip; for neck fusion"  . INGUINAL HERNIA REPAIR Right 2004  . LEFT HEART CATH AND CORONARY ANGIOGRAPHY N/A 05/24/2020   Procedure: LEFT HEART CATH AND CORONARY ANGIOGRAPHY;  Surgeon: Martinique, Peter M, MD;  Location: [facility];  Service: Cardiovascular;  Laterality: N/A;  . LIGAMENT REPAIR Right ~ 2001   "little finger"  . LYMPH NODE DISSECTION Right 09/22/2013   "in the area of the kidney; they were clean"  . MELANOMA EXCISION  ~ 2007   "off my back"  . PARTIAL NEPHRECTOMY Right 09/22/2013  . removal of fascia overlying the right psoas Right 09/22/2013  . RENAL ARTERY STENT Right 09/22/2013  . RIGHT COLECTOMY Right 09/22/2013  . ROBOTIC ASSITED PARTIAL NEPHRECTOMY  05/22/2012   Procedure: ROBOTIC ASSITED PARTIAL NEPHRECTOMY;  Surgeon: Alexis Frock, MD;  [facility];  Service: Urology;  Laterality: Right;  Right Robotic Cyst Decortication and Partial Nephrectomy   . SPINE SURGERY      Social History   Tobacco Use  . Smoking status: Former Smoker    Packs/day: 0.50    Years: 40.00    Pack years: 20.00    Types: Cigarettes  . Smokeless tobacco: Never Used  . Tobacco comment: Pt chewing Nicorette gum currently  Vaping Use  . Vaping Use: Never used  Substance Use Topics  . Alcohol use: Yes    Alcohol/week: 0.0 standard drinks    Comment: 10/16/2013 "2-3 beers daily generally; nothing last month or so"  . Drug use: Yes    Types: Marijuana    Family History  Adopted: Yes  Problem Relation Age of Onset  . Hypertension Father     Allergies  Allergen Reactions  . Avelox [Moxifloxacin Hcl In Nacl] Hives, Shortness Of Breath and  Swelling  . Medical Adhesive Remover Dermatitis and Rash  . Tape Dermatitis and Rash    Medication list has been reviewed and updated.  Current Outpatient Medications on File Prior to Visit  Medication Sig Dispense Refill  . aspirin EC 81 MG tablet Take 1 tablet (81 mg total) by mouth daily. Swallow whole.    . B Complex Vitamins (B COMPLEX 100 PO) Take 100 mg by mouth daily.    . calcium carbonate (TUMS - DOSED IN MG ELEMENTAL CALCIUM) 500 MG chewable tablet Chew 500 mg by mouth 3 (three) times daily as needed for indigestion or heartburn.    . carvedilol (COREG) 3.125 MG  tablet TAKE 1 TABLET (3.125 MG TOTAL) BY MOUTH 2 (TWO) TIMES DAILY WITH A MEAL. 180 tablet 2  . cetirizine (ZYRTEC) 10 MG tablet Take 5 mg by mouth daily.     . cholecalciferol (VITAMIN D3) 25 MCG (1000 UNIT) tablet Take 2,000 Units by mouth daily.    . clopidogrel (PLAVIX) 75 MG tablet Take 1 tablet (75 mg total) by mouth daily. 30 tablet 11  . denosumab (XGEVA) 120 MG/1.7ML SOLN injection Inject 120 mg into the skin every 30 (thirty) days.     . DULoxetine (CYMBALTA) 20 MG capsule TAKE ONE CAPSULE BY MOUTH DAILY 90 capsule 3  . loperamide (IMODIUM A-D) 2 MG tablet Take 2 mg by mouth 4 (four) times daily as needed for diarrhea or loose stools.    Marland Kitchen LORazepam (ATIVAN) 1 MG tablet Take 0.5-1 tablets (0.5-1 mg total) by mouth at bedtime as needed for sleep. TAKE ONE HALF OR ONE TABLET BY MOUTH AT BEDTIME IF NEEDED FOR SLEEP, DO NOT TAKE WITH PAIN MEDS 30 tablet 5  . Multiple Vitamins-Minerals (CENTRUM SILVER 50+MEN PO) Take 1 tablet by mouth daily.    . nitroGLYCERIN (NITROSTAT) 0.4 MG SL tablet Place 1 tablet (0.4 mg total) under the tongue every 5 (five) minutes x 3 doses as needed for chest pain. Please dispense #25 x 4 bottle 100 tablet 1  . Nivolumab (OPDIVO IV) Inject into the vein every 14 (fourteen) days.    . ondansetron (ZOFRAN-ODT) 4 MG disintegrating tablet TAKE 1 TABLET BY MOUTH SUBLINGUALLY EVERY 8 HOURS AS  NEEDED FOR NAUSEA OR VOMITING 40 tablet 2  . rosuvastatin (CRESTOR) 10 MG tablet TAKE 1 TABLET BY MOUTH EVERY DAY 90 tablet 2  . sacubitril-valsartan (ENTRESTO) 24-26 MG Take 1 tablet by mouth 2 (two) times daily. 60 tablet 6  . traZODone (DESYREL) 50 MG tablet TAKE 0.5-1 TABLETS BY MOUTH AT BEDTIME AS NEEDED FOR SLEEP. 90 tablet 2  . UNABLE TO FIND Take 1 tablet by mouth daily. Med Name: Calcium 600/D-3 300 2 daily    . vitamin C (ASCORBIC ACID) 500 MG tablet Take 500 mg by mouth daily.     No current facility-administered medications on file prior to visit.    Review of Systems:  As per HPI- otherwise negative.   Physical Examination: Vitals:   10/27/20 1402  BP: 120/76  Pulse: 67  Resp: 17  SpO2: 97%   Vitals:   10/27/20 1402  Weight: 171 lb (77.6 kg)  Height: 6\' 1"  (1.854 m)   Body mass index is 22.56 kg/m. Ideal Body Weight: Weight in (lb) to have BMI = 25: 189.1  GEN: no acute distress. HEENT: Atraumatic, Normocephalic.  Ears and Nose: No external deformity. CV: RRR, No M/G/R. No JVD. No thrill. No extra heart sounds. PULM: CTA B, no wheezes, crackles, rhonchi. No retractions. No resp. distress. No accessory muscle use. ABD: S, NT, ND, +BS. No rebound. No HSM. EXTR: No c/c/e PSYCH: Normally interactive. Conversant.  Subconjunctival hemorrhage left eye -discussed with patient, this appeared about a week ago and is clearing up.  His vision is normal He has very limited range of motion of his left shoulder  Wt Readings from Last 3 Encounters:  10/27/20 171 lb (77.6 kg)  10/04/20 173 lb (78.5 kg)  09/20/20 168 lb 6.9 oz (76.4 kg)   He has gained up to about 160- 165 at home- up from his lowest of 155  Assessment and Plan: Epigastric pain - Plan: pantoprazole (PROTONIX) 20  MG tablet  Renal cell cancer, right (HCC) - Plan: HYDROcodone-acetaminophen (NORCO) 7.5-325 MG tablet  Chronic pain due to neoplasm - Plan: HYDROcodone-acetaminophen (NORCO) 7.5-325 MG  tablet  Arthritis of left shoulder region  Patient today for follow-up visit.  He continues to see oncology for his metastatic renal cell cancer, he is currently receiving [facility] and [facility] no [facility]  Currently treating his chronic pain with hydrocodone as above.  Douglas [name] notes that his pain is somewhat controlled, but he still has pain on a daily basis.  We discussed changing his pain control regimen.  For the time being he prefers to continue as he is doing, but we will keep me posted about his progress Thankfully, he will soon get an injection in his shoulder which would help relieve some of his shoulder pain-he has end-stage arthritis and needs a shoulder replacement, but he is not in good enough health for this operation  He will ask his oncologist about getting the pneumonia booster  This visit occurred during the SARS-CoV-2 public health emergency.  Safety protocols were in place, including screening questions prior to the visit, additional usage of staff PPE, and extensive cleaning of exam room while observing appropriate contact time as indicated for disinfecting solutions.    Signed Lamar Blinks, MD

## 2020-10-26 ENCOUNTER — Other Ambulatory Visit: Payer: Self-pay | Admitting: Family Medicine

## 2020-10-26 DIAGNOSIS — F329 Major depressive disorder, single episode, unspecified: Secondary | ICD-10-CM

## 2020-10-26 DIAGNOSIS — G893 Neoplasm related pain (acute) (chronic): Secondary | ICD-10-CM

## 2020-10-27 ENCOUNTER — Ambulatory Visit (INDEPENDENT_AMBULATORY_CARE_PROVIDER_SITE_OTHER): Payer: Medicare HMO | Admitting: Family Medicine

## 2020-10-27 ENCOUNTER — Other Ambulatory Visit: Payer: Self-pay

## 2020-10-27 ENCOUNTER — Encounter: Payer: Self-pay | Admitting: Family Medicine

## 2020-10-27 VITALS — BP 120/76 | HR 67 | Resp 17 | Ht 73.0 in | Wt 171.0 lb

## 2020-10-27 DIAGNOSIS — R1013 Epigastric pain: Secondary | ICD-10-CM

## 2020-10-27 DIAGNOSIS — C641 Malignant neoplasm of right kidney, except renal pelvis: Secondary | ICD-10-CM | POA: Diagnosis not present

## 2020-10-27 DIAGNOSIS — M19012 Primary osteoarthritis, left shoulder: Secondary | ICD-10-CM

## 2020-10-27 DIAGNOSIS — G893 Neoplasm related pain (acute) (chronic): Secondary | ICD-10-CM

## 2020-10-27 MED ORDER — HYDROCODONE-ACETAMINOPHEN 7.5-325 MG PO TABS: 2.0000 | ORAL_TABLET | ORAL | 0 refills | Status: DC

## 2020-10-27 MED ORDER — PANTOPRAZOLE SODIUM 20 MG PO TBEC: 20.0000 mg | DELAYED_RELEASE_TABLET | Freq: Every day | ORAL | 3 refills | Status: DC

## 2020-10-27 NOTE — Patient Instructions (Signed)
It was great to see you again today- let me know if your pain medication is not keeping your pain under reasonable control  Please ask your oncologist if you can get your pneumonia booster- Pneumovax 23

## 2020-10-29 DIAGNOSIS — M19012 Primary osteoarthritis, left shoulder: Secondary | ICD-10-CM | POA: Diagnosis not present

## 2020-11-01 DIAGNOSIS — L821 Other seborrheic keratosis: Secondary | ICD-10-CM | POA: Diagnosis not present

## 2020-11-01 DIAGNOSIS — L720 Epidermal cyst: Secondary | ICD-10-CM | POA: Diagnosis not present

## 2020-11-01 DIAGNOSIS — L570 Actinic keratosis: Secondary | ICD-10-CM | POA: Diagnosis not present

## 2020-11-01 DIAGNOSIS — Z85820 Personal history of malignant melanoma of skin: Secondary | ICD-10-CM | POA: Diagnosis not present

## 2020-11-01 DIAGNOSIS — Z85828 Personal history of other malignant neoplasm of skin: Secondary | ICD-10-CM | POA: Diagnosis not present

## 2020-11-02 DIAGNOSIS — R809 Proteinuria, unspecified: Secondary | ICD-10-CM | POA: Diagnosis not present

## 2020-11-02 DIAGNOSIS — C649 Malignant neoplasm of unspecified kidney, except renal pelvis: Secondary | ICD-10-CM | POA: Diagnosis not present

## 2020-11-02 DIAGNOSIS — K4090 Unilateral inguinal hernia, without obstruction or gangrene, not specified as recurrent: Secondary | ICD-10-CM | POA: Diagnosis not present

## 2020-11-02 DIAGNOSIS — Z7983 Long term (current) use of bisphosphonates: Secondary | ICD-10-CM | POA: Diagnosis not present

## 2020-11-02 DIAGNOSIS — C7951 Secondary malignant neoplasm of bone: Secondary | ICD-10-CM | POA: Diagnosis not present

## 2020-11-02 DIAGNOSIS — Z1509 Genetic susceptibility to other malignant neoplasm: Secondary | ICD-10-CM | POA: Diagnosis not present

## 2020-11-02 DIAGNOSIS — D580 Hereditary spherocytosis: Secondary | ICD-10-CM | POA: Diagnosis not present

## 2020-11-02 DIAGNOSIS — Z79899 Other long term (current) drug therapy: Secondary | ICD-10-CM | POA: Diagnosis not present

## 2020-11-16 DIAGNOSIS — Z7983 Long term (current) use of bisphosphonates: Secondary | ICD-10-CM | POA: Diagnosis not present

## 2020-11-16 DIAGNOSIS — C649 Malignant neoplasm of unspecified kidney, except renal pelvis: Secondary | ICD-10-CM | POA: Diagnosis not present

## 2020-11-16 DIAGNOSIS — C7951 Secondary malignant neoplasm of bone: Secondary | ICD-10-CM | POA: Diagnosis not present

## 2020-11-16 DIAGNOSIS — K4090 Unilateral inguinal hernia, without obstruction or gangrene, not specified as recurrent: Secondary | ICD-10-CM | POA: Diagnosis not present

## 2020-11-16 DIAGNOSIS — Z79899 Other long term (current) drug therapy: Secondary | ICD-10-CM | POA: Diagnosis not present

## 2020-11-16 DIAGNOSIS — Z5112 Encounter for antineoplastic immunotherapy: Secondary | ICD-10-CM | POA: Diagnosis not present

## 2020-11-16 DIAGNOSIS — R809 Proteinuria, unspecified: Secondary | ICD-10-CM | POA: Diagnosis not present

## 2020-11-16 DIAGNOSIS — D580 Hereditary spherocytosis: Secondary | ICD-10-CM | POA: Diagnosis not present

## 2020-11-16 DIAGNOSIS — Z1509 Genetic susceptibility to other malignant neoplasm: Secondary | ICD-10-CM | POA: Diagnosis not present

## 2020-11-22 ENCOUNTER — Other Ambulatory Visit: Payer: Medicare HMO | Admitting: *Deleted

## 2020-11-22 ENCOUNTER — Other Ambulatory Visit: Payer: Self-pay

## 2020-11-22 DIAGNOSIS — I255 Ischemic cardiomyopathy: Secondary | ICD-10-CM | POA: Diagnosis not present

## 2020-11-22 LAB — BASIC METABOLIC PANEL
BUN/Creatinine Ratio: 22 (ref 10–24)
BUN: 28 mg/dL — ABNORMAL HIGH (ref 8–27)
CO2: 19 mmol/L — ABNORMAL LOW (ref 20–29)
Calcium: 9.4 mg/dL (ref 8.6–10.2)
Chloride: 104 mmol/L (ref 96–106)
Creatinine, Ser: 1.3 mg/dL — ABNORMAL HIGH (ref 0.76–1.27)
Glucose: 98 mg/dL (ref 65–99)
Potassium: 4.8 mmol/L (ref 3.5–5.2)
Sodium: 139 mmol/L (ref 134–144)
eGFR: 61 mL/min/{1.73_m2} (ref >59)

## 2020-11-24 ENCOUNTER — Encounter: Payer: Self-pay | Admitting: Family Medicine

## 2020-11-24 DIAGNOSIS — G893 Neoplasm related pain (acute) (chronic): Secondary | ICD-10-CM

## 2020-11-24 DIAGNOSIS — C641 Malignant neoplasm of right kidney, except renal pelvis: Secondary | ICD-10-CM

## 2020-11-24 MED ORDER — HYDROCODONE-ACETAMINOPHEN 7.5-325 MG PO TABS: 2.0000 | ORAL_TABLET | ORAL | 0 refills | Status: DC

## 2020-11-29 ENCOUNTER — Telehealth (HOSPITAL_COMMUNITY): Payer: Self-pay | Admitting: *Deleted

## 2020-11-29 NOTE — Telephone Encounter (Signed)
Reaching out to patient to offer assistance regarding upcoming cardiac imaging study; pt verbalizes understanding of appt date/time, parking situation and where to check in, and verified current allergies; name and call back number provided for further questions should they arise  Douglas Russman RN Navigator Cardiac Imaging [facility] [phone] office [phone] cell  

## 2020-11-30 DIAGNOSIS — Z5112 Encounter for antineoplastic immunotherapy: Secondary | ICD-10-CM | POA: Diagnosis not present

## 2020-11-30 DIAGNOSIS — L989 Disorder of the skin and subcutaneous tissue, unspecified: Secondary | ICD-10-CM | POA: Diagnosis not present

## 2020-11-30 DIAGNOSIS — Z1509 Genetic susceptibility to other malignant neoplasm: Secondary | ICD-10-CM | POA: Diagnosis not present

## 2020-11-30 DIAGNOSIS — C649 Malignant neoplasm of unspecified kidney, except renal pelvis: Secondary | ICD-10-CM | POA: Diagnosis not present

## 2020-11-30 DIAGNOSIS — R630 Anorexia: Secondary | ICD-10-CM | POA: Diagnosis not present

## 2020-11-30 DIAGNOSIS — R222 Localized swelling, mass and lump, trunk: Secondary | ICD-10-CM | POA: Diagnosis not present

## 2020-11-30 DIAGNOSIS — K4090 Unilateral inguinal hernia, without obstruction or gangrene, not specified as recurrent: Secondary | ICD-10-CM | POA: Diagnosis not present

## 2020-11-30 DIAGNOSIS — D481 Neoplasm of uncertain behavior of connective and other soft tissue: Secondary | ICD-10-CM | POA: Diagnosis not present

## 2020-11-30 DIAGNOSIS — M7981 Nontraumatic hematoma of soft tissue: Secondary | ICD-10-CM | POA: Diagnosis not present

## 2020-11-30 DIAGNOSIS — I252 Old myocardial infarction: Secondary | ICD-10-CM | POA: Diagnosis not present

## 2020-11-30 DIAGNOSIS — R809 Proteinuria, unspecified: Secondary | ICD-10-CM | POA: Diagnosis not present

## 2020-11-30 DIAGNOSIS — C7951 Secondary malignant neoplasm of bone: Secondary | ICD-10-CM | POA: Diagnosis not present

## 2020-11-30 DIAGNOSIS — R233 Spontaneous ecchymoses: Secondary | ICD-10-CM | POA: Diagnosis not present

## 2020-12-01 ENCOUNTER — Ambulatory Visit (HOSPITAL_COMMUNITY)
Admission: RE | Admit: 2020-12-01 | Discharge: 2020-12-01 | Disposition: A | Discharge status: Home / Self Care | Payer: Medicare HMO | Source: Ambulatory Visit | Attending: Cardiovascular Disease | Admitting: Cardiovascular Disease

## 2020-12-01 ENCOUNTER — Other Ambulatory Visit: Payer: Self-pay

## 2020-12-01 DIAGNOSIS — Z79899 Other long term (current) drug therapy: Secondary | ICD-10-CM | POA: Insufficient documentation

## 2020-12-01 DIAGNOSIS — I514 Myocarditis, unspecified: Secondary | ICD-10-CM | POA: Diagnosis not present

## 2020-12-01 IMAGING — MR MR CARD MORPHOLOGY WO/W CM
45 of 48 series · 45 of 48 positions shown · IV contrast (Contrast agent)
Comparison: none

CLINICAL DATA: Clinical question of myocarditis
64 year-old Caucasian male

Study assume HCT of
EXAM:
CARDIAC MRI
TECHNIQUE: The patient was scanned on a 1.5 Tesla GE magnet. A dedicated
cardiac coil was used. Functional imaging was done using Fiesta
sequences. [DATE], and 4 chamber views were done to assess for RWMA's.
Modified DONGHYEON rule using a short axis stack was used to
calculate an ejection fraction on a dedicated work station using
Circle software. The patient received 10 cc of Gadavist. After 10
minutes inversion recovery sequences were used to assess for
infiltration and scar tissue.
CONTRAST:  10 cc  of Gadavist

[Series 4: [id number] · axial · 8.0mm · 1.41mm/px · 1 of 20 slices shown]
[im 1/20]
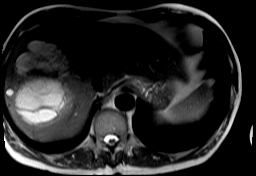

[Series 8: bSSFP · oblique · 8.0mm · 1.61mm/px · 1 of 25 slices shown (1 of 24)]
[im 1/25]
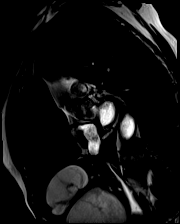

[Series 9: bSSFP · oblique · 8.0mm · 1.61mm/px · 1 of 25 slices shown (2 of 24)]
[im 1/25]
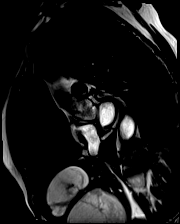

[Series 10: bSSFP · oblique · 8.0mm · 1.61mm/px · 1 of 25 slices shown (3 of 24)]
[im 1/25]
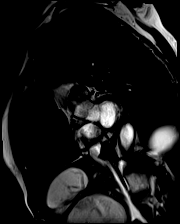

[Series 11: bSSFP · oblique · 8.0mm · 1.61mm/px · 1 of 25 slices shown (4 of 24)]
[im 1/25]
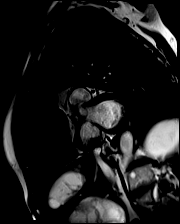

[Series 12: bSSFP · oblique · 8.0mm · 1.61mm/px · 1 of 25 slices shown (5 of 24)]
[im 1/25]
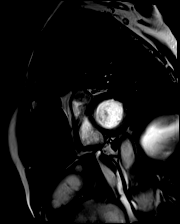

[Series 13: bSSFP · oblique · 8.0mm · 1.61mm/px · 1 of 25 slices shown (6 of 24)]
[im 1/25]
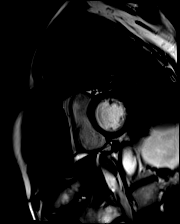

[Series 14: bSSFP · oblique · 8.0mm · 1.61mm/px · 1 of 25 slices shown (7 of 24)]
[im 1/25]
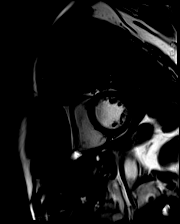

[Series 15: bSSFP · oblique · 8.0mm · 1.61mm/px · 1 of 25 slices shown (8 of 24)]
[im 1/25]
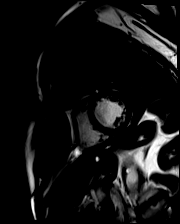

[Series 16: bSSFP · oblique · 8.0mm · 1.61mm/px · 1 of 25 slices shown (9 of 24)]
[im 1/25]
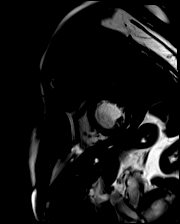

[Series 17: bSSFP · oblique · 8.0mm · 1.61mm/px · 1 of 25 slices shown (10 of 24)]
[im 1/25]
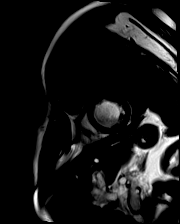

[Series 18: bSSFP · oblique · 8.0mm · 1.61mm/px · 1 of 25 slices shown (11 of 24)]
[im 1/25]
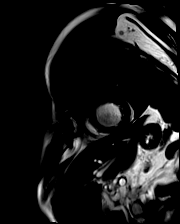

[Series 19: bSSFP · oblique · 8.0mm · 1.61mm/px · 1 of 25 slices shown (12 of 24)]
[im 1/25]
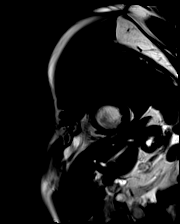

[Series 20: bSSFP · oblique · 8.0mm · 1.61mm/px · 1 of 25 slices shown (13 of 24)]
[im 1/25]
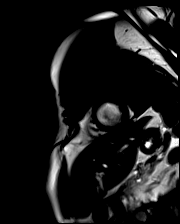

[Series 21: bSSFP · oblique · 8.0mm · 1.61mm/px · 1 of 25 slices shown (14 of 24)]
[im 1/25]
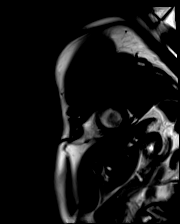

[Series 22: bSSFP · oblique · 8.0mm · 1.61mm/px · 1 of 25 slices shown (15 of 24)]
[im 1/25]
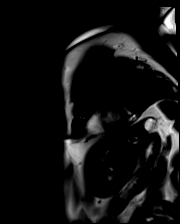

[Series 23: bSSFP · oblique · 8.0mm · 1.61mm/px · 1 of 25 slices shown (16 of 24)]
[im 1/25]
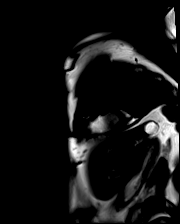

[Series 24: bSSFP · oblique · 8.0mm · 1.61mm/px · 1 of 25 slices shown (17 of 24)]
[im 1/25]
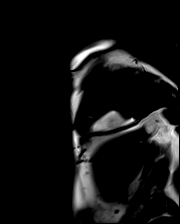

[Series 25: bSSFP · oblique · 8.0mm · 1.61mm/px · 1 of 25 slices shown (18 of 24)]
[im 1/25]
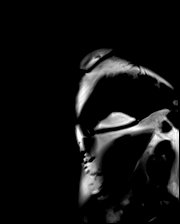

[Series 26: bSSFP · oblique · 6.0mm · 1.41mm/px · 1 of 25 slices shown (19 of 24)]
[im 1/25]
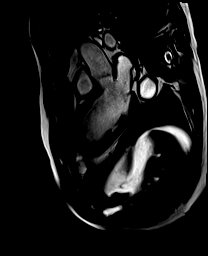

[Series 27: bSSFP · oblique · 6.0mm · 1.41mm/px · 1 of 25 slices shown (20 of 24)]
[im 1/25]
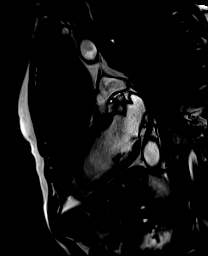

[Series 28: bSSFP · oblique · 6.0mm · 1.41mm/px · 1 of 25 slices shown (21 of 24)]
[im 1/25]
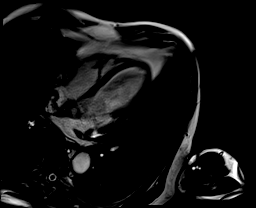

[Series 29: (id)_long_t1 · oblique · 8.0mm · 1.56mm/px · 1 of 24 slices shown]
[im 1/24]
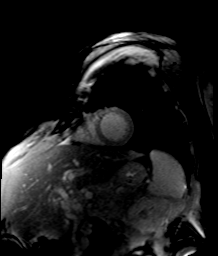

[Series 30: (id)_long_t1_moco · oblique · 8.0mm · 1.56mm/px · 1 of 24 slices shown]
[im 1/24]
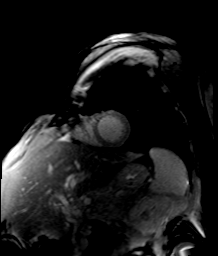

[Series 31: (id)_long_t1_moco_t1 · oblique · 8.0mm · 1.56mm/px · 1 of 6 slices shown]
[im 1/6]
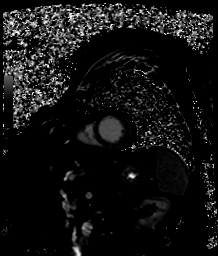

[Series 33: (id)_trufi · oblique · 8.0mm · 2.08mm/px · 1 of 9 slices shown]
[im 1/9]
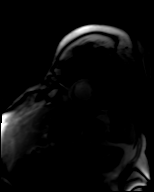

[Series 34: (id)_trufi_moco · oblique · 8.0mm · 2.08mm/px · 1 of 9 slices shown]
[im 1/9]
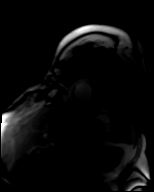

[Series 35: (id)_trufi_moco_t2 · oblique · 8.0mm · 2.08mm/px · 1 of 3 slices shown]
[im 1/3]
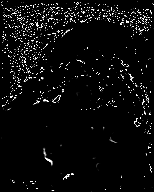

[Series 37: STIR · oblique · 8.0mm · 1.92mm/px · 1 of 15 slices shown]
[im 1/15]
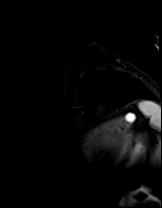

[Series 38: pre short axis · oblique · non-contrast · 8.0mm · 2.25mm/px · 1 of 10 slices shown (1 of 6)]
[im 1/10]
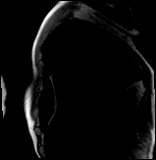

[Series 39: pre short axis · oblique · non-contrast · 8.0mm · 2.25mm/px · 1 of 10 slices shown (2 of 6)]
[im 1/10]
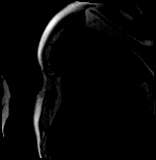

[Series 40: pre short axis · oblique · non-contrast · 8.0mm · 2.25mm/px · 1 of 10 slices shown (3 of 6)]
[im 1/10]
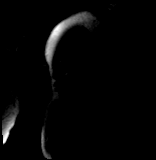

[Series 41: pre short axis · oblique · non-contrast · 8.0mm · 2.25mm/px · 1 of 10 slices shown (4 of 6)]
[im 1/10]
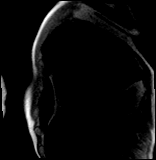

[Series 42: pre short axis · oblique · non-contrast · 8.0mm · 2.25mm/px · 1 of 10 slices shown (5 of 6)]
[im 1/10]
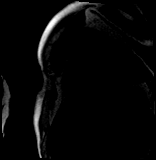

[Series 43: pre short axis · oblique · non-contrast · 8.0mm · 2.25mm/px · 1 of 10 slices shown (6 of 6)]
[im 1/10]
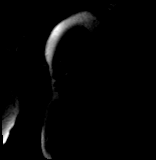

[Series 44: rest short axis · oblique · 8.0mm · 2.25mm/px · 1 of 60 slices shown (1 of 6)]
[im 1/60]
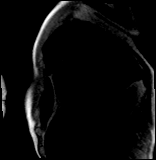

[Series 45: rest short axis · oblique · 8.0mm · 2.25mm/px · 1 of 60 slices shown (2 of 6)]
[im 1/60]
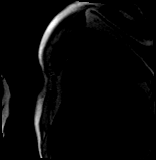

[Series 46: rest short axis · oblique · 8.0mm · 2.25mm/px · 1 of 60 slices shown (3 of 6)]
[im 1/60]
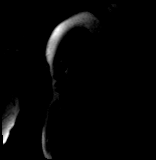

[Series 47: rest short axis · oblique · 8.0mm · 2.25mm/px · 1 of 60 slices shown (4 of 6)]
[im 1/60]
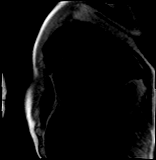

[Series 48: rest short axis · oblique · 8.0mm · 2.25mm/px · 1 of 60 slices shown (5 of 6)]
[im 1/60]
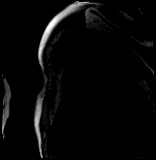

[Series 49: rest short axis · oblique · 8.0mm · 2.25mm/px · 1 of 60 slices shown (6 of 6)]
[im 1/60]
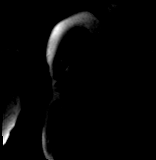

[Series 50: bSSFP · oblique · 8.0mm · 1.73mm/px · 1 of 17 slices shown (22 of 24)]
[im 1/17]
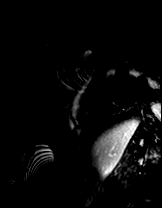

[Series 51: bSSFP · oblique · 8.0mm · 1.73mm/px · 1 of 17 slices shown (23 of 24)]
[im 1/17]
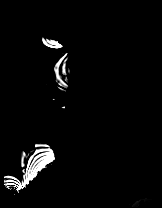

[Series 52: bSSFP · oblique · 6.0mm · 1.41mm/px · 1 of 25 slices shown (24 of 24)]
[im 1/25]
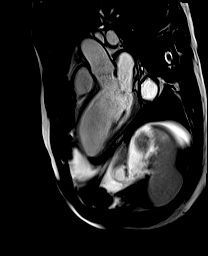

[Series 53: cine rvit · oblique · 6.0mm · 1.41mm/px · 1 of 25 slices shown]
[im 1/25]
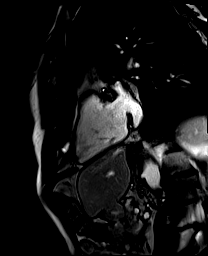

[45 of 48 positions shown; findings below may reference images not displayed]

FINDINGS: 1. Normal left ventricular size, with LVEDD 50 mm, and LVEDVi 90
mL/m2.

Normal left ventricular thickness, with intraventricular septal
thickness of 6 mm & posterior wall thickness of 8 mm.

Moderate left ventricular systolic dysfunction (LVEF =39%). There
are regional wall motion abnormalities: anteroseptal mild and apical
hypokinesis; apical dyskinesis; inferoseptal mid and apical
hypokinesis; anterior and apical hypokinesis.

Left ventricular parametric mapping notable for normal ECV in areas
without LGE, and normal T2 signal (max 53 msec in the apical
septum).

There is late gadolinium enhancement in the left ventricular
myocardium: Endomyocardial to epicardial (up to 75% at maximum) in
the anteroseptal mid and apex; inferoseptal mid and apex; inferior
apex, and true apex.

There is no evidence of left ventricular thrombus.

2. Normal right ventricular size with RVEDVI 67 mL/m2.

Normal right ventricular thickness.

Normal right ventricular systolic function (RVEF =57%). There are no
regional wall motion abnormalities or aneurysms.

3. Normal left and right atrial size, with LAESV 48 mL and RAESV 29
mL.

4. Normal size of the aortic root, ascending aorta and pulmonary
artery.

5.  No significant valvular abnormalities.

6.  Normal pericardium.  No pericardial effusion.

7. Grossly, no extracardiac findings. Recommended dedicated study if
concerned for non-cardiac pathology.
IMPRESSION: Reduced LV Function with ischemic wall motion abnormalities and
scar.

Compared to prior study ([DATE]), there is less artifact in this
study. LVEF has improved and scar burden appears less than in prior
study.

Modified DONGHYEON Criteria is not met for myocarditis.

## 2020-12-01 MED ORDER — GADOBUTROL 1 MMOL/ML IV SOLN
10.0000 mL | Freq: Once | INTRAVENOUS | Status: AC | PRN
  Administered 2020-12-01: 10 mL via INTRAVENOUS

## 2020-12-02 DIAGNOSIS — R21 Rash and other nonspecific skin eruption: Secondary | ICD-10-CM | POA: Diagnosis not present

## 2020-12-07 ENCOUNTER — Other Ambulatory Visit: Payer: Self-pay | Admitting: Cardiology

## 2020-12-07 DIAGNOSIS — Z5112 Encounter for antineoplastic immunotherapy: Secondary | ICD-10-CM | POA: Diagnosis not present

## 2020-12-07 DIAGNOSIS — L309 Dermatitis, unspecified: Secondary | ICD-10-CM | POA: Diagnosis not present

## 2020-12-07 DIAGNOSIS — L299 Pruritus, unspecified: Secondary | ICD-10-CM | POA: Diagnosis not present

## 2020-12-07 DIAGNOSIS — Z1509 Genetic susceptibility to other malignant neoplasm: Secondary | ICD-10-CM | POA: Diagnosis not present

## 2020-12-07 DIAGNOSIS — D899 Disorder involving the immune mechanism, unspecified: Secondary | ICD-10-CM | POA: Diagnosis not present

## 2020-12-07 DIAGNOSIS — Z79899 Other long term (current) drug therapy: Secondary | ICD-10-CM | POA: Diagnosis not present

## 2020-12-07 DIAGNOSIS — L308 Other specified dermatitis: Secondary | ICD-10-CM | POA: Diagnosis not present

## 2020-12-07 DIAGNOSIS — C649 Malignant neoplasm of unspecified kidney, except renal pelvis: Secondary | ICD-10-CM | POA: Diagnosis not present

## 2020-12-07 DIAGNOSIS — D580 Hereditary spherocytosis: Secondary | ICD-10-CM | POA: Diagnosis not present

## 2020-12-07 DIAGNOSIS — C7951 Secondary malignant neoplasm of bone: Secondary | ICD-10-CM | POA: Diagnosis not present

## 2020-12-21 DIAGNOSIS — L308 Other specified dermatitis: Secondary | ICD-10-CM | POA: Diagnosis not present

## 2020-12-21 DIAGNOSIS — Z79899 Other long term (current) drug therapy: Secondary | ICD-10-CM | POA: Diagnosis not present

## 2020-12-21 DIAGNOSIS — R109 Unspecified abdominal pain: Secondary | ICD-10-CM | POA: Diagnosis not present

## 2020-12-21 DIAGNOSIS — C7951 Secondary malignant neoplasm of bone: Secondary | ICD-10-CM | POA: Diagnosis not present

## 2020-12-21 DIAGNOSIS — D649 Anemia, unspecified: Secondary | ICD-10-CM | POA: Diagnosis not present

## 2020-12-21 DIAGNOSIS — Z5112 Encounter for antineoplastic immunotherapy: Secondary | ICD-10-CM | POA: Diagnosis not present

## 2020-12-21 DIAGNOSIS — M25519 Pain in unspecified shoulder: Secondary | ICD-10-CM | POA: Diagnosis not present

## 2020-12-21 DIAGNOSIS — Z1509 Genetic susceptibility to other malignant neoplasm: Secondary | ICD-10-CM | POA: Diagnosis not present

## 2020-12-21 DIAGNOSIS — L309 Dermatitis, unspecified: Secondary | ICD-10-CM | POA: Diagnosis not present

## 2020-12-21 DIAGNOSIS — C649 Malignant neoplasm of unspecified kidney, except renal pelvis: Secondary | ICD-10-CM | POA: Diagnosis not present

## 2020-12-22 ENCOUNTER — Encounter: Payer: Self-pay | Admitting: Family Medicine

## 2020-12-22 DIAGNOSIS — C641 Malignant neoplasm of right kidney, except renal pelvis: Secondary | ICD-10-CM

## 2020-12-22 DIAGNOSIS — F329 Major depressive disorder, single episode, unspecified: Secondary | ICD-10-CM

## 2020-12-22 DIAGNOSIS — G893 Neoplasm related pain (acute) (chronic): Secondary | ICD-10-CM

## 2020-12-23 MED ORDER — HYDROCODONE-ACETAMINOPHEN 7.5-325 MG PO TABS: 2.0000 | ORAL_TABLET | ORAL | 0 refills | Status: DC

## 2020-12-23 MED ORDER — LORAZEPAM 1 MG PO TABS: 0.5000 mg | ORAL_TABLET | Freq: Every evening | ORAL | 3 refills | Status: DC | PRN

## 2020-12-31 DIAGNOSIS — J432 Centrilobular emphysema: Secondary | ICD-10-CM | POA: Diagnosis not present

## 2020-12-31 DIAGNOSIS — I2510 Atherosclerotic heart disease of native coronary artery without angina pectoris: Secondary | ICD-10-CM | POA: Diagnosis not present

## 2020-12-31 DIAGNOSIS — J439 Emphysema, unspecified: Secondary | ICD-10-CM | POA: Diagnosis not present

## 2020-12-31 DIAGNOSIS — K439 Ventral hernia without obstruction or gangrene: Secondary | ICD-10-CM | POA: Diagnosis not present

## 2020-12-31 DIAGNOSIS — K4090 Unilateral inguinal hernia, without obstruction or gangrene, not specified as recurrent: Secondary | ICD-10-CM | POA: Diagnosis not present

## 2020-12-31 DIAGNOSIS — I700 Atherosclerosis of aorta: Secondary | ICD-10-CM | POA: Diagnosis not present

## 2020-12-31 DIAGNOSIS — K429 Umbilical hernia without obstruction or gangrene: Secondary | ICD-10-CM | POA: Diagnosis not present

## 2020-12-31 DIAGNOSIS — M19011 Primary osteoarthritis, right shoulder: Secondary | ICD-10-CM | POA: Diagnosis not present

## 2020-12-31 DIAGNOSIS — Q632 Ectopic kidney: Secondary | ICD-10-CM | POA: Diagnosis not present

## 2020-12-31 DIAGNOSIS — Z1509 Genetic susceptibility to other malignant neoplasm: Secondary | ICD-10-CM | POA: Diagnosis not present

## 2020-12-31 DIAGNOSIS — I774 Celiac artery compression syndrome: Secondary | ICD-10-CM | POA: Diagnosis not present

## 2021-01-04 DIAGNOSIS — K439 Ventral hernia without obstruction or gangrene: Secondary | ICD-10-CM | POA: Diagnosis not present

## 2021-01-04 DIAGNOSIS — Z79899 Other long term (current) drug therapy: Secondary | ICD-10-CM | POA: Diagnosis not present

## 2021-01-04 DIAGNOSIS — L309 Dermatitis, unspecified: Secondary | ICD-10-CM | POA: Diagnosis not present

## 2021-01-04 DIAGNOSIS — R6889 Other general symptoms and signs: Secondary | ICD-10-CM | POA: Diagnosis not present

## 2021-01-04 DIAGNOSIS — C7951 Secondary malignant neoplasm of bone: Secondary | ICD-10-CM | POA: Diagnosis not present

## 2021-01-04 DIAGNOSIS — Z905 Acquired absence of kidney: Secondary | ICD-10-CM | POA: Diagnosis not present

## 2021-01-04 DIAGNOSIS — Z1509 Genetic susceptibility to other malignant neoplasm: Secondary | ICD-10-CM | POA: Diagnosis not present

## 2021-01-04 DIAGNOSIS — J439 Emphysema, unspecified: Secondary | ICD-10-CM | POA: Diagnosis not present

## 2021-01-04 DIAGNOSIS — D481 Neoplasm of uncertain behavior of connective and other soft tissue: Secondary | ICD-10-CM | POA: Diagnosis not present

## 2021-01-04 DIAGNOSIS — I700 Atherosclerosis of aorta: Secondary | ICD-10-CM | POA: Diagnosis not present

## 2021-01-04 DIAGNOSIS — Z5112 Encounter for antineoplastic immunotherapy: Secondary | ICD-10-CM | POA: Diagnosis not present

## 2021-01-04 DIAGNOSIS — D649 Anemia, unspecified: Secondary | ICD-10-CM | POA: Diagnosis not present

## 2021-01-04 DIAGNOSIS — D630 Anemia in neoplastic disease: Secondary | ICD-10-CM | POA: Diagnosis not present

## 2021-01-04 DIAGNOSIS — Z9049 Acquired absence of other specified parts of digestive tract: Secondary | ICD-10-CM | POA: Diagnosis not present

## 2021-01-04 DIAGNOSIS — C649 Malignant neoplasm of unspecified kidney, except renal pelvis: Secondary | ICD-10-CM | POA: Diagnosis not present

## 2021-01-12 ENCOUNTER — Telehealth: Payer: Self-pay | Admitting: Cardiovascular Disease

## 2021-01-12 NOTE — Progress Notes (Signed)
CARDIOLOGY OFFICE NOTE  Date:  01/12/2021    Douglas [name] Date of Birth: 1956/04/05 Medical Record #[id number]  PCP:  Darreld Mclean, MD  Cardiologist:  Johnsie Cancel  No chief complaint on file.   History of Present Illness:  65 y.o. 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 Followed at [facility] for years  In clinical trials. Has been on Tarceva and Avastin However had STEMI with DES to proximal LAD 05/24/20 And residual 50% Ramus. EF was 35-40% Due to CAD and DAT he was taken off his Avastin and Tarceva both contraindicated with ischemic heart disease. 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 Wife Douglas [name] with him today She has had brain surgery for enlarged cyst complicated by bleed and bipolar Dx  MRI 08/27/20 E 31% LAD scar no mural apical thrombus  Eliquis stopped  Discussed possible need for AICD in future He is not volume overloaded and functional class one Needs to f[facility]u [facility] for alternative Rx cancer in light of his CAD and moderately reduced EF  09/09/20 Dr Franki Monte [phone] called directly to discuss She is contemplating using Nivolumab as single Agent immunoRx This drug has a small risk of myocarditis but is certainly safer than avastin and tarceva  09/23/20 spoke with Dr Samul Dada at [facility] who is apparently involved now and starting Nivolumab every 2 weeks I told him we would plan on f/u Cardiac MRI June   Douglas [name] is very anxious. His whole routine has been changed. The oncologist at [facility] is in the middle of recommendations from [place] is always concerned about communications. His wife has had traumatic brain injury and is bipolar with mania and this makes it impossible for him to relax at home  Has now started Nivolumab/Opdivo has more congestion and MSK pains on it Noted 21-42% incidence of this side effect with drug Also  more GERD. Discussed being ok to take any 2 nd generation antihistamine with sudafed  if needed   TTE reviewed 10/04/20 EF 35-40%  MRI 12/01/20 reviewed EF 39% anterior/septal/apical scar   He continues to be overly anxious about his heart and general health. Recent CT showed no progression of disease. He has a myriad of questions most not related to his heart especially regarding nausea and gi upset With his new meds   Past Medical History:  Diagnosis Date   Allergic rhinitis    Arthritis    "all over my body" (10/16/2013)   Chronic back pain    "neck to lower back" (10/16/2013)   Compression fracture    S/P L-SPINE; pt does not recall this hx on 10/16/2013   COPD (chronic obstructive pulmonary disease) (HCC)    FVC .66   Coronary artery disease    Depression    "situational since 09/22/2013 OR"   Encounter for long-term (current) use of other medications    GERD (gastroesophageal reflux disease)    Hemoptysis    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)    History of blood transfusion 09/2013   "think so; not sure; related to big OR"   History of surgical fusion joint    DDD C-SPINE   Hyperlipidemia    Hypertension    Leiomyoma OF SKIN   INCREASED RISK RENAL CELL CA  NEEDS CT OF KIDNEYS EVERY 2 YEARS NEXT DUE  02/06/13   Melanoma  in situ ([place])    Melanoma of back ([place])    Neuromuscular disorder ([place])    Osteoarthritis    Renal calculi    Renal cell carcinoma ([place])    type 2; papillary   Tobacco use disorder    Unspecified vitamin D deficiency     Past Surgical History:  Procedure Laterality Date   ANTERIOR CERVICAL DECOMP/DISCECTOMY FUSION  1992   ANTERIOR CRUCIATE LIGAMENT REPAIR Bilateral [phone]   APPENDECTOMY  09/22/2013   CARDIAC CATHETERIZATION     CHOLECYSTECTOMY  09/22/2013   "had tumors in it"   CORONARY STENT INTERVENTION N/A 05/24/2020   Procedure: CORONARY STENT INTERVENTION;  Surgeon: Martinique, Julianah Marciel M, MD;  Location: [facility];  Service: Cardiovascular;  Laterality: N/A;   HAND LIGAMENT RECONSTRUCTION Right    HARVEST BONE GRAFT Right 1992   "hip; for neck fusion"   INGUINAL HERNIA REPAIR Right 2004   LEFT HEART CATH AND CORONARY ANGIOGRAPHY N/A 05/24/2020   Procedure: LEFT HEART CATH AND CORONARY ANGIOGRAPHY;  Surgeon: Martinique, Turquoise Esch M, MD;  Location: [facility];  Service: Cardiovascular;  Laterality: N/A;   LIGAMENT REPAIR Right ~ 2001   "little finger"   LYMPH NODE DISSECTION Right 09/22/2013   "in the area of the kidney; they were clean"   MELANOMA EXCISION  ~ 2007   "off my back"   PARTIAL NEPHRECTOMY Right 09/22/2013   removal of fascia overlying the right psoas Right 09/22/2013   RENAL ARTERY STENT Right 09/22/2013   RIGHT COLECTOMY Right 09/22/2013   ROBOTIC ASSITED PARTIAL NEPHRECTOMY  05/22/2012   Procedure: ROBOTIC ASSITED PARTIAL NEPHRECTOMY;  Surgeon: Alexis Frock, MD;  Location: [facility];  Service: Urology;  Laterality: Right;  Right Robotic Cyst Decortication and Partial Nephrectomy    SPINE SURGERY       Medications: No outpatient medications have been marked as taking for the 01/17/21 encounter (Appointment) with Douglas Hector, MD.     Allergies: Allergies  Allergen Reactions   Avelox [Moxifloxacin Hcl In Nacl] Hives, Shortness Of Breath and Swelling   Medical Adhesive Remover Dermatitis and Rash   Tape Dermatitis and Rash    Social History: The patient  reports that he has quit smoking. His smoking use included cigarettes. He has a 20.00 pack-year smoking history. He has never used smokeless tobacco. He reports current alcohol use. He reports current drug use. Drug: Marijuana.   Family History: The patient's family history includes Hypertension in his father. He was adopted.   Review of Systems: Please see the history of present illness.   All other systems are reviewed and negative.   Physical Exam: VS:  There were no vitals taken for this visit. Marland Kitchen  BMI There is  no height or weight on file to calculate BMI.  Wt Readings from Last 3 Encounters:  10/27/20 77.6 kg  10/04/20 78.5 kg  09/20/20 76.4 kg   Affect appropriate Healthy:  appears stated age 62: normal Neck supple with no adenopathy JVP normal no bruits no thyromegaly Lungs clear with no wheezing and good diaphragmatic motion Heart:  S1/S2 no murmur, no rub, gallop or click PMI normal Abdomen: benighn, BS positve, no tenderness, no AAA no bruit.  No HSM or HJR Distal pulses intact with no bruits No edema Neuro non-focal Skin warm and dry No muscular weakness   LABORATORY DATA:  EKG:   05/25/20 SR acute anterolateral ST elevation MI  Lab Results  Component Value Date  WBC 6.9 08/26/2020   HGB 12.4 (L) 08/26/2020   HCT 36.8 (L) 08/26/2020   PLT 205 08/26/2020   GLUCOSE 98 11/22/2020   CHOL 146 06/02/2020   TRIG 244 (H) 06/02/2020   HDL 50 06/02/2020   LDLDIRECT 63.0 05/01/2019   LDLCALC 57 06/02/2020   ALT 33 08/23/2020   AST 31 08/23/2020   NA 139 11/22/2020   K 4.8 11/22/2020   CL 104 11/22/2020   CREATININE 1.30 (H) 11/22/2020   BUN 28 (H) 11/22/2020   CO2 19 (L) 11/22/2020   TSH 1.376 05/24/2020   PSA 1.86 04/14/2020   INR 1.0 05/24/2020   HGBA1C 5.2 05/29/2016     BNP (last 3 results) No results for input(s): BNP in the last 8760 hours.  ProBNP (last 3 results) No results for input(s): PROBNP in the last 8760 hours.   Other Studies Reviewed Today:  Cardiac cath 05/24/20  Prox LAD lesion is 90% stenosed. Ramus lesion is 50% stenosed. Mid RCA lesion is 35% stenosed. Post intervention, there is a 0% residual stenosis. A drug-eluting stent was successfully placed using a STENT RESOLUTE ONYX 3.0X22. LV end diastolic pressure is normal.   1. Single vessel occlusive CAD involving the proximal LAD 2. Normal LVEDP 3. Successful PCI of the proximal LAD with DES x 1   Plan: DAPT for one year. Risk factor modification. Will assess LV function with  Echo.    Echo Impression 05/25/20   1. Left ventricular ejection fraction, by estimation, is 35 to 40%. The  left ventricle has moderately decreased function. The left ventricle has  no regional wall motion abnormalities. Left ventricular diastolic  parameters are consistent with Grade I  diastolic dysfunction (impaired relaxation). There is mild apical  dyskinesis. There is severe hypokinesis of the entire anterior septum, but  the basal and mid segments of the anterior wall have preserved  contractility.   2. Right ventricular systolic function is normal. The right ventricular  size is normal.   3. The mitral valve is normal in structure. No evidence of mitral valve  regurgitation. No evidence of mitral stenosis.   4. The aortic valve is normal in structure. Aortic valve regurgitation is  not visualized. No aortic stenosis is present.   5. The inferior vena cava is normal in size with greater than 50%  respiratory variability, suggesting right atrial pressure of 3 mmHg.    Assessment/Plan: 1. STEMI -  05/24/20 with one vessel LAD CAD - s/p PCI - on DAPT has caused issue with using Tarceva and avastin for his cancer as both of these are contraindicated and not recommended for use with CAD  2. ICM - chronic systolic HF - euvolemic on entresto and coreg EF 39% by MRI 12/01/20 with no evidence Of myocarditis outside anterior infarcted region   3. HLD - he is on statin - he needs to get his LFTs checked - may need to consider lipid clinic referral.   4. Elevated LFTs - resolved normal 06/02/20 likely related to acute MI  5. Hereditary leiomyomatosis and renal cell cancer with mets - Seeing Dr Samul Dada at [facility] started on Nivolumab every 2 weeks Main cardiac side effects myocarditis Plan troponin for any new chest pain or issues MRI 12/01/20 ok only gad uptake in infarct regions   Current medicines are reviewed with the patient today.  The patient does not have concerns regarding medicines  other than what has been noted above.  The following changes have been made:  See above.    No orders of the defined types were placed in this encounter.  F/U closely with oncology to address nausea meds, vaccination, f/u lab work   Disposition:   FU 6 months    Signed: Jenkins Rouge, MD  01/12/2021 4:31 PM  [facility] [address] [place], [place]  [address] Phone: [phone] Fax: [phone]

## 2021-01-12 NOTE — Telephone Encounter (Addendum)
Called patient back about his message. Patient stated he was started on a new cancer drug, Opdivo, and he is not sure if his symptoms are related to the chemo that he took yesterday. Patient stated that his HR was low at 50, and he has been having some chest discomfort like heart burn.  Informed patient that it sounds like his new chemo medication might be causing some of his symptoms. Informed patient that he is on Coreg which lowers his HR. Patient has an appointment on Monday with [name], will forward to him for advisement, until patient sees him on Monday.   Patient also stated he is also having a lot of congestion. He said his oncologist suggested he take zyrtec D. Patient has tried to pick this medication up at the store, but they have been out of it everywhere. Will see if PharmD has any advisement on any decongestant that would work with patient's medications and condition.

## 2021-01-12 NOTE — Telephone Encounter (Signed)
STAT if patient feels like he/she is going to faint   Are you dizzy now? No pt was dizzy monday  Do you feel faint or have you passed out? No  Do you have any other symptoms? Chest discomfort  near the center of his chest but not Chest Pain pulse 50, pt is a stage 4 cancer pt, pt had last chemo Tuesday   Have you checked your HR and BP (record if available)? 131/90 Pulse 66 127/85 Pulse 79 after pt had walked 1 mile.

## 2021-01-12 NOTE — Telephone Encounter (Signed)
Called patient with Pharm D recommendations. Patient stated he was already taking zyrtec, but he needed to take it with a decongestant. Pharm D stated patient could take sudafed, but he would need check his BP and make sure it does not get too high. Patient verbalized understanding.

## 2021-01-12 NOTE — Telephone Encounter (Signed)
Opdivo has reported incidence of musculoskeletal pain of 21-42%, could be contributing to his symptoms. He can take any 2nd generation antihistamine - Zyrtec, Allegra, Claritin or their generics.

## 2021-01-17 ENCOUNTER — Encounter: Payer: Self-pay | Admitting: Cardiovascular Disease

## 2021-01-17 ENCOUNTER — Other Ambulatory Visit: Payer: Self-pay

## 2021-01-17 ENCOUNTER — Ambulatory Visit (INDEPENDENT_AMBULATORY_CARE_PROVIDER_SITE_OTHER): Payer: Medicare HMO | Admitting: Cardiovascular Disease

## 2021-01-17 VITALS — BP 124/76 | HR 78 | Ht 73.0 in | Wt 166.0 lb

## 2021-01-17 DIAGNOSIS — I255 Ischemic cardiomyopathy: Secondary | ICD-10-CM

## 2021-01-17 DIAGNOSIS — D481 Neoplasm of uncertain behavior of connective and other soft tissue: Secondary | ICD-10-CM | POA: Diagnosis not present

## 2021-01-17 DIAGNOSIS — E782 Mixed hyperlipidemia: Secondary | ICD-10-CM | POA: Diagnosis not present

## 2021-01-17 NOTE — Patient Instructions (Signed)
Medication Instructions:  *If you need a refill on your cardiac medications before your next appointment, please call your pharmacy*   Lab Work: If you have labs (blood work) drawn today and your tests are completely normal, you will receive your results only by: MyChart Message (if you have MyChart) OR A paper copy in the mail If you have any lab test that is abnormal or we need to change your treatment, we will call you to review the results.  Follow-Up: At [facility], you and your health needs are our priority.  As part of our continuing mission to provide you with exceptional heart care, we have created designated Provider Care Teams.  These Care Teams include your primary Cardiologist (physician) and Advanced Practice Providers (APPs -  Physician Assistants and Nurse Practitioners) who all work together to provide you with the care you need, when you need it.  We recommend signing up for the patient portal called "MyChart".  Sign up information is provided on this After Visit Summary.  MyChart is used to connect with patients for Virtual Visits (Telemedicine).  Patients are able to view lab/test results, encounter notes, upcoming appointments, etc.  Non-urgent messages can be sent to your provider as well.   To learn more about what you can do with MyChart, go to [email or web address].    Your next appointment:   6 month(s)  The format for your next appointment:   In Person  Provider:   You may see Peter Nishan, MD or one of the following Advanced Practice Providers on your designated Care Team:   Laura Ingold, NP  

## 2021-01-18 DIAGNOSIS — C649 Malignant neoplasm of unspecified kidney, except renal pelvis: Secondary | ICD-10-CM | POA: Diagnosis not present

## 2021-01-18 DIAGNOSIS — Z5111 Encounter for antineoplastic chemotherapy: Secondary | ICD-10-CM | POA: Diagnosis not present

## 2021-01-18 DIAGNOSIS — C7951 Secondary malignant neoplasm of bone: Secondary | ICD-10-CM | POA: Diagnosis not present

## 2021-01-19 ENCOUNTER — Encounter: Payer: Self-pay | Admitting: Family Medicine

## 2021-01-19 DIAGNOSIS — G893 Neoplasm related pain (acute) (chronic): Secondary | ICD-10-CM

## 2021-01-19 DIAGNOSIS — C641 Malignant neoplasm of right kidney, except renal pelvis: Secondary | ICD-10-CM

## 2021-01-19 MED ORDER — HYDROCODONE-ACETAMINOPHEN 7.5-325 MG PO TABS: 2.0000 | ORAL_TABLET | ORAL | 0 refills | Status: DC

## 2021-01-26 DIAGNOSIS — M19012 Primary osteoarthritis, left shoulder: Secondary | ICD-10-CM | POA: Diagnosis not present

## 2021-02-01 DIAGNOSIS — H538 Other visual disturbances: Secondary | ICD-10-CM | POA: Diagnosis not present

## 2021-02-01 DIAGNOSIS — Z7983 Long term (current) use of bisphosphonates: Secondary | ICD-10-CM | POA: Diagnosis not present

## 2021-02-01 DIAGNOSIS — J329 Chronic sinusitis, unspecified: Secondary | ICD-10-CM | POA: Diagnosis not present

## 2021-02-01 DIAGNOSIS — Z79899 Other long term (current) drug therapy: Secondary | ICD-10-CM | POA: Diagnosis not present

## 2021-02-01 DIAGNOSIS — C7951 Secondary malignant neoplasm of bone: Secondary | ICD-10-CM | POA: Diagnosis not present

## 2021-02-01 DIAGNOSIS — Z1509 Genetic susceptibility to other malignant neoplasm: Secondary | ICD-10-CM | POA: Diagnosis not present

## 2021-02-01 DIAGNOSIS — L308 Other specified dermatitis: Secondary | ICD-10-CM | POA: Diagnosis not present

## 2021-02-01 DIAGNOSIS — D580 Hereditary spherocytosis: Secondary | ICD-10-CM | POA: Diagnosis not present

## 2021-02-01 DIAGNOSIS — D630 Anemia in neoplastic disease: Secondary | ICD-10-CM | POA: Diagnosis not present

## 2021-02-01 DIAGNOSIS — R69 Illness, unspecified: Secondary | ICD-10-CM | POA: Diagnosis not present

## 2021-02-02 DIAGNOSIS — H539 Unspecified visual disturbance: Secondary | ICD-10-CM | POA: Diagnosis not present

## 2021-02-02 DIAGNOSIS — Z1509 Genetic susceptibility to other malignant neoplasm: Secondary | ICD-10-CM | POA: Diagnosis not present

## 2021-02-02 DIAGNOSIS — C649 Malignant neoplasm of unspecified kidney, except renal pelvis: Secondary | ICD-10-CM | POA: Diagnosis not present

## 2021-02-02 DIAGNOSIS — R9082 White matter disease, unspecified: Secondary | ICD-10-CM | POA: Diagnosis not present

## 2021-02-09 DIAGNOSIS — H33322 Round hole, left eye: Secondary | ICD-10-CM | POA: Diagnosis not present

## 2021-02-09 DIAGNOSIS — N189 Chronic kidney disease, unspecified: Secondary | ICD-10-CM | POA: Diagnosis not present

## 2021-02-09 DIAGNOSIS — H43813 Vitreous degeneration, bilateral: Secondary | ICD-10-CM | POA: Diagnosis not present

## 2021-02-09 DIAGNOSIS — H25813 Combined forms of age-related cataract, bilateral: Secondary | ICD-10-CM | POA: Diagnosis not present

## 2021-02-09 DIAGNOSIS — H04123 Dry eye syndrome of bilateral lacrimal glands: Secondary | ICD-10-CM | POA: Diagnosis not present

## 2021-02-10 ENCOUNTER — Ambulatory Visit (INDEPENDENT_AMBULATORY_CARE_PROVIDER_SITE_OTHER): Payer: Medicare HMO | Admitting: Ophthalmology

## 2021-02-10 ENCOUNTER — Other Ambulatory Visit: Payer: Self-pay

## 2021-02-10 ENCOUNTER — Encounter (INDEPENDENT_AMBULATORY_CARE_PROVIDER_SITE_OTHER): Payer: Self-pay | Admitting: Ophthalmology

## 2021-02-10 DIAGNOSIS — H35033 Hypertensive retinopathy, bilateral: Secondary | ICD-10-CM

## 2021-02-10 DIAGNOSIS — H35372 Puckering of macula, left eye: Secondary | ICD-10-CM | POA: Diagnosis not present

## 2021-02-10 DIAGNOSIS — H33322 Round hole, left eye: Secondary | ICD-10-CM

## 2021-02-10 DIAGNOSIS — H3581 Retinal edema: Secondary | ICD-10-CM | POA: Diagnosis not present

## 2021-02-10 DIAGNOSIS — H25813 Combined forms of age-related cataract, bilateral: Secondary | ICD-10-CM

## 2021-02-10 DIAGNOSIS — H3322 Serous retinal detachment, left eye: Secondary | ICD-10-CM | POA: Diagnosis not present

## 2021-02-10 DIAGNOSIS — I10 Essential (primary) hypertension: Secondary | ICD-10-CM

## 2021-02-10 DIAGNOSIS — H35412 Lattice degeneration of retina, left eye: Secondary | ICD-10-CM | POA: Diagnosis not present

## 2021-02-10 DIAGNOSIS — H35342 Macular cyst, hole, or pseudohole, left eye: Secondary | ICD-10-CM | POA: Diagnosis not present

## 2021-02-10 MED ORDER — PREDNISOLONE ACETATE 1 % OP SUSP: 1.0000 [drp] | Freq: Four times a day (QID) | OPHTHALMIC | 0 refills | Status: AC

## 2021-02-10 NOTE — Progress Notes (Addendum)
[facility]  02/10/2021     CHIEF COMPLAINT Patient presents for Retina Evaluation   HISTORY OF PRESENT ILLNESS: Douglas [name] is a 65 y.o. male who presents to the clinic today for:   HPI     Retina Evaluation   In left eye.  This started 1 day ago.  Duration of 1 day.  I, the attending physician,  performed the HPI with the patient and updated documentation appropriately.        Comments   New patient ret eval referred by Dr. Wyatt Portela for retinal tear OS. Pt states for the past 2 weeks he has experienced a change in his vision. He reports that his vision has been disorienting, feels like his vision hasn't been following him, neurological difficulties. Pt has stage 4 kidney cancer, on immunotherapy treatment. He did have an MRI that cleared any neurological issues and was sent to see Dr. Katy Fitch. Pt states Dr. Katy Fitch told him his retina could detach and he needed to be seen. No floaters or flashes of light reported currently. He reports seeing floaters 4-5 years ago. Pt did have a treatment of Avastin IV for his kidney cancer which caused him to have a heart attack.       Last edited by Bernarda Caffey, MD on 02/10/2021 10:47 AM.    States: Had Avastin infused for cancer treatments.  Vision disorienting.  Told oncologist last week about this problem.   Oncologist didn't think his vision problems was coming from his new drug.  MRI clean.  Patient states he has had other side effects from this medication.   Referring physician: Debbra Riding, MD [address] [place],  [place] [address]  HISTORICAL INFORMATION:   Selected notes from the MEDICAL RECORD NUMBER Referred by Dr. Wyatt Portela for concern of retinal tears LEE:  Ocular Hx- PMH-    CURRENT MEDICATIONS: Current Outpatient Medications (Ophthalmic Drugs)  Medication Sig   prednisoLONE acetate (PRED FORTE) 1 % ophthalmic suspension Place 1 drop into the left eye 4 (four) times  daily for 7 days.   No current facility-administered medications for this visit. (Ophthalmic Drugs)   Current Outpatient Medications (Other)  Medication Sig   aspirin EC 81 MG tablet Take 1 tablet (81 mg total) by mouth daily. Swallow whole.   B Complex Vitamins (B COMPLEX 100 PO) Take 100 mg by mouth daily.   calcium carbonate (TUMS - DOSED IN MG ELEMENTAL CALCIUM) 500 MG chewable tablet Chew 500 mg by mouth 3 (three) times daily as needed for indigestion or heartburn.   carvedilol (COREG) 3.125 MG tablet TAKE 1 TABLET (3.125 MG TOTAL) BY MOUTH 2 (TWO) TIMES DAILY WITH A MEAL.   cetirizine (ZYRTEC) 10 MG tablet Take 5 mg by mouth daily.    cholecalciferol (VITAMIN D3) 25 MCG (1000 UNIT) tablet Take 2,000 Units by mouth daily.   clopidogrel (PLAVIX) 75 MG tablet Take 1 tablet (75 mg total) by mouth daily.   denosumab (XGEVA) 120 MG/1.7ML SOLN injection Inject 120 mg into the skin every 30 (thirty) days.    DULoxetine (CYMBALTA) 20 MG capsule TAKE ONE CAPSULE BY MOUTH DAILY   HYDROcodone-acetaminophen (NORCO) 7.5-325 MG tablet Take 2 tablets by mouth See admin instructions. Take 2 every 4 hours through out the day until taking ativan at bedtime, do not mix   loperamide (IMODIUM A-D) 2 MG tablet Take 2 mg by mouth 4 (four) times daily as needed for  diarrhea or loose stools.   LORazepam (ATIVAN) 1 MG tablet Take 0.5-1 tablets (0.5-1 mg total) by mouth at bedtime as needed for sleep. TAKE ONE HALF OR ONE TABLET BY MOUTH AT BEDTIME IF NEEDED FOR SLEEP, DO NOT TAKE WITH PAIN MEDS   Multiple Vitamins-Minerals (CENTRUM SILVER 50+MEN PO) Take 1 tablet by mouth daily.   nitroGLYCERIN (NITROSTAT) 0.4 MG SL tablet Place 1 tablet (0.4 mg total) under the tongue every 5 (five) minutes x 3 doses as needed for chest pain. Please dispense #25 x 4 bottle   Nivolumab (OPDIVO IV) Inject into the vein every 14 (fourteen) days.   ondansetron (ZOFRAN-ODT) 4 MG disintegrating tablet TAKE 1 TABLET BY MOUTH SUBLINGUALLY  EVERY 8 HOURS AS NEEDED FOR NAUSEA OR VOMITING   pantoprazole (PROTONIX) 20 MG tablet Take 1 tablet (20 mg total) by mouth daily.   rosuvastatin (CRESTOR) 10 MG tablet TAKE 1 TABLET BY MOUTH EVERY DAY   sacubitril-valsartan (ENTRESTO) 24-26 MG Take 1 tablet by mouth 2 (two) times daily.   traZODone (DESYREL) 50 MG tablet TAKE 0.5-1 TABLETS BY MOUTH AT BEDTIME AS NEEDED FOR SLEEP.   UNABLE TO FIND Take 1 tablet by mouth daily. Med Name: Calcium 600/D-3 300 2 daily   vitamin C (ASCORBIC ACID) 500 MG tablet Take 500 mg by mouth daily.   No current facility-administered medications for this visit. (Other)   REVIEW OF SYSTEMS: ROS   Positive for: Genitourinary, Cardiovascular, Eyes Negative for: Constitutional, Gastrointestinal, Neurological, Skin, Musculoskeletal, HENT, Endocrine, Respiratory, Psychiatric, Allergic/Imm, Heme/Lymph Last edited by Kingsley Spittle, COT on 02/10/2021  8:24 AM.     ALLERGIES Allergies  Allergen Reactions   Avelox [Moxifloxacin Hcl In Nacl] Hives, Shortness Of Breath and Swelling   Medical Adhesive Remover Dermatitis and Rash   Tape Dermatitis and Rash    PAST MEDICAL HISTORY Past Medical History:  Diagnosis Date   Allergic rhinitis    Arthritis    "all over my body" (10/16/2013)   Chronic back pain    "neck to lower back" (10/16/2013)   Compression fracture    S/P L-SPINE; pt does not recall this hx on 10/16/2013   COPD (chronic obstructive pulmonary disease) (HCC)    FVC .66   Coronary artery disease    Depression    "situational since 09/22/2013 OR"   Encounter for long-term (current) use of other medications    GERD (gastroesophageal reflux disease)    Hemoptysis    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)    History of blood transfusion 09/2013   "think so; not sure; related to big OR"   History of surgical fusion joint    DDD C-SPINE   Hyperlipidemia    Hypertension    Leiomyoma OF SKIN   INCREASED RISK RENAL CELL CA  NEEDS  CT OF KIDNEYS EVERY 2 YEARS NEXT DUE  02/06/13   Melanoma in situ ([facility])    Melanoma of back ([facility])    Neuromuscular disorder ([facility])    Osteoarthritis    Renal calculi    Renal cell carcinoma ([facility])    type 2; papillary   Tobacco use disorder    Unspecified vitamin D deficiency    Past Surgical History:  Procedure Laterality Date   ANTERIOR CERVICAL DECOMP/DISCECTOMY FUSION  1992   ANTERIOR CRUCIATE LIGAMENT REPAIR Bilateral [phone]   APPENDECTOMY  09/22/2013   CARDIAC CATHETERIZATION     CHOLECYSTECTOMY  09/22/2013   "had tumors in it"   CORONARY STENT  INTERVENTION N/A 05/24/2020   Procedure: CORONARY STENT INTERVENTION;  Surgeon: Martinique, Peter M, MD;  Location: [facility];  Service: Cardiovascular;  Laterality: N/A;   HAND LIGAMENT RECONSTRUCTION Right    HARVEST BONE GRAFT Right 1992   "hip; for neck fusion"   INGUINAL HERNIA REPAIR Right 2004   LEFT HEART CATH AND CORONARY ANGIOGRAPHY N/A 05/24/2020   Procedure: LEFT HEART CATH AND CORONARY ANGIOGRAPHY;  Surgeon: Martinique, Peter M, MD;  Location: [facility];  Service: Cardiovascular;  Laterality: N/A;   LIGAMENT REPAIR Right ~ 2001   "little finger"   LYMPH NODE DISSECTION Right 09/22/2013   "in the area of the kidney; they were clean"   MELANOMA EXCISION  ~ 2007   "off my back"   PARTIAL NEPHRECTOMY Right 09/22/2013   removal of fascia overlying the right psoas Right 09/22/2013   RENAL ARTERY STENT Right 09/22/2013   RIGHT COLECTOMY Right 09/22/2013   ROBOTIC ASSITED PARTIAL NEPHRECTOMY  05/22/2012   Procedure: ROBOTIC ASSITED PARTIAL NEPHRECTOMY;  Surgeon: Alexis Frock, MD;  Location: [facility];  Service: Urology;  Laterality: Right;  Right Robotic Cyst Decortication and Partial Nephrectomy    SPINE SURGERY      FAMILY HISTORY Family History  Adopted: Yes  Problem Relation Age of Onset   Blindness Mother    Glaucoma Mother    Hypertension Father     SOCIAL HISTORY Social History    Tobacco Use   Smoking status: Former    Packs/day: 0.50    Years: 40.00    Pack years: 20.00    Types: Cigarettes   Smokeless tobacco: Never  Vaping Use   Vaping Use: Never used  Substance Use Topics   Alcohol use: Yes    Alcohol/week: 0.0 standard drinks    Comment: 10/16/2013 "2-3 beers daily generally; nothing last month or so"   Drug use: Yes    Types: Marijuana         OPHTHALMIC EXAM:  Base Eye Exam     Visual Acuity (Snellen - Linear)       Right Left   Dist    -1   Dist cc 20/20 20/25 -2   Dist ph cc  NI         Tonometry (Tonopen, 8:42 AM)       Right Left   Pressure 7 7         Pupils       Dark Light Shape React APD   Right 3 4 Round Minimal None   Left 3 4 Round Minimal None         Visual Fields (Counting fingers)       Left Right    Full Full         Extraocular Movement       Right Left    Full, Ortho Full, Ortho         Neuro/Psych     Oriented x3: Yes   Mood/Affect: Normal         Dilation     Both eyes: 1.0% Mydriacyl, 2.5% Phenylephrine @ 8:43 AM           Slit Lamp and Fundus Exam     External Exam       Right Left   External Normal Normal         Slit Lamp Exam       Right Left   Lids/Lashes Mild Meibomian gland dysfunction, mild Telangiectasia Mild Meibomian gland  dysfunction, mild Telangiectasia   Conjunctiva/Sclera White and quiet, Mild temp Pinguecula White and quiet, Mild temp Pinguecula   Cornea Mild , Punctate epithelial erosions Mild , Punctate epithelial erosions   Anterior Chamber Deep and quiet Deep and quiet   Iris Round and dilated Round and dilated   Lens 2+ Nuclear sclerosis, 2-3+ Cortical cataract 2+ Nuclear sclerosis, 2-3+ Cortical cataract   Vitreous Vitreous syneresis, Posterior vitreous detachment Vitreous syneresis, no pigment, Posterior vitreous detachment, Vitreous condensations         Fundus Exam       Right Left   Disc Pink and sharp, Compact, Temporal PPA  Pink and sharp, Compact   C/D Ratio 0.2 0.2   Macula Flat, Blunted foveal reflex, no Heme Flat, Blunted foveal reflex, ERM, mild cystic changes, no Heme   Vessels Tortuous, Copper wiring, Vascular attenuation Tortuous, Copper wiring, Vascular attenuation   Periphery Attached, No heme Pigmented lattice inferiorly from 0500 to 0715 with large atrophic holes, positive SRF            Refraction     Wearing Rx       Sphere Cylinder Axis   Right -2.75 +0.75 029   Left -2.75 +0.75 146         Manifest Refraction       Sphere Cylinder Axis Dist VA   Right -2.75 +1.00 030 20/20   Left -3.50 +1.75 145 20/25+1            IMAGING AND PROCEDURES  Imaging and Procedures for 02/10/2021  OCT, Retina - OU - Both Eyes       Right Eye Quality was good. Central Foveal Thickness: 255. Progression has no prior data. Findings include normal foveal contour, no IRF, no SRF.   Left Eye Quality was good. Central Foveal Thickness: 307. Progression has no prior data. Findings include abnormal foveal contour, no SRF, macular pucker, epiretinal membrane, lamellar hole, intraretinal fluid (Mild ERM with lamellar hole and central cystic changes).   Notes *Images captured and stored on drive  Diagnosis / Impression:  OD: NFP; no IRF/SRF  OS: Mild ERM with lamellar hole and central cystic changes  Clinical management:  See below  Abbreviations: NFP - Normal foveal profile. CME - cystoid macular edema. PED - pigment epithelial detachment. IRF - intraretinal fluid. SRF - subretinal fluid. EZ - ellipsoid zone. ERM - epiretinal membrane. ORA - outer retinal atrophy. ORT - outer retinal tubulation. SRHM - subretinal hyper-reflective material. IRHM - intraretinal hyper-reflective material        Repair Retinal Detach, Photocoag - OS - Left Eye       Time Out Confirmed correct patient, procedure, site, and patient consented.   Anesthesia Topical anesthesia was used.   Notes LASER  PROCEDURE NOTE  Procedure:  Barrier laser retinopexy using slit lamp laser, LEFT eye   Diagnosis:   Lattice degeneration with atrophic retinal holes and surrounding SRF / focal RD, LEFT eye                     Pigmented lattice spanning 5-715 oclock,  anterior to equator   Surgeon: Bernarda Caffey, MD, PhD  Anesthesia: Topical  Informed consent obtained, operative eye marked, and time out performed prior to initiation of laser.   Laser settings:  Lumenis [id number] laser, slit lamp Lens: Mainster PRP 165 Power: 300 mW Spot size: 200 microns Duration: 30 msec  # spots: 279  Placement of laser: Using a Mainster PRP 165 contact  lens at the slit lamp, laser was placed in three confluent rows posterior to pigmented lattice with atrophic holes from 5-715 oclock anterior to equator with extension to ora.  Complications: None.  Patient tolerated the procedure well and received written and verbal post-procedure care information/education.            ASSESSMENT/PLAN:    ICD-10-CM   1. Lattice degeneration of left retina  H35.412 Repair Retinal Detach, Photocoag - OS - Left Eye    2. Retinal holes, left  H33.322 Repair Retinal Detach, Photocoag - OS - Left Eye    3. Retinal detachment, left  H33.22 Repair Retinal Detach, Photocoag - OS - Left Eye    4. Retinal edema  H35.81 OCT, Retina - OU - Both Eyes    5. Epiretinal membrane (ERM) of left eye  H35.372     6. Lamellar macular hole of left eye  H35.342     7. Essential hypertension  I10     8. Hypertensive retinopathy of both eyes  H35.033     9. Combined forms of age-related cataract of both eyes  H25.813       1-4. Lattice degeneration w/ atrophic holes and +SRF / focal RD, OS -  exam today shows- Pigmented lattice inferiorly from 0500 to 0715 with large atrophic holes, positive SRF  - OCT shows- Mild ERM with lamellar hole, central cystic changes - discussed findings, prognosis, and treatment options including  observation - recommend laser retinopexy OS today, 8.25.22 - pt wishes to proceed with laser - RBA of procedure discussed, questions answered - informed consent obtained and signed - see procedure note - start PF QID OS x7 days - f/u in 1-2 wks, sooner prn -- POV    5,6. ERM w/ lamellar macular hole OS - The natural history, anatomy, potential for loss of vision, and treatment options including vitrectomy techniques and the complications of endophthalmitis, retinal detachment, vitreous hemorrhage, cataract progression and permanent vision loss discussed with the patient. - mild ERM - BCVA 20/25 - asymptomatic, no metamorphopsia - no indication for surgery at this time - monitor for now - f/u 3 mos -- DFE/OCT  7,8. Hypertensive retinopathy OU - discussed importance of tight BP control - monitor   9. Mixed Cataract, OU - The symptoms of cataract, surgical options, and treatments and risks were discussed with patient. - discussed diagnosis and progression  Ophthalmic Meds Ordered this visit:  Meds ordered this encounter  Medications   prednisoLONE acetate (PRED FORTE) 1 % ophthalmic suspension    Sig: Place 1 drop into the left eye 4 (four) times daily for 7 days.    Dispense:  10 mL    Refill:  0       Return for Sept 2, 2022 for DFE, OCT.  There are no Patient Instructions on file for this visit.   Explained the diagnoses, plan, and follow up with the patient and they expressed understanding.  Patient expressed understanding of the importance of proper follow up care.   This document serves as a record of services personally performed by Gardiner Sleeper, MD, PhD. It was created on their behalf by San Jetty. Owens Shark, OA an ophthalmic technician. The creation of this record is the provider's dictation and/or activities during the visit.    Electronically signed by: San Jetty. Owens Shark, [place] 08.25.2022 3:50 PM  Gardiner Sleeper, M.D., Ph.D. Diseases & Surgery of the Retina and  Progress  I  have reviewed the above documentation for accuracy and completeness, and I agree with the above. Gardiner Sleeper, M.D., Ph.D. 02/15/21 3:50 PM   Abbreviations: M myopia (nearsighted); A astigmatism; H hyperopia (farsighted); P presbyopia; Mrx spectacle prescription;  CTL contact lenses; OD right eye; OS left eye; OU both eyes  XT exotropia; ET esotropia; PEK punctate epithelial keratitis; PEE punctate epithelial erosions; DES dry eye syndrome; MGD meibomian gland dysfunction; ATs artificial tears; PFAT's preservative free artificial tears; [place] nuclear sclerotic cataract; PSC posterior subcapsular cataract; ERM epi-retinal membrane; PVD posterior vitreous detachment; RD retinal detachment; DM diabetes mellitus; DR diabetic retinopathy; NPDR non-proliferative diabetic retinopathy; PDR proliferative diabetic retinopathy; CSME clinically significant macular edema; DME diabetic macular edema; dbh dot blot hemorrhages; CWS cotton wool spot; POAG primary open angle glaucoma; C/D cup-to-disc ratio; HVF humphrey visual field; GVF goldmann visual field; OCT optical coherence tomography; IOP intraocular pressure; BRVO Branch retinal vein occlusion; CRVO central retinal vein occlusion; CRAO central retinal artery occlusion; BRAO branch retinal artery occlusion; RT retinal tear; SB scleral buckle; PPV pars plana vitrectomy; VH Vitreous hemorrhage; PRP panretinal laser photocoagulation; IVK intravitreal kenalog; VMT vitreomacular traction; MH Macular hole;  NVD neovascularization of the disc; NVE neovascularization elsewhere; AREDS age related eye disease study; ARMD age related macular degeneration; POAG primary open angle glaucoma; EBMD epithelial/anterior basement membrane dystrophy; ACIOL anterior chamber intraocular lens; IOL intraocular lens; PCIOL posterior chamber intraocular lens; Phaco/IOL phacoemulsification with intraocular lens placement; [place] photorefractive  keratectomy; LASIK laser assisted in situ keratomileusis; HTN hypertension; DM diabetes mellitus; COPD chronic obstructive pulmonary disease

## 2021-02-15 DIAGNOSIS — Z5112 Encounter for antineoplastic immunotherapy: Secondary | ICD-10-CM | POA: Diagnosis not present

## 2021-02-15 DIAGNOSIS — D630 Anemia in neoplastic disease: Secondary | ICD-10-CM | POA: Diagnosis not present

## 2021-02-15 DIAGNOSIS — L270 Generalized skin eruption due to drugs and medicaments taken internally: Secondary | ICD-10-CM | POA: Diagnosis not present

## 2021-02-15 DIAGNOSIS — C649 Malignant neoplasm of unspecified kidney, except renal pelvis: Secondary | ICD-10-CM | POA: Diagnosis not present

## 2021-02-15 DIAGNOSIS — D4100 Neoplasm of uncertain behavior of unspecified kidney: Secondary | ICD-10-CM | POA: Diagnosis not present

## 2021-02-15 DIAGNOSIS — L309 Dermatitis, unspecified: Secondary | ICD-10-CM | POA: Diagnosis not present

## 2021-02-15 NOTE — Progress Notes (Signed)
[facility] Note  02/18/2021     CHIEF COMPLAINT Patient presents for Retina Follow Up   HISTORY OF PRESENT ILLNESS: Douglas [name] is a 65 y.o. male who presents to the clinic today for:   HPI     Retina Follow Up   Patient presents with  Other.  In left eye.  Duration of 5.5 weeks.  Since onset it is stable.  I, the attending physician,  performed the HPI with the patient and updated documentation appropriately.        Comments   5-6 week follow up s/p Retinopexy OS for lattice degeneration.  Doing well.  Denies new floaters, FOLs, or decrease in vision.       Last edited by Bernarda Caffey, MD on 02/19/2021  5:40 PM.    Pt reports everything went well after the laser.  OD frequently feels dry.  Referring physician: Debbra Riding, MD [address] [place],  [place] [address]  HISTORICAL INFORMATION:   Selected notes from the MEDICAL RECORD NUMBER Referred by Dr. Wyatt Portela for concern of retinal tears   CURRENT MEDICATIONS: No current outpatient medications on file. (Ophthalmic Drugs)   No current facility-administered medications for this visit. (Ophthalmic Drugs)   Current Outpatient Medications (Other)  Medication Sig   aspirin EC 81 MG tablet Take 1 tablet (81 mg total) by mouth daily. Swallow whole.   B Complex Vitamins (B COMPLEX 100 PO) Take 100 mg by mouth daily.   calcium carbonate (TUMS - DOSED IN MG ELEMENTAL CALCIUM) 500 MG chewable tablet Chew 500 mg by mouth 3 (three) times daily as needed for indigestion or heartburn.   carvedilol (COREG) 3.125 MG tablet TAKE 1 TABLET (3.125 MG TOTAL) BY MOUTH 2 (TWO) TIMES DAILY WITH A MEAL.   cetirizine (ZYRTEC) 10 MG tablet Take 5 mg by mouth daily.    cholecalciferol (VITAMIN D3) 25 MCG (1000 UNIT) tablet Take 2,000 Units by mouth daily.   clopidogrel (PLAVIX) 75 MG tablet Take 1 tablet (75 mg total) by mouth daily.   denosumab (XGEVA) 120 MG/1.7ML SOLN injection Inject 120 mg  into the skin every 30 (thirty) days.    DULoxetine (CYMBALTA) 20 MG capsule TAKE ONE CAPSULE BY MOUTH DAILY   HYDROcodone-acetaminophen (NORCO) 7.5-325 MG tablet Take 2 tablets by mouth See admin instructions. Take 2 every 4 hours through out the day until taking ativan at bedtime, do not mix   loperamide (IMODIUM A-D) 2 MG tablet Take 2 mg by mouth 4 (four) times daily as needed for diarrhea or loose stools.   LORazepam (ATIVAN) 1 MG tablet Take 0.5-1 tablets (0.5-1 mg total) by mouth at bedtime as needed for sleep. TAKE ONE HALF OR ONE TABLET BY MOUTH AT BEDTIME IF NEEDED FOR SLEEP, DO NOT TAKE WITH PAIN MEDS   Multiple Vitamins-Minerals (CENTRUM SILVER 50+MEN PO) Take 1 tablet by mouth daily.   nitroGLYCERIN (NITROSTAT) 0.4 MG SL tablet Place 1 tablet (0.4 mg total) under the tongue every 5 (five) minutes x 3 doses as needed for chest pain. Please dispense #25 x 4 bottle   Nivolumab (OPDIVO IV) Inject into the vein every 14 (fourteen) days.   ondansetron (ZOFRAN-ODT) 4 MG disintegrating tablet TAKE 1 TABLET BY MOUTH SUBLINGUALLY EVERY 8 HOURS AS NEEDED FOR NAUSEA OR VOMITING   pantoprazole (PROTONIX) 20 MG tablet Take 1 tablet (20 mg total) by mouth daily.   rosuvastatin (CRESTOR) 10 MG tablet TAKE 1 TABLET BY  MOUTH EVERY DAY   sacubitril-valsartan (ENTRESTO) 24-26 MG Take 1 tablet by mouth 2 (two) times daily.   traZODone (DESYREL) 50 MG tablet TAKE 0.5-1 TABLETS BY MOUTH AT BEDTIME AS NEEDED FOR SLEEP.   UNABLE TO FIND Take 1 tablet by mouth daily. Med Name: Calcium 600/D-3 300 2 daily   vitamin C (ASCORBIC ACID) 500 MG tablet Take 500 mg by mouth daily.   No current facility-administered medications for this visit. (Other)   REVIEW OF SYSTEMS: ROS   Positive for: Gastrointestinal, Musculoskeletal, Cardiovascular, Eyes, Respiratory, Heme/Lymph Negative for: Constitutional, Neurological, Skin, Genitourinary, HENT, Endocrine, Psychiatric, Allergic/Imm Last edited by Leonie Douglas, COA on  02/18/2021  8:42 AM.      ALLERGIES Allergies  Allergen Reactions   Avelox [Moxifloxacin Hcl In Nacl] Hives, Shortness Of Breath and Swelling   Medical Adhesive Remover Dermatitis and Rash   Tape Dermatitis and Rash    PAST MEDICAL HISTORY Past Medical History:  Diagnosis Date   Allergic rhinitis    Arthritis    "all over my body" (10/16/2013)   Chronic back pain    "neck to lower back" (10/16/2013)   Compression fracture    S/P L-SPINE; pt does not recall this hx on 10/16/2013   COPD (chronic obstructive pulmonary disease) (HCC)    FVC .66   Coronary artery disease    Depression    "situational since 09/22/2013 OR"   Encounter for long-term (current) use of other medications    GERD (gastroesophageal reflux disease)    Hemoptysis    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)    History of blood transfusion 09/2013   "think so; not sure; related to big OR"   History of surgical fusion joint    DDD C-SPINE   Hyperlipidemia    Hypertension    Leiomyoma OF SKIN   INCREASED RISK RENAL CELL CA  NEEDS CT OF KIDNEYS EVERY 2 YEARS NEXT DUE  02/06/13   Melanoma in situ ([place])    Melanoma of back ([place])    Neuromuscular disorder ([place])    Osteoarthritis    Renal calculi    Renal cell carcinoma ([place])    type 2; papillary   Tobacco use disorder    Unspecified vitamin D deficiency    Past Surgical History:  Procedure Laterality Date   ANTERIOR CERVICAL DECOMP/DISCECTOMY FUSION  1992   ANTERIOR CRUCIATE LIGAMENT REPAIR Bilateral [id number]   APPENDECTOMY  09/22/2013   CARDIAC CATHETERIZATION     CHOLECYSTECTOMY  09/22/2013   "had tumors in it"   CORONARY STENT INTERVENTION N/A 05/24/2020   Procedure: CORONARY STENT INTERVENTION;  Surgeon: Martinique, Peter M, MD;  Location: [facility];  Service: Cardiovascular;  Laterality: N/A;   HAND LIGAMENT RECONSTRUCTION Right    HARVEST BONE GRAFT Right 1992   "hip; for neck fusion"   INGUINAL HERNIA REPAIR  Right 2004   LEFT HEART CATH AND CORONARY ANGIOGRAPHY N/A 05/24/2020   Procedure: LEFT HEART CATH AND CORONARY ANGIOGRAPHY;  Surgeon: Martinique, Peter M, MD;  Location: [facility];  Service: Cardiovascular;  Laterality: N/A;   LIGAMENT REPAIR Right ~ 2001   "little finger"   LYMPH NODE DISSECTION Right 09/22/2013   "in the area of the kidney; they were clean"   MELANOMA EXCISION  ~ 2007   "off my back"   PARTIAL NEPHRECTOMY Right 09/22/2013   removal of fascia overlying the right psoas Right 09/22/2013   RENAL ARTERY STENT Right 09/22/2013  RIGHT COLECTOMY Right 09/22/2013   ROBOTIC ASSITED PARTIAL NEPHRECTOMY  05/22/2012   Procedure: ROBOTIC ASSITED PARTIAL NEPHRECTOMY;  Surgeon: Alexis Frock, MD;  Location: [facility];  Service: Urology;  Laterality: Right;  Right Robotic Cyst Decortication and Partial Nephrectomy    SPINE SURGERY      FAMILY HISTORY Family History  Adopted: Yes  Problem Relation Age of Onset   Blindness Mother    Glaucoma Mother    Hypertension Father     SOCIAL HISTORY Social History   Tobacco Use   Smoking status: Former    Packs/day: 0.50    Years: 40.00    Pack years: 20.00    Types: Cigarettes   Smokeless tobacco: Never  Vaping Use   Vaping Use: Never used  Substance Use Topics   Alcohol use: Yes    Alcohol/week: 0.0 standard drinks    Comment: 10/16/2013 "2-3 beers daily generally; nothing last month or so"   Drug use: Yes    Types: Marijuana         OPHTHALMIC EXAM:  Base Eye Exam     Visual Acuity (Snellen - Linear)       Right Left   Dist cc 20/20 -2 20/30 +1   Dist ph cc  20/25 +1    Correction: Glasses         Tonometry (Tonopen, 8:37 AM)       Right Left   Pressure 14 14         Pupils       Dark Light Shape React APD   Right 4 3 Round Brisk None   Left 4 3 Round Brisk None         Visual Fields (Counting fingers)       Left Right    Full Full         Extraocular Movement       Right Left     Full Full         Neuro/Psych     Oriented x3: Yes   Mood/Affect: Normal         Dilation     Both eyes: 1.0% Mydriacyl, 2.5% Phenylephrine @ 8:38 AM           Slit Lamp and Fundus Exam     External Exam       Right Left   External Normal Normal         Slit Lamp Exam       Right Left   Lids/Lashes Mild Meibomian gland dysfunction, mild Telangiectasia Mild Meibomian gland dysfunction, mild Telangiectasia   Conjunctiva/Sclera White and quiet, Mild temp Pinguecula White and quiet, Mild temp Pinguecula   Cornea Mild , Punctate epithelial erosions Mild , Punctate epithelial erosions, arcus   Anterior Chamber Deep and quiet Deep and quiet   Iris Round and dilated Round and dilated   Lens 2+ Nuclear sclerosis, 2-3+ Cortical cataract 2+ Nuclear sclerosis, 2-3+ Cortical cataract   Vitreous Vitreous syneresis, Posterior vitreous detachment Vitreous syneresis, no pigment, Posterior vitreous detachment, Vitreous condensations         Fundus Exam       Right Left   Disc Pink and sharp, Compact, Temporal PPA Pink and sharp, Compact   C/D Ratio 0.2 0.2   Macula Flat, Blunted foveal reflex, no Heme Flat, Blunted foveal reflex, ERM, mild cystic changes, no Heme   Vessels Tortuous, Copper wiring, Vascular attenuation Tortuous, Copper wiring, Vascular attenuation   Periphery Attached, No heme Pigmented lattice  inferiorly from 0500 to 0715 with large atrophic holes--positive SRF -- good laser changes surrounding           Refraction     Wearing Rx       Sphere Cylinder Axis   Right -2.75 +0.75 029   Left -2.75 +0.75 146    Type: SVL            IMAGING AND PROCEDURES  Imaging and Procedures for 02/18/2021  OCT, Retina - OU - Both Eyes       Right Eye Quality was good. Central Foveal Thickness: 250. Progression has been stable. Findings include normal foveal contour, no IRF, no SRF.   Left Eye Quality was good. Central Foveal Thickness: 283.  Progression has no prior data. Findings include abnormal foveal contour, no SRF, macular pucker, epiretinal membrane, lamellar hole, intraretinal fluid (Mild ERM with lamellar hole and central cystic changes-stable).   Notes *Images captured and stored on drive  Diagnosis / Impression:  OD: NFP; no IRF/SRF  OS: Mild ERM with lamellar hole and central cystic changes--stable  Clinical management:  See below  Abbreviations: NFP - Normal foveal profile. CME - cystoid macular edema. PED - pigment epithelial detachment. IRF - intraretinal fluid. SRF - subretinal fluid. EZ - ellipsoid zone. ERM - epiretinal membrane. ORA - outer retinal atrophy. ORT - outer retinal tubulation. SRHM - subretinal hyper-reflective material. IRHM - intraretinal hyper-reflective material              ASSESSMENT/PLAN:    ICD-10-CM   1. Lattice degeneration of left retina  H35.412     2. Retinal holes, left  H33.322     3. Retinal detachment, left  H33.22     4. Retinal edema  H35.81 OCT, Retina - OU - Both Eyes    5. Epiretinal membrane (ERM) of left eye  H35.372     6. Lamellar macular hole of left eye  H35.342     7. Essential hypertension  I10     8. Hypertensive retinopathy of both eyes  H35.033     9. Combined forms of age-related cataract of both eyes  H25.813      1-4. Lattice degeneration w/ atrophic holes, OS - Pigmented lattice inferiorly from 0500 to 0715 with large atrophic holes, positive SRF - s/p Retinopexy OS 08.25.22 --good laser changes surrounding - completed PF x7 days - f/u in 2 mos, sooner prn -- POV    5,6.   Epiretinal membrane w/ lamellar macular hole, left eye  - The natural history, anatomy, potential for loss of vision, and treatment options including vitrectomy techniques and the complications of endophthalmitis, retinal detachment, vitreous hemorrhage, cataract progression and permanent vision loss discussed with the patient. - mild ERM w/ lamellar hoe - BCVA  20/25 - asymptomatic, no metamorphopsia - no indication for surgery at this time - monitor for now  7,8. Hypertensive retinopathy OU - discussed importance of tight BP control - monitor   9.    Mixed Cataract, OU - The symptoms of cataract, surgical options, and treatments and risks were discussed with patient. - discussed diagnosis and progression - not yet visually significant - monitor for now  Ophthalmic Meds Ordered this visit:  No orders of the defined types were placed in this encounter.     Return in about 2 months (around 04/20/2021) for 2 mo POV w/DFE&OCT.  There are no Patient Instructions on file for this visit.   Explained the diagnoses, plan, and follow up with  the patient and they expressed understanding.  Patient expressed understanding of the importance of proper follow up care.   This document serves as a record of services personally performed by Gardiner Sleeper, MD, PhD. It was created on their behalf by Leonie Douglas, an ophthalmic technician. The creation of this record is the provider's dictation and/or activities during the visit.    Electronically signed by: Leonie Douglas COA, 02/19/21  5:47 PM  This document serves as a record of services personally performed by Gardiner Sleeper, MD, PhD. It was created on their behalf by Estill Bakes, COT an ophthalmic technician. The creation of this record is the provider's dictation and/or activities during the visit.    Electronically signed by: Estill Bakes, COT 9.2.22 @ 5:47 PM   Gardiner Sleeper, M.D., Ph.D. Diseases & Surgery of the Retina and Bone Gap 02/18/2021  I have reviewed the above documentation for accuracy and completeness, and I agree with the above. Gardiner Sleeper, M.D., Ph.D. 02/19/21 5:47 PM   Abbreviations: M myopia (nearsighted); A astigmatism; H hyperopia (farsighted); P presbyopia; Mrx spectacle prescription;  CTL contact lenses; OD right eye; OS left  eye; OU both eyes  XT exotropia; ET esotropia; PEK punctate epithelial keratitis; PEE punctate epithelial erosions; DES dry eye syndrome; MGD meibomian gland dysfunction; ATs artificial tears; PFAT's preservative free artificial tears; [place] nuclear sclerotic cataract; PSC posterior subcapsular cataract; ERM epi-retinal membrane; PVD posterior vitreous detachment; RD retinal detachment; DM diabetes mellitus; DR diabetic retinopathy; NPDR non-proliferative diabetic retinopathy; PDR proliferative diabetic retinopathy; CSME clinically significant macular edema; DME diabetic macular edema; dbh dot blot hemorrhages; CWS cotton wool spot; POAG primary open angle glaucoma; C/D cup-to-disc ratio; HVF humphrey visual field; GVF goldmann visual field; OCT optical coherence tomography; IOP intraocular pressure; BRVO Branch retinal vein occlusion; CRVO central retinal vein occlusion; CRAO central retinal artery occlusion; BRAO branch retinal artery occlusion; RT retinal tear; SB scleral buckle; PPV pars plana vitrectomy; VH Vitreous hemorrhage; PRP panretinal laser photocoagulation; IVK intravitreal kenalog; VMT vitreomacular traction; MH Macular hole;  NVD neovascularization of the disc; NVE neovascularization elsewhere; AREDS age related eye disease study; ARMD age related macular degeneration; POAG primary open angle glaucoma; EBMD epithelial/anterior basement membrane dystrophy; ACIOL anterior chamber intraocular lens; IOL intraocular lens; PCIOL posterior chamber intraocular lens; Phaco/IOL phacoemulsification with intraocular lens placement; [place] photorefractive keratectomy; LASIK laser assisted in situ keratomileusis; HTN hypertension; DM diabetes mellitus; COPD chronic obstructive pulmonary disease

## 2021-02-16 ENCOUNTER — Encounter: Payer: Self-pay | Admitting: Family Medicine

## 2021-02-16 DIAGNOSIS — G893 Neoplasm related pain (acute) (chronic): Secondary | ICD-10-CM

## 2021-02-16 DIAGNOSIS — C641 Malignant neoplasm of right kidney, except renal pelvis: Secondary | ICD-10-CM

## 2021-02-16 MED ORDER — HYDROCODONE-ACETAMINOPHEN 7.5-325 MG PO TABS: 2.0000 | ORAL_TABLET | ORAL | 0 refills | Status: DC

## 2021-02-18 ENCOUNTER — Other Ambulatory Visit: Payer: Self-pay

## 2021-02-18 ENCOUNTER — Ambulatory Visit (INDEPENDENT_AMBULATORY_CARE_PROVIDER_SITE_OTHER): Payer: Medicare HMO | Admitting: Ophthalmology

## 2021-02-18 DIAGNOSIS — H35033 Hypertensive retinopathy, bilateral: Secondary | ICD-10-CM | POA: Diagnosis not present

## 2021-02-18 DIAGNOSIS — H25813 Combined forms of age-related cataract, bilateral: Secondary | ICD-10-CM

## 2021-02-18 DIAGNOSIS — H3322 Serous retinal detachment, left eye: Secondary | ICD-10-CM

## 2021-02-18 DIAGNOSIS — I10 Essential (primary) hypertension: Secondary | ICD-10-CM | POA: Diagnosis not present

## 2021-02-18 DIAGNOSIS — H35342 Macular cyst, hole, or pseudohole, left eye: Secondary | ICD-10-CM

## 2021-02-18 DIAGNOSIS — H35372 Puckering of macula, left eye: Secondary | ICD-10-CM

## 2021-02-18 DIAGNOSIS — H33322 Round hole, left eye: Secondary | ICD-10-CM

## 2021-02-18 DIAGNOSIS — H35412 Lattice degeneration of retina, left eye: Secondary | ICD-10-CM | POA: Diagnosis not present

## 2021-02-18 DIAGNOSIS — H3581 Retinal edema: Secondary | ICD-10-CM | POA: Diagnosis not present

## 2021-02-19 ENCOUNTER — Encounter (INDEPENDENT_AMBULATORY_CARE_PROVIDER_SITE_OTHER): Payer: Self-pay | Admitting: Ophthalmology

## 2021-03-08 DIAGNOSIS — R2681 Unsteadiness on feet: Secondary | ICD-10-CM | POA: Diagnosis not present

## 2021-03-08 DIAGNOSIS — C649 Malignant neoplasm of unspecified kidney, except renal pelvis: Secondary | ICD-10-CM | POA: Diagnosis not present

## 2021-03-08 DIAGNOSIS — C7951 Secondary malignant neoplasm of bone: Secondary | ICD-10-CM | POA: Diagnosis not present

## 2021-03-08 DIAGNOSIS — R61 Generalized hyperhidrosis: Secondary | ICD-10-CM | POA: Diagnosis not present

## 2021-03-08 DIAGNOSIS — L309 Dermatitis, unspecified: Secondary | ICD-10-CM | POA: Diagnosis not present

## 2021-03-08 DIAGNOSIS — R2689 Other abnormalities of gait and mobility: Secondary | ICD-10-CM | POA: Diagnosis not present

## 2021-03-08 DIAGNOSIS — Z79899 Other long term (current) drug therapy: Secondary | ICD-10-CM | POA: Diagnosis not present

## 2021-03-08 DIAGNOSIS — D630 Anemia in neoplastic disease: Secondary | ICD-10-CM | POA: Diagnosis not present

## 2021-03-08 DIAGNOSIS — Z1509 Genetic susceptibility to other malignant neoplasm: Secondary | ICD-10-CM | POA: Diagnosis not present

## 2021-03-08 DIAGNOSIS — Z5112 Encounter for antineoplastic immunotherapy: Secondary | ICD-10-CM | POA: Diagnosis not present

## 2021-03-09 ENCOUNTER — Ambulatory Visit (INDEPENDENT_AMBULATORY_CARE_PROVIDER_SITE_OTHER): Payer: Medicare HMO

## 2021-03-09 VITALS — Ht 73.0 in | Wt 166.0 lb

## 2021-03-09 DIAGNOSIS — Z0000 Encounter for general adult medical examination without abnormal findings: Secondary | ICD-10-CM | POA: Diagnosis not present

## 2021-03-09 NOTE — Patient Instructions (Signed)
Douglas [name] , Thank you for taking time to complete your Medicare Wellness Visit. I appreciate your ongoing commitment to your health goals. Please review the following plan we discussed and let me know if I can assist you in the future.   Screening recommendations/referrals: Colonoscopy: Discuss with oncologist Recommended yearly ophthalmology/optometry visit for glaucoma screening and checkup Recommended yearly dental visit for hygiene and checkup  Vaccinations: Influenza vaccine: Due-May obtain vaccine at our office or your local pharmacy Pneumococcal vaccine: Due-May obtain vaccine at our office or your local pharmacy Tdap vaccine: Up to date-06/03/2025 Shingles vaccine: Discuss with oncologist   Covid-19: Discuss with cardiologist  Advanced directives: Copy in chart  Conditions/risks identified: See problem list  Next appointment: Follow up in one year for your annual wellness visit.   Preventive Care 70 Years and Older, Male Preventive care refers to lifestyle choices and visits with your health care provider that can promote health and wellness. What does preventive care include? A yearly physical exam. This is also called an annual well check. Dental exams once or twice a year. Routine eye exams. Ask your health care provider how often you should have your eyes checked. Personal lifestyle choices, including: Daily care of your teeth and gums. Regular physical activity. Eating a healthy diet. Avoiding tobacco and drug use. Limiting alcohol use. Practicing safe sex. Taking low doses of aspirin every day. Taking vitamin and mineral supplements as recommended by your health care provider. What happens during an annual well check? The services and screenings done by your health care provider during your annual well check will depend on your age, overall health, lifestyle risk factors, and family history of disease. Counseling  Your health care provider may ask you questions  about your: Alcohol use. Tobacco use. Drug use. Emotional well-being. Home and relationship well-being. Sexual activity. Eating habits. History of falls. Memory and ability to understand (cognition). Work and work Statistician. Screening  You may have the following tests or measurements: Height, weight, and BMI. Blood pressure. Lipid and cholesterol levels. These may be checked every 5 years, or more frequently if you are over 36 years old. Skin check. Lung cancer screening. You may have this screening every year starting at age 40 if you have a 30-pack-year history of smoking and currently smoke or have quit within the past 15 years. Fecal occult blood test (FOBT) of the stool. You may have this test every year starting at age 61. Flexible sigmoidoscopy or colonoscopy. You may have a sigmoidoscopy every 5 years or a colonoscopy every 10 years starting at age 41. Prostate cancer screening. Recommendations will vary depending on your family history and other risks. Hepatitis C blood test. Hepatitis B blood test. Sexually transmitted disease (STD) testing. Diabetes screening. This is done by checking your blood sugar (glucose) after you have not eaten for a while (fasting). You may have this done every 1-3 years. Abdominal aortic aneurysm (AAA) screening. You may need this if you are a current or former smoker. Osteoporosis. You may be screened starting at age 84 if you are at high risk. Talk with your health care provider about your test results, treatment options, and if necessary, the need for more tests. Vaccines  Your health care provider may recommend certain vaccines, such as: Influenza vaccine. This is recommended every year. Tetanus, diphtheria, and acellular pertussis (Tdap, Td) vaccine. You may need a Td booster every 10 years. Zoster vaccine. You may need this after age 69. Pneumococcal 13-valent conjugate (PCV13) vaccine. One  dose is recommended after age 22. Pneumococcal  polysaccharide (PPSV23) vaccine. One dose is recommended after age 42. Talk to your health care provider about which screenings and vaccines you need and how often you need them. This information is not intended to replace advice given to you by your health care provider. Make sure you discuss any questions you have with your health care provider. Document Released: 07/02/2015 Document Revised: 02/23/2016 Document Reviewed: 04/06/2015 Elsevier Interactive Patient Education  2017 [place] Prevention in the Home Falls can cause injuries. They can happen to people of all ages. There are many things you can do to make your home safe and to help prevent falls. What can I do on the outside of my home? Regularly fix the edges of walkways and driveways and fix any cracks. Remove anything that might make you trip as you walk through a door, such as a raised step or threshold. Trim any bushes or trees on the path to your home. Use bright outdoor lighting. Clear any walking paths of anything that might make someone trip, such as rocks or tools. Regularly check to see if handrails are loose or broken. Make sure that both sides of any steps have handrails. Any raised decks and porches should have guardrails on the edges. Have any leaves, snow, or ice cleared regularly. Use sand or salt on walking paths during winter. Clean up any spills in your garage right away. This includes oil or grease spills. What can I do in the bathroom? Use night lights. Install grab bars by the toilet and in the tub and shower. Do not use towel bars as grab bars. Use non-skid mats or decals in the tub or shower. If you need to sit down in the shower, use a plastic, non-slip stool. Keep the floor dry. Clean up any water that spills on the floor as soon as it happens. Remove soap buildup in the tub or shower regularly. Attach bath mats securely with double-sided non-slip rug tape. Do not have throw rugs and other  things on the floor that can make you trip. What can I do in the bedroom? Use night lights. Make sure that you have a light by your bed that is easy to reach. Do not use any sheets or blankets that are too big for your bed. They should not hang down onto the floor. Have a firm chair that has side arms. You can use this for support while you get dressed. Do not have throw rugs and other things on the floor that can make you trip. What can I do in the kitchen? Clean up any spills right away. Avoid walking on wet floors. Keep items that you use a lot in easy-to-reach places. If you need to reach something above you, use a strong step stool that has a grab bar. Keep electrical cords out of the way. Do not use floor polish or wax that makes floors slippery. If you must use wax, use non-skid floor wax. Do not have throw rugs and other things on the floor that can make you trip. What can I do with my stairs? Do not leave any items on the stairs. Make sure that there are handrails on both sides of the stairs and use them. Fix handrails that are broken or loose. Make sure that handrails are as long as the stairways. Check any carpeting to make sure that it is firmly attached to the stairs. Fix any carpet that is loose or worn.  Avoid having throw rugs at the top or bottom of the stairs. If you do have throw rugs, attach them to the floor with carpet tape. Make sure that you have a light switch at the top of the stairs and the bottom of the stairs. If you do not have them, ask someone to add them for you. What else can I do to help prevent falls? Wear shoes that: Do not have high heels. Have rubber bottoms. Are comfortable and fit you well. Are closed at the toe. Do not wear sandals. If you use a stepladder: Make sure that it is fully opened. Do not climb a closed stepladder. Make sure that both sides of the stepladder are locked into place. Ask someone to hold it for you, if possible. Clearly  mark and make sure that you can see: Any grab bars or handrails. First and last steps. Where the edge of each step is. Use tools that help you move around (mobility aids) if they are needed. These include: Canes. Walkers. Scooters. Crutches. Turn on the lights when you go into a dark area. Replace any light bulbs as soon as they burn out. Set up your furniture so you have a clear path. Avoid moving your furniture around. If any of your floors are uneven, fix them. If there are any pets around you, be aware of where they are. Review your medicines with your doctor. Some medicines can make you feel dizzy. This can increase your chance of falling. Ask your doctor what other things that you can do to help prevent falls. This information is not intended to replace advice given to you by your health care provider. Make sure you discuss any questions you have with your health care provider. Document Released: 04/01/2009 Document Revised: 11/11/2015 Document Reviewed: 07/10/2014 Elsevier Interactive Patient Education  2017 [employer].

## 2021-03-09 NOTE — Progress Notes (Signed)
Subjective:   Douglas [name] is a 65 y.o. male who presents for an Initial Medicare Annual Wellness Visit.  I connected with Cylan today by telephone and verified that I am speaking with the correct person using two identifiers. Location patient: home Location provider: work Persons participating in the virtual visit: patient, Marine scientist.    I discussed the limitations, risks, security and privacy concerns of performing an evaluation and management service by telephone and the availability of in person appointments. I also discussed with the patient that there may be a patient responsible charge related to this service. The patient expressed understanding and verbally consented to this telephonic visit.    Interactive audio and video telecommunications were attempted between this provider and patient, however failed, due to patient having technical difficulties OR patient did not have access to video capability.  We continued and completed visit with audio only.  Some vital signs may be absent or patient reported.   Time Spent with patient on telephone encounter: 40 minutes   Review of Systems     Cardiac Risk Factors include: advanced age (>54men, >73 women);dyslipidemia;hypertension;male gender     Objective:    Today's Vitals   03/09/21 1340 03/09/21 1344  Weight: 166 lb (75.3 kg)   Height: 6\' 1"  (1.854 m)   PainSc:  4    Body mass index is 21.9 kg/m.  Advanced Directives 03/09/2021 05/24/2020 07/29/2018 03/11/2018 12/05/2013 10/16/2013 05/22/2012  Does Patient Have a Medical Advance Directive? Yes No Yes Yes Patient has advance directive, copy not in chart Patient has advance directive, copy in chart Patient has advance directive, copy in chart  Type of Advance Directive [place];Living will - [place];Living will [place];Living will Healthcare Power of [place];Living will [place]  Does patient want to make changes to medical advance directive? - - - No - Patient declined No change requested No change requested -  Copy of [place] in Chart? Yes - validated most recent copy scanned in chart (See row information) - No - copy requested No - copy requested Copy requested from family - -  Would patient like information on creating a medical advance directive? - No - Patient declined - - - - -  Pre-existing out of facility DNR order (yellow form or pink MOST form) - - - - - No No    Current Medications (verified) Outpatient Encounter Medications as of 03/09/2021  Medication Sig   aspirin EC 81 MG tablet Take 1 tablet (81 mg total) by mouth daily. Swallow whole.   B Complex Vitamins (B COMPLEX 100 PO) Take 100 mg by mouth daily.   calcium carbonate (TUMS - DOSED IN MG ELEMENTAL CALCIUM) 500 MG chewable tablet Chew 500 mg by mouth 3 (three) times daily as needed for indigestion or heartburn.   carvedilol (COREG) 3.125 MG tablet TAKE 1 TABLET (3.125 MG TOTAL) BY MOUTH 2 (TWO) TIMES DAILY WITH A MEAL.   cetirizine (ZYRTEC) 10 MG tablet Take 5 mg by mouth daily.    cholecalciferol (VITAMIN D3) 25 MCG (1000 UNIT) tablet Take 2,000 Units by mouth daily.   clopidogrel (PLAVIX) 75 MG tablet Take 1 tablet (75 mg total) by mouth daily.   denosumab (XGEVA) 120 MG/1.7ML SOLN injection Inject 120 mg into the skin every 30 (thirty) days.    DULoxetine (CYMBALTA) 20 MG capsule TAKE ONE CAPSULE BY MOUTH DAILY   HYDROcodone-acetaminophen (NORCO) 7.5-325  MG tablet Take 2 tablets by mouth See admin instructions. Take 2 every 4 hours through out the day until taking ativan at bedtime, do not mix   loperamide (IMODIUM A-D) 2 MG tablet Take 2 mg by mouth 4 (four) times daily as needed for diarrhea or loose stools.   LORazepam (ATIVAN) 1 MG tablet Take 0.5-1 tablets (0.5-1 mg total) by mouth at bedtime as needed for sleep. TAKE ONE HALF OR ONE TABLET BY MOUTH AT BEDTIME IF  NEEDED FOR SLEEP, DO NOT TAKE WITH PAIN MEDS   Multiple Vitamins-Minerals (CENTRUM SILVER 50+MEN PO) Take 1 tablet by mouth daily.   nitroGLYCERIN (NITROSTAT) 0.4 MG SL tablet Place 1 tablet (0.4 mg total) under the tongue every 5 (five) minutes x 3 doses as needed for chest pain. Please dispense #25 x 4 bottle   Nivolumab (OPDIVO IV) Inject into the vein every 14 (fourteen) days.   ondansetron (ZOFRAN-ODT) 4 MG disintegrating tablet TAKE 1 TABLET BY MOUTH SUBLINGUALLY EVERY 8 HOURS AS NEEDED FOR NAUSEA OR VOMITING   pantoprazole (PROTONIX) 20 MG tablet Take 1 tablet (20 mg total) by mouth daily.   rosuvastatin (CRESTOR) 10 MG tablet TAKE 1 TABLET BY MOUTH EVERY DAY   sacubitril-valsartan (ENTRESTO) 24-26 MG Take 1 tablet by mouth 2 (two) times daily.   traZODone (DESYREL) 50 MG tablet TAKE 0.5-1 TABLETS BY MOUTH AT BEDTIME AS NEEDED FOR SLEEP.   UNABLE TO FIND Take 1 tablet by mouth daily. Med Name: Calcium 600/D-3 300 2 daily   vitamin C (ASCORBIC ACID) 500 MG tablet Take 500 mg by mouth daily.   No facility-administered encounter medications on file as of 03/09/2021.    Allergies (verified) Avelox [moxifloxacin hcl in nacl], Medical adhesive remover, and Tape   History: Past Medical History:  Diagnosis Date   Allergic rhinitis    Arthritis    "all over my body" (10/16/2013)   Chronic back pain    "neck to lower back" (10/16/2013)   Compression fracture    S/P L-SPINE; pt does not recall this hx on 10/16/2013   COPD (chronic obstructive pulmonary disease) (HCC)    FVC .66   Coronary artery disease    Depression    "situational since 09/22/2013 OR"   Encounter for long-term (current) use of other medications    GERD (gastroesophageal reflux disease)    Hemoptysis    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)    History of blood transfusion 09/2013   "think so; not sure; related to big OR"   History of surgical fusion joint    DDD C-SPINE   Hyperlipidemia     Hypertension    Leiomyoma OF SKIN   INCREASED RISK RENAL CELL CA  NEEDS CT OF KIDNEYS EVERY 2 YEARS NEXT DUE  02/06/13   Melanoma in situ ([place])    Melanoma of back ([place])    Neuromuscular disorder ([place])    Osteoarthritis    Renal calculi    Renal cell carcinoma ([place])    type 2; papillary   Tobacco use disorder    Unspecified vitamin D deficiency    Past Surgical History:  Procedure Laterality Date   ANTERIOR CERVICAL DECOMP/DISCECTOMY FUSION  1992   ANTERIOR CRUCIATE LIGAMENT REPAIR Bilateral [phone]   APPENDECTOMY  09/22/2013   CARDIAC CATHETERIZATION     CHOLECYSTECTOMY  09/22/2013   "had tumors in it"   CORONARY STENT INTERVENTION N/A 05/24/2020   Procedure: CORONARY STENT INTERVENTION;  Surgeon: Martinique, Peter M,  MD;  Location: [facility];  Service: Cardiovascular;  Laterality: N/A;   HAND LIGAMENT RECONSTRUCTION Right    HARVEST BONE GRAFT Right 1992   "hip; for neck fusion"   INGUINAL HERNIA REPAIR Right 2004   LEFT HEART CATH AND CORONARY ANGIOGRAPHY N/A 05/24/2020   Procedure: LEFT HEART CATH AND CORONARY ANGIOGRAPHY;  Surgeon: Martinique, Peter M, MD;  Location: [facility];  Service: Cardiovascular;  Laterality: N/A;   LIGAMENT REPAIR Right ~ 2001   "little finger"   LYMPH NODE DISSECTION Right 09/22/2013   "in the area of the kidney; they were clean"   MELANOMA EXCISION  ~ 2007   "off my back"   PARTIAL NEPHRECTOMY Right 09/22/2013   removal of fascia overlying the right psoas Right 09/22/2013   RENAL ARTERY STENT Right 09/22/2013   RIGHT COLECTOMY Right 09/22/2013   ROBOTIC ASSITED PARTIAL NEPHRECTOMY  05/22/2012   Procedure: ROBOTIC ASSITED PARTIAL NEPHRECTOMY;  Surgeon: Alexis Frock, MD;  Location: [facility];  Service: Urology;  Laterality: Right;  Right Robotic Cyst Decortication and Partial Nephrectomy    SPINE SURGERY     Family History  Adopted: Yes  Problem Relation Age of Onset   Blindness Mother    Glaucoma Mother     Hypertension Father    Social History   Socioeconomic History   Marital status: Married    Spouse name: Not on file   Number of children: Not on file   Years of education: Not on file   Highest education level: 12th grade  Occupational History   Occupation: Scientist, clinical (histocompatibility and immunogenetics): [employer]    Comment: Retired  Tobacco Use   Smoking status: Former    Packs/day: 0.50    Years: 40.00    Pack years: 20.00    Types: Cigarettes   Smokeless tobacco: Never  Vaping Use   Vaping Use: Never used  Substance and Sexual Activity   Alcohol use: Not Currently   Drug use: Yes    Types: Marijuana   Sexual activity: Not Currently  Other Topics Concern   Not on file  Social History Narrative   Walks daily for exercise.   Social Determinants of Health   Financial Resource Strain: Low Risk    Difficulty of Paying Living Expenses: Not hard at all  Food Insecurity: No Food Insecurity   Worried About Charity fundraiser in the Last Year: Never true   [place] in the Last Year: Never true  Transportation Needs: No Transportation Needs   Lack of Transportation (Medical): No   Lack of Transportation (Non-Medical): No  Physical Activity: Sufficiently Active   Days of Exercise per Week: 5 days   Minutes of Exercise per Session: 30 min  Stress: No Stress Concern Present   Feeling of Stress : Only a little  Social Connections: Moderately Isolated   Frequency of Communication with Friends and Family: Once a week   Frequency of Social Gatherings with Friends and Family: Once a week   Attends Religious Services: Never   Marine scientist or Organizations: Yes   Attends Music therapist: 1 to 4 times per year   Marital Status: Married    Tobacco Counseling Counseling given: Not Answered   Clinical Intake:  Pre-visit preparation completed: Yes  Pain : 0-10 Pain Score: 4  Pain Type: Chronic pain Pain Location: Shoulder (and neck & hip pain &  sternum) Pain Orientation: Left Pain Onset: More than  a month ago Pain Frequency: Constant     BMI - recorded: 21.9 Nutritional Status: BMI of 19-24  Normal Nutritional Risks: None Diabetes: No  How often do you need to have someone help you when you read instructions, pamphlets, or other written materials from your doctor or pharmacy?: 1 - Never  Diabetic?No  Interpreter Needed?: No  Information entered by :: Caroleen Hamman LPN   Activities of Daily Living In your present state of health, do you have any difficulty performing the following activities: 03/09/2021 05/24/2020  Hearing? N -  Vision? N -  Difficulty concentrating or making decisions? N -  Walking or climbing stairs? N -  Dressing or bathing? N -  Doing errands, shopping? N N  Preparing Food and eating ? N -  Using the Toilet? N -  In the past six months, have you accidently leaked urine? N -  Do you have problems with loss of bowel control? N -  Managing your Medications? N -  Managing your Finances? N -  Housekeeping or managing your Housekeeping? N -  Some recent data might be hidden    Patient Care Team: Copland, Gay Filler, MD as PCP - General (Family Medicine) Josue Hector, MD as PCP - Cardiology (Cardiology) Danella Sensing, MD (Dermatology) Lafayette Dragon, MD (Inactive) (Gastroenterology) Ward Givens, MD (Inactive) (General Surgery) Tanda Rockers, MD (Pulmonary Disease) Marchia Bond, MD as Consulting Physician (Orthopedic Surgery) Alexis Frock, MD as Consulting Physician (Urology)  Indicate any recent Medical Services you may have received from other than [facility] providers in the past year (date may be approximate).     Assessment:   This is a routine wellness examination for Douglas [name].  Hearing/Vision screen Hearing Screening - Comments:: No issues Vision Screening - Comments:: Last eye exam-01/2021-Dr. Groat  Dietary issues and exercise activities discussed: Current Exercise  Habits: Home exercise routine, Type of exercise: walking, Time (Minutes): 30, Frequency (Times/Week): 5, Weekly Exercise (Minutes/Week): 150, Intensity: Mild   Goals Addressed             This Visit's Progress    Patient Stated       "Stay alive"       Depression Screen PHQ 2/9 Scores 03/09/2021 10/27/2020 09/15/2020 07/20/2020 06/03/2016 03/06/2016 01/17/2016  PHQ - 2 Score 1 2 1  0 0 0 0  Exception Documentation - - - - - - -    Fall Risk Fall Risk  03/09/2021 10/27/2020 07/20/2020 06/03/2016 03/06/2016  Falls in the past year? 0 0 0 No No  Number falls in past yr: 0 - 0 - -  Injury with Fall? 0 - 0 - -  Risk for fall due to : - - Impaired balance/gait - -  Follow up Falls prevention discussed - Falls evaluation completed - -    FALL RISK PREVENTION PERTAINING TO THE HOME:  Any stairs in or around the home? Yes  If so, are there any without handrails? No  Home free of loose throw rugs in walkways, pet beds, electrical cords, etc? Yes  Adequate lighting in your home to reduce risk of falls? Yes   ASSISTIVE DEVICES UTILIZED TO PREVENT FALLS:  Life alert? No  Use of a cane, walker or w/c? No  Grab bars in the bathroom? Yes  Shower chair or bench in shower? No  Elevated toilet seat or a handicapped toilet? No   TIMED UP AND GO:  Was the test performed? No . Phone visit   Cognitive Function:Normal  cognitive status assessed by this Nurse Health Advisor. No abnormalities found.          Immunizations Immunization History  Administered Date(s) Administered   Fluad Quad(high Dose 65+) 04/14/2020   Influenza,inj,Quad PF,6+ Mos 02/25/2015, 02/19/2016, 03/05/2017, 02/25/2018, 02/10/2019   Influenza-Unspecified 04/06/2014   PFIZER(Purple Top)SARS-COV-2 Vaccination 09/04/2019, 09/25/2019   Pneumococcal Conjugate-13 09/22/2014   Tdap 04/20/2007, 06/04/2015   Zoster, Live 10/17/2013    TDAP status: Up to date  Flu Vaccine status: Due, Education has been provided regarding  the importance of this vaccine. Advised may receive this vaccine at local pharmacy or Health Dept. Aware to provide a copy of the vaccination record if obtained from local pharmacy or Health Dept. Verbalized acceptance and understanding.  Pneumococcal vaccine status: Due, Education has been provided regarding the importance of this vaccine. Advised may receive this vaccine at local pharmacy or Health Dept. Aware to provide a copy of the vaccination record if obtained from local pharmacy or Health Dept. Verbalized acceptance and understanding.  Covid-19 vaccine status: Information provided on how to obtain vaccines.  Patient  to discuss with cardiologist to see if ok to receive booster.  Qualifies for Shingles Vaccine? Yes   Zostavax completed Yes   Shingrix Completed?: No.    Education has been provided regarding the importance of this vaccine. Patient has been advised to call insurance company to determine out of pocket expense if they have not yet received this vaccine. Advised may also receive vaccine at local pharmacy or Health Dept. Verbalized acceptance and understanding.  Screening Tests Health Maintenance  Topic Date Due   Zoster Vaccines- Shingrix (1 of 2) Never done   COLONOSCOPY (Pts 45-50yrs Insurance coverage will need to be confirmed)  10/08/2016   COVID-19 Vaccine (3 - Pfizer risk series) 10/23/2019   INFLUENZA VACCINE  01/17/2021   TETANUS/TDAP  06/03/2025   Hepatitis C Screening  Completed   HIV Screening  Completed   HPV VACCINES  Aged Out    Health Maintenance  Health Maintenance Due  Topic Date Due   Zoster Vaccines- Shingrix (1 of 2) Never done   COLONOSCOPY (Pts 45-80yrs Insurance coverage will need to be confirmed)  10/08/2016   COVID-19 Vaccine (3 - Pfizer risk series) 10/23/2019   INFLUENZA VACCINE  01/17/2021    Colorectal cancer screening: patient to discuss with oncologist.  Lung Cancer Screening: (Low Dose CT Chest recommended if Age 63-80 years, 30  pack-year currently smoking OR have quit w/in 15years.) does not qualify.     Additional Screening:  Hepatitis C Screening: Completed 11/20/2016  Vision Screening: Recommended annual ophthalmology exams for early detection of glaucoma and other disorders of the eye. Is the patient up to date with their annual eye exam?  Yes  Who is the provider or what is the name of the office in which the patient attends annual eye exams? Dr. Katy Fitch   Dental Screening: Recommended annual dental exams for proper oral hygiene  Community Resource Referral / Chronic Care Management: CRR required this visit?  No   CCM required this visit?  No      Plan:     I have personally reviewed and noted the following in the patient's chart:   Medical and social history Use of alcohol, tobacco or illicit drugs  Current medications and supplements including opioid prescriptions. Cancer patient Functional ability and status Nutritional status Physical activity Advanced directives List of other physicians Hospitalizations, surgeries, and ER visits in previous 12 months Vitals Screenings to include  cognitive, depression, and falls Referrals and appointments  In addition, I have reviewed and discussed with patient certain preventive protocols, quality metrics, and best practice recommendations. A written personalized care plan for preventive services as well as general preventive health recommendations were provided to patient.   Due to this being a telephonic visit, the after visit summary with patients personalized plan was offered to patient via mail or my-chart. Patient would like to access on my-chart.  Marta Antu, LPN   [id number]  Nurse Health Advisor  Nurse Notes: None

## 2021-03-13 NOTE — Progress Notes (Signed)
[place] at [facility] [address] [place], [place] [address] [phone]  Date:  03/16/2021   Name:  Douglas [name]   DOB:  Nov 27, 1955   MRN:  [id number]  PCP:  Darreld Mclean, MD    Chief Complaint: update UDS and vaccinations (Pneumonia and flu shots./Concerns/ questions: inquires about Zoster. Concerns about covid booster. Question about colonoscopy since he is on a new cancer drug. Coughed up some blood this morning, had teeth cleaned yesterday and thinks it might be related. Questions about the spots on his arms. Updates on vision since last OV. Labs done yesterday at [facility]. Refill on pain meds next Monday. Questions related to a friends end of life situation that is similar to his. /)   History of Present Illness:  Sebastin Perlmutter is a 65 y.o. very pleasant male patient who presents with the following:  Patient seen today for follow-up.  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  Most recent fill of 240 pills on 9/4.  He also uses lorazepam for anxiety [facility] for many years, this stopped due to changes in his disease status and he is now being treated locally  He is currently being seen by oncology at [facility] Per note on 9/20: HARSHA YUSKO is a 65 y.o. with Hereditary leiomyomatosis and renal cell cancer who was treated for 5 years since diagnosis on a clinical trial at the [facility] with a combination of bevacizumab and erlotinib. Due to disease progression he has been started on immunotherapy with PD1 inhibitor nivolumab since April 2022 by his primary medical oncologist. He has completed 4 cycles of nivolumab dose to 240 mg once every 2 weeks. He is also receiving rank ligand inhibitor therapy denosumab 120 mg once every 4 weeks Interim Note:   ROLLA KEDZIERSKI returns today for follow up of leiomyomatosis with renal cell cancer getting Keytruda every 2 weeks. He is feeling well today. Patient reports that he is still having balance problems. He states that everytime he eats, he breaks out in sweats. Patient denies any symptoms such as fever, chills, night sweats, chest pain, shortness of breath, constipation or diarrhea. ECOG performance status 1.  Assessment and Plan:  Leiomyomatosis with renal cell cancer - On systemic immunotherapy with Opdivo. He seems to be tolerating it quite well without any signs or symptoms to suggest immune mediated toxicity. Patient is clinically doing well today. He is due for Opdivo and Xgeva today.    He is a patient at [facility] and did some labs for them yesterday- he has an appt on Monday  He coughed up a small amount of blood this am- bright red However he had his teeth cleaned yesterday and had a CT of his chest in July of 2022.  I advised him to let me know if this continues to occur  He is now allowed to have a screening colon and would like to pursue this- will get in touch with his GI doctor to schedule follow-up.  He was not able to undergo a colonoscopy while using Avastin   His left shoulder continues to be very painful for him- he will see his ortho about this soon for follow-up He recently was working overhead to take down a photo and this triggered an  exacerbation of the shoulder pain   A friend of his from the [facility] is going downhill and likely will pass away soon.  This is hard for Josie to think about.  He notes that he feels depressed oftentimes a couple of days a week.  However, neither hand he knows he has beat the odds and lived much longer than would have been expected.  He tries to keep a positive outlook He is on 1/2 trazodone and 20 mg cymbalta  He is sleeping pretty well   We discussed potentially changing or increasing his dose of antidepressant medication.  For the time  being he declines, he will let me know if this changes  He is walking about 1 mile a day for exercise-this is not always easy Patient Active Problem List   Diagnosis Date Noted   NSTEMI (non-ST elevated myocardial infarction) ([place]) 05/24/2020   Acute ST elevation myocardial infarction (STEMI) ([place])    Bone metastases ([place]) 07/26/2016   CAD (coronary artery disease) 03/25/2014   Protein-calorie malnutrition, severe ([place]) 10/17/2013   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) 08/20/2012   Renal calculi    GERD (gastroesophageal reflux disease)    Allergic rhinitis    Osteoarthritis    Encounter for long-term (current) use of other medications    Hyperlipidemia    Tobacco use disorder    COPD (chronic obstructive pulmonary disease) ([place])    Unspecified vitamin D deficiency    Hypertension     Past Medical History:  Diagnosis Date   Allergic rhinitis    Arthritis    "all over my body" (10/16/2013)   Chronic back pain    "neck to lower back" (10/16/2013)   Compression fracture    S/P L-SPINE; pt does not recall this hx on 10/16/2013   COPD (chronic obstructive pulmonary disease) ([place])    FVC .66   Coronary artery disease    Depression    "situational since 09/22/2013 OR"   Encounter for long-term (current) use of other medications    GERD (gastroesophageal reflux disease)    Hemoptysis    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)    History of blood transfusion 09/2013   "think so; not sure; related to big OR"   History of surgical fusion joint    DDD C-SPINE   Hyperlipidemia    Hypertension    Leiomyoma OF SKIN   INCREASED RISK RENAL CELL CA  NEEDS CT OF KIDNEYS EVERY 2 YEARS NEXT DUE  02/06/13   Melanoma in situ ([place])    Melanoma of back ([place])    Neuromuscular disorder ([place])    Osteoarthritis    Renal calculi    Renal cell carcinoma ([place])    type 2; papillary   Tobacco use disorder    Unspecified vitamin D deficiency     Past Surgical  History:  Procedure Laterality Date   ANTERIOR CERVICAL DECOMP/DISCECTOMY FUSION  1992   ANTERIOR CRUCIATE LIGAMENT REPAIR Bilateral [id number]   APPENDECTOMY  09/22/2013   CARDIAC CATHETERIZATION     CHOLECYSTECTOMY  09/22/2013   "had tumors in it"   CORONARY STENT INTERVENTION N/A 05/24/2020   Procedure: CORONARY STENT INTERVENTION;  Surgeon: Martinique, Peter M, MD;  Location: [facility];  Service: Cardiovascular;  Laterality: N/A;   HAND LIGAMENT RECONSTRUCTION Right    HARVEST BONE GRAFT Right 1992   "hip; for neck fusion"   INGUINAL HERNIA REPAIR Right 2004   LEFT HEART CATH AND  CORONARY ANGIOGRAPHY N/A 05/24/2020   Procedure: LEFT HEART CATH AND CORONARY ANGIOGRAPHY;  Surgeon: Martinique, Peter M, MD;  Location: [facility];  Service: Cardiovascular;  Laterality: N/A;   LIGAMENT REPAIR Right ~ 2001   "little finger"   LYMPH NODE DISSECTION Right 09/22/2013   "in the area of the kidney; they were clean"   MELANOMA EXCISION  ~ 2007   "off my back"   PARTIAL NEPHRECTOMY Right 09/22/2013   removal of fascia overlying the right psoas Right 09/22/2013   RENAL ARTERY STENT Right 09/22/2013   RIGHT COLECTOMY Right 09/22/2013   ROBOTIC ASSITED PARTIAL NEPHRECTOMY  05/22/2012   Procedure: ROBOTIC ASSITED PARTIAL NEPHRECTOMY;  Surgeon: Alexis Frock, MD;  Location: [facility];  Service: Urology;  Laterality: Right;  Right Robotic Cyst Decortication and Partial Nephrectomy    SPINE SURGERY      Social History   Tobacco Use   Smoking status: Former    Packs/day: 0.50    Years: 40.00    Pack years: 20.00    Types: Cigarettes   Smokeless tobacco: Never  Vaping Use   Vaping Use: Never used  Substance Use Topics   Alcohol use: Not Currently   Drug use: Yes    Types: Marijuana    Family History  Adopted: Yes  Problem Relation Age of Onset   Blindness Mother    Glaucoma Mother    Hypertension Father     Allergies  Allergen Reactions   Avelox  [Moxifloxacin Hcl In Nacl] Hives, Shortness Of Breath and Swelling   Medical Adhesive Remover Dermatitis and Rash   Tape Dermatitis and Rash    Medication list has been reviewed and updated.  Current Outpatient Medications on File Prior to Visit  Medication Sig Dispense Refill   aspirin EC 81 MG tablet Take 1 tablet (81 mg total) by mouth daily. Swallow whole.     B Complex Vitamins (B COMPLEX 100 PO) Take 100 mg by mouth daily.     calcium carbonate (TUMS - DOSED IN MG ELEMENTAL CALCIUM) 500 MG chewable tablet Chew 500 mg by mouth 3 (three) times daily as needed for indigestion or heartburn.     carvedilol (COREG) 3.125 MG tablet TAKE 1 TABLET (3.125 MG TOTAL) BY MOUTH 2 (TWO) TIMES DAILY WITH A MEAL. 180 tablet 2   cetirizine (ZYRTEC) 10 MG tablet Take 5 mg by mouth daily.      cholecalciferol (VITAMIN D3) 25 MCG (1000 UNIT) tablet Take 2,000 Units by mouth daily.     clopidogrel (PLAVIX) 75 MG tablet Take 1 tablet (75 mg total) by mouth daily. 30 tablet 11   denosumab (XGEVA) 120 MG/1.7ML SOLN injection Inject 120 mg into the skin every 30 (thirty) days.      DULoxetine (CYMBALTA) 20 MG capsule TAKE ONE CAPSULE BY MOUTH DAILY 90 capsule 3   loperamide (IMODIUM A-D) 2 MG tablet Take 2 mg by mouth 4 (four) times daily as needed for diarrhea or loose stools.     LORazepam (ATIVAN) 1 MG tablet Take 0.5-1 tablets (0.5-1 mg total) by mouth at bedtime as needed for sleep. TAKE ONE HALF OR ONE TABLET BY MOUTH AT BEDTIME IF NEEDED FOR SLEEP, DO NOT TAKE WITH PAIN MEDS 30 tablet 3   Multiple Vitamins-Minerals (CENTRUM SILVER 50+MEN PO) Take 1 tablet by mouth daily.     nitroGLYCERIN (NITROSTAT) 0.4 MG SL tablet Place 1 tablet (0.4 mg total) under the tongue every 5 (five) minutes x 3 doses as needed  for chest pain. Please dispense #25 x 4 bottle 100 tablet 1   Nivolumab (OPDIVO IV) Inject into the vein every 14 (fourteen) days.     ondansetron (ZOFRAN-ODT) 4 MG disintegrating tablet TAKE 1 TABLET BY  MOUTH SUBLINGUALLY EVERY 8 HOURS AS NEEDED FOR NAUSEA OR VOMITING 40 tablet 2   pantoprazole (PROTONIX) 20 MG tablet Take 1 tablet (20 mg total) by mouth daily. 90 tablet 3   pseudoephedrine (SUDAFED) 30 MG tablet Take 30 mg by mouth every 4 (four) hours as needed for congestion.     rosuvastatin (CRESTOR) 10 MG tablet TAKE 1 TABLET BY MOUTH EVERY DAY 90 tablet 2   sacubitril-valsartan (ENTRESTO) 24-26 MG Take 1 tablet by mouth 2 (two) times daily. 60 tablet 6   traZODone (DESYREL) 50 MG tablet TAKE 0.5-1 TABLETS BY MOUTH AT BEDTIME AS NEEDED FOR SLEEP. 90 tablet 2   UNABLE TO FIND Take 1 tablet by mouth daily. Med Name: Calcium 600/D-3 300 2 daily     vitamin C (ASCORBIC ACID) 500 MG tablet Take 500 mg by mouth daily.     No current facility-administered medications on file prior to visit.    Review of Systems:  As per HPI- otherwise negative.   Physical Examination: Vitals:   03/16/21 1421  BP: 118/80  Pulse: 61  Resp: 18  SpO2: 98%   Vitals:   03/16/21 1421  Weight: 169 lb 6.4 oz (76.8 kg)  Height: 6\' 1"  (1.854 m)   Body mass index is 22.35 kg/m. Ideal Body Weight: Weight in (lb) to have BMI = 25: 189.1  GEN: no acute distress.  Slim build, appears his normal self.  Chronic illness HEENT: Atraumatic, Normocephalic.  Ears and Nose: No external deformity. CV: RRR, No M/G/R. No JVD. No thrill. No extra heart sounds. PULM: CTA B, no wheezes, crackles, rhonchi. No retractions. No resp. distress. No accessory muscle use. ABD: S, NT, ND, +BS. No rebound. No HSM. EXTR: No c/c/e PSYCH: Normally interactive. Conversant.  He has multiple small bruises on both arms  Assessment and Plan: Renal cell cancer, right ([place]) - Plan: HYDROcodone-acetaminophen ([place]) 7.5-325 MG tablet  Chronic pain due to neoplasm - Plan: DRUG MONITORING, PANEL 8 WITH CONFIRMATION, URINE, HYDROcodone-acetaminophen ([place]) 7.5-325 MG tablet  Essential hypertension  Immunization due - Plan:  Pneumococcal polysaccharide vaccine 23-valent greater than or equal to 2yo subcutaneous/IM  Need for influenza vaccination - Plan: Flu Vaccine QUAD High Dose(Fluad)  Screening for colon cancer  Adjustment disorder with depressed mood  Following up today.  Refill chronic pain medication for cancer with bone mets, annual urine drug screen today Blood pressure under good control Given flu shot and pneumonia vaccine today He would like to pursue a colonoscopy, I sent a message to his gastroenterologist There are some question about him receiving future COVID boosters due to temporal relationship to his last booster and heart attack which occurred last December. However, his cardiologist feels that doing the newest Bivalent distress likely okay since he is now on new cancer drugs and also on aspirin/Plavix.  I agree that the COVID booster seems reasonable  Discussed signs or symptoms of depression, certainly these feelings are quite normal.  For the time being he will continue his current medications, let me know if any changes or other concerns  This visit occurred during the SARS-CoV-2 public health emergency.  Safety protocols were in place, including screening questions prior to the visit, additional usage of staff PPE, and extensive cleaning of exam room  while observing appropriate contact time as indicated for disinfecting solutions.   Signed Lamar Blinks, MD

## 2021-03-13 NOTE — Patient Instructions (Addendum)
It was great to see you again today!  We can plan to visit in about 6 months Ok to do the covid booster and shingles vaccine (shingrix) at your discretion I sent a message to your GI doc- [facility]- about getting you set up for a colonoscopy Please let me know if your mood symptoms/ depression are getting worse at all!

## 2021-03-15 DIAGNOSIS — N1831 Chronic kidney disease, stage 3a: Secondary | ICD-10-CM | POA: Diagnosis not present

## 2021-03-16 ENCOUNTER — Other Ambulatory Visit: Payer: Self-pay

## 2021-03-16 ENCOUNTER — Ambulatory Visit (INDEPENDENT_AMBULATORY_CARE_PROVIDER_SITE_OTHER): Payer: Medicare HMO | Admitting: Family Medicine

## 2021-03-16 ENCOUNTER — Encounter: Payer: Self-pay | Admitting: Family Medicine

## 2021-03-16 VITALS — BP 118/80 | HR 61 | Resp 18 | Ht 73.0 in | Wt 169.4 lb

## 2021-03-16 DIAGNOSIS — C641 Malignant neoplasm of right kidney, except renal pelvis: Secondary | ICD-10-CM | POA: Diagnosis not present

## 2021-03-16 DIAGNOSIS — I10 Essential (primary) hypertension: Secondary | ICD-10-CM | POA: Diagnosis not present

## 2021-03-16 DIAGNOSIS — Z23 Encounter for immunization: Secondary | ICD-10-CM | POA: Diagnosis not present

## 2021-03-16 DIAGNOSIS — G893 Neoplasm related pain (acute) (chronic): Secondary | ICD-10-CM | POA: Diagnosis not present

## 2021-03-16 DIAGNOSIS — Z79899 Other long term (current) drug therapy: Secondary | ICD-10-CM

## 2021-03-16 DIAGNOSIS — Z1211 Encounter for screening for malignant neoplasm of colon: Secondary | ICD-10-CM | POA: Diagnosis not present

## 2021-03-16 DIAGNOSIS — R69 Illness, unspecified: Secondary | ICD-10-CM | POA: Diagnosis not present

## 2021-03-16 DIAGNOSIS — F4321 Adjustment disorder with depressed mood: Secondary | ICD-10-CM

## 2021-03-16 MED ORDER — HYDROCODONE-ACETAMINOPHEN 7.5-325 MG PO TABS: 2.0000 | ORAL_TABLET | ORAL | 0 refills | Status: DC

## 2021-03-17 ENCOUNTER — Telehealth: Payer: Self-pay

## 2021-03-17 NOTE — Telephone Encounter (Signed)
Left detailed message on vm for patient to return call to schedule an appt with Dr. Havery Moros.

## 2021-03-17 NOTE — Telephone Encounter (Signed)
-----   Message from Yetta Flock, MD sent at 03/16/2021  6:16 PM EDT ----- Herbert Seta can you help book this patient a follow up office visit with me? Thanks  ----- Message ----- From: Darreld Mclean, MD Sent: 03/16/2021   2:40 PM EDT To: Yetta Flock, MD  Hi Remo Lipps- this patient was not able to get his screening colon in 2018 as he was under cancer treatment at the [facility].  His treatment situation has changed and he would now like to pursue a screening colon; would you be so kind as to ask your staff to reach out to him Thank you!  [place]

## 2021-03-18 NOTE — Telephone Encounter (Signed)
Left detailed vm for patient to return call to schedule an appt with Dr. Havery Moros.

## 2021-03-21 ENCOUNTER — Encounter: Payer: Self-pay | Admitting: Gastroenterology

## 2021-03-21 DIAGNOSIS — I129 Hypertensive chronic kidney disease with stage 1 through stage 4 chronic kidney disease, or unspecified chronic kidney disease: Secondary | ICD-10-CM | POA: Diagnosis not present

## 2021-03-21 DIAGNOSIS — N1831 Chronic kidney disease, stage 3a: Secondary | ICD-10-CM | POA: Diagnosis not present

## 2021-03-21 DIAGNOSIS — Z1509 Genetic susceptibility to other malignant neoplasm: Secondary | ICD-10-CM | POA: Diagnosis not present

## 2021-03-21 DIAGNOSIS — R69 Illness, unspecified: Secondary | ICD-10-CM | POA: Diagnosis not present

## 2021-03-21 DIAGNOSIS — R809 Proteinuria, unspecified: Secondary | ICD-10-CM | POA: Diagnosis not present

## 2021-03-21 DIAGNOSIS — I2510 Atherosclerotic heart disease of native coronary artery without angina pectoris: Secondary | ICD-10-CM | POA: Diagnosis not present

## 2021-03-21 LAB — DRUG MONITORING, PANEL 8 WITH CONFIRMATION, URINE
6 Acetylmorphine: NEGATIVE ng/mL (ref <10)
Alcohol Metabolites: NEGATIVE ng/mL (ref <500)
Alphahydroxyalprazolam: NEGATIVE ng/mL (ref <25)
Alphahydroxymidazolam: NEGATIVE ng/mL (ref <50)
Alphahydroxytriazolam: NEGATIVE ng/mL (ref <50)
Aminoclonazepam: NEGATIVE ng/mL (ref <25)
Amphetamines: NEGATIVE ng/mL (ref <500)
Benzodiazepines: POSITIVE ng/mL — AB (ref <100)
Buprenorphine, Urine: NEGATIVE ng/mL (ref <5)
Cocaine Metabolite: NEGATIVE ng/mL (ref <150)
Codeine: NEGATIVE ng/mL (ref <50)
Creatinine: 124.9 mg/dL (ref >=20.0)
Hydrocodone: 3462 ng/mL — ABNORMAL HIGH (ref <50)
Hydromorphone: 2142 ng/mL — ABNORMAL HIGH (ref <50)
Hydroxyethylflurazepam: NEGATIVE ng/mL (ref <50)
Lorazepam: 399 ng/mL — ABNORMAL HIGH (ref <50)
MDMA: NEGATIVE ng/mL (ref <500)
Marijuana Metabolite: 90 ng/mL — ABNORMAL HIGH (ref <5)
Marijuana Metabolite: POSITIVE ng/mL — AB (ref <20)
Morphine: NEGATIVE ng/mL (ref <50)
Nordiazepam: NEGATIVE ng/mL (ref <50)
Norhydrocodone: 3931 ng/mL — ABNORMAL HIGH (ref <50)
Opiates: POSITIVE ng/mL — AB (ref <100)
Oxazepam: NEGATIVE ng/mL (ref <50)
Oxidant: NEGATIVE ug/mL (ref <200)
Oxycodone: NEGATIVE ng/mL (ref <100)
Temazepam: NEGATIVE ng/mL (ref <50)
pH: 5.8 (ref 4.5–9.0)

## 2021-03-21 LAB — DM TEMPLATE

## 2021-03-21 NOTE — Telephone Encounter (Signed)
Pt has been scheduled to see Dr. Havery Moros on 05/02/21 @ 2:10pm

## 2021-03-21 NOTE — Telephone Encounter (Signed)
Noted, thanks!

## 2021-03-22 DIAGNOSIS — Z5112 Encounter for antineoplastic immunotherapy: Secondary | ICD-10-CM | POA: Diagnosis not present

## 2021-03-22 DIAGNOSIS — C649 Malignant neoplasm of unspecified kidney, except renal pelvis: Secondary | ICD-10-CM | POA: Diagnosis not present

## 2021-03-22 DIAGNOSIS — D630 Anemia in neoplastic disease: Secondary | ICD-10-CM | POA: Diagnosis not present

## 2021-03-30 ENCOUNTER — Encounter: Payer: Self-pay | Admitting: Family Medicine

## 2021-04-05 DIAGNOSIS — R2681 Unsteadiness on feet: Secondary | ICD-10-CM | POA: Diagnosis not present

## 2021-04-05 DIAGNOSIS — C649 Malignant neoplasm of unspecified kidney, except renal pelvis: Secondary | ICD-10-CM | POA: Diagnosis not present

## 2021-04-05 DIAGNOSIS — D481 Neoplasm of uncertain behavior of connective and other soft tissue: Secondary | ICD-10-CM | POA: Diagnosis not present

## 2021-04-05 DIAGNOSIS — I10 Essential (primary) hypertension: Secondary | ICD-10-CM | POA: Diagnosis not present

## 2021-04-05 DIAGNOSIS — Z5112 Encounter for antineoplastic immunotherapy: Secondary | ICD-10-CM | POA: Diagnosis not present

## 2021-04-05 DIAGNOSIS — D630 Anemia in neoplastic disease: Secondary | ICD-10-CM | POA: Diagnosis not present

## 2021-04-05 DIAGNOSIS — Z1509 Genetic susceptibility to other malignant neoplasm: Secondary | ICD-10-CM | POA: Diagnosis not present

## 2021-04-05 DIAGNOSIS — D649 Anemia, unspecified: Secondary | ICD-10-CM | POA: Diagnosis not present

## 2021-04-05 DIAGNOSIS — C7951 Secondary malignant neoplasm of bone: Secondary | ICD-10-CM | POA: Diagnosis not present

## 2021-04-10 DIAGNOSIS — R0902 Hypoxemia: Secondary | ICD-10-CM | POA: Diagnosis not present

## 2021-04-10 DIAGNOSIS — R0789 Other chest pain: Secondary | ICD-10-CM | POA: Diagnosis not present

## 2021-04-10 DIAGNOSIS — R1013 Epigastric pain: Secondary | ICD-10-CM | POA: Diagnosis not present

## 2021-04-10 DIAGNOSIS — R079 Chest pain, unspecified: Secondary | ICD-10-CM | POA: Diagnosis not present

## 2021-04-11 ENCOUNTER — Telehealth: Payer: Self-pay | Admitting: *Deleted

## 2021-04-11 NOTE — Telephone Encounter (Signed)
   [facility] Pre-operative Risk Assessment    Patient Name: Douglas [name]  DOB: [birth year] MRN: [id number]  HEARTCARE STAFF:  - IMPORTANT!!!!!! Under Visit Info/Reason for Call, type in Other and utilize the format Clearance MM/DD/YY or Clearance TBD. Do not use dashes or single digits. - Please review there is not already an duplicate clearance open for this procedure. - If request is for dental extraction, please clarify the # of teeth to be extracted. - If the patient is currently at the dentist's office, call Pre-Op Callback Staff (MA/nurse) to input urgent request.  - If the patient is not currently in the dentist office, please route to the Pre-Op pool.  Request for surgical clearance:  What type of surgery is being performed? Single tooth extraction  When is this surgery scheduled? TBD  What type of clearance is required (medical clearance vs. Pharmacy clearance to hold med vs. Both)? Both  Are there any medications that need to be held prior to surgery and how long? Asa 81 and plavix  Practice name and name of physician performing surgery? [facility], Dr Douglas [name]  What is the office phone number? [phone]   7.   What is the office fax number? [phone]  8.   Anesthesia type (None, local, MAC, general) ? Local   Douglas [name] 04/11/2021, 4:38 PM  _________________________________________________________________   (provider comments below)

## 2021-04-12 ENCOUNTER — Ambulatory Visit (INDEPENDENT_AMBULATORY_CARE_PROVIDER_SITE_OTHER): Payer: Medicare HMO | Admitting: Cardiology

## 2021-04-12 ENCOUNTER — Encounter: Payer: Self-pay | Admitting: Cardiology

## 2021-04-12 ENCOUNTER — Other Ambulatory Visit: Payer: Self-pay

## 2021-04-12 VITALS — BP 124/86 | HR 73 | Ht 73.0 in | Wt 163.0 lb

## 2021-04-12 DIAGNOSIS — I2583 Coronary atherosclerosis due to lipid rich plaque: Secondary | ICD-10-CM | POA: Diagnosis not present

## 2021-04-12 DIAGNOSIS — I2510 Atherosclerotic heart disease of native coronary artery without angina pectoris: Secondary | ICD-10-CM

## 2021-04-12 DIAGNOSIS — I10 Essential (primary) hypertension: Secondary | ICD-10-CM | POA: Diagnosis not present

## 2021-04-12 DIAGNOSIS — E7800 Pure hypercholesterolemia, unspecified: Secondary | ICD-10-CM | POA: Diagnosis not present

## 2021-04-12 DIAGNOSIS — R079 Chest pain, unspecified: Secondary | ICD-10-CM

## 2021-04-12 LAB — TROPONIN T: Troponin T (Highly Sensitive): 21 ng/L (ref 0–22)

## 2021-04-12 MED ORDER — METOPROLOL TARTRATE 100 MG PO TABS: 100.0000 mg | ORAL_TABLET | Freq: Once | ORAL | 0 refills | Status: DC

## 2021-04-12 NOTE — Progress Notes (Signed)
CARDIOLOGY OFFICE NOTE  Date:  04/12/2021    Douglas [name] Date of Birth: Nov 02, 1955 Medical Record #[id number]  PCP:  Darreld Mclean, MD  Cardiologist:  Johnsie Cancel  Chief Complaint  Patient presents with   Chest Pain   Coronary Artery Disease   Cardiomyopathy   Hypertension   Hyperlipidemia     History of Present Illness:  65 y.o. 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 Followed at [place] for years  In clinical trials. Has been on Tarceva and Avastin .  Also has a hx of ASCAD with STEMI with DES to proximal LAD 05/24/20 And residual 50% Ramus. EF was 35-40%. Due to CAD and DAT he was taken off his Avastin and Tarceva both contraindicated with ischemic heart disease. Started on Kennedy and coreg no aldactone due to soft BP, lack of volume overload and renal dx  MRI 08/27/20 E 31% LAD scar no mural apical thrombus  Eliquis stopped  Discussed possible need for AICD in future. Needs to f/u [facility] for alternative Rx cancer in light of his CAD and moderately reduced EF  09/09/20 Dr Franki Monte [phone] called directly to discuss She is contemplating using Nivolumab as single Agent immunoRx This drug has a small risk of myocarditis but is certainly safer than avastin and tarceva  09/23/20 spoke with Dr Samul Dada at [facility] who is apparently involved now and starting Nivolumab every 2 weeks I told him we would plan on f/u Cardiac MRI June   Has now started Nivolumab/Opdivo has more congestion and MSK pains on it Noted 21-42% incidence of this side effect with drug Also more GERD. Discussed being ok to take any 2 nd generation antihistamine with sudafed  if needed   TTE reviewed 10/04/20 EF 35-40%  MRI 12/01/20 reviewed EF 39% anterior/septal/apical scar   He called into the office today complaining of chest pain.  He also need preop clearance for simple dental extractions and needed clearance to hold antiplatelet therapy for this.  He tells me that on Sunday out  of nowhere he got a severe pain in the epigastric area.  He says that he had similar pain a year ago related to his chemo meds.  He tells me that this pain was the same pain that he had when he had his PCI.  The pain lasted most of the day on Sunday with some radiation into his abdomen that was the same as last year and he called EMS.  It lasted about 20 minutes and then resolved.  After the EMT left he had recurrent pain an hour later and went over the [facility] but it was busy and he did not go in .  The pain resolved in 15-20min.  He did not take any NTG  because his BP was soft.  He does not recall if he had SOB but did have some nausea.  Since then he has not had any further CP.    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)    History of blood transfusion 09/2013   "think so; not sure; related to big OR"   History of surgical fusion joint    DDD C-SPINE   Hyperlipidemia    Hypertension    Leiomyoma OF SKIN   INCREASED RISK RENAL CELL CA  NEEDS CT OF KIDNEYS EVERY 2 YEARS NEXT DUE  02/06/13   Melanoma in situ ([place])    Melanoma of back ([place])    Neuromuscular disorder ([place])    Osteoarthritis    Renal calculi    Renal cell carcinoma ([place])    type 2; papillary   Tobacco use disorder    Unspecified vitamin D deficiency     Past Surgical History:  Procedure Laterality Date   ANTERIOR CERVICAL DECOMP/DISCECTOMY FUSION  1992   ANTERIOR CRUCIATE LIGAMENT REPAIR Bilateral [phone]   APPENDECTOMY  09/22/2013   CARDIAC CATHETERIZATION     CHOLECYSTECTOMY  09/22/2013   "had tumors in it"   CORONARY STENT INTERVENTION N/A 05/24/2020   Procedure: CORONARY STENT INTERVENTION;  Surgeon: Martinique, Peter M, MD;  Location: [facility];  Service: Cardiovascular;  Laterality: N/A;   HAND LIGAMENT RECONSTRUCTION Right    HARVEST BONE GRAFT Right 1992   "hip; for neck fusion"   INGUINAL HERNIA REPAIR Right 2004   LEFT HEART CATH AND CORONARY ANGIOGRAPHY N/A 05/24/2020   Procedure: LEFT HEART CATH AND CORONARY ANGIOGRAPHY;  Surgeon: Martinique, Peter M, MD;  Location: [facility];  Service: Cardiovascular;  Laterality: N/A;   LIGAMENT REPAIR Right ~ 2001   "little finger"   LYMPH NODE DISSECTION Right 09/22/2013   "in the area of the kidney; they were clean"   MELANOMA EXCISION  ~ 2007   "off my back"   PARTIAL NEPHRECTOMY Right 09/22/2013   removal of fascia overlying the right psoas Right 09/22/2013   RENAL ARTERY STENT Right 09/22/2013   RIGHT COLECTOMY Right 09/22/2013   ROBOTIC ASSITED PARTIAL NEPHRECTOMY  05/22/2012   Procedure: ROBOTIC ASSITED PARTIAL NEPHRECTOMY;  Surgeon: Alexis Frock, MD;  Location: [facility];  Service: Urology;  Laterality: Right;  Right Robotic Cyst Decortication and Partial Nephrectomy    SPINE SURGERY       Medications: Current Meds  Medication Sig   aspirin EC 81 MG tablet Take 1 tablet (81 mg total) by mouth daily. Swallow whole.   B Complex Vitamins (B COMPLEX 100 PO) Take 100 mg by mouth daily.   calcium carbonate (TUMS - DOSED IN MG ELEMENTAL CALCIUM) 500 MG chewable tablet Chew 500 mg by mouth 3 (three) times daily as needed for indigestion or heartburn.   carvedilol (COREG) 3.125 MG tablet TAKE 1 TABLET (3.125 MG TOTAL) BY MOUTH 2 (TWO) TIMES DAILY WITH A MEAL.   cetirizine (ZYRTEC) 10 MG tablet Take 5 mg by mouth daily.    cholecalciferol (VITAMIN D3) 25 MCG (1000 UNIT) tablet Take 2,000 Units by mouth daily.    clopidogrel (PLAVIX) 75 MG tablet Take 1 tablet (75 mg total) by mouth daily.   denosumab (XGEVA) 120 MG/1.7ML SOLN injection Inject 120 mg into the skin every 30 (thirty) days.    DULoxetine (CYMBALTA) 20 MG capsule TAKE ONE CAPSULE BY MOUTH DAILY   HYDROcodone-acetaminophen (NORCO) 7.5-325 MG tablet Take 2 tablets by mouth See admin instructions. Take 2 every 4 hours through out the day until taking ativan at bedtime,  do not mix   loperamide (IMODIUM A-D) 2 MG tablet Take 2 mg by mouth 4 (four) times daily as needed for diarrhea or loose stools.   LORazepam (ATIVAN) 1 MG tablet Take 0.5-1 tablets (0.5-1 mg total) by mouth at bedtime as needed for sleep. TAKE ONE HALF OR ONE TABLET BY MOUTH AT BEDTIME IF NEEDED FOR SLEEP, DO NOT TAKE WITH PAIN MEDS   Multiple Vitamins-Minerals (CENTRUM SILVER 50+MEN PO) Take 1 tablet by mouth daily.   nitroGLYCERIN (NITROSTAT) 0.4 MG SL tablet Place 1 tablet (0.4 mg total) under the tongue every 5 (five) minutes x 3 doses as needed for chest pain. Please dispense #25 x 4 bottle   Nivolumab (OPDIVO IV) Inject into the vein every 14 (fourteen) days.   ondansetron (ZOFRAN-ODT) 4 MG disintegrating tablet TAKE 1 TABLET BY MOUTH SUBLINGUALLY EVERY 8 HOURS AS NEEDED FOR NAUSEA OR VOMITING   pantoprazole (PROTONIX) 20 MG tablet Take 1 tablet (20 mg total) by mouth daily.   pseudoephedrine (SUDAFED) 30 MG tablet Take 30 mg by mouth every 4 (four) hours as needed for congestion.   rosuvastatin (CRESTOR) 10 MG tablet TAKE 1 TABLET BY MOUTH EVERY DAY   sacubitril-valsartan (ENTRESTO) 24-26 MG Take 1 tablet by mouth 2 (two) times daily.   traZODone (DESYREL) 50 MG tablet TAKE 0.5-1 TABLETS BY MOUTH AT BEDTIME AS NEEDED FOR SLEEP.   UNABLE TO FIND Take 1 tablet by mouth daily. Med Name: Calcium 600/D-3 300 2 daily   vitamin C (ASCORBIC ACID) 500 MG tablet Take 500 mg by mouth daily.     Allergies: Allergies  Allergen Reactions   Avelox [Moxifloxacin Hcl In Nacl] Hives,  Shortness Of Breath and Swelling   Medical Adhesive Remover Dermatitis and Rash   Tape Dermatitis and Rash    Social History: The patient  reports that he has quit smoking. His smoking use included cigarettes. He has a 20.00 pack-year smoking history. He has never used smokeless tobacco. He reports that he does not currently use alcohol. He reports current drug use. Drug: Marijuana.   Family History: The patient's family history includes Blindness in his mother; Glaucoma in his mother; Hypertension in his father. He was adopted.   Review of Systems: Please see the history of present illness.   All other systems are reviewed and negative.   Physical Exam: VS:  BP 124/86   Pulse 73   Ht 6' 1"  (1.854 m)   Wt 163 lb (73.9 kg)   SpO2 98%   BMI 21.51 kg/m  .  BMI Body mass index is 21.51 kg/m.  Wt Readings from Last 3 Encounters:  04/12/21 163 lb (73.9 kg)  03/16/21 169 lb 6.4 oz (76.8 kg)  03/09/21 166 lb (75.3 kg)   GEN: Well nourished, well developed in no acute distress HEENT: Normal NECK: No JVD; No carotid bruits LYMPHATICS: No lymphadenopathy CARDIAC:RRR, no murmurs, rubs, gallops RESPIRATORY:  Clear to auscultation without rales, wheezing or rhonchi  ABDOMEN: Soft, non-tender, non-distended MUSCULOSKELETAL:  No edema; No deformity  SKIN: Warm and dry NEUROLOGIC:  Alert and oriented x 3 PSYCHIATRIC:  Normal affect   LABORATORY DATA:  EKG:   performed today and showed NSR with no ST changes.   Lab Results  Component Value Date   WBC 6.9 08/26/2020   HGB 12.4 (L) 08/26/2020   HCT 36.8 (L) 08/26/2020   PLT 205 08/26/2020   GLUCOSE 98 11/22/2020   CHOL 146 06/02/2020   TRIG 244 (H) 06/02/2020  HDL 50 06/02/2020   LDLDIRECT 63.0 05/01/2019   LDLCALC 57 06/02/2020   ALT 33 08/23/2020   AST 31 08/23/2020   NA 139 11/22/2020   K 4.8 11/22/2020   CL 104 11/22/2020   CREATININE 1.30 (H) 11/22/2020   BUN 28 (H) 11/22/2020   CO2 19 (L) 11/22/2020   TSH 1.376  05/24/2020   PSA 1.86 04/14/2020   INR 1.0 05/24/2020   HGBA1C 5.2 05/29/2016     BNP (last 3 results) No results for input(s): BNP in the last 8760 hours.  ProBNP (last 3 results) No results for input(s): PROBNP in the last 8760 hours.   Other Studies Reviewed Today:  Cardiac cath 05/24/20  Prox LAD lesion is 90% stenosed. Ramus lesion is 50% stenosed. Mid RCA lesion is 35% stenosed. Post intervention, there is a 0% residual stenosis. A drug-eluting stent was successfully placed using a STENT RESOLUTE ONYX 3.0X22. LV end diastolic pressure is normal.   1. Single vessel occlusive CAD involving the proximal LAD 2. Normal LVEDP 3. Successful PCI of the proximal LAD with DES x 1   Plan: DAPT for one year. Risk factor modification. Will assess LV function with Echo.    Echo Impression 05/25/20   1. Left ventricular ejection fraction, by estimation, is 35 to 40%. The  left ventricle has moderately decreased function. The left ventricle has  no regional wall motion abnormalities. Left ventricular diastolic  parameters are consistent with Grade I  diastolic dysfunction (impaired relaxation). There is mild apical  dyskinesis. There is severe hypokinesis of the entire anterior septum, but  the basal and mid segments of the anterior wall have preserved  contractility.   2. Right ventricular systolic function is normal. The right ventricular  size is normal.   3. The mitral valve is normal in structure. No evidence of mitral valve  regurgitation. No evidence of mitral stenosis.   4. The aortic valve is normal in structure. Aortic valve regurgitation is  not visualized. No aortic stenosis is present.   5. The inferior vena cava is normal in size with greater than 50%  respiratory variability, suggesting right atrial pressure of 3 mmHg.    Assessment/Plan: 1. STEMI -  05/24/20 with one vessel LAD CAD - s/p PCI  - on DAPT  - needs to have simple dental extractions done>>will  defer to dentistry as they usually can pull teeth on DAPT  2. ICM  - chronic systolic HF  -Continue prescription drug management with Entresto 24-26 mg twice daily, carvedilol 3.125 mg twice daily  -EF 39% by MRI 12/01/20 with no evidence Of myocarditis outside anterior infarcted region  -he does not appear volume overloaded on exam today  3. HLD  - he is on statin with LDL goal less than 70 -We will have him come in for FLP and ALT -Continue prescription drug management with Crestor 10 mg daily with as needed refills  4. Elevated LFTs  - resolved normal 06/02/20 likely related to acute MI  5. Hereditary leiomyomatosis and renal cell cancer with mets  - Seeing Dr Samul [facility] at [facility] started on Nivolumab every 2 weeks - Main cardiac side effects myocarditis   6.  Chest pain -He had an episode of chest pain on Sunday twice and called EMS who did an EKG that was fine but they left and he had another episode of CP that lasted 20 min and then resolved on its own -he has not had any further CP and walking 1  mile today with no problems.   He has known CAD with recent stent in the past year so need to consider restenosis of stent or progression of other disease but also is on a medication that is known to cause myocarditis  -I will get a stat hs troponin today as well as CRP and ESR -If troponin is normal then we will set up for a coronary CTA make sure that LAD proximal stent is patent (he has a 3 mm stent which should be able to be assessed on coronary CTA)  Current medicines are reviewed with the patient today.  The patient does not have concerns regarding medicines other than what has been noted above.  The following changes have been made:  See above.    Orders Placed This Encounter  Procedures   EKG 12-Lead    F/U closely with oncology to address nausea meds, vaccination, f/u lab work   Disposition:   FU 6 months    Signed: Fransico Him, MD  04/12/2021 3:37 PM  [facility] [address] [place], [place]  [address] Phone: [phone] Fax: [phone]

## 2021-04-12 NOTE — Addendum Note (Signed)
Addended by: Antonieta Iba on: 04/12/2021 03:43 PM   Modules accepted: Orders

## 2021-04-12 NOTE — Patient Instructions (Addendum)
Medication Instructions:  Your physician recommends that you continue on your current medications as directed. Please refer to the Current Medication list given to you today.  *If you need a refill on your cardiac medications before your next appointment, please call your pharmacy*  Lab Work: TODAY: troponin, CRP, ESR and BMET If you have labs (blood work) drawn today and your tests are completely normal, you will receive your results only by: [place] (if you have MyChart) OR A paper copy in the mail If you have any lab test that is abnormal or we need to change your treatment, we will call you to review the results.   Testing/Procedures: Your provider has recommended that you have a coronary CTA scan. Please see below for further instructions.   Follow-Up: At [facility], you and your health needs are our priority.  As part of our continuing mission to provide you with exceptional heart care, we have created designated Provider Care Teams.  These Care Teams include your primary Cardiologist (physician) and Advanced Practice Providers (APPs -  Physician Assistants and Nurse Practitioners) who all work together to provide you with the care you need, when you need it.    Your next appointment:   6 month(s)  The format for your next appointment:   In Person  Provider:   You may see Jenkins Rouge, MD or one of the following Advanced Practice Providers on your designated Care Team:   Cecilie Kicks, NP    Your cardiac CT will be scheduled at:  [facility] [address] [place], [place] [address] [phone]  If scheduled at [facility], please arrive at the [facility] main entrance (entrance A) of [facility] 30 minutes prior to test start time. You can use the FREE valet parking offered at the main entrance (encouraged to control the heart rate for the test) Proceed to the [facility] Radiology Department (first floor) to check-in and test  prep.  Please follow these instructions carefully (unless otherwise directed):  Hold all erectile dysfunction medications at least 3 days (72 hrs) prior to test.  On the Night Before the Test: Be sure to Drink plenty of water. Do not consume any caffeinated/decaffeinated beverages or chocolate 12 hours prior to your test. Do not take any antihistamines 12 hours prior to your test.  On the Day of the Test: Drink plenty of water until 1 hour prior to the test. Do not eat any food 4 hours prior to the test. You may take your regular medications prior to the test.  Take metoprolol (Lopressor) two hours prior to test.  After the Test: Drink plenty of water. After receiving IV contrast, you may experience a mild flushed feeling. This is normal. On occasion, you may experience a mild rash up to 24 hours after the test. This is not dangerous. If this occurs, you can take Benadryl 25 mg and increase your fluid intake. If you experience trouble breathing, this can be serious. If it is severe call 911 IMMEDIATELY. If it is mild, please call our office. If you take any of these medications: Glipizide/Metformin, Avandament, Glucavance, please do not take 48 hours after completing test unless otherwise instructed.  Please allow 2-4 weeks for scheduling of routine cardiac CTs. Some insurance companies require a pre-authorization which may delay scheduling of this test.   For non-scheduling related questions, please contact the cardiac imaging nurse navigator should you have any questions/concerns: Marchia Bond, Cardiac Imaging Nurse Navigator Kerin Ransom  Jearld Shines, Cardiac Imaging Nurse Navigator [facility] Direct Office Dial: [phone]   For scheduling needs, including cancellations and rescheduling, please call [place], [phone].

## 2021-04-12 NOTE — Telephone Encounter (Addendum)
   Patient Name: Douglas [name]  DOB: [birth year] MRN: [id number]  Primary Cardiologist: Jenkins Rouge, MD  Chart reviewed as part of pre-operative protocol coverage. Typically simple dental extractions are low risk and do not require any pre-op clearance or holding of antiplatelets but patient is seeing DOD Dr. Radford Pax today for evaluation of chest pain. Will route to Dr. Radford Pax and her nurse to be aware we have also received this clearance and please advise on proceeding once patient is seen. Of note, no SBE needs identified at this time. Will keep in preop box for now.  Charlie Pitter, PA-C 04/12/2021, 8:59 AM

## 2021-04-12 NOTE — Telephone Encounter (Signed)
Patient has appointment with DOD today.

## 2021-04-12 NOTE — Addendum Note (Signed)
Addended by: Antonieta Iba on: 04/12/2021 03:44 PM   Modules accepted: Orders

## 2021-04-12 NOTE — Addendum Note (Signed)
Addended by: Antonieta Iba on: 04/12/2021 03:49 PM   Modules accepted: Orders

## 2021-04-13 ENCOUNTER — Telehealth: Payer: Self-pay

## 2021-04-13 ENCOUNTER — Encounter: Payer: Self-pay | Admitting: Family Medicine

## 2021-04-13 DIAGNOSIS — C641 Malignant neoplasm of right kidney, except renal pelvis: Secondary | ICD-10-CM

## 2021-04-13 DIAGNOSIS — I2583 Coronary atherosclerosis due to lipid rich plaque: Secondary | ICD-10-CM

## 2021-04-13 DIAGNOSIS — R079 Chest pain, unspecified: Secondary | ICD-10-CM

## 2021-04-13 DIAGNOSIS — G893 Neoplasm related pain (acute) (chronic): Secondary | ICD-10-CM

## 2021-04-13 LAB — SEDIMENTATION RATE: Sed Rate: 33 mm/hr — ABNORMAL HIGH (ref 0–30)

## 2021-04-13 LAB — BASIC METABOLIC PANEL
BUN/Creatinine Ratio: 17 (ref 10–24)
BUN: 25 mg/dL (ref 8–27)
CO2: 22 mmol/L (ref 20–29)
Calcium: 9.6 mg/dL (ref 8.6–10.2)
Chloride: 104 mmol/L (ref 96–106)
Creatinine, Ser: 1.47 mg/dL — ABNORMAL HIGH (ref 0.76–1.27)
Glucose: 99 mg/dL (ref 70–99)
Potassium: 4.8 mmol/L (ref 3.5–5.2)
Sodium: 140 mmol/L (ref 134–144)
eGFR: 53 mL/min/{1.73_m2} — ABNORMAL LOW (ref >59)

## 2021-04-13 LAB — HIGH SENSITIVITY CRP: CRP, High Sensitivity: 7.43 mg/L — ABNORMAL HIGH (ref 0.00–3.00)

## 2021-04-13 MED ORDER — HYDROCODONE-ACETAMINOPHEN 7.5-325 MG PO TABS: 2.0000 | ORAL_TABLET | ORAL | 0 refills | Status: DC

## 2021-04-13 NOTE — Telephone Encounter (Signed)
-----   Message from Sueanne Margarita, MD sent at 04/13/2021  2:22 PM EDT ----- Sed rate mildly elevated and CRP high.  Trop normal.  Doubt this is myocarditis with normal Trop.  Inflammatory markers are nonspecifically elevated and may be due to  his renal CA and arthritis.  I will get a Lexiscan myoview to rule out ischemia in the LAD territory where his stent was placed.   Shared Decision Making/Informed Consent The risks [chest pain, shortness of breath, cardiac arrhythmias, dizziness, blood pressure fluctuations, myocardial infarction, stroke/transient ischemic attack, nausea, vomiting, allergic reaction, radiation exposure, metallic taste sensation and life-threatening complications (estimated to be 1 in 10,000)], benefits (risk stratification, diagnosing coronary artery disease, treatment guidance) and alternatives of a nuclear stress test were discussed in detail with Douglas [name] and he agrees to proceed.

## 2021-04-13 NOTE — Telephone Encounter (Signed)
   [facility] Pre-operative Risk Assessment    Patient Name: Douglas [name]  DOB: 08/08/1955 MRN: [id number]   Request for surgical clearance:  What type of surgery is being performed? Tooth extraction (1)  When is this surgery scheduled? TBD  What type of clearance is required (medical clearance vs. Pharmacy clearance to hold med vs. Both)? Medical   Are there any medications that need to be held prior to surgery and how long? None  Practice name and name of physician performing surgery?  [facility], DDS  What is the office phone number? [phone]   7.   What is the office fax number? [place]: Douglas [name]  8.   Anesthesia type (None, local, MAC, general) ? Local   Douglas [name] 04/13/2021, 2:18 PM  _________________________________________________________________   (provider comments below)

## 2021-04-13 NOTE — Telephone Encounter (Signed)
Duplicate clearance. Will re-fax from other phone note with Eppie Gibson.

## 2021-04-13 NOTE — Telephone Encounter (Signed)
Orders have been placed.

## 2021-04-13 NOTE — Telephone Encounter (Signed)
   Patient Name: Douglas [name]  DOB: 1955/08/20 MRN: [id number]  Primary Cardiologist: Jenkins Rouge, MD  Chart reviewed as part of pre-operative protocol coverage.   IF SIMPLE EXTRACTION/CLEANINGS: Simple dental extractions (I.e. 1-2 teeth) are considered low risk procedures per guidelines and generally do not require any specific cardiac clearance. It is also generally accepted that for simple extractions and dental cleanings, there is no need to interrupt blood thinner therapy. Would not stop antiplatelet therapy at this time. Patient was seen in the office yesterday for chest pain with reassuring troponin level and was set up for coronary CT to re-evaluate his LAD proximal stent.   SBE prophylaxis is not required for the patient from a cardiac standpoint.  I will route this recommendation to the requesting party via Epic fax function and remove from pre-op pool.  Please call with questions.  Charlie Pitter, PA-C 04/13/2021, 8:05 AM

## 2021-04-13 NOTE — Telephone Encounter (Signed)
See  below from Dr. Theodosia Blender note at visit yesterday 10/25.   Assessment/Plan: 1. STEMI -  05/24/20 with one vessel LAD CAD - s/p PCI  - on DAPT  - needs to have simple dental extractions done>>will defer to dentistry as they usually can pull teeth on DAPT

## 2021-04-14 ENCOUNTER — Telehealth: Payer: Self-pay | Admitting: Cardiology

## 2021-04-14 NOTE — Telephone Encounter (Signed)
Patient wants to make sure he needs both the CT and the ETT. Please advise.

## 2021-04-14 NOTE — Telephone Encounter (Signed)
Called patient back about message. Patient wanted to know if he needs both stress test and cardiac CT. Consulted Dr. Radford Pax, she recommends getting stress test over the cardiac CT since patient's creatinine with elevated. Canceled cardiac CT. Informed patient of changes. Will send letter for test instructions. Patient verbalized understanding. Will send message to [name], so he is aware.

## 2021-04-15 NOTE — Progress Notes (Signed)
[facility] Note  04/20/2021     CHIEF COMPLAINT Patient presents for Post-op Follow-up   HISTORY OF PRESENT ILLNESS: Douglas [name] is a 65 y.o. male who presents to the clinic today for:   HPI     Post-op Follow-up   In left eye.  Discomfort includes none.  Vision is stable.  I, the attending physician,  performed the HPI with the patient and updated documentation appropriately.        Comments   65 y/o male pt here for 2 mo f/u.  S/p laser retinopexy for lattice w/atrophic holes OS on 8.25.22.  No change in [place] OU overall.  Occasionally, at random, images will look a little "wavy" OS.  Denies pain, FOL, floaters.  No gtts.      Last edited by Bernarda Caffey, MD on 04/23/2021  3:17 AM.      Referring physician: Debbra Riding, MD [address] [place],  [place] [address]  HISTORICAL INFORMATION:   Selected notes from the MEDICAL RECORD NUMBER Referred by Dr. Wyatt Portela for concern of retinal tears   CURRENT MEDICATIONS: No current outpatient medications on file. (Ophthalmic Drugs)   No current facility-administered medications for this visit. (Ophthalmic Drugs)   Current Outpatient Medications (Other)  Medication Sig   amoxicillin (AMOXIL) 500 MG capsule Take 500 mg by mouth 4 (four) times daily.   aspirin EC 81 MG tablet Take 1 tablet (81 mg total) by mouth daily. Swallow whole.   B Complex Vitamins (B COMPLEX 100 PO) Take 100 mg by mouth daily.   calcium carbonate (TUMS - DOSED IN MG ELEMENTAL CALCIUM) 500 MG chewable tablet Chew 500 mg by mouth 3 (three) times daily as needed for indigestion or heartburn.   carvedilol (COREG) 3.125 MG tablet TAKE 1 TABLET (3.125 MG TOTAL) BY MOUTH 2 (TWO) TIMES DAILY WITH A MEAL.   cetirizine (ZYRTEC) 10 MG tablet Take 5 mg by mouth daily.    cholecalciferol (VITAMIN D3) 25 MCG (1000 UNIT) tablet Take 2,000 Units by mouth daily.   clopidogrel (PLAVIX) 75 MG tablet Take 1 tablet (75 mg total)  by mouth daily.   denosumab (XGEVA) 120 MG/1.7ML SOLN injection Inject 120 mg into the skin every 30 (thirty) days.    DULoxetine (CYMBALTA) 20 MG capsule TAKE ONE CAPSULE BY MOUTH DAILY   HYDROcodone-acetaminophen (NORCO) 7.5-325 MG tablet Take 2 tablets by mouth See admin instructions. Take 2 every 4 hours through out the day until taking ativan at bedtime, do not mix   loperamide (IMODIUM A-D) 2 MG tablet Take 2 mg by mouth 4 (four) times daily as needed for diarrhea or loose stools.   LORazepam (ATIVAN) 1 MG tablet Take 0.5-1 tablets (0.5-1 mg total) by mouth at bedtime as needed for sleep. TAKE ONE HALF OR ONE TABLET BY MOUTH AT BEDTIME IF NEEDED FOR SLEEP, DO NOT TAKE WITH PAIN MEDS   Multiple Vitamins-Minerals (CENTRUM SILVER 50+MEN PO) Take 1 tablet by mouth daily.   nitroGLYCERIN (NITROSTAT) 0.4 MG SL tablet Place 1 tablet (0.4 mg total) under the tongue every 5 (five) minutes x 3 doses as needed for chest pain. Please dispense #25 x 4 bottle   Nivolumab (OPDIVO IV) Inject into the vein every 14 (fourteen) days.   ondansetron (ZOFRAN-ODT) 4 MG disintegrating tablet TAKE 1 TABLET BY MOUTH SUBLINGUALLY EVERY 8 HOURS AS NEEDED FOR NAUSEA OR VOMITING   pantoprazole (PROTONIX) 20 MG tablet Take 1 tablet (20  mg total) by mouth daily.   pseudoephedrine (SUDAFED) 30 MG tablet Take 30 mg by mouth every 4 (four) hours as needed for congestion.   rosuvastatin (CRESTOR) 10 MG tablet TAKE 1 TABLET BY MOUTH EVERY DAY   sacubitril-valsartan (ENTRESTO) 24-26 MG Take 1 tablet by mouth 2 (two) times daily.   traZODone (DESYREL) 50 MG tablet TAKE 0.5-1 TABLETS BY MOUTH AT BEDTIME AS NEEDED FOR SLEEP.   UNABLE TO FIND Take 1 tablet by mouth daily. Med Name: Calcium 600/D-3 300 2 daily   vitamin C (ASCORBIC ACID) 500 MG tablet Take 500 mg by mouth daily.   No current facility-administered medications for this visit. (Other)   REVIEW OF SYSTEMS: ROS   Positive for: Gastrointestinal, Neurological,  Musculoskeletal, Cardiovascular, Eyes, Respiratory Negative for: Constitutional, Skin, Genitourinary, HENT, Endocrine, Psychiatric, Allergic/Imm, Heme/Lymph Last edited by Matthew Folks, COA on 04/20/2021  9:55 AM.     ALLERGIES Allergies  Allergen Reactions   Avelox [Moxifloxacin Hcl In Nacl] Hives, Shortness Of Breath and Swelling   Medical Adhesive Remover Dermatitis and Rash   Tape Dermatitis and Rash    PAST MEDICAL HISTORY Past Medical History:  Diagnosis Date   Allergic rhinitis    Arthritis    "all over my body" (10/16/2013)   Cataract    Chronic back pain    "neck to lower back" (10/16/2013)   Compression fracture    S/P L-SPINE; pt does not recall this hx on 10/16/2013   COPD (chronic obstructive pulmonary disease) (HCC)    FVC .66   Coronary artery disease    Depression    "situational since 09/22/2013 OR"   Encounter for long-term (current) use of other medications    GERD (gastroesophageal reflux disease)    Hemoptysis    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)    History of blood transfusion 09/2013   "think so; not sure; related to big OR"   History of surgical fusion joint    DDD C-SPINE   Hyperlipidemia    Hypertension    Hypertensive retinopathy    Leiomyoma OF SKIN   INCREASED RISK RENAL CELL CA  NEEDS CT OF KIDNEYS EVERY 2 YEARS NEXT DUE  02/06/13   Melanoma in situ ([place])    Melanoma of back ([place])    Neuromuscular disorder ([place])    Osteoarthritis    Renal calculi    Renal cell carcinoma ([place])    type 2; papillary   Tobacco use disorder    Unspecified vitamin D deficiency    Past Surgical History:  Procedure Laterality Date   ANTERIOR CERVICAL DECOMP/DISCECTOMY FUSION  1992   ANTERIOR CRUCIATE LIGAMENT REPAIR Bilateral [phone]   APPENDECTOMY  09/22/2013   CARDIAC CATHETERIZATION     CHOLECYSTECTOMY  09/22/2013   "had tumors in it"   CORONARY STENT INTERVENTION N/A 05/24/2020   Procedure: CORONARY STENT  INTERVENTION;  Surgeon: Martinique, Peter M, MD;  Location: [facility];  Service: Cardiovascular;  Laterality: N/A;   HAND LIGAMENT RECONSTRUCTION Right    HARVEST BONE GRAFT Right 1992   "hip; for neck fusion"   INGUINAL HERNIA REPAIR Right 2004   LEFT HEART CATH AND CORONARY ANGIOGRAPHY N/A 05/24/2020   Procedure: LEFT HEART CATH AND CORONARY ANGIOGRAPHY;  Surgeon: Martinique, Peter M, MD;  Location: [facility];  Service: Cardiovascular;  Laterality: N/A;   LIGAMENT REPAIR Right ~ 2001   "little finger"   LYMPH NODE DISSECTION Right 09/22/2013   "in  the area of the kidney; they were clean"   MELANOMA EXCISION  ~ 2007   "off my back"   PARTIAL NEPHRECTOMY Right 09/22/2013   removal of fascia overlying the right psoas Right 09/22/2013   RENAL ARTERY STENT Right 09/22/2013   RIGHT COLECTOMY Right 09/22/2013   ROBOTIC ASSITED PARTIAL NEPHRECTOMY  05/22/2012   Procedure: ROBOTIC ASSITED PARTIAL NEPHRECTOMY;  Surgeon: Alexis Frock, MD;  Location: [facility];  Service: Urology;  Laterality: Right;  Right Robotic Cyst Decortication and Partial Nephrectomy    SPINE SURGERY      FAMILY HISTORY Family History  Adopted: Yes  Problem Relation Age of Onset   Blindness Mother    Glaucoma Mother    Hypertension Father     SOCIAL HISTORY Social History   Tobacco Use   Smoking status: Former    Packs/day: 0.50    Years: 40.00    Pack years: 20.00    Types: Cigarettes   Smokeless tobacco: Never  Vaping Use   Vaping Use: Never used  Substance Use Topics   Alcohol use: Not Currently   Drug use: Yes    Types: Marijuana       OPHTHALMIC EXAM:  Base Eye Exam     Visual Acuity (Snellen - Linear)       Right Left   Dist cc 20/20 -2 20/30   Dist ph cc  20/25 -2    Correction: Glasses         Tonometry (Tonopen, 9:59 AM)       Right Left   Pressure 12 12         Pupils       Dark Light Shape React APD   Right 4 3 Round Brisk None   Left 4 3 Round Brisk None          Visual Fields (Counting fingers)       Left Right    Full Full         Extraocular Movement       Right Left    Full, Ortho Full, Ortho         Neuro/Psych     Oriented x3: Yes   Mood/Affect: Normal         Dilation     Both eyes: 1.0% Mydriacyl, 2.5% Phenylephrine @ 9:59 AM           Slit Lamp and Fundus Exam     External Exam       Right Left   External Normal Normal         Slit Lamp Exam       Right Left   Lids/Lashes Mild Meibomian gland dysfunction, mild Telangiectasia Mild Meibomian gland dysfunction, mild Telangiectasia   Conjunctiva/Sclera White and quiet, Mild temp Pinguecula White and quiet, Mild temp Pinguecula   Cornea Mild , Punctate epithelial erosions Mild , Punctate epithelial erosions, arcus   Anterior Chamber Deep and quiet Deep and quiet   Iris Round and dilated Round and dilated   Lens 2+ Nuclear sclerosis, 2-3+ Cortical cataract 2+ Nuclear sclerosis, 2-3+ Cortical cataract   Vitreous Vitreous syneresis, Posterior vitreous detachment Vitreous syneresis, no pigment, Posterior vitreous detachment, Vitreous condensations         Fundus Exam       Right Left   Disc Pink and sharp, Compact, Temporal PPA Pink and sharp, Compact   C/D Ratio 0.2 0.2   Macula Flat, Blunted foveal reflex, no Heme, mild RPE mottling Flat, Blunted foveal  reflex, ERM, mild cystic changes, no Heme   Vessels Tortuous, Copper wiring, Vascular attenuation Tortuous, Copper wiring, Vascular attenuation   Periphery Attached, No heme Pigmented lattice inferiorly from 0500 to 0715 with large atrophic holes--positive SRF -- good laser changes surrounding, no new RT/RD            IMAGING AND PROCEDURES  Imaging and Procedures for 04/20/2021  OCT, Retina - OU - Both Eyes       Right Eye Quality was good. Central Foveal Thickness: 254. Progression has been stable. Findings include normal foveal contour, no IRF, no SRF.   Left Eye Quality was  good. Central Foveal Thickness: 293. Progression has been stable. Findings include abnormal foveal contour, no SRF, macular pucker, epiretinal membrane, lamellar hole, intraretinal fluid (Mild ERM with lamellar hole and central cystic changes-stable).   Notes *Images captured and stored on drive  Diagnosis / Impression:  OD: NFP; no IRF/SRF  OS: Mild ERM with lamellar hole and central cystic changes--stable  Clinical management:  See below  Abbreviations: NFP - Normal foveal profile. CME - cystoid macular edema. PED - pigment epithelial detachment. IRF - intraretinal fluid. SRF - subretinal fluid. EZ - ellipsoid zone. ERM - epiretinal membrane. ORA - outer retinal atrophy. ORT - outer retinal tubulation. SRHM - subretinal hyper-reflective material. IRHM - intraretinal hyper-reflective material              ASSESSMENT/PLAN:    ICD-10-CM   1. Lattice degeneration of left retina  H35.412     2. Retinal holes, left  H33.322     3. Retinal detachment, left  H33.22     4. Retinal edema  H35.81 OCT, Retina - OU - Both Eyes    5. Epiretinal membrane (ERM) of left eye  H35.372     6. Lamellar macular hole of left eye  H35.342     7. Essential hypertension  I10     8. Hypertensive retinopathy of both eyes  H35.033     9. Combined forms of age-related cataract of both eyes  H25.813      1-4. Lattice degeneration w/ atrophic holes, OS - Pigmented lattice inferiorly from 0500 to 0715 with large atrophic holes, positive SRF - s/p Retinopexy OS 08.25.22 --good laser changes surrounding  - monitor  5,6.   Epiretinal membrane w/ lamellar macular hole, left eye  - The natural history, anatomy, potential for loss of vision, and treatment options including vitrectomy techniques and the complications of endophthalmitis, retinal detachment, vitreous hemorrhage, cataract progression and permanent vision loss discussed with the patient. - mild ERM w/ lamellar hoe - BCVA 20/25 -  asymptomatic, no metamorphopsia - no indication for surgery at this time - monitor for now - F/u in 6 mos, sooner prn -- DFE&OCT  7,8. Hypertensive retinopathy OU - discussed importance of tight BP control - monitor   9. Mixed Cataract, OU - The symptoms of cataract, surgical options, and treatments and risks were discussed with patient. - discussed diagnosis and progression - not yet visually significant - monitor for now  Ophthalmic Meds Ordered this visit:  No orders of the defined types were placed in this encounter.     Return in about 6 months (around 10/18/2021) for 6 mo f/u for ERM OS w/DFE&OCT.  There are no Patient Instructions on file for this visit.  Explained the diagnoses, plan, and follow up with the patient and they expressed understanding.  Patient expressed understanding of the importance of proper follow up care.  This document serves as a record of services personally performed by Gardiner Sleeper, MD, PhD. It was created on their behalf by Orvan Falconer, an ophthalmic technician. The creation of this record is the provider's dictation and/or activities during the visit.    Electronically signed by: Orvan Falconer, OA, 04/23/21  3:17 AM  This document serves as a record of services personally performed by Gardiner Sleeper, MD, PhD. It was created on their behalf by Estill Bakes, COT an ophthalmic technician. The creation of this record is the provider's dictation and/or activities during the visit.    Electronically signed by: Estill Bakes, COT 11.2.22 @ 3:17 AM   Gardiner Sleeper, M.D., Ph.D. Diseases & Surgery of the Retina and [place] 11.2.22  I have reviewed the above documentation for accuracy and completeness, and I agree with the above. Gardiner Sleeper, M.D., Ph.D. 04/23/21 3:19 AM  Abbreviations: M myopia (nearsighted); A astigmatism; H hyperopia (farsighted); P presbyopia; Mrx spectacle prescription;  CTL  contact lenses; OD right eye; OS left eye; OU both eyes  XT exotropia; ET esotropia; PEK punctate epithelial keratitis; PEE punctate epithelial erosions; DES dry eye syndrome; MGD meibomian gland dysfunction; ATs artificial tears; PFAT's preservative free artificial tears; [place] nuclear sclerotic cataract; PSC posterior subcapsular cataract; ERM epi-retinal membrane; PVD posterior vitreous detachment; RD retinal detachment; DM diabetes mellitus; DR diabetic retinopathy; NPDR non-proliferative diabetic retinopathy; PDR proliferative diabetic retinopathy; CSME clinically significant macular edema; DME diabetic macular edema; dbh dot blot hemorrhages; CWS cotton wool spot; POAG primary open angle glaucoma; C/D cup-to-disc ratio; HVF humphrey visual field; GVF goldmann visual field; OCT optical coherence tomography; IOP intraocular pressure; BRVO Branch retinal vein occlusion; CRVO central retinal vein occlusion; CRAO central retinal artery occlusion; BRAO branch retinal artery occlusion; RT retinal tear; SB scleral buckle; PPV pars plana vitrectomy; VH Vitreous hemorrhage; PRP panretinal laser photocoagulation; IVK intravitreal kenalog; VMT vitreomacular traction; MH Macular hole;  NVD neovascularization of the disc; NVE neovascularization elsewhere; AREDS age related eye disease study; ARMD age related macular degeneration; POAG primary open angle glaucoma; EBMD epithelial/anterior basement membrane dystrophy; ACIOL anterior chamber intraocular lens; IOL intraocular lens; PCIOL posterior chamber intraocular lens; Phaco/IOL phacoemulsification with intraocular lens placement; [place] photorefractive keratectomy; LASIK laser assisted in situ keratomileusis; HTN hypertension; DM diabetes mellitus; COPD chronic obstructive pulmonary disease

## 2021-04-19 ENCOUNTER — Telehealth (HOSPITAL_COMMUNITY): Payer: Self-pay

## 2021-04-19 DIAGNOSIS — Z1509 Genetic susceptibility to other malignant neoplasm: Secondary | ICD-10-CM | POA: Diagnosis not present

## 2021-04-19 DIAGNOSIS — Z5112 Encounter for antineoplastic immunotherapy: Secondary | ICD-10-CM | POA: Diagnosis not present

## 2021-04-19 DIAGNOSIS — C641 Malignant neoplasm of right kidney, except renal pelvis: Secondary | ICD-10-CM | POA: Diagnosis not present

## 2021-04-19 DIAGNOSIS — C7951 Secondary malignant neoplasm of bone: Secondary | ICD-10-CM | POA: Diagnosis not present

## 2021-04-19 NOTE — Telephone Encounter (Signed)
Detailed instructions left on the patients answering machine. Asked to call back wit any questions. S.Douglas [name] EMTP

## 2021-04-20 ENCOUNTER — Other Ambulatory Visit: Payer: Self-pay

## 2021-04-20 ENCOUNTER — Ambulatory Visit (INDEPENDENT_AMBULATORY_CARE_PROVIDER_SITE_OTHER): Payer: Medicare HMO | Admitting: Ophthalmology

## 2021-04-20 ENCOUNTER — Encounter (INDEPENDENT_AMBULATORY_CARE_PROVIDER_SITE_OTHER): Payer: Self-pay | Admitting: Ophthalmology

## 2021-04-20 DIAGNOSIS — H35342 Macular cyst, hole, or pseudohole, left eye: Secondary | ICD-10-CM | POA: Diagnosis not present

## 2021-04-20 DIAGNOSIS — H35412 Lattice degeneration of retina, left eye: Secondary | ICD-10-CM | POA: Diagnosis not present

## 2021-04-20 DIAGNOSIS — H3322 Serous retinal detachment, left eye: Secondary | ICD-10-CM

## 2021-04-20 DIAGNOSIS — H33322 Round hole, left eye: Secondary | ICD-10-CM | POA: Diagnosis not present

## 2021-04-20 DIAGNOSIS — I10 Essential (primary) hypertension: Secondary | ICD-10-CM

## 2021-04-20 DIAGNOSIS — H35033 Hypertensive retinopathy, bilateral: Secondary | ICD-10-CM

## 2021-04-20 DIAGNOSIS — H35372 Puckering of macula, left eye: Secondary | ICD-10-CM

## 2021-04-20 DIAGNOSIS — H3581 Retinal edema: Secondary | ICD-10-CM

## 2021-04-20 DIAGNOSIS — H25813 Combined forms of age-related cataract, bilateral: Secondary | ICD-10-CM | POA: Diagnosis not present

## 2021-04-21 ENCOUNTER — Ambulatory Visit (HOSPITAL_COMMUNITY): Payer: Medicare HMO | Attending: Cardiovascular Disease

## 2021-04-21 DIAGNOSIS — I2510 Atherosclerotic heart disease of native coronary artery without angina pectoris: Secondary | ICD-10-CM | POA: Diagnosis not present

## 2021-04-21 DIAGNOSIS — R079 Chest pain, unspecified: Secondary | ICD-10-CM | POA: Diagnosis not present

## 2021-04-21 DIAGNOSIS — I2583 Coronary atherosclerosis due to lipid rich plaque: Secondary | ICD-10-CM | POA: Insufficient documentation

## 2021-04-21 LAB — MYOCARDIAL PERFUSION IMAGING
LV dias vol: 148 mL (ref 62–150)
LV sys vol: 85 mL
Nuc Stress EF: 42 %
Peak HR: 82 {beats}/min
Rest HR: 54 {beats}/min
Rest Nuclear Isotope Dose: 10.4 mCi
SDS: 1
SRS: 12
SSS: 14
ST Depression (mm): 0 mm
Stress Nuclear Isotope Dose: 31.3 mCi
TID: 1.03

## 2021-04-21 MED ORDER — TECHNETIUM TC 99M TETROFOSMIN IV KIT
31.3000 | PACK | Freq: Once | INTRAVENOUS | Status: AC | PRN
  Administered 2021-04-21: 31.3 via INTRAVENOUS
  Filled 2021-04-21: qty 32

## 2021-04-21 MED ORDER — REGADENOSON 0.4 MG/5ML IV SOLN
0.4000 mg | Freq: Once | INTRAVENOUS | Status: AC
  Administered 2021-04-21: 0.4 mg via INTRAVENOUS

## 2021-04-21 MED ORDER — TECHNETIUM TC 99M TETROFOSMIN IV KIT
10.4000 | PACK | Freq: Once | INTRAVENOUS | Status: AC | PRN
  Administered 2021-04-21: 10.4 via INTRAVENOUS
  Filled 2021-04-21: qty 11

## 2021-04-22 ENCOUNTER — Ambulatory Visit (HOSPITAL_COMMUNITY): Payer: Medicare HMO

## 2021-04-23 ENCOUNTER — Encounter (INDEPENDENT_AMBULATORY_CARE_PROVIDER_SITE_OTHER): Payer: Self-pay | Admitting: Ophthalmology

## 2021-04-28 NOTE — Telephone Encounter (Signed)
Patient has been notified of results.  

## 2021-05-02 ENCOUNTER — Ambulatory Visit (INDEPENDENT_AMBULATORY_CARE_PROVIDER_SITE_OTHER): Payer: Medicare HMO | Admitting: Gastroenterology

## 2021-05-02 ENCOUNTER — Encounter: Payer: Self-pay | Admitting: Gastroenterology

## 2021-05-02 VITALS — BP 98/70 | HR 88 | Ht 71.25 in | Wt 169.1 lb

## 2021-05-02 DIAGNOSIS — Z7902 Long term (current) use of antithrombotics/antiplatelets: Secondary | ICD-10-CM | POA: Diagnosis not present

## 2021-05-02 DIAGNOSIS — Z1509 Genetic susceptibility to other malignant neoplasm: Secondary | ICD-10-CM

## 2021-05-02 DIAGNOSIS — Z86010 Personal history of colonic polyps: Secondary | ICD-10-CM

## 2021-05-02 DIAGNOSIS — I252 Old myocardial infarction: Secondary | ICD-10-CM

## 2021-05-02 NOTE — Patient Instructions (Addendum)
If you are age 66 or older, your body mass index should be between 23-30. Your Body mass index is 23.42 kg/m. If this is out of the aforementioned range listed, please consider follow up with your Primary Care Provider.  If you are age 58 or younger, your body mass index should be between 19-25. Your Body mass index is 23.42 kg/m. If this is out of the aformentioned range listed, please consider follow up with your Primary Care Provider.   ________________________________________________________  The [facility] providers would like to encourage you to use [facility] to communicate with providers for non-urgent requests or questions.  Due to long hold times on the telephone, sending your provider a message by [facility] may be a faster and more efficient way to get a response.  Please allow 48 business hours for a response.  Please remember that this is for non-urgent requests.  _______________________________________________________  Per our discussion, we are holding off on colonoscopy for now - patient will see oncologist, await CT scans, and see cardiology to determine if and when we do a procedure.    Thank you for entrusting me with your care and for choosing [facility], Dr. [place] Cellar

## 2021-05-02 NOTE — Progress Notes (Signed)
HPI :  65 y.o. with hereditary leiomyomatosis renal cell cancer metastatic to liver, bone. Previously followed at [facility] for years for a clinical trial involving Tarceva and Avastin.  He had a history of colonoscopy in 2013 with 2 small adenomas.  I saw him in 2018 for discussion of surveillance colonoscopy.  At that time following discussion of this issue we decided to hold off on colonoscopy as that would entail holding his Avastin for a few weeks, and he did not think that was worth it.  Unfortunately since have last seen him he had an MI at the end of 2021.  He had a STEMI with DES to proximal LAD 05/24/20 and residual 50% Ramus. EF was 35-40%. Due to CAD and DAT he was taken off his Avastin and Tarceva both contraindicated with ischemic heart disease.  He unfortunately had to stop his trial at the [facility] and has resumed his oncology care with [facility]. He has since started another trial with nivolumab therapy.  He has been on this since this past April.  He states he is tolerating the regimen okay but has horrible fatigue.  He previously had diarrhea on his other regimen but states that has since resolved.  His bowels are pretty regular.  He denies any blood in stools that he can see.  He denies any family history of known malignancy as he is adopted.  He is here again today to discuss if he needs a surveillance colonoscopy.  He remains anxious about being overdue for this.  He had a CT chest abdomen pelvis at [facility] in July.  He has an 8.3 cm dominant lesion in the dome of his liver along with a 7.6 cm lesion in the right posterior lobe, and a 3.1 cm lesion in the inferior right lobe.  No new liver lesions, 2 of the lesions appear smaller than previous.  He is interval progression of disease.  He had labs done on November 1 showing an alk phos of 30 but LFTs otherwise normal.  Albumin 4.1.  Hemoglobin of 14.2 with an MCV of 88.  He otherwise denies any abdominal pain.  Fatigue appears to be  his main issue at this time.  He is a scheduled to have dual antiplatelet therapy for at least 1 year.  States weight is stable.  He is also anxious about anesthesia risks in light of his recent heart attack.   MRI 12/01/20 reviewed EF 39% anterior/septal/apical scar   Colonoscopy 10/09/2011 - Dr. Olevia Perches - 5 polyps removed - 2 adenomas, 3 hyperplastic   Echocardiogram 10/04/20 - EF 35-40%, grade I DD   Past Medical History:  Diagnosis Date   Allergic rhinitis    Anxiety    Arthritis    "all over my body" (10/16/2013)   Cataract    Chronic back pain    "neck to lower back" (10/16/2013)   Colon polyps    Compression fracture    S/P L-SPINE; pt does not recall this hx on 10/16/2013   COPD (chronic obstructive pulmonary disease) ([place])    FVC .66   Coronary artery disease    Depression    "situational since 09/22/2013 OR"   Encounter for long-term (current) use of other medications    GERD (gastroesophageal reflux disease)    Hemoptysis    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)    History of blood transfusion 09/2013   "think so; not sure; related to big OR"   History of surgical  fusion joint    DDD C-SPINE   Hx of blood clots    Hyperlipidemia    Hypertension    Hypertensive retinopathy    Leiomyoma OF SKIN   INCREASED RISK RENAL CELL CA  NEEDS CT OF KIDNEYS EVERY 2 YEARS NEXT DUE  02/06/13   Melanoma in situ ([place])    Melanoma of back ([place])    Neuromuscular disorder ([place])    Osteoarthritis    Renal calculi    Renal cell carcinoma ([place])    type 2; papillary (metastized to bone)   Tobacco use disorder    Unspecified vitamin D deficiency      Past Surgical History:  Procedure Laterality Date   ANTERIOR CERVICAL DECOMP/DISCECTOMY FUSION  1992   ANTERIOR CRUCIATE LIGAMENT REPAIR Bilateral [phone]   APPENDECTOMY  09/22/2013   CARDIAC CATHETERIZATION     CHOLECYSTECTOMY  09/22/2013   "had tumors in it"   CORONARY STENT INTERVENTION N/A  05/24/2020   Procedure: CORONARY STENT INTERVENTION;  Surgeon: Martinique, Peter M, MD;  Location: [facility];  Service: Cardiovascular;  Laterality: N/A;   HAND LIGAMENT RECONSTRUCTION Right    HARVEST BONE GRAFT Right 1992   "hip; for neck fusion"   INGUINAL HERNIA REPAIR Right 2004   KNEE SURGERY Right 1974   KNEE SURGERY Left 1975   LEFT HEART CATH AND CORONARY ANGIOGRAPHY N/A 05/24/2020   Procedure: LEFT HEART CATH AND CORONARY ANGIOGRAPHY;  Surgeon: Martinique, Peter M, MD;  Location: [facility];  Service: Cardiovascular;  Laterality: N/A;   LIGAMENT REPAIR Right ~ 2001   "little finger"   LYMPH NODE DISSECTION Right 09/22/2013   "in the area of the kidney; they were clean"   MELANOMA EXCISION  ~ 2007   "off my back"   PARTIAL NEPHRECTOMY Right 09/22/2013   removal of fascia overlying the right psoas Right 09/22/2013   RENAL ARTERY STENT Right 09/22/2013   RIGHT COLECTOMY Right 09/22/2013   ROBOTIC ASSITED PARTIAL NEPHRECTOMY  05/22/2012   Procedure: ROBOTIC ASSITED PARTIAL NEPHRECTOMY;  Surgeon: Alexis Frock, MD;  [facility];  Service: Urology;  Laterality: Right;  Right Robotic Cyst Decortication and Partial Nephrectomy    SPINE SURGERY     Family History  Adopted: Yes  Problem Relation Age of Onset   Blindness Mother    Glaucoma Mother    Hypertension Father    Social History   Tobacco Use   Smoking status: Former    Packs/day: 0.50    Years: 40.00    Pack years: 20.00    Types: Cigarettes   Smokeless tobacco: Never  Vaping Use   Vaping Use: Never used  Substance Use Topics   Alcohol use: Not Currently   Drug use: Yes    Types: Marijuana   Current Outpatient Medications  Medication Sig Dispense Refill   B Complex Vitamins (B COMPLEX 100 PO) Take 100 mg by mouth daily.     Calcium Carb-Cholecalciferol (CALCIUM 600/VITAMIN D PO) Take 1 capsule by mouth daily.     carvedilol (COREG) 3.125 MG tablet TAKE 1 TABLET (3.125 MG TOTAL) BY MOUTH 2  (TWO) TIMES DAILY WITH A MEAL. 180 tablet 2   cetirizine (ZYRTEC) 10 MG tablet Take 10 mg by mouth daily.     cholecalciferol (VITAMIN D3) 25 MCG (1000 UNIT) tablet Take 2,000 Units by mouth daily.     clopidogrel (PLAVIX) 75 MG tablet Take 1 tablet (75 mg total) by mouth daily. 30 tablet  11   denosumab (XGEVA) 120 MG/1.7ML SOLN injection Inject 120 mg into the skin every 30 (thirty) days.      DULoxetine (CYMBALTA) 20 MG capsule TAKE ONE CAPSULE BY MOUTH DAILY 90 capsule 3   HYDROcodone-acetaminophen (NORCO) 7.5-325 MG tablet Take 2 tablets by mouth See admin instructions. Take 2 every 4 hours through out the day until taking ativan at bedtime, do not mix 240 tablet 0   loperamide (IMODIUM A-D) 2 MG tablet Take 2 mg by mouth 4 (four) times daily as needed for diarrhea or loose stools.     LORazepam (ATIVAN) 1 MG tablet Take 0.5-1 tablets (0.5-1 mg total) by mouth at bedtime as needed for sleep. TAKE ONE HALF OR ONE TABLET BY MOUTH AT BEDTIME IF NEEDED FOR SLEEP, DO NOT TAKE WITH PAIN MEDS 30 tablet 3   Multiple Vitamins-Minerals (CENTRUM SILVER 50+MEN PO) Take 1 tablet by mouth daily.     Nivolumab (OPDIVO IV) Inject into the vein every 14 (fourteen) days.     ondansetron (ZOFRAN-ODT) 4 MG disintegrating tablet TAKE 1 TABLET BY MOUTH SUBLINGUALLY EVERY 8 HOURS AS NEEDED FOR NAUSEA OR VOMITING 40 tablet 2   pantoprazole (PROTONIX) 20 MG tablet Take 1 tablet (20 mg total) by mouth daily. 90 tablet 3   penicillin v potassium (VEETID) 500 MG tablet Take 500 mg by mouth 4 (four) times daily.     pseudoephedrine (SUDAFED) 30 MG tablet Take 30 mg by mouth every 4 (four) hours as needed for congestion.     rosuvastatin (CRESTOR) 10 MG tablet TAKE 1 TABLET BY MOUTH EVERY DAY 90 tablet 2   sacubitril-valsartan (ENTRESTO) 24-26 MG Take 1 tablet by mouth 2 (two) times daily. 60 tablet 6   traZODone (DESYREL) 50 MG tablet TAKE 0.5-1 TABLETS BY MOUTH AT BEDTIME AS NEEDED FOR SLEEP. 90 tablet 2   vitamin C  (ASCORBIC ACID) 500 MG tablet Take 500 mg by mouth daily.     aspirin EC 81 MG tablet Take 1 tablet (81 mg total) by mouth daily. Swallow whole.     calcium carbonate (TUMS - DOSED IN MG ELEMENTAL CALCIUM) 500 MG chewable tablet Chew 500 mg by mouth 3 (three) times daily as needed for indigestion or heartburn.     nitroGLYCERIN (NITROSTAT) 0.4 MG SL tablet Place 1 tablet (0.4 mg total) under the tongue every 5 (five) minutes x 3 doses as needed for chest pain. Please dispense #25 x 4 bottle (Patient not taking: Reported on 05/02/2021) 100 tablet 1   No current facility-administered medications for this visit.   Allergies  Allergen Reactions   Avelox [Moxifloxacin Hcl In Nacl] Hives, Shortness Of Breath and Swelling   Medical Adhesive Remover Dermatitis and Rash   Tape Dermatitis and Rash     Review of Systems: All systems reviewed and negative except where noted in HPI.   See care everywhere for labs  Physical Exam: BP 98/70 (BP Location: Right Arm, Patient Position: Sitting, Cuff Size: Normal)   Pulse 88   Ht 5' 11.25" (1.81 m) Comment: height measured without shoes  Wt 169 lb 2 oz (76.7 kg)   BMI 23.42 kg/m  Constitutional: Pleasant,well-developed, male in no acute distress. HEENT: Normocephalic and atraumatic. Conjunctivae are normal. No scleral icterus. Neck supple.  Cardiovascular: Normal rate, regular rhythm.  Pulmonary/chest: Effort normal and breath sounds normal. Abdominal: Soft, nondistended, nontender. Ventral hernia noted mid abdomen. Extremities: no edema Lymphadenopathy: No cervical adenopathy noted. Neurological: Alert and oriented to person place and  time. Skin: Skin is warm and dry. No rashes noted. Psychiatric: Normal mood and affect. Behavior is normal.   ASSESSMENT AND PLAN: 65 year old male here to reestablish care for the following:  History of colon polyps Hereditary leiomyomatosis and renal cell cancer History of CAD - MI Antiplatelet use  As  above patient with a longstanding history of metastatic leiomyomatosis / renal cell cancer.  He was at a clinical trial at [facility] for more than 5 years, states his survival has long surpassed expectations initially.  Unfortunately his course was complicated by an MI in the setting of Avastin last year, he had to stop the clinical trial, had coronary stenting as outlined, now on Plavix.  This past April was placed on nivolumab.  He has multiple liver lesions at present, although appears stable disease burden if not decreased in size from last imaging.  He is scheduled to have further surveillance CT scans this month and to see cardiology in the next month or 2.  We discussed potential surveillance colonoscopy.  He has no anemia, no bowel symptoms, his colon looks normal on the CT scan.  He is a bit anxious about getting anesthesia in light of his cardiac history.  We discussed risks and benefits of surveillance colonoscopy.  I think it will be best for him to see his oncologist this month and have additional surveillance imaging to assess the status of his malignancy, and have this conversation with his oncologist to determine if we should be doing any surveillance colonoscopy, in regards to his prognosis for his current malignancy.  He has done quite well with this disease in recent years, however he does have a significant disease burden.  Further given his dual antiplatelet therapy, his Plavix will need to be held for 5 days and he may not be cleared to do that for another month or so.  He should ask his cardiologist about this as well.  I told the patient I think it is unlikely that he has colon cancer at current time given imaging, lack of symptoms, and his blood counts, which is a concern of his, however possible he has colon polyps given his history.  I think risks of colonoscopy may outweigh benefits at present time, however pending discussion with his oncologist about prognosis, if they think he would benefit  from another exam he will contact me after his visit with them.  Several minutes discussing this issue today, he understands, agrees with the plan and will contact me if he wishes to pursue surveillance at some point.  Jolly Mango, MD [facility]  CC: Copland, Gay Filler, MD

## 2021-05-03 DIAGNOSIS — D6489 Other specified anemias: Secondary | ICD-10-CM | POA: Diagnosis not present

## 2021-05-03 DIAGNOSIS — Z1509 Genetic susceptibility to other malignant neoplasm: Secondary | ICD-10-CM | POA: Diagnosis not present

## 2021-05-03 DIAGNOSIS — R2681 Unsteadiness on feet: Secondary | ICD-10-CM | POA: Diagnosis not present

## 2021-05-03 DIAGNOSIS — C7951 Secondary malignant neoplasm of bone: Secondary | ICD-10-CM | POA: Diagnosis not present

## 2021-05-03 DIAGNOSIS — Z5112 Encounter for antineoplastic immunotherapy: Secondary | ICD-10-CM | POA: Diagnosis not present

## 2021-05-03 DIAGNOSIS — C649 Malignant neoplasm of unspecified kidney, except renal pelvis: Secondary | ICD-10-CM | POA: Diagnosis not present

## 2021-05-03 DIAGNOSIS — D630 Anemia in neoplastic disease: Secondary | ICD-10-CM | POA: Diagnosis not present

## 2021-05-03 DIAGNOSIS — D481 Neoplasm of uncertain behavior of connective and other soft tissue: Secondary | ICD-10-CM | POA: Diagnosis not present

## 2021-05-03 DIAGNOSIS — L308 Other specified dermatitis: Secondary | ICD-10-CM | POA: Diagnosis not present

## 2021-05-04 DIAGNOSIS — M19012 Primary osteoarthritis, left shoulder: Secondary | ICD-10-CM | POA: Diagnosis not present

## 2021-05-07 ENCOUNTER — Other Ambulatory Visit: Payer: Self-pay | Admitting: Cardiology

## 2021-05-10 ENCOUNTER — Encounter: Payer: Self-pay | Admitting: Cardiovascular Disease

## 2021-05-10 ENCOUNTER — Encounter: Payer: Self-pay | Admitting: Family Medicine

## 2021-05-10 DIAGNOSIS — G893 Neoplasm related pain (acute) (chronic): Secondary | ICD-10-CM

## 2021-05-10 DIAGNOSIS — C641 Malignant neoplasm of right kidney, except renal pelvis: Secondary | ICD-10-CM

## 2021-05-10 MED ORDER — HYDROCODONE-ACETAMINOPHEN 7.5-325 MG PO TABS: 2.0000 | ORAL_TABLET | ORAL | 0 refills | Status: DC

## 2021-05-17 DIAGNOSIS — C7951 Secondary malignant neoplasm of bone: Secondary | ICD-10-CM | POA: Diagnosis not present

## 2021-05-17 DIAGNOSIS — Z5112 Encounter for antineoplastic immunotherapy: Secondary | ICD-10-CM | POA: Diagnosis not present

## 2021-05-17 DIAGNOSIS — C649 Malignant neoplasm of unspecified kidney, except renal pelvis: Secondary | ICD-10-CM | POA: Diagnosis not present

## 2021-05-25 DIAGNOSIS — I700 Atherosclerosis of aorta: Secondary | ICD-10-CM | POA: Diagnosis not present

## 2021-05-25 DIAGNOSIS — J439 Emphysema, unspecified: Secondary | ICD-10-CM | POA: Diagnosis not present

## 2021-05-25 DIAGNOSIS — M19071 Primary osteoarthritis, right ankle and foot: Secondary | ICD-10-CM | POA: Diagnosis not present

## 2021-05-25 DIAGNOSIS — K839 Disease of biliary tract, unspecified: Secondary | ICD-10-CM | POA: Diagnosis not present

## 2021-05-25 DIAGNOSIS — C7951 Secondary malignant neoplasm of bone: Secondary | ICD-10-CM | POA: Diagnosis not present

## 2021-05-25 DIAGNOSIS — C649 Malignant neoplasm of unspecified kidney, except renal pelvis: Secondary | ICD-10-CM | POA: Diagnosis not present

## 2021-05-25 DIAGNOSIS — I739 Peripheral vascular disease, unspecified: Secondary | ICD-10-CM | POA: Diagnosis not present

## 2021-05-25 DIAGNOSIS — I2510 Atherosclerotic heart disease of native coronary artery without angina pectoris: Secondary | ICD-10-CM | POA: Diagnosis not present

## 2021-05-25 DIAGNOSIS — K5730 Diverticulosis of large intestine without perforation or abscess without bleeding: Secondary | ICD-10-CM | POA: Diagnosis not present

## 2021-05-25 DIAGNOSIS — K769 Liver disease, unspecified: Secondary | ICD-10-CM | POA: Diagnosis not present

## 2021-05-25 DIAGNOSIS — Z5112 Encounter for antineoplastic immunotherapy: Secondary | ICD-10-CM | POA: Diagnosis not present

## 2021-05-25 DIAGNOSIS — M19012 Primary osteoarthritis, left shoulder: Secondary | ICD-10-CM | POA: Diagnosis not present

## 2021-05-25 DIAGNOSIS — M19011 Primary osteoarthritis, right shoulder: Secondary | ICD-10-CM | POA: Diagnosis not present

## 2021-05-25 DIAGNOSIS — M47816 Spondylosis without myelopathy or radiculopathy, lumbar region: Secondary | ICD-10-CM | POA: Diagnosis not present

## 2021-05-25 DIAGNOSIS — Z1509 Genetic susceptibility to other malignant neoplasm: Secondary | ICD-10-CM | POA: Diagnosis not present

## 2021-05-25 DIAGNOSIS — K7689 Other specified diseases of liver: Secondary | ICD-10-CM | POA: Diagnosis not present

## 2021-05-25 DIAGNOSIS — K5790 Diverticulosis of intestine, part unspecified, without perforation or abscess without bleeding: Secondary | ICD-10-CM | POA: Diagnosis not present

## 2021-05-31 DIAGNOSIS — D630 Anemia in neoplastic disease: Secondary | ICD-10-CM | POA: Diagnosis not present

## 2021-05-31 DIAGNOSIS — D481 Neoplasm of uncertain behavior of connective and other soft tissue: Secondary | ICD-10-CM | POA: Diagnosis not present

## 2021-05-31 DIAGNOSIS — C649 Malignant neoplasm of unspecified kidney, except renal pelvis: Secondary | ICD-10-CM | POA: Diagnosis not present

## 2021-05-31 DIAGNOSIS — Z1509 Genetic susceptibility to other malignant neoplasm: Secondary | ICD-10-CM | POA: Diagnosis not present

## 2021-05-31 DIAGNOSIS — R5383 Other fatigue: Secondary | ICD-10-CM | POA: Diagnosis not present

## 2021-05-31 DIAGNOSIS — Z5112 Encounter for antineoplastic immunotherapy: Secondary | ICD-10-CM | POA: Diagnosis not present

## 2021-05-31 DIAGNOSIS — C7951 Secondary malignant neoplasm of bone: Secondary | ICD-10-CM | POA: Diagnosis not present

## 2021-05-31 DIAGNOSIS — R2681 Unsteadiness on feet: Secondary | ICD-10-CM | POA: Diagnosis not present

## 2021-06-06 DIAGNOSIS — D485 Neoplasm of uncertain behavior of skin: Secondary | ICD-10-CM | POA: Diagnosis not present

## 2021-06-06 DIAGNOSIS — L820 Inflamed seborrheic keratosis: Secondary | ICD-10-CM | POA: Diagnosis not present

## 2021-06-06 DIAGNOSIS — D692 Other nonthrombocytopenic purpura: Secondary | ICD-10-CM | POA: Diagnosis not present

## 2021-06-06 DIAGNOSIS — L570 Actinic keratosis: Secondary | ICD-10-CM | POA: Diagnosis not present

## 2021-06-06 DIAGNOSIS — L821 Other seborrheic keratosis: Secondary | ICD-10-CM | POA: Diagnosis not present

## 2021-06-07 ENCOUNTER — Encounter: Payer: Self-pay | Admitting: Family Medicine

## 2021-06-07 DIAGNOSIS — C641 Malignant neoplasm of right kidney, except renal pelvis: Secondary | ICD-10-CM

## 2021-06-07 DIAGNOSIS — G893 Neoplasm related pain (acute) (chronic): Secondary | ICD-10-CM

## 2021-06-07 MED ORDER — HYDROCODONE-ACETAMINOPHEN 7.5-325 MG PO TABS: 2.0000 | ORAL_TABLET | ORAL | 0 refills | Status: DC

## 2021-06-07 NOTE — Addendum Note (Signed)
Addended by: Lamar Blinks C on: 06/07/2021 06:10 PM   Modules accepted: Orders

## 2021-06-14 DIAGNOSIS — Z5112 Encounter for antineoplastic immunotherapy: Secondary | ICD-10-CM | POA: Diagnosis not present

## 2021-06-14 DIAGNOSIS — C649 Malignant neoplasm of unspecified kidney, except renal pelvis: Secondary | ICD-10-CM | POA: Diagnosis not present

## 2021-06-22 ENCOUNTER — Encounter: Payer: Self-pay | Admitting: Family Medicine

## 2021-06-22 DIAGNOSIS — G893 Neoplasm related pain (acute) (chronic): Secondary | ICD-10-CM

## 2021-06-22 DIAGNOSIS — F5101 Primary insomnia: Secondary | ICD-10-CM

## 2021-06-22 DIAGNOSIS — C641 Malignant neoplasm of right kidney, except renal pelvis: Secondary | ICD-10-CM

## 2021-06-22 DIAGNOSIS — F329 Major depressive disorder, single episode, unspecified: Secondary | ICD-10-CM

## 2021-06-22 MED ORDER — HYDROCODONE-ACETAMINOPHEN 7.5-325 MG PO TABS: 2.0000 | ORAL_TABLET | ORAL | 0 refills | Status: DC

## 2021-06-22 MED ORDER — TRAZODONE HCL 50 MG PO TABS: ORAL_TABLET | ORAL | 3 refills | Status: DC

## 2021-06-22 MED ORDER — LORAZEPAM 1 MG PO TABS: 0.5000 mg | ORAL_TABLET | Freq: Every evening | ORAL | 3 refills | Status: DC | PRN

## 2021-06-28 DIAGNOSIS — Z5112 Encounter for antineoplastic immunotherapy: Secondary | ICD-10-CM | POA: Diagnosis not present

## 2021-06-28 DIAGNOSIS — D481 Neoplasm of uncertain behavior of connective and other soft tissue: Secondary | ICD-10-CM | POA: Diagnosis not present

## 2021-06-28 DIAGNOSIS — R5383 Other fatigue: Secondary | ICD-10-CM | POA: Diagnosis not present

## 2021-06-28 DIAGNOSIS — C649 Malignant neoplasm of unspecified kidney, except renal pelvis: Secondary | ICD-10-CM | POA: Diagnosis not present

## 2021-06-28 DIAGNOSIS — D649 Anemia, unspecified: Secondary | ICD-10-CM | POA: Diagnosis not present

## 2021-06-28 DIAGNOSIS — D630 Anemia in neoplastic disease: Secondary | ICD-10-CM | POA: Diagnosis not present

## 2021-06-28 DIAGNOSIS — L309 Dermatitis, unspecified: Secondary | ICD-10-CM | POA: Diagnosis not present

## 2021-06-28 DIAGNOSIS — C7951 Secondary malignant neoplasm of bone: Secondary | ICD-10-CM | POA: Diagnosis not present

## 2021-06-28 DIAGNOSIS — R2689 Other abnormalities of gait and mobility: Secondary | ICD-10-CM | POA: Diagnosis not present

## 2021-07-12 DIAGNOSIS — C7951 Secondary malignant neoplasm of bone: Secondary | ICD-10-CM | POA: Diagnosis not present

## 2021-07-12 DIAGNOSIS — Z5112 Encounter for antineoplastic immunotherapy: Secondary | ICD-10-CM | POA: Diagnosis not present

## 2021-07-12 DIAGNOSIS — C649 Malignant neoplasm of unspecified kidney, except renal pelvis: Secondary | ICD-10-CM | POA: Diagnosis not present

## 2021-07-16 ENCOUNTER — Other Ambulatory Visit: Payer: Self-pay | Admitting: Family Medicine

## 2021-07-16 DIAGNOSIS — R110 Nausea: Secondary | ICD-10-CM

## 2021-07-19 ENCOUNTER — Other Ambulatory Visit: Payer: Self-pay | Admitting: Cardiovascular Disease

## 2021-07-19 MED ORDER — ENTRESTO 24-26 MG PO TABS: 1.0000 | ORAL_TABLET | Freq: Two times a day (BID) | ORAL | 7 refills | Status: DC

## 2021-07-22 NOTE — Progress Notes (Signed)
CARDIOLOGY OFFICE NOTE  Date:  07/22/2021    Arlyce Harman Date of Birth: Nov 05, 1955 Medical Record #[id number]  PCP:  Darreld Mclean, MD  Cardiologist:  Johnsie Cancel  No chief complaint on file.    History of Present Illness:  66 y.o. 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 Followed at [facility] for years  In clinical trials. Has been on Tarceva and Avastin .  Also has a hx of ASCAD with STEMI with DES to proximal LAD 05/24/20 And residual 50% Ramus. EF was 35-40%. Due to CAD and DAT he was taken off his Avastin and Tarceva both contraindicated with ischemic heart disease. Started on [place] and coreg no aldactone due to soft BP, lack of volume overload and renal dx  MRI 08/27/20 E 31% LAD scar no mural apical thrombus  Eliquis stopped  Discussed possible need for AICD in future. Needs to f[facility]u [facility] for alternative Rx cancer in light of his CAD and moderately reduced EF  09/09/20 Dr Franki Monte [phone] called directly to discuss She is contemplating using Nivolumab as single Agent immunoRx This drug has a small risk of myocarditis but is certainly safer than avastin and tarceva  09/23/20 spoke with Dr Samul Dada at [facility] who is apparently involved now and starting Nivolumab every 2 weeks I told him we would plan on f/u Cardiac MRI June   Has now started Nivolumab/Opdivo has more congestion and MSK pains on it Noted 21-42% incidence of this side effect with drug Also more GERD. Discussed being ok to take any 2 nd generation antihistamine with sudafed  if needed   TTE reviewed 10/04/20 EF 35-40%  MRI 12/01/20 reviewed EF 39% anterior/septal/apical scar   Seen by Dr Radford Pax  04/12/21 for chest pain Atypical Myovue with no ischemia LAD infarct EF 42%  Lots of joint pain from optivo Trying to get in phase one trial at [facility] for new Rx since his current Rx is not working well with more abdominal /liver growth  Past Medical History:  Diagnosis Date   Allergic rhinitis     Anxiety    Arthritis    "all over my body" (10/16/2013)   Cataract    Chronic back pain    "neck to lower back" (10/16/2013)   Colon polyps    Compression fracture    S/P L-SPINE; pt does not recall this hx on 10/16/2013   COPD (chronic obstructive pulmonary disease) (HCC)    FVC .66   Coronary artery disease    Depression    "situational since 09/22/2013 OR"   Encounter for long-term (current) use of other medications    GERD (gastroesophageal reflux disease)    Hemoptysis    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)    History of blood transfusion 09/2013   "think so; not sure; related to big OR"   History of surgical fusion joint    DDD C-SPINE   Hx of blood clots    Hyperlipidemia    Hypertension    Hypertensive retinopathy    Leiomyoma OF SKIN   INCREASED RISK RENAL CELL CA  NEEDS CT OF KIDNEYS EVERY 2 YEARS NEXT DUE  02/06/13   Melanoma in situ ([place])    Melanoma of back ([place])    Neuromuscular disorder ([place])    Osteoarthritis    Renal calculi    Renal cell carcinoma ([place])    type 2; papillary (metastized to bone)   Tobacco use disorder    Unspecified vitamin D  deficiency     Past Surgical History:  Procedure Laterality Date   ANTERIOR CERVICAL DECOMP/DISCECTOMY FUSION  1992   ANTERIOR CRUCIATE LIGAMENT REPAIR Bilateral [phone]   APPENDECTOMY  09/22/2013   CARDIAC CATHETERIZATION     CHOLECYSTECTOMY  09/22/2013   "had tumors in it"   CORONARY STENT INTERVENTION N/A 05/24/2020   Procedure: CORONARY STENT INTERVENTION;  Surgeon: Martinique, Shasta Chinn M, MD;  Location: [facility];  Service: Cardiovascular;  Laterality: N/A;   HAND LIGAMENT RECONSTRUCTION Right    HARVEST BONE GRAFT Right 1992   "hip; for neck fusion"   INGUINAL HERNIA REPAIR Right 2004   KNEE SURGERY Right 1974   KNEE SURGERY Left 1975   LEFT HEART CATH AND CORONARY ANGIOGRAPHY N/A 05/24/2020   Procedure: LEFT HEART CATH AND CORONARY ANGIOGRAPHY;  Surgeon:  Martinique, Herman Mell M, MD;  Location: [facility];  Service: Cardiovascular;  Laterality: N/A;   LIGAMENT REPAIR Right ~ 2001   "little finger"   LYMPH NODE DISSECTION Right 09/22/2013   "in the area of the kidney; they were clean"   MELANOMA EXCISION  ~ 2007   "off my back"   PARTIAL NEPHRECTOMY Right 09/22/2013   removal of fascia overlying the right psoas Right 09/22/2013   RENAL ARTERY STENT Right 09/22/2013   RIGHT COLECTOMY Right 09/22/2013   ROBOTIC ASSITED PARTIAL NEPHRECTOMY  05/22/2012   Procedure: ROBOTIC ASSITED PARTIAL NEPHRECTOMY;  Surgeon: Alexis Frock, MD;  [facility];  Service: Urology;  Laterality: Right;  Right Robotic Cyst Decortication and Partial Nephrectomy    SPINE SURGERY       Medications: No outpatient medications have been marked as taking for the 07/29/21 encounter (Appointment) with Josue Hector, MD.     Allergies: Allergies  Allergen Reactions   Avelox [Moxifloxacin Hcl In Nacl] Hives, Shortness Of Breath and Swelling   Medical Adhesive Remover Dermatitis and Rash   Tape Dermatitis and Rash    Social History: The patient  reports that he has quit smoking. His smoking use included cigarettes. He has a 20.00 pack-year smoking history. He has never used smokeless tobacco. He reports that he does not currently use alcohol. He reports current drug use. Drug: Marijuana.   Family History: The patient's family history includes Blindness in his mother; Glaucoma in his mother; Hypertension in his father. He was adopted.   Review of Systems: Please see the history of present illness.   All other systems are reviewed and negative.   Physical Exam: VS:  There were no vitals taken for this visit. Marland Kitchen  BMI There is no height or weight on file to calculate BMI.  Wt Readings from Last 3 Encounters:  05/02/21 169 lb 2 oz (76.7 kg)  04/21/21 163 lb (73.9 kg)  04/12/21 163 lb (73.9 kg)   GEN: Well nourished, well developed in no acute  distress HEENT: Normal NECK: No JVD; No carotid bruits LYMPHATICS: No lymphadenopathy CARDIAC:RRR, no murmurs, rubs, gallops RESPIRATORY:  Clear to auscultation without rales, wheezing or rhonchi  ABDOMEN: Soft, non-tender, non-distended MUSCULOSKELETAL:  No edema; No deformity  SKIN: Warm and dry NEUROLOGIC:  Alert and oriented x 3 PSYCHIATRIC:  Normal affect   LABORATORY DATA:  EKG:   performed today and showed NSR with no ST changes.   Lab Results  Component Value Date   WBC 6.9 08/26/2020   HGB 12.4 (L) 08/26/2020   HCT 36.8 (L) 08/26/2020   PLT 205 08/26/2020   GLUCOSE 99  04/12/2021   CHOL 146 06/02/2020   TRIG 244 (H) 06/02/2020   HDL 50 06/02/2020   LDLDIRECT 63.0 05/01/2019   LDLCALC 57 06/02/2020   ALT 33 08/23/2020   AST 31 08/23/2020   NA 140 04/12/2021   K 4.8 04/12/2021   CL 104 04/12/2021   CREATININE 1.47 (H) 04/12/2021   BUN 25 04/12/2021   CO2 22 04/12/2021   TSH 1.376 05/24/2020   PSA 1.86 04/14/2020   INR 1.0 05/24/2020   HGBA1C 5.2 05/29/2016     BNP (last 3 results) No results for input(s): BNP in the last 8760 hours.  ProBNP (last 3 results) No results for input(s): PROBNP in the last 8760 hours.   Other Studies Reviewed Today:  Cardiac cath 05/24/20  Prox LAD lesion is 90% stenosed. Ramus lesion is 50% stenosed. Mid RCA lesion is 35% stenosed. Post intervention, there is a 0% residual stenosis. A drug-eluting stent was successfully placed using a STENT RESOLUTE ONYX 3.0X22. LV end diastolic pressure is normal.   1. Single vessel occlusive CAD involving the proximal LAD 2. Normal LVEDP 3. Successful PCI of the proximal LAD with DES x 1   Plan: DAPT for one year. Risk factor modification. Will assess LV function with Echo.    Echo Impression 05/25/20   1. Left ventricular ejection fraction, by estimation, is 35 to 40%. The  left ventricle has moderately decreased function. The left ventricle has  no regional wall motion  abnormalities. Left ventricular diastolic  parameters are consistent with Grade I  diastolic dysfunction (impaired relaxation). There is mild apical  dyskinesis. There is severe hypokinesis of the entire anterior septum, but  the basal and mid segments of the anterior wall have preserved  contractility.   2. Right ventricular systolic function is normal. The right ventricular  size is normal.   3. The mitral valve is normal in structure. No evidence of mitral valve  regurgitation. No evidence of mitral stenosis.   4. The aortic valve is normal in structure. Aortic valve regurgitation is  not visualized. No aortic stenosis is present.   5. The inferior vena cava is normal in size with greater than 50%  respiratory variability, suggesting right atrial pressure of 3 mmHg.    Assessment/Plan: 1. STEMI -  05/24/20 with one vessel LAD CAD - s/p PCI  - ok to rx with just ASA at this time  - myovue no ischemia  04/21/21    2. ICM  - chronic systolic HF  -Continue prescription drug management with Entresto 24-26 mg twice daily, carvedilol 3.125 mg twice daily  -EF 39% by MRI 12/01/20 with no evidence Of myocarditis outside anterior infarcted region  -he does not appear volume overloaded on exam today - EF 42% by [facility] 04/21/21   3. HLD  - he is on crestor LDL at goal   4. Elevated LFTs  - resolved normal 06/02/20 likely related to acute MI  5. Hereditary leiomyomatosis and renal cell cancer with mets  - Seeing Dr Samul [facility] at [facility] started on Nivolumab every 2 weeks - Main cardiac side effects myocarditis [facility] surveillance colonoscopy with Dr Havery Moros history of polyps  Has had significant bone and liver mets in past and current Rx not working as well [facility]ed [facility] to see if he can get in phase one trial for new Rx Will know in a couple of months   D/c Plavix stent now 13 months out   Disposition:   FU 6 months  Time:  spent reviewing chart MRI/myovue discussing cancer Rx and [facility]  referral patient interview and composing note 60 minutes    Signed: Jenkins Rouge, MD  07/22/2021 3:21 PM  [facility] [address] [place], [place]  [address] Phone: [phone] Fax: [phone]

## 2021-07-26 DIAGNOSIS — D630 Anemia in neoplastic disease: Secondary | ICD-10-CM | POA: Diagnosis not present

## 2021-07-26 DIAGNOSIS — C7951 Secondary malignant neoplasm of bone: Secondary | ICD-10-CM | POA: Diagnosis not present

## 2021-07-26 DIAGNOSIS — M25521 Pain in right elbow: Secondary | ICD-10-CM | POA: Diagnosis not present

## 2021-07-26 DIAGNOSIS — D649 Anemia, unspecified: Secondary | ICD-10-CM | POA: Diagnosis not present

## 2021-07-26 DIAGNOSIS — D481 Neoplasm of uncertain behavior of connective and other soft tissue: Secondary | ICD-10-CM | POA: Diagnosis not present

## 2021-07-26 DIAGNOSIS — Z5112 Encounter for antineoplastic immunotherapy: Secondary | ICD-10-CM | POA: Diagnosis not present

## 2021-07-26 DIAGNOSIS — L739 Follicular disorder, unspecified: Secondary | ICD-10-CM | POA: Diagnosis not present

## 2021-07-26 DIAGNOSIS — C649 Malignant neoplasm of unspecified kidney, except renal pelvis: Secondary | ICD-10-CM | POA: Diagnosis not present

## 2021-07-26 DIAGNOSIS — M79646 Pain in unspecified finger(s): Secondary | ICD-10-CM | POA: Diagnosis not present

## 2021-07-26 DIAGNOSIS — Z5181 Encounter for therapeutic drug level monitoring: Secondary | ICD-10-CM | POA: Diagnosis not present

## 2021-07-26 DIAGNOSIS — C641 Malignant neoplasm of right kidney, except renal pelvis: Secondary | ICD-10-CM | POA: Diagnosis not present

## 2021-07-26 DIAGNOSIS — M25562 Pain in left knee: Secondary | ICD-10-CM | POA: Diagnosis not present

## 2021-07-26 DIAGNOSIS — M2550 Pain in unspecified joint: Secondary | ICD-10-CM | POA: Diagnosis not present

## 2021-07-26 DIAGNOSIS — L308 Other specified dermatitis: Secondary | ICD-10-CM | POA: Diagnosis not present

## 2021-07-26 DIAGNOSIS — Z1509 Genetic susceptibility to other malignant neoplasm: Secondary | ICD-10-CM | POA: Diagnosis not present

## 2021-07-26 DIAGNOSIS — Z79899 Other long term (current) drug therapy: Secondary | ICD-10-CM | POA: Diagnosis not present

## 2021-07-29 ENCOUNTER — Ambulatory Visit: Payer: Medicare HMO | Admitting: Cardiovascular Disease

## 2021-07-29 ENCOUNTER — Other Ambulatory Visit: Payer: Self-pay

## 2021-07-29 ENCOUNTER — Encounter: Payer: Self-pay | Admitting: Cardiovascular Disease

## 2021-07-29 VITALS — BP 110/82 | HR 79 | Ht 73.0 in | Wt 170.0 lb

## 2021-07-29 DIAGNOSIS — D481 Neoplasm of uncertain behavior of connective and other soft tissue: Secondary | ICD-10-CM

## 2021-07-29 DIAGNOSIS — E782 Mixed hyperlipidemia: Secondary | ICD-10-CM

## 2021-07-29 DIAGNOSIS — I255 Ischemic cardiomyopathy: Secondary | ICD-10-CM | POA: Diagnosis not present

## 2021-07-29 DIAGNOSIS — I2510 Atherosclerotic heart disease of native coronary artery without angina pectoris: Secondary | ICD-10-CM | POA: Diagnosis not present

## 2021-07-29 DIAGNOSIS — I10 Essential (primary) hypertension: Secondary | ICD-10-CM | POA: Diagnosis not present

## 2021-07-29 NOTE — Patient Instructions (Addendum)
Medication Instructions:  Your physician has recommended you make the following change in your medication:   1-STOP Plavix  *If you need a refill on your cardiac medications before your next appointment, please call your pharmacy*  Lab Work: If you have labs (blood work) drawn today and your tests are completely normal, you will receive your results only by: [place] (if you have MyChart) OR A paper copy in the mail If you have any lab test that is abnormal or we need to change your treatment, we will call you to review the results.  Testing/Procedures: None ordered today.  Follow-Up: At [facility], you and your health needs are our priority.  As part of our continuing mission to provide you with exceptional heart care, we have created designated Provider Care Teams.  These Care Teams include your primary Cardiologist (physician) and Advanced Practice Providers (APPs -  Physician Assistants and Nurse Practitioners) who all work together to provide you with the care you need, when you need it.  We recommend signing up for the patient portal called "MyChart".  Sign up information is provided on this After Visit Summary.  MyChart is used to connect with patients for Virtual Visits (Telemedicine).  Patients are able to view lab/test results, encounter notes, upcoming appointments, etc.  Non-urgent messages can be sent to your provider as well.   To learn more about what you can do with MyChart, go to [email or web address].    Your next appointment:   6 months  The format for your next appointment:   In Person  Provider:   Jenkins Rouge, MD {

## 2021-08-03 ENCOUNTER — Encounter: Payer: Self-pay | Admitting: Family Medicine

## 2021-08-03 DIAGNOSIS — C641 Malignant neoplasm of right kidney, except renal pelvis: Secondary | ICD-10-CM

## 2021-08-03 DIAGNOSIS — G893 Neoplasm related pain (acute) (chronic): Secondary | ICD-10-CM

## 2021-08-03 DIAGNOSIS — M19012 Primary osteoarthritis, left shoulder: Secondary | ICD-10-CM | POA: Diagnosis not present

## 2021-08-03 MED ORDER — HYDROCODONE-ACETAMINOPHEN 7.5-325 MG PO TABS: 2.0000 | ORAL_TABLET | ORAL | 0 refills | Status: DC

## 2021-08-08 NOTE — Progress Notes (Signed)
[place] [facility] [address], [place], [place] [address] [phone]  Date:  08/11/2021   Name:  Douglas [name]   DOB:  11/05/1955   MRN:  [id number]  PCP:  Darreld Mclean, MD    Chief Complaint: Medical questions (Right arm pain. )   History of Present Illness:  Douglas [name] is a 66 y.o. 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   He also uses lorazepam for anxiety [facility] for many [facility], this stopped due to changes in his disease status and he is now being treated locally  Last visit with Dr Wendee Beavers 2/7:  Assessment and Plan:  Leiomyomatosis with renal cell cancer - On systemic immunotherapy with Opdivo. He seems to be tolerating it quite well. Patient is clinically doing well today. Patient showing no signs of new immune mediated toxicity and his dermatitis is better. He completed Day 1, Cycle 21 of Opdivo on 07/12/2021. He is due Cycle 22 today. I am going to reach out to Dr. Loura Pardon for set at the [facility] to see if he will be a candidate for this neutrality going to be opening using palbociclib and [place].  Patient inquired about why surgery isn't an option for him. I explained that if we operate on him, we won't be able to treat him for 6 weeks, which would allow any tumors that weren't operated on to grow.   Encounter for antineoplastic immunotherapy apart from grade 1 immune mediated dermatitis - he has done well and he is using some Benadryl and sometimes topical steroid. He also understand that the possibilities including pneumonitis colitis hepatitis etc.  Bone mets - on Xgeva. He was on hold because patient has had a dental abscess that required  surgery. He was finishing up antibiotics. Delton See was restarted on 07/12/21.  Anemia - Mild, stable, asymptomatic.   Balance Problems-stable  Joint pain - Starting 3-4 weeks ago, he started having severe joint pain every morning, especially in his right elbow, his knee, and his fingers. His joints are not swollen or red and the pain improves during the day. I will refer him to orthopedics because it seems likely to be osteoarthritic pain.   He takes 2 tablets of Vicodin every 3-4 hours, which has helped his pain. I recommend he take pain medication instead of Lorazepam before bed to help prevent severe pain in the morning. Patient is hesitant about long acting pain medications because he does not tolerate narcotics well.    He notes that on 1/7 he started having more joint pains esp in his hands, knees It was sneaking up on him during the night  The pain was so severe as to bring him to tears  He takes his last pain dose around 7:45 pm and then uses lorazepam for sleep His oncologist suggested taking his pain med instead of the lorazepam at bedtime-he is not sure what he should do He also takes trazodone at bedtime - 25 mg only   He also notes worsening of his low energy and depression symptoms.  He has been dealing with cancer treatment constantly for about 6 years now.  Part of him wants to give up, but he wants to keep  trying for his wife.  He is taking Cymbalta 20 mg once daily, we discussed increasing the dose and he would like to give this a try  He saw Dr Mardelle Matte who agreed he had some joint swelling-however, we otherwise have not identified any specific disease process besides osteoarthritis He fills his hydrocodone approximately every 26 to 28 days.  It is technically however a 20-day supply.  Douglas [name] feels quite stressed about asking for his refills, he feels like the pharmacy thinks he is "a drug addict" He often feels that an extra half or 1 tablet daily might be helpful in controlling his  pain He does not particularly want to switch over to MS Contin or oxycodone  He is thinking of starting a new trial at [place]- this is a big decision for him  He and his wife are thinking this over carefully  Patient Active Problem List   Diagnosis Date Noted   NSTEMI (non-ST elevated myocardial infarction) ([place]) 05/24/2020   Acute ST elevation myocardial infarction (STEMI) ([place])    Bone metastases ([place]) 07/26/2016   CAD (coronary artery disease) 03/25/2014   Protein-calorie malnutrition, severe ([place]) 10/17/2013   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) 08/20/2012   Renal calculi    GERD (gastroesophageal reflux disease)    Allergic rhinitis    Osteoarthritis    Encounter for long-term (current) use of other medications    Hyperlipidemia    Tobacco use disorder    COPD (chronic obstructive pulmonary disease) ([place])    Unspecified vitamin D deficiency    Hypertension     Past Medical History:  Diagnosis Date   Allergic rhinitis    Anxiety    Arthritis    "all over my body" (10/16/2013)   Cataract    Chronic back pain    "neck to lower back" (10/16/2013)   Colon polyps    Compression fracture    S/P L-SPINE; pt does not recall this hx on 10/16/2013   COPD (chronic obstructive pulmonary disease) (HCC)    FVC .66   Coronary artery disease    Depression    "situational since 09/22/2013 OR"   Encounter for long-term (current) use of other medications    GERD (gastroesophageal reflux disease)    Hemoptysis    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)    History of blood transfusion 09/2013   "think so; not sure; related to big OR"   History of surgical fusion joint    DDD C-SPINE   Hx of blood clots    Hyperlipidemia    Hypertension    Hypertensive retinopathy    Leiomyoma OF SKIN   INCREASED RISK RENAL CELL CA  NEEDS CT OF KIDNEYS EVERY 2 YEARS NEXT DUE  02/06/13   Melanoma in situ ([place])    Melanoma of back ([place])    Neuromuscular disorder  ([place])    Osteoarthritis    Renal calculi    Renal cell carcinoma ([place])    type 2; papillary (metastized to bone)   Tobacco use disorder    Unspecified vitamin D deficiency     Past Surgical History:  Procedure Laterality Date   ANTERIOR CERVICAL DECOMP/DISCECTOMY FUSION  1992   ANTERIOR CRUCIATE LIGAMENT REPAIR Bilateral [phone]   APPENDECTOMY  09/22/2013   CARDIAC CATHETERIZATION     CHOLECYSTECTOMY  09/22/2013   "had tumors in it"   CORONARY STENT INTERVENTION N/A 05/24/2020   Procedure: CORONARY STENT INTERVENTION;  Surgeon: Martinique, Peter M, MD;  Location: [facility];  Service: Cardiovascular;  Laterality: N/A;   HAND LIGAMENT RECONSTRUCTION Right    HARVEST BONE GRAFT Right 1992   "hip; for neck fusion"   INGUINAL HERNIA REPAIR Right 2004   KNEE SURGERY Right 1974   KNEE SURGERY Left 1975   LEFT HEART CATH AND CORONARY ANGIOGRAPHY N/A 05/24/2020   Procedure: LEFT HEART CATH AND CORONARY ANGIOGRAPHY;  Surgeon: Martinique, Peter M, MD;  Location: [facility];  Service: Cardiovascular;  Laterality: N/A;   LIGAMENT REPAIR Right ~ 2001   "little finger"   LYMPH NODE DISSECTION Right 09/22/2013   "in the area of the kidney; they were clean"   MELANOMA EXCISION  ~ 2007   "off my back"   PARTIAL NEPHRECTOMY Right 09/22/2013   removal of fascia overlying the right psoas Right 09/22/2013   RENAL ARTERY STENT Right 09/22/2013   RIGHT COLECTOMY Right 09/22/2013   ROBOTIC ASSITED PARTIAL NEPHRECTOMY  05/22/2012   Procedure: ROBOTIC ASSITED PARTIAL NEPHRECTOMY;  Surgeon: Alexis Frock, MD;  Location: [facility];  Service: Urology;  Laterality: Right;  Right Robotic Cyst Decortication and Partial Nephrectomy    SPINE SURGERY      Social History   Tobacco Use   Smoking status: Former    Packs/day: 0.50    Years: 40.00    Pack years: 20.00    Types: Cigarettes   Smokeless tobacco: Never  Vaping Use   Vaping Use: Never used  Substance Use Topics    Alcohol use: Not Currently   Drug use: Yes    Types: Marijuana    Family History  Adopted: Yes  Problem Relation Age of Onset   Blindness Mother    Glaucoma Mother    Hypertension Father     Allergies  Allergen Reactions   Avelox [Moxifloxacin Hcl In Nacl] Hives, Shortness Of Breath and Swelling   Tape Dermatitis and Rash    Just the glue    Medication list has been reviewed and updated.  Current Outpatient Medications on File Prior to Visit  Medication Sig Dispense Refill   aspirin EC 81 MG tablet Take 1 tablet (81 mg total) by mouth daily. Swallow whole.     B Complex Vitamins (B COMPLEX 100 PO) Take 100 mg by mouth daily.     Calcium Carb-Cholecalciferol (CALCIUM 600/VITAMIN D PO) Take 1 capsule by mouth daily.     calcium carbonate (TUMS - DOSED IN MG ELEMENTAL CALCIUM) 500 MG chewable tablet Chew 500 mg by mouth 3 (three) times daily as needed for indigestion or heartburn.     carvedilol (COREG) 3.125 MG tablet TAKE 1 TABLET (3.125 MG TOTAL) BY MOUTH 2 (TWO) TIMES DAILY WITH A MEAL. 180 tablet 2   cetirizine (ZYRTEC) 10 MG tablet Take 10 mg by mouth daily.     cholecalciferol (VITAMIN D3) 25 MCG (1000 UNIT) tablet Take 2,000 Units by mouth daily.     denosumab (XGEVA) 120 MG/1.7ML SOLN injection Inject 120 mg into the skin every 30 (thirty) days.      HYDROcodone-acetaminophen (NORCO) 7.5-325 MG tablet Take 2 tablets by mouth See admin instructions. Take 2 every 4 hours through out the day until taking ativan at bedtime, do not mix 240 tablet 0   loperamide (IMODIUM A-D) 2 MG tablet Take 2 mg by mouth 4 (four) times daily as needed for diarrhea or loose stools.     LORazepam (ATIVAN) 1 MG tablet Take 0.5-1 tablets (0.5-1 mg total) by mouth at bedtime  as needed for sleep. TAKE ONE HALF OR ONE TABLET BY MOUTH AT BEDTIME IF NEEDED FOR SLEEP, DO NOT TAKE WITH PAIN MEDS 30 tablet 3   Multiple Vitamins-Minerals (CENTRUM SILVER 50+MEN PO) Take 1 tablet by mouth daily.      nitroGLYCERIN (NITROSTAT) 0.4 MG SL tablet Place 1 tablet (0.4 mg total) under the tongue every 5 (five) minutes x 3 doses as needed for chest pain. Please dispense #25 x 4 bottle 100 tablet 1   Nivolumab (OPDIVO IV) Inject into the vein every 14 (fourteen) days.     ondansetron (ZOFRAN-ODT) 4 MG disintegrating tablet TAKE 1 TABLET BY MOUTH SUBLINGUALLY EVERY 8 HOURS AS NEEDED FOR NAUSEA OR VOMITING 40 tablet 2   pantoprazole (PROTONIX) 20 MG tablet Take 1 tablet (20 mg total) by mouth daily. 90 tablet 3   pseudoephedrine (SUDAFED) 30 MG tablet Take 30 mg by mouth once.     rosuvastatin (CRESTOR) 10 MG tablet TAKE 1 TABLET BY MOUTH EVERY DAY 90 tablet 2   sacubitril-valsartan (ENTRESTO) 24-26 MG Take 1 tablet by mouth 2 (two) times daily. 60 tablet 7   vitamin C (ASCORBIC ACID) 500 MG tablet Take 500 mg by mouth daily.     No current facility-administered medications on file prior to visit.    Review of Systems:  As per HPI- otherwise negative.   Physical Examination: Vitals:   08/11/21 1127  BP: 132/80  Pulse: 72  Resp: 18  Temp: 98.2 F (36.8 C)  SpO2: 96%   Vitals:   08/11/21 1127  Weight: 168 lb 6.4 oz (76.4 kg)   Body mass index is 22.22 kg/m. Ideal Body Weight:    GEN: no acute distress. Chronically ill w/cancer but looks his normal self  HEENT: Atraumatic, Normocephalic.  Ears and Nose: No external deformity. CV: RRR, No M/G/R. No JVD. No thrill. No extra heart sounds. PULM: CTA B, no wheezes, crackles, rhonchi. No retractions. No resp. distress. No accessory muscle use. ABD: S, NT, ND, +BS. No rebound. No HSM. EXTR: No c/c/e PSYCH: Normally interactive. Conversant.  Some thickening of the hand joints is present consistent with osteoarthritis  Assessment and Plan: Renal cell cancer, right (HCC)  Chronic pain due to neoplasm - Plan: DULoxetine (CYMBALTA) 20 MG capsule  Reactive depression - Plan: DULoxetine (CYMBALTA) 20 MG capsule  Essential  hypertension  Primary insomnia  Douglas [name] is seen today for follow-up.  We decided against any blood work today Blood pressure under good control Discussed his depression, likely due to to stressors including prolonged cancer treatment and his wife's health problems.  We will try increasing Cymbalta to twice a day, he will let me know if this is tolerated well.  If so we can plan an additional increase  We also discussed his hydrocodone for chronic pain due to metastatic cancer He is struggling with quite a bit of pain, but feels ashamed about asking for early refills We decided to increase his hydrocodone allotment to #260; he will remind him when due for refill His UDS is up-to-date, done in September Signed Lamar Blinks, MD

## 2021-08-09 DIAGNOSIS — Z5112 Encounter for antineoplastic immunotherapy: Secondary | ICD-10-CM | POA: Diagnosis not present

## 2021-08-09 DIAGNOSIS — C7951 Secondary malignant neoplasm of bone: Secondary | ICD-10-CM | POA: Diagnosis not present

## 2021-08-09 DIAGNOSIS — C641 Malignant neoplasm of right kidney, except renal pelvis: Secondary | ICD-10-CM | POA: Diagnosis not present

## 2021-08-11 ENCOUNTER — Ambulatory Visit (INDEPENDENT_AMBULATORY_CARE_PROVIDER_SITE_OTHER): Payer: Medicare HMO | Admitting: Family Medicine

## 2021-08-11 VITALS — BP 132/80 | HR 72 | Temp 98.2°F | Resp 18 | Wt 168.4 lb

## 2021-08-11 DIAGNOSIS — F329 Major depressive disorder, single episode, unspecified: Secondary | ICD-10-CM

## 2021-08-11 DIAGNOSIS — C641 Malignant neoplasm of right kidney, except renal pelvis: Secondary | ICD-10-CM

## 2021-08-11 DIAGNOSIS — F5101 Primary insomnia: Secondary | ICD-10-CM

## 2021-08-11 DIAGNOSIS — G893 Neoplasm related pain (acute) (chronic): Secondary | ICD-10-CM | POA: Diagnosis not present

## 2021-08-11 DIAGNOSIS — R69 Illness, unspecified: Secondary | ICD-10-CM | POA: Diagnosis not present

## 2021-08-11 DIAGNOSIS — I10 Essential (primary) hypertension: Secondary | ICD-10-CM | POA: Diagnosis not present

## 2021-08-11 MED ORDER — DULOXETINE HCL 20 MG PO CPEP: 20.0000 mg | ORAL_CAPSULE | Freq: Two times a day (BID) | ORAL | 3 refills | Status: DC

## 2021-08-11 NOTE — Patient Instructions (Signed)
It was great to see you again today- I am sorry you are having such a hard time We can increase your hydrocodone rx to #260 at your next fill, please remind me Ok to take the cymbalta 20 twice a day- this may help w depression as well  We can try going up on your dosage more, please let me know how you respond to this first increase

## 2021-08-23 DIAGNOSIS — D481 Neoplasm of uncertain behavior of connective and other soft tissue: Secondary | ICD-10-CM | POA: Diagnosis not present

## 2021-08-23 DIAGNOSIS — M2550 Pain in unspecified joint: Secondary | ICD-10-CM | POA: Diagnosis not present

## 2021-08-23 DIAGNOSIS — C7951 Secondary malignant neoplasm of bone: Secondary | ICD-10-CM | POA: Diagnosis not present

## 2021-08-23 DIAGNOSIS — C641 Malignant neoplasm of right kidney, except renal pelvis: Secondary | ICD-10-CM | POA: Diagnosis not present

## 2021-08-23 DIAGNOSIS — M25562 Pain in left knee: Secondary | ICD-10-CM | POA: Diagnosis not present

## 2021-08-23 DIAGNOSIS — D649 Anemia, unspecified: Secondary | ICD-10-CM | POA: Diagnosis not present

## 2021-08-23 DIAGNOSIS — L739 Follicular disorder, unspecified: Secondary | ICD-10-CM | POA: Diagnosis not present

## 2021-08-23 DIAGNOSIS — M25521 Pain in right elbow: Secondary | ICD-10-CM | POA: Diagnosis not present

## 2021-08-23 DIAGNOSIS — Z5112 Encounter for antineoplastic immunotherapy: Secondary | ICD-10-CM | POA: Diagnosis not present

## 2021-08-23 DIAGNOSIS — Z1509 Genetic susceptibility to other malignant neoplasm: Secondary | ICD-10-CM | POA: Diagnosis not present

## 2021-08-23 DIAGNOSIS — Z79899 Other long term (current) drug therapy: Secondary | ICD-10-CM | POA: Diagnosis not present

## 2021-08-23 DIAGNOSIS — M79646 Pain in unspecified finger(s): Secondary | ICD-10-CM | POA: Diagnosis not present

## 2021-08-23 DIAGNOSIS — D630 Anemia in neoplastic disease: Secondary | ICD-10-CM | POA: Diagnosis not present

## 2021-08-23 DIAGNOSIS — L662 Folliculitis decalvans: Secondary | ICD-10-CM | POA: Diagnosis not present

## 2021-08-23 DIAGNOSIS — R269 Unspecified abnormalities of gait and mobility: Secondary | ICD-10-CM | POA: Diagnosis not present

## 2021-08-23 DIAGNOSIS — C220 Liver cell carcinoma: Secondary | ICD-10-CM | POA: Diagnosis not present

## 2021-08-30 ENCOUNTER — Other Ambulatory Visit: Payer: Self-pay | Admitting: Cardiology

## 2021-08-31 ENCOUNTER — Encounter: Payer: Self-pay | Admitting: Family Medicine

## 2021-09-01 MED ORDER — HYDROCODONE-ACETAMINOPHEN 7.5-325 MG PO TABS: 2.0000 | ORAL_TABLET | ORAL | 0 refills | Status: DC

## 2021-09-06 DIAGNOSIS — C7951 Secondary malignant neoplasm of bone: Secondary | ICD-10-CM | POA: Diagnosis not present

## 2021-09-06 DIAGNOSIS — Z5111 Encounter for antineoplastic chemotherapy: Secondary | ICD-10-CM | POA: Diagnosis not present

## 2021-09-06 DIAGNOSIS — C641 Malignant neoplasm of right kidney, except renal pelvis: Secondary | ICD-10-CM | POA: Diagnosis not present

## 2021-09-12 ENCOUNTER — Encounter: Payer: Self-pay | Admitting: Family Medicine

## 2021-09-12 DIAGNOSIS — R8290 Unspecified abnormal findings in urine: Secondary | ICD-10-CM

## 2021-09-13 ENCOUNTER — Encounter: Payer: Self-pay | Admitting: Cardiovascular Disease

## 2021-09-13 ENCOUNTER — Other Ambulatory Visit (INDEPENDENT_AMBULATORY_CARE_PROVIDER_SITE_OTHER): Payer: Medicare HMO

## 2021-09-13 DIAGNOSIS — I420 Dilated cardiomyopathy: Secondary | ICD-10-CM

## 2021-09-13 DIAGNOSIS — R8290 Unspecified abnormal findings in urine: Secondary | ICD-10-CM

## 2021-09-13 LAB — URINALYSIS, MICROSCOPIC ONLY
RBC / HPF: NONE SEEN (ref 0–?)
WBC, UA: NONE SEEN (ref 0–?)

## 2021-09-14 LAB — URINE CULTURE
MICRO NUMBER:: 13189898
Result:: NO GROWTH
SPECIMEN QUALITY:: ADEQUATE

## 2021-09-15 ENCOUNTER — Encounter: Payer: Self-pay | Admitting: Family Medicine

## 2021-09-15 DIAGNOSIS — C649 Malignant neoplasm of unspecified kidney, except renal pelvis: Secondary | ICD-10-CM | POA: Diagnosis not present

## 2021-09-15 DIAGNOSIS — Z5112 Encounter for antineoplastic immunotherapy: Secondary | ICD-10-CM | POA: Diagnosis not present

## 2021-09-15 DIAGNOSIS — Z905 Acquired absence of kidney: Secondary | ICD-10-CM | POA: Diagnosis not present

## 2021-09-15 DIAGNOSIS — Z1509 Genetic susceptibility to other malignant neoplasm: Secondary | ICD-10-CM | POA: Diagnosis not present

## 2021-09-15 DIAGNOSIS — R918 Other nonspecific abnormal finding of lung field: Secondary | ICD-10-CM | POA: Diagnosis not present

## 2021-09-15 DIAGNOSIS — C787 Secondary malignant neoplasm of liver and intrahepatic bile duct: Secondary | ICD-10-CM | POA: Diagnosis not present

## 2021-09-15 DIAGNOSIS — C641 Malignant neoplasm of right kidney, except renal pelvis: Secondary | ICD-10-CM | POA: Diagnosis not present

## 2021-09-15 DIAGNOSIS — K7689 Other specified diseases of liver: Secondary | ICD-10-CM | POA: Diagnosis not present

## 2021-09-15 DIAGNOSIS — R911 Solitary pulmonary nodule: Secondary | ICD-10-CM | POA: Diagnosis not present

## 2021-09-15 NOTE — Telephone Encounter (Signed)
Per [name], Can order MRI for DCM his EF was stable on nuclear study November. ?

## 2021-09-20 ENCOUNTER — Encounter: Payer: Self-pay | Admitting: Family Medicine

## 2021-09-20 ENCOUNTER — Encounter: Payer: Self-pay | Admitting: Cardiovascular Disease

## 2021-09-20 DIAGNOSIS — M25561 Pain in right knee: Secondary | ICD-10-CM | POA: Diagnosis not present

## 2021-09-20 DIAGNOSIS — R002 Palpitations: Secondary | ICD-10-CM | POA: Diagnosis not present

## 2021-09-20 DIAGNOSIS — M25521 Pain in right elbow: Secondary | ICD-10-CM | POA: Diagnosis not present

## 2021-09-20 DIAGNOSIS — C641 Malignant neoplasm of right kidney, except renal pelvis: Secondary | ICD-10-CM | POA: Diagnosis not present

## 2021-09-20 DIAGNOSIS — L308 Other specified dermatitis: Secondary | ICD-10-CM | POA: Diagnosis not present

## 2021-09-20 DIAGNOSIS — M2550 Pain in unspecified joint: Secondary | ICD-10-CM | POA: Diagnosis not present

## 2021-09-20 DIAGNOSIS — R269 Unspecified abnormalities of gait and mobility: Secondary | ICD-10-CM | POA: Diagnosis not present

## 2021-09-20 DIAGNOSIS — C649 Malignant neoplasm of unspecified kidney, except renal pelvis: Secondary | ICD-10-CM | POA: Diagnosis not present

## 2021-09-20 DIAGNOSIS — R69 Illness, unspecified: Secondary | ICD-10-CM | POA: Diagnosis not present

## 2021-09-20 DIAGNOSIS — K219 Gastro-esophageal reflux disease without esophagitis: Secondary | ICD-10-CM | POA: Diagnosis not present

## 2021-09-20 DIAGNOSIS — C7951 Secondary malignant neoplasm of bone: Secondary | ICD-10-CM | POA: Diagnosis not present

## 2021-09-20 DIAGNOSIS — L738 Other specified follicular disorders: Secondary | ICD-10-CM | POA: Diagnosis not present

## 2021-09-20 DIAGNOSIS — D649 Anemia, unspecified: Secondary | ICD-10-CM | POA: Diagnosis not present

## 2021-09-20 DIAGNOSIS — L664 Folliculitis ulerythematosa reticulata: Secondary | ICD-10-CM | POA: Diagnosis not present

## 2021-09-20 DIAGNOSIS — M25541 Pain in joints of right hand: Secondary | ICD-10-CM | POA: Diagnosis not present

## 2021-09-20 DIAGNOSIS — Z1509 Genetic susceptibility to other malignant neoplasm: Secondary | ICD-10-CM | POA: Diagnosis not present

## 2021-09-20 MED ORDER — NALOXONE HCL 4 MG/0.1ML NA LIQD: NASAL | 99 refills | Status: DC

## 2021-09-20 NOTE — Telephone Encounter (Signed)
Oncology would like to add a low dose fentanyl patch- certainly ok with me ?Rx for narcan also sent  ?

## 2021-09-28 ENCOUNTER — Encounter: Payer: Self-pay | Admitting: Family Medicine

## 2021-09-28 DIAGNOSIS — C641 Malignant neoplasm of right kidney, except renal pelvis: Secondary | ICD-10-CM

## 2021-09-28 DIAGNOSIS — G893 Neoplasm related pain (acute) (chronic): Secondary | ICD-10-CM

## 2021-09-28 DIAGNOSIS — N1831 Chronic kidney disease, stage 3a: Secondary | ICD-10-CM | POA: Diagnosis not present

## 2021-09-29 DIAGNOSIS — C7951 Secondary malignant neoplasm of bone: Secondary | ICD-10-CM | POA: Diagnosis not present

## 2021-09-29 DIAGNOSIS — M2550 Pain in unspecified joint: Secondary | ICD-10-CM | POA: Diagnosis not present

## 2021-09-29 DIAGNOSIS — G893 Neoplasm related pain (acute) (chronic): Secondary | ICD-10-CM | POA: Diagnosis not present

## 2021-09-29 DIAGNOSIS — Z1509 Genetic susceptibility to other malignant neoplasm: Secondary | ICD-10-CM | POA: Diagnosis not present

## 2021-09-29 DIAGNOSIS — C649 Malignant neoplasm of unspecified kidney, except renal pelvis: Secondary | ICD-10-CM | POA: Diagnosis not present

## 2021-09-29 DIAGNOSIS — D481 Neoplasm of uncertain behavior of connective and other soft tissue: Secondary | ICD-10-CM | POA: Diagnosis not present

## 2021-09-29 DIAGNOSIS — R002 Palpitations: Secondary | ICD-10-CM | POA: Diagnosis not present

## 2021-09-29 DIAGNOSIS — L739 Follicular disorder, unspecified: Secondary | ICD-10-CM | POA: Diagnosis not present

## 2021-09-29 MED ORDER — HYDROCODONE-ACETAMINOPHEN 7.5-325 MG PO TABS: 2.0000 | ORAL_TABLET | ORAL | 0 refills | Status: DC

## 2021-10-06 DIAGNOSIS — I129 Hypertensive chronic kidney disease with stage 1 through stage 4 chronic kidney disease, or unspecified chronic kidney disease: Secondary | ICD-10-CM | POA: Diagnosis not present

## 2021-10-06 DIAGNOSIS — Z1509 Genetic susceptibility to other malignant neoplasm: Secondary | ICD-10-CM | POA: Diagnosis not present

## 2021-10-06 DIAGNOSIS — E872 Acidosis, unspecified: Secondary | ICD-10-CM | POA: Diagnosis not present

## 2021-10-06 DIAGNOSIS — N1831 Chronic kidney disease, stage 3a: Secondary | ICD-10-CM | POA: Diagnosis not present

## 2021-10-06 DIAGNOSIS — I2510 Atherosclerotic heart disease of native coronary artery without angina pectoris: Secondary | ICD-10-CM | POA: Diagnosis not present

## 2021-10-06 DIAGNOSIS — R69 Illness, unspecified: Secondary | ICD-10-CM | POA: Diagnosis not present

## 2021-10-06 DIAGNOSIS — R809 Proteinuria, unspecified: Secondary | ICD-10-CM | POA: Diagnosis not present

## 2021-10-07 ENCOUNTER — Ambulatory Visit (HOSPITAL_COMMUNITY)
Admission: RE | Admit: 2021-10-07 | Discharge: 2021-10-07 | Disposition: A | Discharge status: Home / Self Care | Payer: Medicare HMO | Source: Ambulatory Visit | Attending: Cardiovascular Disease | Admitting: Cardiovascular Disease

## 2021-10-07 ENCOUNTER — Ambulatory Visit (HOSPITAL_COMMUNITY): Admission: RE | Admit: 2021-10-07 | Payer: Medicare HMO | Source: Ambulatory Visit

## 2021-10-07 ENCOUNTER — Other Ambulatory Visit (HOSPITAL_COMMUNITY): Payer: Medicare HMO

## 2021-10-07 DIAGNOSIS — I420 Dilated cardiomyopathy: Secondary | ICD-10-CM | POA: Diagnosis not present

## 2021-10-07 IMAGING — MR MR CARD MORPHOLOGY WO/W CM
45 of 48 series · 45 of 48 positions shown · IV contrast (Contrast agent)
Comparison: [DATE]

CLINICAL DATA: Ischemic Cardiomyopathy

EXAM:
CARDIAC MRI
TECHNIQUE: The patient was scanned on a 1.5 Tesla Siemens magnet. A dedicated
cardiac coil was used. Functional imaging was done using Fiesta
sequences. [DATE], and 4 chamber views were done to assess for RWMA's.
Modified FEFE MEJAA rule using a short axis stack was used to
calculate an ejection fraction on a dedicated work station using
Circle software. The patient received 10 cc of Gadavist. After 10
minutes inversion recovery sequences were used to assess for
infiltration and scar tissue.
CONTRAST:  Gadavist

[Series 4: [id number]_bh · axial · 8.0mm · 1.41mm/px · 1 of 16 slices shown]
[im 1/16]
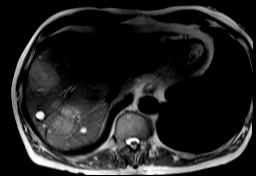

[Series 8: bSSFP · oblique · 8.0mm · 1.61mm/px · 1 of 25 slices shown (1 of 24)]
[im 1/25]
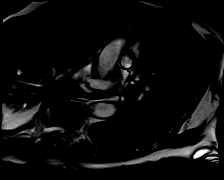

[Series 9: bSSFP · oblique · 8.0mm · 1.61mm/px · 1 of 25 slices shown (2 of 24)]
[im 1/25]
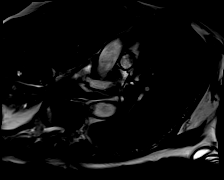

[Series 10: bSSFP · oblique · 8.0mm · 1.61mm/px · 1 of 25 slices shown (3 of 24)]
[im 1/25]
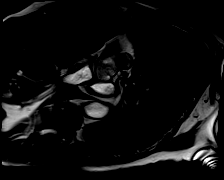

[Series 11: bSSFP · oblique · 8.0mm · 1.61mm/px · 1 of 25 slices shown (4 of 24)]
[im 1/25]
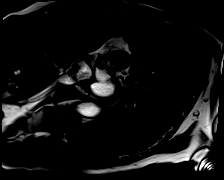

[Series 12: bSSFP · oblique · 8.0mm · 1.61mm/px · 1 of 25 slices shown (5 of 24)]
[im 1/25]
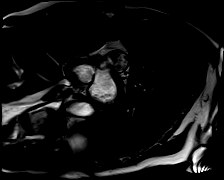

[Series 13: bSSFP · oblique · 8.0mm · 1.61mm/px · 1 of 25 slices shown (6 of 24)]
[im 1/25]
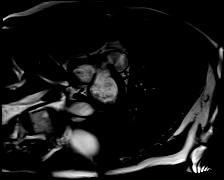

[Series 14: bSSFP · oblique · 8.0mm · 1.61mm/px · 1 of 25 slices shown (7 of 24)]
[im 1/25]
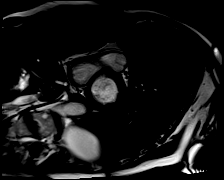

[Series 15: bSSFP · oblique · 8.0mm · 1.61mm/px · 1 of 25 slices shown (8 of 24)]
[im 1/25]
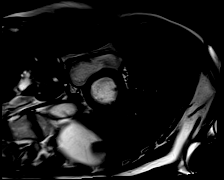

[Series 16: bSSFP · oblique · 8.0mm · 1.61mm/px · 1 of 25 slices shown (9 of 24)]
[im 1/25]
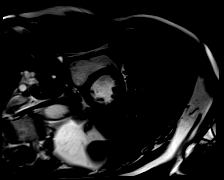

[Series 17: bSSFP · oblique · 8.0mm · 1.61mm/px · 1 of 25 slices shown (10 of 24)]
[im 1/25]
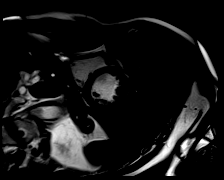

[Series 18: bSSFP · oblique · 8.0mm · 1.61mm/px · 1 of 25 slices shown (11 of 24)]
[im 1/25]
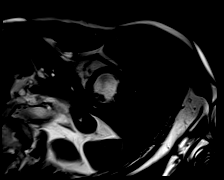

[Series 19: bSSFP · oblique · 8.0mm · 1.61mm/px · 1 of 25 slices shown (12 of 24)]
[im 1/25]
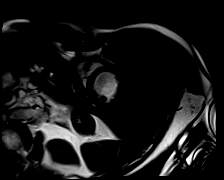

[Series 20: bSSFP · oblique · 8.0mm · 1.61mm/px · 1 of 25 slices shown (13 of 24)]
[im 1/25]
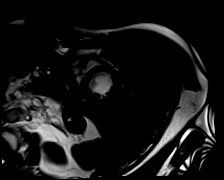

[Series 21: bSSFP · oblique · 8.0mm · 1.61mm/px · 1 of 25 slices shown (14 of 24)]
[im 1/25]
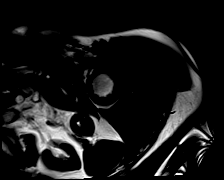

[Series 22: bSSFP · oblique · 8.0mm · 1.61mm/px · 1 of 25 slices shown (15 of 24)]
[im 1/25]
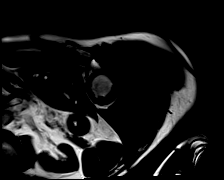

[Series 23: bSSFP · oblique · 8.0mm · 1.61mm/px · 1 of 25 slices shown (16 of 24)]
[im 1/25]
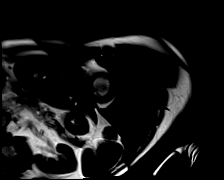

[Series 24: bSSFP · oblique · 8.0mm · 1.61mm/px · 1 of 25 slices shown (17 of 24)]
[im 1/25]
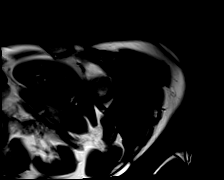

[Series 25: bSSFP · oblique · 8.0mm · 1.61mm/px · 1 of 25 slices shown (18 of 24)]
[im 1/25]
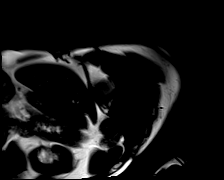

[Series 26: bSSFP · oblique · 8.0mm · 1.61mm/px · 1 of 25 slices shown (19 of 24)]
[im 1/25]
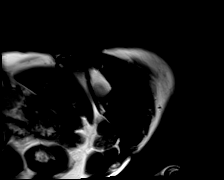

[Series 27: bSSFP · oblique · 8.0mm · 1.61mm/px · 1 of 25 slices shown (20 of 24)]
[im 1/25]
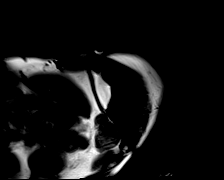

[Series 28: bSSFP · oblique · 8.0mm · 1.61mm/px · 1 of 25 slices shown (21 of 24)]
[im 1/25]
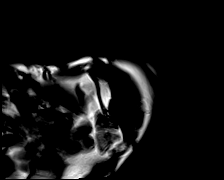

[Series 29: bSSFP · oblique · 6.0mm · 1.41mm/px · 1 of 25 slices shown (22 of 24)]
[im 1/25]
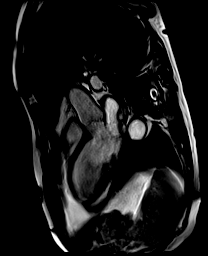

[Series 30: bSSFP · sagittal · 6.0mm · 1.41mm/px · 1 of 25 slices shown (23 of 24)]
[im 1/25]
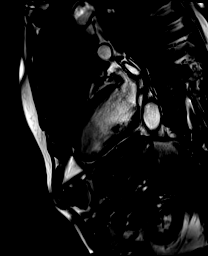

[Series 31: bSSFP · axial · 6.0mm · 1.41mm/px · 1 of 25 slices shown (24 of 24)]
[im 1/25]
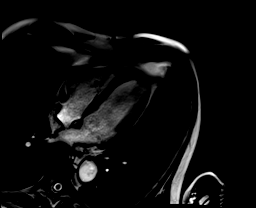

[Series 32: (id)_long_t1 · oblique · 8.0mm · 1.56mm/px · 1 of 24 slices shown]
[im 1/24]
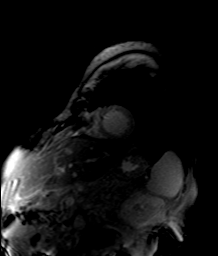

[Series 33: (id)_long_t1_moco · oblique · 8.0mm · 1.56mm/px · 1 of 24 slices shown]
[im 1/24]
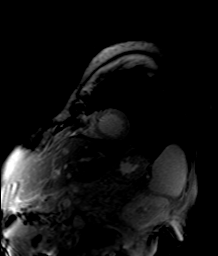

[Series 34: (id)_long_t1_moco_t1 · oblique · 8.0mm · 1.56mm/px · 1 of 6 slices shown]
[im 1/6]
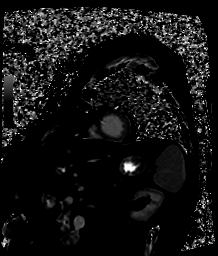

[Series 36: (id)_trufi · oblique · 8.0mm · 2.08mm/px · 1 of 9 slices shown]
[im 1/9]
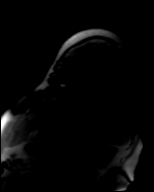

[Series 37: (id)_trufi_moco · oblique · 8.0mm · 2.08mm/px · 1 of 9 slices shown]
[im 1/9]
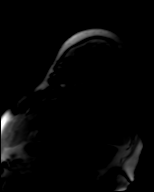

[Series 40: pre short axis · oblique · non-contrast · 8.0mm · 2.25mm/px · 1 of 10 slices shown (1 of 6)]
[im 1/10]
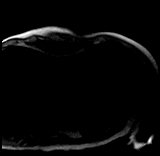

[Series 41: pre short axis · oblique · non-contrast · 8.0mm · 2.25mm/px · 1 of 10 slices shown (2 of 6)]
[im 1/10]
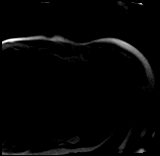

[Series 42: pre short axis · oblique · non-contrast · 8.0mm · 2.25mm/px · 1 of 9 slices shown (3 of 6)]
[im 1/9]
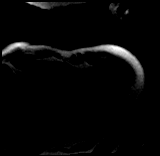

[Series 43: pre short axis · oblique · non-contrast · 8.0mm · 2.25mm/px · 1 of 10 slices shown (4 of 6)]
[im 1/10]
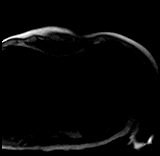

[Series 44: pre short axis · oblique · non-contrast · 8.0mm · 2.25mm/px · 1 of 10 slices shown (5 of 6)]
[im 1/10]
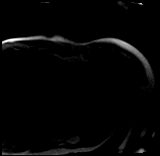

[Series 45: pre short axis · oblique · non-contrast · 8.0mm · 2.25mm/px · 1 of 9 slices shown (6 of 6)]
[im 1/9]
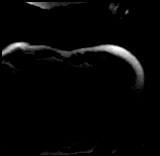

[Series 46: cine_trufi_short axis_cs_2_shot · oblique · 8.0mm · 1.48mm/px · 1 of 25 slices shown (1 of 9)]
[im 1/25]
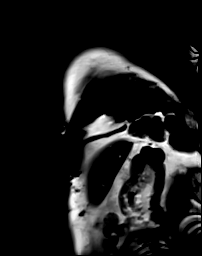

[Series 46: cine_trufi_short axis_cs_2_shot · oblique · 8.0mm · 1.48mm/px · 1 of 25 slices shown (2 of 9)]
[im 1/25]
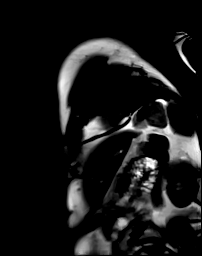

[Series 46: cine_trufi_short axis_cs_2_shot · oblique · 8.0mm · 1.48mm/px · 1 of 25 slices shown (3 of 9)]
[im 1/25]
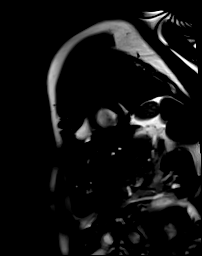

[Series 46: cine_trufi_short axis_cs_2_shot · oblique · 8.0mm · 1.48mm/px · 1 of 25 slices shown (4 of 9)]
[im 1/25]
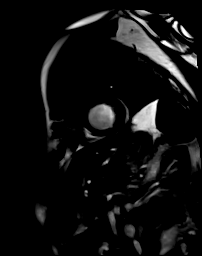

[Series 46: cine_trufi_short axis_cs_2_shot · oblique · 8.0mm · 1.48mm/px · 1 of 25 slices shown (5 of 9)]
[im 1/25]
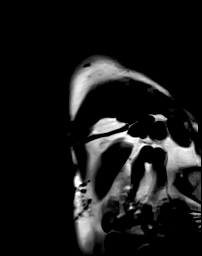

[Series 46: cine_trufi_short axis_cs_2_shot · oblique · 8.0mm · 1.48mm/px · 1 of 25 slices shown (6 of 9)]
[im 1/25]
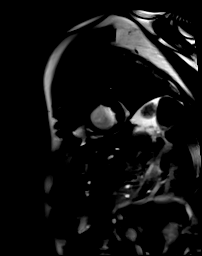

[Series 46: cine_trufi_short axis_cs_2_shot · oblique · 8.0mm · 1.48mm/px · 1 of 25 slices shown (7 of 9)]
[im 1/25]
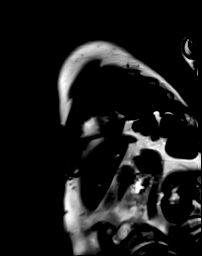

[Series 46: cine_trufi_short axis_cs_2_shot · oblique · 8.0mm · 1.48mm/px · 1 of 25 slices shown (8 of 9)]
[im 1/25]
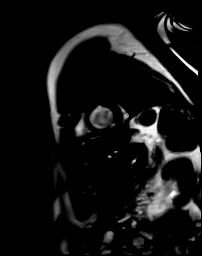

[Series 46: cine_trufi_short axis_cs_2_shot · oblique · 8.0mm · 1.48mm/px · 1 of 25 slices shown (9 of 9)]
[im 1/25]
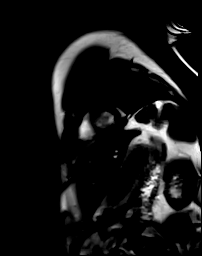

[45 of 48 positions shown; findings below may reference images not displayed]

FINDINGS: Normal atrial size. No PFO/ASD. Normal ascending thoracic aorta
cm. No pericardial effusion Prominent epicardial adipose tissue.
Normal RV size and function

Mild LVE with RWMA consistent with previous anterior wall MI.
Mid/distal anterior wall, septal, apical and inferior apical wall
hypokinesis. The Distal anterior wall and

Apex are thinned. No mural apical thrombus Quantitative EF 38% (EDV
153 cc ESV 96 cc SV 58 cc )

Delayed Gadolinium images showed full thickness scar in the distal
anterior wall, septum and apex with subendocardial scar in the mid
anterior wall, and inferior apex

Findings similar to MRI done [DATE]
IMPRESSION: 1. Mild LVE with RWMA;s consistent with prior LAD infarct. LvEF 38%
Similar to EF noted on MRI [DATE] which was 39%

2. Delayed gadolinium scan notable for anterior wall infarct similar
in appearance to prior MRI

3.  Normal RV size and function

4.  Normal cardiac valves

5.  No pericardial effusion

6.  Normal ascending thoracic aorta 3.0 cm

FEFE MEJAA

## 2021-10-07 MED ORDER — GADOBUTROL 1 MMOL/ML IV SOLN
8.0000 mL | Freq: Once | INTRAVENOUS | Status: AC | PRN
  Administered 2021-10-07: 8 mL via INTRAVENOUS

## 2021-10-13 DIAGNOSIS — Z1509 Genetic susceptibility to other malignant neoplasm: Secondary | ICD-10-CM | POA: Diagnosis not present

## 2021-10-13 DIAGNOSIS — K219 Gastro-esophageal reflux disease without esophagitis: Secondary | ICD-10-CM | POA: Diagnosis not present

## 2021-10-13 DIAGNOSIS — R002 Palpitations: Secondary | ICD-10-CM | POA: Diagnosis not present

## 2021-10-13 DIAGNOSIS — D630 Anemia in neoplastic disease: Secondary | ICD-10-CM | POA: Diagnosis not present

## 2021-10-13 DIAGNOSIS — L739 Follicular disorder, unspecified: Secondary | ICD-10-CM | POA: Diagnosis not present

## 2021-10-13 DIAGNOSIS — C7951 Secondary malignant neoplasm of bone: Secondary | ICD-10-CM | POA: Diagnosis not present

## 2021-10-13 DIAGNOSIS — M2550 Pain in unspecified joint: Secondary | ICD-10-CM | POA: Diagnosis not present

## 2021-10-13 DIAGNOSIS — C641 Malignant neoplasm of right kidney, except renal pelvis: Secondary | ICD-10-CM | POA: Diagnosis not present

## 2021-10-13 DIAGNOSIS — L309 Dermatitis, unspecified: Secondary | ICD-10-CM | POA: Diagnosis not present

## 2021-10-19 ENCOUNTER — Encounter (INDEPENDENT_AMBULATORY_CARE_PROVIDER_SITE_OTHER): Payer: Medicare HMO | Admitting: Ophthalmology

## 2021-10-21 ENCOUNTER — Encounter: Payer: Self-pay | Admitting: Family Medicine

## 2021-10-21 ENCOUNTER — Other Ambulatory Visit: Payer: Self-pay | Admitting: Family Medicine

## 2021-10-21 DIAGNOSIS — G893 Neoplasm related pain (acute) (chronic): Secondary | ICD-10-CM

## 2021-10-21 DIAGNOSIS — F329 Major depressive disorder, single episode, unspecified: Secondary | ICD-10-CM

## 2021-10-24 DIAGNOSIS — M19012 Primary osteoarthritis, left shoulder: Secondary | ICD-10-CM | POA: Diagnosis not present

## 2021-10-26 ENCOUNTER — Encounter: Payer: Self-pay | Admitting: Family Medicine

## 2021-10-26 DIAGNOSIS — C641 Malignant neoplasm of right kidney, except renal pelvis: Secondary | ICD-10-CM

## 2021-10-26 DIAGNOSIS — G893 Neoplasm related pain (acute) (chronic): Secondary | ICD-10-CM

## 2021-10-26 MED ORDER — HYDROCODONE-ACETAMINOPHEN 7.5-325 MG PO TABS: 2.0000 | ORAL_TABLET | ORAL | 0 refills | Status: DC

## 2021-10-27 ENCOUNTER — Encounter: Payer: Self-pay | Admitting: Family Medicine

## 2021-10-27 ENCOUNTER — Other Ambulatory Visit (HOSPITAL_COMMUNITY): Payer: Medicare HMO

## 2021-10-27 ENCOUNTER — Telehealth: Payer: Self-pay | Admitting: Family Medicine

## 2021-10-27 NOTE — Telephone Encounter (Signed)
Pt called stating that he is up in [place] and he had forgotten his meds at home. Pt was wondering if it would be safe to go without them until he returns or if not, would there be any way to send a temp supply to get him back home. Please Advise. ? ?DULoxetine (CYMBALTA) 20 MG capsule [[id number]]  ? ?sacubitril-valsartan (ENTRESTO) 24-26 MG [[id number]]  ? ?carvedilol (COREG) 3.125 MG tablet [[id number]]  ?

## 2021-10-27 NOTE — Telephone Encounter (Signed)
Douglas [name], Douglas Lipps  [name] ?Phone Number: [phone]  ? ?Hi Dr Lorelei Pont, I had left a message this morning and you can disregard it. I had left a couple of medications at home and was a little  panic this morning. I found a [facility] near the hotel and they were able to pull my info up in system and give me enough meds to get home . Sorry to bother your office this morning I didn't know what to do. Thanks again and have a good weekend, Douglas [name].  ?

## 2021-10-28 NOTE — Progress Notes (Shared)
?[facility] Note ? ?10/31/2021 ? ?  ? ?CHIEF COMPLAINT ?Patient presents for No chief complaint on file. ? ? ? ?HISTORY OF PRESENT ILLNESS: ?[name] is a 66 y.o. male who presents to the clinic today for:  ? ? ? ? ?Referring physician: ?Copland, Gay Filler, MD ?[address] ?[address] ?[place],  [place] [address] ? ?HISTORICAL INFORMATION:  ? ?Selected notes from the [facility] ?Referred by Dr. Wyatt Portela for concern of retinal tears  ? ?CURRENT MEDICATIONS: ?No current outpatient medications on file. (Ophthalmic Drugs)  ? ?No current facility-administered medications for this visit. (Ophthalmic Drugs)  ? ?Current Outpatient Medications (Other)  ?Medication Sig  ? aspirin EC 81 MG tablet Take 1 tablet (81 mg total) by mouth daily. Swallow whole.  ? B Complex Vitamins (B COMPLEX 100 PO) Take 100 mg by mouth daily.  ? Calcium Carb-Cholecalciferol (CALCIUM 600/VITAMIN D PO) Take 1 capsule by mouth daily.  ? calcium carbonate (TUMS - DOSED IN MG ELEMENTAL CALCIUM) 500 MG chewable tablet Chew 500 mg by mouth 3 (three) times daily as needed for indigestion or heartburn.  ? carvedilol (COREG) 3.125 MG tablet TAKE 1 TABLET (3.125 MG TOTAL) BY MOUTH 2 (TWO) TIMES DAILY WITH A MEAL.  ? cetirizine (ZYRTEC) 10 MG tablet Take 10 mg by mouth daily.  ? cholecalciferol (VITAMIN D3) 25 MCG (1000 UNIT) tablet Take 2,000 Units by mouth daily.  ? denosumab (XGEVA) 120 MG/1.7ML SOLN injection Inject 120 mg into the skin every 30 (thirty) days.   ? DULoxetine (CYMBALTA) 20 MG capsule TAKE ONE CAPSULE BY MOUTH DAILY  ? HYDROcodone-acetaminophen (NORCO) 7.5-325 MG tablet Take 2 tablets by mouth See admin instructions. Take 2 every 4 hours through out the day until taking ativan at bedtime, do not mix  ? loperamide (IMODIUM A-D) 2 MG tablet Take 2 mg by mouth 4 (four) times daily as needed for diarrhea or loose stools.  ? LORazepam (ATIVAN) 1 MG tablet Take 0.5-1 tablets (0.5-1 mg total) by  mouth at bedtime as needed for sleep. TAKE ONE HALF OR ONE TABLET BY MOUTH AT BEDTIME IF NEEDED FOR SLEEP, DO NOT TAKE WITH PAIN MEDS  ? Multiple Vitamins-Minerals (CENTRUM SILVER 50+MEN PO) Take 1 tablet by mouth daily.  ? naloxone (NARCAN) nasal spray 4 mg/0.1 mL Use one spray in nostril every 2-3 minutes as needed for drug overdose (cancer patient on opioid therapy)  ? nitroGLYCERIN (NITROSTAT) 0.4 MG SL tablet Place 1 tablet (0.4 mg total) under the tongue every 5 (five) minutes x 3 doses as needed for chest pain. Please dispense #25 x 4 bottle  ? Nivolumab (OPDIVO IV) Inject into the vein every 14 (fourteen) days.  ? ondansetron (ZOFRAN-ODT) 4 MG disintegrating tablet TAKE 1 TABLET BY MOUTH SUBLINGUALLY EVERY 8 HOURS AS NEEDED FOR NAUSEA OR VOMITING  ? pantoprazole (PROTONIX) 20 MG tablet Take 1 tablet (20 mg total) by mouth daily.  ? pseudoephedrine (SUDAFED) 30 MG tablet Take 30 mg by mouth once.  ? rosuvastatin (CRESTOR) 10 MG tablet TAKE 1 TABLET BY MOUTH EVERY DAY  ? sacubitril-valsartan (ENTRESTO) 24-26 MG Take 1 tablet by mouth 2 (two) times daily.  ? vitamin C (ASCORBIC ACID) 500 MG tablet Take 500 mg by mouth daily.  ? ?No current facility-administered medications for this visit. (Other)  ? ?REVIEW OF SYSTEMS: ? ? ?ALLERGIES ?Allergies  ?Allergen Reactions  ? Avelox [Moxifloxacin Hcl In Nacl] Hives, Shortness Of Breath and Swelling  ?  Tape Dermatitis and Rash  ?  Just the glue  ? ? ?PAST MEDICAL HISTORY ?Past Medical History:  ?Diagnosis Date  ? Allergic rhinitis   ? Anxiety   ? Arthritis   ? "all over my body" (10/16/2013)  ? Cataract   ? Chronic back pain   ? "neck to lower back" (10/16/2013)  ? Colon polyps   ? Compression fracture   ? S/P L-SPINE; pt does not recall this hx on 10/16/2013  ? COPD (chronic obstructive pulmonary disease) ([facility])   ? FVC .66  ? Coronary artery disease   ? Depression   ? "situational since 09/22/2013 OR"  ? Encounter for long-term (current) use of other medications   ? GERD  (gastroesophageal reflux disease)   ? Hemoptysis   ? 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 History of blood transfusion 09/2013  ? "think so; not sure; related to big OR"  ? History of surgical fusion joint   ? DDD C-SPINE  ? Hx of blood clots   ? Hyperlipidemia   ? Hypertension   ? Hypertensive retinopathy   ? Leiomyoma OF SKIN  ? INCREASED RISK RENAL CELL CA  NEEDS CT OF KIDNEYS EVERY 2 YEARS NEXT DUE  02/06/13  ? Melanoma in situ [facility])   ? Melanoma of back ([place])   ? Neuromuscular disorder ([place])   ? Osteoarthritis   ? Renal calculi   ? Renal cell carcinoma ([place])   ? type 2; papillary (metastized to bone)  ? Tobacco use disorder   ? Unspecified vitamin D deficiency   ? ?Past Surgical History:  ?Procedure Laterality Date  ? ANTERIOR CERVICAL DECOMP/DISCECTOMY FUSION  1992  ? ANTERIOR CRUCIATE LIGAMENT REPAIR Bilateral [phone]  ? [phone]  ? APPENDECTOMY  09/22/2013  ? CARDIAC CATHETERIZATION    ? CHOLECYSTECTOMY  09/22/2013  ? "had tumors in it"  ? CORONARY STENT INTERVENTION N/A 05/24/2020  ? Procedure: CORONARY STENT INTERVENTION;  Surgeon: Martinique, Peter M, MD;  Location: [facility];  Service: Cardiovascular;  Laterality: N/A;  ? HAND LIGAMENT RECONSTRUCTION Right   ? HARVEST BONE GRAFT Right 1992  ? "hip; for neck fusion"  ? INGUINAL HERNIA REPAIR Right 2004  ? KNEE SURGERY Right 1974  ? KNEE SURGERY Left 1975  ? LEFT HEART CATH AND CORONARY ANGIOGRAPHY N/A 05/24/2020  ? Procedure: LEFT HEART CATH AND CORONARY ANGIOGRAPHY;  Surgeon: Martinique, Peter M, MD;  Location: [facility];  Service: Cardiovascular;  Laterality: N/A;  ? LIGAMENT REPAIR Right ~ 2001  ? "little finger"  ? LYMPH NODE DISSECTION Right 09/22/2013  ? "in the area of the kidney; they were clean"  ? MELANOMA EXCISION  ~ 2007  ? "off my back"  ? PARTIAL NEPHRECTOMY Right 09/22/2013  ? removal of fascia overlying the right psoas Right 09/22/2013  ? RENAL ARTERY STENT Right 09/22/2013  ? RIGHT COLECTOMY Right  09/22/2013  ? ROBOTIC ASSITED PARTIAL NEPHRECTOMY  05/22/2012  ? Procedure: ROBOTIC ASSITED PARTIAL NEPHRECTOMY;  Surgeon: Alexis Frock, MD;  Location: [facility];  Service: Urology;  Laterality: Right;  Right Robotic Cyst Decortication and Partial Nephrectomy ?  ? SPINE SURGERY    ? ? ?FAMILY HISTORY ?Family History  ?Adopted: Yes  ?Problem Relation Age of Onset  ? Blindness Mother   ? Glaucoma Mother   ? Hypertension Father   ? ? ?SOCIAL HISTORY ?Social History  ? ?Tobacco Use  ? Smoking status: Former  ?  Packs/day: 0.50  ?  Years: 40.00  ?  Pack years: 20.00  ?  Types: Cigarettes  ? Smokeless tobacco: Never  ?Vaping Use  ? Vaping Use: Never used  ?Substance Use Topics  ? Alcohol use: Not Currently  ? Drug use: Yes  ?  Types: Marijuana  ?  ? ?  ?OPHTHALMIC EXAM: ? ?Not recorded ?  ? ? ?IMAGING AND PROCEDURES  ?Imaging and Procedures for 10/31/2021 ? ? ?  ?  ? ?  ?ASSESSMENT/PLAN: ? ?  ICD-10-CM   ?1. Lattice degeneration of left retina  H35.412   ?  ?2. Retinal holes, left  H33.322   ?  ?3. Retinal detachment, left  H33.22   ?  ?4. Lamellar macular hole of left eye  H35.342   ?  ?5. Epiretinal membrane (ERM) of left eye  H35.372   ?  ?6. Essential hypertension  I10   ?  ?7. Hypertensive retinopathy of both eyes  H35.033   ?  ?8. Combined forms of age-related cataract of both eyes  H25.813   ?  ? ? ?1-4. Lattice degeneration w/ atrophic holes, OS ?- Pigmented lattice inferiorly from 0500 to 0715 with large atrophic holes, positive SRF ?- s/p Retinopexy OS 08.25.22 --good laser changes surrounding  ?- monitor ? ?5,6.   Epiretinal membrane w/ lamellar macular hole, left eye  ?- The natural history, anatomy, potential for loss of vision, and treatment options including vitrectomy techniques and the complications of endophthalmitis, retinal detachment, vitreous hemorrhage, cataract progression and permanent vision loss discussed with the patient. ?- mild ERM w/ lamellar hoe ?- BCVA 20/25 ?- asymptomatic, no  metamorphopsia ?- no indication for surgery at this time ?- monitor for now ?- f/u in 6 months, sooner prn; DFE, OCT ? ?7,8. Hypertensive retinopathy OU ?- discussed importance of tight BP control ?- monitor ? ?9. Mi

## 2021-10-30 ENCOUNTER — Encounter (INDEPENDENT_AMBULATORY_CARE_PROVIDER_SITE_OTHER): Payer: Self-pay | Admitting: Ophthalmology

## 2021-10-30 ENCOUNTER — Encounter: Payer: Self-pay | Admitting: Family Medicine

## 2021-10-31 ENCOUNTER — Ambulatory Visit (INDEPENDENT_AMBULATORY_CARE_PROVIDER_SITE_OTHER): Payer: Medicare HMO | Admitting: Family Medicine

## 2021-10-31 ENCOUNTER — Encounter (INDEPENDENT_AMBULATORY_CARE_PROVIDER_SITE_OTHER): Payer: Medicare HMO | Admitting: Ophthalmology

## 2021-10-31 VITALS — BP 142/108 | HR 73 | Temp 97.9°F | Resp 18 | Ht 73.0 in | Wt 154.8 lb

## 2021-10-31 DIAGNOSIS — I10 Essential (primary) hypertension: Secondary | ICD-10-CM

## 2021-10-31 DIAGNOSIS — H35412 Lattice degeneration of retina, left eye: Secondary | ICD-10-CM

## 2021-10-31 DIAGNOSIS — K047 Periapical abscess without sinus: Secondary | ICD-10-CM

## 2021-10-31 DIAGNOSIS — R238 Other skin changes: Secondary | ICD-10-CM

## 2021-10-31 DIAGNOSIS — H25813 Combined forms of age-related cataract, bilateral: Secondary | ICD-10-CM

## 2021-10-31 DIAGNOSIS — H3322 Serous retinal detachment, left eye: Secondary | ICD-10-CM

## 2021-10-31 DIAGNOSIS — H35033 Hypertensive retinopathy, bilateral: Secondary | ICD-10-CM

## 2021-10-31 DIAGNOSIS — H33322 Round hole, left eye: Secondary | ICD-10-CM

## 2021-10-31 DIAGNOSIS — H35372 Puckering of macula, left eye: Secondary | ICD-10-CM

## 2021-10-31 DIAGNOSIS — H35342 Macular cyst, hole, or pseudohole, left eye: Secondary | ICD-10-CM

## 2021-10-31 MED ORDER — AMOXICILLIN 500 MG PO CAPS: 1000.0000 mg | ORAL_CAPSULE | Freq: Two times a day (BID) | ORAL | 0 refills | Status: DC

## 2021-10-31 MED ORDER — VALSARTAN 40 MG PO TABS: 40.0000 mg | ORAL_TABLET | Freq: Every day | ORAL | 3 refills | Status: DC

## 2021-10-31 NOTE — Telephone Encounter (Signed)
Pt scheduled  
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
## 2021-10-31 NOTE — Patient Instructions (Signed)
It was good to see you!  ?For rashes try some desitin/ white diaper cream and powder over the top.  Let me know if not healed up in the next few days ?For your BP- add valsartan 40 mg daily to your regimen ?Let me know how you do- we can increase to 80 or more if we need it  ?

## 2021-11-01 ENCOUNTER — Other Ambulatory Visit: Payer: Self-pay | Admitting: Family Medicine

## 2021-11-01 DIAGNOSIS — R1013 Epigastric pain: Secondary | ICD-10-CM

## 2021-11-08 ENCOUNTER — Encounter: Payer: Self-pay | Admitting: Family Medicine

## 2021-11-09 NOTE — Progress Notes (Signed)
[facility] Note  11/11/2021     CHIEF COMPLAINT Patient presents for Retina Follow Up  HISTORY OF PRESENT ILLNESS: Douglas [name] is a 66 y.o. male who presents to the clinic today for:   HPI     Retina Follow Up   Patient presents with  Other.  In both eyes.  Duration of 6 months.  Since onset it is stable.  I, the attending physician,  performed the HPI with the patient and updated documentation appropriately.        Comments   6 month follow up Lattice Degeneration OU-  He started a new cancer drug, Cabozantinib.  Seems like it is making his eyes dry.  Having some mucous as well.  Using Systane qd OU.  The new drug is also elevating his BP so he had to start on Valsartan.       Last edited by Bernarda Caffey, MD on 11/15/2021  9:41 AM.    Pt states he has started a new cancer medication, he feels like it is drying his eyes out  Referring physician: Darreld Mclean, MD [address] [place],  [place] [address]  HISTORICAL INFORMATION:   Selected notes from the MEDICAL RECORD NUMBER Referred by Dr. Wyatt Portela for concern of retinal tears   CURRENT MEDICATIONS: No current outpatient medications on file. (Ophthalmic Drugs)   No current facility-administered medications for this visit. (Ophthalmic Drugs)   Current Outpatient Medications (Other)  Medication Sig   amoxicillin (AMOXIL) 500 MG capsule Take 2 capsules (1,000 mg total) by mouth 2 (two) times daily.   aspirin EC 81 MG tablet Take 1 tablet (81 mg total) by mouth daily. Swallow whole.   B Complex Vitamins (B COMPLEX 100 PO) Take 100 mg by mouth daily.   cabozantinib (CABOMETYX) 60 MG tablet    Calcium Carb-Cholecalciferol (CALCIUM 600/VITAMIN D PO) Take 1 capsule by mouth daily.   calcium carbonate (TUMS - DOSED IN MG ELEMENTAL CALCIUM) 500 MG chewable tablet Chew 500 mg by mouth 3 (three) times daily as needed for indigestion or heartburn.   carvedilol (COREG) 3.125  MG tablet TAKE 1 TABLET (3.125 MG TOTAL) BY MOUTH 2 (TWO) TIMES DAILY WITH A MEAL.   cetirizine (ZYRTEC) 10 MG tablet Take 10 mg by mouth daily.   cholecalciferol (VITAMIN D3) 25 MCG (1000 UNIT) tablet Take 2,000 Units by mouth daily.   denosumab (XGEVA) 120 MG/1.7ML SOLN injection Inject 120 mg into the skin every 30 (thirty) days.    DULoxetine (CYMBALTA) 20 MG capsule TAKE ONE CAPSULE BY MOUTH DAILY   HYDROcodone-acetaminophen (NORCO) 7.5-325 MG tablet Take 2 tablets by mouth See admin instructions. Take 2 every 4 hours through out the day until taking ativan at bedtime, do not mix   loperamide (IMODIUM A-D) 2 MG tablet Take 2 mg by mouth 4 (four) times daily as needed for diarrhea or loose stools.   LORazepam (ATIVAN) 1 MG tablet Take 0.5-1 tablets (0.5-1 mg total) by mouth at bedtime as needed for sleep. TAKE ONE HALF OR ONE TABLET BY MOUTH AT BEDTIME IF NEEDED FOR SLEEP, DO NOT TAKE WITH PAIN MEDS   Multiple Vitamins-Minerals (CENTRUM SILVER 50+MEN PO) Take 1 tablet by mouth daily.   naloxone (NARCAN) nasal spray 4 mg/0.1 mL Use one spray in nostril every 2-3 minutes as needed for drug overdose (cancer patient on opioid therapy)   nitroGLYCERIN (NITROSTAT) 0.4 MG SL tablet Place 1 tablet (0.4 mg  total) under the tongue every 5 (five) minutes x 3 doses as needed for chest pain. Please dispense #25 x 4 bottle   ondansetron (ZOFRAN-ODT) 4 MG disintegrating tablet TAKE 1 TABLET BY MOUTH SUBLINGUALLY EVERY 8 HOURS AS NEEDED FOR NAUSEA OR VOMITING   pantoprazole (PROTONIX) 20 MG tablet TAKE 1 TABLET BY MOUTH EVERY DAY   pseudoephedrine (SUDAFED) 30 MG tablet Take 30 mg by mouth once.   rosuvastatin (CRESTOR) 10 MG tablet TAKE 1 TABLET BY MOUTH EVERY DAY   sacubitril-valsartan (ENTRESTO) 24-26 MG Take 1 tablet by mouth 2 (two) times daily.   valsartan (DIOVAN) 40 MG tablet Take 1 tablet (40 mg total) by mouth daily. Can increase to 80 mg as directed by MD (Patient taking differently: Take 80 mg by  mouth daily. Can increase to 80 mg as directed by MD)   vitamin C (ASCORBIC ACID) 500 MG tablet Take 500 mg by mouth daily.   No current facility-administered medications for this visit. (Other)   REVIEW OF SYSTEMS: ROS   Positive for: Gastrointestinal, Neurological, Musculoskeletal, Cardiovascular, Eyes, Respiratory Negative for: Constitutional, Skin, Genitourinary, HENT, Endocrine, Psychiatric, Allergic/Imm, Heme/Lymph Last edited by Leonie Douglas, COA on 11/11/2021  9:11 AM.     ALLERGIES Allergies  Allergen Reactions   Avelox [Moxifloxacin Hcl In Nacl] Hives, Shortness Of Breath and Swelling   Tape Dermatitis and Rash    Just the glue   PAST MEDICAL HISTORY Past Medical History:  Diagnosis Date   Allergic rhinitis    Anxiety    Arthritis    "all over my body" (10/16/2013)   Cataract    Chronic back pain    "neck to lower back" (10/16/2013)   Colon polyps    Compression fracture    S/P L-SPINE; pt does not recall this hx on 10/16/2013   COPD (chronic obstructive pulmonary disease) (HCC)    FVC .66   Coronary artery disease    Depression    "situational since 09/22/2013 OR"   Encounter for long-term (current) use of other medications    GERD (gastroesophageal reflux disease)    Hemoptysis    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)    History of blood transfusion 09/2013   "think so; not sure; related to big OR"   History of surgical fusion joint    DDD C-SPINE   Hx of blood clots    Hyperlipidemia    Hypertension    Hypertensive retinopathy    Leiomyoma OF SKIN   INCREASED RISK RENAL CELL CA  NEEDS CT OF KIDNEYS EVERY 2 YEARS NEXT DUE  02/06/13   Melanoma in situ ([place])    Melanoma of back ([place])    Neuromuscular disorder ([place])    Osteoarthritis    Renal calculi    Renal cell carcinoma ([place])    type 2; papillary (metastized to bone)   Tobacco use disorder    Unspecified vitamin D deficiency    Past Surgical History:  Procedure Laterality Date    ANTERIOR CERVICAL DECOMP/DISCECTOMY FUSION  1992   ANTERIOR CRUCIATE LIGAMENT REPAIR Bilateral [phone]   APPENDECTOMY  09/22/2013   CARDIAC CATHETERIZATION     CHOLECYSTECTOMY  09/22/2013   "had tumors in it"   CORONARY STENT INTERVENTION N/A 05/24/2020   Procedure: CORONARY STENT INTERVENTION;  Surgeon: Martinique, Peter M, MD;  Location: [facility];  Service: Cardiovascular;  Laterality: N/A;   HAND LIGAMENT RECONSTRUCTION Right    HARVEST BONE GRAFT Right 1992   "  hip; for neck fusion"   INGUINAL HERNIA REPAIR Right 2004   KNEE SURGERY Right 1974   KNEE SURGERY Left 1975   LEFT HEART CATH AND CORONARY ANGIOGRAPHY N/A 05/24/2020   Procedure: LEFT HEART CATH AND CORONARY ANGIOGRAPHY;  Surgeon: Martinique, Peter M, MD;  Location: [facility];  Service: Cardiovascular;  Laterality: N/A;   LIGAMENT REPAIR Right ~ 2001   "little finger"   LYMPH NODE DISSECTION Right 09/22/2013   "in the area of the kidney; they were clean"   MELANOMA EXCISION  ~ 2007   "off my back"   PARTIAL NEPHRECTOMY Right 09/22/2013   removal of fascia overlying the right psoas Right 09/22/2013   RENAL ARTERY STENT Right 09/22/2013   RIGHT COLECTOMY Right 09/22/2013   ROBOTIC ASSITED PARTIAL NEPHRECTOMY  05/22/2012   Procedure: ROBOTIC ASSITED PARTIAL NEPHRECTOMY;  Surgeon: Alexis Frock, MD;  Location: [facility];  Service: Urology;  Laterality: Right;  Right Robotic Cyst Decortication and Partial Nephrectomy    SPINE SURGERY     FAMILY HISTORY Family History  Adopted: Yes  Problem Relation Age of Onset   Blindness Mother    Glaucoma Mother    Hypertension Father    SOCIAL HISTORY Social History   Tobacco Use   Smoking status: Former    Packs/day: 0.50    Years: 40.00    Pack years: 20.00    Types: Cigarettes   Smokeless tobacco: Never  Vaping Use   Vaping Use: Never used  Substance Use Topics   Alcohol use: Not Currently   Drug use: Yes    Types: Marijuana        OPHTHALMIC EXAM:  Base Eye Exam     Visual Acuity (Snellen - Linear)       Right Left   Dist cc 20/20- 20/30   Dist ph cc  NI    Correction: Glasses         Tonometry (Tonopen, 9:18 AM)       Right Left   Pressure 13 13         Pupils       Dark Light Shape React APD   Right 4 3 Round Brisk None   Left 4 3 Round Brisk None         Visual Fields (Counting fingers)       Left Right    Full Full         Extraocular Movement       Right Left    Full Full         Neuro/Psych     Oriented x3: Yes   Mood/Affect: Normal         Dilation     Both eyes: 1.0% Mydriacyl, 2.5% Phenylephrine @ 9:18 AM           Slit Lamp and Fundus Exam     External Exam       Right Left   External Normal Normal         Slit Lamp Exam       Right Left   Lids/Lashes Dermatochalasis - upper lid, mild Telangiectasia Mild Meibomian gland dysfunction, mild Telangiectasia   Conjunctiva/Sclera White and quiet, Mild temp Pinguecula White and quiet, Mild temp Pinguecula   Cornea mild arcus, trace EBMD, trace tear film debris, no significant PEE 1+ Punctate epithelial erosions, mild arcus, mild tear film debris   Anterior Chamber Deep and quiet Deep and quiet   Iris Round and dilated Round and  dilated   Lens 2+ Nuclear sclerosis, 2-3+ Cortical cataract 2+ Nuclear sclerosis, 2-3+ Cortical cataract   Anterior Vitreous Vitreous syneresis, Posterior vitreous detachment Vitreous syneresis, no pigment, Posterior vitreous detachment, Vitreous condensations         Fundus Exam       Right Left   Disc Pink and sharp, Compact, Temporal PPA Pink and sharp, Compact   C/D Ratio 0.2 0.2   Macula Flat, Blunted foveal reflex, no Heme, mild RPE mottling Flat, Blunted foveal reflex, ERM, mild cystic changes, no Heme   Vessels mild attenuation Tortuous, Copper wiring, Vascular attenuation   Periphery Attached, No heme Pigmented lattice inferiorly from 0500 to 0715 with large  atrophic holes -- positive SRF -- good laser changes surrounding, no new RT/RD/lattice           Refraction     Wearing Rx       Sphere Cylinder Axis   Right -2.75 +0.75 029   Left -2.75 +0.75 146    Type: SVL           IMAGING AND PROCEDURES  Imaging and Procedures for 11/11/2021  OCT, Retina - OU - Both Eyes       Right Eye Quality was good. Central Foveal Thickness: 252. Progression has been stable. Findings include normal foveal contour, no IRF, no SRF.   Left Eye Quality was good. Central Foveal Thickness: 322. Progression has been stable. Findings include abnormal foveal contour, no SRF, macular pucker, epiretinal membrane, lamellar hole, intraretinal fluid (Mild ERM with lamellar hole and central cystic changes -- stable).   Notes *Images captured and stored on drive  Diagnosis / Impression:  OD: NFP; no IRF/SRF  OS: Mild ERM with lamellar hole and central cystic changes--stable  Clinical management:  See below  Abbreviations: NFP - Normal foveal profile. CME - cystoid macular edema. PED - pigment epithelial detachment. IRF - intraretinal fluid. SRF - subretinal fluid. EZ - ellipsoid zone. ERM - epiretinal membrane. ORA - outer retinal atrophy. ORT - outer retinal tubulation. SRHM - subretinal hyper-reflective material. IRHM - intraretinal hyper-reflective material             ASSESSMENT/PLAN:    ICD-10-CM   1. Lattice degeneration of left retina  H35.412     2. Retinal holes, left  H33.322     3. Retinal detachment, left  H33.22     4. Epiretinal membrane (ERM) of left eye  H35.372 OCT, Retina - OU - Both Eyes    5. Lamellar macular hole of left eye  H35.342     6. Essential hypertension  I10     7. Hypertensive retinopathy of both eyes  H35.033     8. Combined forms of age-related cataract of both eyes  H25.813      1-3. Lattice degeneration w/ atrophic holes, OS - Pigmented lattice inferiorly from 0500 to 0715 with large atrophic  holes, positive SRF - s/p laser retinopexy OS 08.25.22 --good laser changes surrounding  - monitor   4,5. Epiretinal membrane w/ lamellar macular hole, left eye  - mild ERM w/ lamellar hole - BCVA 20/30 - asymptomatic, no metamorphopsia - no indication for surgery at this time - monitor for now - F/u in 9-12 mos, sooner prn -- DFE&OCT  6,7. Hypertensive retinopathy OU - discussed importance of tight BP control - monitor   8. Mixed Cataract, OU - The symptoms of cataract, surgical options, and treatments and risks were discussed with patient. - discussed diagnosis and progression -  not yet visually significant - monitor for now  Ophthalmic Meds Ordered this visit:  No orders of the defined types were placed in this encounter.    Return for f/u 9-12 months, ERM OS, DFE, OCT.  There are no Patient Instructions on file for this visit.  Explained the diagnoses, plan, and follow up with the patient and they expressed understanding.  Patient expressed understanding of the importance of proper follow up care.   This document serves as a record of services personally performed by Gardiner Sleeper, MD, PhD. It was created on their behalf by Leonie Douglas, an ophthalmic technician. The creation of this record is the provider's dictation and/or activities during the visit.    Electronically signed by: Leonie Douglas COA, 11/15/21  9:44 AM  This document serves as a record of services personally performed by Gardiner Sleeper, MD, PhD. It was created on their behalf by San Jetty. Owens Shark, OA an ophthalmic technician. The creation of this record is the provider's dictation and/or activities during the visit.    Electronically signed by: San Jetty. Owens Shark, [place] 05.26.2023 9:44 AM  Gardiner Sleeper, M.D., Ph.D. Diseases & Surgery of the Retina and Vitreous [facility]  I have reviewed the above documentation for accuracy and completeness, and I agree with the above. Gardiner Sleeper, M.D., Ph.D. 11/15/21 9:44 AM  Abbreviations: M myopia (nearsighted); A astigmatism; H hyperopia (farsighted); P presbyopia; Mrx spectacle prescription;  CTL contact lenses; OD right eye; OS left eye; OU both eyes  XT exotropia; ET esotropia; PEK punctate epithelial keratitis; PEE punctate epithelial erosions; DES dry eye syndrome; MGD meibomian gland dysfunction; ATs artificial tears; PFAT's preservative free artificial tears; [place] nuclear sclerotic cataract; PSC posterior subcapsular cataract; ERM epi-retinal membrane; PVD posterior vitreous detachment; RD retinal detachment; DM diabetes mellitus; DR diabetic retinopathy; NPDR non-proliferative diabetic retinopathy; PDR proliferative diabetic retinopathy; CSME clinically significant macular edema; DME diabetic macular edema; dbh dot blot hemorrhages; CWS cotton wool spot; POAG primary open angle glaucoma; C/D cup-to-disc ratio; HVF humphrey visual field; GVF goldmann visual field; OCT optical coherence tomography; IOP intraocular pressure; BRVO Branch retinal vein occlusion; CRVO central retinal vein occlusion; CRAO central retinal artery occlusion; BRAO branch retinal artery occlusion; RT retinal tear; SB scleral buckle; PPV pars plana vitrectomy; VH Vitreous hemorrhage; PRP panretinal laser photocoagulation; IVK intravitreal kenalog; VMT vitreomacular traction; MH Macular hole;  NVD neovascularization of the disc; NVE neovascularization elsewhere; AREDS age related eye disease study; ARMD age related macular degeneration; POAG primary open angle glaucoma; EBMD epithelial/anterior basement membrane dystrophy; ACIOL anterior chamber intraocular lens; IOL intraocular lens; PCIOL posterior chamber intraocular lens; Phaco/IOL phacoemulsification with intraocular lens placement; [place] photorefractive keratectomy; LASIK laser assisted in situ keratomileusis; HTN hypertension; DM diabetes mellitus; COPD chronic obstructive pulmonary disease

## 2021-11-10 ENCOUNTER — Encounter: Payer: Self-pay | Admitting: Family Medicine

## 2021-11-11 ENCOUNTER — Encounter (INDEPENDENT_AMBULATORY_CARE_PROVIDER_SITE_OTHER): Payer: Self-pay | Admitting: Ophthalmology

## 2021-11-11 ENCOUNTER — Ambulatory Visit (INDEPENDENT_AMBULATORY_CARE_PROVIDER_SITE_OTHER): Payer: Medicare HMO | Admitting: Ophthalmology

## 2021-11-11 ENCOUNTER — Other Ambulatory Visit: Payer: Self-pay | Admitting: Family Medicine

## 2021-11-11 DIAGNOSIS — H33322 Round hole, left eye: Secondary | ICD-10-CM

## 2021-11-11 DIAGNOSIS — I10 Essential (primary) hypertension: Secondary | ICD-10-CM

## 2021-11-11 DIAGNOSIS — H35342 Macular cyst, hole, or pseudohole, left eye: Secondary | ICD-10-CM | POA: Diagnosis not present

## 2021-11-11 DIAGNOSIS — H35412 Lattice degeneration of retina, left eye: Secondary | ICD-10-CM | POA: Diagnosis not present

## 2021-11-11 DIAGNOSIS — H3322 Serous retinal detachment, left eye: Secondary | ICD-10-CM | POA: Diagnosis not present

## 2021-11-11 DIAGNOSIS — H35372 Puckering of macula, left eye: Secondary | ICD-10-CM | POA: Diagnosis not present

## 2021-11-11 DIAGNOSIS — H25813 Combined forms of age-related cataract, bilateral: Secondary | ICD-10-CM

## 2021-11-11 DIAGNOSIS — H35033 Hypertensive retinopathy, bilateral: Secondary | ICD-10-CM

## 2021-11-15 ENCOUNTER — Encounter (INDEPENDENT_AMBULATORY_CARE_PROVIDER_SITE_OTHER): Payer: Self-pay | Admitting: Ophthalmology

## 2021-11-17 DIAGNOSIS — M2550 Pain in unspecified joint: Secondary | ICD-10-CM | POA: Diagnosis not present

## 2021-11-17 DIAGNOSIS — Z1509 Genetic susceptibility to other malignant neoplasm: Secondary | ICD-10-CM | POA: Diagnosis not present

## 2021-11-17 DIAGNOSIS — R530 Neoplastic (malignant) related fatigue: Secondary | ICD-10-CM | POA: Diagnosis not present

## 2021-11-17 DIAGNOSIS — K219 Gastro-esophageal reflux disease without esophagitis: Secondary | ICD-10-CM | POA: Diagnosis not present

## 2021-11-17 DIAGNOSIS — R002 Palpitations: Secondary | ICD-10-CM | POA: Diagnosis not present

## 2021-11-17 DIAGNOSIS — C649 Malignant neoplasm of unspecified kidney, except renal pelvis: Secondary | ICD-10-CM | POA: Diagnosis not present

## 2021-11-17 DIAGNOSIS — D481 Neoplasm of uncertain behavior of connective and other soft tissue: Secondary | ICD-10-CM | POA: Diagnosis not present

## 2021-11-17 DIAGNOSIS — C7951 Secondary malignant neoplasm of bone: Secondary | ICD-10-CM | POA: Diagnosis not present

## 2021-11-17 DIAGNOSIS — L739 Follicular disorder, unspecified: Secondary | ICD-10-CM | POA: Diagnosis not present

## 2021-11-17 DIAGNOSIS — M79675 Pain in left toe(s): Secondary | ICD-10-CM | POA: Diagnosis not present

## 2021-11-17 DIAGNOSIS — M6281 Muscle weakness (generalized): Secondary | ICD-10-CM | POA: Diagnosis not present

## 2021-11-17 DIAGNOSIS — D649 Anemia, unspecified: Secondary | ICD-10-CM | POA: Diagnosis not present

## 2021-11-24 ENCOUNTER — Encounter: Payer: Self-pay | Admitting: Family Medicine

## 2021-11-24 DIAGNOSIS — G893 Neoplasm related pain (acute) (chronic): Secondary | ICD-10-CM

## 2021-11-24 DIAGNOSIS — C641 Malignant neoplasm of right kidney, except renal pelvis: Secondary | ICD-10-CM

## 2021-11-25 MED ORDER — HYDROCODONE-ACETAMINOPHEN 7.5-325 MG PO TABS: 2.0000 | ORAL_TABLET | ORAL | 0 refills | Status: DC

## 2021-11-26 ENCOUNTER — Other Ambulatory Visit: Payer: Self-pay | Admitting: Cardiovascular Disease

## 2021-12-01 DIAGNOSIS — C641 Malignant neoplasm of right kidney, except renal pelvis: Secondary | ICD-10-CM | POA: Diagnosis not present

## 2021-12-01 DIAGNOSIS — C7951 Secondary malignant neoplasm of bone: Secondary | ICD-10-CM | POA: Diagnosis not present

## 2021-12-01 DIAGNOSIS — K219 Gastro-esophageal reflux disease without esophagitis: Secondary | ICD-10-CM | POA: Diagnosis not present

## 2021-12-01 DIAGNOSIS — M25521 Pain in right elbow: Secondary | ICD-10-CM | POA: Diagnosis not present

## 2021-12-01 DIAGNOSIS — Z79899 Other long term (current) drug therapy: Secondary | ICD-10-CM | POA: Diagnosis not present

## 2021-12-01 DIAGNOSIS — C649 Malignant neoplasm of unspecified kidney, except renal pelvis: Secondary | ICD-10-CM | POA: Diagnosis not present

## 2021-12-01 DIAGNOSIS — L738 Other specified follicular disorders: Secondary | ICD-10-CM | POA: Diagnosis not present

## 2021-12-01 DIAGNOSIS — M25569 Pain in unspecified knee: Secondary | ICD-10-CM | POA: Diagnosis not present

## 2021-12-01 DIAGNOSIS — R002 Palpitations: Secondary | ICD-10-CM | POA: Diagnosis not present

## 2021-12-01 DIAGNOSIS — D481 Neoplasm of uncertain behavior of connective and other soft tissue: Secondary | ICD-10-CM | POA: Diagnosis not present

## 2021-12-01 DIAGNOSIS — D649 Anemia, unspecified: Secondary | ICD-10-CM | POA: Diagnosis not present

## 2021-12-01 DIAGNOSIS — M25549 Pain in joints of unspecified hand: Secondary | ICD-10-CM | POA: Diagnosis not present

## 2021-12-01 DIAGNOSIS — M2550 Pain in unspecified joint: Secondary | ICD-10-CM | POA: Diagnosis not present

## 2021-12-01 DIAGNOSIS — L739 Follicular disorder, unspecified: Secondary | ICD-10-CM | POA: Diagnosis not present

## 2021-12-07 ENCOUNTER — Encounter: Payer: Self-pay | Admitting: Family Medicine

## 2021-12-13 ENCOUNTER — Other Ambulatory Visit: Payer: Self-pay | Admitting: Surgery

## 2021-12-13 ENCOUNTER — Telehealth: Payer: Self-pay | Admitting: *Deleted

## 2021-12-13 DIAGNOSIS — K4090 Unilateral inguinal hernia, without obstruction or gangrene, not specified as recurrent: Secondary | ICD-10-CM | POA: Diagnosis not present

## 2021-12-13 NOTE — Telephone Encounter (Signed)
   Patient Name: Douglas [name]  DOB: 06-28-1955 MRN: [id number]  Primary Cardiologist: Charlton Haws, MD  Chart reviewed as part of pre-operative protocol coverage Last OV 07/2021, anticipate virtual visit call.  For inguinal hernia repair, this is considered a moderate bleeding risk procedure - anticipate we will recommend to continue ASA unless surgeon feels risk is too high in which we need MD input to hold. Will reach out to Dr. Eden Emms for permission to  on holding ASA as requested as patient has history of proximal LAD stent after MI in 2021. Dr. Eden Emms - Please route response to P [facility] DIV PREOP (the pre-op pool). Thank you.   Laurann Montana, PA-C 12/13/2021, 1:42 PM

## 2021-12-13 NOTE — Telephone Encounter (Signed)
   Pre-operative Risk Assessment    Patient Name: Douglas [name]  DOB: Dec 08, 1955 MRN: [id number]      Request for Surgical Clearance    Procedure:   INGUINAL HERNIA REPAIR  Date of Surgery:  Clearance TBD                                 Surgeon:  DR. Abigail Miyamoto Surgeon's Group or Practice Name:  [facility] Phone number:  [phone] Fax number:  [phone] ATTN: Brendia Sacks, MA   Type of Clearance Requested:   - Medical ; ASA    Type of Anesthesia:  General    Additional requests/questions:    Elpidio Anis   12/13/2021, 1:12 PM

## 2021-12-14 ENCOUNTER — Encounter: Payer: Self-pay | Admitting: Family Medicine

## 2021-12-14 NOTE — Telephone Encounter (Signed)
   Name: Douglas [name]  DOB: March 23, 1956  MRN: [id number]   Primary Cardiologist: Jenkins Rouge, MD  Chart reviewed as part of pre-operative protocol coverage.   Per [name]: He had a non ischemic myovue in 04/2021 moderate risk ok to proceed with LMA or sedation. Prefer to keep on 81 mg ASA.    I will route this recommendation to the requesting party via Epic fax function and remove from pre-op pool. Please call with questions.  Tami Lin Sharvil Hoey, PA 12/14/2021, 3:23 PM

## 2021-12-21 ENCOUNTER — Other Ambulatory Visit: Payer: Self-pay | Admitting: Family Medicine

## 2021-12-21 ENCOUNTER — Encounter: Payer: Self-pay | Admitting: Family Medicine

## 2021-12-21 DIAGNOSIS — C641 Malignant neoplasm of right kidney, except renal pelvis: Secondary | ICD-10-CM

## 2021-12-21 DIAGNOSIS — G893 Neoplasm related pain (acute) (chronic): Secondary | ICD-10-CM

## 2021-12-21 DIAGNOSIS — F329 Major depressive disorder, single episode, unspecified: Secondary | ICD-10-CM

## 2021-12-21 MED ORDER — LORAZEPAM 1 MG PO TABS: 0.5000 mg | ORAL_TABLET | Freq: Every evening | ORAL | 3 refills | Status: DC | PRN

## 2021-12-21 MED ORDER — HYDROCODONE-ACETAMINOPHEN 7.5-325 MG PO TABS: 2.0000 | ORAL_TABLET | ORAL | 0 refills | Status: DC

## 2021-12-22 DIAGNOSIS — Z79899 Other long term (current) drug therapy: Secondary | ICD-10-CM | POA: Diagnosis not present

## 2021-12-22 DIAGNOSIS — C649 Malignant neoplasm of unspecified kidney, except renal pelvis: Secondary | ICD-10-CM | POA: Diagnosis not present

## 2021-12-22 DIAGNOSIS — C641 Malignant neoplasm of right kidney, except renal pelvis: Secondary | ICD-10-CM | POA: Diagnosis not present

## 2021-12-22 DIAGNOSIS — D649 Anemia, unspecified: Secondary | ICD-10-CM | POA: Diagnosis not present

## 2021-12-22 DIAGNOSIS — K219 Gastro-esophageal reflux disease without esophagitis: Secondary | ICD-10-CM | POA: Diagnosis not present

## 2021-12-22 DIAGNOSIS — M25549 Pain in joints of unspecified hand: Secondary | ICD-10-CM | POA: Diagnosis not present

## 2021-12-22 DIAGNOSIS — L02423 Furuncle of right upper limb: Secondary | ICD-10-CM | POA: Diagnosis not present

## 2021-12-22 DIAGNOSIS — L309 Dermatitis, unspecified: Secondary | ICD-10-CM | POA: Diagnosis not present

## 2021-12-22 DIAGNOSIS — M2550 Pain in unspecified joint: Secondary | ICD-10-CM | POA: Diagnosis not present

## 2021-12-22 DIAGNOSIS — M25521 Pain in right elbow: Secondary | ICD-10-CM | POA: Diagnosis not present

## 2021-12-22 DIAGNOSIS — R002 Palpitations: Secondary | ICD-10-CM | POA: Diagnosis not present

## 2021-12-22 DIAGNOSIS — Z79891 Long term (current) use of opiate analgesic: Secondary | ICD-10-CM | POA: Diagnosis not present

## 2021-12-22 DIAGNOSIS — Z1509 Genetic susceptibility to other malignant neoplasm: Secondary | ICD-10-CM | POA: Diagnosis not present

## 2021-12-22 DIAGNOSIS — M25569 Pain in unspecified knee: Secondary | ICD-10-CM | POA: Diagnosis not present

## 2021-12-22 DIAGNOSIS — L664 Folliculitis ulerythematosa reticulata: Secondary | ICD-10-CM | POA: Diagnosis not present

## 2021-12-22 DIAGNOSIS — G893 Neoplasm related pain (acute) (chronic): Secondary | ICD-10-CM | POA: Diagnosis not present

## 2021-12-22 DIAGNOSIS — C7951 Secondary malignant neoplasm of bone: Secondary | ICD-10-CM | POA: Diagnosis not present

## 2021-12-22 DIAGNOSIS — Z79620 Long term (current) use of immunosuppressive biologic: Secondary | ICD-10-CM | POA: Diagnosis not present

## 2021-12-22 NOTE — Progress Notes (Signed)
Patients EF 35-40% and does not fit our guidelines for [facility]. Spoke with Ena Dawley at [facility] that patient would need to be moved to main OR.

## 2021-12-23 NOTE — Pre-Procedure Instructions (Signed)
Surgical Instructions    Your procedure is scheduled on Wednesday, July 12th.  Report to [facility] Main Entrance "A" at 11:30 A.M., then check in with the Admitting office.  Call this number if you have problems the morning of surgery:  [phone]   If you have any questions prior to your surgery date call [phone]: Open Monday-Friday 8am-4pm    Remember:  Do not eat after midnight the night before your surgery  You may drink clear liquids until 10:30 AM the morning of your surgery.   Clear liquids allowed are: Water, Non-Citrus Juices (without pulp), Carbonated Beverages, Clear Tea, Black Coffee Only (NO MILK, CREAM OR POWDERED CREAMER of any kind), and Gatorade.   Patient Instructions  The night before surgery:  No food after midnight. ONLY clear liquids after midnight  The day of surgery (if you do NOT have diabetes):  Drink ONE (1) Pre-Surgery Clear Ensure by 10:30 AM the morning of surgery. Drink in one sitting. Do not sip.  This drink was given to you during your hospital  pre-op appointment visit.  Nothing else to drink after completing the  Pre-Surgery Clear Ensure.         If you have questions, please contact your surgeon's office.      Take these medicines the morning of surgery with A SIP OF WATER  carvedilol (COREG) cetirizine (ZYRTEC)  DULoxetine (CYMBALTA)  HYDROcodone-acetaminophen (NORCO)  nitroGLYCERIN (NITROSTAT)- if needed ondansetron (ZOFRAN-ODT)- if needed pantoprazole (PROTONIX)  rosuvastatin (CRESTOR)   Follow your surgeon's instructions on when to stop Aspirin.  If no instructions were given by your surgeon then you will need to call the office to get those instructions.     As of today, STOP taking any Aspirin (unless otherwise instructed by your surgeon) Aleve, Naproxen, Ibuprofen, Motrin, Advil, Goody's, BC's, all herbal medications, fish oil, and all vitamins.                     Do NOT Smoke (Tobacco/Vaping) for 24 hours prior to  your procedure.  If you use a CPAP at night, you may bring your mask/headgear for your overnight stay.   Contacts, glasses, piercing's, hearing aid's, dentures or partials may not be worn into surgery, please bring cases for these belongings.    For patients admitted to the hospital, discharge time will be determined by your treatment team.   Patients discharged the day of surgery will not be allowed to drive home, and someone needs to stay with them for 24 hours.  SURGICAL WAITING ROOM VISITATION Patients having surgery or a procedure may have two support people in the waiting room. These visitors may be switched out with other visitors if needed. Children under the age of 47 must have an adult accompany them who is not the patient. If the patient needs to stay at the hospital during part of their recovery, the visitor guidelines for inpatient rooms apply.  Please refer to the [facility] website for the visitor guidelines for Inpatients (after your surgery is over and you are in a regular room).    Special instructions:   [facility]- Preparing For Surgery  Before surgery, you can play an important role. Because skin is not sterile, your skin needs to be as free of germs as possible. You can reduce the number of germs on your skin by washing with CHG (chlorahexidine gluconate) Soap before surgery.  CHG is an antiseptic cleaner which kills germs and bonds with the skin to continue  killing germs even after washing.    Oral Hygiene is also important to reduce your risk of infection.  Remember - BRUSH YOUR TEETH THE MORNING OF SURGERY WITH YOUR REGULAR TOOTHPASTE  Please do not use if you have an allergy to CHG or antibacterial soaps. If your skin becomes reddened/irritated stop using the CHG.  Do not shave (including legs and underarms) for at least 48 hours prior to first CHG shower. It is OK to shave your face.  Please follow these instructions carefully.   Shower the NIGHT BEFORE  SURGERY and the MORNING OF SURGERY  If you chose to wash your hair, wash your hair first as usual with your normal shampoo.  After you shampoo, rinse your hair and body thoroughly to remove the shampoo.  Use CHG Soap as you would any other liquid soap. You can apply CHG directly to the skin and wash gently with a scrungie or a clean washcloth.   Apply the CHG Soap to your body ONLY FROM THE NECK DOWN.  Do not use on open wounds or open sores. Avoid contact with your eyes, ears, mouth and genitals (private parts). Wash Face and genitals (private parts)  with your normal soap.   Wash thoroughly, paying special attention to the area where your surgery will be performed.  Thoroughly rinse your body with warm water from the neck down.  DO NOT shower/wash with your normal soap after using and rinsing off the CHG Soap.  Pat yourself dry with a CLEAN TOWEL.  Wear CLEAN PAJAMAS to bed the night before surgery  Place CLEAN SHEETS on your bed the night before your surgery  DO NOT SLEEP WITH PETS.   Day of Surgery: Take a shower with CHG soap. Do not wear jewelry  Do not wear lotions, powders, colognes, or deodorant.  Men may shave face and neck. Do not bring valuables to the hospital.  [facility] is not responsible for any belongings or valuables.  Wear Clean/Comfortable clothing the morning of surgery Remember to brush your teeth WITH YOUR REGULAR TOOTHPASTE.   Please read over the following fact sheets that you were given.    If you received a COVID test during your pre-op visit  it is requested that you wear a mask when out in public, stay away from anyone that may not be feeling well and notify your surgeon if you develop symptoms. If you have been in contact with anyone that has tested positive in the last 10 days please notify you surgeon.

## 2021-12-26 ENCOUNTER — Other Ambulatory Visit: Payer: Self-pay

## 2021-12-26 ENCOUNTER — Encounter (HOSPITAL_COMMUNITY)
Admission: RE | Admit: 2021-12-26 | Discharge: 2021-12-26 | Disposition: A | Discharge status: Home / Self Care | Payer: Medicare HMO | Source: Ambulatory Visit | Attending: Surgery | Admitting: Surgery

## 2021-12-26 ENCOUNTER — Encounter (HOSPITAL_COMMUNITY): Payer: Self-pay

## 2021-12-26 DIAGNOSIS — I252 Old myocardial infarction: Secondary | ICD-10-CM | POA: Insufficient documentation

## 2021-12-26 DIAGNOSIS — C649 Malignant neoplasm of unspecified kidney, except renal pelvis: Secondary | ICD-10-CM | POA: Diagnosis not present

## 2021-12-26 DIAGNOSIS — C7800 Secondary malignant neoplasm of unspecified lung: Secondary | ICD-10-CM | POA: Insufficient documentation

## 2021-12-26 DIAGNOSIS — Z01818 Encounter for other preprocedural examination: Secondary | ICD-10-CM | POA: Insufficient documentation

## 2021-12-26 DIAGNOSIS — C7951 Secondary malignant neoplasm of bone: Secondary | ICD-10-CM | POA: Diagnosis not present

## 2021-12-26 DIAGNOSIS — D481 Neoplasm of uncertain behavior of connective and other soft tissue: Secondary | ICD-10-CM | POA: Diagnosis not present

## 2021-12-26 DIAGNOSIS — I2510 Atherosclerotic heart disease of native coronary artery without angina pectoris: Secondary | ICD-10-CM | POA: Insufficient documentation

## 2021-12-26 DIAGNOSIS — C787 Secondary malignant neoplasm of liver and intrahepatic bile duct: Secondary | ICD-10-CM | POA: Diagnosis not present

## 2021-12-26 NOTE — Progress Notes (Signed)
PCP - Dr. Janett Billow [place] Cardiologist - Dr. Jenkins Rouge  PPM/ICD - denies   Chest x-ray - 03/19/15 EKG - 04/12/21 Stress Test - 04/21/21 ECHO - 10/04/20 Cardiac Cath - 05/24/20  Sleep Study - denies   DM- denies  Blood Thinner Instructions: n/a Aspirin Instructions: pt instructed to f/u with surgeon regarding ASA  ERAS Protcol - yes PRE-SURGERY Ensure given at PAT  COVID TEST- n/a   Anesthesia review: yes, cardiac hx  Patient denies shortness of breath, fever, cough and chest pain at PAT appointment   All instructions explained to the patient, with a verbal understanding of the material. Patient agrees to go over the instructions while at home for a better understanding. The opportunity to ask questions was provided.

## 2021-12-27 NOTE — Anesthesia Preprocedure Evaluation (Signed)
Anesthesia Evaluation  Patient identified by MRN, date of birth, ID band Patient awake    Reviewed: Allergy & Precautions, NPO status , Patient's Chart, lab work & pertinent test results, reviewed documented beta blocker date and time   History of Anesthesia Complications Negative for: history of anesthetic complications  Airway Mallampati: I  TM Distance: >3 FB Neck ROM: Full    Dental  (+) Poor Dentition, Missing, Loose, Dental Advisory Given   Pulmonary COPD, former smoker,    breath sounds clear to auscultation       Cardiovascular hypertension, Pt. on home beta blockers and Pt. on medications (-) angina+ CAD, + Past MI and +CHF   Rhythm:Regular Rate:Normal  04/2021 stress test: There is a fixed, large (15-20% of the LV), severe perfusion defect present in the mid septum, apical septum into the apex consistent with LAD  infarction.  No ischemia is present.  Moderately reduced LVEF, 42%. This is an intermediate risk study as the patient has a known reduced EF and prior history of LAD infarction  09/2021 Cardiac MRI:  1. Mild LVE with RWMA;s consistent with prior LAD infarct. LvEF 38% Similar to EF noted on MRI 12/01/20 which was 39% 2. Delayed gadolinium scan notable for anterior wall infarct similar in appearance to prior MRI 3.  Normal RV size and function 4.  Normal cardiac valves 5.  No pericardial effusion 6.  Normal ascending thoracic aorta 3.0 cm  '22 ECHO:  EF 35-40%. The LV has moderately decreased function, regional wall motion abnormalities with mid anteroseptal and mid inferoseptal severe hypokinesis. Apical septal, apical inferior, apical anterior, and true apex akinesis. Grade I DD, no significant valvular abnormalities   Neuro/Psych PSYCHIATRIC DISORDERS Anxiety Depression negative neurological ROS     GI/Hepatic Neg liver ROS, GERD  Medicated,  Endo/Other  negative endocrine ROS  Renal/GU hereditary  leiomyomatosis and renal cell cancer with bone mets   negative genitourinary   Musculoskeletal  (+) Arthritis , S/p ACDF   Abdominal   Peds  Hematology negative hematology ROS (+)   Anesthesia Other Findings EKG 04/12/2021: Sinus rhythm.  Rate 73.  CT chest abdomen pelvis 09/15/2021 (Care Everywhere): IMPRESSION:  Chest Impression:   1. Increase in size of LEFT lobe pulmonary nodule consistent  progressive pulmonary metastasis.  2. No lymphadenopathy.   Abdomen / Pelvis Impression:   1. Progression of hepatic metastasis with increase in size of  hepatic lesions as well as increase infiltrative malignant  pattern...  2. Interval increase in peritoneal nodularity along the RIGHT  pericolic gutter/ascending colon.   Cardiac MRI 10/07/2021: IMPRESSION: 1. Mild LVE with RWMA;s consistent with prior LAD infarct. LvEF 38% Similar to EF noted on MRI 12/01/20 which was 39%  2. Delayed gadolinium scan notable for anterior wall infarct similar in appearance to prior MRI  3. Normal RV size and function  4. Normal cardiac valves  5. No pericardial effusion  6. Normal ascending thoracic aorta 3.0 cm  Nuclear stress 04/21/2021: 1. There is a fixed, large (15-20% of the LV), severe perfusion defect present in the mid septum, apical septum into the apex consistent with LAD infarction.  2. No ischemia is present.  3. Moderately reduced LVEF, 42%. 4. This is an intermediate risk study as the patient has a known reduced EF and prior history of LAD infarction.   TTE 10/04/2020: 1. Left ventricular ejection fraction, by estimation, is 35 to 40%. The  left ventricle has moderately decreased function. The left ventricle  demonstrates  regional wall motion abnormalities with mid anteroseptal and  mid inferoseptal severe hypokinesis.  Apical septal, apical inferior, apical anterior, and true apex akinesis.  Left ventricular diastolic parameters are consistent with Grade I   diastolic dysfunction (impaired relaxation).  2. Right ventricular systolic function is normal. The right ventricular  size is normal. Tricuspid regurgitation signal is inadequate for assessing  PA pressure.  3. The mitral valve is normal in structure. Trivial mitral valve  regurgitation. No evidence of mitral stenosis.  4. The aortic valve is tricuspid. Aortic valve regurgitation is trivial.  No aortic stenosis is present.  5. The inferior vena cava is normal in size with greater than 50%  respiratory variability, suggesting right atrial pressure of 3 mmHg.   Reproductive/Obstetrics                          Anesthesia Physical Anesthesia Plan  ASA: 3  Anesthesia Plan: General   Post-op Pain Management: Tylenol PO (pre-op)* and Regional block*   Induction: Intravenous  PONV Risk Score and Plan: 2 and Ondansetron, Dexamethasone and Midazolam  Airway Management Planned: LMA  Additional Equipment: None  Intra-op Plan:   Post-operative Plan: Extubation in OR  Informed Consent: I have reviewed the patients History and Physical, chart, labs and discussed the procedure including the risks, benefits and alternatives for the proposed anesthesia with the patient or authorized representative who has indicated his/her understanding and acceptance.     Dental advisory given  Plan Discussed with: CRNA and Surgeon  Anesthesia Plan Comments: (GA with TAP block for post op analgesia  )      Anesthesia Quick Evaluation

## 2021-12-27 NOTE — H&P (Signed)
REFERRING PHYSICIAN: Copland, Janese Banks*  PROVIDER: Beverlee Nims, MD  MRN: [id number] DOB: [birth year] DATE OF ENCOUNTER: 12/13/2021 Subjective   Chief Complaint: New Consultation (Ing. Hernia )   History of Present Illness: Douglas [name] is a 66 y.o. male who is seen today as an office consultation for evaluation of New Consultation ([place] ) .   This is a very 66 year old gentleman who was referred here for a symptomatic left inguinal hernia. 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 [facility] and has intermittently been on chemotherapy. His chemotherapy is currently on hold. He noticed the painful inguinal hernia more in a month ago. He describes a burning discomfort. He reports the hernia will reduce when he lays flat and he has been wearing a hernia belt. He is no longer on Plavix. He reports he can reduce the hernia when he lays down. His issue will be when he is going to be resuming his chemotherapy for his metastatic disease. He has had no nausea, vomiting, or obstructive symptoms. He has had extensive surgery in the past on his abdomen including partial nephrectomy and colectomy. He also has a difficult cardiac history with a history of MI and CHF and is being followed closely by cardiology.  He has had an open right inguinal hernia repair with mesh in 2004  Review of Systems: A complete review of systems was obtained from the patient. I have reviewed this information and discussed as appropriate with the patient. See HPI as well for other ROS.  ROS   Medical History: Past Medical History:  Diagnosis Date  Anxiety  Arthritis  Chronic kidney disease  COPD (chronic obstructive pulmonary disease) (CMS-HCC)  GERD (gastroesophageal reflux disease)  History of cancer  Hyperlipidemia  Hypertension   Patient Active Problem List   Diagnosis  Acute ST elevation myocardial infarction (STEMI) (CMS-HCC)  Allergic rhinitis  Bone lesion  CAD (coronary artery disease)  Chronic obstructive pulmonary disease, unspecified (CMS-HCC)  Dermatitis  Encounter for monitoring denosumab therapy  GERD (gastroesophageal reflux disease)  Hyperlipidemia  Hypertension  High risk medication use  Malignant neoplasm metastatic to bone (CMS-HCC)  NSTEMI (non-ST elevated myocardial infarction) (CMS-HCC)  Osteoarthritis  Protein-calorie malnutrition, severe (CMS-HCC)  Renal calculi  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)  Renal cell carcinoma (CMS-HCC)  Tobacco use disorder  Vitamin D deficiency   Past Surgical History:  Procedure Laterality Date  Right Hip Surgery 1992  Back Fusion 1992  C4, C5, C6  Hand Surgery Right 2002  HERNIA REPAIR Right 2004  APPENDECTOMY 2015  CHOLECYSTECTOMY 2015  COLON SURGERY 2015  Kidney Surgery Right  2013, 2015  Knee Surgery Bilateral  Right 1974, Left 1975    Allergies  Allergen Reactions  Moxifloxacin Hives and Swelling  Adhesive Tape-Silicones Dermatitis and Rash   Current Outpatient Medications on File Prior to Visit  Medication Sig Dispense Refill  ascorbic acid, vitamin C, (VITAMIN C) 500 MG tablet Take by mouth  aspirin 81 MG EC tablet Take by mouth  cabozantinib (CABOMETYX) 40 mg tablet Take 40 mg by mouth once daily  calcium carbonate (TUMS) 200 mg calcium (500 mg) chewable tablet Take by mouth  carvediloL (COREG) 3.125 MG tablet Take by mouth  cetirizine (ZYRTEC) 5 MG tablet Take 5 mg by mouth once daily  cholecalciferol (VITAMIN D3) 1000 unit tablet Take by mouth  denosumab (XGEVA) 120 mg/1.7  mL (70 mg/mL) injection Inject subcutaneously  DULoxetine (CYMBALTA) 20 MG DR capsule Take 1 capsule by mouth once daily  HYDROcodone-acetaminophen (NORCO) 7.5-325 mg tablet Take by mouth  loperamide (IMODIUM A-D) 2 mg tablet Take by mouth  LORazepam (ATIVAN) 1 MG  tablet Take by mouth  multivitamin tablet Take 1 tablet by mouth once daily  nitroGLYcerin (NITROSTAT) 0.4 MG SL tablet  ondansetron (ZOFRAN) 4 MG tablet Take 4 mg by mouth every 8 (eight) hours as needed for Nausea  pantoprazole (PROTONIX) 20 MG DR tablet Take 1 tablet by mouth once daily  pseudoephedrine (SUDAFED) 30 mg tablet Take by mouth  rosuvastatin (CRESTOR) 10 MG tablet Take 1 tablet by mouth once daily  traZODone (DESYREL) 50 MG tablet Take by mouth  VITAMIN B COMPLEX ORAL Take 1 tablet by mouth once daily   No current facility-administered medications on file prior to visit.   History reviewed. No pertinent family history.   Social History   Tobacco Use  Smoking Status Former  Types: Cigarettes  Quit date: 09/2015  Years since quitting: 6.2  Smokeless Tobacco Never    Social History   Socioeconomic History  Marital status: Married  Tobacco Use  Smoking status: Former  Types: Cigarettes  Quit date: 09/2015  Years since quitting: 6.2  Smokeless tobacco: Never  Substance and Sexual Activity  Alcohol use: Not Currently  Drug use: Yes  Types: Marijuana   Objective:   Vitals:  12/13/21 0942  BP: 122/84  Pulse: 103  Temp: 36.7 C (98.1 F)  SpO2: 98%  Weight: 71.4 kg (157 lb 6.4 oz)  Height: 185.4 cm (_0 )   Body mass index is 20.77 kg/m.  Physical Exam   He is awake and alert on exam  Abdomen is soft. There is a reducible hernia just to the right of midline above the umbilicus. It is nontender.  He has a small to moderate-sized, easily reducible tender left inguinal hernia. There is no evidence of right inguinal hernia.  Labs, Imaging and Diagnostic Testing: I have reviewed his laboratory data and notes in the electronic medical records  Assessment and Plan:   Diagnoses and all orders for this visit:  Left inguinal hernia    At this point we discussed the hernia in detail. We discussed continued conservative management versus surgical  repair. We discussed the risks of incarceration and strangulation without surgery. He is currently not on any blood thinning medication and is off of chemotherapy so if we decide to proceed with surgery we will want to do this is soon as possible after cardiac clearance. We discussed signs and symptoms of incarceration should he decide to hold on surgery. After thorough discussion we will only start plans to operate as soon as possible. This would be with an open left inguinal hernia pair with mesh and a tap block or we can try to limit anesthesia to an LMA or potentially even MAC. We discussed the risk of surgery which includes but is not limited to bleeding, infection, nerve entrapment, chronic pain, hernia recurrence, cardiac issues, etc. He understands and agrees with the plans.

## 2021-12-27 NOTE — Progress Notes (Signed)
Anesthesia Chart Review:  History of hereditary leiomyomatosis and renal cell cancer with bone mets followed at both [facility].  Last seen by Dr. Wendee Beavers at [facility] on 12/01/2021.  Per note, "Leiomyomatosis with renal cell cancer - Patient completed Day 1, Cycle 25 of Opdivo on 09/06/2021. Holding off treatment until I get IPI approved so we can do Opdivo and IPI together. He was hesitant on taking IPI and Opdive together so I will try Cabozantinib and see how he does on it. Patient will stop his therapy due to all the complications making him weaker. We will try switching him to another drug. I will give him a week off and start him again on Cabozantinib. Our options are to do 40 of Cabozantinib or try 20 then increase dose to 40. RTC 3 weeks."  He is also maintained on Xgeva for bone mets.  Follows with cardiology at [facility] for history of CAD s/p STEMI December 2021 treated with DES to proximal LAD with residual 50% ramus.  EF at that time was 35 to 40%.  Most recent cardiac MRI 09/2021 showed EF 38%, RWMA consistent with prior LAD infarct.  He was seen by Dr. Radford Pax 04/12/21 for atypical chest pain and underwent Myoview which showed no ischemia, LAD infarct, EF 42%.  Last seen by [name] 07/29/2021, stable at time, no changes made to management.  Recommended 88-monthfollow-up.  Cardiac clearance per telephone encounter 12/06/2021 by AFabian Sharp PA-C states, "Chart reviewed as part of pre-operative protocol coverage. Per Dr. NJohnsie Cancel He had a non ischemic myovue in 04/2021 moderate risk ok to proceed with LMA or sedation. Prefer to keep on 81 mg ASA. "  [place].  EKG 04/12/2021: Sinus rhythm.  Rate 73.  CT chest abdomen pelvis 09/15/2021 (Care Everywhere): IMPRESSION:  Chest Impression:   1. Increase in size of LEFT lobe pulmonary nodule consistent  progressive pulmonary metastasis.  2. No lymphadenopathy.    Abdomen / Pelvis Impression:   1. Progression of hepatic metastasis with increase in size of  hepatic lesions as well as increase infiltrative malignant  pattern...  2. Interval increase in peritoneal nodularity along the RIGHT  pericolic gutter/ascending colon.    Cardiac MRI 10/07/2021: IMPRESSION: 1. Mild LVE with RWMA;s consistent with prior LAD infarct. LvEF 38% Similar to EF noted on MRI 12/01/20 which was 39%   2. Delayed gadolinium scan notable for anterior wall infarct similar in appearance to prior MRI   3.  Normal RV size and function   4.  Normal cardiac valves   5.  No pericardial effusion   6.  Normal ascending thoracic aorta 3.0 cm  Nuclear stress 04/21/2021: There is a fixed, large (15-20% of the LV), severe perfusion defect present in the mid septum, apical septum into the apex consistent with LAD infarction.  No ischemia is present.  Moderately reduced LVEF, 42%. This is an intermediate risk study as the patient has a known reduced EF and prior history of LAD infarction.   TTE 10/04/2020:  1. Left ventricular ejection fraction, by estimation, is 35 to 40%. The  left ventricle has moderately decreased function. The left ventricle  demonstrates regional wall motion abnormalities with mid anteroseptal and  mid inferoseptal severe hypokinesis.  Apical septal, apical inferior, apical anterior, and true apex akinesis.  Left ventricular diastolic parameters are consistent with Grade I  diastolic dysfunction (impaired relaxation).   2. Right ventricular systolic  function is normal. The right ventricular  size is normal. Tricuspid regurgitation signal is inadequate for assessing  PA pressure.   3. The mitral valve is normal in structure. Trivial mitral valve  regurgitation. No evidence of mitral stenosis.   4. The aortic valve is tricuspid. Aortic valve regurgitation is trivial.  No aortic stenosis is present.   5. The inferior vena cava is normal in size with  greater than 50%  respiratory variability, suggesting right atrial pressure of 3 mmHg.     Wynonia Musty [facility] Short Stay Center/Anesthesiology Phone [phone] 12/27/2021 10:59 AM

## 2021-12-28 ENCOUNTER — Encounter (HOSPITAL_COMMUNITY): Payer: Self-pay | Admitting: Surgery

## 2021-12-28 ENCOUNTER — Ambulatory Visit (HOSPITAL_BASED_OUTPATIENT_CLINIC_OR_DEPARTMENT_OTHER): Payer: Medicare HMO | Admitting: Certified Registered Nurse Anesthetist

## 2021-12-28 ENCOUNTER — Ambulatory Visit (HOSPITAL_COMMUNITY): Payer: Medicare HMO | Admitting: Physician Assistant

## 2021-12-28 ENCOUNTER — Ambulatory Visit (HOSPITAL_COMMUNITY)
Admission: RE | Admit: 2021-12-28 | Discharge: 2021-12-28 | Disposition: A | Discharge status: Home / Self Care | Payer: Medicare HMO | Attending: Surgery | Admitting: Surgery

## 2021-12-28 ENCOUNTER — Encounter (HOSPITAL_COMMUNITY)
Admission: RE | Disposition: A | Discharge status: Home / Self Care | Payer: Self-pay | Source: Home / Self Care | Attending: Surgery

## 2021-12-28 ENCOUNTER — Other Ambulatory Visit: Payer: Self-pay

## 2021-12-28 DIAGNOSIS — Z87891 Personal history of nicotine dependence: Secondary | ICD-10-CM | POA: Diagnosis not present

## 2021-12-28 DIAGNOSIS — Z9221 Personal history of antineoplastic chemotherapy: Secondary | ICD-10-CM | POA: Insufficient documentation

## 2021-12-28 DIAGNOSIS — D481 Neoplasm of uncertain behavior of connective and other soft tissue: Secondary | ICD-10-CM | POA: Diagnosis not present

## 2021-12-28 DIAGNOSIS — F32A Depression, unspecified: Secondary | ICD-10-CM | POA: Diagnosis not present

## 2021-12-28 DIAGNOSIS — Z9049 Acquired absence of other specified parts of digestive tract: Secondary | ICD-10-CM | POA: Diagnosis not present

## 2021-12-28 DIAGNOSIS — F419 Anxiety disorder, unspecified: Secondary | ICD-10-CM | POA: Insufficient documentation

## 2021-12-28 DIAGNOSIS — K4090 Unilateral inguinal hernia, without obstruction or gangrene, not specified as recurrent: Secondary | ICD-10-CM | POA: Diagnosis not present

## 2021-12-28 DIAGNOSIS — K219 Gastro-esophageal reflux disease without esophagitis: Secondary | ICD-10-CM | POA: Diagnosis not present

## 2021-12-28 DIAGNOSIS — I2510 Atherosclerotic heart disease of native coronary artery without angina pectoris: Secondary | ICD-10-CM

## 2021-12-28 DIAGNOSIS — I110 Hypertensive heart disease with heart failure: Secondary | ICD-10-CM

## 2021-12-28 DIAGNOSIS — I509 Heart failure, unspecified: Secondary | ICD-10-CM | POA: Diagnosis not present

## 2021-12-28 DIAGNOSIS — I5022 Chronic systolic (congestive) heart failure: Secondary | ICD-10-CM

## 2021-12-28 DIAGNOSIS — J449 Chronic obstructive pulmonary disease, unspecified: Secondary | ICD-10-CM | POA: Insufficient documentation

## 2021-12-28 DIAGNOSIS — G8918 Other acute postprocedural pain: Secondary | ICD-10-CM | POA: Diagnosis not present

## 2021-12-28 DIAGNOSIS — I252 Old myocardial infarction: Secondary | ICD-10-CM

## 2021-12-28 DIAGNOSIS — Z905 Acquired absence of kidney: Secondary | ICD-10-CM | POA: Diagnosis not present

## 2021-12-28 HISTORY — PX: INGUINAL HERNIA REPAIR: SHX194

## 2021-12-28 HISTORY — PX: INSERTION OF MESH: SHX5868

## 2021-12-28 SURGERY — REPAIR, HERNIA, INGUINAL, ADULT
Anesthesia: General | Site: Inguinal | Laterality: Left

## 2021-12-28 MED ORDER — CHLORHEXIDINE GLUCONATE CLOTH 2 % EX PADS: 6.0000 | MEDICATED_PAD | Freq: Once | CUTANEOUS | Status: DC

## 2021-12-28 MED ORDER — ONDANSETRON HCL 4 MG/2ML IJ SOLN
INTRAMUSCULAR | Status: AC
  Filled 2021-12-28: qty 2

## 2021-12-28 MED ORDER — CHLORHEXIDINE GLUCONATE 0.12 % MT SOLN
15.0000 mL | Freq: Once | OROMUCOSAL | Status: AC
  Administered 2021-12-28: 15 mL via OROMUCOSAL
  Filled 2021-12-28: qty 15

## 2021-12-28 MED ORDER — MIDAZOLAM HCL 2 MG/2ML IJ SOLN
INTRAMUSCULAR | Status: AC
  Filled 2021-12-28: qty 2

## 2021-12-28 MED ORDER — DEXAMETHASONE SODIUM PHOSPHATE 10 MG/ML IJ SOLN
INTRAMUSCULAR | Status: AC
  Filled 2021-12-28: qty 1

## 2021-12-28 MED ORDER — PROPOFOL 10 MG/ML IV BOLUS
INTRAVENOUS | Status: DC | PRN
  Administered 2021-12-28: 200 mg via INTRAVENOUS

## 2021-12-28 MED ORDER — PHENYLEPHRINE HCL-NACL 20-0.9 MG/250ML-% IV SOLN
INTRAVENOUS | Status: DC | PRN
  Administered 2021-12-28: 50 ug/min via INTRAVENOUS

## 2021-12-28 MED ORDER — ACETAMINOPHEN 500 MG PO TABS
1000.0000 mg | ORAL_TABLET | Freq: Once | ORAL | Status: AC
  Administered 2021-12-28: 1000 mg via ORAL
  Filled 2021-12-28: qty 2

## 2021-12-28 MED ORDER — AMISULPRIDE (ANTIEMETIC) 5 MG/2ML IV SOLN
5.0000 mg | Freq: Once | INTRAVENOUS | Status: AC
  Administered 2021-12-28: 5 mg via INTRAVENOUS

## 2021-12-28 MED ORDER — BUPIVACAINE-EPINEPHRINE (PF) 0.5% -1:200000 IJ SOLN
INTRAMUSCULAR | Status: DC | PRN
  Administered 2021-12-28: 30 mL

## 2021-12-28 MED ORDER — OXYCODONE HCL 5 MG PO TABS: 5.0000 mg | ORAL_TABLET | Freq: Four times a day (QID) | ORAL | 0 refills | Status: DC | PRN

## 2021-12-28 MED ORDER — AMISULPRIDE (ANTIEMETIC) 5 MG/2ML IV SOLN
INTRAVENOUS | Status: AC
  Filled 2021-12-28: qty 2

## 2021-12-28 MED ORDER — ORAL CARE MOUTH RINSE
15.0000 mL | Freq: Once | OROMUCOSAL | Status: AC
Start: 2021-12-28 — End: 2021-12-28

## 2021-12-28 MED ORDER — FENTANYL CITRATE (PF) 250 MCG/5ML IJ SOLN
INTRAMUSCULAR | Status: DC | PRN
  Administered 2021-12-28 (×2): 50 ug via INTRAVENOUS

## 2021-12-28 MED ORDER — FENTANYL CITRATE (PF) 100 MCG/2ML IJ SOLN
25.0000 ug | INTRAMUSCULAR | Status: DC | PRN
  Administered 2021-12-28 (×2): 50 ug via INTRAVENOUS

## 2021-12-28 MED ORDER — MIDAZOLAM HCL 2 MG/2ML IJ SOLN
INTRAMUSCULAR | Status: DC | PRN
  Administered 2021-12-28: 2 mg via INTRAVENOUS

## 2021-12-28 MED ORDER — LIDOCAINE 2% (20 MG/ML) 5 ML SYRINGE
INTRAMUSCULAR | Status: AC
  Filled 2021-12-28: qty 5

## 2021-12-28 MED ORDER — FENTANYL CITRATE (PF) 100 MCG/2ML IJ SOLN
INTRAMUSCULAR | Status: AC
  Filled 2021-12-28: qty 2

## 2021-12-28 MED ORDER — BUPIVACAINE-EPINEPHRINE (PF) 0.25% -1:200000 IJ SOLN
INTRAMUSCULAR | Status: DC | PRN
  Administered 2021-12-28: 10 mL

## 2021-12-28 MED ORDER — BUPIVACAINE-EPINEPHRINE (PF) 0.25% -1:200000 IJ SOLN
INTRAMUSCULAR | Status: AC
Start: 2021-12-28 — End: ?
  Filled 2021-12-28: qty 30

## 2021-12-28 MED ORDER — FENTANYL CITRATE (PF) 250 MCG/5ML IJ SOLN
INTRAMUSCULAR | Status: AC
  Filled 2021-12-28: qty 5

## 2021-12-28 MED ORDER — ENSURE PRE-SURGERY PO LIQD: 296.0000 mL | Freq: Once | ORAL | Status: DC

## 2021-12-28 MED ORDER — CEFAZOLIN SODIUM-DEXTROSE 2-4 GM/100ML-% IV SOLN
2.0000 g | INTRAVENOUS | Status: DC
  Filled 2021-12-28: qty 100

## 2021-12-28 MED ORDER — PHENYLEPHRINE 80 MCG/ML (10ML) SYRINGE FOR IV PUSH (FOR BLOOD PRESSURE SUPPORT)
PREFILLED_SYRINGE | INTRAVENOUS | Status: DC | PRN
  Administered 2021-12-28: 80 ug via INTRAVENOUS

## 2021-12-28 MED ORDER — PROPOFOL 10 MG/ML IV BOLUS
INTRAVENOUS | Status: AC
  Filled 2021-12-28: qty 20

## 2021-12-28 MED ORDER — 0.9 % SODIUM CHLORIDE (POUR BTL) OPTIME
TOPICAL | Status: DC | PRN
  Administered 2021-12-28: 1000 mL

## 2021-12-28 MED ORDER — LIDOCAINE 2% (20 MG/ML) 5 ML SYRINGE
INTRAMUSCULAR | Status: DC | PRN
  Administered 2021-12-28: 30 mg via INTRAVENOUS

## 2021-12-28 MED ORDER — LACTATED RINGERS IV SOLN: INTRAVENOUS | Status: DC

## 2021-12-28 SURGICAL SUPPLY — 35 items
ADH SKN CLS APL DERMABOND .7 (GAUZE/BANDAGES/DRESSINGS) ×1
APL PRP STRL LF DISP 70% ISPRP (MISCELLANEOUS) ×1
BAG COUNTER SPONGE SURGICOUNT (BAG) ×1 IMPLANT
BAG SPNG CNTER NS LX DISP (BAG) ×1
BLADE CLIPPER SURG (BLADE) ×1 IMPLANT
CHLORAPREP W/TINT 26 (MISCELLANEOUS) ×2 IMPLANT
COVER SURGICAL LIGHT HANDLE (MISCELLANEOUS) ×2 IMPLANT
DERMABOND ADVANCED (GAUZE/BANDAGES/DRESSINGS) ×1
DERMABOND ADVANCED .7 DNX12 (GAUZE/BANDAGES/DRESSINGS) ×1 IMPLANT
DRAIN PENROSE 1/2X12 LTX STRL (WOUND CARE) IMPLANT
DRAPE LAPAROTOMY TRNSV 102X78 (DRAPES) ×2 IMPLANT
ELECT REM PT RETURN 9FT ADLT (ELECTROSURGICAL) ×2
ELECTRODE REM PT RTRN 9FT ADLT (ELECTROSURGICAL) ×1 IMPLANT
GLOVE SURG SIGNA 7.5 PF LTX (GLOVE) ×2 IMPLANT
GOWN STRL REUS W/ TWL LRG LVL3 (GOWN DISPOSABLE) ×1 IMPLANT
GOWN STRL REUS W/ TWL XL LVL3 (GOWN DISPOSABLE) ×1 IMPLANT
GOWN STRL REUS W/TWL LRG LVL3 (GOWN DISPOSABLE) ×2
GOWN STRL REUS W/TWL XL LVL3 (GOWN DISPOSABLE) ×4
KIT BASIN OR (CUSTOM PROCEDURE TRAY) ×2 IMPLANT
KIT TURNOVER KIT B (KITS) ×2 IMPLANT
MESH PARIETEX PROGRIP LEFT (Mesh General) ×1 IMPLANT
NDL HYPO 25GX1X1/2 BEV (NEEDLE) ×1 IMPLANT
NEEDLE HYPO 25GX1X1/2 BEV (NEEDLE) ×2 IMPLANT
NS IRRIG 1000ML POUR BTL (IV SOLUTION) ×2 IMPLANT
PACK GENERAL/GYN (CUSTOM PROCEDURE TRAY) ×2 IMPLANT
PAD ARMBOARD 7.5X6 YLW CONV (MISCELLANEOUS) ×2 IMPLANT
SUT MON AB 4-0 PC3 18 (SUTURE) ×2 IMPLANT
SUT SILK 2 0 SH (SUTURE) IMPLANT
SUT VIC AB 2-0 CT1 27 (SUTURE) ×4
SUT VIC AB 2-0 CT1 TAPERPNT 27 (SUTURE) ×1 IMPLANT
SUT VIC AB 3-0 CT1 27 (SUTURE) ×2
SUT VIC AB 3-0 CT1 TAPERPNT 27 (SUTURE) ×1 IMPLANT
SYR CONTROL 10ML LL (SYRINGE) ×2 IMPLANT
TOWEL GREEN STERILE (TOWEL DISPOSABLE) ×2 IMPLANT
TOWEL GREEN STERILE FF (TOWEL DISPOSABLE) ×2 IMPLANT

## 2021-12-28 NOTE — Interval H&P Note (Signed)
History and Physical Interval Note: no change in H and P  12/28/2021 12:35 PM  Douglas [name]  has presented today for surgery, with the diagnosis of LEFT INGUINAL HERNIA.  The various methods of treatment have been discussed with the patient and family. After consideration of risks, benefits and other options for treatment, the patient has consented to  Procedure(s): OPEN LEFT INGUINAL HERNIA REPAIR WITH MESH (Left) as a surgical intervention.  The patient's history has been reviewed, patient examined, no change in status, stable for surgery.  I have reviewed the patient's chart and labs.  Questions were answered to the patient's satisfaction.     Coralie Keens

## 2021-12-28 NOTE — Anesthesia Procedure Notes (Signed)
Anesthesia Regional Block: TAP block   Pre-Anesthetic Checklist: , timeout performed,  Correct Patient, Correct Site, Correct Laterality,  Correct Procedure, Correct Position, site marked,  Risks and benefits discussed,  Surgical consent,  Pre-op evaluation,  At surgeon's request and post-op pain management  Laterality: Left  Prep: chloraprep       Needles:  Injection technique: Single-shot  Needle Type: Echogenic Needle     Needle Length: 9cm  Needle Gauge: 21     Additional Needles:   Procedures:,,,, ultrasound used (permanent image in chart),,    Narrative:  Start time: 12/28/2021 1:32 PM End time: 12/28/2021 1:40 PM Injection made incrementally with aspirations every 5 mL.  Performed by: Personally  Anesthesiologist: Annye Asa, MD  Additional Notes: Pt identified in Holding room.  Monitors applied. Working IV access confirmed. Sterile prepL abdomen/flank.  #21ga ECHOgenic Arrow block needle into TAP with US guidance.  30cc 0.5% Bupivacaine 1:200k epi injected incrementally after negative test dose.  Patient asymptomatic, VSS, no heme aspirated, tolerated well.   Jenita Seashore, MD

## 2021-12-28 NOTE — Anesthesia Postprocedure Evaluation (Signed)
Anesthesia Post Note  Patient: Douglas [name]  Procedure(s) Performed: OPEN LEFT INGUINAL HERNIA REPAIR (Left: Inguinal) INSERTION OF MESH (Left: Inguinal)     Patient location during evaluation: PACU Anesthesia Type: General Level of consciousness: awake and alert, patient cooperative and oriented Pain management: pain level controlled Vital Signs Assessment: post-procedure vital signs reviewed and stable Respiratory status: spontaneous breathing, nonlabored ventilation and respiratory function stable Cardiovascular status: blood pressure returned to baseline and stable Postop Assessment: no apparent nausea or vomiting and able to ambulate Anesthetic complications: no   No notable events documented.  Last Vitals:  Vitals:   12/28/21 1525 12/28/21 1540  BP: 135/89 (!) 138/95  Pulse: 66 68  Resp: 19 15  Temp:  (!) 36.4 C  SpO2: 96% 96%    Last Pain:  Vitals:   12/28/21 1540  TempSrc:   PainSc: 0-No pain                 Loralyn Rachel,E. Lamaya Hyneman

## 2021-12-28 NOTE — Discharge Instructions (Signed)
[facility] _______[facility]  UMBILICAL OR INGUINAL HERNIA REPAIR: POST OP INSTRUCTIONS  Always review your discharge instruction sheet given to you by the facility where your surgery was performed. IF YOU HAVE DISABILITY OR FAMILY LEAVE FORMS, YOU MUST BRING THEM TO THE OFFICE FOR PROCESSING.   DO NOT GIVE THEM TO YOUR DOCTOR.  1. A  prescription for pain medication may be given to you upon discharge.  Take your pain medication as prescribed, if needed.  If narcotic pain medicine is not needed, then you may take acetaminophen (Tylenol) or ibuprofen (Advil) as needed. 2. Take your usually prescribed medications unless otherwise directed. If you need a refill on your pain medication, please contact your pharmacy.  They will contact our office to request authorization. Prescriptions will not be filled after 5 pm or on week-ends. 3. You should follow a light diet the first 24 hours after arrival home, such as soup and crackers, etc.  Be sure to include lots of fluids daily.  Resume your normal diet the day after surgery. 4.Most patients will experience some swelling and bruising around the umbilicus or in the groin and scrotum.  Ice packs and reclining will help.  Swelling and bruising can take several days to resolve.  6. It is common to experience some constipation if taking pain medication after surgery.  Increasing fluid intake and taking a stool softener (such as Colace) will usually help or prevent this problem from occurring.  A mild laxative (Milk of Magnesia or Miralax) should be taken according to package directions if there are no bowel movements after 48 hours. 7. Unless discharge instructions indicate otherwise, you may remove your bandages 24-48 hours after surgery, and you may shower at that time.  You may have steri-strips (small skin tapes) in place directly over the incision.  These strips should be left on the skin for 7-10 days.  If your surgeon used skin glue on the  incision, you may shower in 24 hours.  The glue will flake off over the next 2-3 weeks.  Any sutures or staples will be removed at the office during your follow-up visit. 8. ACTIVITIES:  You may resume regular (light) daily activities beginning the next day--such as daily self-care, walking, climbing stairs--gradually increasing activities as tolerated.  You may have sexual intercourse when it is comfortable.  Refrain from any heavy lifting or straining until approved by your doctor.  a.You may drive when you are no longer taking prescription pain medication, you can comfortably wear a seatbelt, and you can safely maneuver your car and apply brakes. b.RETURN TO WORK:   _____________________________________________  9.You should see your doctor in the office for a follow-up appointment approximately 2-3 weeks after your surgery.  Make sure that you call for this appointment within a day or two after you arrive home to insure a convenient appointment time. 10.OTHER INSTRUCTIONS: _OK TO SHOWER STARTING TOMORROW NO LIFTING MORE THAN 15 TO 20 POUNDS FOR 4 WEEKS ICE PACK AND EXTRA STRENGTH TYLENOL ALSO FOR PAIN ________________________    _____________________________________  WHEN TO CALL YOUR DOCTOR: Fever over 101.0 Inability to urinate Nausea and/or vomiting Extreme swelling or bruising Continued bleeding from incision. Increased pain, redness, or drainage from the incision  The clinic staff is available to answer your questions during regular business hours.  Please don't hesitate to call and ask to speak to one of the nurses for clinical concerns.  If you have a medical emergency, go to the nearest emergency room  or call 911.  A surgeon from [facility] is always on call at the hospital   [address] [place], [place], [place]  [address] ?  [address], [place], [place]   [address] [phone] ? [phone] ? FAX [phone] Web site:  [email or web address]

## 2021-12-28 NOTE — Op Note (Signed)
OPEN LEFT INGUINAL HERNIA REPAIR, INSERTION OF MESH  Procedure Note  Douglas [name] 12/28/2021   Pre-op Diagnosis: LEFT INGUINAL HERNIA     Post-op Diagnosis: same  Procedure(s): OPEN LEFT INGUINAL HERNIA REPAIR INSERTION OF MESH  Surgeon(s): Coralie Keens, MD  Assistant: Dr. Clerance Lav, [facility] Resident Anesthesia: General  Staff:  Circulator: Margy Clarks, RN Scrub Person: Rico Ala  Estimated Blood Loss: Minimal               Findings: The patient was found to have an indirect left inguinal hernia which was repaired with a large piece of Prolene ProGrip mesh from [employer]  Procedure: The patient is brought to operating identifies correct patient.  He was placed upon the operating table general anesthesia was induced.  He had already received a left tap block by anesthesiology.  His left lower quadrant and inguinal area were then prepped and draped in usual sterile fashion.  We anesthetized skin with Marcaine and then made a longitudinal incision with a scalpel.  We then dissected down through Scarpa's fascia with electrocautery.  The external bleak fascia was then identified and opened toward the internal and external rings.  The testicular cord and structures were then controlled with a Penrose drain.  The patient had an indirect hernia sac.  It was easily separated from the cord structures and dissected down to its base.  All contents had already been reduced.  The base of the sac was tied off with a 2-0 silk suture in the redundant sac was then excised with the cautery.  Next a large piece of Prolene ProGrip mesh was brought onto the field.  It was placed up against the pubic tubercle and then brought around the cord structures and tucked laterally underneath the external bleak fascia.  The mesh was then sutured in place with a 2-0 Vicryl suture to the pubic tubercle.  Wide coverage of the inguinal floor and internal ring appeared to be achieved.  1 suture was  placed in the internal ring to reinforce the mesh around the ring.  At this point hemostasis peer to be achieved.  The external bleak fascia was closed over the top of the mesh with a running 2-0 Vicryl suture.  Scarpa's fascia was then closed interrupted 3-0 Vicryl sutures and the skin was closed with running 4-0 Monocryl.  Dermabond was then applied.  The patient tolerated the procedure well.  All the counts were correct at the end of the procedure.  The patient was then extubated in the operating room and taken in a stable condition to the recovery room.          Coralie Keens   Date: 12/28/2021  Time: 2:57 PM

## 2021-12-28 NOTE — Transfer of Care (Signed)
Immediate Anesthesia Transfer of Care Note  Patient: Sebastiano Luecke  Procedure(s) Performed: OPEN LEFT INGUINAL HERNIA REPAIR (Left: Inguinal) INSERTION OF MESH (Left: Inguinal)  Patient Location: PACU  Anesthesia Type:GA combined with regional for post-op pain  Level of Consciousness: awake and alert   Airway & Oxygen Therapy: Patient Spontanous Breathing and Patient connected to nasal cannula oxygen  Post-op Assessment: Report given to RN and Post -op Vital signs reviewed and stable  Post vital signs: Reviewed and stable  Last Vitals:  Vitals Value Taken Time  BP 135/89 12/28/21 1515  Temp 36.4 C 12/28/21 1510  Pulse 81 12/28/21 1516  Resp 11 12/28/21 1516  SpO2 97 % 12/28/21 1516  Vitals shown include unvalidated device data.  Last Pain:  Vitals:   12/28/21 1510  TempSrc:   PainSc: Asleep      Patients Stated Pain Goal: 2 (06/00/45 9977)  Complications: No notable events documented.

## 2021-12-29 ENCOUNTER — Encounter (HOSPITAL_COMMUNITY): Payer: Self-pay | Admitting: Surgery

## 2022-01-05 DIAGNOSIS — Z1509 Genetic susceptibility to other malignant neoplasm: Secondary | ICD-10-CM | POA: Diagnosis not present

## 2022-01-05 DIAGNOSIS — C641 Malignant neoplasm of right kidney, except renal pelvis: Secondary | ICD-10-CM | POA: Diagnosis not present

## 2022-01-05 DIAGNOSIS — M25521 Pain in right elbow: Secondary | ICD-10-CM | POA: Diagnosis not present

## 2022-01-05 DIAGNOSIS — C649 Malignant neoplasm of unspecified kidney, except renal pelvis: Secondary | ICD-10-CM | POA: Diagnosis not present

## 2022-01-05 DIAGNOSIS — R002 Palpitations: Secondary | ICD-10-CM | POA: Diagnosis not present

## 2022-01-05 DIAGNOSIS — M25569 Pain in unspecified knee: Secondary | ICD-10-CM | POA: Diagnosis not present

## 2022-01-05 DIAGNOSIS — Z79620 Long term (current) use of immunosuppressive biologic: Secondary | ICD-10-CM | POA: Diagnosis not present

## 2022-01-05 DIAGNOSIS — D649 Anemia, unspecified: Secondary | ICD-10-CM | POA: Diagnosis not present

## 2022-01-05 DIAGNOSIS — M25549 Pain in joints of unspecified hand: Secondary | ICD-10-CM | POA: Diagnosis not present

## 2022-01-05 DIAGNOSIS — R519 Headache, unspecified: Secondary | ICD-10-CM | POA: Diagnosis not present

## 2022-01-05 DIAGNOSIS — K219 Gastro-esophageal reflux disease without esophagitis: Secondary | ICD-10-CM | POA: Diagnosis not present

## 2022-01-05 DIAGNOSIS — C7951 Secondary malignant neoplasm of bone: Secondary | ICD-10-CM | POA: Diagnosis not present

## 2022-01-05 DIAGNOSIS — G44209 Tension-type headache, unspecified, not intractable: Secondary | ICD-10-CM | POA: Diagnosis not present

## 2022-01-05 DIAGNOSIS — Z79891 Long term (current) use of opiate analgesic: Secondary | ICD-10-CM | POA: Diagnosis not present

## 2022-01-05 DIAGNOSIS — M2550 Pain in unspecified joint: Secondary | ICD-10-CM | POA: Diagnosis not present

## 2022-01-05 DIAGNOSIS — R42 Dizziness and giddiness: Secondary | ICD-10-CM | POA: Diagnosis not present

## 2022-01-05 DIAGNOSIS — L664 Folliculitis ulerythematosa reticulata: Secondary | ICD-10-CM | POA: Diagnosis not present

## 2022-01-11 DIAGNOSIS — Z1509 Genetic susceptibility to other malignant neoplasm: Secondary | ICD-10-CM | POA: Diagnosis not present

## 2022-01-11 DIAGNOSIS — C649 Malignant neoplasm of unspecified kidney, except renal pelvis: Secondary | ICD-10-CM | POA: Diagnosis not present

## 2022-01-11 DIAGNOSIS — R519 Headache, unspecified: Secondary | ICD-10-CM | POA: Diagnosis not present

## 2022-01-11 DIAGNOSIS — R42 Dizziness and giddiness: Secondary | ICD-10-CM | POA: Diagnosis not present

## 2022-01-11 DIAGNOSIS — R9082 White matter disease, unspecified: Secondary | ICD-10-CM | POA: Diagnosis not present

## 2022-01-20 ENCOUNTER — Encounter: Payer: Self-pay | Admitting: Family Medicine

## 2022-01-20 DIAGNOSIS — C641 Malignant neoplasm of right kidney, except renal pelvis: Secondary | ICD-10-CM

## 2022-01-20 DIAGNOSIS — G893 Neoplasm related pain (acute) (chronic): Secondary | ICD-10-CM

## 2022-01-21 MED ORDER — HYDROCODONE-ACETAMINOPHEN 7.5-325 MG PO TABS
2.0000 | ORAL_TABLET | ORAL | 0 refills | Status: DC
Start: 2022-01-21 — End: 2022-02-18

## 2022-01-21 MED ORDER — HYDROCODONE-ACETAMINOPHEN 7.5-325 MG PO TABS: 2.0000 | ORAL_TABLET | ORAL | 0 refills | Status: DC

## 2022-01-21 NOTE — Addendum Note (Signed)
Addended by: Lamar Blinks C on: 01/21/2022 06:20 PM   Modules accepted: Orders

## 2022-01-24 DIAGNOSIS — R911 Solitary pulmonary nodule: Secondary | ICD-10-CM | POA: Diagnosis not present

## 2022-01-24 DIAGNOSIS — C785 Secondary malignant neoplasm of large intestine and rectum: Secondary | ICD-10-CM | POA: Diagnosis not present

## 2022-01-24 DIAGNOSIS — J439 Emphysema, unspecified: Secondary | ICD-10-CM | POA: Diagnosis not present

## 2022-01-24 DIAGNOSIS — C649 Malignant neoplasm of unspecified kidney, except renal pelvis: Secondary | ICD-10-CM | POA: Diagnosis not present

## 2022-01-24 DIAGNOSIS — I700 Atherosclerosis of aorta: Secondary | ICD-10-CM | POA: Diagnosis not present

## 2022-01-24 DIAGNOSIS — Z1509 Genetic susceptibility to other malignant neoplasm: Secondary | ICD-10-CM | POA: Diagnosis not present

## 2022-01-24 DIAGNOSIS — K7689 Other specified diseases of liver: Secondary | ICD-10-CM | POA: Diagnosis not present

## 2022-01-24 DIAGNOSIS — R918 Other nonspecific abnormal finding of lung field: Secondary | ICD-10-CM | POA: Diagnosis not present

## 2022-01-24 DIAGNOSIS — C787 Secondary malignant neoplasm of liver and intrahepatic bile duct: Secondary | ICD-10-CM | POA: Diagnosis not present

## 2022-01-24 DIAGNOSIS — R160 Hepatomegaly, not elsewhere classified: Secondary | ICD-10-CM | POA: Diagnosis not present

## 2022-01-26 DIAGNOSIS — C649 Malignant neoplasm of unspecified kidney, except renal pelvis: Secondary | ICD-10-CM | POA: Diagnosis not present

## 2022-01-26 DIAGNOSIS — M25521 Pain in right elbow: Secondary | ICD-10-CM | POA: Diagnosis not present

## 2022-01-26 DIAGNOSIS — M25569 Pain in unspecified knee: Secondary | ICD-10-CM | POA: Diagnosis not present

## 2022-01-26 DIAGNOSIS — C7951 Secondary malignant neoplasm of bone: Secondary | ICD-10-CM | POA: Diagnosis not present

## 2022-01-26 DIAGNOSIS — M25549 Pain in joints of unspecified hand: Secondary | ICD-10-CM | POA: Diagnosis not present

## 2022-01-26 DIAGNOSIS — M2550 Pain in unspecified joint: Secondary | ICD-10-CM | POA: Diagnosis not present

## 2022-01-26 DIAGNOSIS — R002 Palpitations: Secondary | ICD-10-CM | POA: Diagnosis not present

## 2022-01-26 DIAGNOSIS — K219 Gastro-esophageal reflux disease without esophagitis: Secondary | ICD-10-CM | POA: Diagnosis not present

## 2022-01-26 DIAGNOSIS — R42 Dizziness and giddiness: Secondary | ICD-10-CM | POA: Diagnosis not present

## 2022-01-26 DIAGNOSIS — Z1509 Genetic susceptibility to other malignant neoplasm: Secondary | ICD-10-CM | POA: Diagnosis not present

## 2022-01-26 DIAGNOSIS — R5383 Other fatigue: Secondary | ICD-10-CM | POA: Diagnosis not present

## 2022-01-26 DIAGNOSIS — L739 Follicular disorder, unspecified: Secondary | ICD-10-CM | POA: Diagnosis not present

## 2022-01-26 DIAGNOSIS — Z79620 Long term (current) use of immunosuppressive biologic: Secondary | ICD-10-CM | POA: Diagnosis not present

## 2022-01-26 DIAGNOSIS — Z79899 Other long term (current) drug therapy: Secondary | ICD-10-CM | POA: Diagnosis not present

## 2022-01-26 DIAGNOSIS — D481 Neoplasm of uncertain behavior of connective and other soft tissue: Secondary | ICD-10-CM | POA: Diagnosis not present

## 2022-01-26 DIAGNOSIS — D649 Anemia, unspecified: Secondary | ICD-10-CM | POA: Diagnosis not present

## 2022-01-26 DIAGNOSIS — R410 Disorientation, unspecified: Secondary | ICD-10-CM | POA: Diagnosis not present

## 2022-01-28 ENCOUNTER — Other Ambulatory Visit: Payer: Self-pay | Admitting: Cardiovascular Disease

## 2022-01-30 ENCOUNTER — Other Ambulatory Visit: Payer: Self-pay

## 2022-01-30 DIAGNOSIS — C649 Malignant neoplasm of unspecified kidney, except renal pelvis: Secondary | ICD-10-CM | POA: Diagnosis not present

## 2022-01-30 DIAGNOSIS — M4802 Spinal stenosis, cervical region: Secondary | ICD-10-CM | POA: Diagnosis not present

## 2022-01-30 DIAGNOSIS — C7951 Secondary malignant neoplasm of bone: Secondary | ICD-10-CM | POA: Diagnosis not present

## 2022-01-30 DIAGNOSIS — C7900 Secondary malignant neoplasm of unspecified kidney and renal pelvis: Secondary | ICD-10-CM | POA: Diagnosis not present

## 2022-02-01 DIAGNOSIS — Z85828 Personal history of other malignant neoplasm of skin: Secondary | ICD-10-CM | POA: Diagnosis not present

## 2022-02-01 DIAGNOSIS — D692 Other nonthrombocytopenic purpura: Secondary | ICD-10-CM | POA: Diagnosis not present

## 2022-02-01 DIAGNOSIS — L821 Other seborrheic keratosis: Secondary | ICD-10-CM | POA: Diagnosis not present

## 2022-02-01 DIAGNOSIS — L570 Actinic keratosis: Secondary | ICD-10-CM | POA: Diagnosis not present

## 2022-02-01 DIAGNOSIS — Z85820 Personal history of malignant melanoma of skin: Secondary | ICD-10-CM | POA: Diagnosis not present

## 2022-02-01 NOTE — Progress Notes (Signed)
CARDIOLOGY OFFICE NOTE  Date:  02/03/2022    Douglas [name] Date of Birth: April 03, 1956 Medical Record #[id number]  PCP:  Darreld Mclean, MD  Cardiologist:  Johnsie Cancel   History of Present Illness:  66 y.o. 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 Followed at [facility] for years  In clinical trials. Has been on Tarceva and Avastin .  Also has a hx of ASCAD with STEMI with DES to proximal LAD 05/24/20 And residual 50% Ramus. EF was 35-40%. Due to CAD and DAT he was taken off his Avastin and Tarceva both contraindicated with ischemic heart disease. Started on LeRoy and coreg no aldactone due to soft BP, lack of volume overload and renal dx  MRI 08/27/20 E 31% LAD scar no mural apical thrombus  Eliquis stopped  Discussed possible need for AICD in future. Needs to f/u [facility] for alternative Rx cancer in light of his CAD and moderately reduced EF  09/09/20 Dr Franki Monte [phone] called directly to discuss She is contemplating using Nivolumab as single Agent immunoRx This drug has a small risk of myocarditis but is certainly safer than avastin and tarceva  09/23/20 spoke with Dr Samul Dada at [facility] who is apparently involved now and starting Nivolumab every 2 weeks I told him we would plan on f/u Cardiac MRI June   Has now started Nivolumab/Opdivo has more congestion and MSK pains on it Noted 21-42% incidence of this side effect with drug Also more GERD. Discussed being ok to take any 2 nd generation antihistamine with sudafed  if needed   TTE reviewed 10/04/20 EF 35-40%  MRI 12/01/20 reviewed EF 39% anterior/septal/apical scar   Seen by Dr Radford Pax  04/12/21 for chest pain Atypical Myovue  04/21/21 with no ischemia LAD infarct EF 42%  Lots of joint pain from optivo Trying to get in phase one trial at [place] for new Rx since his current Rx is not working well with more abdominal /liver growth  12/28/21 Had open left inguinal hernia repair with mesh by Dr Ninfa Linden under  general anesthesia with no cardiac complications Did indicate issues with infection and use of "glue" that he is intolerant to  Has lost a lot of weight and cancer spreading Oncology trying to see if his tumor biomarkers cross react with other cancers to use alternative agents. Given situation we discussed going back to Avastin and Tarceva despite cardiac risks   Past Medical History:  Diagnosis Date   Allergic rhinitis    Anxiety    Arthritis    "all over my body" (10/16/2013)   Cataract    Chronic back pain    "neck to lower back" (10/16/2013)   Colon polyps    Compression fracture    S/P L-SPINE; pt does not recall this hx on 10/16/2013   COPD (chronic obstructive pulmonary disease) (HCC)    FVC .66   Coronary artery disease    Depression    "situational since 09/22/2013 OR"   Encounter for long-term (current) use of other medications    GERD (gastroesophageal reflux disease)    Hemoptysis    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)    History of blood transfusion 09/2013   "think so; not sure; related to big OR"   History of surgical fusion joint    DDD C-SPINE   Hx of blood clots    Hyperlipidemia    Hypertension    Hypertensive retinopathy    Leiomyoma OF SKIN   INCREASED RISK  RENAL CELL CA  NEEDS CT OF KIDNEYS EVERY 2 YEARS NEXT DUE  02/06/13   Melanoma in situ ([place])    Melanoma of back ([place])    Neuromuscular disorder ([place])    Osteoarthritis    Renal calculi    Renal cell carcinoma ([place])    type 2; papillary (metastized to bone)   Tobacco use disorder    Unspecified vitamin D deficiency     Past Surgical History:  Procedure Laterality Date   ANTERIOR CERVICAL DECOMP/DISCECTOMY FUSION  1992   ANTERIOR CRUCIATE LIGAMENT REPAIR Bilateral [phone]   APPENDECTOMY  09/22/2013   CHOLECYSTECTOMY  09/22/2013   "had tumors in it"   CORONARY STENT INTERVENTION N/A 05/24/2020   Procedure: CORONARY STENT INTERVENTION;  Surgeon: Martinique,  Walta Bellville M, MD;  Location: [facility];  Service: Cardiovascular;  Laterality: N/A;   HAND LIGAMENT RECONSTRUCTION Right    HARVEST BONE GRAFT Right 1992   "hip; for neck fusion"   INGUINAL HERNIA REPAIR Right 2004   INGUINAL HERNIA REPAIR Left 12/28/2021   Procedure: OPEN LEFT INGUINAL HERNIA REPAIR;  Surgeon: Coralie Keens, MD;  Location: [facility];  Service: General;  Laterality: Left;   INSERTION OF MESH Left 12/28/2021   Procedure: INSERTION OF MESH;  Surgeon: Coralie Keens, MD;  Location: [facility];  Service: General;  Laterality: Left;   LEFT HEART CATH AND CORONARY ANGIOGRAPHY N/A 05/24/2020   Procedure: LEFT HEART CATH AND CORONARY ANGIOGRAPHY;  Surgeon: Martinique, Angelika Jerrett M, MD;  Location: [facility];  Service: Cardiovascular;  Laterality: N/A;   LIGAMENT REPAIR Right ~ 2001   "little finger"   LYMPH NODE DISSECTION Right 09/22/2013   "in the area of the kidney; they were clean"   MELANOMA EXCISION  ~ 2007   "off my back"   PARTIAL NEPHRECTOMY Right 09/22/2013   removal of fascia overlying the right psoas Right 09/22/2013   RENAL ARTERY STENT Right 09/22/2013   RIGHT COLECTOMY Right 09/22/2013   ROBOTIC ASSITED PARTIAL NEPHRECTOMY  05/22/2012   Procedure: ROBOTIC ASSITED PARTIAL NEPHRECTOMY;  Surgeon: Alexis Frock, MD;  Location: [facility];  Service: Urology;  Laterality: Right;  Right Robotic Cyst Decortication and Partial Nephrectomy      Medications: Current Meds  Medication Sig   aspirin EC 81 MG tablet Take 1 tablet (81 mg total) by mouth daily. Swallow whole.   B Complex Vitamins (B COMPLEX 100 PO) Take 100 mg by mouth daily.   Calcium Carb-Cholecalciferol (CALCIUM 600/VITAMIN D PO) Take 1 capsule by mouth daily.   carvedilol (COREG) 3.125 MG tablet TAKE 1 TABLET BY MOUTH TWICE A DAY WITH A MEAL   cetirizine (ZYRTEC) 10 MG tablet Take 10 mg by mouth daily.   cholecalciferol (VITAMIN D3) 25 MCG (1000 UNIT) tablet Take 2,000 Units by mouth daily.   denosumab  (XGEVA) 120 MG/1.7ML SOLN injection Inject 120 mg into the skin every 30 (thirty) days.    DULoxetine (CYMBALTA) 20 MG capsule TAKE ONE CAPSULE BY MOUTH DAILY   everolimus (AFINITOR) 10 MG tablet Take 1 tablet by mouth daily.   HYDROcodone-acetaminophen (NORCO) 7.5-325 MG tablet Take 2 tablets by mouth See admin instructions. Take 2 every 4 hours through out the day until taking ativan at bedtime, do not mix   loperamide (IMODIUM A-D) 2 MG tablet Take 2 mg by mouth 4 (four) times daily as needed for diarrhea or loose stools.   LORazepam (ATIVAN) 1 MG tablet Take 0.5-1 tablets (0.5-1 mg total)  by mouth at bedtime as needed for sleep. TAKE ONE HALF OR ONE TABLET BY MOUTH AT BEDTIME IF NEEDED FOR SLEEP, DO NOT TAKE WITH PAIN MEDS   Multiple Vitamins-Minerals (CENTRUM SILVER 50+MEN PO) Take 1 tablet by mouth daily.   naloxone (NARCAN) nasal spray 4 mg/0.1 mL Use one spray in nostril every 2-3 minutes as needed for drug overdose (cancer patient on opioid therapy)   nitroGLYCERIN (NITROSTAT) 0.4 MG SL tablet Place 1 tablet (0.4 mg total) under the tongue every 5 (five) minutes x 3 doses as needed for chest pain. Please dispense #25 x 4 bottle   ondansetron (ZOFRAN-ODT) 4 MG disintegrating tablet TAKE 1 TABLET BY MOUTH SUBLINGUALLY EVERY 8 HOURS AS NEEDED FOR NAUSEA OR VOMITING   pantoprazole (PROTONIX) 20 MG tablet TAKE 1 TABLET BY MOUTH EVERY DAY   Polyethyl Glycol-Propyl Glycol (SYSTANE OP) Place 1 drop into both eyes 2 (two) times daily as needed (dry eyes).   pseudoephedrine (SUDAFED) 30 MG tablet Take 30 mg by mouth in the morning.   rosuvastatin (CRESTOR) 10 MG tablet TAKE 1 TABLET BY MOUTH EVERY DAY   sacubitril-valsartan (ENTRESTO) 24-26 MG Take 1 tablet by mouth 2 (two) times daily.   sodium bicarbonate 650 MG tablet Take 650 mg by mouth daily.   sodium chloride (OCEAN) 0.65 % SOLN nasal spray Place 1 spray into both nostrils at bedtime.   traZODone (DESYREL) 50 MG tablet Take 25 mg by mouth at  bedtime.   vitamin C (ASCORBIC ACID) 500 MG tablet Take 500 mg by mouth daily.   [DISCONTINUED] everolimus (AFINITOR) 10 MG tablet Take 10 mg by mouth daily.     Allergies: Allergies  Allergen Reactions   Avelox [Moxifloxacin Hcl In Nacl] Hives, Shortness Of Breath and Swelling   Enbucrilate Hives and Rash    DERMABOND   Tape Dermatitis and Rash    Just the glue    Social History: The patient  reports that he quit smoking about 6 years ago. His smoking use included cigarettes. He has a 20.00 pack-year smoking history. He has never used smokeless tobacco. He reports that he does not currently use alcohol. He reports current drug use. Drug: Marijuana.   Family History: The patient's family history includes Blindness in his mother; Glaucoma in his mother; Hypertension in his father. He was adopted.   Review of Systems: Please see the history of present illness.   All other systems are reviewed and negative.   Physical Exam: VS:  BP 112/70   Pulse 80   Ht '6\' 1"'$  (1.854 m)   Wt 156 lb (70.8 kg)   SpO2 96%   BMI 20.58 kg/m  .  BMI Body mass index is 20.58 kg/m.  Wt Readings from Last 3 Encounters:  02/03/22 156 lb (70.8 kg)  12/28/21 151 lb (68.5 kg)  12/26/21 157 lb 11.2 oz (71.5 kg)   Affect appropriate Healthy:  appears stated age HEENT: normal Neck supple with no adenopathy JVP normal no bruits no thyromegaly Lungs clear with no wheezing and good diaphragmatic motion Heart:  S1/S2 no murmur, no rub, gallop or click PMI normal Abdomen: benighn, BS positve, no tenderness, no AAA no bruit.  No HSM or HJR post left inguinal hernia repair  Distal pulses intact with no bruits No edema Neuro non-focal Skin warm and dry No muscular weakness  LABORATORY DATA:  EKG:   performed today and showed NSR with no ST changes. 02/03/2022 SR rate 70 old anterolateral MI   Lab  Results  Component Value Date   WBC 6.9 08/26/2020   HGB 12.4 (L) 08/26/2020   HCT 36.8 (L) 08/26/2020    PLT 205 08/26/2020   GLUCOSE 99 04/12/2021   CHOL 146 06/02/2020   TRIG 244 (H) 06/02/2020   HDL 50 06/02/2020   LDLDIRECT 63.0 05/01/2019   LDLCALC 57 06/02/2020   ALT 33 08/23/2020   AST 31 08/23/2020   NA 140 04/12/2021   K 4.8 04/12/2021   CL 104 04/12/2021   CREATININE 1.47 (H) 04/12/2021   BUN 25 04/12/2021   CO2 22 04/12/2021   TSH 1.376 05/24/2020   PSA 1.86 04/14/2020   INR 1.0 05/24/2020   HGBA1C 5.2 05/29/2016     BNP (last 3 results) No results for input(s): "BNP" in the last 8760 hours.  ProBNP (last 3 results) No results for input(s): "PROBNP" in the last 8760 hours.   Other Studies Reviewed Today:  Cardiac cath 05/24/20  Prox LAD lesion is 90% stenosed. Ramus lesion is 50% stenosed. Mid RCA lesion is 35% stenosed. Post intervention, there is a 0% residual stenosis. A drug-eluting stent was successfully placed using a STENT RESOLUTE ONYX 3.0X22. LV end diastolic pressure is normal.   1. Single vessel occlusive CAD involving the proximal LAD 2. Normal LVEDP 3. Successful PCI of the proximal LAD with DES x 1   Plan: DAPT for one year. Risk factor modification. Will assess LV function with Echo.    Echo Impression 05/25/20   1. Left ventricular ejection fraction, by estimation, is 35 to 40%. The  left ventricle has moderately decreased function. The left ventricle has  no regional wall motion abnormalities. Left ventricular diastolic  parameters are consistent with Grade I  diastolic dysfunction (impaired relaxation). There is mild apical  dyskinesis. There is severe hypokinesis of the entire anterior septum, but  the basal and mid segments of the anterior wall have preserved  contractility.   2. Right ventricular systolic function is normal. The right ventricular  size is normal.   3. The mitral valve is normal in structure. No evidence of mitral valve  regurgitation. No evidence of mitral stenosis.   4. The aortic valve is normal in structure.  Aortic valve regurgitation is  not visualized. No aortic stenosis is present.   5. The inferior vena cava is normal in size with greater than 50%  respiratory variability, suggesting right atrial pressure of 3 mmHg.    Assessment/Plan: 1. STEMI -  05/24/20 with one vessel LAD CAD - s/p PCI  - ok to rx with just ASA at this time  - myovue no ischemia  04/21/21    2. ICM  - chronic systolic HF  -Continue prescription drug management with Entresto 24-26 mg twice daily, carvedilol 3.125 mg twice daily  -EF 39% by MRI 12/01/20 with no evidence Of myocarditis outside anterior infarcted region  -he does not appear volume overloaded on exam today - EF 42% by [facility] 04/21/21  - Per Dr Quentin Ore deferred AICD consideration given cancer   3. HLD  - he is on crestor LDL at goal   4. Elevated LFTs  - resolved normal 06/02/20 likely related to acute MI  5. Hereditary leiomyomatosis and renal cell cancer with mets  - Seeing Dr Samul Dada at [facility] started on Nivolumab every 2 weeks - Main cardiac side effects myocarditis Derred surveillance colonoscopy with Dr Havery Moros history of polyps  Has had significant bone and liver mets in past and current Rx not working as  well Contacted [facility] to see if he can get in phase one trial or biomarkers for alternative Rx If not would be reasonable to go back to Traceva and Avastin despite cardiac risks as he is slowly dying of his spreading cancer with weight loss , poor functional status and spreading mets on imaging studies   Disposition:   FU 6 months   Time:  spent reviewing chart MRI/myovue discussing cancer Rx and [facility] referral patient interview and composing note 60 minutes    Signed: Jenkins Rouge, MD  02/03/2022 10:23 AM  [facility] [address] [place], [place]  [address] Phone: [phone] Fax: [phone]

## 2022-02-02 DIAGNOSIS — C649 Malignant neoplasm of unspecified kidney, except renal pelvis: Secondary | ICD-10-CM | POA: Diagnosis not present

## 2022-02-03 ENCOUNTER — Ambulatory Visit (INDEPENDENT_AMBULATORY_CARE_PROVIDER_SITE_OTHER): Payer: Medicare HMO | Admitting: Cardiovascular Disease

## 2022-02-03 ENCOUNTER — Encounter: Payer: Self-pay | Admitting: Cardiovascular Disease

## 2022-02-03 VITALS — BP 112/70 | HR 80 | Ht 73.0 in | Wt 156.0 lb

## 2022-02-03 DIAGNOSIS — D481 Neoplasm of uncertain behavior of connective and other soft tissue: Secondary | ICD-10-CM | POA: Diagnosis not present

## 2022-02-03 DIAGNOSIS — E782 Mixed hyperlipidemia: Secondary | ICD-10-CM | POA: Diagnosis not present

## 2022-02-03 DIAGNOSIS — I420 Dilated cardiomyopathy: Secondary | ICD-10-CM

## 2022-02-03 DIAGNOSIS — I2510 Atherosclerotic heart disease of native coronary artery without angina pectoris: Secondary | ICD-10-CM

## 2022-02-03 NOTE — Patient Instructions (Addendum)

## 2022-02-13 DIAGNOSIS — H33322 Round hole, left eye: Secondary | ICD-10-CM | POA: Diagnosis not present

## 2022-02-13 DIAGNOSIS — H43813 Vitreous degeneration, bilateral: Secondary | ICD-10-CM | POA: Diagnosis not present

## 2022-02-13 DIAGNOSIS — H2513 Age-related nuclear cataract, bilateral: Secondary | ICD-10-CM | POA: Diagnosis not present

## 2022-02-13 DIAGNOSIS — H35372 Puckering of macula, left eye: Secondary | ICD-10-CM | POA: Diagnosis not present

## 2022-02-13 DIAGNOSIS — H35412 Lattice degeneration of retina, left eye: Secondary | ICD-10-CM | POA: Diagnosis not present

## 2022-02-13 DIAGNOSIS — H35033 Hypertensive retinopathy, bilateral: Secondary | ICD-10-CM | POA: Diagnosis not present

## 2022-02-16 DIAGNOSIS — K219 Gastro-esophageal reflux disease without esophagitis: Secondary | ICD-10-CM | POA: Diagnosis not present

## 2022-02-16 DIAGNOSIS — R059 Cough, unspecified: Secondary | ICD-10-CM | POA: Diagnosis not present

## 2022-02-16 DIAGNOSIS — R634 Abnormal weight loss: Secondary | ICD-10-CM | POA: Diagnosis not present

## 2022-02-16 DIAGNOSIS — D649 Anemia, unspecified: Secondary | ICD-10-CM | POA: Diagnosis not present

## 2022-02-16 DIAGNOSIS — R002 Palpitations: Secondary | ICD-10-CM | POA: Diagnosis not present

## 2022-02-16 DIAGNOSIS — M4800 Spinal stenosis, site unspecified: Secondary | ICD-10-CM | POA: Diagnosis not present

## 2022-02-16 DIAGNOSIS — L739 Follicular disorder, unspecified: Secondary | ICD-10-CM | POA: Diagnosis not present

## 2022-02-16 DIAGNOSIS — C649 Malignant neoplasm of unspecified kidney, except renal pelvis: Secondary | ICD-10-CM | POA: Diagnosis not present

## 2022-02-16 DIAGNOSIS — M2550 Pain in unspecified joint: Secondary | ICD-10-CM | POA: Diagnosis not present

## 2022-02-16 DIAGNOSIS — R109 Unspecified abdominal pain: Secondary | ICD-10-CM | POA: Diagnosis not present

## 2022-02-16 DIAGNOSIS — C7951 Secondary malignant neoplasm of bone: Secondary | ICD-10-CM | POA: Diagnosis not present

## 2022-02-16 DIAGNOSIS — D481 Neoplasm of uncertain behavior of connective and other soft tissue: Secondary | ICD-10-CM | POA: Diagnosis not present

## 2022-02-16 DIAGNOSIS — R21 Rash and other nonspecific skin eruption: Secondary | ICD-10-CM | POA: Diagnosis not present

## 2022-02-17 ENCOUNTER — Encounter: Payer: Self-pay | Admitting: Family Medicine

## 2022-02-17 DIAGNOSIS — G893 Neoplasm related pain (acute) (chronic): Secondary | ICD-10-CM

## 2022-02-17 DIAGNOSIS — R110 Nausea: Secondary | ICD-10-CM

## 2022-02-17 DIAGNOSIS — C641 Malignant neoplasm of right kidney, except renal pelvis: Secondary | ICD-10-CM

## 2022-02-17 NOTE — Telephone Encounter (Signed)
Please see below. Pt is aware that you are OOO.

## 2022-02-18 MED ORDER — HYDROCODONE-ACETAMINOPHEN 7.5-325 MG PO TABS: 2.0000 | ORAL_TABLET | ORAL | 0 refills | Status: DC

## 2022-02-18 MED ORDER — ONDANSETRON 4 MG PO TBDP: ORAL_TABLET | ORAL | 2 refills | Status: DC

## 2022-02-22 DIAGNOSIS — C799 Secondary malignant neoplasm of unspecified site: Secondary | ICD-10-CM | POA: Diagnosis not present

## 2022-02-22 DIAGNOSIS — M542 Cervicalgia: Secondary | ICD-10-CM | POA: Diagnosis not present

## 2022-02-25 ENCOUNTER — Other Ambulatory Visit: Payer: Self-pay | Admitting: Family Medicine

## 2022-02-25 DIAGNOSIS — I10 Essential (primary) hypertension: Secondary | ICD-10-CM

## 2022-02-27 DIAGNOSIS — C642 Malignant neoplasm of left kidney, except renal pelvis: Secondary | ICD-10-CM | POA: Diagnosis not present

## 2022-02-27 DIAGNOSIS — Z9049 Acquired absence of other specified parts of digestive tract: Secondary | ICD-10-CM | POA: Diagnosis not present

## 2022-02-27 DIAGNOSIS — C649 Malignant neoplasm of unspecified kidney, except renal pelvis: Secondary | ICD-10-CM | POA: Diagnosis not present

## 2022-02-27 DIAGNOSIS — C787 Secondary malignant neoplasm of liver and intrahepatic bile duct: Secondary | ICD-10-CM | POA: Diagnosis not present

## 2022-03-09 ENCOUNTER — Ambulatory Visit (INDEPENDENT_AMBULATORY_CARE_PROVIDER_SITE_OTHER): Payer: Medicare HMO | Admitting: Family Medicine

## 2022-03-09 ENCOUNTER — Ambulatory Visit: Payer: Medicare HMO

## 2022-03-09 ENCOUNTER — Other Ambulatory Visit: Payer: Self-pay | Admitting: Cardiovascular Disease

## 2022-03-09 ENCOUNTER — Encounter: Payer: Self-pay | Admitting: Family Medicine

## 2022-03-09 VITALS — BP 112/80 | HR 97 | Temp 97.7°F | Resp 18

## 2022-03-09 DIAGNOSIS — Z79899 Other long term (current) drug therapy: Secondary | ICD-10-CM

## 2022-03-09 DIAGNOSIS — J449 Chronic obstructive pulmonary disease, unspecified: Secondary | ICD-10-CM

## 2022-03-09 DIAGNOSIS — G893 Neoplasm related pain (acute) (chronic): Secondary | ICD-10-CM | POA: Diagnosis not present

## 2022-03-09 DIAGNOSIS — Z23 Encounter for immunization: Secondary | ICD-10-CM

## 2022-03-09 MED ORDER — UMECLIDINIUM BROMIDE 62.5 MCG/ACT IN AEPB: 1.0000 | INHALATION_SPRAY | Freq: Every day | RESPIRATORY_TRACT | 1 refills | Status: DC

## 2022-03-09 MED ORDER — ENTRESTO 24-26 MG PO TABS: 1.0000 | ORAL_TABLET | Freq: Two times a day (BID) | ORAL | 3 refills | Status: DC

## 2022-03-09 NOTE — Patient Instructions (Addendum)
It was good to see you today Try the Incruse Ellipta inhaler for your lungs- let me know how this works for you  Flu shot today Hang in there!   Please see me in 3-4 months and take care!

## 2022-03-09 NOTE — Progress Notes (Signed)
[facility] [address] [place], [place] [address] [phone]  Date:  03/09/2022   Name:  Douglas [name]   DOB:  04-Mar-1956   MRN:  [id number]  PCP:  Darreld Mclean, MD    Chief Complaint: Medication Problem (SOB, sinus drainage, Fatigue, nausea)   History of Present Illness:  Douglas [name] is a 66 y.o. very pleasant male patient who presents with the following:  Douglas [name] is here today for a visit, he thought he had an appointment although none was actually scheduled-thankfully we were able to fit him in.  He just wants to discuss his current medication and side effects/symptoms Most recent visit with myself was in May  He had hernia surgery done on 7/12- since then he feels like he is more SOB and has more difficulty walking esp uphill His cancer is also spreading and his tumors are getting larger -see imaging reports below.  Overall Douglas [name] is struggling.  He is having a lot of trouble with his teeth which makes it hard for him to eat.  His wife Douglas [name] is struggling with her mental health, exacerbated by his illness.    He saw his oncologist at [facility], Dr. Wendee Beavers most recently on 8/10:.  They recently started Afinitor Assessment and Plan:  Leiomyomatosis with renal cell cancer - Patient completed Day 1, Cycle 25 of Opdivo on 09/06/2021. Holding off treatment until I get Ipi approved so we can do Opdivo and Ipi together. He was hesitant on taking Ipi and Opdivo together. Stopped therapy due to all the complications making him weaker. Patient started Cabozantinib on 10/13/21. Patient is currently on Cabozantinib 40 mg. Holding treatment for surgery with Dr. Ninfa Linden. He is post surgery and is healing well. Scans before next visit. CT done on 01/24/22 shows progression of cancer. I explained to patient we will need to try a new drug. He will be getting treated with Afinitor. F/u scans in 6 weeks.   MRI of head. Because  patient is having recurrent dizziness and sometimes confusion  RTC 3 weeks.   Encounter for antineoplastic immunotherapy. He understands that the possibilities including pneumonitis, colitis, hepatitis etc. Apart from grade 1 immune mediated dermatitis, he has done well and he is using some Benadryl and sometimes topical steroid.   Pain management - 12.5 mg Fentanyl patch. He was too afraid to try them.   Heart Palpitations- he has been experiencing palpations for a few days. He is going to follow up with his cardiologist.   Bone mets - on Xgeva. Last received Xgeva on 10/13/2021.  Anemia - Mild, stable, asymptomatic.   Joint pain - Starting 3-4 weeks ago, he started having severe joint pain every morning, especially in his right elbow, his knee, and his fingers. His joints are not swollen or red and the pain improves during the day. I referred him to orthopedics because it seems likely to be osteoarthritic pain. He received an injection, but it hasn't helped his pain. He inquired about Synvisc.   He takes 2 tablets of Vicodin every 3-4 hours, which has helped his pain. I recommend he take pain medication instead of Lorazepam before bed to help prevent severe pain in the morning. Patient is hesitant about long acting pain medications because he does not tolerate narcotics well.   Folliculitis on his right arm - This is likely from [place]. I explained that as long as it is not diffuse, it is not  concerning.   Acid reflux- I advised him to double up on pantoprazole.   Weight loss - I stressed the importance of maintaining his weight. I had dietician reach out to him. He has gained 6 pounds since being off treatment.    Abdominal ultrasound 8/31 as follows: IMPRESSION: 1. Multiple hepatic masses, largest identified in right hepatic lobe, compatible with metastatic disease. 2. Prior cholecystectomy.   He also had a cervical spine MRI on 8/14 which showed likely metastatic disease  He was  seen by his cardiologist, Dr. Jerene Pitch on 8/18 History of STEMI in 2021, status post PCI Chronic systolic heart failure, most recent EF 42%.  They deferred AICD given cancer at this time Patient Active Problem List   Diagnosis Date Noted   Dermatitis 12/07/2020   High risk medication use 09/21/2020   NSTEMI (non-ST elevated myocardial infarction) ([place]) 05/24/2020   Acute ST elevation myocardial infarction (STEMI) ([place])    Bone metastases 07/26/2016   Bone lesion 06/02/2014   CAD (coronary artery disease) 03/25/2014   Protein-calorie malnutrition, severe ([place]) 10/17/2013   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) 08/20/2012   Renal calculi    GERD (gastroesophageal reflux disease)    Allergic rhinitis    Osteoarthritis    Encounter for long-term (current) use of other medications    Hyperlipidemia    Tobacco use disorder    COPD (chronic obstructive pulmonary disease) ([place])    Unspecified vitamin D deficiency    Hypertension     Past Medical History:  Diagnosis Date   Allergic rhinitis    Anxiety    Arthritis    "all over my body" (10/16/2013)   Cataract    Chronic back pain    "neck to lower back" (10/16/2013)   Colon polyps    Compression fracture    S/P L-SPINE; pt does not recall this hx on 10/16/2013   COPD (chronic obstructive pulmonary disease) (HCC)    FVC .66   Coronary artery disease    Depression    "situational since 09/22/2013 OR"   Encounter for long-term (current) use of other medications    GERD (gastroesophageal reflux disease)    Hemoptysis    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)    History of blood transfusion 09/2013   "think so; not sure; related to big OR"   History of surgical fusion joint    DDD C-SPINE   Hx of blood clots    Hyperlipidemia    Hypertension    Hypertensive retinopathy    Leiomyoma OF SKIN   INCREASED RISK RENAL CELL CA  NEEDS CT OF KIDNEYS EVERY 2 YEARS NEXT DUE  02/06/13   Melanoma in situ  ([place])    Melanoma of back ([place])    Neuromuscular disorder ([place])    Osteoarthritis    Renal calculi    Renal cell carcinoma ([place])    type 2; papillary (metastized to bone)   Tobacco use disorder    Unspecified vitamin D deficiency     Past Surgical History:  Procedure Laterality Date   ANTERIOR CERVICAL DECOMP/DISCECTOMY FUSION  1992   ANTERIOR CRUCIATE LIGAMENT REPAIR Bilateral [phone]   APPENDECTOMY  09/22/2013   CHOLECYSTECTOMY  09/22/2013   "had tumors in it"   CORONARY STENT INTERVENTION N/A 05/24/2020   Procedure: CORONARY STENT INTERVENTION;  Surgeon: Martinique, Peter M, MD;  Location: [facility];  Service: Cardiovascular;  Laterality: N/A;   HAND LIGAMENT RECONSTRUCTION Right  HARVEST BONE GRAFT Right 1992   "hip; for neck fusion"   INGUINAL HERNIA REPAIR Right 2004   INGUINAL HERNIA REPAIR Left 12/28/2021   Procedure: OPEN LEFT INGUINAL HERNIA REPAIR;  Surgeon: Coralie Keens, MD;  Location: [facility];  Service: General;  Laterality: Left;   INSERTION OF MESH Left 12/28/2021   Procedure: INSERTION OF MESH;  Surgeon: Coralie Keens, MD;  Location: [facility];  Service: General;  Laterality: Left;   LEFT HEART CATH AND CORONARY ANGIOGRAPHY N/A 05/24/2020   Procedure: LEFT HEART CATH AND CORONARY ANGIOGRAPHY;  Surgeon: Martinique, Peter M, MD;  Location: [facility];  Service: Cardiovascular;  Laterality: N/A;   LIGAMENT REPAIR Right ~ 2001   "little finger"   LYMPH NODE DISSECTION Right 09/22/2013   "in the area of the kidney; they were clean"   MELANOMA EXCISION  ~ 2007   "off my back"   PARTIAL NEPHRECTOMY Right 09/22/2013   removal of fascia overlying the right psoas Right 09/22/2013   RENAL ARTERY STENT Right 09/22/2013   RIGHT COLECTOMY Right 09/22/2013   ROBOTIC ASSITED PARTIAL NEPHRECTOMY  05/22/2012   Procedure: ROBOTIC ASSITED PARTIAL NEPHRECTOMY;  Surgeon: Alexis Frock, MD;  Location: [facility];  Service: Urology;  Laterality: Right;   Right Robotic Cyst Decortication and Partial Nephrectomy     Social History   Tobacco Use   Smoking status: Former    Packs/day: 0.50    Years: 40.00    Total pack years: 20.00    Types: Cigarettes    Quit date: 2017    Years since quitting: 6.7   Smokeless tobacco: Never  Vaping Use   Vaping Use: Never used  Substance Use Topics   Alcohol use: Not Currently   Drug use: Yes    Types: Marijuana    Comment: smokes every day    Family History  Adopted: Yes  Problem Relation Age of Onset   Blindness Mother    Glaucoma Mother    Hypertension Father     Allergies  Allergen Reactions   Avelox [Moxifloxacin Hcl In Nacl] Hives, Shortness Of Breath and Swelling   Enbucrilate Hives and Rash    DERMABOND   Tape Dermatitis and Rash    Just the glue    Medication list has been reviewed and updated.  Current Outpatient Medications on File Prior to Visit  Medication Sig Dispense Refill   aspirin EC 81 MG tablet Take 1 tablet (81 mg total) by mouth daily. Swallow whole.     B Complex Vitamins (B COMPLEX 100 PO) Take 100 mg by mouth daily.     Calcium Carb-Cholecalciferol (CALCIUM 600/VITAMIN D PO) Take 1 capsule by mouth daily.     carvedilol (COREG) 3.125 MG tablet TAKE 1 TABLET BY MOUTH TWICE A DAY WITH A MEAL 60 tablet 2   cetirizine (ZYRTEC) 10 MG tablet Take 10 mg by mouth daily.     cholecalciferol (VITAMIN D3) 25 MCG (1000 UNIT) tablet Take 2,000 Units by mouth daily.     denosumab (XGEVA) 120 MG/1.7ML SOLN injection Inject 120 mg into the skin every 30 (thirty) days.      DULoxetine (CYMBALTA) 20 MG capsule TAKE ONE CAPSULE BY MOUTH DAILY 90 capsule 3   everolimus (AFINITOR) 10 MG tablet Take 1 tablet by mouth daily.     HYDROcodone-acetaminophen (NORCO) 7.5-325 MG tablet Take 2 tablets by mouth See admin instructions. Take 2 every 4 hours through out the day until taking ativan at bedtime, do not mix  260 tablet 0   loperamide (IMODIUM A-D) 2 MG tablet Take 2 mg by mouth  4 (four) times daily as needed for diarrhea or loose stools.     LORazepam (ATIVAN) 1 MG tablet Take 0.5-1 tablets (0.5-1 mg total) by mouth at bedtime as needed for sleep. TAKE ONE HALF OR ONE TABLET BY MOUTH AT BEDTIME IF NEEDED FOR SLEEP, DO NOT TAKE WITH PAIN MEDS 30 tablet 3   Multiple Vitamins-Minerals (CENTRUM SILVER 50+MEN PO) Take 1 tablet by mouth daily.     naloxone (NARCAN) nasal spray 4 mg/0.1 mL Use one spray in nostril every 2-3 minutes as needed for drug overdose (cancer patient on opioid therapy) 2 each PRN   nitroGLYCERIN (NITROSTAT) 0.4 MG SL tablet Place 1 tablet (0.4 mg total) under the tongue every 5 (five) minutes x 3 doses as needed for chest pain. Please dispense #25 x 4 bottle 100 tablet 1   ondansetron (ZOFRAN-ODT) 4 MG disintegrating tablet TAKE 1 TABLET BY MOUTH SUBLINGUALLY EVERY 8 HOURS AS NEEDED FOR NAUSEA OR VOMITING 40 tablet 2   pantoprazole (PROTONIX) 20 MG tablet TAKE 1 TABLET BY MOUTH EVERY DAY 90 tablet 3   Polyethyl Glycol-Propyl Glycol (SYSTANE OP) Place 1 drop into both eyes 2 (two) times daily as needed (dry eyes).     pseudoephedrine (SUDAFED) 30 MG tablet Take 30 mg by mouth in the morning.     rosuvastatin (CRESTOR) 10 MG tablet TAKE 1 TABLET BY MOUTH EVERY DAY 90 tablet 2   sacubitril-valsartan (ENTRESTO) 24-26 MG Take 1 tablet by mouth 2 (two) times daily. 180 tablet 3   sodium bicarbonate 650 MG tablet Take 650 mg by mouth daily.     sodium chloride (OCEAN) 0.65 % SOLN nasal spray Place 1 spray into both nostrils at bedtime.     traZODone (DESYREL) 50 MG tablet Take 25 mg by mouth at bedtime.     vitamin C (ASCORBIC ACID) 500 MG tablet Take 500 mg by mouth daily.     No current facility-administered medications on file prior to visit.    Review of Systems:  As per HPI- otherwise negative.   Physical Examination: Vitals:   03/09/22 1134  BP: 112/80  Pulse: 97  Resp: 18  Temp: 97.7 F (36.5 C)  SpO2: 95%   There were no vitals filed for  this visit. There is no height or weight on file to calculate BMI. Ideal Body Weight:    GEN: no acute distress.  Appears chronically ill, has lost weight HEENT: Atraumatic, Normocephalic.  Ears and Nose: No external deformity. CV: RRR, No M/G/R. No JVD. No thrill. No extra heart sounds. PULM: CTA B, no wheezes, crackles, rhonchi. No retractions. No resp. distress. No accessory muscle use. ABD: S, NT, ND EXTR: No c/c/e PSYCH: Normally interactive. Conversant.    Assessment and Plan: Chronic obstructive pulmonary disease, unspecified COPD type ([place]) - Plan: umeclidinium bromide (INCRUSE ELLIPTA) 62.5 MCG/ACT AEPB  Need for influenza vaccination - Plan: Flu Vaccine QUAD High Dose(Fluad)  Chronic pain due to neoplasm - Plan: DRUG MONITORING, PANEL 8 WITH CONFIRMATION, URINE  Patient seen today for follow-up.  He is unfortunately suffering effects of his cancer and also of his cancer treatment Given flu shot today I am treating his chronic pain, will do required annual drug screen today [place] has noted more shortness of breath, this is stable over the last 6 months or so.  Not a new thing so prefers not to pursue more imaging.  He would like to try an inhaler to see if it might help.  I prescribed Incruse Ellipta which appears to be preferred by his insurance.  He does not have history of glaucoma.  I asked him to let me know if this helps him at all  Signed Lamar Blinks, MD

## 2022-03-13 LAB — DRUG MONITORING, PANEL 8 WITH CONFIRMATION, URINE
6 Acetylmorphine: NEGATIVE ng/mL (ref <10)
Alcohol Metabolites: NEGATIVE ng/mL (ref <500)
Alphahydroxyalprazolam: NEGATIVE ng/mL (ref <25)
Alphahydroxymidazolam: NEGATIVE ng/mL (ref <50)
Alphahydroxytriazolam: NEGATIVE ng/mL (ref <50)
Aminoclonazepam: NEGATIVE ng/mL (ref <25)
Amphetamines: NEGATIVE ng/mL (ref <500)
Benzodiazepines: POSITIVE ng/mL — AB (ref <100)
Buprenorphine, Urine: NEGATIVE ng/mL (ref <5)
Cocaine Metabolite: NEGATIVE ng/mL (ref <150)
Codeine: NEGATIVE ng/mL (ref <50)
Creatinine: 115.1 mg/dL (ref >=20.0)
Hydrocodone: 3312 ng/mL — ABNORMAL HIGH (ref <50)
Hydromorphone: 2635 ng/mL — ABNORMAL HIGH (ref <50)
Hydroxyethylflurazepam: NEGATIVE ng/mL (ref <50)
Lorazepam: 622 ng/mL — ABNORMAL HIGH (ref <50)
MDMA: NEGATIVE ng/mL (ref <500)
Marijuana Metabolite: 279 ng/mL — ABNORMAL HIGH (ref <5)
Marijuana Metabolite: POSITIVE ng/mL — AB (ref <20)
Morphine: NEGATIVE ng/mL (ref <50)
Nordiazepam: NEGATIVE ng/mL (ref <50)
Norhydrocodone: 2387 ng/mL — ABNORMAL HIGH (ref <50)
Opiates: POSITIVE ng/mL — AB (ref <100)
Oxazepam: NEGATIVE ng/mL (ref <50)
Oxidant: NEGATIVE ug/mL (ref <200)
Oxycodone: NEGATIVE ng/mL (ref <100)
Temazepam: NEGATIVE ng/mL (ref <50)
pH: 5.8 (ref 4.5–9.0)

## 2022-03-13 LAB — DM TEMPLATE

## 2022-03-14 ENCOUNTER — Encounter (HOSPITAL_COMMUNITY): Payer: Self-pay

## 2022-03-14 ENCOUNTER — Emergency Department (HOSPITAL_COMMUNITY): Payer: Medicare HMO

## 2022-03-14 ENCOUNTER — Emergency Department (HOSPITAL_COMMUNITY)
Admission: EM | Admit: 2022-03-14 | Discharge: 2022-03-14 | Disposition: A | Discharge status: Home / Self Care | Payer: Medicare HMO | Attending: Emergency Medicine | Admitting: Emergency Medicine

## 2022-03-14 DIAGNOSIS — Z7982 Long term (current) use of aspirin: Secondary | ICD-10-CM | POA: Diagnosis not present

## 2022-03-14 DIAGNOSIS — I2510 Atherosclerotic heart disease of native coronary artery without angina pectoris: Secondary | ICD-10-CM | POA: Insufficient documentation

## 2022-03-14 DIAGNOSIS — J449 Chronic obstructive pulmonary disease, unspecified: Secondary | ICD-10-CM | POA: Diagnosis not present

## 2022-03-14 DIAGNOSIS — K769 Liver disease, unspecified: Secondary | ICD-10-CM | POA: Diagnosis not present

## 2022-03-14 DIAGNOSIS — C799 Secondary malignant neoplasm of unspecified site: Secondary | ICD-10-CM

## 2022-03-14 DIAGNOSIS — R1031 Right lower quadrant pain: Secondary | ICD-10-CM | POA: Insufficient documentation

## 2022-03-14 DIAGNOSIS — R109 Unspecified abdominal pain: Secondary | ICD-10-CM | POA: Diagnosis not present

## 2022-03-14 DIAGNOSIS — R911 Solitary pulmonary nodule: Secondary | ICD-10-CM | POA: Diagnosis not present

## 2022-03-14 DIAGNOSIS — I10 Essential (primary) hypertension: Secondary | ICD-10-CM | POA: Diagnosis not present

## 2022-03-14 DIAGNOSIS — R21 Rash and other nonspecific skin eruption: Secondary | ICD-10-CM | POA: Diagnosis not present

## 2022-03-14 DIAGNOSIS — J439 Emphysema, unspecified: Secondary | ICD-10-CM | POA: Diagnosis not present

## 2022-03-14 DIAGNOSIS — R1011 Right upper quadrant pain: Secondary | ICD-10-CM | POA: Diagnosis not present

## 2022-03-14 DIAGNOSIS — S2249XA Multiple fractures of ribs, unspecified side, initial encounter for closed fracture: Secondary | ICD-10-CM

## 2022-03-14 DIAGNOSIS — I700 Atherosclerosis of aorta: Secondary | ICD-10-CM | POA: Diagnosis not present

## 2022-03-14 LAB — URINALYSIS, ROUTINE W REFLEX MICROSCOPIC
Bilirubin Urine: NEGATIVE
Glucose, UA: NEGATIVE mg/dL
Hgb urine dipstick: NEGATIVE
Ketones, ur: NEGATIVE mg/dL
Leukocytes,Ua: NEGATIVE
Nitrite: NEGATIVE
Protein, ur: 100 mg/dL — AB
Specific Gravity, Urine: 1.018 (ref 1.005–1.030)
pH: 5 (ref 5.0–8.0)

## 2022-03-14 LAB — CBC WITH DIFFERENTIAL/PLATELET
Abs Immature Granulocytes: 0.01 10*3/uL (ref 0.00–0.07)
Basophils Absolute: 0 10*3/uL (ref 0.0–0.1)
Basophils Relative: 1 %
Eosinophils Absolute: 0.3 10*3/uL (ref 0.0–0.5)
Eosinophils Relative: 5 %
HCT: 35.5 % — ABNORMAL LOW (ref 39.0–52.0)
Hemoglobin: 11.6 g/dL — ABNORMAL LOW (ref 13.0–17.0)
Immature Granulocytes: 0 %
Lymphocytes Relative: 17 %
Lymphs Abs: 1.1 10*3/uL (ref 0.7–4.0)
MCH: 28.5 pg (ref 26.0–34.0)
MCHC: 32.7 g/dL (ref 30.0–36.0)
MCV: 87.2 fL (ref 80.0–100.0)
Monocytes Absolute: 0.7 10*3/uL (ref 0.1–1.0)
Monocytes Relative: 11 %
Neutro Abs: 4.4 10*3/uL (ref 1.7–7.7)
Neutrophils Relative %: 66 %
Platelets: 252 10*3/uL (ref 150–400)
RBC: 4.07 MIL/uL — ABNORMAL LOW (ref 4.22–5.81)
RDW: 13.9 % (ref 11.5–15.5)
WBC: 6.6 10*3/uL (ref 4.0–10.5)
nRBC: 0 % (ref 0.0–0.2)

## 2022-03-14 LAB — TROPONIN I (HIGH SENSITIVITY)
Troponin I (High Sensitivity): 15 ng/L (ref <18)
Troponin I (High Sensitivity): 14 ng/L (ref <18)

## 2022-03-14 LAB — COMPREHENSIVE METABOLIC PANEL
ALT: 22 U/L (ref 0–44)
AST: 40 U/L (ref 15–41)
Albumin: 3.6 g/dL (ref 3.5–5.0)
Alkaline Phosphatase: 157 U/L — ABNORMAL HIGH (ref 38–126)
Anion gap: 11 (ref 5–15)
BUN: 33 mg/dL — ABNORMAL HIGH (ref 8–23)
CO2: 18 mmol/L — ABNORMAL LOW (ref 22–32)
Calcium: 9.4 mg/dL (ref 8.9–10.3)
Chloride: 103 mmol/L (ref 98–111)
Creatinine, Ser: 1.64 mg/dL — ABNORMAL HIGH (ref 0.61–1.24)
GFR, Estimated: 46 mL/min — ABNORMAL LOW (ref >60)
Glucose, Bld: 119 mg/dL — ABNORMAL HIGH (ref 70–99)
Potassium: 4.3 mmol/L (ref 3.5–5.1)
Sodium: 132 mmol/L — ABNORMAL LOW (ref 135–145)
Total Bilirubin: 0.5 mg/dL (ref 0.3–1.2)
Total Protein: 7.3 g/dL (ref 6.5–8.1)

## 2022-03-14 LAB — LIPASE, BLOOD: Lipase: 132 U/L — ABNORMAL HIGH (ref 11–51)

## 2022-03-14 MED ORDER — LACTATED RINGERS IV BOLUS
1000.0000 mL | Freq: Once | INTRAVENOUS | Status: AC
  Administered 2022-03-14: 1000 mL via INTRAVENOUS

## 2022-03-14 MED ORDER — HYDROMORPHONE HCL 1 MG/ML IJ SOLN
1.0000 mg | Freq: Once | INTRAMUSCULAR | Status: AC
  Administered 2022-03-14: 1 mg via INTRAVENOUS
  Filled 2022-03-14: qty 1

## 2022-03-14 MED ORDER — IOHEXOL 350 MG/ML SOLN
100.0000 mL | Freq: Once | INTRAVENOUS | Status: AC | PRN
  Administered 2022-03-14: 100 mL via INTRAVENOUS

## 2022-03-14 NOTE — ED Provider Triage Note (Signed)
Emergency Medicine Provider Triage Evaluation Note  Douglas [name] , a 66 y.o. male  was evaluated in triage.  Pt complains of right flank pain. History of renal cancer with METs. Admits to SOB. No chest pain.   Review of Systems  Positive: Flank pain Negative: fever  Physical Exam  BP (!) 120/100 (BP Location: Left Arm)   Pulse (!) 110   Temp 98.9 F (37.2 C) (Oral)   Resp 18   SpO2 96%  Gen:   Awake, no distress   Resp:  Normal effort  MSK:   Moves extremities without difficulty  Other:    Medical Decision Making  Medically screening exam initiated at 12:18 PM.  Appropriate orders placed.  Douglas [name] was informed that the remainder of the evaluation will be completed by another provider, this initial triage assessment does not replace that evaluation, and the importance of remaining in the ED until their evaluation is complete.  Labs Will defer imaging to provider in back   Suzy Bouchard, [place] 03/14/22 1219

## 2022-03-14 NOTE — Discharge Instructions (Signed)
Start taking your fentanyl patch.  You can continue to use your hydrocodone for more severe pain.  Follow-up with your oncology doctor as planned.

## 2022-03-14 NOTE — ED Provider Notes (Signed)
[facility] DEPT Provider Note   CSN: [id number] Arrival date & time: 03/14/22  1139     History  Chief Complaint  Patient presents with   Flank Pain         Renny Remer is a 66 y.o. male.   Flank Pain Associated symptoms include abdominal pain.  Patient presents for right flank pain.  Medical history includes nephrolithiasis, GERD, HLD, COPD, HTN, CAD, anxiety, depression, and metastatic renal cell carcinoma, with known hepatic masses.  He is followed by [facility] hematology-oncology.  He has undergone multiple surgeries and different chemotherapies.  Prior surgeries include 2 partial right nephrectomy's, cholecystectomy, appendectomy, ascending colectomy.  Most of the surgeries were in 2015.  Current therapy is Afinitor.  Patient does have chronic pain secondary to known bone metastases.  He denies any right upper quadrant chronic pain.  At approximately 3 AM, he developed sudden onset, severe, sharp pain in his right upper quadrant.  Pain has been persistent.  It comes and waves as if he is having spasms.  It is worsened with movements, deep inspiration, and coughing.  He denies any history of similar pain.  He has a poor appetite chronically.  He has been able to eat recently.  He does experience nausea with his chemotherapy but has not had any new nausea.  He has not had any vomiting.  Last bowel movement was yesterday.  For his chronic pain, he takes 15 mg Norco every 4 hours.  His last dose was 10 AM this morning.  Currently, he endorses severe pain in his right flank and right upper quadrant.     Home Medications Prior to Admission medications   Medication Sig Start Date End Date Taking? Authorizing Provider  aspirin EC 81 MG tablet Take 1 tablet (81 mg total) by mouth daily. Swallow whole. 08/31/20   Sherren Mocha, MD  B Complex Vitamins (B COMPLEX 100 PO) Take 100 mg by mouth daily.    [provider]  Calcium Carb-Cholecalciferol  (CALCIUM 600/VITAMIN D PO) Take 1 capsule by mouth daily.    [provider]  carvedilol (COREG) 3.125 MG tablet TAKE 1 TABLET BY MOUTH TWICE A DAY WITH A MEAL 01/30/22   Josue Hector, MD  cetirizine (ZYRTEC) 10 MG tablet Take 10 mg by mouth daily.    [provider]  cholecalciferol (VITAMIN D3) 25 MCG (1000 UNIT) tablet Take 2,000 Units by mouth daily.    [provider]  denosumab (XGEVA) 120 MG/1.7ML SOLN injection Inject 120 mg into the skin every 30 (thirty) days.     [provider]  DULoxetine (CYMBALTA) 20 MG capsule TAKE ONE CAPSULE BY MOUTH DAILY 10/21/21   Copland, Gay Filler, MD  everolimus (AFINITOR) 10 MG tablet Take 1 tablet by mouth daily. 01/27/22   [provider]  HYDROcodone-acetaminophen (NORCO) 7.5-325 MG tablet Take 2 tablets by mouth See admin instructions. Take 2 every 4 hours through out the day until taking ativan at bedtime, do not mix 02/18/22   Copland, Gay Filler, MD  loperamide (IMODIUM A-D) 2 MG tablet Take 2 mg by mouth 4 (four) times daily as needed for diarrhea or loose stools.    [provider]  LORazepam (ATIVAN) 1 MG tablet Take 0.5-1 tablets (0.5-1 mg total) by mouth at bedtime as needed for sleep. TAKE ONE HALF OR ONE TABLET BY MOUTH AT BEDTIME IF NEEDED FOR SLEEP, DO NOT TAKE WITH PAIN MEDS 12/21/21   Copland, Gay Filler, MD  Multiple Vitamins-Minerals (CENTRUM SILVER 50+MEN PO) Take 1 tablet by mouth daily.    [provider]  naloxone [facility]) nasal spray 4 mg/0.1 mL Use one spray in nostril every 2-3 minutes as needed for drug overdose (cancer patient on opioid therapy) 09/20/21   Copland, Gay Filler, MD  nitroGLYCERIN (NITROSTAT) 0.4 MG SL tablet Place 1 tablet (0.4 mg total) under the tongue every 5 (five) minutes x 3 doses as needed for chest pain. Please dispense #25 x 4 bottle 06/02/20   Truitt Merle C, NP  ondansetron (ZOFRAN-ODT) 4 MG disintegrating tablet TAKE 1 TABLET BY MOUTH SUBLINGUALLY EVERY  8 HOURS AS NEEDED FOR NAUSEA OR VOMITING 02/18/22   Copland, Gay Filler, MD  pantoprazole (PROTONIX) 20 MG tablet TAKE 1 TABLET BY MOUTH EVERY DAY 11/01/21   Copland, Gay Filler, MD  Polyethyl Glycol-Propyl Glycol (SYSTANE OP) Place 1 drop into both eyes 2 (two) times daily as needed (dry eyes).    [provider]  pseudoephedrine (SUDAFED) 30 MG tablet Take 30 mg by mouth in the morning.    [provider]  rosuvastatin (CRESTOR) 10 MG tablet TAKE 1 TABLET BY MOUTH EVERY DAY 11/28/21   Josue Hector, MD  sacubitril-valsartan (ENTRESTO) 24-26 MG Take 1 tablet by mouth 2 (two) times daily. 03/09/22   Josue Hector, MD  sodium bicarbonate 650 MG tablet Take 650 mg by mouth daily. 01/03/22   [provider]  sodium chloride (OCEAN) 0.65 % SOLN nasal spray Place 1 spray into both nostrils at bedtime.    [provider]  traZODone (DESYREL) 50 MG tablet Take 25 mg by mouth at bedtime.    [provider]  umeclidinium bromide (INCRUSE ELLIPTA) 62.5 MCG/ACT AEPB Inhale 1 puff into the lungs daily. 03/09/22   Copland, Gay Filler, MD  vitamin C (ASCORBIC ACID) 500 MG tablet Take 500 mg by mouth daily.    [provider]      Allergies    Avelox [moxifloxacin hcl in nacl], Enbucrilate, and Tape    Review of Systems   Review of Systems  Gastrointestinal:  Positive for abdominal pain.  Genitourinary:  Positive for flank pain.  All other systems reviewed and are negative.   Physical Exam Updated Vital Signs BP (!) 136/97   Pulse 95   Temp 98.9 F (37.2 C) (Oral)   Resp 13   SpO2 96%  Physical Exam Vitals and nursing note reviewed.  Constitutional:      General: He is not in acute distress.    Appearance: Normal appearance. He is well-developed. He is not ill-appearing, toxic-appearing or diaphoretic.  HENT:     Head: Normocephalic and atraumatic.     Right Ear: External ear normal.     Left Ear: External ear normal.     Nose: Nose normal.      Mouth/Throat:     Mouth: Mucous membranes are moist.     Pharynx: Oropharynx is clear.  Eyes:     Extraocular Movements: Extraocular movements intact.     Conjunctiva/sclera: Conjunctivae normal.  Cardiovascular:     Rate and Rhythm: Normal rate and regular rhythm.     Heart sounds: No murmur heard. Pulmonary:     Effort: Pulmonary effort is normal. No respiratory distress.     Breath sounds: Normal breath sounds. No wheezing or rales.  Chest:     Chest wall: No tenderness.  Abdominal:     General: There is no distension.     Palpations:  Abdomen is soft.     Tenderness: There is no abdominal tenderness.     Hernia: A hernia is present.  Musculoskeletal:        General: No swelling. Normal range of motion.     Cervical back: Normal range of motion and neck supple.     Right lower leg: No edema.     Left lower leg: No edema.  Skin:    General: Skin is warm and dry.     Capillary Refill: Capillary refill takes less than 2 seconds.     Coloration: Skin is not jaundiced or pale.     Findings: Rash (Mild rash on chest and abdomen that he attributes to his chemotherapy.) present.  Neurological:     General: No focal deficit present.     Mental Status: He is alert and oriented to person, place, and time.     Cranial Nerves: No cranial nerve deficit.     Sensory: No sensory deficit.     Motor: No weakness.     Coordination: Coordination normal.  Psychiatric:        Mood and Affect: Mood normal.        Behavior: Behavior normal.        Thought Content: Thought content normal.        Judgment: Judgment normal.     ED Results / Procedures / Treatments   Labs (all labs ordered are listed, but only abnormal results are displayed) Labs Reviewed  CBC WITH DIFFERENTIAL/PLATELET - Abnormal; Notable for the following components:      Result Value   RBC 4.07 (*)    Hemoglobin 11.6 (*)    HCT 35.5 (*)    All other components within normal limits  COMPREHENSIVE METABOLIC PANEL -  Abnormal; Notable for the following components:   Sodium 132 (*)    CO2 18 (*)    Glucose, Bld 119 (*)    BUN 33 (*)    Creatinine, Ser 1.64 (*)    Alkaline Phosphatase 157 (*)    GFR, Estimated 46 (*)    All other components within normal limits  LIPASE, BLOOD - Abnormal; Notable for the following components:   Lipase 132 (*)    All other components within normal limits  URINALYSIS, ROUTINE W REFLEX MICROSCOPIC - Abnormal; Notable for the following components:   APPearance HAZY (*)    Protein, ur 100 (*)    Bacteria, UA RARE (*)    All other components within normal limits  TROPONIN I (HIGH SENSITIVITY)  TROPONIN I (HIGH SENSITIVITY)    EKG EKG Interpretation  Date/Time:  Tuesday March 14 2022 12:30:15 EDT Ventricular Rate:  91 PR Interval:  167 QRS Duration: 77 QT Interval:  340 QTC Calculation: 419 R Axis:   62 Text Interpretation: Sinus rhythm Anterior infarct, old No significant change was found Confirmed by Ezequiel Essex [phone]) on 03/14/2022 2:10:33 PM  Radiology No results found.  Procedures Procedures    Medications Ordered in ED Medications  lactated ringers bolus 1,000 mL (1,000 mLs Intravenous New Bag/Given 03/14/22 1432)  HYDROmorphone (DILAUDID) injection 1 mg (1 mg Intravenous Given 03/14/22 1430)  iohexol (OMNIPAQUE) 350 MG/ML injection 100 mL (100 mLs Intravenous Contrast Given 03/14/22 1458)    ED Course/ Medical Decision Making/ A&P                           Medical Decision Making Amount and/or Complexity of Data Reviewed Radiology: ordered.  Risk Prescription drug management.   This patient presents to the ED for concern of right flank pain, this involves an extensive number of treatment options, and is a complaint that carries with it a high risk of complications and morbidity.  The differential diagnosis includes nephrolithiasis, pyelonephritis, retroperitoneal hematoma, hepatitis, worsening metastatic disease, RLL pneumonia, PE,  pancreatitis   Co morbidities that complicate the patient evaluation  nephrolithiasis, GERD, HLD, COPD, HTN, CAD, anxiety, depression, and metastatic renal cell carcinoma, with known hepatic masses   Additional history obtained:  Additional history obtained from N/A External records from outside source obtained and reviewed including EMR   Lab Tests:  I Ordered, and personally interpreted labs.  The pertinent results include: Baseline CKD, normal electrolytes, baseline elevation in alkaline phosphatase, normal transaminases and normal bilirubin.  Lipase is moderately elevated.  Anemia is baseline and no leukocytosis is present.  Urinalysis not consistent with UTI/pyelonephritis.  Troponin is normal.   Imaging Studies ordered:  I ordered imaging studies including CTA chest, CT of abdomen and pelvis I independently visualized and interpreted imaging which showed (results pending at time of signout) I agree with the radiologist interpretation   Cardiac Monitoring: / EKG:  The patient was maintained on a cardiac monitor.  I personally viewed and interpreted the cardiac monitored which showed an underlying rhythm of: Sinus rhythm  Problem List / ED Course / Critical interventions / Medication management  Patient is a pleasant 66 year old male with history of right RCC with known metastatic spread to bones and liver, presents for acute onset of right flank/right upper quadrant pain.  Onset of pain was 3 AM.  It has been persistent since that time.  It is sharp in nature.  It is characterized by frequent intermittent spasms.  He has not had any associated nausea.  He does endorse shortness of breath over the past several weeks.  Patient is overall well-appearing on exam.  His vital signs are reassuring.  Surprisingly, he does not have significant tenderness in this area of his pain.  He is noted to be quite uncomfortable and will jump off the bed every time he gets 1 of these pain and  spasms.  Dilaudid was ordered for analgesia.  Patient was given IV fluids for hydration and renal protection in anticipation of contrasted CT studies.  A CTA of chest was ordered for possible PE.  CT of abdomen and pelvis was ordered to assess for intra-abdominal etiology.  His lab work is largely unremarkable.  His creatinine is baseline.  He does not have any elevations in transaminases or bilirubin.  His lipase does appear to be moderately elevated and this does raise concern for pancreatic etiology.  He does not have any recent prior lipases for comparison.  On reassessment, patient reports improved pain.  Results of CT imaging are pending at time of signout.  Care of patient was signed out to oncoming ED provider. I ordered medication including IV fluid for duration; Dilaudid for analgesia Reevaluation of the patient after these medicines showed that the patient improved I have reviewed the patients home medicines and have made adjustments as needed   Social Determinants of Health:  Has access to outpatient care         Final Clinical Impression(s) / ED Diagnoses Final diagnoses:  Right flank pain    Rx / DC Orders ED Discharge Orders     None         Godfrey Pick, MD 03/14/22 1545

## 2022-03-14 NOTE — ED Triage Notes (Signed)
Pt arrived via POV, c/o right sided flank pain, worsening with mvmt and taking a deep breath. Hx of kidney CA

## 2022-03-14 NOTE — ED Provider Notes (Signed)
Patient initially seen by Dr. Doren Custard.  Please see his note.  CT scans do not show any acute abnormalities.  Patient does have subacute rib fractures.  He also has extensive burden in the liver.  Suspect the pathologic fractures and the tumor burden as a source of his pain.  There is no signs of acute hemorrhage or infection.  No obstruction.  Patient does have fentanyl patches for pain.  He has been hesitant to use them.  Encouraged him to start using that medication.  He can also use his hydrocodone for more severe pain relief.  Evaluation and diagnostic testing in the emergency department does not suggest an emergent condition requiring admission or immediate intervention beyond what has been performed at this time.  The patient is safe for discharge and has been instructed to return immediately for worsening symptoms, change in symptoms or any other concerns.   Douglas Rank, MD 03/14/22 [phone]

## 2022-03-16 ENCOUNTER — Encounter: Payer: Self-pay | Admitting: Family Medicine

## 2022-03-16 DIAGNOSIS — G893 Neoplasm related pain (acute) (chronic): Secondary | ICD-10-CM

## 2022-03-16 DIAGNOSIS — C641 Malignant neoplasm of right kidney, except renal pelvis: Secondary | ICD-10-CM

## 2022-03-17 MED ORDER — HYDROCODONE-ACETAMINOPHEN 7.5-325 MG PO TABS
2.0000 | ORAL_TABLET | ORAL | 0 refills | Status: DC
Start: 2022-03-17 — End: 2022-04-18

## 2022-03-22 DIAGNOSIS — M4800 Spinal stenosis, site unspecified: Secondary | ICD-10-CM | POA: Diagnosis not present

## 2022-03-22 DIAGNOSIS — Z1509 Genetic susceptibility to other malignant neoplasm: Secondary | ICD-10-CM | POA: Diagnosis not present

## 2022-03-22 DIAGNOSIS — M25521 Pain in right elbow: Secondary | ICD-10-CM | POA: Diagnosis not present

## 2022-03-22 DIAGNOSIS — L739 Follicular disorder, unspecified: Secondary | ICD-10-CM | POA: Diagnosis not present

## 2022-03-22 DIAGNOSIS — R002 Palpitations: Secondary | ICD-10-CM | POA: Diagnosis not present

## 2022-03-22 DIAGNOSIS — C7951 Secondary malignant neoplasm of bone: Secondary | ICD-10-CM | POA: Diagnosis not present

## 2022-03-22 DIAGNOSIS — M25542 Pain in joints of left hand: Secondary | ICD-10-CM | POA: Diagnosis not present

## 2022-03-22 DIAGNOSIS — C649 Malignant neoplasm of unspecified kidney, except renal pelvis: Secondary | ICD-10-CM | POA: Diagnosis not present

## 2022-03-22 DIAGNOSIS — M25541 Pain in joints of right hand: Secondary | ICD-10-CM | POA: Diagnosis not present

## 2022-03-22 DIAGNOSIS — D649 Anemia, unspecified: Secondary | ICD-10-CM | POA: Diagnosis not present

## 2022-03-22 DIAGNOSIS — R059 Cough, unspecified: Secondary | ICD-10-CM | POA: Diagnosis not present

## 2022-03-22 DIAGNOSIS — M25562 Pain in left knee: Secondary | ICD-10-CM | POA: Diagnosis not present

## 2022-03-22 DIAGNOSIS — M25569 Pain in unspecified knee: Secondary | ICD-10-CM | POA: Diagnosis not present

## 2022-03-22 DIAGNOSIS — M25549 Pain in joints of unspecified hand: Secondary | ICD-10-CM | POA: Diagnosis not present

## 2022-03-22 DIAGNOSIS — M25561 Pain in right knee: Secondary | ICD-10-CM | POA: Diagnosis not present

## 2022-03-22 DIAGNOSIS — Z79899 Other long term (current) drug therapy: Secondary | ICD-10-CM | POA: Diagnosis not present

## 2022-03-22 DIAGNOSIS — K219 Gastro-esophageal reflux disease without esophagitis: Secondary | ICD-10-CM | POA: Diagnosis not present

## 2022-03-24 ENCOUNTER — Other Ambulatory Visit: Payer: Self-pay | Admitting: Family Medicine

## 2022-03-24 DIAGNOSIS — I10 Essential (primary) hypertension: Secondary | ICD-10-CM

## 2022-03-26 ENCOUNTER — Encounter: Payer: Self-pay | Admitting: Cardiovascular Disease

## 2022-03-28 ENCOUNTER — Telehealth: Payer: Self-pay

## 2022-03-28 NOTE — Telephone Encounter (Signed)
Placed order for cardiac rehab.

## 2022-03-28 NOTE — Telephone Encounter (Signed)
-----   Message from Josue Hector, MD sent at 03/28/2022 12:55 PM EDT ----- Can we set up / put in referral for cardiac rehab post stent with ischemic DCM

## 2022-03-29 DIAGNOSIS — K219 Gastro-esophageal reflux disease without esophagitis: Secondary | ICD-10-CM | POA: Diagnosis not present

## 2022-03-29 DIAGNOSIS — D630 Anemia in neoplastic disease: Secondary | ICD-10-CM | POA: Diagnosis not present

## 2022-03-29 DIAGNOSIS — M25569 Pain in unspecified knee: Secondary | ICD-10-CM | POA: Diagnosis not present

## 2022-03-29 DIAGNOSIS — C7951 Secondary malignant neoplasm of bone: Secondary | ICD-10-CM | POA: Diagnosis not present

## 2022-03-29 DIAGNOSIS — R634 Abnormal weight loss: Secondary | ICD-10-CM | POA: Diagnosis not present

## 2022-03-29 DIAGNOSIS — Z905 Acquired absence of kidney: Secondary | ICD-10-CM | POA: Diagnosis not present

## 2022-03-29 DIAGNOSIS — M2550 Pain in unspecified joint: Secondary | ICD-10-CM | POA: Diagnosis not present

## 2022-03-29 DIAGNOSIS — Z5112 Encounter for antineoplastic immunotherapy: Secondary | ICD-10-CM | POA: Diagnosis not present

## 2022-03-29 DIAGNOSIS — C649 Malignant neoplasm of unspecified kidney, except renal pelvis: Secondary | ICD-10-CM | POA: Diagnosis not present

## 2022-03-29 DIAGNOSIS — M25521 Pain in right elbow: Secondary | ICD-10-CM | POA: Diagnosis not present

## 2022-03-29 DIAGNOSIS — Z79899 Other long term (current) drug therapy: Secondary | ICD-10-CM | POA: Diagnosis not present

## 2022-03-29 DIAGNOSIS — R002 Palpitations: Secondary | ICD-10-CM | POA: Diagnosis not present

## 2022-03-29 DIAGNOSIS — L02423 Furuncle of right upper limb: Secondary | ICD-10-CM | POA: Diagnosis not present

## 2022-03-29 DIAGNOSIS — R635 Abnormal weight gain: Secondary | ICD-10-CM | POA: Diagnosis not present

## 2022-03-29 DIAGNOSIS — D649 Anemia, unspecified: Secondary | ICD-10-CM | POA: Diagnosis not present

## 2022-03-29 DIAGNOSIS — M4800 Spinal stenosis, site unspecified: Secondary | ICD-10-CM | POA: Diagnosis not present

## 2022-03-29 DIAGNOSIS — M79646 Pain in unspecified finger(s): Secondary | ICD-10-CM | POA: Diagnosis not present

## 2022-04-09 ENCOUNTER — Other Ambulatory Visit: Payer: Self-pay | Admitting: Family Medicine

## 2022-04-09 DIAGNOSIS — I10 Essential (primary) hypertension: Secondary | ICD-10-CM

## 2022-04-12 DIAGNOSIS — R634 Abnormal weight loss: Secondary | ICD-10-CM | POA: Diagnosis not present

## 2022-04-12 DIAGNOSIS — M4800 Spinal stenosis, site unspecified: Secondary | ICD-10-CM | POA: Diagnosis not present

## 2022-04-12 DIAGNOSIS — M25569 Pain in unspecified knee: Secondary | ICD-10-CM | POA: Diagnosis not present

## 2022-04-12 DIAGNOSIS — K219 Gastro-esophageal reflux disease without esophagitis: Secondary | ICD-10-CM | POA: Diagnosis not present

## 2022-04-12 DIAGNOSIS — R197 Diarrhea, unspecified: Secondary | ICD-10-CM | POA: Diagnosis not present

## 2022-04-12 DIAGNOSIS — Z5111 Encounter for antineoplastic chemotherapy: Secondary | ICD-10-CM | POA: Diagnosis not present

## 2022-04-12 DIAGNOSIS — M25549 Pain in joints of unspecified hand: Secondary | ICD-10-CM | POA: Diagnosis not present

## 2022-04-12 DIAGNOSIS — R002 Palpitations: Secondary | ICD-10-CM | POA: Diagnosis not present

## 2022-04-12 DIAGNOSIS — D4819 Other specified neoplasm of uncertain behavior of connective and other soft tissue: Secondary | ICD-10-CM | POA: Diagnosis not present

## 2022-04-12 DIAGNOSIS — D649 Anemia, unspecified: Secondary | ICD-10-CM | POA: Diagnosis not present

## 2022-04-12 DIAGNOSIS — L739 Follicular disorder, unspecified: Secondary | ICD-10-CM | POA: Diagnosis not present

## 2022-04-12 DIAGNOSIS — L309 Dermatitis, unspecified: Secondary | ICD-10-CM | POA: Diagnosis not present

## 2022-04-12 DIAGNOSIS — M2550 Pain in unspecified joint: Secondary | ICD-10-CM | POA: Diagnosis not present

## 2022-04-12 DIAGNOSIS — C649 Malignant neoplasm of unspecified kidney, except renal pelvis: Secondary | ICD-10-CM | POA: Diagnosis not present

## 2022-04-12 DIAGNOSIS — D630 Anemia in neoplastic disease: Secondary | ICD-10-CM | POA: Diagnosis not present

## 2022-04-12 DIAGNOSIS — M25521 Pain in right elbow: Secondary | ICD-10-CM | POA: Diagnosis not present

## 2022-04-12 DIAGNOSIS — C7951 Secondary malignant neoplasm of bone: Secondary | ICD-10-CM | POA: Diagnosis not present

## 2022-04-17 ENCOUNTER — Encounter: Payer: Self-pay | Admitting: Family Medicine

## 2022-04-17 DIAGNOSIS — G893 Neoplasm related pain (acute) (chronic): Secondary | ICD-10-CM

## 2022-04-17 DIAGNOSIS — C641 Malignant neoplasm of right kidney, except renal pelvis: Secondary | ICD-10-CM

## 2022-04-18 DIAGNOSIS — M272 Inflammatory conditions of jaws: Secondary | ICD-10-CM | POA: Diagnosis not present

## 2022-04-18 DIAGNOSIS — M87180 Osteonecrosis due to drugs, jaw: Secondary | ICD-10-CM | POA: Diagnosis not present

## 2022-04-18 MED ORDER — HYDROCODONE-ACETAMINOPHEN 7.5-325 MG PO TABS: 2.0000 | ORAL_TABLET | ORAL | 0 refills | Status: DC

## 2022-04-20 DIAGNOSIS — N1831 Chronic kidney disease, stage 3a: Secondary | ICD-10-CM | POA: Diagnosis not present

## 2022-04-26 DIAGNOSIS — C7951 Secondary malignant neoplasm of bone: Secondary | ICD-10-CM | POA: Diagnosis not present

## 2022-04-26 DIAGNOSIS — C649 Malignant neoplasm of unspecified kidney, except renal pelvis: Secondary | ICD-10-CM | POA: Diagnosis not present

## 2022-04-26 DIAGNOSIS — M2550 Pain in unspecified joint: Secondary | ICD-10-CM | POA: Diagnosis not present

## 2022-04-26 DIAGNOSIS — D649 Anemia, unspecified: Secondary | ICD-10-CM | POA: Diagnosis not present

## 2022-04-26 DIAGNOSIS — R634 Abnormal weight loss: Secondary | ICD-10-CM | POA: Diagnosis not present

## 2022-04-26 DIAGNOSIS — K219 Gastro-esophageal reflux disease without esophagitis: Secondary | ICD-10-CM | POA: Diagnosis not present

## 2022-04-26 DIAGNOSIS — M25569 Pain in unspecified knee: Secondary | ICD-10-CM | POA: Diagnosis not present

## 2022-04-26 DIAGNOSIS — L309 Dermatitis, unspecified: Secondary | ICD-10-CM | POA: Diagnosis not present

## 2022-04-26 DIAGNOSIS — R002 Palpitations: Secondary | ICD-10-CM | POA: Diagnosis not present

## 2022-04-26 DIAGNOSIS — M4800 Spinal stenosis, site unspecified: Secondary | ICD-10-CM | POA: Diagnosis not present

## 2022-04-26 DIAGNOSIS — Z5111 Encounter for antineoplastic chemotherapy: Secondary | ICD-10-CM | POA: Diagnosis not present

## 2022-04-26 DIAGNOSIS — I499 Cardiac arrhythmia, unspecified: Secondary | ICD-10-CM | POA: Diagnosis not present

## 2022-04-26 DIAGNOSIS — M25549 Pain in joints of unspecified hand: Secondary | ICD-10-CM | POA: Diagnosis not present

## 2022-04-26 DIAGNOSIS — L739 Follicular disorder, unspecified: Secondary | ICD-10-CM | POA: Diagnosis not present

## 2022-04-26 DIAGNOSIS — D4819 Other specified neoplasm of uncertain behavior of connective and other soft tissue: Secondary | ICD-10-CM | POA: Diagnosis not present

## 2022-04-26 DIAGNOSIS — D630 Anemia in neoplastic disease: Secondary | ICD-10-CM | POA: Diagnosis not present

## 2022-04-26 DIAGNOSIS — M25521 Pain in right elbow: Secondary | ICD-10-CM | POA: Diagnosis not present

## 2022-04-27 DIAGNOSIS — E872 Acidosis, unspecified: Secondary | ICD-10-CM | POA: Diagnosis not present

## 2022-04-27 DIAGNOSIS — Z1509 Genetic susceptibility to other malignant neoplasm: Secondary | ICD-10-CM | POA: Diagnosis not present

## 2022-04-27 DIAGNOSIS — I129 Hypertensive chronic kidney disease with stage 1 through stage 4 chronic kidney disease, or unspecified chronic kidney disease: Secondary | ICD-10-CM | POA: Diagnosis not present

## 2022-04-27 DIAGNOSIS — N1831 Chronic kidney disease, stage 3a: Secondary | ICD-10-CM | POA: Diagnosis not present

## 2022-04-27 DIAGNOSIS — R809 Proteinuria, unspecified: Secondary | ICD-10-CM | POA: Diagnosis not present

## 2022-04-27 DIAGNOSIS — R69 Illness, unspecified: Secondary | ICD-10-CM | POA: Diagnosis not present

## 2022-04-27 DIAGNOSIS — I2510 Atherosclerotic heart disease of native coronary artery without angina pectoris: Secondary | ICD-10-CM | POA: Diagnosis not present

## 2022-05-04 ENCOUNTER — Other Ambulatory Visit: Payer: Self-pay | Admitting: Cardiovascular Disease

## 2022-05-05 DIAGNOSIS — Z1509 Genetic susceptibility to other malignant neoplasm: Secondary | ICD-10-CM | POA: Diagnosis not present

## 2022-05-05 DIAGNOSIS — R809 Proteinuria, unspecified: Secondary | ICD-10-CM | POA: Diagnosis not present

## 2022-05-10 DIAGNOSIS — Z79899 Other long term (current) drug therapy: Secondary | ICD-10-CM | POA: Diagnosis not present

## 2022-05-10 DIAGNOSIS — C649 Malignant neoplasm of unspecified kidney, except renal pelvis: Secondary | ICD-10-CM | POA: Diagnosis not present

## 2022-05-10 DIAGNOSIS — I2510 Atherosclerotic heart disease of native coronary artery without angina pectoris: Secondary | ICD-10-CM | POA: Diagnosis not present

## 2022-05-10 DIAGNOSIS — R002 Palpitations: Secondary | ICD-10-CM | POA: Diagnosis not present

## 2022-05-10 DIAGNOSIS — Z1509 Genetic susceptibility to other malignant neoplasm: Secondary | ICD-10-CM | POA: Diagnosis not present

## 2022-05-10 DIAGNOSIS — K219 Gastro-esophageal reflux disease without esophagitis: Secondary | ICD-10-CM | POA: Diagnosis not present

## 2022-05-10 DIAGNOSIS — M4802 Spinal stenosis, cervical region: Secondary | ICD-10-CM | POA: Diagnosis not present

## 2022-05-10 DIAGNOSIS — C7951 Secondary malignant neoplasm of bone: Secondary | ICD-10-CM | POA: Diagnosis not present

## 2022-05-10 DIAGNOSIS — M48061 Spinal stenosis, lumbar region without neurogenic claudication: Secondary | ICD-10-CM | POA: Diagnosis not present

## 2022-05-10 DIAGNOSIS — L739 Follicular disorder, unspecified: Secondary | ICD-10-CM | POA: Diagnosis not present

## 2022-05-10 DIAGNOSIS — M2550 Pain in unspecified joint: Secondary | ICD-10-CM | POA: Diagnosis not present

## 2022-05-10 DIAGNOSIS — D630 Anemia in neoplastic disease: Secondary | ICD-10-CM | POA: Diagnosis not present

## 2022-05-10 DIAGNOSIS — Z5111 Encounter for antineoplastic chemotherapy: Secondary | ICD-10-CM | POA: Diagnosis not present

## 2022-05-10 DIAGNOSIS — R197 Diarrhea, unspecified: Secondary | ICD-10-CM | POA: Diagnosis not present

## 2022-05-10 DIAGNOSIS — Z681 Body mass index (BMI) 19 or less, adult: Secondary | ICD-10-CM | POA: Diagnosis not present

## 2022-05-10 DIAGNOSIS — R634 Abnormal weight loss: Secondary | ICD-10-CM | POA: Diagnosis not present

## 2022-05-10 DIAGNOSIS — L309 Dermatitis, unspecified: Secondary | ICD-10-CM | POA: Diagnosis not present

## 2022-05-10 DIAGNOSIS — G893 Neoplasm related pain (acute) (chronic): Secondary | ICD-10-CM | POA: Diagnosis not present

## 2022-05-16 ENCOUNTER — Encounter: Payer: Self-pay | Admitting: Family Medicine

## 2022-05-16 DIAGNOSIS — C641 Malignant neoplasm of right kidney, except renal pelvis: Secondary | ICD-10-CM

## 2022-05-16 DIAGNOSIS — G893 Neoplasm related pain (acute) (chronic): Secondary | ICD-10-CM

## 2022-05-16 MED ORDER — HYDROCODONE-ACETAMINOPHEN 7.5-325 MG PO TABS: 2.0000 | ORAL_TABLET | ORAL | 0 refills | Status: DC

## 2022-05-24 DIAGNOSIS — Z5112 Encounter for antineoplastic immunotherapy: Secondary | ICD-10-CM | POA: Diagnosis not present

## 2022-05-24 DIAGNOSIS — K219 Gastro-esophageal reflux disease without esophagitis: Secondary | ICD-10-CM | POA: Diagnosis not present

## 2022-05-24 DIAGNOSIS — M79646 Pain in unspecified finger(s): Secondary | ICD-10-CM | POA: Diagnosis not present

## 2022-05-24 DIAGNOSIS — M4800 Spinal stenosis, site unspecified: Secondary | ICD-10-CM | POA: Diagnosis not present

## 2022-05-24 DIAGNOSIS — M25521 Pain in right elbow: Secondary | ICD-10-CM | POA: Diagnosis not present

## 2022-05-24 DIAGNOSIS — M2550 Pain in unspecified joint: Secondary | ICD-10-CM | POA: Diagnosis not present

## 2022-05-24 DIAGNOSIS — C649 Malignant neoplasm of unspecified kidney, except renal pelvis: Secondary | ICD-10-CM | POA: Diagnosis not present

## 2022-05-24 DIAGNOSIS — C7951 Secondary malignant neoplasm of bone: Secondary | ICD-10-CM | POA: Diagnosis not present

## 2022-05-24 DIAGNOSIS — D4819 Other specified neoplasm of uncertain behavior of connective and other soft tissue: Secondary | ICD-10-CM | POA: Diagnosis not present

## 2022-05-24 DIAGNOSIS — R634 Abnormal weight loss: Secondary | ICD-10-CM | POA: Diagnosis not present

## 2022-05-24 DIAGNOSIS — M25569 Pain in unspecified knee: Secondary | ICD-10-CM | POA: Diagnosis not present

## 2022-05-24 DIAGNOSIS — Z79899 Other long term (current) drug therapy: Secondary | ICD-10-CM | POA: Diagnosis not present

## 2022-05-24 DIAGNOSIS — Z79891 Long term (current) use of opiate analgesic: Secondary | ICD-10-CM | POA: Diagnosis not present

## 2022-05-24 DIAGNOSIS — R1310 Dysphagia, unspecified: Secondary | ICD-10-CM | POA: Diagnosis not present

## 2022-05-24 DIAGNOSIS — Z681 Body mass index (BMI) 19 or less, adult: Secondary | ICD-10-CM | POA: Diagnosis not present

## 2022-05-24 DIAGNOSIS — Z1509 Genetic susceptibility to other malignant neoplasm: Secondary | ICD-10-CM | POA: Diagnosis not present

## 2022-05-24 DIAGNOSIS — R002 Palpitations: Secondary | ICD-10-CM | POA: Diagnosis not present

## 2022-05-24 DIAGNOSIS — L739 Follicular disorder, unspecified: Secondary | ICD-10-CM | POA: Diagnosis not present

## 2022-05-24 DIAGNOSIS — D630 Anemia in neoplastic disease: Secondary | ICD-10-CM | POA: Diagnosis not present

## 2022-05-29 ENCOUNTER — Telehealth: Payer: Self-pay | Admitting: Gastroenterology

## 2022-05-29 NOTE — Telephone Encounter (Signed)
Inbound call from patient stating that he is a stage 4 cancer patient and he has been having issues with trouble swallowing. Patient stated that he has lost a lot of weight and is wanting a sooner appointment than January. Patient is requesting a call back to discuss. Please advise.

## 2022-05-29 NOTE — Telephone Encounter (Signed)
Returned call to patient. He states that he has been having difficulty swallowing and has lost weight. Pt states that he has a hx of cancer. Pt stated that he had difficulty swallowing water, I informed him that he would need to go to the ED for evaluation if he could not swallow liquids. I informed patient that Dr. Havery Moros does not have availability in the office until 06/2022. I offered patient an appt next week on Wednesday,06/08/23 with Jaclyn Shaggy, NP at 1:30 pm. Pt was hesitant to take this appt. I encouraged him to go ahead and schedule since appts slots are limited. Pt is concerned about his weight loss. He states that he already tries to drink Boost and Ensure, it takes him a while to eat solid foods. Pt wanted to know what we did in emergency situations and I informed him that we direct patient to the ED. I informed patient that Dr. Havery Moros is the hospital provider this week. I told pt that if his symptoms worsen in the interim he will need to go to ED for evaluation, and they can request a GI consult and if necessary Dr. Havery Moros would possibly set him up for an EGD. Pt is concerned about going to the ED because his wife had a recent TBI. Pt is still on Pantoprazole 20 mg daily and he can't take an increased dose due to interacting with oncology medication and their absorption rate. I repeated all recommendations to patient. We scheduled the follow up for next week and he knows to go to the ED for emergency care if necessary. Pt verbalized understanding and had no concerns at the end of the call.

## 2022-05-31 DIAGNOSIS — R1319 Other dysphagia: Secondary | ICD-10-CM | POA: Diagnosis not present

## 2022-05-31 DIAGNOSIS — R1310 Dysphagia, unspecified: Secondary | ICD-10-CM | POA: Diagnosis not present

## 2022-06-07 ENCOUNTER — Ambulatory Visit: Payer: Medicare HMO | Admitting: Nurse Practitioner

## 2022-06-07 DIAGNOSIS — M25569 Pain in unspecified knee: Secondary | ICD-10-CM | POA: Diagnosis not present

## 2022-06-07 DIAGNOSIS — L739 Follicular disorder, unspecified: Secondary | ICD-10-CM | POA: Diagnosis not present

## 2022-06-07 DIAGNOSIS — M2550 Pain in unspecified joint: Secondary | ICD-10-CM | POA: Diagnosis not present

## 2022-06-07 DIAGNOSIS — M25549 Pain in joints of unspecified hand: Secondary | ICD-10-CM | POA: Diagnosis not present

## 2022-06-07 DIAGNOSIS — Z79899 Other long term (current) drug therapy: Secondary | ICD-10-CM | POA: Diagnosis not present

## 2022-06-07 DIAGNOSIS — R002 Palpitations: Secondary | ICD-10-CM | POA: Diagnosis not present

## 2022-06-07 DIAGNOSIS — M25521 Pain in right elbow: Secondary | ICD-10-CM | POA: Diagnosis not present

## 2022-06-07 DIAGNOSIS — C7951 Secondary malignant neoplasm of bone: Secondary | ICD-10-CM | POA: Diagnosis not present

## 2022-06-07 DIAGNOSIS — R1310 Dysphagia, unspecified: Secondary | ICD-10-CM | POA: Diagnosis not present

## 2022-06-07 DIAGNOSIS — Z5112 Encounter for antineoplastic immunotherapy: Secondary | ICD-10-CM | POA: Diagnosis not present

## 2022-06-07 DIAGNOSIS — Z1509 Genetic susceptibility to other malignant neoplasm: Secondary | ICD-10-CM | POA: Diagnosis not present

## 2022-06-07 DIAGNOSIS — M4800 Spinal stenosis, site unspecified: Secondary | ICD-10-CM | POA: Diagnosis not present

## 2022-06-07 DIAGNOSIS — Z79891 Long term (current) use of opiate analgesic: Secondary | ICD-10-CM | POA: Diagnosis not present

## 2022-06-07 DIAGNOSIS — C649 Malignant neoplasm of unspecified kidney, except renal pelvis: Secondary | ICD-10-CM | POA: Diagnosis not present

## 2022-06-07 DIAGNOSIS — Z9889 Other specified postprocedural states: Secondary | ICD-10-CM | POA: Diagnosis not present

## 2022-06-07 DIAGNOSIS — D4819 Other specified neoplasm of uncertain behavior of connective and other soft tissue: Secondary | ICD-10-CM | POA: Diagnosis not present

## 2022-06-07 DIAGNOSIS — D630 Anemia in neoplastic disease: Secondary | ICD-10-CM | POA: Diagnosis not present

## 2022-06-07 DIAGNOSIS — R634 Abnormal weight loss: Secondary | ICD-10-CM | POA: Diagnosis not present

## 2022-06-07 DIAGNOSIS — K219 Gastro-esophageal reflux disease without esophagitis: Secondary | ICD-10-CM | POA: Diagnosis not present

## 2022-06-07 DIAGNOSIS — D649 Anemia, unspecified: Secondary | ICD-10-CM | POA: Diagnosis not present

## 2022-06-07 DIAGNOSIS — C641 Malignant neoplasm of right kidney, except renal pelvis: Secondary | ICD-10-CM | POA: Diagnosis not present

## 2022-06-09 DIAGNOSIS — R1319 Other dysphagia: Secondary | ICD-10-CM | POA: Diagnosis not present

## 2022-06-09 DIAGNOSIS — C159 Malignant neoplasm of esophagus, unspecified: Secondary | ICD-10-CM | POA: Diagnosis not present

## 2022-06-09 DIAGNOSIS — K2210 Ulcer of esophagus without bleeding: Secondary | ICD-10-CM | POA: Diagnosis not present

## 2022-06-13 ENCOUNTER — Encounter: Payer: Self-pay | Admitting: Family Medicine

## 2022-06-13 DIAGNOSIS — C641 Malignant neoplasm of right kidney, except renal pelvis: Secondary | ICD-10-CM

## 2022-06-13 DIAGNOSIS — G893 Neoplasm related pain (acute) (chronic): Secondary | ICD-10-CM

## 2022-06-13 DIAGNOSIS — F329 Major depressive disorder, single episode, unspecified: Secondary | ICD-10-CM

## 2022-06-14 MED ORDER — HYDROCODONE-ACETAMINOPHEN 7.5-325 MG PO TABS: 2.0000 | ORAL_TABLET | ORAL | 0 refills | Status: DC

## 2022-06-14 MED ORDER — LORAZEPAM 1 MG PO TABS: 0.5000 mg | ORAL_TABLET | Freq: Every evening | ORAL | 3 refills | Status: DC | PRN

## 2022-06-15 DIAGNOSIS — Z5111 Encounter for antineoplastic chemotherapy: Secondary | ICD-10-CM | POA: Diagnosis not present

## 2022-06-15 DIAGNOSIS — C7951 Secondary malignant neoplasm of bone: Secondary | ICD-10-CM | POA: Diagnosis not present

## 2022-06-15 DIAGNOSIS — C649 Malignant neoplasm of unspecified kidney, except renal pelvis: Secondary | ICD-10-CM | POA: Diagnosis not present

## 2022-06-20 ENCOUNTER — Encounter: Payer: Self-pay | Admitting: Family Medicine

## 2022-06-20 ENCOUNTER — Telehealth: Payer: Self-pay | Admitting: Gastroenterology

## 2022-06-20 DIAGNOSIS — C155 Malignant neoplasm of lower third of esophagus: Secondary | ICD-10-CM

## 2022-06-20 DIAGNOSIS — Z1509 Genetic susceptibility to other malignant neoplasm: Secondary | ICD-10-CM

## 2022-06-20 NOTE — Telephone Encounter (Signed)
Thanks. GM 

## 2022-06-20 NOTE — Telephone Encounter (Signed)
I have reviewed the patient's chart as it looks like he has metastatic RCC with hepatic/peritoneal metastases. Previously a patient of Dr. Havery Moros but not able to be seen in December so patient ended up going to a [facility] GI provider Dr. 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 I cannot see any Oncology updated records since 12/20 (before his EGD). In most cases of esophagus cancer, patients should undergo updated staging scans (PET-CT) most often to evaluate for staging. If in patients who have low-staging, then EUS, can be considered in some circumstances to decide overall T staging and subsequently, give that information back to Oncology/Thoracic Surgery and determine if patient is a candidate for Upfront Surgery vs Chemo/XRT prior to surgery or if he is a non-surgical candidate upfront. Based on his prior history of metastatic RCC, I suspect that surgery will not be an option. Esophageal stenting is not performed, except in circumstances that patients are not surgical candidates and GI has been given the OK from Oncology/Radiation Oncology/Thoracic surgery as radiation issues can make things more difficult or allow tumors to shrink and then patients get complications of stent migration.  Thus I would not be able to help with this with this current information that we have currently. In most circumstances, patient's Oncology team will arrange a PEG-tube to be placed (GI will not be able to place this ourselves since we would have to do a pull technique and there may be risk of tumor seeding).  Thus Interventional Radiology is most often the team that will be needed and we can place referral if that is what they want.  Thus in this case, there is more information that is needed: 1) what is patient's status from an Oncology standpoint in regards to updated staging and thoughts on therapy 2) EUS can be considered, but only after PET-CT is completed so that would  need to be arranged by Oncology but if he is a non-surgical candidate, then it is moot point really 3) If feeding tube/nutrition is the necessity then placement of referral to Interventional Radiology will be required and would be upfront what to do prior to esophageal stenting 4) As patient has already established Oncology Care with [facility], and now has a GI provider there who knows his current GI cancer history, the patient can also have all of his care done through [facility]  Let me now what Oncology has to say to this and if the patient just wants to follow up with GI through [facility]. GM

## 2022-06-20 NOTE — Telephone Encounter (Signed)
Dr. Rush Landmark,  Please see referral for stent placement.  Thank you, Colletta [place]

## 2022-06-20 NOTE — Telephone Encounter (Signed)
Thanks for reaching out. I know this patient from a prior colonoscopy but unfortunately he could not wait to be seen when he called our office for this more recent issue and had GI care elsewhere.  Main question is where does he want to receive his care at this point - for both GI and oncology, and what is his plan for his malignancy as Dr. Rush Landmark outlined.  [place] can you please contact this patient to let him know Dr. Donneta Romberg thoughts which I agree with. If he needs a feed tube, which seems to be the case, we can refer him to IR to have that done. As to the question of a stent, his malignancy plan per oncology needs to be clarified first to determine if that is warranted or not. If he wants to follow up with his other GI whom he has recently seen for this that is okay, he just needs to let us know either way where he wants to receive care. I am not sure if he is seeing Oncology with their group or with [facility]. Can you let us know? Thanks

## 2022-06-21 NOTE — Telephone Encounter (Signed)
Okay thank you [place], appreciate your help. Will await his Oncology visit. If they think he would benefit from a stent placement after his visit with them he should contact us for reassessment. If they have other needs from our practice he can contact us. Thanks

## 2022-06-21 NOTE — Telephone Encounter (Signed)
Called and spoke with patient, he is very pleasant. He states that he would like feeding tube placed as he is not able to eat much and losing weight. Pt would like to have GI care here in [place], he states that he only went to [facility] because they were able to get him in for a sooner appt back in December. Pt states that he has not seen his oncologist yet because they are out of town. I informed patient that IR would complete evaluation and assessment and set up feeding tube placement if appropriate. Patient states that his wife had brain surgery and it was be easier on her if he was able to have feeding tube placed in [place]. I informed patient that we will place the referral to IR and he knows to expect a call from IR directly to set up his appt. Pt is happy with his oncologist at [facility] and would like to continue care there. Pt had no other concerns at the end of the call.  IR evaluation and management and order in epic.

## 2022-06-21 NOTE — Telephone Encounter (Signed)
Noted, thanks!

## 2022-06-21 NOTE — Addendum Note (Signed)
Addended by: Yevette Edwards on: 06/21/2022 03:42 PM   Modules accepted: Orders

## 2022-06-22 ENCOUNTER — Ambulatory Visit (INDEPENDENT_AMBULATORY_CARE_PROVIDER_SITE_OTHER): Payer: Medicare HMO | Admitting: Family Medicine

## 2022-06-22 VITALS — BP 112/60 | HR 97 | Temp 97.6°F | Resp 18 | Ht 73.0 in | Wt 139.4 lb

## 2022-06-22 DIAGNOSIS — Z636 Dependent relative needing care at home: Secondary | ICD-10-CM | POA: Diagnosis not present

## 2022-06-22 DIAGNOSIS — R69 Illness, unspecified: Secondary | ICD-10-CM | POA: Diagnosis not present

## 2022-06-22 DIAGNOSIS — R634 Abnormal weight loss: Secondary | ICD-10-CM | POA: Diagnosis not present

## 2022-06-22 DIAGNOSIS — F329 Major depressive disorder, single episode, unspecified: Secondary | ICD-10-CM | POA: Diagnosis not present

## 2022-06-22 DIAGNOSIS — C159 Malignant neoplasm of esophagus, unspecified: Secondary | ICD-10-CM | POA: Diagnosis not present

## 2022-06-22 MED ORDER — CARIPRAZINE HCL 1.5 MG PO CAPS: 1.5000 mg | ORAL_CAPSULE | Freq: Every day | ORAL | 2 refills | Status: DC

## 2022-06-22 NOTE — Patient Instructions (Addendum)
It was good to see you again today- I am sorry you are having such a hard time!  I do think a feeding tube is a reasonable idea for you if you wish to continue treatment For depression- let's try adding Vralyar to your regimen. Please let me know if your insurance will pay for this and if so how it seems to work for you over the next few weeks

## 2022-06-22 NOTE — Progress Notes (Signed)
[place] at [facility] [address], [place], [place] [address] [phone]  Date:  06/22/2022   Name:  Douglas [name]   DOB:  17-Aug-1955   MRN:  [id number]  PCP:  Darreld Mclean, MD    Chief Complaint: discuss feeding tube (Concerns/ questions: Depression/)   History of Present Illness:  Douglas [name] is a 67 y.o. very pleasant male patient who presents with the following:  Patient seen today for follow-up.  Unfortunately Douglas [name] has metastatic leiomyomatosis and renal cell cancer, recently diagnosed with cancer of the esophagus-I am not sure this is metastatic or original.  He has recently had a lot more difficulty eating and would like to discuss potentially getting a feeding tube  His lead oncologist at this time is Douglas [name] with [facility].  He had previously been part of clinical trials at [facility] Per recent hematology note, 12/20 Douglas [name] is a 67 y.o. with hereditary leiomyomatosis and renal cell cancer who was treated for 5 years since diagnosis on a clinical trial at the [facility] with a combination of bevacizumab and erlotinib. Due to disease progression, he has been started on immunotherapy with PD-L1 inhibitor Nivolumab since April 2022 by his primary medical oncologist. He received Nivolumab dose of 240 mg once every 2 weeks. He is also receiving rank ligand inhibitor therapy denosumab 120 mg once every 4 weeks. He completed Day 1, Cycle 25 of Opdivo on 09/06/2021. Patient started Cabozantinib on 10/13/21. He also completed Cycle 9 of Xgeva on 10/13/21. Stopped his immunotherapy due to complications with his health. CT done on 01/24/22 showed progression of cancer. Started Afinitor in August 2023.   Patient will start chemotherapy called Tarceva and Avastin. Patient Started first cycle on 03/29/2022. Patient completed Day 1, Cycle 5 on 05/24/2022. Patient is due for Day 1, Cycle 6 in 06/14/2022. (Bevacizumab IV 10/mg every  2 weeks and erlotinib 150 mg once daily.)   He has typically been followed by [facility] gastroenterology, Douglas [name].  However he ended up being seen by Douglas [name] with [facility] on 12/22 due to severe odynophagia-they performed an EGD which showed likely cancer in the esophagus ESOPHAGUS, ENDOSCOPIC BIOPSY: GASTRIC MUCOSA WITH POORLY DIFFERENTIATED MUCINOUS ADENOCARCINOMA.  Douglas [name] has been using prilosec and notes this is helping him some - his pain with eating is somewhat improved and his p.o. intake is somewhat better.  However, his weight is significantly lower than what he considers to be normal  Wt Readings from Last 3 Encounters:  06/22/22 139 lb 6.4 oz (63.2 kg)  02/03/22 156 lb (70.8 kg)  12/28/21 151 lb (68.5 kg)   It takes him a really long time to eat which also limits his nutrition At this point Douglas [name] is not ready to stop cancer treatment, he worries about his wife and wants to keep trying.  In this case, I suggested that a feeding tube may be appropriate for him.  However, certainly this is not my area of expertise  Douglas [name] notes he is struggling a lot with depression.  He feels like he wakes up every morning and reality comes crashing down on him.  He is really worried about his wife Douglas [name], he notes that she seems to have more of a flat affect and to not be able to make plans or do much for herself.  He really worries about what will happen when he is gone  He is currently using Cymbalta and  trazodone We have also previously tried Wellbutrin and Paxil We discussed potentially adding a low-dose of adjunctive atypical antipsychotic for resistant depression symptoms.  He would like to give this a try Patient Active Problem List   Diagnosis Date Noted   Dermatitis 12/07/2020   High risk medication use 09/21/2020   NSTEMI (non-ST elevated myocardial infarction) ([place]) 05/24/2020   Acute ST elevation myocardial infarction (STEMI) ([place])    Bone metastases 07/26/2016   Bone  lesion 06/02/2014   CAD (coronary artery disease) 03/25/2014   Protein-calorie malnutrition, severe ([place]) 10/17/2013   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) 08/20/2012   Renal calculi    GERD (gastroesophageal reflux disease)    Allergic rhinitis    Osteoarthritis    Encounter for long-term (current) use of other medications    Hyperlipidemia    Tobacco use disorder    COPD (chronic obstructive pulmonary disease) ([place])    Unspecified vitamin D deficiency    Hypertension     Past Medical History:  Diagnosis Date   Allergic rhinitis    Anxiety    Arthritis    "all over my body" (10/16/2013)   Cataract    Chronic back pain    "neck to lower back" (10/16/2013)   Colon polyps    Compression fracture    S/P L-SPINE; pt does not recall this hx on 10/16/2013   COPD (chronic obstructive pulmonary disease) (HCC)    FVC .66   Coronary artery disease    Depression    "situational since 09/22/2013 OR"   Encounter for long-term (current) use of other medications    GERD (gastroesophageal reflux disease)    Hemoptysis    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)    History of blood transfusion 09/2013   "think so; not sure; related to big OR"   History of surgical fusion joint    DDD C-SPINE   Hx of blood clots    Hyperlipidemia    Hypertension    Hypertensive retinopathy    Leiomyoma OF SKIN   INCREASED RISK RENAL CELL CA  NEEDS CT OF KIDNEYS EVERY 2 YEARS NEXT DUE  02/06/13   Melanoma in situ ([place])    Melanoma of back ([place])    Neuromuscular disorder ([place])    Osteoarthritis    Renal calculi    Renal cell carcinoma ([place])    type 2; papillary (metastized to bone)   Tobacco use disorder    Unspecified vitamin D deficiency     Past Surgical History:  Procedure Laterality Date   ANTERIOR CERVICAL DECOMP/DISCECTOMY FUSION  1992   ANTERIOR CRUCIATE LIGAMENT REPAIR Bilateral [phone]   APPENDECTOMY  09/22/2013   CHOLECYSTECTOMY   09/22/2013   "had tumors in it"   CORONARY STENT INTERVENTION N/A 05/24/2020   Procedure: CORONARY STENT INTERVENTION;  Surgeon: Martinique, Peter M, MD;  Location: [facility];  Service: Cardiovascular;  Laterality: N/A;   HAND LIGAMENT RECONSTRUCTION Right    HARVEST BONE GRAFT Right 1992   "hip; for neck fusion"   INGUINAL HERNIA REPAIR Right 2004   INGUINAL HERNIA REPAIR Left 12/28/2021   Procedure: OPEN LEFT INGUINAL HERNIA REPAIR;  Surgeon: Coralie Keens, MD;  Location: [facility];  Service: General;  Laterality: Left;   INSERTION OF MESH Left 12/28/2021   Procedure: INSERTION OF MESH;  Surgeon: Coralie Keens, MD;  Location: [facility];  Service: General;  Laterality: Left;   LEFT HEART CATH AND CORONARY ANGIOGRAPHY N/A 05/24/2020  Procedure: LEFT HEART CATH AND CORONARY ANGIOGRAPHY;  Surgeon: Martinique, Peter M, MD;  Location: [facility];  Service: Cardiovascular;  Laterality: N/A;   LIGAMENT REPAIR Right ~ 2001   "little finger"   LYMPH NODE DISSECTION Right 09/22/2013   "in the area of the kidney; they were clean"   MELANOMA EXCISION  ~ 2007   "off my back"   PARTIAL NEPHRECTOMY Right 09/22/2013   removal of fascia overlying the right psoas Right 09/22/2013   RENAL ARTERY STENT Right 09/22/2013   RIGHT COLECTOMY Right 09/22/2013   ROBOTIC ASSITED PARTIAL NEPHRECTOMY  05/22/2012   Procedure: ROBOTIC ASSITED PARTIAL NEPHRECTOMY;  Surgeon: Alexis Frock, MD;  Location: [facility];  Service: Urology;  Laterality: Right;  Right Robotic Cyst Decortication and Partial Nephrectomy     Social History   Tobacco Use   Smoking status: Former    Packs/day: 0.50    Years: 40.00    Total pack years: 20.00    Types: Cigarettes    Quit date: 2017    Years since quitting: 7.0   Smokeless tobacco: Never  Vaping Use   Vaping Use: Never used  Substance Use Topics   Alcohol use: Not Currently   Drug use: Yes    Types: Marijuana    Comment: smokes every day    Family History   Adopted: Yes  Problem Relation Age of Onset   Blindness Mother    Glaucoma Mother    Hypertension Father     Allergies  Allergen Reactions   Avelox [Moxifloxacin Hcl In Nacl] Hives, Shortness Of Breath and Swelling   Enbucrilate Hives and Rash    DERMABOND   Tape Dermatitis and Rash    Just the glue    Medication list has been reviewed and updated.  Current Outpatient Medications on File Prior to Visit  Medication Sig Dispense Refill   aspirin EC 81 MG tablet Take 1 tablet (81 mg total) by mouth daily. Swallow whole.     B Complex Vitamins (B COMPLEX 100 PO) Take 100 mg by mouth daily.     Bevacizumab (AVASTIN IV) Inject into the vein every 14 (fourteen) days.     Calcium Carb-Cholecalciferol (CALCIUM 600/VITAMIN D PO) Take 1 capsule by mouth daily.     carvedilol (COREG) 3.125 MG tablet TAKE 1 TABLET BY MOUTH TWICE A DAY WITH FOOD 180 tablet 2   Cetirizine-Pseudoephedrine (ZYRTEC-D PO) Take by mouth.     cholecalciferol (VITAMIN D3) 25 MCG (1000 UNIT) tablet Take 2,000 Units by mouth daily.     DULoxetine (CYMBALTA) 20 MG capsule TAKE ONE CAPSULE BY MOUTH DAILY 90 capsule 3   erlotinib (TARCEVA) 150 MG tablet Take 150 mg by mouth daily. Take on an empty stomach 1 hour before meals or 2 hours after.     HYDROcodone-acetaminophen (NORCO) 7.5-325 MG tablet Take 2 tablets by mouth See admin instructions. Take 2 every 4 hours through out the day until taking ativan at bedtime, do not mix 260 tablet 0   loperamide (IMODIUM A-D) 2 MG tablet Take 2 mg by mouth 4 (four) times daily as needed for diarrhea or loose stools.     LORazepam (ATIVAN) 1 MG tablet Take 0.5-1 tablets (0.5-1 mg total) by mouth at bedtime as needed for sleep. TAKE ONE HALF OR ONE TABLET BY MOUTH AT BEDTIME IF NEEDED FOR SLEEP, DO NOT TAKE WITH PAIN MEDS 30 tablet 3   Multiple Vitamins-Minerals (CENTRUM SILVER 50+MEN PO) Take 1 tablet by mouth daily.  naloxone (NARCAN) nasal spray 4 mg/0.1 mL Use one spray in  nostril every 2-3 minutes as needed for drug overdose (cancer patient on opioid therapy) 2 each PRN   nitroGLYCERIN (NITROSTAT) 0.4 MG SL tablet Place 1 tablet (0.4 mg total) under the tongue every 5 (five) minutes x 3 doses as needed for chest pain. Please dispense #25 x 4 bottle 100 tablet 1   omeprazole (PRILOSEC) 40 MG capsule Take 40 mg by mouth daily.     ondansetron (ZOFRAN-ODT) 4 MG disintegrating tablet TAKE 1 TABLET BY MOUTH SUBLINGUALLY EVERY 8 HOURS AS NEEDED FOR NAUSEA OR VOMITING 40 tablet 2   Polyethyl Glycol-Propyl Glycol (SYSTANE OP) Place 1 drop into both eyes 2 (two) times daily as needed (dry eyes).     rosuvastatin (CRESTOR) 10 MG tablet TAKE 1 TABLET BY MOUTH EVERY DAY 90 tablet 2   sacubitril-valsartan (ENTRESTO) 24-26 MG Take 1 tablet by mouth 2 (two) times daily. 180 tablet 3   sodium bicarbonate 650 MG tablet Take 650 mg by mouth daily.     sodium chloride (OCEAN) 0.65 % SOLN nasal spray Place 1 spray into both nostrils at bedtime.     traZODone (DESYREL) 50 MG tablet Take 25 mg by mouth at bedtime.     vitamin C (ASCORBIC ACID) 500 MG tablet Take 500 mg by mouth daily.     No current facility-administered medications on file prior to visit.    Review of Systems:  As per HPI- otherwise negative.   Physical Examination: Vitals:   06/22/22 1429  BP: 112/60  Pulse: 97  Resp: 18  Temp: 97.6 F (36.4 C)  SpO2: 99%   Vitals:   06/22/22 1429  Weight: 139 lb 6.4 oz (63.2 kg)  Height: _0  (1.854 m)   Body mass index is 18.39 kg/m. Ideal Body Weight: Weight in (lb) to have BMI = 25: 189.1  GEN: no acute distress.  Thin but not cachectic.  Has a good energy level and looks better than I expected today.  He is sometimes tearful HEENT: Atraumatic, Normocephalic.  Ears and Nose: No external deformity. CV: RRR, No M/G/R. No JVD. No thrill. No extra heart sounds. PULM: CTA B, no wheezes, crackles, rhonchi. No retractions. No resp. distress. No accessory muscle  use. ABD: S, NT, ND EXTR: No c/c/e PSYCH: Normally interactive. Conversant.    Assessment and Plan: Weight loss  Malignant neoplasm of esophagus, unspecified location ([place])  Reactive depression - Plan: cariprazine (VRAYLAR) 1.5 MG capsule  Caregiver stress  Patient seen today to discuss weight loss and potentially getting a feeding tube.  At this point he does not wish to stop cancer treatment or consider hospice.  He has promised Douglas [name] to "keep fighting".  I advised him that certainly we support him in his choice.  However I did remind him that if at some point he wishes to stop treatment for terminal cancer that is also okay.  He is seeing his oncologist on Monday and they will discuss next steps towards getting a feeding tube. Unfortunately Douglas [name] is under a fair amount of stress from his wife's condition as well.  She suffers from mental illness as well as likely some neurologic changes, previous history of brain tumor.  I encouraged them to look into neuropsychiatric testing for her.  I also encouraged them to discuss plans for [facility] when Douglas [name] does eventually pass away    We decided to try adding 1.5 mg of the atypical antipsychotic Vraylar-he can continue  his Cymbalta and trazodone.  He will update me in a couple of weeks regarding how this is working for his depression  Signed Lamar Blinks, MD

## 2022-06-25 ENCOUNTER — Encounter: Payer: Self-pay | Admitting: Family Medicine

## 2022-06-25 DIAGNOSIS — F329 Major depressive disorder, single episode, unspecified: Secondary | ICD-10-CM

## 2022-06-26 ENCOUNTER — Ambulatory Visit: Payer: Medicare HMO | Admitting: Family Medicine

## 2022-06-26 DIAGNOSIS — C649 Malignant neoplasm of unspecified kidney, except renal pelvis: Secondary | ICD-10-CM | POA: Diagnosis not present

## 2022-06-26 DIAGNOSIS — Z79899 Other long term (current) drug therapy: Secondary | ICD-10-CM | POA: Diagnosis not present

## 2022-06-26 DIAGNOSIS — D4819 Other specified neoplasm of uncertain behavior of connective and other soft tissue: Secondary | ICD-10-CM | POA: Diagnosis not present

## 2022-06-26 DIAGNOSIS — L739 Follicular disorder, unspecified: Secondary | ICD-10-CM | POA: Diagnosis not present

## 2022-06-26 DIAGNOSIS — D649 Anemia, unspecified: Secondary | ICD-10-CM | POA: Diagnosis not present

## 2022-06-26 DIAGNOSIS — K219 Gastro-esophageal reflux disease without esophagitis: Secondary | ICD-10-CM | POA: Diagnosis not present

## 2022-06-26 DIAGNOSIS — M25521 Pain in right elbow: Secondary | ICD-10-CM | POA: Diagnosis not present

## 2022-06-26 DIAGNOSIS — D630 Anemia in neoplastic disease: Secondary | ICD-10-CM | POA: Diagnosis not present

## 2022-06-26 DIAGNOSIS — R1310 Dysphagia, unspecified: Secondary | ICD-10-CM | POA: Diagnosis not present

## 2022-06-26 DIAGNOSIS — R634 Abnormal weight loss: Secondary | ICD-10-CM | POA: Diagnosis not present

## 2022-06-26 DIAGNOSIS — M4800 Spinal stenosis, site unspecified: Secondary | ICD-10-CM | POA: Diagnosis not present

## 2022-06-26 DIAGNOSIS — R002 Palpitations: Secondary | ICD-10-CM | POA: Diagnosis not present

## 2022-06-26 DIAGNOSIS — C7951 Secondary malignant neoplasm of bone: Secondary | ICD-10-CM | POA: Diagnosis not present

## 2022-06-26 DIAGNOSIS — Z5112 Encounter for antineoplastic immunotherapy: Secondary | ICD-10-CM | POA: Diagnosis not present

## 2022-06-26 MED ORDER — OLANZAPINE 5 MG PO TABS: 5.0000 mg | ORAL_TABLET | Freq: Every day | ORAL | 3 refills | Status: DC

## 2022-06-27 ENCOUNTER — Ambulatory Visit
Admission: RE | Admit: 2022-06-27 | Discharge: 2022-06-27 | Disposition: A | Discharge status: Home / Self Care | Payer: Medicare HMO | Source: Ambulatory Visit | Attending: Gastroenterology | Admitting: Gastroenterology

## 2022-06-27 DIAGNOSIS — C155 Malignant neoplasm of lower third of esophagus: Secondary | ICD-10-CM

## 2022-06-27 DIAGNOSIS — Z1509 Genetic susceptibility to other malignant neoplasm: Secondary | ICD-10-CM

## 2022-06-27 DIAGNOSIS — R1310 Dysphagia, unspecified: Secondary | ICD-10-CM | POA: Diagnosis not present

## 2022-06-27 DIAGNOSIS — C159 Malignant neoplasm of esophagus, unspecified: Secondary | ICD-10-CM | POA: Diagnosis not present

## 2022-06-27 DIAGNOSIS — R634 Abnormal weight loss: Secondary | ICD-10-CM | POA: Diagnosis not present

## 2022-06-27 NOTE — Consult Note (Signed)
Chief Complaint:  Newly diagnosed esophageal cancer, dysphagia, weight loss  Referring Physician(s): Armbruster,Demarquez P  History of Present Illness: Douglas [name] is a 67 y.o. male Who has a rare hereditary leiomyomatosis and metastatic renal cell carcinoma.  He has extensive known liver metastases and is receiving chemotherapy at [place] with Dr. Wendee Beavers.  Recently he had dysphagia.  This required EGD and biopsy of a mid esophageal ulcerated mass which revealed esophageal adenocarcinoma.  He now is experiencing some difficulty with eating/nutrition resulting in weight loss.  He continues to have a chemotherapy at the [facility].  He presents today to discuss fluoroscopic gastrostomy placement.  Most recent imaging is from September nearly 3 months ago.  He does have an enlarged liver with extensive bi lobar metastatic disease.  Stomach is collapsed.  No hiatal hernia.  Transverse colon is inferior to the stomach.  He may have a small window to place a fluoroscopic G-tube below the enlarged liver once the stomach is insufflated with air.  Because of his ongoing dysphagia and known ulcerated esophageal mass I would favor attempt at a push in balloon retention gastrostomy rather than a pull-through G-tube.  Past Medical History:  Diagnosis Date   Allergic rhinitis    Anxiety    Arthritis    "all over my body" (10/16/2013)   Cataract    Chronic back pain    "neck to lower back" (10/16/2013)   Colon polyps    Compression fracture    S/P L-SPINE; pt does not recall this hx on 10/16/2013   COPD (chronic obstructive pulmonary disease) (HCC)    FVC .66   Coronary artery disease    Depression    "situational since 09/22/2013 OR"   Encounter for long-term (current) use of other medications    GERD (gastroesophageal reflux disease)    Hemoptysis    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)    History of blood transfusion 09/2013    "think so; not sure; related to big OR"   History of surgical fusion joint    DDD C-SPINE   Hx of blood clots    Hyperlipidemia    Hypertension    Hypertensive retinopathy    Leiomyoma OF SKIN   INCREASED RISK RENAL CELL CA  NEEDS CT OF KIDNEYS EVERY 2 YEARS NEXT DUE  02/06/13   Melanoma in situ ([place])    Melanoma of back ([place])    Neuromuscular disorder ([place])    Osteoarthritis    Renal calculi    Renal cell carcinoma ([place])    type 2; papillary (metastized to bone)   Tobacco use disorder    Unspecified vitamin D deficiency     Past Surgical History:  Procedure Laterality Date   ANTERIOR CERVICAL DECOMP/DISCECTOMY FUSION  1992   ANTERIOR CRUCIATE LIGAMENT REPAIR Bilateral [phone]   APPENDECTOMY  09/22/2013   CHOLECYSTECTOMY  09/22/2013   "had tumors in it"   CORONARY STENT INTERVENTION N/A 05/24/2020   Procedure: CORONARY STENT INTERVENTION;  Surgeon: Martinique, Peter M, MD;  Location: [facility];  Service: Cardiovascular;  Laterality: N/A;   HAND LIGAMENT RECONSTRUCTION Right    HARVEST BONE GRAFT Right 1992   "hip; for neck fusion"   INGUINAL HERNIA REPAIR Right 2004   INGUINAL HERNIA REPAIR Left 12/28/2021   Procedure: OPEN LEFT INGUINAL HERNIA REPAIR;  Surgeon: Coralie Keens, MD;  Location: [facility];  Service: General;  Laterality: Left;  INSERTION OF MESH Left 12/28/2021   Procedure: INSERTION OF MESH;  Surgeon: Coralie Keens, MD;  Location: [facility];  Service: General;  Laterality: Left;   LEFT HEART CATH AND CORONARY ANGIOGRAPHY N/A 05/24/2020   Procedure: LEFT HEART CATH AND CORONARY ANGIOGRAPHY;  Surgeon: Martinique, Peter M, MD;  Location: [facility];  Service: Cardiovascular;  Laterality: N/A;   LIGAMENT REPAIR Right ~ 2001   "little finger"   LYMPH NODE DISSECTION Right 09/22/2013   "in the area of the kidney; they were clean"   MELANOMA EXCISION  ~ 2007   "off my back"   PARTIAL NEPHRECTOMY Right 09/22/2013   removal of fascia  overlying the right psoas Right 09/22/2013   RENAL ARTERY STENT Right 09/22/2013   RIGHT COLECTOMY Right 09/22/2013   ROBOTIC ASSITED PARTIAL NEPHRECTOMY  05/22/2012   Procedure: ROBOTIC ASSITED PARTIAL NEPHRECTOMY;  Surgeon: Alexis Frock, MD;  Location: [facility];  Service: Urology;  Laterality: Right;  Right Robotic Cyst Decortication and Partial Nephrectomy     Allergies: Avelox [moxifloxacin hcl in nacl], Enbucrilate, and Tape  Medications: Prior to Admission medications   Medication Sig Start Date End Date Taking? Authorizing Provider  aspirin EC 81 MG tablet Take 1 tablet (81 mg total) by mouth daily. Swallow whole. 08/31/20   Sherren Mocha, MD  B Complex Vitamins (B COMPLEX 100 PO) Take 100 mg by mouth daily.    [provider]  Bevacizumab (AVASTIN IV) Inject into the vein every 14 (fourteen) days.    [provider]  Calcium Carb-Cholecalciferol (CALCIUM 600/VITAMIN D PO) Take 1 capsule by mouth daily.    [provider]  carvedilol (COREG) 3.125 MG tablet TAKE 1 TABLET BY MOUTH TWICE A DAY WITH FOOD 05/04/22   Josue Hector, MD  Cetirizine-Pseudoephedrine (ZYRTEC-D PO) Take by mouth.    [provider]  cholecalciferol (VITAMIN D3) 25 MCG (1000 UNIT) tablet Take 2,000 Units by mouth daily.    [provider]  DULoxetine (CYMBALTA) 20 MG capsule TAKE ONE CAPSULE BY MOUTH DAILY 10/21/21   Copland, Gay Filler, MD  erlotinib (TARCEVA) 150 MG tablet Take 150 mg by mouth daily. Take on an empty stomach 1 hour before meals or 2 hours after.    [provider]  HYDROcodone-acetaminophen (NORCO) 7.5-325 MG tablet Take 2 tablets by mouth See admin instructions. Take 2 every 4 hours through out the day until taking ativan at bedtime, do not mix 06/14/22   Copland, Gay Filler, MD  loperamide (IMODIUM A-D) 2 MG tablet Take 2 mg by mouth 4 (four) times daily as needed for diarrhea or loose stools.    [provider]  LORazepam (ATIVAN)  1 MG tablet Take 0.5-1 tablets (0.5-1 mg total) by mouth at bedtime as needed for sleep. TAKE ONE HALF OR ONE TABLET BY MOUTH AT BEDTIME IF NEEDED FOR SLEEP, DO NOT TAKE WITH PAIN MEDS 06/14/22   Copland, Gay Filler, MD  Multiple Vitamins-Minerals (CENTRUM SILVER 50+MEN PO) Take 1 tablet by mouth daily.    [provider]  naloxone [facility]) nasal spray 4 mg/0.1 mL Use one spray in nostril every 2-3 minutes as needed for drug overdose (cancer patient on opioid therapy) 09/20/21   Copland, Gay Filler, MD  nitroGLYCERIN (NITROSTAT) 0.4 MG SL tablet Place 1 tablet (0.4 mg total) under the tongue every 5 (five) minutes x 3 doses as needed for chest pain. Please dispense #25 x 4 bottle 06/02/20   Burtis Junes, NP  OLANZapine (  ZYPREXA) 5 MG tablet Take 1 tablet (5 mg total) by mouth at bedtime. 06/26/22   Copland, Gay Filler, MD  omeprazole (PRILOSEC) 40 MG capsule Take 40 mg by mouth daily.    [provider]  ondansetron (ZOFRAN-ODT) 4 MG disintegrating tablet TAKE 1 TABLET BY MOUTH SUBLINGUALLY EVERY 8 HOURS AS NEEDED FOR NAUSEA OR VOMITING 02/18/22   Copland, Gay Filler, MD  Polyethyl Glycol-Propyl Glycol (SYSTANE OP) Place 1 drop into both eyes 2 (two) times daily as needed (dry eyes).    [provider]  rosuvastatin (CRESTOR) 10 MG tablet TAKE 1 TABLET BY MOUTH EVERY DAY 11/28/21   Josue Hector, MD  sacubitril-valsartan (ENTRESTO) 24-26 MG Take 1 tablet by mouth 2 (two) times daily. 03/09/22   Josue Hector, MD  sodium bicarbonate 650 MG tablet Take 650 mg by mouth daily. 01/03/22   [provider]  sodium chloride (OCEAN) 0.65 % SOLN nasal spray Place 1 spray into both nostrils at bedtime.    [provider]  traZODone (DESYREL) 50 MG tablet Take 25 mg by mouth at bedtime.    [provider]  vitamin C (ASCORBIC ACID) 500 MG tablet Take 500 mg by mouth daily.    [provider]     Family History  Adopted: Yes  Problem Relation Age of  Onset   Blindness Mother    Glaucoma Mother    Hypertension Father     Social History   Socioeconomic History   Marital status: Married    Spouse name: Not on file   Number of children: 0   Years of education: Not on file   Highest education level: 12th grade  Occupational History   Occupation: Scientist, clinical (histocompatibility and immunogenetics): [employer]    Comment: Retired  Tobacco Use   Smoking status: Former    Packs/day: 0.50    Years: 40.00    Total pack years: 20.00    Types: Cigarettes    Quit date: 2017    Years since quitting: 7.0   Smokeless tobacco: Never  Vaping Use   Vaping Use: Never used  Substance and Sexual Activity   Alcohol use: Not Currently   Drug use: Yes    Types: Marijuana    Comment: smokes every day   Sexual activity: Not Currently  Other Topics Concern   Not on file  Social History Narrative   Walks daily for exercise.   Social Determinants of Health   Financial Resource Strain: Low Risk  (03/09/2021)   Overall Financial Resource Strain (CARDIA)    Difficulty of Paying Living Expenses: Not hard at all  Food Insecurity: No Food Insecurity (03/09/2021)   Hunger Vital Sign    Worried About Running Out of Food in the Last Year: Never true    Ran Out of Food in the Last Year: Never true  Transportation Needs: No Transportation Needs (03/09/2021)   PRAPARE - Hydrologist (Medical): No    Lack of Transportation (Non-Medical): No  Physical Activity: Sufficiently Active (03/09/2021)   Exercise Vital Sign    Days of Exercise per Week: 5 days    Minutes of Exercise per Session: 30 min  Stress: No Stress Concern Present (03/09/2021)   [place]    Feeling of Stress : Only a little  Social Connections: Moderately Isolated (03/09/2021)   Social Connection and Isolation Panel [NHANES]    Frequency of Communication with  Friends and Family: Once a week    Frequency of Social  Gatherings with Friends and Family: Once a week    Attends Religious Services: Never    Marine scientist or Organizations: Yes    Attends Music therapist: 1 to 4 times per year    Marital Status: Married    ECOG Status: 2 - Symptomatic, <50% confined to bed  Review of Systems: A 12 point ROS discussed and pertinent positives are indicated in the HPI above.  All other systems are negative.  Review of Systems  Vital Signs: BP (!) 125/94 (BP Location: Left Arm)   Pulse (!) 108   SpO2 99%   Advance Care Plan: The advanced care plan/surrogate decision maker was discussed at the time of visit and documented in the medical record.    Physical Exam Constitutional:      General: He is not in acute distress.    Appearance: He is not toxic-appearing.  Eyes:     General: No scleral icterus.    Conjunctiva/sclera: Conjunctivae normal.  Abdominal:     General: Abdomen is flat. There is no distension.     Palpations: Abdomen is soft.     Tenderness: There is no abdominal tenderness. There is no guarding.     Hernia: A hernia is present.  Skin:    Coloration: Skin is not jaundiced.  Neurological:     General: No focal deficit present.     Mental Status: Mental status is at baseline.  Psychiatric:        Mood and Affect: Mood normal.        Thought Content: Thought content normal.     Mallampati Score:     Imaging: No results found.  Labs:  CBC: Recent Labs    03/14/22 1218  WBC 6.6  HGB 11.6*  HCT 35.5*  PLT 252    COAGS: No results for input(s): "INR", "APTT" in the last 8760 hours.  BMP: Recent Labs    03/14/22 1218  NA 132*  K 4.3  CL 103  CO2 18*  GLUCOSE 119*  BUN 33*  CALCIUM 9.4  CREATININE 1.64*  GFRNONAA 46*    LIVER FUNCTION TESTS: Recent Labs    03/14/22 1218  BILITOT 0.5  AST 40  ALT 22  ALKPHOS 157*  PROT 7.3  ALBUMIN 3.6     Assessment and Plan:  Complex oncology case with hereditary leiomyomatosis,  metastatic renal cell carcinoma, and newly diagnosed esophageal adenocarcinoma.  Recent dysphagia and ongoing weight loss.  Concerns for nutritional status to continue ongoing chemotherapy.  Imaging reviewed and discussed with the patient.  Stomach is collapsed by imaging with the enlarged liver superiorly and the transverse colon inferiorly.  There may be a small percutaneous access window once the stomach is insufflated with air to place a pushin type balloon retention gastrostomy.  I would not favor a pull-through technique because of the known ulcerated esophageal adenocarcinoma.  Plan: He has updated surveillance CT imaging scheduled in the next few days.  I plan to review the CT imaging and make sure his anatomy is still favorable for an attempt at fluoroscopic gastrostomy placement.  He understands this may be unsuccessful because of his anatomy.  He would like [place] to schedule G-tube attempt at [facility] because of close proximity.  Hopefully this can be scheduled in the next 1 to 2 weeks following the CTs.  Thank you for this interesting consult.  I greatly enjoyed  meeting Arlyce Harman and look forward to participating in their care.  A copy of this report was sent to the requesting provider on this date.  Electronically Signed: Greggory Keen 06/27/2022, 11:37 AM   I spent a total of  60 Minutes   in face to face in clinical consultation, greater than 50% of which was counseling/coordinating care for This patient with newly diagnosed esophageal adenocarcinoma, dysphagia and weight loss

## 2022-06-28 ENCOUNTER — Telehealth: Payer: Self-pay

## 2022-06-28 NOTE — Telephone Encounter (Signed)
(  2:08 pm) PC SW left a message for patient and his wife requesting a return call to discuss palliative care services and set up an initial consult visit.

## 2022-06-29 DIAGNOSIS — J439 Emphysema, unspecified: Secondary | ICD-10-CM | POA: Diagnosis not present

## 2022-06-29 DIAGNOSIS — Z85528 Personal history of other malignant neoplasm of kidney: Secondary | ICD-10-CM | POA: Diagnosis not present

## 2022-06-29 DIAGNOSIS — C787 Secondary malignant neoplasm of liver and intrahepatic bile duct: Secondary | ICD-10-CM | POA: Diagnosis not present

## 2022-06-29 DIAGNOSIS — C801 Malignant (primary) neoplasm, unspecified: Secondary | ICD-10-CM | POA: Diagnosis not present

## 2022-06-29 DIAGNOSIS — C7989 Secondary malignant neoplasm of other specified sites: Secondary | ICD-10-CM | POA: Diagnosis not present

## 2022-06-29 DIAGNOSIS — J9809 Other diseases of bronchus, not elsewhere classified: Secondary | ICD-10-CM | POA: Diagnosis not present

## 2022-06-29 DIAGNOSIS — Z1509 Genetic susceptibility to other malignant neoplasm: Secondary | ICD-10-CM | POA: Diagnosis not present

## 2022-06-29 DIAGNOSIS — S2239XA Fracture of one rib, unspecified side, initial encounter for closed fracture: Secondary | ICD-10-CM | POA: Diagnosis not present

## 2022-06-29 DIAGNOSIS — Z9889 Other specified postprocedural states: Secondary | ICD-10-CM | POA: Diagnosis not present

## 2022-06-29 DIAGNOSIS — R918 Other nonspecific abnormal finding of lung field: Secondary | ICD-10-CM | POA: Diagnosis not present

## 2022-06-29 DIAGNOSIS — K769 Liver disease, unspecified: Secondary | ICD-10-CM | POA: Diagnosis not present

## 2022-06-29 DIAGNOSIS — C786 Secondary malignant neoplasm of retroperitoneum and peritoneum: Secondary | ICD-10-CM | POA: Diagnosis not present

## 2022-07-03 ENCOUNTER — Other Ambulatory Visit: Payer: Self-pay | Admitting: Gastroenterology

## 2022-07-03 DIAGNOSIS — Z1509 Genetic susceptibility to other malignant neoplasm: Secondary | ICD-10-CM

## 2022-07-03 DIAGNOSIS — C155 Malignant neoplasm of lower third of esophagus: Secondary | ICD-10-CM

## 2022-07-04 DIAGNOSIS — D649 Anemia, unspecified: Secondary | ICD-10-CM | POA: Diagnosis not present

## 2022-07-04 DIAGNOSIS — Z85528 Personal history of other malignant neoplasm of kidney: Secondary | ICD-10-CM | POA: Diagnosis not present

## 2022-07-04 DIAGNOSIS — D490 Neoplasm of unspecified behavior of digestive system: Secondary | ICD-10-CM | POA: Diagnosis not present

## 2022-07-04 DIAGNOSIS — R634 Abnormal weight loss: Secondary | ICD-10-CM | POA: Diagnosis not present

## 2022-07-04 DIAGNOSIS — C649 Malignant neoplasm of unspecified kidney, except renal pelvis: Secondary | ICD-10-CM | POA: Diagnosis not present

## 2022-07-04 DIAGNOSIS — R002 Palpitations: Secondary | ICD-10-CM | POA: Diagnosis not present

## 2022-07-04 DIAGNOSIS — M2550 Pain in unspecified joint: Secondary | ICD-10-CM | POA: Diagnosis not present

## 2022-07-04 DIAGNOSIS — M79646 Pain in unspecified finger(s): Secondary | ICD-10-CM | POA: Diagnosis not present

## 2022-07-04 DIAGNOSIS — R1310 Dysphagia, unspecified: Secondary | ICD-10-CM | POA: Diagnosis not present

## 2022-07-04 DIAGNOSIS — M25569 Pain in unspecified knee: Secondary | ICD-10-CM | POA: Diagnosis not present

## 2022-07-04 DIAGNOSIS — C7951 Secondary malignant neoplasm of bone: Secondary | ICD-10-CM | POA: Diagnosis not present

## 2022-07-04 DIAGNOSIS — D630 Anemia in neoplastic disease: Secondary | ICD-10-CM | POA: Diagnosis not present

## 2022-07-04 DIAGNOSIS — M4800 Spinal stenosis, site unspecified: Secondary | ICD-10-CM | POA: Diagnosis not present

## 2022-07-04 DIAGNOSIS — M25521 Pain in right elbow: Secondary | ICD-10-CM | POA: Diagnosis not present

## 2022-07-06 ENCOUNTER — Encounter: Payer: Self-pay | Admitting: Family Medicine

## 2022-07-07 ENCOUNTER — Telehealth: Payer: Self-pay

## 2022-07-07 ENCOUNTER — Encounter: Payer: Self-pay | Admitting: Family Medicine

## 2022-07-07 NOTE — Telephone Encounter (Signed)
(  3:45 pm) PC SW left a message for patient and his wife requesting a call back to discuss palliative care services and and to schedule a visit.

## 2022-07-12 ENCOUNTER — Other Ambulatory Visit: Payer: Self-pay | Admitting: Radiology

## 2022-07-12 ENCOUNTER — Encounter: Payer: Self-pay | Admitting: Family Medicine

## 2022-07-12 DIAGNOSIS — C641 Malignant neoplasm of right kidney, except renal pelvis: Secondary | ICD-10-CM

## 2022-07-12 DIAGNOSIS — C155 Malignant neoplasm of lower third of esophagus: Secondary | ICD-10-CM

## 2022-07-12 DIAGNOSIS — G893 Neoplasm related pain (acute) (chronic): Secondary | ICD-10-CM

## 2022-07-13 ENCOUNTER — Ambulatory Visit (HOSPITAL_COMMUNITY)
Admission: RE | Admit: 2022-07-13 | Discharge: 2022-07-13 | Disposition: A | Discharge status: Home / Self Care | Payer: Medicare HMO | Source: Ambulatory Visit | Attending: Gastroenterology | Admitting: Gastroenterology

## 2022-07-13 ENCOUNTER — Other Ambulatory Visit (HOSPITAL_COMMUNITY): Payer: Self-pay | Admitting: Physician Assistant

## 2022-07-13 ENCOUNTER — Other Ambulatory Visit (HOSPITAL_COMMUNITY): Payer: Self-pay | Admitting: Interventional Radiology

## 2022-07-13 ENCOUNTER — Inpatient Hospital Stay (HOSPITAL_COMMUNITY)
Admission: RE | Admit: 2022-07-13 | Discharge: 2022-07-13 | Disposition: A | Discharge status: Home / Self Care | Payer: Medicare HMO | Source: Ambulatory Visit | Attending: Interventional Radiology | Admitting: Interventional Radiology

## 2022-07-13 ENCOUNTER — Encounter (HOSPITAL_COMMUNITY): Payer: Self-pay

## 2022-07-13 DIAGNOSIS — R1011 Right upper quadrant pain: Secondary | ICD-10-CM

## 2022-07-13 DIAGNOSIS — Z87891 Personal history of nicotine dependence: Secondary | ICD-10-CM | POA: Diagnosis not present

## 2022-07-13 DIAGNOSIS — C787 Secondary malignant neoplasm of liver and intrahepatic bile duct: Secondary | ICD-10-CM | POA: Diagnosis not present

## 2022-07-13 DIAGNOSIS — C649 Malignant neoplasm of unspecified kidney, except renal pelvis: Secondary | ICD-10-CM | POA: Diagnosis not present

## 2022-07-13 DIAGNOSIS — Z9221 Personal history of antineoplastic chemotherapy: Secondary | ICD-10-CM | POA: Insufficient documentation

## 2022-07-13 DIAGNOSIS — Z1509 Genetic susceptibility to other malignant neoplasm: Secondary | ICD-10-CM

## 2022-07-13 DIAGNOSIS — C159 Malignant neoplasm of esophagus, unspecified: Secondary | ICD-10-CM | POA: Diagnosis not present

## 2022-07-13 DIAGNOSIS — E860 Dehydration: Secondary | ICD-10-CM | POA: Diagnosis not present

## 2022-07-13 DIAGNOSIS — R1310 Dysphagia, unspecified: Secondary | ICD-10-CM | POA: Diagnosis not present

## 2022-07-13 DIAGNOSIS — C155 Malignant neoplasm of lower third of esophagus: Secondary | ICD-10-CM

## 2022-07-13 HISTORY — PX: IR GASTROSTOMY TUBE MOD SED: IMG625

## 2022-07-13 LAB — BASIC METABOLIC PANEL
Anion gap: 11 (ref 5–15)
BUN: 31 mg/dL — ABNORMAL HIGH (ref 8–23)
CO2: 22 mmol/L (ref 22–32)
Calcium: 10.2 mg/dL (ref 8.9–10.3)
Chloride: 104 mmol/L (ref 98–111)
Creatinine, Ser: 1.53 mg/dL — ABNORMAL HIGH (ref 0.61–1.24)
GFR, Estimated: 50 mL/min — ABNORMAL LOW (ref >60)
Glucose, Bld: 97 mg/dL (ref 70–99)
Potassium: 3.9 mmol/L (ref 3.5–5.1)
Sodium: 137 mmol/L (ref 135–145)

## 2022-07-13 LAB — CBC WITH DIFFERENTIAL/PLATELET
Abs Immature Granulocytes: 0.01 10*3/uL (ref 0.00–0.07)
Basophils Absolute: 0 10*3/uL (ref 0.0–0.1)
Basophils Relative: 1 %
Eosinophils Absolute: 0.9 10*3/uL — ABNORMAL HIGH (ref 0.0–0.5)
Eosinophils Relative: 11 %
HCT: 45.3 % (ref 39.0–52.0)
Hemoglobin: 15.1 g/dL (ref 13.0–17.0)
Immature Granulocytes: 0 %
Lymphocytes Relative: 28 %
Lymphs Abs: 2.1 10*3/uL (ref 0.7–4.0)
MCH: 29.8 pg (ref 26.0–34.0)
MCHC: 33.3 g/dL (ref 30.0–36.0)
MCV: 89.5 fL (ref 80.0–100.0)
Monocytes Absolute: 0.5 10*3/uL (ref 0.1–1.0)
Monocytes Relative: 7 %
Neutro Abs: 4 10*3/uL (ref 1.7–7.7)
Neutrophils Relative %: 53 %
Platelets: 223 10*3/uL (ref 150–400)
RBC: 5.06 MIL/uL (ref 4.22–5.81)
RDW: 14.6 % (ref 11.5–15.5)
WBC: 7.5 10*3/uL (ref 4.0–10.5)
nRBC: 0 % (ref 0.0–0.2)

## 2022-07-13 LAB — PROTIME-INR
INR: 1 (ref 0.8–1.2)
Prothrombin Time: 13.1 seconds (ref 11.4–15.2)

## 2022-07-13 MED ORDER — CEFAZOLIN SODIUM-DEXTROSE 2-4 GM/100ML-% IV SOLN
INTRAVENOUS | Status: AC
  Administered 2022-07-13: 2 g via INTRAVENOUS
  Filled 2022-07-13: qty 100

## 2022-07-13 MED ORDER — LIDOCAINE VISCOUS HCL 2 % MT SOLN
OROMUCOSAL | Status: AC
  Administered 2022-07-13: 2 mL via ORAL
  Filled 2022-07-13: qty 15

## 2022-07-13 MED ORDER — GLUCAGON HCL RDNA (DIAGNOSTIC) 1 MG IJ SOLR
INTRAMUSCULAR | Status: AC
  Filled 2022-07-13: qty 1

## 2022-07-13 MED ORDER — GLUCAGON HCL RDNA (DIAGNOSTIC) 1 MG IJ SOLR
INTRAMUSCULAR | Status: AC | PRN
  Administered 2022-07-13 (×2): .5 mg via INTRAVENOUS

## 2022-07-13 MED ORDER — HYDROCODONE-ACETAMINOPHEN 7.5-325 MG PO TABS: 2.0000 | ORAL_TABLET | ORAL | 0 refills | Status: DC

## 2022-07-13 MED ORDER — SODIUM CHLORIDE 0.9 % IV SOLN: INTRAVENOUS | Status: DC

## 2022-07-13 MED ORDER — IOHEXOL 300 MG/ML  SOLN
50.0000 mL | Freq: Once | INTRAMUSCULAR | Status: AC | PRN
  Administered 2022-07-13: 15 mL

## 2022-07-13 MED ORDER — FENTANYL CITRATE (PF) 100 MCG/2ML IJ SOLN
INTRAMUSCULAR | Status: AC
  Filled 2022-07-13: qty 2

## 2022-07-13 MED ORDER — CEFAZOLIN SODIUM-DEXTROSE 2-4 GM/100ML-% IV SOLN: 2.0000 g | INTRAVENOUS | Status: AC

## 2022-07-13 MED ORDER — MIDAZOLAM HCL 2 MG/2ML IJ SOLN
INTRAMUSCULAR | Status: AC
  Filled 2022-07-13: qty 2

## 2022-07-13 MED ORDER — FENTANYL CITRATE (PF) 100 MCG/2ML IJ SOLN
INTRAMUSCULAR | Status: AC | PRN
  Administered 2022-07-13 (×2): 50 ug via INTRAVENOUS

## 2022-07-13 MED ORDER — MIDAZOLAM HCL 2 MG/2ML IJ SOLN
INTRAMUSCULAR | Status: AC | PRN
  Administered 2022-07-13 (×2): 1 mg via INTRAVENOUS

## 2022-07-13 MED ORDER — LIDOCAINE-EPINEPHRINE 1 %-1:100000 IJ SOLN
INTRAMUSCULAR | Status: AC
  Administered 2022-07-13: 8 mL via INTRADERMAL
  Filled 2022-07-13: qty 1

## 2022-07-13 MED ORDER — LIDOCAINE HCL 1 % IJ SOLN
INTRAMUSCULAR | Status: AC
  Filled 2022-07-13: qty 20

## 2022-07-13 NOTE — Progress Notes (Signed)
Patient ID: Douglas [name], male   DOB: May 14, 1956, 67 y.o.   MRN: [id number]   Called pt. Today at 1515 regarding abd pain after 16 fr Gtube placement this morning.  He reports his abd is more than just sore but not firm, no bleeding at gtube, no blood in the tube, and doesn't feel a bulge or hematoma of his abd wall.  He says his abd is soft.  He took some pain meds he has at home also around lunch time.  I offered to have him comeback to [facility] for a quick outpt site check but he doesn't have transportation and not sure that is needed at this point.  He knows if is abd gets worse to come to the ED.    I've arranged for an IR PA to call him tomorrow to check on him.  He may need to be seen tomorrow if not improving.

## 2022-07-13 NOTE — Discharge Instructions (Addendum)
Cleanse around insertion site with soap and water; apply triple antibiotic ointment and cover with gauze for next 7 days. Do not use scissors near catheter.

## 2022-07-13 NOTE — Procedures (Signed)
Interventional Radiology Procedure Note  Procedure: 23f entuit balloon gtube    Complications: None  Estimated Blood Loss:  0  Findings: Confirmed in the stomach Full use tomorrow    MTamera Punt MD

## 2022-07-13 NOTE — Progress Notes (Signed)
Added to discharge instructions per Dr Annamaria Boots. Pt to have nothing by mouth till 10 am on Friday 07/14/2022 except ice chips. Pt and his wife aware.

## 2022-07-13 NOTE — Progress Notes (Deleted)
Spoke with [place] PA in regards to resumption of food. Light food consumption today after 6 hours following procedure. Pt and his wife aware.

## 2022-07-13 NOTE — H&P (Addendum)
Chief Complaint: Patient was seen in consultation today for No chief complaint on file.  at the request of Armbruster,Demarr P  Referring Physician(s): Armbruster,Yadir P  Supervising Physician: Daryll Brod  Patient Status: [facility] - Out-pt  History of Present Illness: Douglas [name] is a 67 y.o. male who has a rare hereditary leiomyomatosis and metastatic renal cell carcinoma.  He has extensive known liver metastases and is receiving chemotherapy at [place] [facility] with Dr. Wendee Beavers.  Recently he had dysphagia.  This required EGD and biopsy of a mid esophageal ulcerated mass which revealed esophageal adenocarcinoma.  He now is experiencing some difficulty with eating/nutrition resulting in weight loss.   He met with Dr. Annamaria Boots in clinic on 06/27/22 to discuss fluoroscopic gastrostomy placement.  Discussed possibility of not having a safe window to insert tube, but would not know for certain until patient consumed barium and stomach was insufflated.  He has decided to proceed.     Past Medical History:  Diagnosis Date   Allergic rhinitis    Anxiety    Arthritis    "all over my body" (10/16/2013)   Cataract    Chronic back pain    "neck to lower back" (10/16/2013)   Colon polyps    Compression fracture    S/P L-SPINE; pt does not recall this hx on 10/16/2013   COPD (chronic obstructive pulmonary disease) (HCC)    FVC .66   Coronary artery disease    Depression    "situational since 09/22/2013 OR"   Encounter for long-term (current) use of other medications    GERD (gastroesophageal reflux disease)    Hemoptysis    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)    History of blood transfusion 09/2013   "think so; not sure; related to big OR"   History of surgical fusion joint    DDD C-SPINE   Hx of blood clots    Hyperlipidemia    Hypertension    Hypertensive retinopathy    Leiomyoma OF SKIN   INCREASED RISK RENAL CELL CA  NEEDS CT OF KIDNEYS EVERY 2  YEARS NEXT DUE  02/06/13   Melanoma in situ ([place])    Melanoma of back ([place])    Neuromuscular disorder ([place])    Osteoarthritis    Renal calculi    Renal cell carcinoma ([place])    type 2; papillary (metastized to bone)   Tobacco use disorder    Unspecified vitamin D deficiency     Past Surgical History:  Procedure Laterality Date   ANTERIOR CERVICAL DECOMP/DISCECTOMY FUSION  1992   ANTERIOR CRUCIATE LIGAMENT REPAIR Bilateral [phone]   APPENDECTOMY  09/22/2013   CHOLECYSTECTOMY  09/22/2013   "had tumors in it"   CORONARY STENT INTERVENTION N/A 05/24/2020   Procedure: CORONARY STENT INTERVENTION;  Surgeon: Martinique, Peter M, MD;  Location: [place] [facility];  Service: Cardiovascular;  Laterality: N/A;   HAND LIGAMENT RECONSTRUCTION Right    HARVEST BONE GRAFT Right 1992   "hip; for neck fusion"   INGUINAL HERNIA REPAIR Right 2004   INGUINAL HERNIA REPAIR Left 12/28/2021   Procedure: OPEN LEFT INGUINAL HERNIA REPAIR;  Surgeon: Coralie Keens, MD;  Location: [facility];  Service: General;  Laterality: Left;   INSERTION OF MESH Left 12/28/2021   Procedure: INSERTION OF MESH;  Surgeon: Coralie Keens, MD;  Location: [place];  Service: General;  Laterality: Left;   LEFT HEART CATH AND CORONARY ANGIOGRAPHY N/A 05/24/2020   Procedure: LEFT HEART CATH  AND CORONARY ANGIOGRAPHY;  Surgeon: Martinique, Peter M, MD;  Location: [place] [facility];  Service: Cardiovascular;  Laterality: N/A;   LIGAMENT REPAIR Right ~ 2001   "little finger"   LYMPH NODE DISSECTION Right 09/22/2013   "in the area of the kidney; they were clean"   MELANOMA EXCISION  ~ 2007   "off my back"   PARTIAL NEPHRECTOMY Right 09/22/2013   removal of fascia overlying the right psoas Right 09/22/2013   RENAL ARTERY STENT Right 09/22/2013   RIGHT COLECTOMY Right 09/22/2013   ROBOTIC ASSITED PARTIAL NEPHRECTOMY  05/22/2012   Procedure: ROBOTIC ASSITED PARTIAL NEPHRECTOMY;  Surgeon: Alexis Frock, MD;  Location: [facility];  Service: Urology;  Laterality: Right;  Right Robotic Cyst Decortication and Partial Nephrectomy     Allergies: Avelox [moxifloxacin hcl in nacl], Enbucrilate, and Tape  Medications: Prior to Admission medications   Medication Sig Start Date End Date Taking? Authorizing Provider  aspirin EC 81 MG tablet Take 1 tablet (81 mg total) by mouth daily. Swallow whole. 08/31/20  Yes Sherren Mocha, MD  B Complex Vitamins (B COMPLEX 100 PO) Take 100 mg by mouth daily.   Yes [provider]  carvedilol (COREG) 3.125 MG tablet TAKE 1 TABLET BY MOUTH TWICE A DAY WITH FOOD 05/04/22  Yes Josue Hector, MD  Cetirizine-Pseudoephedrine (ZYRTEC-D PO) Take by mouth.   Yes [provider]  cholecalciferol (VITAMIN D3) 25 MCG (1000 UNIT) tablet Take 2,000 Units by mouth daily.   Yes [provider]  DULoxetine (CYMBALTA) 20 MG capsule TAKE ONE CAPSULE BY MOUTH DAILY 10/21/21  Yes Copland, Gay Filler, MD  erlotinib (TARCEVA) 150 MG tablet Take 150 mg by mouth daily. Take on an empty stomach 1 hour before meals or 2 hours after.   Yes [provider]  HYDROcodone-acetaminophen (NORCO) 7.5-325 MG tablet Take 2 tablets by mouth See admin instructions. Take 2 every 4 hours through out the day until taking ativan at bedtime, do not mix 06/14/22  Yes Copland, Gay Filler, MD  LORazepam (ATIVAN) 1 MG tablet Take 0.5-1 tablets (0.5-1 mg total) by mouth at bedtime as needed for sleep. TAKE ONE HALF OR ONE TABLET BY MOUTH AT BEDTIME IF NEEDED FOR SLEEP, DO NOT TAKE WITH PAIN MEDS 06/14/22  Yes Copland, Gay Filler, MD  Multiple Vitamins-Minerals (CENTRUM SILVER 50+MEN PO) Take 1 tablet by mouth daily.   Yes [provider]  OLANZapine (ZYPREXA) 5 MG tablet Take 1 tablet (5 mg total) by mouth at bedtime. 06/26/22  Yes Copland, Gay Filler, MD  omeprazole (PRILOSEC) 40 MG capsule Take 40 mg by mouth daily.   Yes [provider]  ondansetron (ZOFRAN-ODT) 4 MG disintegrating  tablet TAKE 1 TABLET BY MOUTH SUBLINGUALLY EVERY 8 HOURS AS NEEDED FOR NAUSEA OR VOMITING 02/18/22  Yes Copland, Gay Filler, MD  Polyethyl Glycol-Propyl Glycol (SYSTANE OP) Place 1 drop into both eyes 2 (two) times daily as needed (dry eyes).   Yes [provider]  rosuvastatin (CRESTOR) 10 MG tablet TAKE 1 TABLET BY MOUTH EVERY DAY 11/28/21  Yes Josue Hector, MD  sacubitril-valsartan (ENTRESTO) 24-26 MG Take 1 tablet by mouth 2 (two) times daily. 03/09/22  Yes Josue Hector, MD  sodium bicarbonate 650 MG tablet Take 650 mg by mouth daily. 01/03/22  Yes [provider]  sodium chloride (OCEAN) 0.65 % SOLN nasal spray Place 1 spray into both nostrils at bedtime.   Yes [provider]  traZODone (DESYREL) 50 MG tablet  Take 25 mg by mouth at bedtime.   Yes [provider]  vitamin C (ASCORBIC ACID) 500 MG tablet Take 500 mg by mouth daily.   Yes [provider]  Bevacizumab (AVASTIN IV) Inject into the vein every 14 (fourteen) days.    [provider]  Calcium Carb-Cholecalciferol (CALCIUM 600/VITAMIN D PO) Take 1 capsule by mouth daily.    [provider]  loperamide (IMODIUM A-D) 2 MG tablet Take 2 mg by mouth 4 (four) times daily as needed for diarrhea or loose stools.    [provider]  naloxone [facility]) nasal spray 4 mg/0.1 mL Use one spray in nostril every 2-3 minutes as needed for drug overdose (cancer patient on opioid therapy) 09/20/21   Copland, Gay Filler, MD  nitroGLYCERIN (NITROSTAT) 0.4 MG SL tablet Place 1 tablet (0.4 mg total) under the tongue every 5 (five) minutes x 3 doses as needed for chest pain. Please dispense #25 x 4 bottle 06/02/20   Burtis Junes, NP     Family History  Adopted: Yes  Problem Relation Age of Onset   Blindness Mother    Glaucoma Mother    Hypertension Father     Social History   Socioeconomic History   Marital status: Married    Spouse name: Not on file   Number of children: 0    Years of education: Not on file   Highest education level: 12th grade  Occupational History   Occupation: Scientist, clinical (histocompatibility and immunogenetics): [employer]    Comment: Retired  Tobacco Use   Smoking status: Former    Packs/day: 0.50    Years: 40.00    Total pack years: 20.00    Types: Cigarettes    Quit date: 2017    Years since quitting: 7.0   Smokeless tobacco: Never  Vaping Use   Vaping Use: Never used  Substance and Sexual Activity   Alcohol use: Not Currently   Drug use: Yes    Types: Marijuana    Comment: smokes every day   Sexual activity: Not Currently  Other Topics Concern   Not on file  Social History Narrative   Walks daily for exercise.   Social Determinants of Health   Financial Resource Strain: Low Risk  (03/09/2021)   Overall Financial Resource Strain (CARDIA)    Difficulty of Paying Living Expenses: Not hard at all  Food Insecurity: No Food Insecurity (03/09/2021)   Hunger Vital Sign    Worried About Running Out of Food in the Last Year: Never true    Ran Out of Food in the Last Year: Never true  Transportation Needs: No Transportation Needs (03/09/2021)   PRAPARE - Hydrologist (Medical): No    Lack of Transportation (Non-Medical): No  Physical Activity: Sufficiently Active (03/09/2021)   Exercise Vital Sign    Days of Exercise per Week: 5 days    Minutes of Exercise per Session: 30 min  Stress: No Stress Concern Present (03/09/2021)   [place]    Feeling of Stress : Only a little  Social Connections: Moderately Isolated (03/09/2021)   Social Connection and Isolation Panel [NHANES]    Frequency of Communication with Friends and Family: Once a week    Frequency of Social Gatherings with Friends and Family: Once a week    Attends Religious Services: Never    Marine scientist or Organizations: Yes    Attends [employer]  or Organization Meetings: 1 to 4 times per year     Marital Status: Married    Review of Systems: A 12 point ROS discussed and pertinent positives are indicated in the HPI above.  All other systems are negative.  Review of Systems  Constitutional:  Positive for activity change, appetite change and unexpected weight change.  HENT:  Positive for rhinorrhea and sinus pressure.   Eyes: Negative.   Respiratory: Negative.    Cardiovascular: Negative.   Gastrointestinal:        Describes chest pain with eating  Endocrine: Negative.   Genitourinary: Negative.   Musculoskeletal: Negative.   Allergic/Immunologic: Negative.   Neurological: Negative.   Hematological: Negative.   Psychiatric/Behavioral: Negative.      Vital Signs: BP (!) 125/98 (BP Location: Right Arm)   Pulse 93   Temp 98.2 F (36.8 C) (Oral)   Resp 15   SpO2 98%   Physical Exam Constitutional:      General: He is awake.     Appearance: He is underweight.  HENT:     Mouth/Throat:     Mouth: Mucous membranes are moist.     Pharynx: Oropharynx is clear.  Eyes:     General: No scleral icterus.    Extraocular Movements: Extraocular movements intact.  Cardiovascular:     Rate and Rhythm: Normal rate and regular rhythm.     Pulses: Normal pulses.     Heart sounds: Normal heart sounds.  Pulmonary:     Effort: Pulmonary effort is normal. No respiratory distress.     Breath sounds: Normal breath sounds.  Abdominal:     General: Abdomen is flat. There is no distension.     Palpations: Abdomen is soft.     Tenderness: There is no abdominal tenderness.  Musculoskeletal:        General: No swelling.     Comments: Chronic pain of left shoulder  Skin:    General: Skin is warm and dry.  Neurological:     General: No focal deficit present.     Mental Status: He is alert and oriented to person, place, and time.  Psychiatric:        Attention and Perception: Attention normal.        Mood and Affect: Mood is anxious.        Speech: Speech normal.        Thought  Content: Thought content normal.   Imaging: No results found.  Labs:  CBC: Recent Labs    03/14/22 1218 07/13/22 0700  WBC 6.6 7.5  HGB 11.6* 15.1  HCT 35.5* 45.3  PLT 252 223    COAGS: Recent Labs    07/13/22 0700  INR 1.0    BMP: Recent Labs    03/14/22 1218 07/13/22 0700  NA 132* 137  K 4.3 3.9  CL 103 104  CO2 18* 22  GLUCOSE 119* 97  BUN 33* 31*  CALCIUM 9.4 10.2  CREATININE 1.64* 1.53*  GFRNONAA 46* 50*    LIVER FUNCTION TESTS: Recent Labs    03/14/22 1218  BILITOT 0.5  AST 40  ALT 22  ALKPHOS 157*  PROT 7.3  ALBUMIN 3.6    Assessment and Plan:  Malignant neoplasm of lower third of esophagus --difficulty and pain with eating --decreased oral intake has led to weight loss --Pt presents for planned percutaneous gastrostomy, is appropriately NPO, has held his ASA, and drank barium around 11:15pm last night. --Labs, images, vitals, meds, and history reviewed --Will  proceed with planned procedure and anticipated discharge later today.  Risks and benefits image guided gastrostomy tube placement was discussed with the patient including, but not limited to the need for a barium enema during the procedure, bleeding, infection, peritonitis and/or damage to adjacent structures.  Possibility of inability to place gastrostomy tube also discussed.  All of the patient's questions were answered, patient is agreeable to proceed.  Consent signed and in chart.    Thank you for this interesting consult.  I greatly enjoyed meeting Douglas [name] and look forward to participating in their care.  A copy of this report was sent to the requesting provider on this date.  Electronically Signed: Pasty Spillers, PA 07/13/2022, 8:25 AM   I spent a total of  25 Minutes in face to face in clinical consultation, greater than 50% of which was counseling/coordinating care for percutaneous gastrostomy

## 2022-07-14 ENCOUNTER — Ambulatory Visit (HOSPITAL_COMMUNITY)
Admission: RE | Admit: 2022-07-14 | Discharge: 2022-07-14 | Disposition: A | Discharge status: Home / Self Care | Payer: Medicare HMO | Source: Ambulatory Visit | Attending: Interventional Radiology | Admitting: Interventional Radiology

## 2022-07-14 ENCOUNTER — Telehealth: Payer: Self-pay | Admitting: Internal Medicine

## 2022-07-14 ENCOUNTER — Telehealth: Payer: Self-pay

## 2022-07-14 ENCOUNTER — Other Ambulatory Visit (HOSPITAL_COMMUNITY): Payer: Medicare HMO

## 2022-07-14 DIAGNOSIS — R1011 Right upper quadrant pain: Secondary | ICD-10-CM

## 2022-07-14 HISTORY — PX: IR PATIENT EVAL TECH 0-60 MINS: IMG5564

## 2022-07-14 MED ORDER — OXYCODONE HCL 5 MG PO TABS: 10.0000 mg | ORAL_TABLET | ORAL | 0 refills | Status: DC | PRN

## 2022-07-14 MED ORDER — CYCLOBENZAPRINE HCL 5 MG PO TABS: 5.0000 mg | ORAL_TABLET | Freq: Three times a day (TID) | ORAL | 0 refills | Status: DC | PRN

## 2022-07-14 NOTE — Progress Notes (Addendum)
Pt called after procedure 07/13/22 and stated that he was experiencing excruciating pain at G-tube site. Pt was scheduled to be seen and later called to cancel eval. Pt was called this morning for f/u and reports that he had pain all night not controlled with pain medication and muscle spasms. He reports 8/10 pain. Pt agreed to come for evaluation at 1430 in IR. Pt aware that rx has beeen sent to pharmacy for spasms.     Narda Rutherford, AGNP-BC 07/14/2022, 9:25 AM

## 2022-07-14 NOTE — Progress Notes (Signed)
Pt presents to IR for post gastrostomy tube placement pain. He reports severe pain not relieved by his pain medication or repositioning. G-tube was evaluated at bedside by Dr. Anselm Pancoast with [place]. US shows tube is in good position with no complications. Bumper was retracted slightly and clean dressing was applied. Pt was advised to discontinue his current pain medication and start oxycodone 10 mg with flexeril 5 mg for the next few days until the surgical pain resolves. Pt advised to call IR with questions or concerns.     Narda Rutherford, AGNP-BC 07/14/2022, 3:43 PM

## 2022-07-14 NOTE — Telephone Encounter (Signed)
1302 Palliative Care Note  RN called to schedule initial appt. Scheduled for Mon 07/17/22 at 1100. Contact info given to pt on this call.  Jacqulyn Cane, RN

## 2022-07-15 ENCOUNTER — Other Ambulatory Visit (HOSPITAL_COMMUNITY): Payer: Self-pay | Admitting: Interventional Radiology

## 2022-07-15 DIAGNOSIS — R1011 Right upper quadrant pain: Secondary | ICD-10-CM

## 2022-07-17 ENCOUNTER — Telehealth: Payer: Self-pay | Admitting: Family Medicine

## 2022-07-17 ENCOUNTER — Encounter (HOSPITAL_COMMUNITY): Payer: Self-pay | Admitting: Radiology

## 2022-07-17 ENCOUNTER — Telehealth: Payer: Self-pay | Admitting: Radiology

## 2022-07-17 ENCOUNTER — Other Ambulatory Visit (HOSPITAL_COMMUNITY): Payer: Self-pay | Admitting: Interventional Radiology

## 2022-07-17 ENCOUNTER — Other Ambulatory Visit: Payer: Medicare HMO

## 2022-07-17 ENCOUNTER — Encounter: Payer: Self-pay | Admitting: Family Medicine

## 2022-07-17 VITALS — BP 98/60 | HR 84 | Temp 98.0°F

## 2022-07-17 DIAGNOSIS — Z515 Encounter for palliative care: Secondary | ICD-10-CM

## 2022-07-17 DIAGNOSIS — R1011 Right upper quadrant pain: Secondary | ICD-10-CM

## 2022-07-17 NOTE — Telephone Encounter (Signed)
Received a message from palliative care as follows: HI. My name is PJ and I am a palliative care nurse with [employer]. I am sitting here with Mr Delellis who is still having a considerable amount of pain (8 on 1/10) with his Feeding tube site. He describes the pain as sharp and "on the inside". Reports that the insertion site is uncomfortable but no more than is expected. He is very concerned regarding the "inside Pain". He started the recommended change in pain meds this past Friday after he left IR at [facility] and has not noted any change in pain or relief with new regime.   He also is not aware of when to remove dressings. And he is unsure of feeding procedure.   Please have someone reach out to him about his continued pain and concerns.   ----------------------------------------------------------- Gave patient a call-no answer on home or cell.  Will send a MyChart message

## 2022-07-17 NOTE — Procedures (Signed)
Pt presents to IR for post gastrostomy tube placement pain. He reports severe pain not relieved by his pain medication or repositioning. G-tube was evaluated at bedside by Dr. Anselm Pancoast with [place]. US shows tube is in good position with no complications. Bumper was retracted slightly and clean dressing was applied. Pt was advised to discontinue his current pain medication and start oxycodone 10 mg with flexeril 5 mg for the next few days until the surgical pain resolves. Pt advised to call IR with questions or concerns.   Narda Rutherford ,NP was present during the patient evaluation with Dr Anselm Pancoast.

## 2022-07-18 ENCOUNTER — Telehealth: Payer: Self-pay | Admitting: Family Medicine

## 2022-07-18 ENCOUNTER — Telehealth: Payer: Self-pay | Admitting: Radiology

## 2022-07-18 ENCOUNTER — Other Ambulatory Visit (HOSPITAL_COMMUNITY): Payer: Self-pay | Admitting: Interventional Radiology

## 2022-07-18 DIAGNOSIS — R1084 Generalized abdominal pain: Secondary | ICD-10-CM

## 2022-07-18 NOTE — Telephone Encounter (Signed)
Spoke with patient he stated he needed lost of testing and req CB end of 07/2022

## 2022-07-18 NOTE — Progress Notes (Signed)
Patient ID: Douglas [name], male   DOB: November 24, 1955, 67 y.o.   MRN: [id number] Called made to pt regarding persistent abd pain at G tube site. Pt was previously seen by Dr. Anselm Pancoast on 1/26 and site appeared stable on US exam. Pt's pain medication regimen was modified, however he still c/o discomfort in region. Pt also had questions regarding how G  tube was to be utilized for feeds . Recommended to pt that he contact his oncologist for any additional needed adjustment in pain med regimen; he has been hesitant to use fentanyl patch due to fear of abuse . Also rec that he contact his dietician/nutritionist for questions regarding feeds via tube. If he continues to have pain despite these measures then we would rec f/u CT. Above discussed with pt.

## 2022-07-18 NOTE — Telephone Encounter (Signed)
Contacted pt again today to check status of abd pain post G tube; pain persists so will proceed with CT abd following d/w Dr. Quintin Alto); pt prefers to come in on 12/1 so have CT scheduled for 12:30 followed by IR eval afterwards

## 2022-07-19 ENCOUNTER — Other Ambulatory Visit: Payer: Self-pay | Admitting: Family Medicine

## 2022-07-19 DIAGNOSIS — C649 Malignant neoplasm of unspecified kidney, except renal pelvis: Secondary | ICD-10-CM | POA: Diagnosis not present

## 2022-07-19 DIAGNOSIS — R634 Abnormal weight loss: Secondary | ICD-10-CM | POA: Diagnosis not present

## 2022-07-19 DIAGNOSIS — D490 Neoplasm of unspecified behavior of digestive system: Secondary | ICD-10-CM | POA: Diagnosis not present

## 2022-07-19 DIAGNOSIS — R002 Palpitations: Secondary | ICD-10-CM | POA: Diagnosis not present

## 2022-07-19 DIAGNOSIS — M4800 Spinal stenosis, site unspecified: Secondary | ICD-10-CM | POA: Diagnosis not present

## 2022-07-19 DIAGNOSIS — R1310 Dysphagia, unspecified: Secondary | ICD-10-CM | POA: Diagnosis not present

## 2022-07-19 DIAGNOSIS — C159 Malignant neoplasm of esophagus, unspecified: Secondary | ICD-10-CM | POA: Diagnosis not present

## 2022-07-19 DIAGNOSIS — D649 Anemia, unspecified: Secondary | ICD-10-CM | POA: Diagnosis not present

## 2022-07-19 DIAGNOSIS — C7951 Secondary malignant neoplasm of bone: Secondary | ICD-10-CM | POA: Diagnosis not present

## 2022-07-19 DIAGNOSIS — M2559 Pain in other specified joint: Secondary | ICD-10-CM | POA: Diagnosis not present

## 2022-07-19 DIAGNOSIS — F329 Major depressive disorder, single episode, unspecified: Secondary | ICD-10-CM

## 2022-07-19 DIAGNOSIS — Z87891 Personal history of nicotine dependence: Secondary | ICD-10-CM | POA: Diagnosis not present

## 2022-07-19 NOTE — Progress Notes (Signed)
PATIENT NAME: Douglas [name] DOB: 1956/02/20 MRN: [id number]  PRIMARY CARE PROVIDER: Darreld Mclean, MD  RESPONSIBLE PARTY:  Acct ID - Guarantor Home Phone Work Phone Relationship Acct Type  [id number] Etheleen [phone]  Self P/F     [address], [place], [place] [address]    Palliative Care Initial Encounter Note   Completed home visit with Olivia Canter, RN. Wife also present     HISTORY OF PRESENT ILLNESS: Stage IV Kidney CA; Esophageal CA; hernia repair July 2023  Feels pain inside abdomen after insertion of feeding tube; has script for pain meds until surgery pain resolves; sent message to Drs. Copland and Shick r/t the need for tube feed teaching; pain at the insertion site and pain inside despite taking Oxycodone '10mg'$  & Flexeril '5mg'$  beginning on Friday Jan 26th.   06/22/22 wt -139lbs; pt feels as though he has lost wt since that date since he is unable to eat 3 meals and he has not yet started his tube feeds   Cognitive: A&O x4  Appetite: swallowing difficulties; ate bowl of soggy cereal for past 2 days; talked about soft food recipes to give pt food variety   Mobility: walks on his own but bent over d/t pain level   Sleeping Pattern: not sleeping well since feeding tube was inserted; wakes up off and on  Respiratory: no SOB at this time but does experience some SOB when pain level is 10/10  Pain: pt reports he takes Hydrocodone 7.5/325 but the pain remains unrelieved  Palliative Care/ Hospice: LPN explained role and purpose of palliative care including visit frequency. Also discussed benefits of hospice care as well as the differences between the two with patient.    Goals of Care: To stay in the home and "have a chance at life' until his death; wants to try to get stronger and gain some weight  Summary of next visit: Feb 29th @ 2pm   CODE STATUS: Full Code  ADVANCED DIRECTIVES: No MOST FORM: No PPS: 80%   PHYSICAL EXAM:   VITALS: Today's Vitals    07/17/22 1130  BP: 98/60  Pulse: 84  Temp: 98 F (36.7 C)  TempSrc: Temporal  SpO2: 98%    LUNGS: clear to auscultation  CARDIAC: Cor RRR EXTREMITIES: no issues SKIN: Skin color, texture, turgor normal. No rashes or lesions  NEURO: negative       Douglas [name] Douglas Housekeeper, LPN

## 2022-07-20 ENCOUNTER — Encounter (HOSPITAL_COMMUNITY): Payer: Self-pay

## 2022-07-20 ENCOUNTER — Other Ambulatory Visit (HOSPITAL_COMMUNITY): Payer: Self-pay | Admitting: Interventional Radiology

## 2022-07-20 ENCOUNTER — Ambulatory Visit (HOSPITAL_COMMUNITY)
Admission: RE | Admit: 2022-07-20 | Discharge: 2022-07-20 | Disposition: A | Discharge status: Home / Self Care | Payer: Medicare HMO | Source: Ambulatory Visit | Attending: Interventional Radiology | Admitting: Interventional Radiology

## 2022-07-20 DIAGNOSIS — R1084 Generalized abdominal pain: Secondary | ICD-10-CM

## 2022-07-20 DIAGNOSIS — C787 Secondary malignant neoplasm of liver and intrahepatic bile duct: Secondary | ICD-10-CM | POA: Diagnosis not present

## 2022-07-20 DIAGNOSIS — R188 Other ascites: Secondary | ICD-10-CM | POA: Diagnosis not present

## 2022-07-20 DIAGNOSIS — C786 Secondary malignant neoplasm of retroperitoneum and peritoneum: Secondary | ICD-10-CM | POA: Diagnosis not present

## 2022-07-20 DIAGNOSIS — K8689 Other specified diseases of pancreas: Secondary | ICD-10-CM | POA: Diagnosis not present

## 2022-07-20 MED ORDER — OXYCODONE HCL 5 MG PO TABS: 10.0000 mg | ORAL_TABLET | ORAL | 0 refills | Status: DC | PRN

## 2022-07-20 NOTE — Progress Notes (Addendum)
Pt presents to radiology for CT abd to evaluate continuing pain at G-tube site. Pt was seen 07/14/22 for the same. [place] at that time showed tube to be in position. CT abd today shows gastrostomy tube in place with no complication. Pt was advised of today's scan result. He reports some mild improvement with no pain around insertion site but more distal to left pelvic area where he had previous hernia repair. He adds that the dietician at the cancer center just evaluated his G-tube yesterday and received tube feeding during visit.  He states that 10 mg oxycodone that was prescribed last week worked well to control pain but is now out. Pt was advised to try his Fentanyl patches that were prescribed last year that he has never used. He agrees that he will try Fentanyl for pain control as well.  Refill for oxycodone sent to [facility] on [address].   Pt verbalizes understanding of instructions and will contact IR for further needs.     Narda Rutherford, AGNP-BC 07/20/2022, 1:25 PM

## 2022-07-21 DIAGNOSIS — D490 Neoplasm of unspecified behavior of digestive system: Secondary | ICD-10-CM | POA: Diagnosis not present

## 2022-07-21 DIAGNOSIS — C7951 Secondary malignant neoplasm of bone: Secondary | ICD-10-CM | POA: Diagnosis not present

## 2022-07-21 DIAGNOSIS — Z1509 Genetic susceptibility to other malignant neoplasm: Secondary | ICD-10-CM | POA: Diagnosis not present

## 2022-07-21 NOTE — Progress Notes (Signed)
CARDIOLOGY OFFICE NOTE  Date:  07/28/2022    Douglas [name] Date of Birth: [birth year] Medical Record [id number]  PCP:  Darreld Mclean, MD  Cardiologist:  Johnsie Cancel   History of Present Illness:  67 y.o. 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 Followed at [facility] for years  In clinical trials. Has been on Tarceva and Avastin .  Also has a hx of ASCAD with STEMI with DES to proximal LAD 05/24/20 And residual 50% Ramus. EF was 35-40%. Due to CAD and DAT he was taken off his Avastin and Tarceva both contraindicated with ischemic heart disease. Started on [place] and coreg no aldactone due to soft BP, lack of volume overload and renal dx  MRI 08/27/20 E 31% LAD scar no mural apical thrombus  Eliquis stopped  Discussed possible need for AICD in future. Needs to f[facility]u [facility] for alternative Rx cancer in light of his CAD and moderately reduced EF  09/09/20 Dr Franki Monte [phone] called directly to discuss She is contemplating using Nivolumab as single Agent immunoRx This drug has a small risk of myocarditis but is certainly safer than avastin and tarceva  09/23/20 spoke with Dr Samul Dada at [facility] who is apparently involved now and starting Nivolumab every 2 weeks I told him we would plan on f/u Cardiac MRI June   Has now started Nivolumab/Opdivo has more congestion and MSK pains on it Noted 21-42% incidence of this side effect with drug Also more GERD. Discussed being ok to take any 2 nd generation antihistamine with sudafed  if needed   TTE reviewed 10/04/20 EF 35-40%  MRI 12/01/20 reviewed EF 39% anterior/septal/apical scar   Seen by Dr Radford Pax  04/12/21 for chest pain Atypical Myovue  04/21/21 with no ischemia LAD infarct EF 42%  Lots of joint pain from optivo Trying to get in phase one trial at [facility] for new Rx since his current Rx is not working well with more abdominal /liver growth  12/28/21 Had open left inguinal hernia repair with mesh by Dr Ninfa Linden under general  anesthesia with no cardiac complications Did indicate issues with infection and use of "glue" that he is intolerant to  Has lost a lot of weight and cancer spreading Oncology trying to see if his tumor biomarkers cross react with other cancers to use alternative agents. Given situation we discussed going back to Avastin and Tarceva despite cardiac risks   Unfortunately now has been diagnosed with esophageal adenocarcinoma with obstructing mass post G tube placement Getting chemo at [facility] with Dr Wendee Beavers He has extensive liver mets as well  Seeing oncology at [facility] but G tube feeds hard and weight still an issue  Past Medical History:  Diagnosis Date   Allergic rhinitis    Anxiety    Arthritis    "all over my body" (10/16/2013)   Cataract    Chronic back pain    "neck to lower back" (10/16/2013)   Colon polyps    Compression fracture    S/P L-SPINE; pt does not recall this hx on 10/16/2013   COPD (chronic obstructive pulmonary disease) ([place])    FVC .66   Coronary artery disease    Depression    "situational since 09/22/2013 OR"   Encounter for long-term (current) use of other medications    GERD (gastroesophageal reflux disease)    Hemoptysis    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)    History of blood transfusion 09/2013   "think so; not sure;  related to big OR"   History of surgical fusion joint    DDD C-SPINE   Hx of blood clots    Hyperlipidemia    Hypertension    Hypertensive retinopathy    Leiomyoma OF SKIN   INCREASED RISK RENAL CELL CA  NEEDS CT OF KIDNEYS EVERY 2 YEARS NEXT DUE  02/06/13   Melanoma in situ ([place])    Melanoma of back ([place])    Neuromuscular disorder ([place])    Osteoarthritis    Renal calculi    Renal cell carcinoma ([place])    type 2; papillary (metastized to bone)   Tobacco use disorder    Unspecified vitamin D deficiency     Past Surgical History:  Procedure Laterality Date   ANTERIOR CERVICAL DECOMP/DISCECTOMY FUSION  1992    ANTERIOR CRUCIATE LIGAMENT REPAIR Bilateral [phone]   APPENDECTOMY  09/22/2013   CHOLECYSTECTOMY  09/22/2013   "had tumors in it"   CORONARY STENT INTERVENTION N/A 05/24/2020   Procedure: CORONARY STENT INTERVENTION;  Surgeon: Martinique, Roi Jafari M, MD;  Location: [facility];  Service: Cardiovascular;  Laterality: N/A;   HAND LIGAMENT RECONSTRUCTION Right    HARVEST BONE GRAFT Right 1992   "hip; for neck fusion"   INGUINAL HERNIA REPAIR Right 2004   INGUINAL HERNIA REPAIR Left 12/28/2021   Procedure: OPEN LEFT INGUINAL HERNIA REPAIR;  Surgeon: Coralie Keens, MD;  Location: [facility];  Service: General;  Laterality: Left;   INSERTION OF MESH Left 12/28/2021   Procedure: INSERTION OF MESH;  Surgeon: Coralie Keens, MD;  Location: [facility];  Service: General;  Laterality: Left;   IR GASTROSTOMY TUBE MOD SED  07/13/2022   IR PATIENT EVAL TECH 0-60 MINS  07/14/2022   LEFT HEART CATH AND CORONARY ANGIOGRAPHY N/A 05/24/2020   Procedure: LEFT HEART CATH AND CORONARY ANGIOGRAPHY;  Surgeon: Martinique, Dalesha Stanback M, MD;  Location: [facility];  Service: Cardiovascular;  Laterality: N/A;   LIGAMENT REPAIR Right ~ 2001   "little finger"   LYMPH NODE DISSECTION Right 09/22/2013   "in the area of the kidney; they were clean"   MELANOMA EXCISION  ~ 2007   "off my back"   PARTIAL NEPHRECTOMY Right 09/22/2013   removal of fascia overlying the right psoas Right 09/22/2013   RENAL ARTERY STENT Right 09/22/2013   RIGHT COLECTOMY Right 09/22/2013   ROBOTIC ASSITED PARTIAL NEPHRECTOMY  05/22/2012   Procedure: ROBOTIC ASSITED PARTIAL NEPHRECTOMY;  Surgeon: Alexis Frock, MD;  Location: [facility];  Service: Urology;  Laterality: Right;  Right Robotic Cyst Decortication and Partial Nephrectomy      Medications: Current Meds  Medication Sig   aspirin EC 81 MG tablet Take 1 tablet (81 mg total) by mouth daily. Swallow whole.   B Complex Vitamins (B COMPLEX 100 PO) Take 100 mg by mouth daily.    Bevacizumab (AVASTIN IV) Inject into the vein every 14 (fourteen) days.   Calcium Carb-Cholecalciferol (CALCIUM 600/VITAMIN D PO) Take 1 capsule by mouth daily.   carvedilol (COREG) 3.125 MG tablet TAKE 1 TABLET BY MOUTH TWICE A DAY WITH FOOD   Cetirizine-Pseudoephedrine (ZYRTEC-D PO) Take by mouth.   cholecalciferol (VITAMIN D3) 25 MCG (1000 UNIT) tablet Take 2,000 Units by mouth daily.   cyclobenzaprine (FLEXERIL) 5 MG tablet Take 1 tablet (5 mg total) by mouth 3 (three) times daily as needed for muscle spasms.   DULoxetine (CYMBALTA) 20 MG capsule TAKE ONE CAPSULE BY MOUTH DAILY   erlotinib (TARCEVA) 150 MG  tablet Take 150 mg by mouth daily. Take on an empty stomach 1 hour before meals or 2 hours after.   loperamide (IMODIUM A-D) 2 MG tablet Take 2 mg by mouth 4 (four) times daily as needed for diarrhea or loose stools.   LORazepam (ATIVAN) 1 MG tablet Take 0.5-1 tablets (0.5-1 mg total) by mouth at bedtime as needed for sleep. TAKE ONE HALF OR ONE TABLET BY MOUTH AT BEDTIME IF NEEDED FOR SLEEP, DO NOT TAKE WITH PAIN MEDS   Multiple Vitamins-Minerals (CENTRUM SILVER 50+MEN PO) Take 1 tablet by mouth daily.   naloxone (NARCAN) nasal spray 4 mg/0.1 mL Use one spray in nostril every 2-3 minutes as needed for drug overdose (cancer patient on opioid therapy)   nitroGLYCERIN (NITROSTAT) 0.4 MG SL tablet Place 1 tablet (0.4 mg total) under the tongue every 5 (five) minutes x 3 doses as needed for chest pain. Please dispense #25 x 4 bottle   OLANZapine (ZYPREXA) 5 MG tablet TAKE 1 TABLET BY MOUTH EVERYDAY AT BEDTIME   omeprazole (PRILOSEC) 40 MG capsule Take 40 mg by mouth daily.   ondansetron (ZOFRAN-ODT) 4 MG disintegrating tablet TAKE 1 TABLET BY MOUTH SUBLINGUALLY EVERY 8 HOURS AS NEEDED FOR NAUSEA OR VOMITING   Polyethyl Glycol-Propyl Glycol (SYSTANE OP) Place 1 drop into both eyes 2 (two) times daily as needed (dry eyes).   rosuvastatin (CRESTOR) 10 MG tablet TAKE 1 TABLET BY MOUTH EVERY DAY    sacubitril-valsartan (ENTRESTO) 24-26 MG Take 1 tablet by mouth 2 (two) times daily.   sodium bicarbonate 650 MG tablet Take 650 mg by mouth daily.   sodium chloride (OCEAN) 0.65 % SOLN nasal spray Place 1 spray into both nostrils at bedtime.   traZODone (DESYREL) 50 MG tablet Take 25 mg by mouth at bedtime.   vitamin C (ASCORBIC ACID) 500 MG tablet Take 500 mg by mouth daily.     Allergies: Allergies  Allergen Reactions   Avelox [Moxifloxacin Hcl In Nacl] Hives, Shortness Of Breath and Swelling   Enbucrilate Hives and Rash    DERMABOND   Tape Dermatitis and Rash    Just the glue    Social History: The patient  reports that he quit smoking about 7 years ago. His smoking use included cigarettes. He has a 20.00 pack-year smoking history. He has never used smokeless tobacco. He reports that he does not currently use alcohol. He reports current drug use. Drug: Marijuana.   Family History: The patient's family history includes Blindness in his mother; Glaucoma in his mother; Hypertension in his father. He was adopted.   Review of Systems: Please see the history of present illness.   All other systems are reviewed and negative.   Physical Exam: VS:  BP 114/80   Pulse (!) 52   Ht 6' (1.829 m)   Wt 136 lb (61.7 kg)   SpO2 98%   BMI 18.44 kg/m  .  BMI Body mass index is 18.44 kg/m.  Wt Readings from Last 3 Encounters:  07/28/22 136 lb (61.7 kg)  06/22/22 139 lb 6.4 oz (63.2 kg)  02/03/22 156 lb (70.8 kg)   Affect appropriate Healthy:  appears stated age HEENT: normal Neck supple with no adenopathy JVP normal no bruits no thyromegaly Lungs clear with no wheezing and good diaphragmatic motion Heart:  S1/S2 no murmur, no rub, gallop or click PMI normal Abdomen: benighn, BS positve, no tenderness, no AAA no bruit.  No HSM or HJR post left inguinal hernia repair  Distal  pulses intact with no bruits No edema Neuro non-focal Skin warm and dry No muscular weakness  LABORATORY  DATA:  EKG:   performed today and showed NSR with no ST changes. 07/28/2022 SR rate 59 old anterolateral MI   Lab Results  Component Value Date   WBC 7.5 07/13/2022   HGB 15.1 07/13/2022   HCT 45.3 07/13/2022   PLT 223 07/13/2022   GLUCOSE 97 07/13/2022   CHOL 146 06/02/2020   TRIG 244 (H) 06/02/2020   HDL 50 06/02/2020   LDLDIRECT 63.0 05/01/2019   LDLCALC 57 06/02/2020   ALT 22 03/14/2022   AST 40 03/14/2022   NA 137 07/13/2022   K 3.9 07/13/2022   CL 104 07/13/2022   CREATININE 1.53 (H) 07/13/2022   BUN 31 (H) 07/13/2022   CO2 22 07/13/2022   TSH 1.376 05/24/2020   PSA 1.86 04/14/2020   INR 1.0 07/13/2022   HGBA1C 5.2 05/29/2016     BNP (last 3 results) No results for input(s): "BNP" in the last 8760 hours.  ProBNP (last 3 results) No results for input(s): "PROBNP" in the last 8760 hours.   Other Studies Reviewed Today:  Cardiac cath 05/24/20  Prox LAD lesion is 90% stenosed. Ramus lesion is 50% stenosed. Mid RCA lesion is 35% stenosed. Post intervention, there is a 0% residual stenosis. A drug-eluting stent was successfully placed using a STENT RESOLUTE ONYX 3.0X22. LV end diastolic pressure is normal.   1. Single vessel occlusive CAD involving the proximal LAD 2. Normal LVEDP 3. Successful PCI of the proximal LAD with DES x 1   Plan: DAPT for one year. Risk factor modification. Will assess LV function with Echo.    Echo Impression 05/25/20   1. Left ventricular ejection fraction, by estimation, is 35 to 40%. The  left ventricle has moderately decreased function. The left ventricle has  no regional wall motion abnormalities. Left ventricular diastolic  parameters are consistent with Grade I  diastolic dysfunction (impaired relaxation). There is mild apical  dyskinesis. There is severe hypokinesis of the entire anterior septum, but  the basal and mid segments of the anterior wall have preserved  contractility.   2. Right ventricular systolic function is  normal. The right ventricular  size is normal.   3. The mitral valve is normal in structure. No evidence of mitral valve  regurgitation. No evidence of mitral stenosis.   4. The aortic valve is normal in structure. Aortic valve regurgitation is  not visualized. No aortic stenosis is present.   5. The inferior vena cava is normal in size with greater than 50%  respiratory variability, suggesting right atrial pressure of 3 mmHg.    Assessment/Plan: 1. STEMI -  05/24/20 with one vessel LAD CAD - s/p PCI  - ok to rx with just ASA at this time  - myovue no ischemia  04/21/21    2. ICM  - chronic systolic HF  -Continue prescription drug management with Entresto 24-26 mg twice daily, carvedilol 3.125 mg twice daily  -EF 39% by MRI 12/01/20 with no evidence Of myocarditis outside anterior infarcted region  -he does not appear volume overloaded on exam today - EF 42% by [facility] 04/21/21  - Per Dr Quentin Ore deferred AICD consideration given cancer   3. HLD  - he is on crestor LDL at goal   4. Elevated LFTs  - resolved normal 06/02/20 likely related to acute MI  5. Hereditary leiomyomatosis and renal cell cancer with mets  - Seeing  [facility] Samul D[facility] started on Nivolumab every 2 weeks - Main cardiac side effects myocarditis [facility] surveillance colonoscopy with Dr Havery Moros history of polyps  Has had significant bone and liver mets in past and current Rx not working as well Contacted [facility] to see if he can get in phase one trial or biomarkers for alternative Rx If not would be reasonable to go back to Traceva and Avastin despite cardiac risks as he is slowly dying of his spreading cancer with weight loss , poor functional status and spreading mets on imaging studies   6. Esophageal Cancer:  G tube in place less pain f/u oncology in [place]   Would appear that [place] is heading toward palliative type care due to his multiple malignancies Cardiac status is stable   Disposition:   FU 6 months    Time:  spent reviewing chart MRI/myovue discussing cancer Rx and [facility] referral patient interview and composing note 60 minutes    Signed: Jenkins Rouge, MD  07/28/2022 10:23 AM  [facility] [address] [place], [place]  [address] Phone: [phone] Fax: [phone]

## 2022-07-25 ENCOUNTER — Telehealth: Payer: Self-pay | Admitting: Family Medicine

## 2022-07-25 NOTE — Telephone Encounter (Signed)
Copied from [place] [id number]. Topic: Medicare AWV >> Jul 25, 2022 11:01 AM Devoria Glassing wrote: Reason for CRM: Left message for patient to schedule Annual Wellness Visit(AWV).  Please schedule with Health Nurse Advisor at [facility]. Please call [phone] ask for [facility].

## 2022-07-28 ENCOUNTER — Encounter: Payer: Self-pay | Admitting: Cardiovascular Disease

## 2022-07-28 ENCOUNTER — Ambulatory Visit: Payer: Medicare HMO | Attending: Cardiovascular Disease | Admitting: Cardiovascular Disease

## 2022-07-28 VITALS — BP 114/80 | HR 52 | Ht 72.0 in | Wt 136.0 lb

## 2022-07-28 DIAGNOSIS — Z1509 Genetic susceptibility to other malignant neoplasm: Secondary | ICD-10-CM | POA: Diagnosis not present

## 2022-07-28 DIAGNOSIS — I214 Non-ST elevation (NSTEMI) myocardial infarction: Secondary | ICD-10-CM

## 2022-07-28 DIAGNOSIS — E43 Unspecified severe protein-calorie malnutrition: Secondary | ICD-10-CM | POA: Diagnosis not present

## 2022-07-28 DIAGNOSIS — I2510 Atherosclerotic heart disease of native coronary artery without angina pectoris: Secondary | ICD-10-CM

## 2022-07-28 NOTE — Patient Instructions (Signed)
Medication Instructions:  Your physician recommends that you continue on your current medications as directed. Please refer to the Current Medication list given to you today.  *If you need a refill on your cardiac medications before your next appointment, please call your pharmacy*  Lab Work: If you have labs (blood work) drawn today and your tests are completely normal, you will receive your results only by: MyChart Message (if you have MyChart) OR A paper copy in the mail If you have any lab test that is abnormal or we need to change your treatment, we will call you to review the results.  Testing/Procedures: None ordered today.  Follow-Up: At [facility], you and your health needs are our priority.  As part of our continuing mission to provide you with exceptional heart care, we have created designated Provider Care Teams.  These Care Teams include your primary Cardiologist (physician) and Advanced Practice Providers (APPs -  Physician Assistants and Nurse Practitioners) who all work together to provide you with the care you need, when you need it.  We recommend signing up for the patient portal called "MyChart".  Sign up information is provided on this After Visit Summary.  MyChart is used to connect with patients for Virtual Visits (Telemedicine).  Patients are able to view lab/test results, encounter notes, upcoming appointments, etc.  Non-urgent messages can be sent to your provider as well.   To learn more about what you can do with MyChart, go to [email or web address].    Your next appointment:   6 month(s)  Provider:   Peter Nishan, MD      

## 2022-08-07 DIAGNOSIS — D2271 Melanocytic nevi of right lower limb, including hip: Secondary | ICD-10-CM | POA: Diagnosis not present

## 2022-08-07 DIAGNOSIS — D225 Melanocytic nevi of trunk: Secondary | ICD-10-CM | POA: Diagnosis not present

## 2022-08-07 DIAGNOSIS — L821 Other seborrheic keratosis: Secondary | ICD-10-CM | POA: Diagnosis not present

## 2022-08-07 DIAGNOSIS — Z85820 Personal history of malignant melanoma of skin: Secondary | ICD-10-CM | POA: Diagnosis not present

## 2022-08-07 DIAGNOSIS — Z85828 Personal history of other malignant neoplasm of skin: Secondary | ICD-10-CM | POA: Diagnosis not present

## 2022-08-08 DIAGNOSIS — I2510 Atherosclerotic heart disease of native coronary artery without angina pectoris: Secondary | ICD-10-CM | POA: Diagnosis not present

## 2022-08-08 DIAGNOSIS — I700 Atherosclerosis of aorta: Secondary | ICD-10-CM | POA: Diagnosis not present

## 2022-08-08 DIAGNOSIS — C169 Malignant neoplasm of stomach, unspecified: Secondary | ICD-10-CM | POA: Diagnosis not present

## 2022-08-08 DIAGNOSIS — C787 Secondary malignant neoplasm of liver and intrahepatic bile duct: Secondary | ICD-10-CM | POA: Diagnosis not present

## 2022-08-08 DIAGNOSIS — C7951 Secondary malignant neoplasm of bone: Secondary | ICD-10-CM | POA: Diagnosis not present

## 2022-08-08 DIAGNOSIS — J439 Emphysema, unspecified: Secondary | ICD-10-CM | POA: Diagnosis not present

## 2022-08-08 DIAGNOSIS — C786 Secondary malignant neoplasm of retroperitoneum and peritoneum: Secondary | ICD-10-CM | POA: Diagnosis not present

## 2022-08-10 DIAGNOSIS — G893 Neoplasm related pain (acute) (chronic): Secondary | ICD-10-CM | POA: Diagnosis not present

## 2022-08-10 DIAGNOSIS — D649 Anemia, unspecified: Secondary | ICD-10-CM | POA: Diagnosis not present

## 2022-08-10 DIAGNOSIS — C160 Malignant neoplasm of cardia: Secondary | ICD-10-CM | POA: Diagnosis not present

## 2022-08-10 DIAGNOSIS — C7951 Secondary malignant neoplasm of bone: Secondary | ICD-10-CM | POA: Diagnosis not present

## 2022-08-10 DIAGNOSIS — C649 Malignant neoplasm of unspecified kidney, except renal pelvis: Secondary | ICD-10-CM | POA: Diagnosis not present

## 2022-08-10 DIAGNOSIS — Z1509 Genetic susceptibility to other malignant neoplasm: Secondary | ICD-10-CM | POA: Diagnosis not present

## 2022-08-10 DIAGNOSIS — C787 Secondary malignant neoplasm of liver and intrahepatic bile duct: Secondary | ICD-10-CM | POA: Diagnosis not present

## 2022-08-10 DIAGNOSIS — M4802 Spinal stenosis, cervical region: Secondary | ICD-10-CM | POA: Diagnosis not present

## 2022-08-10 DIAGNOSIS — M4800 Spinal stenosis, site unspecified: Secondary | ICD-10-CM | POA: Diagnosis not present

## 2022-08-10 DIAGNOSIS — R1310 Dysphagia, unspecified: Secondary | ICD-10-CM | POA: Diagnosis not present

## 2022-08-10 DIAGNOSIS — C786 Secondary malignant neoplasm of retroperitoneum and peritoneum: Secondary | ICD-10-CM | POA: Diagnosis not present

## 2022-08-10 NOTE — Progress Notes (Signed)
[facility] Note  08/14/2022     CHIEF COMPLAINT Patient presents for Retina Follow Up  HISTORY OF PRESENT ILLNESS: Douglas [name] is a 67 y.o. male who presents to the clinic today for:   HPI     Retina Follow Up   Patient presents with  Other.  In both eyes.  Severity is moderate.  Duration of 8 months.  Since onset it is stable.  I, the attending physician,  performed the HPI with the patient and updated documentation appropriately.        Comments   Pt here for 8 mo ret f/u ERM w/ hole OS. Pt states VA has been a bit worse recently on and off. Struggling w/ depth perception. Has not had cataract sx. Pt still taking a cancer medication (rare kidney cancer) and will be soon receiving radiation for esophageal/stomach cancer which he was diagnosed w/ over the holiday. Pt is receiving Avastin infusions every two weeks. Recent weight loss and now has a feeding tube. Uses systane gtts PRN OU. Pt reports dryness OU.       Last edited by Bernarda Caffey, MD on 08/14/2022 12:22 PM.    Patient states that the depth perception is not as good at time, feels it may be from the BP medication.  Patient has a new cancer spot esophagus and upper stomach. He will begin radiation.    Referring physician: Darreld Mclean, MD [address] [place],  [place] [address]  HISTORICAL INFORMATION:   Selected notes from the MEDICAL RECORD NUMBER Referred by Dr. Wyatt Portela for concern of retinal tears   CURRENT MEDICATIONS: Current Outpatient Medications (Ophthalmic Drugs)  Medication Sig   Polyethyl Glycol-Propyl Glycol (SYSTANE OP) Place 1 drop into both eyes 2 (two) times daily as needed (dry eyes).   No current facility-administered medications for this visit. (Ophthalmic Drugs)   Current Outpatient Medications (Other)  Medication Sig   aspirin EC 81 MG tablet Take 1 tablet (81 mg total) by mouth daily. Swallow whole.   B Complex Vitamins (B  COMPLEX 100 PO) Take 100 mg by mouth daily.   Bevacizumab (AVASTIN IV) Inject into the vein every 14 (fourteen) days.   Calcium Carb-Cholecalciferol (CALCIUM 600/VITAMIN D PO) Take 1 capsule by mouth daily.   carvedilol (COREG) 3.125 MG tablet TAKE 1 TABLET BY MOUTH TWICE A DAY WITH FOOD   Cetirizine-Pseudoephedrine (ZYRTEC-D PO) Take by mouth.   cholecalciferol (VITAMIN D3) 25 MCG (1000 UNIT) tablet Take 2,000 Units by mouth daily.   DULoxetine (CYMBALTA) 20 MG capsule TAKE ONE CAPSULE BY MOUTH DAILY   erlotinib (TARCEVA) 150 MG tablet Take 150 mg by mouth daily. Take on an empty stomach 1 hour before meals or 2 hours after.   HYDROcodone-acetaminophen (NORCO) 7.5-325 MG tablet Take 2 tablets by mouth See admin instructions. Take 2 every 4 hours through out the day until taking ativan at bedtime, do not mix   loperamide (IMODIUM A-D) 2 MG tablet Take 2 mg by mouth 4 (four) times daily as needed for diarrhea or loose stools.   LORazepam (ATIVAN) 1 MG tablet Take 0.5-1 tablets (0.5-1 mg total) by mouth at bedtime as needed for sleep. TAKE ONE HALF OR ONE TABLET BY MOUTH AT BEDTIME IF NEEDED FOR SLEEP, DO NOT TAKE WITH PAIN MEDS   Multiple Vitamins-Minerals (CENTRUM SILVER 50+MEN PO) Take 1 tablet by mouth daily.   naloxone (NARCAN) nasal spray 4 mg/0.1 mL Use one  spray in nostril every 2-3 minutes as needed for drug overdose (cancer patient on opioid therapy)   nitroGLYCERIN (NITROSTAT) 0.4 MG SL tablet Place 1 tablet (0.4 mg total) under the tongue every 5 (five) minutes x 3 doses as needed for chest pain. Please dispense #25 x 4 bottle   OLANZapine (ZYPREXA) 5 MG tablet TAKE 1 TABLET BY MOUTH EVERYDAY AT BEDTIME   omeprazole (PRILOSEC) 40 MG capsule Take 40 mg by mouth daily.   ondansetron (ZOFRAN-ODT) 4 MG disintegrating tablet TAKE 1 TABLET BY MOUTH SUBLINGUALLY EVERY 8 HOURS AS NEEDED FOR NAUSEA OR VOMITING   rosuvastatin (CRESTOR) 10 MG tablet TAKE 1 TABLET BY MOUTH EVERY DAY    sacubitril-valsartan (ENTRESTO) 24-26 MG Take 1 tablet by mouth 2 (two) times daily.   sodium bicarbonate 650 MG tablet Take 650 mg by mouth daily.   sodium chloride (OCEAN) 0.65 % SOLN nasal spray Place 1 spray into both nostrils at bedtime.   traZODone (DESYREL) 50 MG tablet Take 25 mg by mouth at bedtime.   vitamin C (ASCORBIC ACID) 500 MG tablet Take 500 mg by mouth daily.   cyclobenzaprine (FLEXERIL) 5 MG tablet Take 1 tablet (5 mg total) by mouth 3 (three) times daily as needed for muscle spasms.   No current facility-administered medications for this visit. (Other)   REVIEW OF SYSTEMS: ROS   Positive for: Gastrointestinal, Neurological, Musculoskeletal, Cardiovascular, Eyes, Respiratory Negative for: Constitutional, Skin, Genitourinary, HENT, Endocrine, Psychiatric, Allergic/Imm, Heme/Lymph Last edited by Kingsley Spittle, COT on 08/14/2022  9:13 AM.     ALLERGIES Allergies  Allergen Reactions   Avelox [Moxifloxacin Hcl In Nacl] Hives, Shortness Of Breath and Swelling   Enbucrilate Hives and Rash    DERMABOND   Tape Dermatitis and Rash    Just the glue   PAST MEDICAL HISTORY Past Medical History:  Diagnosis Date   Allergic rhinitis    Anxiety    Arthritis    "all over my body" (10/16/2013)   Cataract    Chronic back pain    "neck to lower back" (10/16/2013)   Colon polyps    Compression fracture    S/P L-SPINE; pt does not recall this hx on 10/16/2013   COPD (chronic obstructive pulmonary disease) (HCC)    FVC .66   Coronary artery disease    Depression    "situational since 09/22/2013 OR"   Encounter for long-term (current) use of other medications    GERD (gastroesophageal reflux disease)    Hemoptysis    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)    History of blood transfusion 09/2013   "think so; not sure; related to big OR"   History of surgical fusion joint    DDD C-SPINE   Hx of blood clots    Hyperlipidemia    Hypertension     Hypertensive retinopathy    Leiomyoma OF SKIN   INCREASED RISK RENAL CELL CA  NEEDS CT OF KIDNEYS EVERY 2 YEARS NEXT DUE  02/06/13   Melanoma in situ ([facility])    Melanoma of back ([facility])    Neuromuscular disorder ([facility])    Osteoarthritis    Renal calculi    Renal cell carcinoma ([facility])    type 2; papillary (metastized to bone)   Tobacco use disorder    Unspecified vitamin D deficiency    Past Surgical History:  Procedure Laterality Date   ANTERIOR CERVICAL DECOMP/DISCECTOMY FUSION  1992   ANTERIOR CRUCIATE LIGAMENT REPAIR Bilateral [phone]  APPENDECTOMY  09/22/2013   CHOLECYSTECTOMY  09/22/2013   "had tumors in it"   CORONARY STENT INTERVENTION N/A 05/24/2020   Procedure: CORONARY STENT INTERVENTION;  Surgeon: Martinique, Peter M, MD;  Location: [facility];  Service: Cardiovascular;  Laterality: N/A;   HAND LIGAMENT RECONSTRUCTION Right    HARVEST BONE GRAFT Right 1992   "hip; for neck fusion"   INGUINAL HERNIA REPAIR Right 2004   INGUINAL HERNIA REPAIR Left 12/28/2021   Procedure: OPEN LEFT INGUINAL HERNIA REPAIR;  Surgeon: Coralie Keens, MD;  Location: [facility];  Service: General;  Laterality: Left;   INSERTION OF MESH Left 12/28/2021   Procedure: INSERTION OF MESH;  Surgeon: Coralie Keens, MD;  Location: [facility];  Service: General;  Laterality: Left;   IR GASTROSTOMY TUBE MOD SED  07/13/2022   IR PATIENT EVAL TECH 0-60 MINS  07/14/2022   LEFT HEART CATH AND CORONARY ANGIOGRAPHY N/A 05/24/2020   Procedure: LEFT HEART CATH AND CORONARY ANGIOGRAPHY;  Surgeon: Martinique, Peter M, MD;  Location: [facility];  Service: Cardiovascular;  Laterality: N/A;   LIGAMENT REPAIR Right ~ 2001   "little finger"   LYMPH NODE DISSECTION Right 09/22/2013   "in the area of the kidney; they were clean"   MELANOMA EXCISION  ~ 2007   "off my back"   PARTIAL NEPHRECTOMY Right 09/22/2013   removal of fascia overlying the right psoas Right 09/22/2013   RENAL ARTERY STENT Right  09/22/2013   RIGHT COLECTOMY Right 09/22/2013   ROBOTIC ASSITED PARTIAL NEPHRECTOMY  05/22/2012   Procedure: ROBOTIC ASSITED PARTIAL NEPHRECTOMY;  Surgeon: Alexis Frock, MD;  Location: [facility];  Service: Urology;  Laterality: Right;  Right Robotic Cyst Decortication and Partial Nephrectomy    FAMILY HISTORY Family History  Adopted: Yes  Problem Relation Age of Onset   Blindness Mother    Glaucoma Mother    Hypertension Father    SOCIAL HISTORY Social History   Tobacco Use   Smoking status: Former    Packs/day: 0.50    Years: 40.00    Total pack years: 20.00    Types: Cigarettes    Quit date: 2017    Years since quitting: 7.1   Smokeless tobacco: Never  Vaping Use   Vaping Use: Never used  Substance Use Topics   Alcohol use: Not Currently   Drug use: Yes    Types: Marijuana    Comment: smokes every day       OPHTHALMIC EXAM:  Base Eye Exam     Visual Acuity (Snellen - Linear)       Right Left   Dist cc 20/40 +1 20/30   Dist ph cc 20/25 +1 NI    Correction: Glasses         Tonometry (Tonopen, 9:21 AM)       Right Left   Pressure 12 14         Pupils       Pupils Dark Light Shape React APD   Right PERRL 4 3 Round Brisk None   Left PERRL 4 3 Round Brisk None         Visual Fields (Counting fingers)       Left Right    Full Full         Extraocular Movement       Right Left    Full, Ortho Full, Ortho         Neuro/Psych     Oriented x3: Yes   Mood/Affect:  Normal         Dilation     Both eyes: 1.0% Mydriacyl, 2.5% Phenylephrine @ 9:22 AM           Slit Lamp and Fundus Exam     External Exam       Right Left   External Normal Normal         Slit Lamp Exam       Right Left   Lids/Lashes Dermatochalasis - upper lid, mild Telangiectasia Mild Meibomian gland dysfunction, mild Telangiectasia, Dermatochalasis - upper lid   Conjunctiva/Sclera White and quiet, Mild temp Pinguecula White and quiet, Mild temp  Pinguecula   Cornea mild arcus, trace EBMD, trace tear film debris, 1-2+ PEE 2+ inferior Punctate epithelial erosions, mild arcus, mild tear film debris   Anterior Chamber Deep and quiet Deep and quiet   Iris Round and dilated Round and dilated   Lens 2+ Nuclear sclerosis, 2-3+ Cortical cataract 2-3+ Nuclear sclerosis, 2-3+ Cortical cataract   Anterior Vitreous Vitreous syneresis, Posterior vitreous detachment Vitreous syneresis, no pigment, Posterior vitreous detachment, Vitreous condensations         Fundus Exam       Right Left   Disc Pink and sharp, Compact, Temporal PPA Pink and sharp, Compact   C/D Ratio 0.2 0.2   Macula Flat, Blunted foveal reflex, no Heme, mild RPE mottling Flat, Blunted foveal reflex, ERM, mild cystic changes, no Heme   Vessels mild attenuation, Tortuous Tortuous, Copper wiring, Vascular attenuation   Periphery Attached, No heme Pigmented lattice inferiorly from 0500 to 0715 with large atrophic holes -- positive SRF -- good laser changes surrounding, no new RT/RD/lattice           Refraction     Wearing Rx       Sphere Cylinder Axis   Right -2.75 +0.75 029   Left -2.75 +0.75 146    Type: SVL           IMAGING AND PROCEDURES  Imaging and Procedures for 08/14/2022  OCT, Retina - OU - Both Eyes       Right Eye Quality was good. Central Foveal Thickness: 256. Progression has been stable. Findings include normal foveal contour, no IRF, no SRF.   Left Eye Quality was good. Central Foveal Thickness: 307. Progression has been stable. Findings include no SRF, abnormal foveal contour, epiretinal membrane, intraretinal fluid, lamellar hole, macular pucker (Mild ERM with lamellar hole and central cystic changes -- stable).   Notes *Images captured and stored on drive  Diagnosis / Impression:  OD: NFP; no IRF/SRF  OS: Mild ERM with lamellar hole and central cystic changes--stable  Clinical management:  See below  Abbreviations: NFP - Normal  foveal profile. CME - cystoid macular edema. PED - pigment epithelial detachment. IRF - intraretinal fluid. SRF - subretinal fluid. EZ - ellipsoid zone. ERM - epiretinal membrane. ORA - outer retinal atrophy. ORT - outer retinal tubulation. SRHM - subretinal hyper-reflective material. IRHM - intraretinal hyper-reflective material             ASSESSMENT/PLAN:    ICD-10-CM   1. Lattice degeneration of left retina  H35.412     2. Retinal holes, left  H33.322     3. Retinal detachment, left  H33.22     4. Epiretinal membrane (ERM) of left eye  H35.372 OCT, Retina - OU - Both Eyes    5. Lamellar macular hole of left eye  H35.342 OCT, Retina - OU - Both Eyes  6. Essential hypertension  I10     7. Hypertensive retinopathy of both eyes  H35.033     8. Combined forms of age-related cataract of both eyes  H25.813      1-3. Lattice degeneration w/ atrophic holes, OS - Pigmented lattice inferiorly from 0500 to 0715 with large atrophic holes, positive SRF - s/p laser retinopexy OS 08.25.22 -- good laser changes surrounding - no new RT/RD or lattice - continue to monitor   4,5. Epiretinal membrane w/ lamellar macular hole, left eye  - mild ERM w/ lamellar hole -- stable - BCVA 20/30 - stable - asymptomatic, no metamorphopsia - no indication for surgery at this time - monitor for now - f/u in 9-12 months, sooner prn, DFE, OCT  6,7. Hypertensive retinopathy OU - discussed importance of tight BP control - continue to monitor  8. Mixed Cataract, OU - The symptoms of cataract, surgical options, and treatments and risks were discussed with patient. - discussed diagnosis and progression - monitor  Ophthalmic Meds Ordered this visit:  No orders of the defined types were placed in this encounter.    Return in about 9 months (around 05/15/2023) for f/u ERM w/ Lamellar hole OD, DFE, OCT.  There are no Patient Instructions on file for this visit.  Explained the diagnoses, plan, and  follow up with the patient and they expressed understanding.  Patient expressed understanding of the importance of proper follow up care.   This document serves as a record of services personally performed by Gardiner Sleeper, MD, PhD. It was created on their behalf by Roselee Nova, COMT. The creation of this record is the provider's dictation and/or activities during the visit.  Electronically signed by: Roselee Nova, COMT 08/14/22 12:22 PM  This document serves as a record of services personally performed by Gardiner Sleeper, MD, PhD. It was created on their behalf by Renaldo Reel, [place] an ophthalmic technician. The creation of this record is the provider's dictation and/or activities during the visit.    Electronically signed by:  Renaldo Reel, COT  02.26.24 12:22 PM  Gardiner Sleeper, M.D., Ph.D. Diseases & Surgery of the Retina and Vitreous [facility]  I have reviewed the above documentation for accuracy and completeness, and I agree with the above. Gardiner Sleeper, M.D., Ph.D. 08/14/22 12:24 PM   Abbreviations: M myopia (nearsighted); A astigmatism; H hyperopia (farsighted); P presbyopia; Mrx spectacle prescription;  CTL contact lenses; OD right eye; OS left eye; OU both eyes  XT exotropia; ET esotropia; PEK punctate epithelial keratitis; PEE punctate epithelial erosions; DES dry eye syndrome; MGD meibomian gland dysfunction; ATs artificial tears; PFAT's preservative free artificial tears; Lambert nuclear sclerotic cataract; PSC posterior subcapsular cataract; ERM epi-retinal membrane; PVD posterior vitreous detachment; RD retinal detachment; DM diabetes mellitus; DR diabetic retinopathy; NPDR non-proliferative diabetic retinopathy; PDR proliferative diabetic retinopathy; CSME clinically significant macular edema; DME diabetic macular edema; dbh dot blot hemorrhages; CWS cotton wool spot; POAG primary open angle glaucoma; C/D cup-to-disc ratio; HVF humphrey visual  field; GVF goldmann visual field; OCT optical coherence tomography; IOP intraocular pressure; BRVO Branch retinal vein occlusion; CRVO central retinal vein occlusion; CRAO central retinal artery occlusion; BRAO branch retinal artery occlusion; RT retinal tear; SB scleral buckle; PPV pars plana vitrectomy; VH Vitreous hemorrhage; PRP panretinal laser photocoagulation; IVK intravitreal kenalog; VMT vitreomacular traction; MH Macular hole;  NVD neovascularization of the disc; NVE neovascularization elsewhere; AREDS age related eye disease study; ARMD age related macular degeneration;  POAG primary open angle glaucoma; EBMD epithelial/anterior basement membrane dystrophy; ACIOL anterior chamber intraocular lens; IOL intraocular lens; PCIOL posterior chamber intraocular lens; Phaco/IOL phacoemulsification with intraocular lens placement; Campbell photorefractive keratectomy; LASIK laser assisted in situ keratomileusis; HTN hypertension; DM diabetes mellitus; COPD chronic obstructive pulmonary disease

## 2022-08-11 ENCOUNTER — Encounter: Payer: Self-pay | Admitting: Family Medicine

## 2022-08-11 DIAGNOSIS — Z5111 Encounter for antineoplastic chemotherapy: Secondary | ICD-10-CM | POA: Diagnosis not present

## 2022-08-11 DIAGNOSIS — D490 Neoplasm of unspecified behavior of digestive system: Secondary | ICD-10-CM | POA: Diagnosis not present

## 2022-08-11 DIAGNOSIS — G893 Neoplasm related pain (acute) (chronic): Secondary | ICD-10-CM

## 2022-08-11 DIAGNOSIS — C7951 Secondary malignant neoplasm of bone: Secondary | ICD-10-CM | POA: Diagnosis not present

## 2022-08-11 DIAGNOSIS — C641 Malignant neoplasm of right kidney, except renal pelvis: Secondary | ICD-10-CM

## 2022-08-11 DIAGNOSIS — C649 Malignant neoplasm of unspecified kidney, except renal pelvis: Secondary | ICD-10-CM | POA: Diagnosis not present

## 2022-08-11 MED ORDER — HYDROCODONE-ACETAMINOPHEN 7.5-325 MG PO TABS: 2.0000 | ORAL_TABLET | ORAL | 0 refills | Status: DC

## 2022-08-14 ENCOUNTER — Encounter (INDEPENDENT_AMBULATORY_CARE_PROVIDER_SITE_OTHER): Payer: Self-pay | Admitting: Ophthalmology

## 2022-08-14 ENCOUNTER — Ambulatory Visit (INDEPENDENT_AMBULATORY_CARE_PROVIDER_SITE_OTHER): Payer: Medicare HMO | Admitting: Ophthalmology

## 2022-08-14 DIAGNOSIS — H35342 Macular cyst, hole, or pseudohole, left eye: Secondary | ICD-10-CM

## 2022-08-14 DIAGNOSIS — H33322 Round hole, left eye: Secondary | ICD-10-CM | POA: Diagnosis not present

## 2022-08-14 DIAGNOSIS — I10 Essential (primary) hypertension: Secondary | ICD-10-CM

## 2022-08-14 DIAGNOSIS — H35372 Puckering of macula, left eye: Secondary | ICD-10-CM | POA: Diagnosis not present

## 2022-08-14 DIAGNOSIS — H25813 Combined forms of age-related cataract, bilateral: Secondary | ICD-10-CM | POA: Diagnosis not present

## 2022-08-14 DIAGNOSIS — H35412 Lattice degeneration of retina, left eye: Secondary | ICD-10-CM

## 2022-08-14 DIAGNOSIS — H35033 Hypertensive retinopathy, bilateral: Secondary | ICD-10-CM

## 2022-08-14 DIAGNOSIS — H3322 Serous retinal detachment, left eye: Secondary | ICD-10-CM | POA: Diagnosis not present

## 2022-08-16 DIAGNOSIS — Z888 Allergy status to other drugs, medicaments and biological substances status: Secondary | ICD-10-CM | POA: Diagnosis not present

## 2022-08-16 DIAGNOSIS — Z1509 Genetic susceptibility to other malignant neoplasm: Secondary | ICD-10-CM | POA: Diagnosis not present

## 2022-08-16 DIAGNOSIS — C787 Secondary malignant neoplasm of liver and intrahepatic bile duct: Secondary | ICD-10-CM | POA: Diagnosis not present

## 2022-08-16 DIAGNOSIS — Z85528 Personal history of other malignant neoplasm of kidney: Secondary | ICD-10-CM | POA: Diagnosis not present

## 2022-08-16 DIAGNOSIS — Z87891 Personal history of nicotine dependence: Secondary | ICD-10-CM | POA: Diagnosis not present

## 2022-08-16 DIAGNOSIS — C159 Malignant neoplasm of esophagus, unspecified: Secondary | ICD-10-CM | POA: Diagnosis not present

## 2022-08-16 DIAGNOSIS — C160 Malignant neoplasm of cardia: Secondary | ICD-10-CM | POA: Diagnosis not present

## 2022-08-16 DIAGNOSIS — C7951 Secondary malignant neoplasm of bone: Secondary | ICD-10-CM | POA: Diagnosis not present

## 2022-08-16 DIAGNOSIS — Z881 Allergy status to other antibiotic agents status: Secondary | ICD-10-CM | POA: Diagnosis not present

## 2022-08-17 ENCOUNTER — Other Ambulatory Visit: Payer: Medicare HMO

## 2022-08-21 DIAGNOSIS — Z510 Encounter for antineoplastic radiation therapy: Secondary | ICD-10-CM | POA: Diagnosis not present

## 2022-08-21 DIAGNOSIS — C155 Malignant neoplasm of lower third of esophagus: Secondary | ICD-10-CM | POA: Diagnosis not present

## 2022-08-23 ENCOUNTER — Other Ambulatory Visit: Payer: Self-pay | Admitting: Cardiovascular Disease

## 2022-08-25 DIAGNOSIS — C649 Malignant neoplasm of unspecified kidney, except renal pelvis: Secondary | ICD-10-CM | POA: Diagnosis not present

## 2022-08-25 DIAGNOSIS — Z1509 Genetic susceptibility to other malignant neoplasm: Secondary | ICD-10-CM | POA: Diagnosis not present

## 2022-08-25 DIAGNOSIS — Z5111 Encounter for antineoplastic chemotherapy: Secondary | ICD-10-CM | POA: Diagnosis not present

## 2022-08-29 ENCOUNTER — Other Ambulatory Visit: Payer: Medicare HMO

## 2022-08-29 VITALS — BP 96/72 | HR 86 | Temp 98.0°F

## 2022-08-29 DIAGNOSIS — Z515 Encounter for palliative care: Secondary | ICD-10-CM

## 2022-08-30 DIAGNOSIS — Z510 Encounter for antineoplastic radiation therapy: Secondary | ICD-10-CM | POA: Diagnosis not present

## 2022-08-30 DIAGNOSIS — C155 Malignant neoplasm of lower third of esophagus: Secondary | ICD-10-CM | POA: Diagnosis not present

## 2022-08-30 NOTE — Progress Notes (Deleted)
  Subjective:     Patient ID: Douglas [name], male   DOB: 10/05/1955, 67 y.o.   MRN: [id number]  HPI   Review of Systems     Objective:   Physical Exam     Assessment:     ***    Plan:     ***

## 2022-08-30 NOTE — Progress Notes (Signed)
PATIENT NAME: Douglas [name] DOB: 1955/09/12 MRN: [id number]  PRIMARY CARE PROVIDER: Darreld Mclean, MD  RESPONSIBLE PARTY:  Acct ID - Guarantor Home Phone Work Phone Relationship Acct Type  [id number] Etheleen [phone]  Self P/F     [address], [place],  [address]   Palliative Care Follow Up Encounter Note   Completed home visit. [name] (wife) also present.     HISTORY OF PRESENT ILLNESS:    Respiratory: no SOB or other issues at this time  CA: begins radiation for secondary CA on 3.14.24  Cognitive: alert and oriented   Appetite: 4 nutritional tube feed drinks daily; eats snacks by mouth; usually gets 461m free water daily; drinks about 4 cups of water by mouth daily  GI/GU: pt reports daily BM; no urinary issues noted   Mobility: independent  ADLs: independent  Pain: 3/10 bone pain  Wt: pt reports his wt is stabilizing; 3 weeks ago 128lbs; 2 weeks ago 130lbs and on Sunday, 08/27/22 129.6lbs   Goals of Care: To stay in the home    CODE STATUS: Full Code ADVANCED DIRECTIVES: N MOST FORM: N PPS: 80%   PHYSICAL EXAM:   VITALS: Today's Vitals   08/29/22 1423  BP: 96/72  Pulse: 86  Temp: 98 F (36.7 C)  TempSrc: Temporal  SpO2: 97%  PainSc: 3   PainLoc: Generalized    LUNGS: clear to auscultation  CARDIAC: Cor RRR EXTREMITIES: normal SKIN: Skin color, texture, turgor normal. No rashes or lesions  NEURO: negative   Pt reports he is feeling much better than he did a few weeks ago. Pt would like to push visits to every 6 weeks unless the radiation requires visits with shorter intervals.    Shelvia Fojtik CGeorgann Housekeeper LPN

## 2022-08-31 DIAGNOSIS — Z510 Encounter for antineoplastic radiation therapy: Secondary | ICD-10-CM | POA: Diagnosis not present

## 2022-08-31 DIAGNOSIS — C155 Malignant neoplasm of lower third of esophagus: Secondary | ICD-10-CM | POA: Diagnosis not present

## 2022-09-01 DIAGNOSIS — Z510 Encounter for antineoplastic radiation therapy: Secondary | ICD-10-CM | POA: Diagnosis not present

## 2022-09-01 DIAGNOSIS — C155 Malignant neoplasm of lower third of esophagus: Secondary | ICD-10-CM | POA: Diagnosis not present

## 2022-09-04 DIAGNOSIS — C155 Malignant neoplasm of lower third of esophagus: Secondary | ICD-10-CM | POA: Diagnosis not present

## 2022-09-04 DIAGNOSIS — Z510 Encounter for antineoplastic radiation therapy: Secondary | ICD-10-CM | POA: Diagnosis not present

## 2022-09-05 DIAGNOSIS — Z510 Encounter for antineoplastic radiation therapy: Secondary | ICD-10-CM | POA: Diagnosis not present

## 2022-09-05 DIAGNOSIS — C155 Malignant neoplasm of lower third of esophagus: Secondary | ICD-10-CM | POA: Diagnosis not present

## 2022-09-06 DIAGNOSIS — Z510 Encounter for antineoplastic radiation therapy: Secondary | ICD-10-CM | POA: Diagnosis not present

## 2022-09-06 DIAGNOSIS — C155 Malignant neoplasm of lower third of esophagus: Secondary | ICD-10-CM | POA: Diagnosis not present

## 2022-09-07 DIAGNOSIS — Z510 Encounter for antineoplastic radiation therapy: Secondary | ICD-10-CM | POA: Diagnosis not present

## 2022-09-07 DIAGNOSIS — C155 Malignant neoplasm of lower third of esophagus: Secondary | ICD-10-CM | POA: Diagnosis not present

## 2022-09-08 DIAGNOSIS — C155 Malignant neoplasm of lower third of esophagus: Secondary | ICD-10-CM | POA: Diagnosis not present

## 2022-09-08 DIAGNOSIS — Z510 Encounter for antineoplastic radiation therapy: Secondary | ICD-10-CM | POA: Diagnosis not present

## 2022-09-10 ENCOUNTER — Encounter: Payer: Self-pay | Admitting: Family Medicine

## 2022-09-10 DIAGNOSIS — C641 Malignant neoplasm of right kidney, except renal pelvis: Secondary | ICD-10-CM

## 2022-09-10 DIAGNOSIS — G893 Neoplasm related pain (acute) (chronic): Secondary | ICD-10-CM

## 2022-09-11 DIAGNOSIS — C155 Malignant neoplasm of lower third of esophagus: Secondary | ICD-10-CM | POA: Diagnosis not present

## 2022-09-11 DIAGNOSIS — Z510 Encounter for antineoplastic radiation therapy: Secondary | ICD-10-CM | POA: Diagnosis not present

## 2022-09-11 MED ORDER — HYDROCODONE-ACETAMINOPHEN 7.5-325 MG PO TABS: 2.0000 | ORAL_TABLET | ORAL | 0 refills | Status: DC

## 2022-09-12 DIAGNOSIS — C155 Malignant neoplasm of lower third of esophagus: Secondary | ICD-10-CM | POA: Diagnosis not present

## 2022-09-12 DIAGNOSIS — Z510 Encounter for antineoplastic radiation therapy: Secondary | ICD-10-CM | POA: Diagnosis not present

## 2022-09-13 DIAGNOSIS — C155 Malignant neoplasm of lower third of esophagus: Secondary | ICD-10-CM | POA: Diagnosis not present

## 2022-09-13 DIAGNOSIS — Z510 Encounter for antineoplastic radiation therapy: Secondary | ICD-10-CM | POA: Diagnosis not present

## 2022-09-28 DIAGNOSIS — R002 Palpitations: Secondary | ICD-10-CM | POA: Diagnosis not present

## 2022-09-28 DIAGNOSIS — C7931 Secondary malignant neoplasm of brain: Secondary | ICD-10-CM | POA: Diagnosis not present

## 2022-09-28 DIAGNOSIS — Z1509 Genetic susceptibility to other malignant neoplasm: Secondary | ICD-10-CM | POA: Diagnosis not present

## 2022-09-28 DIAGNOSIS — D649 Anemia, unspecified: Secondary | ICD-10-CM | POA: Diagnosis not present

## 2022-09-28 DIAGNOSIS — Z5111 Encounter for antineoplastic chemotherapy: Secondary | ICD-10-CM | POA: Diagnosis not present

## 2022-09-28 DIAGNOSIS — M4800 Spinal stenosis, site unspecified: Secondary | ICD-10-CM | POA: Diagnosis not present

## 2022-09-28 DIAGNOSIS — C649 Malignant neoplasm of unspecified kidney, except renal pelvis: Secondary | ICD-10-CM | POA: Diagnosis not present

## 2022-09-28 DIAGNOSIS — Z5112 Encounter for antineoplastic immunotherapy: Secondary | ICD-10-CM | POA: Diagnosis not present

## 2022-09-28 DIAGNOSIS — M2559 Pain in other specified joint: Secondary | ICD-10-CM | POA: Diagnosis not present

## 2022-09-28 DIAGNOSIS — C7951 Secondary malignant neoplasm of bone: Secondary | ICD-10-CM | POA: Diagnosis not present

## 2022-09-28 DIAGNOSIS — R1310 Dysphagia, unspecified: Secondary | ICD-10-CM | POA: Diagnosis not present

## 2022-10-06 ENCOUNTER — Other Ambulatory Visit: Payer: Self-pay | Admitting: Family Medicine

## 2022-10-06 DIAGNOSIS — I10 Essential (primary) hypertension: Secondary | ICD-10-CM

## 2022-10-06 DIAGNOSIS — C649 Malignant neoplasm of unspecified kidney, except renal pelvis: Secondary | ICD-10-CM | POA: Diagnosis not present

## 2022-10-06 DIAGNOSIS — E860 Dehydration: Secondary | ICD-10-CM | POA: Diagnosis not present

## 2022-10-06 DIAGNOSIS — C159 Malignant neoplasm of esophagus, unspecified: Secondary | ICD-10-CM | POA: Diagnosis not present

## 2022-10-07 ENCOUNTER — Other Ambulatory Visit: Payer: Self-pay | Admitting: Family Medicine

## 2022-10-07 DIAGNOSIS — G893 Neoplasm related pain (acute) (chronic): Secondary | ICD-10-CM

## 2022-10-07 DIAGNOSIS — F329 Major depressive disorder, single episode, unspecified: Secondary | ICD-10-CM

## 2022-10-09 ENCOUNTER — Encounter: Payer: Self-pay | Admitting: Family Medicine

## 2022-10-09 DIAGNOSIS — C641 Malignant neoplasm of right kidney, except renal pelvis: Secondary | ICD-10-CM

## 2022-10-09 DIAGNOSIS — G893 Neoplasm related pain (acute) (chronic): Secondary | ICD-10-CM

## 2022-10-09 MED ORDER — HYDROCODONE-ACETAMINOPHEN 7.5-325 MG PO TABS: 2.0000 | ORAL_TABLET | ORAL | 0 refills | Status: DC

## 2022-10-10 ENCOUNTER — Other Ambulatory Visit: Payer: Medicare HMO

## 2022-10-10 VITALS — BP 108/78 | HR 78 | Temp 98.1°F | Resp 18

## 2022-10-10 DIAGNOSIS — Z515 Encounter for palliative care: Secondary | ICD-10-CM

## 2022-10-10 NOTE — Progress Notes (Signed)
PATIENT NAME: Douglas [name] DOB: June 30, 1955 MRN: [id number]  PRIMARY CARE PROVIDER: Pearline Cables, MD  RESPONSIBLE PARTY:  Acct ID - Guarantor Home Phone Work Phone Relationship Acct Type  [id number] Douglas [name]* [phone]  Self P/F     [address], [place], [place] [address]   Palliative Care Follow Up Encounter Note    Completed home visit. Douglas [name] (wife) also present.     HISTORY OF PRESENT ILLNESS:     Respiratory: no SOB or other issues at this time   CA: continues radiation for secondary CA   Cognitive: alert and oriented   Appetite: 5 nutritional tube feed drinks daily; no longer eating snacks by mouth; usually gets free water daily; drinks about 1 cup of water by mouth daily; pt reports at this time food repulses him; he has been trying to eat popsicles   GI/GU: pt reports daily BM; no urinary issues noted   Mobility: independent   ADLs: independent   Pain: 4/69 bone, shoulder, neck and knee pain; pt reports this is his ideal pain level   Wt: pt reports his wt is stabilizing; 9 weeks ago 128lbs; 8 weeks ago 130lbs and on Sunday, 08/27/22 129.6lbs; on 10/08/22 128 lbs     Goals of Care: To stay in the home     CODE STATUS: Full Code ADVANCED DIRECTIVES: N MOST FORM: N PPS: 80%    PHYSICAL EXAM:   VITALS: Today's Vitals   10/10/22 1358  BP: 108/78  Pulse: 78  Resp: 18  Temp: 98.1 F (36.7 C)  TempSrc: Temporal  SpO2: 98%  PainSc: 3   PainLoc: Shoulder    LUNGS: clear to auscultation  CARDIAC: Cor RRR EXTREMITIES: AROM x4 SKIN: Skin color, texture, turgor normal. No rashes or lesions , pt is looking for acne because it's a good sign that the Douglas [name] is doing it's job NEURO: negative except for dizziness and weakness     Next scheduled visit: 11/01/22 @ 2:30pm   Douglas Tourigny Clementeen Graham, LPN

## 2022-10-12 DIAGNOSIS — Z5112 Encounter for antineoplastic immunotherapy: Secondary | ICD-10-CM | POA: Diagnosis not present

## 2022-10-12 DIAGNOSIS — C7951 Secondary malignant neoplasm of bone: Secondary | ICD-10-CM | POA: Diagnosis not present

## 2022-10-12 DIAGNOSIS — Z5111 Encounter for antineoplastic chemotherapy: Secondary | ICD-10-CM | POA: Diagnosis not present

## 2022-10-12 DIAGNOSIS — Z1509 Genetic susceptibility to other malignant neoplasm: Secondary | ICD-10-CM | POA: Diagnosis not present

## 2022-10-12 DIAGNOSIS — C649 Malignant neoplasm of unspecified kidney, except renal pelvis: Secondary | ICD-10-CM | POA: Diagnosis not present

## 2022-10-13 ENCOUNTER — Telehealth: Payer: Self-pay | Admitting: Family Medicine

## 2022-10-13 NOTE — Telephone Encounter (Signed)
Copied from CRM [id number]. Topic: Medicare AWV >> Oct 13, 2022 10:03 AM Payton Doughty wrote: Reason for CRM: Called patient to schedule Medicare Annual Wellness Visit (AWV). Left message for patient to call back and schedule Medicare Annual Wellness Visit (AWV).  Last date of AWV: 03/09/21  Please schedule an appointment at any time with Donne Anon, CMA  .  If any questions, please contact me.  Thank you ,  Verlee Rossetti; Care Guide Ambulatory Clinical Support [place] [facility] Direct Dial: [phone]

## 2022-10-24 DIAGNOSIS — Z1509 Genetic susceptibility to other malignant neoplasm: Secondary | ICD-10-CM | POA: Diagnosis not present

## 2022-10-24 DIAGNOSIS — C160 Malignant neoplasm of cardia: Secondary | ICD-10-CM | POA: Diagnosis not present

## 2022-10-26 DIAGNOSIS — D630 Anemia in neoplastic disease: Secondary | ICD-10-CM | POA: Diagnosis not present

## 2022-10-26 DIAGNOSIS — M4800 Spinal stenosis, site unspecified: Secondary | ICD-10-CM | POA: Diagnosis not present

## 2022-10-26 DIAGNOSIS — R1310 Dysphagia, unspecified: Secondary | ICD-10-CM | POA: Diagnosis not present

## 2022-10-26 DIAGNOSIS — M25549 Pain in joints of unspecified hand: Secondary | ICD-10-CM | POA: Diagnosis not present

## 2022-10-26 DIAGNOSIS — Z5111 Encounter for antineoplastic chemotherapy: Secondary | ICD-10-CM | POA: Diagnosis not present

## 2022-10-26 DIAGNOSIS — C7989 Secondary malignant neoplasm of other specified sites: Secondary | ICD-10-CM | POA: Diagnosis not present

## 2022-10-26 DIAGNOSIS — C649 Malignant neoplasm of unspecified kidney, except renal pelvis: Secondary | ICD-10-CM | POA: Diagnosis not present

## 2022-10-26 DIAGNOSIS — M25569 Pain in unspecified knee: Secondary | ICD-10-CM | POA: Diagnosis not present

## 2022-10-26 DIAGNOSIS — M25561 Pain in right knee: Secondary | ICD-10-CM | POA: Diagnosis not present

## 2022-10-26 DIAGNOSIS — Z1509 Genetic susceptibility to other malignant neoplasm: Secondary | ICD-10-CM | POA: Diagnosis not present

## 2022-10-26 DIAGNOSIS — R634 Abnormal weight loss: Secondary | ICD-10-CM | POA: Diagnosis not present

## 2022-10-26 DIAGNOSIS — C7951 Secondary malignant neoplasm of bone: Secondary | ICD-10-CM | POA: Diagnosis not present

## 2022-10-27 DIAGNOSIS — N1831 Chronic kidney disease, stage 3a: Secondary | ICD-10-CM | POA: Diagnosis not present

## 2022-10-27 DIAGNOSIS — R809 Proteinuria, unspecified: Secondary | ICD-10-CM | POA: Diagnosis not present

## 2022-11-01 ENCOUNTER — Other Ambulatory Visit: Payer: Medicare HMO

## 2022-11-02 DIAGNOSIS — Z1509 Genetic susceptibility to other malignant neoplasm: Secondary | ICD-10-CM | POA: Diagnosis not present

## 2022-11-02 DIAGNOSIS — I2510 Atherosclerotic heart disease of native coronary artery without angina pectoris: Secondary | ICD-10-CM | POA: Diagnosis not present

## 2022-11-02 DIAGNOSIS — R809 Proteinuria, unspecified: Secondary | ICD-10-CM | POA: Diagnosis not present

## 2022-11-02 DIAGNOSIS — R5383 Other fatigue: Secondary | ICD-10-CM | POA: Diagnosis not present

## 2022-11-02 DIAGNOSIS — I129 Hypertensive chronic kidney disease with stage 1 through stage 4 chronic kidney disease, or unspecified chronic kidney disease: Secondary | ICD-10-CM | POA: Diagnosis not present

## 2022-11-02 DIAGNOSIS — R1310 Dysphagia, unspecified: Secondary | ICD-10-CM | POA: Diagnosis not present

## 2022-11-02 DIAGNOSIS — N1831 Chronic kidney disease, stage 3a: Secondary | ICD-10-CM | POA: Diagnosis not present

## 2022-11-02 DIAGNOSIS — F32A Depression, unspecified: Secondary | ICD-10-CM | POA: Diagnosis not present

## 2022-11-02 DIAGNOSIS — E872 Acidosis, unspecified: Secondary | ICD-10-CM | POA: Diagnosis not present

## 2022-11-03 ENCOUNTER — Other Ambulatory Visit: Payer: Self-pay | Admitting: Family Medicine

## 2022-11-03 DIAGNOSIS — I10 Essential (primary) hypertension: Secondary | ICD-10-CM

## 2022-11-06 DIAGNOSIS — E860 Dehydration: Secondary | ICD-10-CM | POA: Diagnosis not present

## 2022-11-06 DIAGNOSIS — C649 Malignant neoplasm of unspecified kidney, except renal pelvis: Secondary | ICD-10-CM | POA: Diagnosis not present

## 2022-11-06 DIAGNOSIS — C159 Malignant neoplasm of esophagus, unspecified: Secondary | ICD-10-CM | POA: Diagnosis not present

## 2022-11-06 DIAGNOSIS — R1310 Dysphagia, unspecified: Secondary | ICD-10-CM | POA: Diagnosis not present

## 2022-11-07 ENCOUNTER — Telehealth: Payer: Self-pay

## 2022-11-07 NOTE — Telephone Encounter (Signed)
error 

## 2022-11-09 ENCOUNTER — Encounter: Payer: Self-pay | Admitting: Family Medicine

## 2022-11-09 DIAGNOSIS — R1310 Dysphagia, unspecified: Secondary | ICD-10-CM | POA: Diagnosis not present

## 2022-11-09 DIAGNOSIS — Z5111 Encounter for antineoplastic chemotherapy: Secondary | ICD-10-CM | POA: Diagnosis not present

## 2022-11-09 DIAGNOSIS — G893 Neoplasm related pain (acute) (chronic): Secondary | ICD-10-CM

## 2022-11-09 DIAGNOSIS — C649 Malignant neoplasm of unspecified kidney, except renal pelvis: Secondary | ICD-10-CM | POA: Diagnosis not present

## 2022-11-09 DIAGNOSIS — Z1509 Genetic susceptibility to other malignant neoplasm: Secondary | ICD-10-CM | POA: Diagnosis not present

## 2022-11-09 DIAGNOSIS — C641 Malignant neoplasm of right kidney, except renal pelvis: Secondary | ICD-10-CM

## 2022-11-09 DIAGNOSIS — F329 Major depressive disorder, single episode, unspecified: Secondary | ICD-10-CM

## 2022-11-09 MED ORDER — HYDROCODONE-ACETAMINOPHEN 7.5-325 MG PO TABS: 2.0000 | ORAL_TABLET | ORAL | 0 refills | Status: DC

## 2022-11-17 ENCOUNTER — Other Ambulatory Visit: Payer: Medicare HMO

## 2022-11-17 VITALS — BP 102/78 | HR 83 | Temp 98.0°F

## 2022-11-17 DIAGNOSIS — Z515 Encounter for palliative care: Secondary | ICD-10-CM

## 2022-11-17 NOTE — Progress Notes (Signed)
PATIENT NAME: Douglas [name] DOB: 06-Mar-1956 MRN: [id number]  PRIMARY CARE PROVIDER: Pearline Cables, MD  RESPONSIBLE PARTY:  Acct ID - Guarantor Home Phone Work Phone Relationship Acct Type  [id number] [name]* [phone]  Self P/F     [address], [place], [place] [address]    Palliative Care Follow Up Encounter Note    Completed home visit.      HISTORY OF PRESENT ILLNESS:     Respiratory: no SOB or other issues at this time   CA: continues weekly radiation for secondary CA   Cognitive: alert and oriented   Appetite: now has a different nutritional tube feed drink 4x daily; NPO; usually gets free water daily; drinks some water by mouth daily; pt reports at this time food still repulses him; he has been trying to eat popsicles but it's not working because of the added citric acid   GI/GU: pt reports daily BM; no urinary issues noted   Mobility: independent   ADLs: independent   Wt: pt reports his wt is stabilizing; 9 weeks ago 128lbs; 8 weeks ago 130lbs and on Sunday, 08/27/22 129.6lbs; on 10/08/22 128 lbs; pt hasn't weighed himself because it makes him anxious since he wants to gain weight     Goals of Care: To stay in the home     CODE STATUS: Full Code ADVANCED DIRECTIVES: N  MOST FORM: N PPS: 80%     PHYSICAL EXAM:   VITALS: Today's Vitals   11/17/22 1242  BP: 102/78  Pulse: 83  Temp: 98 F (36.7 C)  TempSrc: Temporal  SpO2: 97%  PainSc: 4   PainLoc: Shoulder    LUNGS: clear to auscultation , decreased breath sounds CARDIAC: Cor RRR EXTREMITIES: AROM x4 SKIN: Skin color, texture, turgor normal. No rashes or lesions  NEURO: negative except for weakness PAIN: pt rates pain level at 4/10 to his L shoulder, joints and stomach (area when CA is located)   Next appt: 12/26/22 @ 1:30pm   Aubrey Blackard Clementeen Graham, LPN

## 2022-11-18 ENCOUNTER — Other Ambulatory Visit: Payer: Self-pay | Admitting: Family Medicine

## 2022-11-18 DIAGNOSIS — R110 Nausea: Secondary | ICD-10-CM

## 2022-11-19 ENCOUNTER — Other Ambulatory Visit: Payer: Self-pay | Admitting: Family Medicine

## 2022-11-19 DIAGNOSIS — I10 Essential (primary) hypertension: Secondary | ICD-10-CM

## 2022-11-21 MED ORDER — LORAZEPAM 1 MG PO TABS: 0.5000 mg | ORAL_TABLET | Freq: Every evening | ORAL | 3 refills | Status: DC | PRN

## 2022-11-21 NOTE — Addendum Note (Signed)
Addended by: Abbe Amsterdam C on: 11/21/2022 03:08 PM   Modules accepted: Orders

## 2022-11-23 ENCOUNTER — Encounter: Payer: Self-pay | Admitting: Family Medicine

## 2022-11-23 DIAGNOSIS — M4800 Spinal stenosis, site unspecified: Secondary | ICD-10-CM | POA: Diagnosis not present

## 2022-11-23 DIAGNOSIS — M2559 Pain in other specified joint: Secondary | ICD-10-CM | POA: Diagnosis not present

## 2022-11-23 DIAGNOSIS — G893 Neoplasm related pain (acute) (chronic): Secondary | ICD-10-CM

## 2022-11-23 DIAGNOSIS — C160 Malignant neoplasm of cardia: Secondary | ICD-10-CM | POA: Diagnosis not present

## 2022-11-23 DIAGNOSIS — Z1509 Genetic susceptibility to other malignant neoplasm: Secondary | ICD-10-CM | POA: Diagnosis not present

## 2022-11-23 DIAGNOSIS — D649 Anemia, unspecified: Secondary | ICD-10-CM | POA: Diagnosis not present

## 2022-11-23 DIAGNOSIS — R1310 Dysphagia, unspecified: Secondary | ICD-10-CM | POA: Diagnosis not present

## 2022-11-23 DIAGNOSIS — C7951 Secondary malignant neoplasm of bone: Secondary | ICD-10-CM | POA: Diagnosis not present

## 2022-11-23 DIAGNOSIS — Z931 Gastrostomy status: Secondary | ICD-10-CM | POA: Diagnosis not present

## 2022-11-23 DIAGNOSIS — Z5111 Encounter for antineoplastic chemotherapy: Secondary | ICD-10-CM | POA: Diagnosis not present

## 2022-11-23 DIAGNOSIS — C641 Malignant neoplasm of right kidney, except renal pelvis: Secondary | ICD-10-CM

## 2022-11-23 DIAGNOSIS — C649 Malignant neoplasm of unspecified kidney, except renal pelvis: Secondary | ICD-10-CM | POA: Diagnosis not present

## 2022-11-23 MED ORDER — PAROXETINE HCL 10 MG PO TABS: 10.0000 mg | ORAL_TABLET | Freq: Every day | ORAL | 3 refills | Status: DC

## 2022-11-23 NOTE — Addendum Note (Signed)
Addended by: Abbe Amsterdam C on: 11/23/2022 10:15 AM   Modules accepted: Orders

## 2022-11-25 NOTE — Telephone Encounter (Signed)
Called Douglas [name] back-listened to his concerns about Douglas [name].  I sent a message to the resident psychiatrist who I spoke with recently, suggested that a family meeting might be helpful

## 2022-11-27 DIAGNOSIS — E860 Dehydration: Secondary | ICD-10-CM | POA: Diagnosis not present

## 2022-11-27 DIAGNOSIS — C649 Malignant neoplasm of unspecified kidney, except renal pelvis: Secondary | ICD-10-CM | POA: Diagnosis not present

## 2022-11-27 DIAGNOSIS — C159 Malignant neoplasm of esophagus, unspecified: Secondary | ICD-10-CM | POA: Diagnosis not present

## 2022-11-27 DIAGNOSIS — R1310 Dysphagia, unspecified: Secondary | ICD-10-CM | POA: Diagnosis not present

## 2022-12-01 ENCOUNTER — Telehealth: Payer: Self-pay | Admitting: Gastroenterology

## 2022-12-01 NOTE — Telephone Encounter (Signed)
Thanks Liz Beach, agree.  [place] this patient needs to be seen in the office by me or one of the APPs in the next few weeks if possible, if you can help coordinate.

## 2022-12-01 NOTE — Telephone Encounter (Signed)
Patient has a referral in for Dysphasia. Please review and advise on scheduling

## 2022-12-01 NOTE — Telephone Encounter (Signed)
This is 1 of Dr. Lanetta Inch patients. Patient needs further evaluation and needs to be seen in clinic.  Recommend patient be seen by Dr. Adela Lank or one of the APP's. Question is esophageal stenting in the setting of reported esophageal cancer. We would need to have a clear outline from the patient's oncologist as to what the plan is in regards to radiation oncology or with the thoughts would be about permanent stenting or not permanent stenting. Patient also would need a barium swallow if could tolerate, though not clear as he needed a PEG tube that was ordered by Dr. Adela Lank. Thus again patient needs to be seen in clinic first before any consideration of esophageal stenting. Thanks. GM

## 2022-12-02 DIAGNOSIS — E860 Dehydration: Secondary | ICD-10-CM | POA: Diagnosis not present

## 2022-12-02 DIAGNOSIS — C159 Malignant neoplasm of esophagus, unspecified: Secondary | ICD-10-CM | POA: Diagnosis not present

## 2022-12-02 DIAGNOSIS — C649 Malignant neoplasm of unspecified kidney, except renal pelvis: Secondary | ICD-10-CM | POA: Diagnosis not present

## 2022-12-02 DIAGNOSIS — R1310 Dysphagia, unspecified: Secondary | ICD-10-CM | POA: Diagnosis not present

## 2022-12-03 DIAGNOSIS — C649 Malignant neoplasm of unspecified kidney, except renal pelvis: Secondary | ICD-10-CM | POA: Diagnosis not present

## 2022-12-03 DIAGNOSIS — R1310 Dysphagia, unspecified: Secondary | ICD-10-CM | POA: Diagnosis not present

## 2022-12-03 DIAGNOSIS — C159 Malignant neoplasm of esophagus, unspecified: Secondary | ICD-10-CM | POA: Diagnosis not present

## 2022-12-03 DIAGNOSIS — E860 Dehydration: Secondary | ICD-10-CM | POA: Diagnosis not present

## 2022-12-04 DIAGNOSIS — E860 Dehydration: Secondary | ICD-10-CM | POA: Diagnosis not present

## 2022-12-04 DIAGNOSIS — C649 Malignant neoplasm of unspecified kidney, except renal pelvis: Secondary | ICD-10-CM | POA: Diagnosis not present

## 2022-12-04 DIAGNOSIS — R1310 Dysphagia, unspecified: Secondary | ICD-10-CM | POA: Diagnosis not present

## 2022-12-04 DIAGNOSIS — C159 Malignant neoplasm of esophagus, unspecified: Secondary | ICD-10-CM | POA: Diagnosis not present

## 2022-12-04 NOTE — Telephone Encounter (Signed)
Called and spoke with patient regarding scheduling an office visit for further evaluation. I offered patient an appt this week, but he already has several appts. Patient has been scheduled for an OV with Hyacinth Meeker, PA-C on Wednesday, 12/13/22 at 1:30 pm. Pt verbalized understanding and had no concerns at the end of the call.

## 2022-12-05 DIAGNOSIS — C159 Malignant neoplasm of esophagus, unspecified: Secondary | ICD-10-CM | POA: Diagnosis not present

## 2022-12-05 DIAGNOSIS — C787 Secondary malignant neoplasm of liver and intrahepatic bile duct: Secondary | ICD-10-CM | POA: Diagnosis not present

## 2022-12-05 DIAGNOSIS — R1310 Dysphagia, unspecified: Secondary | ICD-10-CM | POA: Diagnosis not present

## 2022-12-05 DIAGNOSIS — Z79899 Other long term (current) drug therapy: Secondary | ICD-10-CM | POA: Diagnosis not present

## 2022-12-05 DIAGNOSIS — J439 Emphysema, unspecified: Secondary | ICD-10-CM | POA: Diagnosis not present

## 2022-12-05 DIAGNOSIS — C7951 Secondary malignant neoplasm of bone: Secondary | ICD-10-CM | POA: Diagnosis not present

## 2022-12-05 DIAGNOSIS — I700 Atherosclerosis of aorta: Secondary | ICD-10-CM | POA: Diagnosis not present

## 2022-12-05 DIAGNOSIS — R911 Solitary pulmonary nodule: Secondary | ICD-10-CM | POA: Diagnosis not present

## 2022-12-05 DIAGNOSIS — E860 Dehydration: Secondary | ICD-10-CM | POA: Diagnosis not present

## 2022-12-05 DIAGNOSIS — C649 Malignant neoplasm of unspecified kidney, except renal pelvis: Secondary | ICD-10-CM | POA: Diagnosis not present

## 2022-12-05 DIAGNOSIS — Z1509 Genetic susceptibility to other malignant neoplasm: Secondary | ICD-10-CM | POA: Diagnosis not present

## 2022-12-06 DIAGNOSIS — E860 Dehydration: Secondary | ICD-10-CM | POA: Diagnosis not present

## 2022-12-06 DIAGNOSIS — C159 Malignant neoplasm of esophagus, unspecified: Secondary | ICD-10-CM | POA: Diagnosis not present

## 2022-12-06 DIAGNOSIS — C649 Malignant neoplasm of unspecified kidney, except renal pelvis: Secondary | ICD-10-CM | POA: Diagnosis not present

## 2022-12-06 DIAGNOSIS — R1310 Dysphagia, unspecified: Secondary | ICD-10-CM | POA: Diagnosis not present

## 2022-12-06 MED ORDER — HYDROCODONE-ACETAMINOPHEN 7.5-325 MG PO TABS: 2.0000 | ORAL_TABLET | ORAL | 0 refills | Status: DC

## 2022-12-06 NOTE — Telephone Encounter (Signed)
Called pt- he noted he needs his pain medication filled this coming Saturday- hwoever, he is afraid he will run out early  He has been waking up at about 4am many days with pain from his esophagus and will take one hydrocodone   He has a feeding tube and is on 100% dependent on the tube for nutrition and fluids- he notes at this point he "cannot even eat a possible"  He wonders if his hydrocodone could be increased by 1 pill per day = 290 = 9.6 pills daily He is allowed 2 of the 7.5 mg pills every 4 hours Pt has end stage metastatic cancer, this increase in pain control is reasonable

## 2022-12-06 NOTE — Addendum Note (Signed)
Addended by: Abbe Amsterdam C on: 12/06/2022 04:57 PM   Modules accepted: Orders

## 2022-12-07 ENCOUNTER — Ambulatory Visit: Payer: Medicare HMO | Admitting: Nurse Practitioner

## 2022-12-07 DIAGNOSIS — R1310 Dysphagia, unspecified: Secondary | ICD-10-CM | POA: Diagnosis not present

## 2022-12-07 DIAGNOSIS — Z1509 Genetic susceptibility to other malignant neoplasm: Secondary | ICD-10-CM | POA: Diagnosis not present

## 2022-12-07 DIAGNOSIS — C649 Malignant neoplasm of unspecified kidney, except renal pelvis: Secondary | ICD-10-CM | POA: Diagnosis not present

## 2022-12-07 DIAGNOSIS — K769 Liver disease, unspecified: Secondary | ICD-10-CM | POA: Diagnosis not present

## 2022-12-07 DIAGNOSIS — C160 Malignant neoplasm of cardia: Secondary | ICD-10-CM | POA: Diagnosis not present

## 2022-12-07 DIAGNOSIS — E860 Dehydration: Secondary | ICD-10-CM | POA: Diagnosis not present

## 2022-12-07 DIAGNOSIS — C159 Malignant neoplasm of esophagus, unspecified: Secondary | ICD-10-CM | POA: Diagnosis not present

## 2022-12-08 ENCOUNTER — Telehealth: Payer: Self-pay | Admitting: *Deleted

## 2022-12-08 DIAGNOSIS — C649 Malignant neoplasm of unspecified kidney, except renal pelvis: Secondary | ICD-10-CM | POA: Diagnosis not present

## 2022-12-08 DIAGNOSIS — C159 Malignant neoplasm of esophagus, unspecified: Secondary | ICD-10-CM | POA: Diagnosis not present

## 2022-12-08 DIAGNOSIS — E860 Dehydration: Secondary | ICD-10-CM | POA: Diagnosis not present

## 2022-12-08 DIAGNOSIS — R1310 Dysphagia, unspecified: Secondary | ICD-10-CM | POA: Diagnosis not present

## 2022-12-08 NOTE — Progress Notes (Signed)
  Care Coordination   Note   12/08/2022 Name: Douglas [name] MRN: [id number] DOB: December 03, 1955  Douglas [name] is a 67 y.o. year old male who sees Copland, Gwenlyn Found, MD for primary care. I reached out to [name] by phone today to offer care coordination services.  [name] was given information about Care Coordination services today including:   The Care Coordination services include support from the care team which includes your Nurse Coordinator, Clinical Social Worker, or Pharmacist.  The Care Coordination team is here to help remove barriers to the health concerns and goals most important to you. Care Coordination services are voluntary, and the patient may decline or stop services at any time by request to their care team member.   Care Coordination Consent Status: Patient agreed to services and verbal consent obtained.   Follow up plan:  Telephone appointment with care coordination team member scheduled for:  12/18/2022  Encounter Outcome:  Pt. Scheduled from referral   Burman Nieves, [facility] Care Coordination Care Guide Direct Dial: [phone]

## 2022-12-09 DIAGNOSIS — R1310 Dysphagia, unspecified: Secondary | ICD-10-CM | POA: Diagnosis not present

## 2022-12-09 DIAGNOSIS — C159 Malignant neoplasm of esophagus, unspecified: Secondary | ICD-10-CM | POA: Diagnosis not present

## 2022-12-09 DIAGNOSIS — C649 Malignant neoplasm of unspecified kidney, except renal pelvis: Secondary | ICD-10-CM | POA: Diagnosis not present

## 2022-12-09 DIAGNOSIS — E860 Dehydration: Secondary | ICD-10-CM | POA: Diagnosis not present

## 2022-12-10 ENCOUNTER — Encounter: Payer: Self-pay | Admitting: Family Medicine

## 2022-12-10 DIAGNOSIS — E860 Dehydration: Secondary | ICD-10-CM | POA: Diagnosis not present

## 2022-12-10 DIAGNOSIS — C649 Malignant neoplasm of unspecified kidney, except renal pelvis: Secondary | ICD-10-CM | POA: Diagnosis not present

## 2022-12-10 DIAGNOSIS — C159 Malignant neoplasm of esophagus, unspecified: Secondary | ICD-10-CM | POA: Diagnosis not present

## 2022-12-10 DIAGNOSIS — R1310 Dysphagia, unspecified: Secondary | ICD-10-CM | POA: Diagnosis not present

## 2022-12-11 ENCOUNTER — Telehealth: Payer: Self-pay | Admitting: Family Medicine

## 2022-12-11 DIAGNOSIS — C159 Malignant neoplasm of esophagus, unspecified: Secondary | ICD-10-CM | POA: Diagnosis not present

## 2022-12-11 DIAGNOSIS — C649 Malignant neoplasm of unspecified kidney, except renal pelvis: Secondary | ICD-10-CM | POA: Diagnosis not present

## 2022-12-11 DIAGNOSIS — R1310 Dysphagia, unspecified: Secondary | ICD-10-CM | POA: Diagnosis not present

## 2022-12-11 DIAGNOSIS — E860 Dehydration: Secondary | ICD-10-CM | POA: Diagnosis not present

## 2022-12-11 MED ORDER — TRAZODONE HCL 50 MG PO TABS: 25.0000 mg | ORAL_TABLET | Freq: Every day | ORAL | 3 refills | Status: DC

## 2022-12-11 NOTE — Telephone Encounter (Signed)
Got it done!!!

## 2022-12-11 NOTE — Telephone Encounter (Signed)
Fast track hospice referral was dropped off for PCP to sign. Needs to be faxed back to number highlighted on bottom of form. Form is marked as urgent. Placed in PCP's tray.

## 2022-12-11 NOTE — Telephone Encounter (Signed)
Form is on your station in the clinic.

## 2022-12-12 ENCOUNTER — Other Ambulatory Visit (HOSPITAL_COMMUNITY): Payer: Self-pay | Admitting: Interventional Radiology

## 2022-12-12 ENCOUNTER — Ambulatory Visit (HOSPITAL_COMMUNITY)
Admission: RE | Admit: 2022-12-12 | Discharge: 2022-12-12 | Disposition: A | Discharge status: Home / Self Care | Payer: Medicare HMO | Source: Ambulatory Visit | Attending: Physician Assistant | Admitting: Physician Assistant

## 2022-12-12 ENCOUNTER — Other Ambulatory Visit (HOSPITAL_COMMUNITY): Payer: Medicare HMO

## 2022-12-12 ENCOUNTER — Other Ambulatory Visit (HOSPITAL_COMMUNITY): Payer: Self-pay | Admitting: Physician Assistant

## 2022-12-12 DIAGNOSIS — C155 Malignant neoplasm of lower third of esophagus: Secondary | ICD-10-CM

## 2022-12-12 DIAGNOSIS — C7951 Secondary malignant neoplasm of bone: Secondary | ICD-10-CM | POA: Diagnosis not present

## 2022-12-12 DIAGNOSIS — C159 Malignant neoplasm of esophagus, unspecified: Secondary | ICD-10-CM | POA: Diagnosis not present

## 2022-12-12 DIAGNOSIS — E860 Dehydration: Secondary | ICD-10-CM | POA: Diagnosis not present

## 2022-12-12 DIAGNOSIS — Z431 Encounter for attention to gastrostomy: Secondary | ICD-10-CM | POA: Insufficient documentation

## 2022-12-12 DIAGNOSIS — C160 Malignant neoplasm of cardia: Secondary | ICD-10-CM | POA: Diagnosis not present

## 2022-12-12 DIAGNOSIS — R1310 Dysphagia, unspecified: Secondary | ICD-10-CM | POA: Diagnosis not present

## 2022-12-12 DIAGNOSIS — C649 Malignant neoplasm of unspecified kidney, except renal pelvis: Secondary | ICD-10-CM | POA: Diagnosis not present

## 2022-12-12 HISTORY — PX: IR REPLC GASTRO/COLONIC TUBE PERCUT W/FLUORO: IMG2333

## 2022-12-12 MED ORDER — LIDOCAINE VISCOUS HCL 2 % MT SOLN
15.0000 mL | Freq: Once | OROMUCOSAL | Status: AC
  Administered 2022-12-12: 8 mL via OROMUCOSAL

## 2022-12-12 MED ORDER — IOHEXOL 300 MG/ML  SOLN
50.0000 mL | Freq: Once | INTRAMUSCULAR | Status: AC | PRN
  Administered 2022-12-12: 10 mL

## 2022-12-12 MED ORDER — LIDOCAINE VISCOUS HCL 2 % MT SOLN
OROMUCOSAL | Status: AC
  Filled 2022-12-12: qty 15

## 2022-12-13 ENCOUNTER — Encounter: Payer: Self-pay | Admitting: Physician Assistant

## 2022-12-13 ENCOUNTER — Telehealth: Payer: Self-pay | Admitting: Family Medicine

## 2022-12-13 ENCOUNTER — Ambulatory Visit (INDEPENDENT_AMBULATORY_CARE_PROVIDER_SITE_OTHER): Payer: Medicare HMO | Admitting: Physician Assistant

## 2022-12-13 VITALS — BP 90/70 | HR 88 | Ht 71.25 in | Wt 136.1 lb

## 2022-12-13 DIAGNOSIS — E860 Dehydration: Secondary | ICD-10-CM | POA: Diagnosis not present

## 2022-12-13 DIAGNOSIS — C159 Malignant neoplasm of esophagus, unspecified: Secondary | ICD-10-CM

## 2022-12-13 DIAGNOSIS — C649 Malignant neoplasm of unspecified kidney, except renal pelvis: Secondary | ICD-10-CM | POA: Diagnosis not present

## 2022-12-13 DIAGNOSIS — R1310 Dysphagia, unspecified: Secondary | ICD-10-CM | POA: Diagnosis not present

## 2022-12-13 NOTE — Telephone Encounter (Signed)
Beverly from [employer] [facility] states pt is denying hospice for now as he thought he would be able to do palliative care along with that. She stated he would like to speak with his oncologist to figure out if there are any other options regarding his POC. She anticipates he will probably be going into hospice within a month.

## 2022-12-13 NOTE — Patient Instructions (Addendum)
_______________________________________________________  If your blood pressure at your visit was 140/90 or greater, please contact your primary care physician to follow up on this.  _______________________________________________________  If you are age 68 or older, your body mass index should be between 23-30. Your Body mass index is 18.85 kg/m. If this is out of the aforementioned range listed, please consider follow up with your Primary Care Provider.  If you are age 44 or younger, your body mass index should be between 19-25. Your Body mass index is 18.85 kg/m. If this is out of the aformentioned range listed, please consider follow up with your Primary Care Provider.   ________________________________________________________  The [facility] providers would like to encourage you to use [facility] to communicate with providers for non-urgent requests or questions.  Due to long hold times on the telephone, sending your provider a message by [facility] may be a faster and more efficient way to get a response.  Please allow 48 business hours for a response.  Please remember that this is for non-urgent requests.    _______________________________________________________  Follow up as needed.  Thank you for entrusting me with your care and choosing [facility]. It was a pleasure to see you today!

## 2022-12-13 NOTE — Progress Notes (Signed)
Chief Complaint: Follow-up esophageal cancer and dysphagia  HPI:    Douglas [name] is a 67 year old male, known to Dr. Adela Lank, with a past medical history as listed below including esophageal cancer with mets to the liver and bone status post radiation and currently on chemo, COPD, GERD and multiple others, who was referred to me by Copland, Gwenlyn Found, MD for follow-up of esophageal cancer.    06/09/2022 EGD at [facility] with ulcer in the esophagus, stomach normal, duodenal bulb and second part of the duodenum appeared normal, unusual large and deep ulcer at the distal esophagus suspicious for neoplasm.    12/01/2022 phone message from Dr. Meridee Score.  That time noted that we needed to consider esophageal stenting in the setting of reported esophageal cancer.  Recommended a barium swallow if could tolerate.    12/07/2022 patient seen by heme-onc at [facility].  Noted that he had hereditary leiomyomatosis and renal cell cancer and was treated for 5 years since diagnosis and a clinical trial.  He had had some escalation in therapy.  (Please see their note).  He was having dysphagia and found to have a gastric tumor by EGD at the gastric junction.  He underwent radiation 3/14-3/27 and continues on chemotherapy.  PET scan did show progression to the liver.  At that time discussed that he was unable to eat, not even popsicles.  Unable to undergo an EGD and planned on a swallowing test but he is unable to swallow his own saliva.  At that visit was scheduled for a liver biopsy.    12/12/2022 patient had routine gastrostomy tube check and exchange.    Today, the patient tells me that he has battled cancer his entire life.  He is here because he was told he may be able to help him with his swallowing.  He tells me his cancer symptoms have gotten worse to the point where he cannot even swallow juice from a popsicle anymore and has a hard time swallowing his own secretions.  He has a gastrostomy tube  placed which was just exchanged yesterday.  His feedings were changed about 2 months ago as the other ones he had an allergic reaction to.  Since he has been changed his weight has stayed stable but prior to that it was declining quite quickly.  Patient wants to see if there is anything we can do to help him swallow food again or even just eat a popsicle.  Does tell me that if he eats anything citric he he gets severe pain at the base of his esophagus.    Also tries to take care of his wife at home who previously had brain cancer and after surgery developed some mental health issues.    Denies fever, chills, vomiting or symptoms that awaken him from sleep.  Past Medical History:  Diagnosis Date   Allergic rhinitis    Anxiety    Arthritis    "all over my body" (10/16/2013)   Cataract    Chronic back pain    "neck to lower back" (10/16/2013)   Colon polyps    Compression fracture    S/P L-SPINE; pt does not recall this hx on 10/16/2013   COPD (chronic obstructive pulmonary disease) (HCC)    FVC .66   Coronary artery disease    Depression    "situational since 09/22/2013 OR"   Encounter for long-term (current) use of other medications    GERD (gastroesophageal reflux disease)    Hemoptysis  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)    History of blood transfusion 09/2013   "think so; not sure; related to big OR"   History of surgical fusion joint    DDD C-SPINE   Hx of blood clots    Hyperlipidemia    Hypertension    Hypertensive retinopathy    Leiomyoma OF SKIN   INCREASED RISK RENAL CELL CA  NEEDS CT OF KIDNEYS EVERY 2 YEARS NEXT DUE  02/06/13   Melanoma in situ (HCC)    Melanoma of back (HCC)    Neuromuscular disorder (HCC)    Osteoarthritis    Renal calculi    Renal cell carcinoma (HCC)    type 2; papillary (metastized to bone)   Tobacco use disorder    Unspecified vitamin D deficiency     Past Surgical History:  Procedure Laterality Date   ANTERIOR  CERVICAL DECOMP/DISCECTOMY FUSION  1992   ANTERIOR CRUCIATE LIGAMENT REPAIR Bilateral [phone]   APPENDECTOMY  09/22/2013   CHOLECYSTECTOMY  09/22/2013   "had tumors in it"   CORONARY STENT INTERVENTION N/A 05/24/2020   Procedure: CORONARY STENT INTERVENTION;  Surgeon: Swaziland, Peter M, MD;  Location: [facility] INVASIVE CV LAB;  Service: Cardiovascular;  Laterality: N/A;   HAND LIGAMENT RECONSTRUCTION Right    HARVEST BONE GRAFT Right 1992   "hip; for neck fusion"   INGUINAL HERNIA REPAIR Right 2004   INGUINAL HERNIA REPAIR Left 12/28/2021   Procedure: OPEN LEFT INGUINAL HERNIA REPAIR;  Surgeon: Abigail Miyamoto, MD;  Location: [facility] OR;  Service: General;  Laterality: Left;   INSERTION OF MESH Left 12/28/2021   Procedure: INSERTION OF MESH;  Surgeon: Abigail Miyamoto, MD;  Location: [facility] OR;  Service: General;  Laterality: Left;   IR GASTROSTOMY TUBE MOD SED  07/13/2022   IR PATIENT EVAL TECH 0-60 MINS  07/14/2022   IR REPLC GASTRO/COLONIC TUBE PERCUT W/FLUORO  12/12/2022   LEFT HEART CATH AND CORONARY ANGIOGRAPHY N/A 05/24/2020   Procedure: LEFT HEART CATH AND CORONARY ANGIOGRAPHY;  Surgeon: Swaziland, Peter M, MD;  Location: [facility] INVASIVE CV LAB;  Service: Cardiovascular;  Laterality: N/A;   LIGAMENT REPAIR Right ~ 2001   "little finger"   LYMPH NODE DISSECTION Right 09/22/2013   "in the area of the kidney; they were clean"   MELANOMA EXCISION  ~ 2007   "off my back"   PARTIAL NEPHRECTOMY Right 09/22/2013   removal of fascia overlying the right psoas Right 09/22/2013   RENAL ARTERY STENT Right 09/22/2013   RIGHT COLECTOMY Right 09/22/2013   ROBOTIC ASSITED PARTIAL NEPHRECTOMY  05/22/2012   Procedure: ROBOTIC ASSITED PARTIAL NEPHRECTOMY;  Surgeon: Sebastian Ache, MD;  Location: [facility] ORS;  Service: Urology;  Laterality: Right;  Right Robotic Cyst Decortication and Partial Nephrectomy     Current Outpatient Medications  Medication Sig Dispense Refill   aspirin EC 81 MG tablet Take 1  tablet (81 mg total) by mouth daily. Swallow whole.     B Complex Vitamins (B COMPLEX 100 PO) Take 100 mg by mouth daily.     Bevacizumab (AVASTIN IV) Inject into the vein every 14 (fourteen) days.     Calcium Carb-Cholecalciferol (CALCIUM 600/VITAMIN D PO) Take 1 capsule by mouth daily.     carvedilol (COREG) 3.125 MG tablet TAKE 1 TABLET BY MOUTH TWICE A DAY WITH FOOD 180 tablet 2   Cetirizine-Pseudoephedrine (ZYRTEC-D PO) Take by mouth.     cholecalciferol (VITAMIN D3) 25 MCG (1000 UNIT) tablet Take 2,000 Units by  mouth daily.     cyclobenzaprine (FLEXERIL) 5 MG tablet Take 1 tablet (5 mg total) by mouth 3 (three) times daily as needed for muscle spasms. 30 tablet 0   erlotinib (TARCEVA) 150 MG tablet Take 150 mg by mouth daily. Take on an empty stomach 1 hour before meals or 2 hours after.     HYDROcodone-acetaminophen (NORCO) 7.5-325 MG tablet Take 2 tablets by mouth See admin instructions. Take 2 every 4 hours through out the day until taking ativan at bedtime, do not mix 290 tablet 0   loperamide (IMODIUM A-D) 2 MG tablet Take 2 mg by mouth 4 (four) times daily as needed for diarrhea or loose stools.     LORazepam (ATIVAN) 1 MG tablet Take 0.5-1 tablets (0.5-1 mg total) by mouth at bedtime as needed for sleep. TAKE ONE HALF OR ONE TABLET BY MOUTH AT BEDTIME IF NEEDED FOR SLEEP, DO NOT TAKE WITH PAIN MEDS 30 tablet 3   Multiple Vitamins-Minerals (CENTRUM SILVER 50+MEN PO) Take 1 tablet by mouth daily.     naloxone (NARCAN) nasal spray 4 mg/0.1 mL Use one spray in nostril every 2-3 minutes as needed for drug overdose (cancer patient on opioid therapy) 2 each PRN   nitroGLYCERIN (NITROSTAT) 0.4 MG SL tablet Place 1 tablet (0.4 mg total) under the tongue every 5 (five) minutes x 3 doses as needed for chest pain. Please dispense #25 x 4 bottle 100 tablet 1   OLANZapine (ZYPREXA) 5 MG tablet TAKE 1 TABLET BY MOUTH EVERYDAY AT BEDTIME 90 tablet 2   omeprazole (PRILOSEC) 40 MG capsule Take 40 mg by  mouth daily.     ondansetron (ZOFRAN-ODT) 4 MG disintegrating tablet TAKE 1 TABLET BY MOUTH SUBLINGUALLY EVERY 8 HOURS AS NEEDED FOR NAUSEA OR VOMITING 40 tablet 2   PARoxetine (PAXIL) 10 MG tablet Take 1 tablet (10 mg total) by mouth daily. Increase to 20 mg after 1 week if needed 60 tablet 3   Polyethyl Glycol-Propyl Glycol (SYSTANE OP) Place 1 drop into both eyes 2 (two) times daily as needed (dry eyes).     rosuvastatin (CRESTOR) 10 MG tablet TAKE 1 TABLET BY MOUTH EVERY DAY 90 tablet 3   sacubitril-valsartan (ENTRESTO) 24-26 MG Take 1 tablet by mouth 2 (two) times daily. 180 tablet 3   sodium bicarbonate 650 MG tablet Take 650 mg by mouth daily.     sodium chloride (OCEAN) 0.65 % SOLN nasal spray Place 1 spray into both nostrils at bedtime.     traZODone (DESYREL) 50 MG tablet Take 0.5-1 tablets (25-50 mg total) by mouth at bedtime. 90 tablet 3   vitamin C (ASCORBIC ACID) 500 MG tablet Take 500 mg by mouth daily.     No current facility-administered medications for this visit.    Allergies as of 12/13/2022 - Review Complete 08/14/2022  Allergen Reaction Noted   Avelox [moxifloxacin hcl in nacl] Hives, Shortness Of Breath, and Swelling 07/08/2011   Enbucrilate Hives and Rash 01/10/2022   Tape Dermatitis and Rash 11/15/2017    Family History  Adopted: Yes  Problem Relation Age of Onset   Blindness Mother    Glaucoma Mother    Hypertension Father     Social History   Socioeconomic History   Marital status: Married    Spouse name: Not on file   Number of children: 0   Years of education: Not on file   Highest education level: 12th grade  Occupational History   Occupation: Airline pilot  Employer: [employer]    Comment: Retired  Tobacco Use   Smoking status: Former    Packs/day: 0.50    Years: 40.00    Additional pack years: 0.00    Total pack years: 20.00    Types: Cigarettes    Quit date: 2017    Years since quitting: 7.4   Smokeless tobacco: Never  Vaping Use    Vaping Use: Never used  Substance and Sexual Activity   Alcohol use: Not Currently   Drug use: Yes    Types: Marijuana    Comment: smokes every day   Sexual activity: Not Currently  Other Topics Concern   Not on file  Social History Narrative   Walks daily for exercise.   Social Determinants of Health   Financial Resource Strain: Low Risk  (03/09/2021)   Overall Financial Resource Strain (CARDIA)    Difficulty of Paying Living Expenses: Not hard at all  Food Insecurity: No Food Insecurity (03/09/2021)   Hunger Vital Sign    Worried About Running Out of Food in the Last Year: Never true    Ran Out of Food in the Last Year: Never true  Transportation Needs: No Transportation Needs (03/09/2021)   PRAPARE - Administrator, Civil Service (Medical): No    Lack of Transportation (Non-Medical): No  Physical Activity: Sufficiently Active (03/09/2021)   Exercise Vital Sign    Days of Exercise per Week: 5 days    Minutes of Exercise per Session: 30 min  Stress: No Stress Concern Present (03/09/2021)   [employer] of Occupational Health - Occupational Stress Questionnaire    Feeling of Stress : Only a little  Social Connections: Moderately Isolated (03/09/2021)   Social Connection and Isolation Panel [NHANES]    Frequency of Communication with Friends and Family: Once a week    Frequency of Social Gatherings with Friends and Family: Once a week    Attends Religious Services: Never    Database administrator or Organizations: Yes    Attends Banker Meetings: 1 to 4 times per year    Marital Status: Married  Catering manager Violence: Not At Risk (03/09/2021)   Humiliation, Afraid, Rape, and Kick questionnaire    Fear of Current or Ex-Partner: No    Emotionally Abused: No    Physically Abused: No    Sexually Abused: No    Review of Systems:    Constitutional: No fever or chills Skin: No rash Cardiovascular: No chest pain Respiratory: No SOB   Gastrointestinal: See HPI and otherwise negative Genitourinary: No dysuria  Neurological: No headache, dizziness or syncope Musculoskeletal: No new muscle or joint pain Hematologic: No bleeding Psychiatric: No history of depression or anxiety   Physical Exam:  Vital signs: BP 90/70 (BP Location: Left Arm, Patient Position: Sitting, Cuff Size: Normal)   Pulse 88 Comment: irregular  Ht 5' 11.25" (1.81 m)   Wt 136 lb 2 oz (61.7 kg)   BMI 18.85 kg/m    Constitutional:   Pleasant thin appearing, cachectic Caucasian male appears to be in NAD, Well developed, alert and cooperative Head:  Normocephalic and atraumatic. Eyes:   PEERL, EOMI. No icterus. Conjunctiva pink. Ears:  Normal auditory acuity. Neck:  Supple Throat: Oral cavity and pharynx without inflammation, swelling or lesion.  Respiratory: Respirations even and unlabored. Lungs clear to auscultation bilaterally.   No wheezes, crackles, or rhonchi.  Cardiovascular: Normal S1, S2. No MRG. Regular rate and rhythm. No peripheral edema, cyanosis  or pallor.  Gastrointestinal:  Soft, nondistended, nontender. No rebound or guarding. Normal bowel sounds. No appreciable masses or hepatomegaly. Rectal:  Not performed.  Msk:  Symmetrical without gross deformities. Without edema, no deformity or joint abnormality.  Neurologic:  Alert and  oriented x4;  grossly normal neurologically.  Skin:   Dry and intact without significant lesions or rashes. Psychiatric:  Demonstrates good judgement and reason without abnormal affect or behaviors.  RELEVANT LABS AND IMAGING: CBC    Component Value Date/Time   WBC 7.5 07/13/2022 0700   RBC 5.06 07/13/2022 0700   HGB 15.1 07/13/2022 0700   HGB 12.4 (L) 08/26/2020 1458   HCT 45.3 07/13/2022 0700   HCT 36.8 (L) 08/26/2020 1458   PLT 223 07/13/2022 0700   PLT 205 08/26/2020 1458   MCV 89.5 07/13/2022 0700   MCV 91 08/26/2020 1458   MCH 29.8 07/13/2022 0700   MCHC 33.3 07/13/2022 0700   RDW 14.6  07/13/2022 0700   RDW 13.6 08/26/2020 1458   LYMPHSABS 2.1 07/13/2022 0700   LYMPHSABS 2.1 08/26/2020 1458   MONOABS 0.5 07/13/2022 0700   EOSABS 0.9 (H) 07/13/2022 0700   EOSABS 0.4 08/26/2020 1458   BASOSABS 0.0 07/13/2022 0700   BASOSABS 0.0 08/26/2020 1458    CMP     Component Value Date/Time   NA 137 07/13/2022 0700   NA 140 04/12/2021 1549   K 3.9 07/13/2022 0700   CL 104 07/13/2022 0700   CL 98 03/25/2018 0000   CO2 22 07/13/2022 0700   CO2 24 03/25/2018 0000   GLUCOSE 97 07/13/2022 0700   BUN 31 (H) 07/13/2022 0700   BUN 25 04/12/2021 1549   CREATININE 1.53 (H) 07/13/2022 0700   CREATININE 1.09 01/17/2016 1015   CALCIUM 10.2 07/13/2022 0700   CALCIUM 2.3 03/25/2018 0000   PROT 7.3 03/14/2022 1218   PROT 5.9 (L) 06/02/2020 1433   ALBUMIN 3.6 03/14/2022 1218   ALBUMIN 3.8 06/02/2020 1433   ALBUMIN 3.8 03/25/2018 0000   AST 40 03/14/2022 1218   ALT 22 03/14/2022 1218   ALKPHOS 157 (H) 03/14/2022 1218   BILITOT 0.5 03/14/2022 1218   BILITOT <0.2 06/02/2020 1433   GFRNONAA 50 (L) 07/13/2022 0700   GFRNONAA 66 03/25/2018 0000   GFRNONAA 48 (L) 01/08/2016 1443   GFRAA 65 06/10/2020 0830   GFRAA 55 (L) 01/08/2016 1443    Assessment: 1.  History of renal cancer: Spread to esophagus and liver as well as bone, has undergone chemotherapy and radiation over the past 10+ years, now developing dysphagia, status post G-tube insertion, but can hardly handle his own secretions 2.  Dysphagia 3.  Esophageal cancer  Plan: 1.  Discussed with patient that ideally we would get a barium esophagram.  He thinks he could maybe get down about a half a cup of barium.  But tells me this would hurt him a lot. 2.  We briefly discussed the option of an esophageal stent.  This would need to be done in collaboration with his oncology team to make sure it is a good option for him. 3.  At this point will let Dr. Meridee Score review notes from today and get in touch with his oncology team to see  if this is an option for him.  We will call and let him know.  He verbalized understanding. 4.  Patient does tell me his oncology team was wanting a repeat EGD regardless of stenting.  He would rather have this done here  then at [facility] as it is easier for him to get here.  Told him this could be done, but will wait on recommendations from Dr. Meridee Score.  Hyacinth Meeker, PA-C [facility] 12/13/2022, 1:30 PM  Cc: Pearline Cables, MD

## 2022-12-14 ENCOUNTER — Encounter (HOSPITAL_BASED_OUTPATIENT_CLINIC_OR_DEPARTMENT_OTHER): Payer: Self-pay

## 2022-12-14 ENCOUNTER — Other Ambulatory Visit: Payer: Self-pay

## 2022-12-14 ENCOUNTER — Emergency Department (HOSPITAL_BASED_OUTPATIENT_CLINIC_OR_DEPARTMENT_OTHER): Payer: Medicare HMO

## 2022-12-14 ENCOUNTER — Emergency Department (HOSPITAL_BASED_OUTPATIENT_CLINIC_OR_DEPARTMENT_OTHER)
Admission: EM | Admit: 2022-12-14 | Discharge: 2022-12-15 | Disposition: A | Discharge status: Home / Self Care | Payer: Medicare HMO | Attending: Emergency Medicine | Admitting: Emergency Medicine

## 2022-12-14 DIAGNOSIS — Z955 Presence of coronary angioplasty implant and graft: Secondary | ICD-10-CM | POA: Diagnosis not present

## 2022-12-14 DIAGNOSIS — C649 Malignant neoplasm of unspecified kidney, except renal pelvis: Secondary | ICD-10-CM | POA: Diagnosis not present

## 2022-12-14 DIAGNOSIS — R1310 Dysphagia, unspecified: Secondary | ICD-10-CM

## 2022-12-14 DIAGNOSIS — Z8501 Personal history of malignant neoplasm of esophagus: Secondary | ICD-10-CM | POA: Insufficient documentation

## 2022-12-14 DIAGNOSIS — Z87891 Personal history of nicotine dependence: Secondary | ICD-10-CM | POA: Diagnosis not present

## 2022-12-14 DIAGNOSIS — Z85528 Personal history of other malignant neoplasm of kidney: Secondary | ICD-10-CM | POA: Diagnosis not present

## 2022-12-14 DIAGNOSIS — J449 Chronic obstructive pulmonary disease, unspecified: Secondary | ICD-10-CM | POA: Diagnosis not present

## 2022-12-14 DIAGNOSIS — C159 Malignant neoplasm of esophagus, unspecified: Secondary | ICD-10-CM | POA: Diagnosis not present

## 2022-12-14 DIAGNOSIS — I2510 Atherosclerotic heart disease of native coronary artery without angina pectoris: Secondary | ICD-10-CM | POA: Diagnosis not present

## 2022-12-14 DIAGNOSIS — E860 Dehydration: Secondary | ICD-10-CM | POA: Diagnosis not present

## 2022-12-14 DIAGNOSIS — I10 Essential (primary) hypertension: Secondary | ICD-10-CM | POA: Diagnosis not present

## 2022-12-14 DIAGNOSIS — M7989 Other specified soft tissue disorders: Secondary | ICD-10-CM | POA: Insufficient documentation

## 2022-12-14 DIAGNOSIS — E871 Hypo-osmolality and hyponatremia: Secondary | ICD-10-CM | POA: Diagnosis not present

## 2022-12-14 DIAGNOSIS — R0602 Shortness of breath: Secondary | ICD-10-CM | POA: Diagnosis not present

## 2022-12-14 DIAGNOSIS — C155 Malignant neoplasm of lower third of esophagus: Secondary | ICD-10-CM

## 2022-12-14 DIAGNOSIS — R2240 Localized swelling, mass and lump, unspecified lower limb: Secondary | ICD-10-CM | POA: Diagnosis not present

## 2022-12-14 DIAGNOSIS — E43 Unspecified severe protein-calorie malnutrition: Secondary | ICD-10-CM | POA: Diagnosis not present

## 2022-12-14 DIAGNOSIS — R600 Localized edema: Secondary | ICD-10-CM | POA: Diagnosis not present

## 2022-12-14 LAB — CBC WITH DIFFERENTIAL/PLATELET
Abs Immature Granulocytes: 0.03 10*3/uL (ref 0.00–0.07)
Basophils Absolute: 0 10*3/uL (ref 0.0–0.1)
Basophils Relative: 0 %
Eosinophils Absolute: 0.1 10*3/uL (ref 0.0–0.5)
Eosinophils Relative: 1 %
HCT: 44.2 % (ref 39.0–52.0)
Hemoglobin: 14.9 g/dL (ref 13.0–17.0)
Immature Granulocytes: 0 %
Lymphocytes Relative: 10 %
Lymphs Abs: 0.8 10*3/uL (ref 0.7–4.0)
MCH: 31 pg (ref 26.0–34.0)
MCHC: 33.7 g/dL (ref 30.0–36.0)
MCV: 91.9 fL (ref 80.0–100.0)
Monocytes Absolute: 1 10*3/uL (ref 0.1–1.0)
Monocytes Relative: 12 %
Neutro Abs: 6.2 10*3/uL (ref 1.7–7.7)
Neutrophils Relative %: 77 %
Platelets: 277 10*3/uL (ref 150–400)
RBC: 4.81 MIL/uL (ref 4.22–5.81)
RDW: 15.6 % — ABNORMAL HIGH (ref 11.5–15.5)
WBC: 8.1 10*3/uL (ref 4.0–10.5)
nRBC: 0 % (ref 0.0–0.2)

## 2022-12-14 LAB — COMPREHENSIVE METABOLIC PANEL
ALT: 23 U/L (ref 0–44)
AST: 52 U/L — ABNORMAL HIGH (ref 15–41)
Albumin: 3.4 g/dL — ABNORMAL LOW (ref 3.5–5.0)
Alkaline Phosphatase: 298 U/L — ABNORMAL HIGH (ref 38–126)
Anion gap: 9 (ref 5–15)
BUN: 45 mg/dL — ABNORMAL HIGH (ref 8–23)
CO2: 23 mmol/L (ref 22–32)
Calcium: 9.9 mg/dL (ref 8.9–10.3)
Chloride: 99 mmol/L (ref 98–111)
Creatinine, Ser: 0.99 mg/dL (ref 0.61–1.24)
GFR, Estimated: >60 mL/min (ref >60)
Glucose, Bld: 69 mg/dL — ABNORMAL LOW (ref 70–99)
Potassium: 5.4 mmol/L — ABNORMAL HIGH (ref 3.5–5.1)
Sodium: 131 mmol/L — ABNORMAL LOW (ref 135–145)
Total Bilirubin: 0.6 mg/dL (ref 0.3–1.2)
Total Protein: 6.8 g/dL (ref 6.5–8.1)

## 2022-12-14 LAB — BRAIN NATRIURETIC PEPTIDE: B Natriuretic Peptide: 654.9 pg/mL — ABNORMAL HIGH (ref 0.0–100.0)

## 2022-12-14 MED ORDER — IOHEXOL 300 MG/ML  SOLN
100.0000 mL | Freq: Once | INTRAMUSCULAR | Status: AC | PRN
  Administered 2022-12-15: 80 mL via INTRAVENOUS

## 2022-12-14 NOTE — ED Provider Notes (Signed)
[facility] EMERGENCY [facility] Emergency Department Provider Note MRN:  [id number]  Arrival date & time: 12/15/22     Chief Complaint   Leg Swelling   History of Present Illness   Douglas [name] is a 67 y.o. year-old male with a history of stage IV kidney cancer presenting to the ED with chief complaint of leg swelling.  New leg swelling over the past 2 weeks.  Some mild leg discomfort.  Denies chest pain or abdominal pain, mild shortness of breath on occasion.  No fever, no cough.  Review of Systems  A thorough review of systems was obtained and all systems are negative except as noted in the HPI and PMH.   Patient's Health History    Past Medical History:  Diagnosis Date   Allergic rhinitis    Anxiety    Arthritis    "all over my body" (10/16/2013)   Cataract    Chronic back pain    "neck to lower back" (10/16/2013)   Colon polyps    Compression fracture    S/P L-SPINE; pt does not recall this hx on 10/16/2013   COPD (chronic obstructive pulmonary disease) (HCC)    FVC .66   Coronary artery disease    Depression    "situational since 09/22/2013 OR"   Encounter for long-term (current) use of other medications    Esophageal cancer (HCC)    GERD (gastroesophageal reflux disease)    Hemoptysis    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)    History of blood transfusion 09/2013   "think so; not sure; related to big OR"   History of surgical fusion joint    DDD C-SPINE   Hx of blood clots    Hyperlipidemia    Hypertension    Hypertensive retinopathy    Leiomyoma OF SKIN   INCREASED RISK RENAL CELL CA  NEEDS CT OF KIDNEYS EVERY 2 YEARS NEXT DUE  02/06/13   Melanoma in situ (HCC)    Melanoma of back (HCC)    Neuromuscular disorder (HCC)    Osteoarthritis    Renal calculi    Renal cell carcinoma (HCC)    type 2; papillary (metastized to bone)   Tobacco use disorder    Unspecified vitamin D deficiency     Past Surgical History:  Procedure  Laterality Date   ANTERIOR CERVICAL DECOMP/DISCECTOMY FUSION  1992   ANTERIOR CRUCIATE LIGAMENT REPAIR Bilateral [phone]   APPENDECTOMY  09/22/2013   CHOLECYSTECTOMY  09/22/2013   "had tumors in it"   CORONARY STENT INTERVENTION N/A 05/24/2020   Procedure: CORONARY STENT INTERVENTION;  Surgeon: Swaziland, Peter M, MD;  Location: [facility] INVASIVE CV LAB;  Service: Cardiovascular;  Laterality: N/A;   HAND LIGAMENT RECONSTRUCTION Right    HARVEST BONE GRAFT Right 1992   "hip; for neck fusion"   INGUINAL HERNIA REPAIR Right 2004   INGUINAL HERNIA REPAIR Left 12/28/2021   Procedure: OPEN LEFT INGUINAL HERNIA REPAIR;  Surgeon: Abigail Miyamoto, MD;  Location: [facility] OR;  Service: General;  Laterality: Left;   INSERTION OF MESH Left 12/28/2021   Procedure: INSERTION OF MESH;  Surgeon: Abigail Miyamoto, MD;  Location: [facility] OR;  Service: General;  Laterality: Left;   IR GASTROSTOMY TUBE MOD SED  07/13/2022   IR PATIENT EVAL TECH 0-60 MINS  07/14/2022   IR REPLC GASTRO/COLONIC TUBE PERCUT W/FLUORO  12/12/2022   LEFT HEART CATH AND CORONARY ANGIOGRAPHY N/A 05/24/2020   Procedure: LEFT HEART CATH AND CORONARY ANGIOGRAPHY;  Surgeon:  Swaziland, Peter M, MD;  Location: [facility] INVASIVE CV LAB;  Service: Cardiovascular;  Laterality: N/A;   LIGAMENT REPAIR Right ~ 2001   "little finger"   LYMPH NODE DISSECTION Right 09/22/2013   "in the area of the kidney; they were clean"   MELANOMA EXCISION  ~ 2007   "off my back"   PARTIAL NEPHRECTOMY Right 09/22/2013   removal of fascia overlying the right psoas Right 09/22/2013   RENAL ARTERY STENT Right 09/22/2013   RIGHT COLECTOMY Right 09/22/2013   ROBOTIC ASSITED PARTIAL NEPHRECTOMY  05/22/2012   Procedure: ROBOTIC ASSITED PARTIAL NEPHRECTOMY;  Surgeon: Sebastian Ache, MD;  Location: [facility] ORS;  Service: Urology;  Laterality: Right;  Right Robotic Cyst Decortication and Partial Nephrectomy     Family History  Adopted: Yes  Problem Relation Age of Onset    Blindness Mother    Glaucoma Mother    Hypertension Father     Social History   Socioeconomic History   Marital status: Married    Spouse name: Not on file   Number of children: 0   Years of education: Not on file   Highest education level: 12th grade  Occupational History   Occupation: Investment banker, corporate: [employer]    Comment: Retired  Tobacco Use   Smoking status: Former    Packs/day: 0.50    Years: 40.00    Additional pack years: 0.00    Total pack years: 20.00    Types: Cigarettes    Quit date: 2017    Years since quitting: 7.4   Smokeless tobacco: Never  Vaping Use   Vaping Use: Never used  Substance and Sexual Activity   Alcohol use: Not Currently   Drug use: Yes    Types: Marijuana    Comment: smokes every day   Sexual activity: Not Currently  Other Topics Concern   Not on file  Social History Narrative   Walks daily for exercise.   Social Determinants of Health   Financial Resource Strain: Low Risk  (03/09/2021)   Overall Financial Resource Strain (CARDIA)    Difficulty of Paying Living Expenses: Not hard at all  Food Insecurity: No Food Insecurity (03/09/2021)   Hunger Vital Sign    Worried About Running Out of Food in the Last Year: Never true    Ran Out of Food in the Last Year: Never true  Transportation Needs: No Transportation Needs (03/09/2021)   PRAPARE - Administrator, Civil Service (Medical): No    Lack of Transportation (Non-Medical): No  Physical Activity: Sufficiently Active (03/09/2021)   Exercise Vital Sign    Days of Exercise per Week: 5 days    Minutes of Exercise per Session: 30 min  Stress: No Stress Concern Present (03/09/2021)   [employer] of Occupational Health - Occupational Stress Questionnaire    Feeling of Stress : Only a little  Social Connections: Moderately Isolated (03/09/2021)   Social Connection and Isolation Panel [NHANES]    Frequency of Communication with Friends and Family: Once a week     Frequency of Social Gatherings with Friends and Family: Once a week    Attends Religious Services: Never    Database administrator or Organizations: Yes    Attends Banker Meetings: 1 to 4 times per year    Marital Status: Married  Catering manager Violence: Not At Risk (03/09/2021)   Humiliation, Afraid, Rape, and Kick questionnaire    Fear of Current or Ex-Partner: No  Emotionally Abused: No    Physically Abused: No    Sexually Abused: No     Physical Exam   Vitals:   12/14/22 1911 12/14/22 2315  BP: (!) 110/91 116/84  Pulse: 95 90  Resp: 18 16  Temp: 98.9 F (37.2 C) 98 F (36.7 C)  SpO2: 99% 97%    CONSTITUTIONAL: Chronically ill-appearing, NAD NEURO/PSYCH:  Alert and oriented x 3, no focal deficits EYES:  eyes equal and reactive ENT/NECK:  no LAD, no JVD CARDIO: Regular rate, well-perfused, normal S1 and S2 PULM:  CTAB no wheezing or rhonchi GI/GU:  non-distended, non-tender MSK/SPINE:  No gross deformities, pitting edema to bilateral lower extremities SKIN:  no rash, atraumatic   *Additional and/or pertinent findings included in MDM below  Diagnostic and Interventional Summary    EKG Interpretation Date/Time:    Ventricular Rate:    PR Interval:    QRS Duration:    QT Interval:    QTC Calculation:   R Axis:      Text Interpretation:         Labs Reviewed  BRAIN NATRIURETIC PEPTIDE - Abnormal; Notable for the following components:      Result Value   B Natriuretic Peptide 654.9 (*)    All other components within normal limits  CBC WITH DIFFERENTIAL/PLATELET - Abnormal; Notable for the following components:   RDW 15.6 (*)    All other components within normal limits  COMPREHENSIVE METABOLIC PANEL - Abnormal; Notable for the following components:   Sodium 131 (*)    Potassium 5.4 (*)    Glucose, Bld 69 (*)    BUN 45 (*)    Albumin 3.4 (*)    AST 52 (*)    Alkaline Phosphatase 298 (*)    All other components within normal limits     US Venous Img Lower Bilateral  Final Result    CT ABDOMEN PELVIS W CONTRAST  Final Result    DG Chest Port 1 View  Final Result      Medications  iohexol (OMNIPAQUE) 300 MG/ML solution 100 mL (80 mLs Intravenous Contrast Given 12/15/22 0001)     Procedures  /  Critical Care Procedures  ED Course and Medical Decision Making  Initial Impression and Ddx Differential diagnosis includes liver impairment, renal impairment, CHF, DVT.  No signs of infection on my exam, no erythema or increased warmth.  Also considering tumor burden causing lymphatic drainage impairment within the abdomen given patient's history.  Past medical/surgical history that increases complexity of ED encounter: Stage IV cancer  Interpretation of Diagnostics I personally reviewed the Chest Xray and my interpretation is as follows: No pneumothorax or frank edema  Labs overall reassuring, minimal elevation in BNP, mild hyponatremia, mild decreased albumin  Patient Reassessment and Ultimate Disposition/Management     CT abdomen and DVT ultrasound are both without emergent process.  Does have increased tumor burden but no issue with the vasculature.  Suspect malnutrition is at least contributing to patient's swelling.  No emergent process, appropriate for discharge.  Patient management required discussion with the following services or consulting groups:  None  Complexity of Problems Addressed Acute illness or injury that poses threat of life of bodily function  Additional Data Reviewed and Analyzed Further history obtained from: Further history from spouse/family member  Additional Factors Impacting ED Encounter Risk Consideration of hospitalization  Elmer Sow. Pilar Plate, MD [facility] Emergency Medicine [facility] [email or web address]  Final Clinical Impressions(s) / ED Diagnoses  ICD-10-CM   1. Leg swelling  M79.89       ED Discharge Orders     None        Discharge  Instructions Discussed with and Provided to Patient:     Discharge Instructions      You were evaluated in the Emergency Department and after careful evaluation, we did not find any emergent condition requiring admission or further testing in the hospital.  Your exam/testing today was overall reassuring.  Your swelling may be due to a nutritional issue.  I recommend follow-up with your regular doctors to discuss her symptoms.  Please return to the Emergency Department if you experience any worsening of your condition.  Thank you for allowing [place] to be a part of your care.        Sabas Sous, MD 12/15/22 [phone]

## 2022-12-14 NOTE — ED Triage Notes (Signed)
POV from home, A&O x 4, GCS 15, amb to triage  [facility], sodium low, "vitamins low" per pt at the UC.  Couple weeks of bilateral leg swelling, stage 4 kidney cancer, esophagus, stomach.

## 2022-12-14 NOTE — Telephone Encounter (Signed)
Called [facility] [phone] Hematology Oncology, Dr. Erick Colace office. Dr. Gypsy Lore is out of the office for the rest of the week and will return on 12/18/22. Denny Peon, CMA is out of the office until tomorrow. I spoke with the office manager and she informed me that she did not have Dr. Erick Colace direct contact information and a message would have to be sent to their Director. Since Dr. Gypsy Lore returns to the office on Monday, they asked that you give them a call then to speak with him. No doc line available. This is the office number Phone: [phone]

## 2022-12-14 NOTE — Progress Notes (Signed)
Difficult situation. Chart reviewed. Dr. Meridee Score is aware of this patient and had the following recommendations.  Agree with barium swallow if the patient can tolerate it. Oncology needs to clarify if they have any immediate plans for further radiation and if they do, are they okay with having stenting done. Patient needs to be aware that stenting can cause significant reflux that sometimes can lead to stent removal. There are risks for perforation with stent placement. Dr. Meridee Score does not have any openings until August to consider this. If he wants something sooner he will need to be evaluated by [facility] where he has been seen in the past.  If these things can be done and he wants to consider stent placement in August with Dr. Meridee Score I will let him know and we can get him scheduled. If he does not want to wait that long, or does not want to have the stent placed given risks of it, he can continue with his tube feeds - PEG is in place.  [place] looks like Victorino Dike is out of the office today. Can you let the patient know? Thanks

## 2022-12-14 NOTE — Progress Notes (Signed)
MyChart message sent to patient with Dr. Lanetta Inch recommendations. Patient reviewed and responded to MyChart message requesting that recommendations be discussed with his Oncologist, Dr. Gypsy Lore. Request has been sent to Dr. Adela Lank.

## 2022-12-15 ENCOUNTER — Emergency Department (HOSPITAL_BASED_OUTPATIENT_CLINIC_OR_DEPARTMENT_OTHER): Payer: Medicare HMO

## 2022-12-15 ENCOUNTER — Other Ambulatory Visit: Payer: Self-pay | Admitting: Family Medicine

## 2022-12-15 ENCOUNTER — Encounter: Payer: Self-pay | Admitting: Family Medicine

## 2022-12-15 DIAGNOSIS — C649 Malignant neoplasm of unspecified kidney, except renal pelvis: Secondary | ICD-10-CM | POA: Diagnosis not present

## 2022-12-15 DIAGNOSIS — I7143 Infrarenal abdominal aortic aneurysm, without rupture: Secondary | ICD-10-CM | POA: Diagnosis not present

## 2022-12-15 DIAGNOSIS — C159 Malignant neoplasm of esophagus, unspecified: Secondary | ICD-10-CM | POA: Diagnosis not present

## 2022-12-15 DIAGNOSIS — E860 Dehydration: Secondary | ICD-10-CM | POA: Diagnosis not present

## 2022-12-15 DIAGNOSIS — C787 Secondary malignant neoplasm of liver and intrahepatic bile duct: Secondary | ICD-10-CM | POA: Diagnosis not present

## 2022-12-15 DIAGNOSIS — R1310 Dysphagia, unspecified: Secondary | ICD-10-CM | POA: Diagnosis not present

## 2022-12-15 DIAGNOSIS — R600 Localized edema: Secondary | ICD-10-CM | POA: Diagnosis not present

## 2022-12-15 NOTE — Discharge Instructions (Signed)
You were evaluated in the Emergency Department and after careful evaluation, we did not find any emergent condition requiring admission or further testing in the hospital.  Your exam/testing today was overall reassuring.  Your swelling may be due to a nutritional issue.  I recommend follow-up with your regular doctors to discuss her symptoms.  Please return to the Emergency Department if you experience any worsening of your condition.  Thank you for allowing [place] to be a part of your care.

## 2022-12-16 DIAGNOSIS — R1310 Dysphagia, unspecified: Secondary | ICD-10-CM | POA: Diagnosis not present

## 2022-12-16 DIAGNOSIS — E860 Dehydration: Secondary | ICD-10-CM | POA: Diagnosis not present

## 2022-12-16 DIAGNOSIS — C649 Malignant neoplasm of unspecified kidney, except renal pelvis: Secondary | ICD-10-CM | POA: Diagnosis not present

## 2022-12-16 DIAGNOSIS — C159 Malignant neoplasm of esophagus, unspecified: Secondary | ICD-10-CM | POA: Diagnosis not present

## 2022-12-17 DIAGNOSIS — C649 Malignant neoplasm of unspecified kidney, except renal pelvis: Secondary | ICD-10-CM | POA: Diagnosis not present

## 2022-12-17 DIAGNOSIS — R1310 Dysphagia, unspecified: Secondary | ICD-10-CM | POA: Diagnosis not present

## 2022-12-17 DIAGNOSIS — E860 Dehydration: Secondary | ICD-10-CM | POA: Diagnosis not present

## 2022-12-17 DIAGNOSIS — C159 Malignant neoplasm of esophagus, unspecified: Secondary | ICD-10-CM | POA: Diagnosis not present

## 2022-12-18 DIAGNOSIS — C159 Malignant neoplasm of esophagus, unspecified: Secondary | ICD-10-CM | POA: Diagnosis not present

## 2022-12-18 DIAGNOSIS — R1310 Dysphagia, unspecified: Secondary | ICD-10-CM | POA: Diagnosis not present

## 2022-12-18 DIAGNOSIS — E860 Dehydration: Secondary | ICD-10-CM | POA: Diagnosis not present

## 2022-12-18 DIAGNOSIS — M7989 Other specified soft tissue disorders: Secondary | ICD-10-CM | POA: Diagnosis not present

## 2022-12-18 DIAGNOSIS — C649 Malignant neoplasm of unspecified kidney, except renal pelvis: Secondary | ICD-10-CM | POA: Diagnosis not present

## 2022-12-18 DIAGNOSIS — E43 Unspecified severe protein-calorie malnutrition: Secondary | ICD-10-CM | POA: Diagnosis not present

## 2022-12-19 DIAGNOSIS — C159 Malignant neoplasm of esophagus, unspecified: Secondary | ICD-10-CM | POA: Diagnosis not present

## 2022-12-19 DIAGNOSIS — R1310 Dysphagia, unspecified: Secondary | ICD-10-CM | POA: Diagnosis not present

## 2022-12-19 DIAGNOSIS — E860 Dehydration: Secondary | ICD-10-CM | POA: Diagnosis not present

## 2022-12-19 DIAGNOSIS — C649 Malignant neoplasm of unspecified kidney, except renal pelvis: Secondary | ICD-10-CM | POA: Diagnosis not present

## 2022-12-20 ENCOUNTER — Telehealth: Payer: Self-pay | Admitting: *Deleted

## 2022-12-20 DIAGNOSIS — C159 Malignant neoplasm of esophagus, unspecified: Secondary | ICD-10-CM | POA: Diagnosis not present

## 2022-12-20 DIAGNOSIS — C649 Malignant neoplasm of unspecified kidney, except renal pelvis: Secondary | ICD-10-CM | POA: Diagnosis not present

## 2022-12-20 DIAGNOSIS — R1310 Dysphagia, unspecified: Secondary | ICD-10-CM | POA: Diagnosis not present

## 2022-12-20 DIAGNOSIS — E860 Dehydration: Secondary | ICD-10-CM | POA: Diagnosis not present

## 2022-12-20 NOTE — Progress Notes (Signed)
  Care Coordination Note  12/20/2022 Name: Brysyn Howren MRN: [id number] DOB: May 29, 1956  Ramandeep Fiqueroa is a 67 y.o. year old male who is a primary care patient of Copland, Gwenlyn Found, MD and is actively engaged with the care management team. I reached out to [name] by phone today to assist with re-scheduling a follow up visit with the RN Case Manager  Follow up plan: Unsuccessful telephone outreach attempt made. A HIPAA compliant phone message was left for the patient providing contact information and requesting a return call.   Burman Nieves, CCMA Care Coordination Care Guide Direct Dial: [phone]

## 2022-12-21 DIAGNOSIS — C649 Malignant neoplasm of unspecified kidney, except renal pelvis: Secondary | ICD-10-CM | POA: Diagnosis not present

## 2022-12-21 DIAGNOSIS — R1310 Dysphagia, unspecified: Secondary | ICD-10-CM | POA: Diagnosis not present

## 2022-12-21 DIAGNOSIS — C159 Malignant neoplasm of esophagus, unspecified: Secondary | ICD-10-CM | POA: Diagnosis not present

## 2022-12-21 DIAGNOSIS — E860 Dehydration: Secondary | ICD-10-CM | POA: Diagnosis not present

## 2022-12-21 NOTE — Telephone Encounter (Signed)
It would be ideal to hold a Avastin if possible. Based on my updated schedule my next available now it is not until August 28 or September 19 (unless someone else cancels). We can certainly hold a slot and see the barium swallow if he can tolerate it. Otherwise may need to have procedure [facility] based on my availability. Brett Canales, just let Patty and I know what you want to do and forward me the results of the barium swallow. Thanks. GM

## 2022-12-22 DIAGNOSIS — C159 Malignant neoplasm of esophagus, unspecified: Secondary | ICD-10-CM | POA: Diagnosis not present

## 2022-12-22 DIAGNOSIS — R1310 Dysphagia, unspecified: Secondary | ICD-10-CM | POA: Diagnosis not present

## 2022-12-22 DIAGNOSIS — E860 Dehydration: Secondary | ICD-10-CM | POA: Diagnosis not present

## 2022-12-22 DIAGNOSIS — C649 Malignant neoplasm of unspecified kidney, except renal pelvis: Secondary | ICD-10-CM | POA: Diagnosis not present

## 2022-12-23 DIAGNOSIS — E860 Dehydration: Secondary | ICD-10-CM | POA: Diagnosis not present

## 2022-12-23 DIAGNOSIS — C649 Malignant neoplasm of unspecified kidney, except renal pelvis: Secondary | ICD-10-CM | POA: Diagnosis not present

## 2022-12-23 DIAGNOSIS — R1310 Dysphagia, unspecified: Secondary | ICD-10-CM | POA: Diagnosis not present

## 2022-12-23 DIAGNOSIS — C159 Malignant neoplasm of esophagus, unspecified: Secondary | ICD-10-CM | POA: Diagnosis not present

## 2022-12-24 DIAGNOSIS — E860 Dehydration: Secondary | ICD-10-CM | POA: Diagnosis not present

## 2022-12-24 DIAGNOSIS — C159 Malignant neoplasm of esophagus, unspecified: Secondary | ICD-10-CM | POA: Diagnosis not present

## 2022-12-24 DIAGNOSIS — C649 Malignant neoplasm of unspecified kidney, except renal pelvis: Secondary | ICD-10-CM | POA: Diagnosis not present

## 2022-12-24 DIAGNOSIS — R1310 Dysphagia, unspecified: Secondary | ICD-10-CM | POA: Diagnosis not present

## 2022-12-25 ENCOUNTER — Ambulatory Visit (HOSPITAL_COMMUNITY)
Admission: RE | Admit: 2022-12-25 | Discharge: 2022-12-25 | Disposition: A | Discharge status: Home / Self Care | Payer: Medicare HMO | Source: Ambulatory Visit | Attending: Interventional Radiology | Admitting: Interventional Radiology

## 2022-12-25 ENCOUNTER — Other Ambulatory Visit (HOSPITAL_COMMUNITY): Payer: Self-pay | Admitting: Interventional Radiology

## 2022-12-25 ENCOUNTER — Other Ambulatory Visit: Payer: Self-pay | Admitting: Radiology

## 2022-12-25 DIAGNOSIS — R633 Feeding difficulties, unspecified: Secondary | ICD-10-CM

## 2022-12-25 DIAGNOSIS — E860 Dehydration: Secondary | ICD-10-CM | POA: Diagnosis not present

## 2022-12-25 DIAGNOSIS — C159 Malignant neoplasm of esophagus, unspecified: Secondary | ICD-10-CM | POA: Diagnosis not present

## 2022-12-25 DIAGNOSIS — R1310 Dysphagia, unspecified: Secondary | ICD-10-CM | POA: Diagnosis not present

## 2022-12-25 DIAGNOSIS — C649 Malignant neoplasm of unspecified kidney, except renal pelvis: Secondary | ICD-10-CM | POA: Diagnosis not present

## 2022-12-25 HISTORY — PX: IR PATIENT EVAL TECH 0-60 MINS: IMG5564

## 2022-12-25 MED ORDER — AMOXICILLIN 875 MG PO TABS: 875.0000 mg | ORAL_TABLET | Freq: Two times a day (BID) | ORAL | 0 refills | Status: DC

## 2022-12-25 NOTE — Progress Notes (Signed)
Patient seen in IR holding room  with his wife due to gastrostomy tube malfunction. Patient has a history of esophageal cancer with gastrostomy tube originally placed on 1.25.24 Last exchanged on 6.25.24 16 Fr. Per Patient  and his wife the existing gastrostomy tube is clogged and they are unable to flush or instill feeds via gravity.  Gastrostomy tube was easily flushed using a 25 ml syringe. Education provided to the patient and his wife regarding care. Patient was then able to demonstrate flushing appropriately without issue.  Due to excoriation and noted  beefy tissues noted to be around the exit site it was recommend  the Patient use anti fungal and barrier cream.  The Patient was also called in an antibiotic for prophylactic purposes. Patient and his wife verbalized understanding.   Ok to begin using g-tube for medicines, tube feeds, free water, etc.  Please call IR for any questions or concerns regarding the g-tube.

## 2022-12-25 NOTE — Telephone Encounter (Signed)
Called and spoke with pt this morning. Pt states that he will attempt to proceed with the barium swallow study. Pt states that he would like to schedule this a few weeks out since he has so many upcoming appts. Pt is aware of Dr. Elesa Hacker availability. Pt reports having to go to IR today for evaluation of his feeding tube. It may be clogged, she is not able to get anything to go down the tube. Pt will contact IR today. Otherwise, pt knows to expect a call from radiology scheduling to set up barium swallow study. Pt verbalized understanding and had no concerns at the end of the call.   Barium swallow study order in epic. Secure staff message sent to radiology scheduling to contact patient to set up appt.

## 2022-12-26 ENCOUNTER — Encounter (HOSPITAL_COMMUNITY): Payer: Self-pay | Admitting: Radiology

## 2022-12-26 ENCOUNTER — Other Ambulatory Visit: Payer: Medicare HMO

## 2022-12-26 ENCOUNTER — Other Ambulatory Visit (HOSPITAL_COMMUNITY): Payer: Self-pay | Admitting: Interventional Radiology

## 2022-12-26 DIAGNOSIS — C159 Malignant neoplasm of esophagus, unspecified: Secondary | ICD-10-CM | POA: Diagnosis not present

## 2022-12-26 DIAGNOSIS — K7689 Other specified diseases of liver: Secondary | ICD-10-CM | POA: Diagnosis not present

## 2022-12-26 DIAGNOSIS — K769 Liver disease, unspecified: Secondary | ICD-10-CM | POA: Diagnosis not present

## 2022-12-26 DIAGNOSIS — Z1509 Genetic susceptibility to other malignant neoplasm: Secondary | ICD-10-CM | POA: Diagnosis not present

## 2022-12-26 DIAGNOSIS — C160 Malignant neoplasm of cardia: Secondary | ICD-10-CM | POA: Diagnosis not present

## 2022-12-26 DIAGNOSIS — C649 Malignant neoplasm of unspecified kidney, except renal pelvis: Secondary | ICD-10-CM | POA: Diagnosis not present

## 2022-12-26 DIAGNOSIS — R633 Feeding difficulties, unspecified: Secondary | ICD-10-CM

## 2022-12-26 DIAGNOSIS — R1310 Dysphagia, unspecified: Secondary | ICD-10-CM | POA: Diagnosis not present

## 2022-12-26 DIAGNOSIS — E860 Dehydration: Secondary | ICD-10-CM | POA: Diagnosis not present

## 2022-12-26 DIAGNOSIS — Z8553 Personal history of malignant neoplasm of renal pelvis: Secondary | ICD-10-CM | POA: Diagnosis not present

## 2022-12-26 DIAGNOSIS — Z87891 Personal history of nicotine dependence: Secondary | ICD-10-CM | POA: Diagnosis not present

## 2022-12-26 DIAGNOSIS — C787 Secondary malignant neoplasm of liver and intrahepatic bile duct: Secondary | ICD-10-CM | POA: Diagnosis not present

## 2022-12-26 NOTE — Procedures (Signed)
Patient seen in IR holding room  with his wife due to gastrostomy tube malfunction. Patient has a history of esophageal cancer with gastrostomy tube originally placed on 1.25.24 Last exchanged on 6.25.24 16 Fr. Per Patient  and his wife the existing gastrostomy tube is clogged and they are unable to flush or instill feeds via gravity.  Gastrostomy tube was easily flushed using a 25 ml syringe. Education provided to the patient and his wife regarding care. Patient was then able to demonstrate flushing appropriately without issue.  Due to excoriation and noted  beefy tissues noted to be around the exit site it was recommend  the Patient use anti fungal and barrier cream.  The Patient was also called in an antibiotic for prophylactic purposes. Patient and his wife verbalized understanding.    Ok to begin using g-tube for medicines, tube feeds, free water, etc.  Please call IR for any questions or concerns regarding the g-tube.   Patient was seen by Anders Grant NP

## 2023-01-02 ENCOUNTER — Telehealth: Payer: Self-pay | Admitting: Family Medicine

## 2023-01-02 ENCOUNTER — Telehealth: Payer: Self-pay

## 2023-01-02 ENCOUNTER — Other Ambulatory Visit: Payer: Self-pay | Admitting: Family Medicine

## 2023-01-02 ENCOUNTER — Other Ambulatory Visit: Payer: Self-pay

## 2023-01-02 ENCOUNTER — Encounter: Payer: Self-pay | Admitting: Family Medicine

## 2023-01-02 DIAGNOSIS — G893 Neoplasm related pain (acute) (chronic): Secondary | ICD-10-CM

## 2023-01-02 DIAGNOSIS — S81809A Unspecified open wound, unspecified lower leg, initial encounter: Secondary | ICD-10-CM

## 2023-01-02 DIAGNOSIS — C641 Malignant neoplasm of right kidney, except renal pelvis: Secondary | ICD-10-CM

## 2023-01-02 DIAGNOSIS — R600 Localized edema: Secondary | ICD-10-CM

## 2023-01-02 NOTE — Telephone Encounter (Signed)
Dee from [facility] called to seek recommendation on pt's legs swelling. Stated they are red and blistering. Nurse made aware and stated we will ask DOD for advice.

## 2023-01-02 NOTE — Telephone Encounter (Signed)
Nurse with [facility] call stating that the pt's legs are swelling and blistering and they want advice asap bc she leaves at 10. CB number is

## 2023-01-02 NOTE — Telephone Encounter (Signed)
Called pt to scheduled VV- had to leave a vm.

## 2023-01-02 NOTE — Telephone Encounter (Signed)
My chart message sent as well

## 2023-01-02 NOTE — Telephone Encounter (Signed)
Douglas [name] says the pt says the edema is worse than when he was at the ER. He is having some burning in the legs. He has also had a Liver bx since then and cancer meds are on hold. Puddle of fluid from left leg.   Douglas [name] works with Palliative care. Verbal order given to dress the leg blisters with Telfa and cling gauze per Dr Laury Axon. Pt will need HH order for wound care. Blisters do not appear infected. He has appointment set for this Thursday with Oncology.   Douglas [name] says she will be having oncology order for pole for bolus drip.)  Vitals today:  HR: 102 BP: 90/58

## 2023-01-02 NOTE — Telephone Encounter (Signed)
Duplicate

## 2023-01-03 ENCOUNTER — Encounter: Payer: Self-pay | Admitting: Family Medicine

## 2023-01-03 ENCOUNTER — Telehealth: Payer: Medicare HMO | Admitting: Family Medicine

## 2023-01-03 VITALS — Ht 71.25 in | Wt 121.0 lb

## 2023-01-03 DIAGNOSIS — S81809A Unspecified open wound, unspecified lower leg, initial encounter: Secondary | ICD-10-CM

## 2023-01-03 DIAGNOSIS — R600 Localized edema: Secondary | ICD-10-CM

## 2023-01-03 MED ORDER — HYDROCODONE-ACETAMINOPHEN 7.5-325 MG PO TABS
2.0000 | ORAL_TABLET | ORAL | 0 refills | Status: DC
Start: 2023-01-03 — End: 2023-03-08

## 2023-01-03 NOTE — Progress Notes (Signed)
HH ordered for wound care for multiple wounds on bilateral lower legs. Insurance requires a visit note.

## 2023-01-03 NOTE — Progress Notes (Signed)
Virtual Video Visit via MyChart Note  I connected with  Douglas [name] on 01/03/23 at  3:20 PM EDT by the video enabled telemedicine application for MyChart, and verified that I am speaking with the correct person using two identifiers.   I introduced myself as a Publishing rights manager with the practice. We discussed the limitations of evaluation and management by telemedicine and the availability of in person appointments. The patient expressed understanding and agreed to proceed.  Participating parties in this visit include: The patient and the nurse practitioner listed.  The patient is: At home I am: In the office - [facility]  Subjective:    CC:  Chief Complaint  Patient presents with   Wound Check    HPI: Douglas [name] is a 67 y.o. year old male presenting today via MyChart today for leg wounds.   Discussed the use of AI scribe software for clinical note transcription with the patient, who gave verbal consent to proceed.  History of Present Illness   The patient, with a history of stage four kidney cancer, has recently developed esophageal cancer. He has been on a feeding tube for an extended period, leading to significant weight loss and probable malnourishment per patient. Recently, he has experienced severe leg swelling, with his legs leaking substantial amounts of clear fluid. He reports his oncologist recently started him on spironolactone to aid with this, but he has not seen much improvement. He follows up with oncology tomorrow.   A palliative care nurse has been visiting the patient at home and noted the weeping and blistering of his legs. No pus or fever has been reported, suggesting no current infection. The patient has been largely homebound due to his condition, with significant fatigue and difficulty driving due to the size of his feet from the swelling. He lives with his wife, who has her own health issues. The nurse notified our office  of the edema, weeping, and blisters yesterday and our doc of the day gave verbal orders for nonadherent dressing. Wound nurse referral for home health was placed, but they needed a more recent visit for documentation and insurance purposes, so patient was able to do virtual visit with me today - reports that he cannot currently drive due to feeling poorly and did not have transportation method for today.          Past medical history, Surgical history, Family history not pertinant except as noted below, Social history, Allergies, and medications have been entered into the medical record, reviewed, and corrections made.   Review of Systems:  All review of systems negative except what is listed in the HPI   Objective:    General:  Speaking clearly in complete sentences. Absent shortness of breath noted.   Alert and oriented x3.   Normal judgment.  Absent acute distress. Difficult to clearly see on video, but there is evidence of bilateral lower extremity edema with weeping.   Impression and Recommendations:    1. Bilateral leg edema  2. Wound of lower extremity, unspecified laterality, initial encounter Lower extremity edema with weeping: Likely secondary to malnutrition and malignancy. Recently started on Spironolactone by oncologist. No signs of infection on video exam. -Order for wound nurse to evaluate and manage lower extremity wounds. -Consider Unna boot application based on wound nurse assessment. -Continue Spironolactone as prescribed by oncologist and follow-up with them tomorrow as scheduled. -Recommend follow-up with Dr. Dallas Schimke in the next few weeks or sooner if needed.  Follow-up if symptoms worsen or fail to improve.    I discussed the assessment and treatment plan with the patient. The patient was provided an opportunity to ask questions and all were answered. The patient agreed with the plan and demonstrated an understanding of the instructions.   The  patient was advised to call back or seek an in-person evaluation if the symptoms worsen or if the condition fails to improve as anticipated.    Clayborne Dana, NP

## 2023-01-04 DIAGNOSIS — D630 Anemia in neoplastic disease: Secondary | ICD-10-CM | POA: Diagnosis not present

## 2023-01-04 DIAGNOSIS — C649 Malignant neoplasm of unspecified kidney, except renal pelvis: Secondary | ICD-10-CM | POA: Diagnosis not present

## 2023-01-04 DIAGNOSIS — M25569 Pain in unspecified knee: Secondary | ICD-10-CM | POA: Diagnosis not present

## 2023-01-04 DIAGNOSIS — M25549 Pain in joints of unspecified hand: Secondary | ICD-10-CM | POA: Diagnosis not present

## 2023-01-04 DIAGNOSIS — R634 Abnormal weight loss: Secondary | ICD-10-CM | POA: Diagnosis not present

## 2023-01-04 DIAGNOSIS — C787 Secondary malignant neoplasm of liver and intrahepatic bile duct: Secondary | ICD-10-CM | POA: Diagnosis not present

## 2023-01-04 DIAGNOSIS — C7931 Secondary malignant neoplasm of brain: Secondary | ICD-10-CM | POA: Diagnosis not present

## 2023-01-04 DIAGNOSIS — M25521 Pain in right elbow: Secondary | ICD-10-CM | POA: Diagnosis not present

## 2023-01-04 DIAGNOSIS — Z1509 Genetic susceptibility to other malignant neoplasm: Secondary | ICD-10-CM | POA: Diagnosis not present

## 2023-01-04 DIAGNOSIS — R1310 Dysphagia, unspecified: Secondary | ICD-10-CM | POA: Diagnosis not present

## 2023-01-05 DIAGNOSIS — E860 Dehydration: Secondary | ICD-10-CM | POA: Diagnosis not present

## 2023-01-05 DIAGNOSIS — C649 Malignant neoplasm of unspecified kidney, except renal pelvis: Secondary | ICD-10-CM | POA: Diagnosis not present

## 2023-01-05 DIAGNOSIS — C159 Malignant neoplasm of esophagus, unspecified: Secondary | ICD-10-CM | POA: Diagnosis not present

## 2023-01-05 DIAGNOSIS — R1310 Dysphagia, unspecified: Secondary | ICD-10-CM | POA: Diagnosis not present

## 2023-01-09 ENCOUNTER — Telehealth: Payer: Self-pay | Admitting: Family Medicine

## 2023-01-13 DIAGNOSIS — E860 Dehydration: Secondary | ICD-10-CM | POA: Diagnosis not present

## 2023-01-13 DIAGNOSIS — C649 Malignant neoplasm of unspecified kidney, except renal pelvis: Secondary | ICD-10-CM | POA: Diagnosis not present

## 2023-01-13 DIAGNOSIS — R1310 Dysphagia, unspecified: Secondary | ICD-10-CM | POA: Diagnosis not present

## 2023-01-13 DIAGNOSIS — C159 Malignant neoplasm of esophagus, unspecified: Secondary | ICD-10-CM | POA: Diagnosis not present

## 2023-01-15 NOTE — Progress Notes (Signed)
  Care Coordination Note  01/15/2023 Name: Douglas [name] MRN: [id number] DOB: [birth year]  Douglas [name] is a 67 y.o. year old male who is a primary care patient of Copland, Gwenlyn Found, MD and is actively engaged with the care management team. I reached out to [name] by phone today to assist with re-scheduling a follow up visit with the RN Case Manager  Follow up plan: We have been unable to make contact with the patient for follow up.   Burman Nieves, CCMA Care Coordination Care Guide Direct Dial: [phone]

## 2023-01-18 NOTE — Telephone Encounter (Signed)
Please see below.

## 2023-01-18 NOTE — Telephone Encounter (Signed)
Called, shared my deepest condolences with Thayer Ohm.  Body is at [employer] and Louanne Skye, cremation planned Died at home at about 0400 today I will watch for death cert

## 2023-01-18 NOTE — Telephone Encounter (Signed)
FYI cousin of patient called to notify provider of passing of patient.

## 2023-01-18 DEATH — deceased

## 2023-01-30 NOTE — Progress Notes (Deleted)
CARDIOLOGY OFFICE NOTE  Date:  01/30/2023    Douglas [name] Date of Birth: 13-Nov-1955 Medical Record #[id number]  PCP:  Pearline Cables, MD  Cardiologist:  Eden Emms   History of Present Illness:  67 y.o. 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 [facility]  In clinical trials. Has been on Tarceva and Avastin .  Also has a hx of ASCAD with STEMI with DES to proximal LAD 05/24/20 And residual 50% Ramus. EF was 35-40%. Due to CAD and DAT he was taken off his Avastin and Tarceva both contraindicated with ischemic heart disease. Started on [place] and coreg no aldactone due to soft BP, lack of volume overload and renal dx  MRI 08/27/20 E 31% LAD scar no mural apical thrombus  Eliquis stopped  Discussed possible need for AICD in future. Needs to f/u [facility] for alternative Rx cancer in light of his CAD and moderately reduced EF  09/09/20 Dr Ancil Linsey [phone] called directly to discuss She is contemplating using Nivolumab as single Agent immunoRx This drug has a small risk of myocarditis but is certainly safer than avastin and tarceva  09/23/20 spoke with Dr Hewitt Shorts at [facility] who is apparently involved now and starting Nivolumab every 2 weeks I told him we would plan on f/u Cardiac MRI June   Has now started Nivolumab/Opdivo has more congestion and MSK pains on it Noted 21-42% incidence of this side effect with drug Also more GERD. Discussed being ok to take any 2 nd generation antihistamine with sudafed  if needed   TTE reviewed 10/04/20 EF 35-40%  MRI 12/01/20 reviewed EF 39% anterior/septal/apical scar   Seen by Dr Mayford Knife  04/12/21 for chest pain Atypical Myovue  04/21/21 with no ischemia LAD infarct EF 42%  Lots of joint pain from optivo Trying to get in phase one trial at [facility] for new Rx since his current Rx is not working well with more abdominal /liver growth  12/28/21 Had open left inguinal hernia repair with mesh by Dr Magnus Ivan under  general anesthesia with no cardiac complications Did indicate issues with infection and use of "glue" that he is intolerant to  Has lost a lot of weight and cancer spreading Oncology trying to see if his tumor biomarkers cross react with other cancers to use alternative agents. Given situation we discussed going back to Avastin and Tarceva despite cardiac risks   Unfortunately now has been diagnosed with esophageal adenocarcinoma with obstructing mass post G tube placement Getting chemo at [facility] with Dr Gypsy Lore He has extensive liver mets as well  Seeing oncology at [facility] but G tube feeds hard and weight still an issue  Past Medical History:  Diagnosis Date   Allergic rhinitis    Anxiety    Arthritis    "all over my body" (10/16/2013)   Cataract    Chronic back pain    "neck to lower back" (10/16/2013)   Colon polyps    Compression fracture    S/P L-SPINE; pt does not recall this hx on 10/16/2013   COPD (chronic obstructive pulmonary disease) (HCC)    FVC .66   Coronary artery disease    Depression    "situational since 09/22/2013 OR"   Encounter for long-term (current) use of other medications    Esophageal cancer (HCC)    GERD (gastroesophageal reflux disease)    Hemoptysis    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)    History of blood transfusion 09/2013   "  think so; not sure; related to big OR"   History of surgical fusion joint    DDD C-SPINE   Hx of blood clots    Hyperlipidemia    Hypertension    Hypertensive retinopathy    Leiomyoma OF SKIN   INCREASED RISK RENAL CELL CA  NEEDS CT OF KIDNEYS EVERY 2 YEARS NEXT DUE  02/06/13   Melanoma in situ (HCC)    Melanoma of back (HCC)    Neuromuscular disorder (HCC)    Osteoarthritis    Renal calculi    Renal cell carcinoma (HCC)    type 2; papillary (metastized to bone)   Tobacco use disorder    Unspecified vitamin D deficiency     Past Surgical History:  Procedure Laterality Date   ANTERIOR  CERVICAL DECOMP/DISCECTOMY FUSION  1992   ANTERIOR CRUCIATE LIGAMENT REPAIR Bilateral [phone]   APPENDECTOMY  09/22/2013   CHOLECYSTECTOMY  09/22/2013   "had tumors in it"   CORONARY STENT INTERVENTION N/A 05/24/2020   Procedure: CORONARY STENT INTERVENTION;  Surgeon: [place], Letroy Vazguez M, MD;  Location: [facility] INVASIVE CV LAB;  Service: Cardiovascular;  Laterality: N/A;   HAND LIGAMENT RECONSTRUCTION Right    HARVEST BONE GRAFT Right 1992   "hip; for neck fusion"   INGUINAL HERNIA REPAIR Right 2004   INGUINAL HERNIA REPAIR Left 12/28/2021   Procedure: OPEN LEFT INGUINAL HERNIA REPAIR;  Surgeon: Abigail Miyamoto, MD;  Location: [facility] OR;  Service: General;  Laterality: Left;   INSERTION OF MESH Left 12/28/2021   Procedure: INSERTION OF MESH;  Surgeon: Abigail Miyamoto, MD;  Location: [facility] OR;  Service: General;  Laterality: Left;   IR GASTROSTOMY TUBE MOD SED  07/13/2022   IR PATIENT EVAL TECH 0-60 MINS  07/14/2022   IR PATIENT EVAL TECH 0-60 MINS  12/25/2022   IR REPLC GASTRO/COLONIC TUBE PERCUT W/FLUORO  12/12/2022   LEFT HEART CATH AND CORONARY ANGIOGRAPHY N/A 05/24/2020   Procedure: LEFT HEART CATH AND CORONARY ANGIOGRAPHY;  Surgeon: [place], Paden Senger M, MD;  Location: [facility] INVASIVE CV LAB;  Service: Cardiovascular;  Laterality: N/A;   LIGAMENT REPAIR Right ~ 2001   "little finger"   LYMPH NODE DISSECTION Right 09/22/2013   "in the area of the kidney; they were clean"   MELANOMA EXCISION  ~ 2007   "off my back"   PARTIAL NEPHRECTOMY Right 09/22/2013   removal of fascia overlying the right psoas Right 09/22/2013   RENAL ARTERY STENT Right 09/22/2013   RIGHT COLECTOMY Right 09/22/2013   ROBOTIC ASSITED PARTIAL NEPHRECTOMY  05/22/2012   Procedure: ROBOTIC ASSITED PARTIAL NEPHRECTOMY;  Surgeon: Sebastian Ache, MD;  Location: [facility] ORS;  Service: Urology;  Laterality: Right;  Right Robotic Cyst Decortication and Partial Nephrectomy      Medications: No outpatient medications have been  marked as taking for the 02/08/23 encounter (Appointment) with Wendall Stade, MD.     Allergies: Allergies  Allergen Reactions   Avelox [Moxifloxacin Hcl In Nacl] Hives, Shortness Of Breath and Swelling   Enbucrilate Hives and Rash    DERMABOND   Tape Dermatitis and Rash    Just the glue    Social History: The patient  reports that he quit smoking about 7 years ago. His smoking use included cigarettes. He started smoking about 47 years ago. He has a 20 pack-year smoking history. He has never used smokeless tobacco. He reports that he does not currently use alcohol. He reports current drug use. Drug: Marijuana.   Family History: The  patient's family history includes Blindness in his mother; Glaucoma in his mother; Hypertension in his father. He was adopted.   Review of Systems: Please see the history of present illness.   All other systems are reviewed and negative.   Physical Exam: VS:  There were no vitals taken for this visit. Marland Kitchen  BMI There is no height or weight on file to calculate BMI.  Wt Readings from Last 3 Encounters:  01/03/23 121 lb (54.9 kg)  12/13/22 136 lb 2 oz (61.7 kg)  07/28/22 136 lb (61.7 kg)   Affect appropriate Healthy:  appears stated age HEENT: normal Neck supple with no adenopathy JVP normal no bruits no thyromegaly Lungs clear with no wheezing and good diaphragmatic motion Heart:  S1/S2 no murmur, no rub, gallop or click PMI normal Abdomen: benighn, BS positve, no tenderness, no AAA no bruit.  No HSM or HJR post left inguinal hernia repair  Distal pulses intact with no bruits No edema Neuro non-focal Skin warm and dry No muscular weakness  LABORATORY DATA:  EKG:   performed today and showed NSR with no ST changes. 01/30/2023 SR rate 80 old anterolateral MI   Lab Results  Component Value Date   WBC 8.1 12/14/2022   HGB 14.9 12/14/2022   HCT 44.2 12/14/2022   PLT 277 12/14/2022   GLUCOSE 69 (L) 12/14/2022   CHOL 146 06/02/2020   TRIG  244 (H) 06/02/2020   HDL 50 06/02/2020   LDLDIRECT 63.0 05/01/2019   LDLCALC 57 06/02/2020   ALT 23 12/14/2022   AST 52 (H) 12/14/2022   NA 131 (L) 12/14/2022   K 5.4 (H) 12/14/2022   CL 99 12/14/2022   CREATININE 0.99 12/14/2022   BUN 45 (H) 12/14/2022   CO2 23 12/14/2022   TSH 1.376 05/24/2020   PSA 1.86 04/14/2020   INR 1.0 07/13/2022   HGBA1C 5.2 05/29/2016     BNP (last 3 results) Recent Labs    12/14/22 1925  BNP 654.9*    ProBNP (last 3 results) No results for input(s): "PROBNP" in the last 8760 hours.   Other Studies Reviewed Today:  Cardiac cath 05/24/20  Prox LAD lesion is 90% stenosed. Ramus lesion is 50% stenosed. Mid RCA lesion is 35% stenosed. Post intervention, there is a 0% residual stenosis. A drug-eluting stent was successfully placed using a STENT RESOLUTE ONYX 3.0X22. LV end diastolic pressure is normal.   1. Single vessel occlusive CAD involving the proximal LAD 2. Normal LVEDP 3. Successful PCI of the proximal LAD with DES x 1   Plan: DAPT for one year. Risk factor modification. Will assess LV function with Echo.    Echo Impression 05/25/20   1. Left ventricular ejection fraction, by estimation, is 35 to 40%. The  left ventricle has moderately decreased function. The left ventricle has  no regional wall motion abnormalities. Left ventricular diastolic  parameters are consistent with Grade I  diastolic dysfunction (impaired relaxation). There is mild apical  dyskinesis. There is severe hypokinesis of the entire anterior septum, but  the basal and mid segments of the anterior wall have preserved  contractility.   2. Right ventricular systolic function is normal. The right ventricular  size is normal.   3. The mitral valve is normal in structure. No evidence of mitral valve  regurgitation. No evidence of mitral stenosis.   4. The aortic valve is normal in structure. Aortic valve regurgitation is  not visualized. No aortic stenosis is  present.   5.  The inferior vena cava is normal in size with greater than 50%  respiratory variability, suggesting right atrial pressure of 3 mmHg.    Assessment/Plan: 1. STEMI -  05/24/20 with one vessel LAD CAD - s/p PCI  - ok to rx with just ASA at this time  - myovue no ischemia  04/21/21    2. ICM  - chronic systolic HF  -Continue prescription drug management with Entresto 24-26 mg twice daily, carvedilol 3.125 mg twice daily  -EF 39% by MRI 12/01/20 with no evidence Of myocarditis outside anterior infarcted region  -he does not appear volume overloaded on exam today - EF 42% by [facility] 04/21/21  - Per Dr Lalla Brothers deferred AICD consideration given cancer   3. HLD  - he is on crestor LDL at goal   4. Elevated LFTs  - resolved normal 06/02/20 likely related to acute MI  5. Hereditary leiomyomatosis and renal cell cancer with mets  - Seeing Dr Hewitt Shorts at [facility] started on Nivolumab every 2 weeks - Main cardiac side effects myocarditis Derred surveillance colonoscopy with Dr Adela Lank history of polyps  Has had significant bone and liver mets in past and current Rx not working as well Contacted [facility] to see if he can get in phase one trial or biomarkers for alternative Rx If not would be reasonable to go back to Traceva and Avastin despite cardiac risks as he is slowly dying of his spreading cancer with weight loss , poor functional status and spreading mets on imaging studies   6. Esophageal Cancer:  G tube in place less pain f/u oncology in [place]   Would appear that [place] is heading toward palliative type care due to his multiple malignancies Cardiac status is stable   Disposition:   FU 6 months   Time:  spent reviewing chart MRI/myovue discussing cancer Rx and [facility] referral patient interview and composing note 60 minutes    Signed: Charlton Haws, MD  01/30/2023 2:25 PM  [facility] [address] [place], [place]  [address] Phone: [phone] Fax: [phone]

## 2023-01-31 ENCOUNTER — Other Ambulatory Visit: Payer: Self-pay | Admitting: Cardiovascular Disease

## 2023-02-02 ENCOUNTER — Ambulatory Visit: Payer: Medicare HMO | Admitting: Cardiovascular Disease

## 2023-02-08 ENCOUNTER — Ambulatory Visit: Payer: Medicare HMO | Admitting: Cardiovascular Disease

## 2023-03-08 ENCOUNTER — Other Ambulatory Visit: Payer: Self-pay | Admitting: Family Medicine

## 2023-06-04 ENCOUNTER — Encounter (INDEPENDENT_AMBULATORY_CARE_PROVIDER_SITE_OTHER): Payer: Medicare HMO | Admitting: Ophthalmology

## 2023-06-11 ENCOUNTER — Other Ambulatory Visit (HOSPITAL_COMMUNITY): Payer: Medicare HMO
